# Patient Record
Sex: Female | Born: 1955 | Race: Black or African American | Hispanic: No | Smoking: Former smoker
Health system: Southern US, Community
[De-identification: ages and names within clinical notes are randomized; demographics above are authoritative.]

## PROBLEM LIST (undated history)

## (undated) DIAGNOSIS — E785 Hyperlipidemia, unspecified: Secondary | ICD-10-CM

## (undated) DIAGNOSIS — M5430 Sciatica, unspecified side: Secondary | ICD-10-CM

## (undated) DIAGNOSIS — D649 Anemia, unspecified: Secondary | ICD-10-CM

## (undated) DIAGNOSIS — I10 Essential (primary) hypertension: Secondary | ICD-10-CM

## (undated) DIAGNOSIS — M545 Low back pain, unspecified: Secondary | ICD-10-CM

## (undated) DIAGNOSIS — M533 Sacrococcygeal disorders, not elsewhere classified: Secondary | ICD-10-CM

## (undated) DIAGNOSIS — K5090 Crohn's disease, unspecified, without complications: Secondary | ICD-10-CM

## (undated) DIAGNOSIS — M961 Postlaminectomy syndrome, not elsewhere classified: Secondary | ICD-10-CM

## (undated) DIAGNOSIS — K559 Vascular disorder of intestine, unspecified: Secondary | ICD-10-CM

## (undated) DIAGNOSIS — M5417 Radiculopathy, lumbosacral region: Secondary | ICD-10-CM

## (undated) DIAGNOSIS — Z5189 Encounter for other specified aftercare: Secondary | ICD-10-CM

## (undated) HISTORY — DX: Postlaminectomy syndrome, not elsewhere classified: M96.1

## (undated) HISTORY — PX: REDUCTION MAMMAPLASTY: SUR839

## (undated) HISTORY — DX: Low back pain: M54.5

## (undated) HISTORY — PX: ABDOMINAL HYSTERECTOMY: SHX81

## (undated) HISTORY — DX: Anemia, unspecified: D64.9

## (undated) HISTORY — PX: BREAST BIOPSY: SHX20

## (undated) HISTORY — DX: Radiculopathy, lumbosacral region: M54.17

## (undated) HISTORY — DX: Encounter for other specified aftercare: Z51.89

## (undated) HISTORY — PX: SPINE SURGERY: SHX786

## (undated) HISTORY — DX: Hyperlipidemia, unspecified: E78.5

## (undated) HISTORY — DX: Sacrococcygeal disorders, not elsewhere classified: M53.3

## (undated) HISTORY — PX: CARDIAC CATHETERIZATION: SHX172

## (undated) HISTORY — DX: Essential (primary) hypertension: I10

## (undated) HISTORY — DX: Sciatica, unspecified side: M54.30

## (undated) HISTORY — DX: Crohn's disease, unspecified, without complications: K50.90

## (undated) HISTORY — DX: Vascular disorder of intestine, unspecified: K55.9

## (undated) HISTORY — DX: Low back pain, unspecified: M54.50

---

## 1980-06-20 HISTORY — PX: ABDOMINAL HYSTERECTOMY: SHX81

## 1982-06-20 HISTORY — PX: BREAST REDUCTION SURGERY: SHX8

## 1990-06-20 HISTORY — PX: OTHER SURGICAL HISTORY: SHX169

## 1997-10-03 ENCOUNTER — Emergency Department (HOSPITAL_COMMUNITY): Admission: EM | Admit: 1997-10-03 | Discharge: 1997-10-03 | Payer: Self-pay | Admitting: Emergency Medicine

## 1997-10-21 ENCOUNTER — Ambulatory Visit (HOSPITAL_COMMUNITY): Admission: RE | Admit: 1997-10-21 | Discharge: 1997-10-21 | Payer: Self-pay | Admitting: Cardiology

## 1997-11-03 ENCOUNTER — Emergency Department (HOSPITAL_COMMUNITY): Admission: EM | Admit: 1997-11-03 | Discharge: 1997-11-03 | Payer: Self-pay | Admitting: *Deleted

## 2003-05-18 ENCOUNTER — Other Ambulatory Visit: Payer: Self-pay

## 2005-04-06 ENCOUNTER — Ambulatory Visit: Payer: Self-pay | Admitting: Internal Medicine

## 2005-06-03 ENCOUNTER — Encounter: Payer: Self-pay | Admitting: Internal Medicine

## 2005-09-22 ENCOUNTER — Ambulatory Visit: Payer: Self-pay | Admitting: Internal Medicine

## 2006-01-28 ENCOUNTER — Emergency Department (HOSPITAL_COMMUNITY): Admission: EM | Admit: 2006-01-28 | Discharge: 2006-01-28 | Payer: Self-pay | Admitting: Emergency Medicine

## 2006-03-12 ENCOUNTER — Emergency Department: Payer: Self-pay | Admitting: Internal Medicine

## 2006-04-21 ENCOUNTER — Emergency Department (HOSPITAL_COMMUNITY): Admission: EM | Admit: 2006-04-21 | Discharge: 2006-04-21 | Payer: Self-pay | Admitting: Emergency Medicine

## 2006-05-28 ENCOUNTER — Emergency Department: Payer: Self-pay | Admitting: Emergency Medicine

## 2006-06-25 ENCOUNTER — Emergency Department: Payer: Self-pay | Admitting: General Practice

## 2006-07-18 ENCOUNTER — Emergency Department (HOSPITAL_COMMUNITY): Admission: EM | Admit: 2006-07-18 | Discharge: 2006-07-18 | Payer: Self-pay | Admitting: Emergency Medicine

## 2006-09-04 ENCOUNTER — Inpatient Hospital Stay (HOSPITAL_COMMUNITY): Admission: RE | Admit: 2006-09-04 | Discharge: 2006-09-09 | Payer: Self-pay | Admitting: Orthopaedic Surgery

## 2006-09-20 ENCOUNTER — Ambulatory Visit: Admission: RE | Admit: 2006-09-20 | Discharge: 2006-09-20 | Payer: Self-pay | Admitting: Orthopaedic Surgery

## 2007-03-29 ENCOUNTER — Encounter: Admission: RE | Admit: 2007-03-29 | Discharge: 2007-03-29 | Payer: Self-pay | Admitting: Orthopaedic Surgery

## 2007-07-18 ENCOUNTER — Inpatient Hospital Stay (HOSPITAL_COMMUNITY): Admission: RE | Admit: 2007-07-18 | Discharge: 2007-07-23 | Payer: Self-pay | Admitting: Orthopaedic Surgery

## 2007-11-05 ENCOUNTER — Encounter: Admission: RE | Admit: 2007-11-05 | Discharge: 2007-12-24 | Payer: Self-pay | Admitting: Orthopaedic Surgery

## 2008-01-09 ENCOUNTER — Encounter: Admission: RE | Admit: 2008-01-09 | Discharge: 2008-03-11 | Payer: Self-pay | Admitting: Orthopaedic Surgery

## 2008-07-24 ENCOUNTER — Encounter: Admission: RE | Admit: 2008-07-24 | Discharge: 2008-07-24 | Payer: Self-pay | Admitting: Orthopaedic Surgery

## 2008-08-21 ENCOUNTER — Ambulatory Visit: Payer: Self-pay | Admitting: Internal Medicine

## 2008-08-21 ENCOUNTER — Inpatient Hospital Stay (HOSPITAL_COMMUNITY): Admission: EM | Admit: 2008-08-21 | Discharge: 2008-08-22 | Payer: Self-pay | Admitting: *Deleted

## 2008-08-21 ENCOUNTER — Encounter (INDEPENDENT_AMBULATORY_CARE_PROVIDER_SITE_OTHER): Payer: Self-pay | Admitting: *Deleted

## 2008-08-22 ENCOUNTER — Encounter: Payer: Self-pay | Admitting: Internal Medicine

## 2008-09-02 ENCOUNTER — Ambulatory Visit: Payer: Self-pay | Admitting: Internal Medicine

## 2008-09-02 DIAGNOSIS — M129 Arthropathy, unspecified: Secondary | ICD-10-CM | POA: Insufficient documentation

## 2008-09-02 DIAGNOSIS — K559 Vascular disorder of intestine, unspecified: Secondary | ICD-10-CM | POA: Insufficient documentation

## 2008-09-02 DIAGNOSIS — I10 Essential (primary) hypertension: Secondary | ICD-10-CM | POA: Insufficient documentation

## 2008-09-02 DIAGNOSIS — K5090 Crohn's disease, unspecified, without complications: Secondary | ICD-10-CM

## 2008-12-26 ENCOUNTER — Encounter
Admission: RE | Admit: 2008-12-26 | Discharge: 2009-03-26 | Payer: Self-pay | Admitting: Physical Medicine & Rehabilitation

## 2008-12-30 ENCOUNTER — Ambulatory Visit: Payer: Self-pay | Admitting: Physical Medicine & Rehabilitation

## 2009-02-05 ENCOUNTER — Ambulatory Visit: Payer: Self-pay | Admitting: Physical Medicine & Rehabilitation

## 2009-03-06 ENCOUNTER — Ambulatory Visit: Payer: Self-pay | Admitting: Physical Medicine & Rehabilitation

## 2009-03-17 ENCOUNTER — Ambulatory Visit: Payer: Self-pay | Admitting: Physical Medicine & Rehabilitation

## 2009-04-01 ENCOUNTER — Encounter
Admission: RE | Admit: 2009-04-01 | Discharge: 2009-06-11 | Payer: Self-pay | Admitting: Physical Medicine & Rehabilitation

## 2009-04-03 ENCOUNTER — Ambulatory Visit: Payer: Self-pay | Admitting: Physical Medicine & Rehabilitation

## 2009-05-01 ENCOUNTER — Ambulatory Visit: Payer: Self-pay | Admitting: Physical Medicine & Rehabilitation

## 2009-05-28 ENCOUNTER — Ambulatory Visit: Payer: Self-pay | Admitting: Physical Medicine & Rehabilitation

## 2009-06-24 ENCOUNTER — Encounter
Admission: RE | Admit: 2009-06-24 | Discharge: 2009-09-22 | Payer: Self-pay | Admitting: Physical Medicine & Rehabilitation

## 2009-06-25 ENCOUNTER — Ambulatory Visit: Payer: Self-pay | Admitting: Physical Medicine & Rehabilitation

## 2009-07-22 ENCOUNTER — Ambulatory Visit: Payer: Self-pay | Admitting: Physical Medicine & Rehabilitation

## 2009-08-19 ENCOUNTER — Ambulatory Visit: Payer: Self-pay | Admitting: Physical Medicine & Rehabilitation

## 2009-09-18 ENCOUNTER — Encounter
Admission: RE | Admit: 2009-09-18 | Discharge: 2009-12-17 | Payer: Self-pay | Admitting: Physical Medicine & Rehabilitation

## 2009-09-18 ENCOUNTER — Emergency Department (HOSPITAL_COMMUNITY): Admission: EM | Admit: 2009-09-18 | Discharge: 2009-09-18 | Payer: Self-pay | Admitting: Emergency Medicine

## 2009-09-22 ENCOUNTER — Ambulatory Visit: Payer: Self-pay | Admitting: Physical Medicine & Rehabilitation

## 2009-10-12 ENCOUNTER — Ambulatory Visit (HOSPITAL_COMMUNITY)
Admission: RE | Admit: 2009-10-12 | Discharge: 2009-10-12 | Payer: Self-pay | Admitting: Physical Medicine & Rehabilitation

## 2009-10-23 ENCOUNTER — Ambulatory Visit: Payer: Self-pay | Admitting: Physical Medicine & Rehabilitation

## 2009-11-12 ENCOUNTER — Ambulatory Visit: Payer: Self-pay | Admitting: Physical Medicine & Rehabilitation

## 2009-12-14 ENCOUNTER — Encounter
Admission: RE | Admit: 2009-12-14 | Discharge: 2010-03-14 | Payer: Self-pay | Admitting: Physical Medicine & Rehabilitation

## 2009-12-22 ENCOUNTER — Ambulatory Visit: Payer: Self-pay | Admitting: Physical Medicine & Rehabilitation

## 2009-12-31 ENCOUNTER — Encounter: Admission: RE | Admit: 2009-12-31 | Discharge: 2009-12-31 | Payer: Self-pay | Admitting: Orthopaedic Surgery

## 2010-01-13 ENCOUNTER — Observation Stay (HOSPITAL_COMMUNITY): Admission: RE | Admit: 2010-01-13 | Discharge: 2010-01-14 | Payer: Self-pay | Admitting: Orthopaedic Surgery

## 2010-01-20 ENCOUNTER — Ambulatory Visit: Payer: Self-pay | Admitting: Physical Medicine & Rehabilitation

## 2010-02-17 ENCOUNTER — Ambulatory Visit: Payer: Self-pay | Admitting: Physical Medicine & Rehabilitation

## 2010-03-17 ENCOUNTER — Encounter
Admission: RE | Admit: 2010-03-17 | Discharge: 2010-06-15 | Payer: Self-pay | Source: Home / Self Care | Attending: Physical Medicine & Rehabilitation | Admitting: Physical Medicine & Rehabilitation

## 2010-03-23 ENCOUNTER — Ambulatory Visit: Payer: Self-pay | Admitting: Physical Medicine & Rehabilitation

## 2010-04-21 ENCOUNTER — Ambulatory Visit: Payer: Self-pay | Admitting: Physical Medicine & Rehabilitation

## 2010-05-19 ENCOUNTER — Ambulatory Visit: Payer: Self-pay | Admitting: Physical Medicine & Rehabilitation

## 2010-06-25 ENCOUNTER — Encounter
Admission: RE | Admit: 2010-06-25 | Discharge: 2010-06-29 | Payer: Self-pay | Source: Home / Self Care | Attending: Physical Medicine & Rehabilitation | Admitting: Physical Medicine & Rehabilitation

## 2010-06-29 ENCOUNTER — Ambulatory Visit
Admission: RE | Admit: 2010-06-29 | Discharge: 2010-06-29 | Payer: Self-pay | Source: Home / Self Care | Attending: Physical Medicine & Rehabilitation | Admitting: Physical Medicine & Rehabilitation

## 2010-08-02 ENCOUNTER — Encounter: Payer: Medicare Other | Attending: Physical Medicine & Rehabilitation

## 2010-08-02 ENCOUNTER — Ambulatory Visit: Payer: Medicare Other | Admitting: Physical Medicine & Rehabilitation

## 2010-08-02 DIAGNOSIS — M533 Sacrococcygeal disorders, not elsewhere classified: Secondary | ICD-10-CM

## 2010-08-02 DIAGNOSIS — Z981 Arthrodesis status: Secondary | ICD-10-CM | POA: Insufficient documentation

## 2010-08-05 NOTE — Procedures (Signed)
NAMESHAMBRIA, CAMERER            ACCOUNT NO.:  [id number]  MEDICAL RECORD NO.:  [id number]           PATIENT TYPE:  LOCATION:                                 FACILITY:  PHYSICIAN:  Erick Colace, M.D.DATE OF BIRTH:  12-22-1955  DATE OF PROCEDURE: DATE OF DISCHARGE:                              OPERATIVE REPORT  SACROILIAC INJECTION UNDER FLUOROSCOPIC GUIDANCE  INDICATION:  Right sacroiliac disorder, status post lumbar fusion.  The patient has had previous good success with this injection last performed on February 05, 2009.  She has had exacerbation of right buttock pain which was not relieved with trochanteric bursa injection.  Informed consent was obtained after describing risks and benefits of the procedure with the patient.  These include bleeding, bruising, and infection.  She elects to proceed and has given written consent.  The patient was placed prone on fluoroscopy table, Betadine prep, sterile drape, 25-gauge inch and half needle was used to anesthetize the skin and subcu tissue, 1% lidocaine x2 mL.  Then, a 25 gauge 3.5-inch spinal needle was inserted into right SI joint, AP, lateral, oblique imaging. Omnipaque 180 under live fluoro demonstrated no intravascular uptake followed by injection of 1 mL of 2% lidocaine plus 0.5 ML of 40 mg/mL Depo-Medrol.  The patient tolerated the procedure well.  Postprocedure instructions were given.  She will see me back in 1 month followup visit.  We will discuss medication management should this not prove to be successful.     Erick Colace, M.D. Electronically Signed    AEK/MEDQ  D:  08/02/2010 10:35:01  T:  08/02/2010 20:06:09  Job:  [id number]

## 2010-09-03 ENCOUNTER — Encounter: Payer: Medicare Other | Attending: Physical Medicine & Rehabilitation

## 2010-09-03 ENCOUNTER — Ambulatory Visit: Payer: Medicare Other | Admitting: Physical Medicine & Rehabilitation

## 2010-09-03 DIAGNOSIS — M533 Sacrococcygeal disorders, not elsewhere classified: Secondary | ICD-10-CM

## 2010-09-03 DIAGNOSIS — Z981 Arthrodesis status: Secondary | ICD-10-CM | POA: Insufficient documentation

## 2010-09-03 DIAGNOSIS — M961 Postlaminectomy syndrome, not elsewhere classified: Secondary | ICD-10-CM

## 2010-09-04 LAB — DIFFERENTIAL
Eosinophils Relative: 2 % (ref 0–5)
Lymphocytes Relative: 36 % (ref 12–46)
Lymphs Abs: 1.6 10*3/uL (ref 0.7–4.0)
Monocytes Absolute: 0.3 10*3/uL (ref 0.1–1.0)

## 2010-09-04 LAB — CBC
MCH: 28.5 pg (ref 26.0–34.0)
MCHC: 32.8 g/dL (ref 30.0–36.0)
MCV: 86.9 fL (ref 78.0–100.0)
Platelets: 234 10*3/uL (ref 150–400)
RDW: 13.6 % (ref 11.5–15.5)
WBC: 4.5 10*3/uL (ref 4.0–10.5)

## 2010-09-04 LAB — COMPREHENSIVE METABOLIC PANEL
ALT: 20 U/L (ref 0–35)
Albumin: 4.1 g/dL (ref 3.5–5.2)
Alkaline Phosphatase: 52 U/L (ref 39–117)
BUN: 9 mg/dL (ref 6–23)
CO2: 24 mEq/L (ref 19–32)
Calcium: 9.3 mg/dL (ref 8.4–10.5)
Chloride: 105 mEq/L (ref 96–112)
GFR calc Af Amer: >60 mL/min (ref >60)
GFR calc non Af Amer: >60 mL/min (ref >60)
Potassium: 3.1 mEq/L — ABNORMAL LOW (ref 3.5–5.1)
Sodium: 137 mEq/L (ref 135–145)
Total Bilirubin: 0.7 mg/dL (ref 0.3–1.2)

## 2010-09-04 LAB — SURGICAL PCR SCREEN: MRSA, PCR: NEGATIVE

## 2010-09-30 LAB — CBC
HCT: 39.7 % (ref 36.0–46.0)
Hemoglobin: 13.4 g/dL (ref 12.0–15.0)
Platelets: 223 10*3/uL (ref 150–400)
Platelets: ADEQUATE 10*3/uL (ref 150–400)
RDW: 13.5 % (ref 11.5–15.5)
WBC: 12.5 10*3/uL — ABNORMAL HIGH (ref 4.0–10.5)
WBC: 16 10*3/uL — ABNORMAL HIGH (ref 4.0–10.5)

## 2010-09-30 LAB — URINALYSIS, ROUTINE W REFLEX MICROSCOPIC
Bilirubin Urine: NEGATIVE
Glucose, UA: NEGATIVE mg/dL
Ketones, ur: NEGATIVE mg/dL
Protein, ur: NEGATIVE mg/dL
pH: 6.5 (ref 5.0–8.0)

## 2010-09-30 LAB — GIARDIA/CRYPTOSPORIDIUM SCREEN(EIA)
Cryptosporidium Screen (EIA): NEGATIVE
Giardia Screen - EIA: NEGATIVE

## 2010-09-30 LAB — COMPREHENSIVE METABOLIC PANEL
ALT: 14 U/L (ref 0–35)
Albumin: 4 g/dL (ref 3.5–5.2)
Alkaline Phosphatase: 49 U/L (ref 39–117)
BUN: 13 mg/dL (ref 6–23)
Chloride: 99 mEq/L (ref 96–112)
Glucose, Bld: 119 mg/dL — ABNORMAL HIGH (ref 70–99)
Potassium: 3.7 mEq/L (ref 3.5–5.1)
Sodium: 139 mEq/L (ref 135–145)
Total Bilirubin: 0.4 mg/dL (ref 0.3–1.2)

## 2010-09-30 LAB — STOOL CULTURE

## 2010-09-30 LAB — PHOSPHORUS: Phosphorus: 3.6 mg/dL (ref 2.3–4.6)

## 2010-09-30 LAB — BASIC METABOLIC PANEL
BUN: 6 mg/dL (ref 6–23)
Creatinine, Ser: 0.88 mg/dL (ref 0.4–1.2)
GFR calc non Af Amer: >60 mL/min (ref >60)
Glucose, Bld: 152 mg/dL — ABNORMAL HIGH (ref 70–99)

## 2010-09-30 LAB — MAGNESIUM: Magnesium: 2.1 mg/dL (ref 1.5–2.5)

## 2010-09-30 LAB — DIFFERENTIAL
Band Neutrophils: 0 % (ref 0–10)
Basophils Absolute: 0 10*3/uL (ref 0.0–0.1)
Basophils Relative: 0 % (ref 0–1)
Eosinophils Absolute: 0 10*3/uL (ref 0.0–0.7)
Eosinophils Relative: 0 % (ref 0–5)
Lymphocytes Relative: 7 % — ABNORMAL LOW (ref 12–46)
Lymphs Abs: 0.8 10*3/uL (ref 0.7–4.0)
Metamyelocytes Relative: 0 %
Myelocytes: 0 %
Neutrophils Relative %: 81 % — ABNORMAL HIGH (ref 43–77)
Neutrophils Relative %: 92 % — ABNORMAL HIGH (ref 43–77)
Promyelocytes Absolute: 0 %

## 2010-09-30 LAB — SEDIMENTATION RATE: Sed Rate: 16 mm/hr (ref 0–22)

## 2010-09-30 LAB — PROTIME-INR
INR: 1 (ref 0.00–1.49)
Prothrombin Time: 13.4 seconds (ref 11.6–15.2)

## 2010-09-30 LAB — LIPASE, BLOOD: Lipase: 23 U/L (ref 11–59)

## 2010-09-30 LAB — TSH: TSH: 0.622 u[IU]/mL (ref 0.350–4.500)

## 2010-09-30 LAB — APTT: aPTT: 31 seconds (ref 24–37)

## 2010-10-18 ENCOUNTER — Ambulatory Visit: Payer: Medicare Other | Admitting: Physical Medicine & Rehabilitation

## 2010-10-18 ENCOUNTER — Encounter: Payer: Medicare Other | Attending: Physical Medicine & Rehabilitation

## 2010-10-18 DIAGNOSIS — Z981 Arthrodesis status: Secondary | ICD-10-CM | POA: Insufficient documentation

## 2010-10-18 DIAGNOSIS — M961 Postlaminectomy syndrome, not elsewhere classified: Secondary | ICD-10-CM

## 2010-10-18 DIAGNOSIS — M545 Low back pain: Secondary | ICD-10-CM

## 2010-10-18 DIAGNOSIS — M533 Sacrococcygeal disorders, not elsewhere classified: Secondary | ICD-10-CM | POA: Insufficient documentation

## 2010-10-18 NOTE — Procedures (Signed)
[name] [name], [name] [name]            ACCOUNT NO.:  [id number]  MEDICAL RECORD NO.:  [id number]           PATIENT TYPE:  O  LOCATION:  [facility]PC                          FACILITY:  [facility]  PHYSICIAN:  Erick Colace, M.D.DATE OF BIRTH:  12-22-1955  DATE OF PROCEDURE: DATE OF DISCHARGE:                              OPERATIVE REPORT  PROCEDURE:  L5-S1, S2-S3 dorsal ramus radiofrequency neurotomy.  INDICATION:  Sacroiliac disorder, lumbar post laminectomy syndrome, chronic lumbar pain.  Informed consent was obtained after describing the risks and benefits of the procedure with patient.  These include bleeding, bruising, and infection.  She elects to proceed and has given written consent.  She has had good temporary relief with SI injection x2.  Informed consent was obtained after describing risks and benefits.  The patient placed in the prone position.  Area marked and prepped with Betadine, entered with 25-gauge inch and half needle, 1 mL of 1% lidocaine injected into each of four sites.  Then a 20-gauge 10-cm RF needle with 10-mm curved active tip was inserted under fluoroscopic guidance, targeting right S1 SAP sacral ala junction, bone contact made, confirmed with lateral imaging.  Sensory stem at 50 Hz followed by motor stem at 2 Hz confirmed proper needle location followed by injection of 1 mL of solution containing 1 mL of 4 mg/mL dexamethasone and 4 mL of 1% lidocaine.  Then the right S1 the lateral aspect of the foramen was approximated by needle tip.  Sensory testing performed at 50 Hz, 1 mL of dexamethasone lidocaine solution was injected followed by radiofrequency lesioning 70 degrees and 70 seconds.  Then the right S2 lateral aspect of the foramen was approximated by needle tip.  Sensory stem at 50 Hz followed by injection of 1 mL of the dexamethasone lidocaine solution and radiofrequency lesioning 70 degrees for 70 seconds.  Then the right S3 lateral aspect of the foramen was  approximated by a needle tip. Sensory stem performed at 50 Hz followed by injection of 1 mL of dexamethasone lidocaine solution and radiofrequency lesioning 70 degrees Celsius for 70 seconds.  The patient tolerated procedure well. Postprocedure instructions given.  We will see her back in about 6 weeks.  Medications written.  We will give Neurontin 1800 mg tonight.     Erick Colace, M.D. Electronically Signed    AEK/MEDQ  D:  10/18/2010 10:11:01  T:  10/18/2010 22:07:54  Job:  [id number]

## 2010-11-02 NOTE — Op Note (Signed)
NAMENEALA, [name] [name]            ACCOUNT NO.:  [id number]   MEDICAL RECORD NO.:  [id number]          PATIENT TYPE:  INP   LOCATION:  3006                         FACILITY:  [facility]   PHYSICIAN:  Mark C. Ophelia Charter, M.D.    DATE OF BIRTH:  07/18/1955   DATE OF PROCEDURE:  07/18/2007  DATE OF DISCHARGE:  07/23/2007                               OPERATIVE REPORT   This is a re-dictation of the operative note, the previous note was  dictated but has not been found in the system.   PREOPERATIVE DIAGNOSIS:  Status post L3-L4 and L4-L5 TLIF with  pseudoarthrosis and some cage back out.   POSTOPERATIVE DIAGNOSIS:  Status post L3-L4 and L4-L5 TLIF with  pseudoarthrosis and some cage back out.   PROCEDURE:  Revision TLIF right L3-L4 and L4-L5.   SURGEON:  Mark C. Ophelia Charter, M.D.   ASSISTANT:  Wende Neighbors, P.A.-C.   ANESTHESIA:  GOT.   ESTIMATED BLOOD LOSS:  650 mL.   HISTORY:  This patient has had a previous TLIF procedure.  She has worn  her brace as expected but has had some increased right leg pain and  serial x-rays show that the tiny BB type marker that is impregnated in  the plastic PEEK cage had showed back up and myelogram CT scan showed  that cage had backed up and was causing some nerve root compression with  increased pain.  She is brought in at this time for repositioning to  relieve her from the compression.   PROCEDURE IN DETAIL:  After the induction of general anesthesia with  orotracheal intubation, the patient was placed prone on the [place]  frame with the spinal frame.  Careful padding and positioning, prepping  and preoperative antibiotics.  Time out procedure was confirmed. A  sterile skin marker was used on the old incision and a Betadine Vidrape.  Arms were checked to make sure they were at the appropriate level.  Shoulders were abducted at 90 and elbows at 90 with the axillary fold  anterior to the shoulder was yellow pads rolled.  An old incision was  made.   Dissection down to hardware was performed out to the side.  Once  hardware was identified, dissection medially along the edge of the canal  was performed, nerve root and cages were identified.  The cages had  backed up and at this point, caps were removed on all sides.  The rod  was removed and there was distraction applied.  The cage was attempted  to be removed but the cages would not back out, whatsoever.  Trying to  go around the cages with an osteotome was performed and it would loosen  some, however, the teeth of the cage were designed to prevent back out  and although it had backed up some, it could not be removed.  With  efforts to remove the cage, the tiny device that screws into the middle  of the cage and small hole on the side which is threaded and is attached  to a flat pack was attempted to be backed up and it broke off.  The  metal was just underneath the edge of the surface of the cage, it was  not protruding and to remove it  would require a high speed drill bit  around the cage.  It was tightened and tapped down and it was secure.  At this point, it was apparent that the possibility of removing the cage  would cause extensive damage.  With distraction applied on both sides  between the pedicle screws, the cage was advanced forward.  Once it was  advanced in adequate position, checked under fluoroscopy, and under  direct visualization was out of the way, an identical procedure was  repeated at the other cage.  Rods were appropriately contoured and were  placed across.  A link here was placed.  AP and lateral fluoroscopic  pictures were taken after all caps had been placed, tightening down the  rods first on the sides prior to tightening the caps for compression.  The cages were checked again and were in excellent position.  The nerve  root was decompressed.  Foraminotomy was performed.  Bone graft was  inspected and additional graft was placed out laterally.  After a   careful irrigation and final spot pictures were taken, the wound was  essentially closed with subcuticular closure.  The patient tolerated the  procedure well.  It was noted in the recovery room that she had some arm  numbness of the right arm which had the tourniquet on it and also had  been carefully padded and positioned.  It was global weakness, mild,  inserted from the mid humeral level distal.  The whole hand had  decreased sensation, EPL, EDC, FDP, FDS, wrist flexion/extension, were  all 5/5 with motor strength testing.  I discussed with the patient in  the recovery room the broken extractor piece that was in the cage and  discussed with her it was tapped down threaded and would not back out  and did not require destruction of the cage in order to remove it.  Neurologic exam of the lower extremities was intact postoperatively and  good relief of her preop pain from pressure from the cage.      Mark C. Ophelia Charter, M.D.  Electronically Signed     MCY/MEDQ  D:  09/05/2007  T:  09/05/2007  Job:  [id number]

## 2010-11-02 NOTE — Procedures (Signed)
[name] [name], [name] [name]            ACCOUNT NO.:  [id number]   MEDICAL RECORD NO.:  [id number]           PATIENT TYPE:   LOCATION:                                 FACILITY:   PHYSICIAN:  Erick Colace, M.D.DATE OF BIRTH:  1956-02-21   DATE OF PROCEDURE:  DATE OF DISCHARGE:                               OPERATIVE REPORT   This is a right sacroiliac injection under fluoroscopic guidance.   INDICATIONS:  Right hip and buttock pain.  She has some relief with  troch bursa injection; however, she has had persistent pain, which  interferes with activity and is graded as 7/10 interfering with  household duties and shopping.   Informed consent was obtained after describing risks and benefits of the  injection with the patient, these include bleeding, bruising, infection.  She elects to proceed and has given written consent.  The patient placed  prone on fluoroscopy table.  Betadine prep, sterile drape, 25-gauge inch  and half needle was used to anesthetize skin and subcu tissue 1%  lidocaine x2 ccs and a 25-gauge 3-inch spinal needle was inserted under  fluoroscopic guidance charting the right SI joint.  AP and lateral  oblique images utilized, Omnipaque 180 and live fluoro demonstrated no  intravascular uptake, then 0.5 mL of 40 mg/mL Depo-Medrol and 1 mL 2%  MPF lidocaine injected.  The patient tolerated procedure well.  Pre and  post injection vitals stable.  Post injection instructions given.      Erick Colace, M.D.  Electronically Signed     AEK/MEDQ  D:  02/05/2009 13:06:54  T:  02/06/2009 00:42:19  Job:  [id number]

## 2010-11-02 NOTE — Op Note (Signed)
[name] [name], [name] [name]            ACCOUNT NO.:  [id number]   MEDICAL RECORD NO.:  [id number]          PATIENT TYPE:  INP   LOCATION:  2899                         FACILITY:  [facility]   PHYSICIAN:  Mark C. Ophelia Charter, M.D.    DATE OF BIRTH:  04-21-1956   DATE OF PROCEDURE:  07/18/2007  DATE OF DISCHARGE:                               OPERATIVE REPORT   PRE AND POSTOPERATIVE DIAGNOSIS:  Status post L3-4, L4-5 transforaminal  lumbar interbody fusion with pseudoarthrosis and cage back up with dural  and nerve root compression.   PROCEDURE:  Revision transforaminal lumbar interbody fusion L3-4, L4-5.  Rod cap exchange replacement of cages and recompression, bone  morphogenic protein lateral.Exploration of lumbar fusion.   SURGEON:  Annell Greening, MD.   ASSISTANT:  Maud Deed, PA-C   ESTIMATED BLOOD LOSS:  650 mL with retransfusion, Cell Saver 300.   DRAINS:  One Hemovac.   This 55 year old female has had previous L3-4, L4-5 TLIF for  degenerative spinal stenosis, foraminal stenosis.  Postoperatively one  of the caps popped off at the right L5 pedicle screw but the rod stable  within the slot in satisfactory position.  She was maintained in her  brace for four  months; however, with time she has had gradual migration  of her cage more at the 4-5 than the 3-4 level.  On the right side the  cage is backed up, was causing dural compression with increasing pain.  Myelo-CT scan showed that there was adequate bone anterior to cages  backed up more so at the 4-5 level than at the 3-4 level with some cage  backup, one of pieces of bone graft was pressed against the nerve root  which was not present in the immediate postop period.  She did well  postop with great relief of pain for a month and a half and then with  cage backup starting progressive increased pain.  She is brought back in  for either exchange or cages or reposition of cages to take care of the  nerve root compression.   PROCEDURE:   After induction of general anesthesia, orotracheal  intubation, the patient was placed on the Andrews frame with the spine  attachment, careful padding iliac crest, chest rolls, thigh pads,  pillows underneath the feet, anterior shoulder padding with the  shoulders and elbow at 90.  Back was prepped with DuraPrep.  Foley  catheter had been placed and 2 grams Ancef was given preoperatively.  Betadine Vi-drape and laminectomy sheet drape.  Time-out procedure was  taken and confirmed.  Antibiotics were in and the images were displayed.  Patient appropriate position.  Midline incision was made using the old  scar.  Dissection down to midline spinous process at L2 down at S1 was  performed and then out to the hardware laterally.  Chunks of soft tissue  were removed.  Hat band screw caps were loosened and removed.  Rod was  removed on the right and the cage at 4-5 was one that was causing most  significant compression.  It was exposed first identifying the pedicle  just below the  disk space, then follow up from the top of the pedicle to  the disk space identifying the nerve root above as it came underneath  the pedicle with careful protection.  Cage was identified, was  prominent, was sticking up and compressing the dura.  The dura, with  operating microscope was gently mobilized and carefully peeled off the  cage.  As the D'Errico was being placed slightly medial, dural tear  occurred anterior and patties were used to protect the dura.  With  distractor applied, on the right side, the PEEK cage remover was  attached to the small hole which is a tap-like device inserted down into  the PEEK cage and then I gently tried to tap it out with dura protected.  The remover broke off flush with the PEEK cage.  The cage would not back  out any and an osteotome had been used above and below the cage next to  the end plate prior to attempt to remove it.  A Kocher was then inserted  on  it and the patient  could almost be lifted up off the table with  motion at the __________  with attempts to wiggle the cage were  unsuccessful.  At this point it was apparent the cage was not coming  out.  The rod inserted metal piece could be taken out by burring away  the PEEK although this would cause metal debris and heat adjacent to the  dura.  It was snug inside the PEEK which expands as the tap is inserted  and at this point rather than exchanging the cage, the cage inserter was  used to impact the cage further until it was countersunk 5 to 6 mm,  checked on fluoroscopy, confirming it was in excellent position.  Identical procedure was then repeated at the level above with exposure  of the cage.  There was a piece of bone posterior to the cage which was  part of the bone graft that had worked around the cage which was removed  and the cage was angled but was just flush with the posterior cortex and  thin osteotome was placed above and below the cage gently and then the  cage was impacted, countersinking it tightly.  It also was tight and not  able to be removed.  Dura was intact at this level.  Some Tisseel was  mixed and squirted at the 4-5 level with the operative microscope and  the operative field was dry.  At the 3-4 level with the cage  repositioned AP and lateral fluoroscopy were used to confirm position of  the cage looking at three tiny dots that are in the cage, one above and  two below at the lower level both posterior dots were parallel and the  anterior aspect was just past the midline or midbody position posterior  to anterior with identical position at 3-4 and on AP x-ray both cages  were exactly midline.  Identical length rods were reinserted Biomet  Polaris and the right cage side was tightened first tightening down the  top first and then compressing the other two levels.  The opposite rod  was then compressed tightening the top and then using the rod compressor  to tighten down the  screws.  All six screw caps were carefully inspected  and were tight, all tightened to 100 pounds when they clicked and were  checked with a Glorious Peach to make sure that they were intact.  Next, the  migrated rod  cap which was at the midline was checked out on  fluoroscopy, localized with tonsil clamp.  The lamina below was  identified from the depth of the dura and then careful directly over the  cap was performed.  Once the cap was identified blue micro nerve hook  was placed underneath it as black nerve hook was gently brought around  cleaning off soft tissue until it popped out and was removed.  Final x-  rays were taken which showed good position of the cages, rods tightened  down.  BMP small kit was then cut into four pieces and one piece was  placed on the right at 3-4, right at 4-5 and then on the left at 3-4 and  left at 4-5.  Bone laterally had been inspected and on the right at 4-5  there was just trace motion.  3-4 appeared to be mobile with pressure  and on the left side there was abundant bone graft lateral where the BMP  was placed.  There was trace motion on the left side.  Cross connector  was reapplied large tightened down until it clicked.  Fascia was closed  0-0 Vicryl. Hemovac in the subcutaneous tissue.  2-0 Vicryl in  subcutaneous tissue and skin closure.  Patient had bone stimulator  applied and was transferred to recovery in stable condition.  Instrument  count needle count was correct.      Mark C. Ophelia Charter, M.D.  Electronically Signed     MCY/MEDQ  D:  07/18/2007  T:  07/19/2007  Job:  [id number]

## 2010-11-02 NOTE — Procedures (Signed)
[name] [name], [name] [name]            ACCOUNT NO.:  [id number]   MEDICAL RECORD NO.:  [id number]          PATIENT TYPE:  REC   LOCATION:  TPC                          FACILITY:  [facility]   PHYSICIAN:  Erick Colace, M.D.DATE OF BIRTH:  05/23/1956   DATE OF PROCEDURE:  DATE OF DISCHARGE:                               OPERATIVE REPORT   This is a right trochanteric bursa injection.   INDICATION:  Right trochanteric bursitis, pain only partially relieved  by medication management including narcotic analgesics.   Informed consent was obtained after describing risks and benefits of  procedure with the patient.  These include bleeding, bruising, or  infection.  She elected to proceed and has given a written consent.   The patient was placed in the left lateral decubitus position, area  marked and prepped with Betadine and alcohol, entered with 25-gauge 1-  1/2-inch needle after negative drawback for blood, 1 mL of 40 mg/mL Depo-  Medrol and 4 mL of 1% lidocaine injected.  The patient tolerated the  procedure well.  Pre and post injection vitals stable.  Post injection  instructions given.      Erick Colace, M.D.  Electronically Signed     AEK/MEDQ  D:  12/30/2008 16:45:48  T:  12/31/2008 08:07:09  Job:  [id number]

## 2010-11-02 NOTE — H&P (Signed)
[name] [name], TAKEDA            ACCOUNT NO.:  [id number]   MEDICAL RECORD NO.:  [id number]          PATIENT TYPE:  INP   LOCATION:  3729                         FACILITY:  [facility]   PHYSICIAN:  Lonia Blood, M.D.      DATE OF BIRTH:  [birth year]   DATE OF ADMISSION:  08/21/2008  DATE OF DISCHARGE:                              HISTORY & PHYSICAL   PRIMARY CARE PHYSICIAN:  Is in [facility].   PRESENTING COMPLAINTS:  Nausea, vomiting, abdominal pain.   HISTORY OF PRESENT ILLNESS:  The patient is a 55 year old [place]  American female with known history of Crohn disease diagnosed in 2006 at  [place].  The patient at that time was treated with steroid and  Remicade.  She has since been off treatment.  She apparently had been  doing alright until 2 days ago when she started having nausea, vomiting  and diarrhea.  Now, she has started having crampy abdominal pain.  The  diarrhea at some point was bloody and has persisted.  Hence, the patient  came to the emergency room.  Her abdominal pain is mainly in the  epigastric region.  No relation to meals and is crampy in nature.  She  had a similar episode when she first had her Crohn disease diagnosed  years ago.  Denied any fever no chills.  Denied any hematemesis.   PAST MEDICAL HISTORY:  1. Significant for Crohn disease.  2. Hypertension.  3. GERD.  4. Chronic bronchitis.  5. Severe degenerative disk disease status post L3-L4 and L4-L5 fusion      with migration cage in February 2009 when it was revised.   ALLERGIES:  She is allergic to ORUDIS.   MEDICATIONS INCLUDE:  1. Hydrochlorothiazide 25 mg daily.  2. Prempro 1 tablet daily.  3. Vicodin as needed.   SOCIAL HISTORY:  The patient lives in [place] with her partner.  She  denied tobacco, alcohol or IV drug use.   FAMILY HISTORY:  Mainly hypertension.   REVIEW OF SYSTEMS:  Fourteen-point review of systems is negative except  per HPI.   EXAMINATION:  Temperature is  98.7, blood pressure 130/56, pulse 56,  respiratory rate 18, saturation 100% on room air.  GENERAL:  The patient is awake, alert, oriented, in no acute distress.  HEENT:  PERRL.  EOMI.  NECK:  Supple.  No JVD, no lymphadenopathy.  RESPIRATORY:  She has good air entry bilaterally.  No wheezes or rales.  CARDIOVASCULAR SYSTEM:  The patient has S1-S2, no murmur.  ABDOMEN:  Soft, nontender, with positive bowel sounds.  EXTREMITIES:  No edema, cyanosis or clubbing.   Stool guaiac is positive.  Sodium is 139, potassium 3.7, chloride 99,  CO2 29, glucose 119, BUN 13, creatinine 0.85, calcium 9.8, total protein  7.6, albumin 4.0, AST 22, ALT 14.  Lipase is 23.  White count is 16,000,  hemoglobin 13.4 with an ANC of 89.3, platelets are clumps but counts  appeared adequate.  She had a left shift, neutrophils 81%.  CT abdomen  and pelvis shows no focal folliculosis or lymphadenopathy, scattered  atherosclerotic calcification, none in  the abdominal aorta.  Normal  terminal ileum also visualized.   ASSESSMENT:  Therefore, this is a 55 year old female with known history  of Crohn disease presenting with nausea, vomiting, diarrhea, and  findings consistent with possible flare of her Crohn's.   PLAN:  Therefore, will be:  1. Crohn disease flare:  Will admit the patient, start her on IV      steroids, bowel rest, pain control and IV fluids.  Once the      patient's symptoms improve, will get GI consult.  She may benefit      from EGD and colonoscopy specifically.  2. Nausea, vomiting, and diarrhea secondary to Crohn disease:  Will      treat that conservatively as indicated above.  3. Hypertension:  Will hold hydrochlorothiazide and, if needed, we      will put patient on some medications like beta blockers.  4. Gastroesophageal reflux disease:  I will continue PPI as IV.      Guaiac positive stools most likely from her Crohn flare.      Hemoglobin is stable, will continue to monitor hemoglobin.   5. History of degenerative disk disease:  Will continue with some pain      control as necessary.  Otherwise, further treatment will depend on      how the patient does in the hospital.      Lonia Blood, M.D.  Electronically Signed     LG/MEDQ  D:  08/21/2008  T:  08/21/2008  Job:  [id number]

## 2010-11-02 NOTE — Discharge Summary (Signed)
[name] [name], [name] [name]            ACCOUNT NO.:  [id number]   MEDICAL RECORD NO.:  [id number]          PATIENT TYPE:  INP   LOCATION:  3730                         FACILITY:  [facility]   PHYSICIAN:  [name] [name], M.D. DATE OF BIRTH:  December 30, 1955   DATE OF ADMISSION:  08/21/2008  DATE OF DISCHARGE:  08/22/2008                               DISCHARGE SUMMARY   DISCHARGE DIAGNOSES:  1. Hematochezia.  2. History of Crohn disease.  3. Hypertension.  4. Gastroesophageal reflux disease.   DISCHARGE MEDICATIONS:  1. Hydrochlorothiazide 25 mg daily.  2. Prempro 0.65 mg once daily.  3. Omeprazole 20 mg daily.  4. Vicodin 5/500 mg 1 tablet every 6 hours as needed for pain.  I have      dispensed #30 tablets.   DISPOSITION AND FOLLOWUP:  [name] [name] is discharged home in stable  condition.  Of note, she had a colonoscopy done, which biopsies are  still pending.  She has been scheduled to follow up with Dr. Lina [name]  on September 02, 2008, at 2:15 p.m. to follow up with the biopsy results..  The patient is also encouraged to call her PCP, Dr. Darliss [name] to  schedule an appointment within the next couple weeks as well.   CONSULTATION THIS HOSPITALIZATION:  Hedwig Morton. Juanda Chance, MD, with [facility].   IMAGES AND PROCEDURES PERFORMED DURING THIS HOSPITALIZATION:  A CT scan  of her abdomen and pelvis on August 21, 2008 that showed no acute findings  in the abdomen with no evidence of inflammatory bowel disease.  Status  post a lumbar effusion.  With no acute findings in the pelvis.  The  patient also had a colonoscopy on August 22, 2008 that showed colitis of  the splenic flexure with an otherwise normal examination.  No evidence  of active Crohn disease.  With a normal-looking rectum, no blood  throughout the lumen of the colon.   HISTORY AND PHYSICAL EXAMINATION:  For full details, please refer to  dictation by Dr. Mikeal Hawthorne on August 21, 2008, but in brief, [name] [name] is a  pleasant 55 year old African  American woman.  With a history of Crohn  disease diagnosed in 2006 followed at [facility], it appears that  one time she had been on Remicade.  She is currently not on any  treatment.  She was doing okay until 2 days prior to admission when she  started experiencing nausea, vomiting, and diarrhea as well as crampy  abdominal pain, the diarrhea turned bloody and persisted.  For that  reason, she came into the hospital for evaluation and the Hospitalist  Service was called to admit her for further evaluation and management.   HOSPITAL COURSE:  1. Her bloody diarrhea with history of Crohn disease.  She was      initially placed on IV steroids.  GI was consulted to perform a      colonoscopy.  There was no evidence of active Crohn disease,      although she did have some mild colitis at the splenic flexure.      Biopsies of that are still pending.  Her  hemoglobin has been stable      at around 11.6.  She has not required any transfusions.  Dr. Juanda Chance      has recommended that we discontinue the steroids and that she is      ready for discharge.  She will also follow up with Dr. Juanda Chance in      the office for biopsy results.  2. For her hypertension, we have continued on her blood pressure meds.      She has maintained good blood pressure control while in the      hospital.  3. For her GERD, we continued her PPI.  4. Rest of chronic medical problems were not an issue this      hospitalization.   VITAL SIGNS ON THE DAY OF DISCHARGE:  Blood pressure 128/80, heart rate  70, respirations 18, and O2 sats 99% on room air with a temperature of  97.8.   LABORATORIES ON THE DAY OF DISCHARGE:  WBCs 12.5 and hemoglobin 11.6  with a platelet count of 223.  Sodium 135, potassium 3.5, chloride 105,  bicarb 24, BUN 6, creatinine 0.88 with a glucose of 152.  C. diff toxin  x1 is negative.      [name] [name], M.D.  Electronically Signed     EH/MEDQ  D:  08/22/2008  T:  08/23/2008  Job:   [id number]   cc:   Hedwig Morton. Juanda Chance, MD  Gardiner Rhyme, MD

## 2010-11-02 NOTE — Group Therapy Note (Signed)
The patient referred for evaluation of lumbar pain, post lumbar fusion.   CHIEF COMPLAINT:  Back and hip pain, particularly right greater than  left side with some posterior thigh pain as well.  Pain onset was  several years ago.   HISTORY:  A 55 year old female has a history of lumbar degenerative  disk.  She underwent her initial lumbar fusion at L3-4, L4-5 in 2008 per  Dr. Ophelia Charter and had some slippage of cages necessitating repeat fusion at  L3-4, L4-5 levels on July 18, 2007, per Dr. Ophelia Charter.  The patient has  continued smoking even post second fusion.  Her most recent imaging  studies include a CT of lumbar spine showing some facet hypertrophy L2-  3, some potential extraforaminal disk causing nerve root encroachment at  that level on the left side.  Her interbody cages were not incorporating  the superior aspect of L4 and L5, but the posterolateral fusion was  intact and no evidence of nerve root impingement at that time.  Despite  the healing fusion, she has had continued pain.  She has been treated in  the past with OxyContin.  She has been through some physical therapy in  2009.  She had some sacroiliac distribution pain.  I reviewed office  notes from [employer].  She was reduced from OxyContin to  Vicodin, but more recently has been placed back on some oxycodone.   Average pain about 8/10.  She reports pain is worse with walking and  sitting, improves with pacing.  Medications relief from meds is good,  walking tolerance 10-50 minutes.  She uses a wheelchair for shopping.  She has not been employed since March 2008.  She needs assistance with  household duties and shopping, but otherwise independent.  Review of  systems is positive for trouble walking, spasms, weight gain, night  sweats, abdominal pain, poor appetite, and coughing.   PAST SURGICAL HISTORY:  Hysterectomy in 1984, closed reduction in 1987,  some type of bladder surgery in 1994 in addition to the  above.   She has a history of Crohn disease.  She had recent hospitalization for  hematochezia, but was told that it was not related to her Crohn.   SOCIAL HISTORY:  Widowed, smokes half pack per day.  Denies any alcohol  or illegal drug use.   FAMILY HISTORY:  Heart disease, diabetes, high blood pressure,  disability.   PHYSICAL EXAMINATION:  VITAL SIGNS:  Blood pressure 151/57, pulse 80,  respirations 16, O2 sat 95% on room air.  GENERAL:  A well-developed, moderately overweight female in no acute  distress.  Orientation x3.  Affect alert.  EXTREMITIES:  Gait is with a limp using a cane.  She has no evidence of  toe drag or knee instability.  She has good coordination in the upper  and lower extremity.  Deep tendon reflexes are mildly reduced in  bilateral lower extremity, normal in the upper extremity.  Sensation is  intact in the upper and lower extremities.  Extremities are without  edema.  No evidence of intrinsic atrophy.  NECK:  Range of motion is full.  MUSCULOSKELETAL:  Lumbar spine range of motion 50% forward flexion,  extension, lateral rotation, and bending.  Hip, knee, and ankle range of  motion are full.  She does have some tenderness over the right  trochanteric bursa.  She also has pain over the right sacroiliac joint.  Negative FABER maneuver.   IMPRESSION:  1. Lumbar post laminectomy syndrome, chronic  pain.  She does have some      sacroiliac area pain, may have some post fusion sacroiliac      arthropathy.  2. Right hip pain consistent with trochanteric bursitis.  We will      inject today.   In terms of narcotic, analgesics, we will check urine drug screen today  to make sure she is not taking any non prescribed medications or illicit  drugs.  If this checks out okay, I will be able to take over her  oxycodone prescription.  We will also send her for SI injection.   I discussed the plan with the patient.  She is in agreement with this.  Consider  longer-acting medications such as MS Contin or Duragesic patch.      Erick Colace, M.D.  Electronically Signed     AEK/MedQ  D:  12/30/2008 16:43:56  T:  12/31/2008 08:00:33  Job #:  [id number]

## 2010-11-05 NOTE — Assessment & Plan Note (Signed)
[name] [name] is a 55 year old female with chronic back problems.  She has  underwent L3-4 and L4-5 fusion in the past.  Repeat fusion at those same  levels in 2009.  She has had chronic pain in the hip area as well as  sacroiliac area.  She has had sacroiliac injections x1 on February 05, 2009, really did not have any relief from this.  She had some increased  pain the day after the injection.  She was 7/10 on day of injection.  Pain level went up to 9/10 on February 07, 2009, which is 2 days later and  was getting back to baseline at 7 on February 10, 2009, and then on February 11, 2009, she left her porch, took out the trash, and felt like her pain  got worse again.  She is asking for increased pain medicine and  therefore she came in early for another evaluation.  She has had no  increase in numbness or tingling.  She has had no bowel or bladder  dysfunction.  She has had no falls per her report.  Her pain is down to  about 7/10, her sleep is fair.  Pain improves with rest, heat,  medication, and TENS.  Relief from meds is good.  She can walk 5 minutes  at a time, climb steps.  She drives.  She needs some help with shopping  and household duties.   REVIEW OF SYSTEMS:  Positive for numbness, trouble walking, spasms, poor  appetite, and night sweats.   PHYSICAL EXAMINATION:  VITAL SIGNS:  Her blood pressure is 131/84, pulse  69, respiratory rate 16, and O2 saturations 98% room air.  GENERAL:  Well-developed, well-nourished female in no acute stress.  She  moves carefully when she gets up and walks.  She gets some assist from  her boyfriend getting up on exam table using a step stool.  Orientation  x3.  Affect alert.  She can ambulate without a cane, but she feels more  secure with her cane.  NEURO:  Her coordination is normal in the upper and lower extremities.  Deep tendon reflexes are normal in the upper and lower extremities.  EXTREMITIES:  Without edema.  BACK:  No evidence of erythema or  any other skin breakdown.  She has  tenderness in the PSIS area.  She has normal hip range of motion and  strength.  Negative straight leg raise and test.   IMPRESSION:  Lumbar postlaminectomy syndrome and chronic postoperative  pain.  She may have some L5-S1 facet arthropathy as well and other  potential pain generators would be adjacent level as this degeneration  certainly appears to be at her baseline in terms of pain numbers, but  given her increased difficulty with movement, we will check PA pelvis.  Prior lumbar myelogram CT is reviewed.  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  This may also be causing some of her symptomatology.   I will see her back another month.  We will increase her MS Contin to  t.i.d.  Consider L2-L3 epidurals.      Erick Colace, M.D.  Electronically Signed     AEK/MedQ  D:  03/06/2009 15:39:52  T:  03/07/2009 05:48:34  Job #:  [id number]

## 2010-12-03 ENCOUNTER — Ambulatory Visit: Payer: Medicare Other | Admitting: Physical Medicine & Rehabilitation

## 2010-12-03 ENCOUNTER — Encounter: Payer: Medicare Other | Attending: Physical Medicine & Rehabilitation

## 2010-12-03 DIAGNOSIS — Z981 Arthrodesis status: Secondary | ICD-10-CM | POA: Insufficient documentation

## 2010-12-03 DIAGNOSIS — IMO0002 Reserved for concepts with insufficient information to code with codable children: Secondary | ICD-10-CM

## 2010-12-03 DIAGNOSIS — M961 Postlaminectomy syndrome, not elsewhere classified: Secondary | ICD-10-CM

## 2010-12-03 DIAGNOSIS — M533 Sacrococcygeal disorders, not elsewhere classified: Secondary | ICD-10-CM | POA: Insufficient documentation

## 2010-12-06 NOTE — Assessment & Plan Note (Signed)
REASON FOR VISIT:  Back pain.  HISTORY:  A 55 year old female with lumbar post laminectomy syndrome. We did sacroiliac RF on her.  She states that it helped for about 3 days only.  She remains on morphine sulfate IR 15 mg q.noon and morphine sulfate CR 15 mg t.i.d.  INTERVAL MEDICAL HISTORY:  Positive for colonoscopy.  She has had continued abdominal pain post colonoscopy, went to the ED at [facility] where her position is, and has a followup appointment.  She has had some diarrhea associated with some nausea and poor appetite.  Walking tolerance is 10 minutes.  She is able to climb steps.  She is able to drive.  She needs help with meal prep, household duties, and shopping, but is otherwise independent.  PHYSICAL EXAMINATION:  VITAL SIGNS:  Blood pressure 143/88, pulse 62, respirations 18, O2 sat 98% on room air. GENERAL:  Mood and affect are flat. MUSCULOSKELETAL:  Her back has no tenderness to palpation.  Straight leg raising test negative.  Lower extremity strength is normal.  She walks slowly and states that this is due to combination of her stomach bothering her as well as her back bothering her.  IMPRESSION: 1. Lumbar post laminectomy syndrome with history of radiculitis.  She     did not get a prolonged relief from her sacroiliac RF.  Three day     duration.  She did receive gabapentin 1800 mg that first evening.     We will trial her on gabapentin 300 mg bedtime times a week and     then building up to b.i.d. t.i.d. then q.i.d. over the course of     week time each.  We will continue on the current morphine sulfate 2. If any exacerbation of her abdominal pain to call her     gastroenterologist at [facility].  Discussed with patient agrees with     plan.     Erick Colace, M.D. Electronically Signed    AEK/MedQ D:  12/03/2010 10:53:07  T:  12/03/2010 23:58:37  Job #:  [id number]

## 2010-12-30 ENCOUNTER — Encounter: Payer: Medicare Other | Attending: Physical Medicine & Rehabilitation | Admitting: Neurosurgery

## 2010-12-30 DIAGNOSIS — M961 Postlaminectomy syndrome, not elsewhere classified: Secondary | ICD-10-CM

## 2010-12-30 DIAGNOSIS — M533 Sacrococcygeal disorders, not elsewhere classified: Secondary | ICD-10-CM | POA: Insufficient documentation

## 2010-12-30 DIAGNOSIS — M5430 Sciatica, unspecified side: Secondary | ICD-10-CM

## 2010-12-30 DIAGNOSIS — Z981 Arthrodesis status: Secondary | ICD-10-CM | POA: Insufficient documentation

## 2010-12-30 NOTE — Assessment & Plan Note (Signed)
HISTORY OF PRESENT ILLNESS:  [name] [name] is a patient Dr. Wynn Banker seen for extreme low back and right hip pain.  She has undergone injections in the past.  She had a radiofrequency ablation done in April and states the pain did not subside for more than just a few days.  Today, she rates her pain at a 8.  General activity level is 9-10.  The pain is same 24 hours a day.  Sleep patterns are fair.  All activities aggravate.  Medication tends to help.  MOBILITY:  She walks with a cane.  She can climb steps and drive.  She can only walk about 10 minutes.  She will use a wheelchair when shopping.  She is on disability.  REVIEW OF SYSTEMS:  Notable for difficulties described above as well as trouble with ambulation and spasm.  No signs of depression, suicidal thoughts, or aberrant behavior.  She has problems of nausea and vomiting and some GI upset.  PAST MEDICAL HISTORY:  Unchanged.  SOCIAL HISTORY:  Unchanged.  FAMILY HISTORY:  Unchanged.  PHYSICAL EXAMINATION:  VITAL SIGNS:  Blood pressure 134/63, pulse 67, respirations 16, and O2 sats 98 on room air. NEUROLOGIC:  Motor strength somewhat diminished, especially in the right lower extremity.  It is about 4/5 with quadriceps.  Sensation is little diminished on this side also.  Constitutionally, she is mildly obese. She is alert and oriented x3.  She walks with a cane and has a limp.  IMPRESSION: 1. Sacroiliac disorder, low back pain. 2. Post-laminectomy syndrome.  PLAN:  Refill her morphine sulfate IR 15 mg 1 p.o. daily 30 with no refill, MS Contin CR 15 mg 1 p.o. t.i.d. 90 with no refill.  She will follow up here in the clinic in 1 month.  Her questions were encouraged and answered.     Nazia Rhines L. Blima Dessert Electronically Signed    RLW/MedQ D:  12/30/2010 10:27:06  T:  12/30/2010 19:34:26  Job #:  [id number]

## 2011-01-28 ENCOUNTER — Encounter: Payer: Medicare Other | Attending: Physical Medicine & Rehabilitation | Admitting: Neurosurgery

## 2011-01-28 DIAGNOSIS — M961 Postlaminectomy syndrome, not elsewhere classified: Secondary | ICD-10-CM

## 2011-01-28 DIAGNOSIS — Z981 Arthrodesis status: Secondary | ICD-10-CM | POA: Insufficient documentation

## 2011-01-28 DIAGNOSIS — M533 Sacrococcygeal disorders, not elsewhere classified: Secondary | ICD-10-CM | POA: Insufficient documentation

## 2011-01-28 DIAGNOSIS — M545 Low back pain: Secondary | ICD-10-CM

## 2011-01-28 NOTE — Assessment & Plan Note (Signed)
Account [id number].  This is a patient of Dr. Wynn Banker seen for low back pain and right- sided hip pain.  She states she has not had any change in her condition. She has had some GI problems.  She got a history of Crohn.  She has been investigating since the 80s to try to find out where her right-sided abdominal pain is coming from.  She has got an appointment at [facility] within the next 2 weeks to have possibly a diagnostic scope done after she did some investigation on her own.  They think she may have a necrosed small bowel and they are going to look for that.  Otherwise her pain is unchanged.  She rates her pain at 8 or 9, it is a sharp, constant pain.  Her average pain is 8 or 9.  General activity level is 9 to 10.  The pain is same 24 hours a day.  Sleep patterns fair.  All activities aggravate.  Heat tends to help.  Medications and TENS unit tends to help.  She uses cane for ambulation.  She can climb steps and drive.  She can walk about 10 minutes at a time.  She uses a wheelchair usually for shopping.  She is on disability.  REVIEW OF SYSTEMS:  Notable for the difficulties described above as well as some weight gain, night sweats, paresthesias, various GI problems, no aberrant behaviors, or suicidal thoughts.  Her Oswestry score is 70.  PAST MEDICAL HISTORY:  Unchanged.  SOCIAL HISTORY:  Unchanged.  FAMILY HISTORY:  Unchanged.  PHYSICAL EXAMINATION:  VITAL SIGNS:  Her blood pressure is 113/70, pulse 69, respirations 18, O2 sats 97 on room air.  Motor strength is 5/5 in the iliopsoas, quadriceps resistance testing. Her sensation is intact.  Constitutionally, she is within normal limits. She is alert and oriented x3.  She has a slightly altered gait.  She does have a low back brace in place.  IMPRESSION: 1. Sacroiliac disorder, chronic low back pain. 2. Post laminectomy syndrome.  PLAN: 1. Refill morphine sulfate 15 mg one p.o. daily 30 with no refills. 2. MS Contin CR 15 mg  one p.o. t.i.d. 90 with no refill.  She was     warned about low pill counts she is about 3 days short on her MS     Contin.  She stated understanding.  Her questions were encouraged     and answered.  We will see her back here in clinic in 1 month.     Mackenzi Krogh L. Blima Dessert Electronically Signed    RLW/MedQ D:  01/28/2011 12:32:21  T:  01/28/2011 19:03:14  Job #:  [id number]

## 2011-02-25 ENCOUNTER — Encounter: Payer: Medicare Other | Attending: Neurosurgery | Admitting: Neurosurgery

## 2011-02-25 DIAGNOSIS — M961 Postlaminectomy syndrome, not elsewhere classified: Secondary | ICD-10-CM

## 2011-02-25 DIAGNOSIS — M545 Low back pain, unspecified: Secondary | ICD-10-CM

## 2011-02-25 DIAGNOSIS — G8929 Other chronic pain: Secondary | ICD-10-CM | POA: Insufficient documentation

## 2011-02-25 DIAGNOSIS — M25559 Pain in unspecified hip: Secondary | ICD-10-CM | POA: Insufficient documentation

## 2011-02-25 DIAGNOSIS — G894 Chronic pain syndrome: Secondary | ICD-10-CM

## 2011-02-25 NOTE — Assessment & Plan Note (Signed)
Account [id number].  This is a patient of Dr. Wynn Banker seen for low back pain and right- sided hip pain.  She states she followed up with Dr. Ophelia Charter who has done surgery on her back and neck yesterday and he is basically telling her to give it more time due to the fact that she is still having the right radicular pain but she states it is getting worse.  The patient tells me she had spoke to Dr. Wynn Banker in the past about a possible spinal cord stimulator implant, and she is going to think about that may be proceed and request a referral for the trial to be done.  She rates her pain 9. It is a sharp, stabbing, aching pain that is constant.  General activity level is 10.  Pain is same 24 hours a day.  Sleep patterns are poor. All activities aggravate.  Rest, heat, medication, and TENS unit tends to help.  She uses a cane for ambulation.  She can climb steps and drive.  She can only walk about 10 minutes at a time.  She uses a wheelchair for shopping.  She is on disability.  She needs help with household duties and shopping.  REVIEW OF SYSTEMS:  Notable for the difficulties described above as well as some weight gain, night sweats, trouble with ambulation, spasm, weakness, no suicidal thoughts, or aberrant behaviors.  PAST MEDICAL HISTORY:  Unchanged.  SOCIAL HISTORY:  Unchanged.  PHYSICAL EXAMINATION:  Her Oswestry score is 68.  Vital signs, blood pressure is 134/83, pulse 70, respirations 18, O2 sats 98 on room air. Constitutionally, she is within normal limits.  She is alert and oriented x3.  Her gait is somewhat altered.  She does have a back brace on today.  Motor strength is diminished in lower extremities.  She gives way to pain.  Sensation is intact.  IMPRESSION: 1. Sacroiliac disorder, chronic low back pain. 2. Post laminectomy syndrome with radicular symptoms.  PLAN: 1. Refill morphine sulfate 15 mg IR one p.o. q.8, #30 with no refill.     MS Contin CR 15 mg one p.o. t.i.d.  90 with no refill. 2. She will follow up here in 1 month.  Her questions were encouraged     and answered. 3. She will call us and let us know if she decides to proceed with a     spinal cord stimulator for referral.     Shaine Mount L. Blima Dessert Electronically Signed    RLW/MedQ D:  02/25/2011 13:16:11  T:  02/25/2011 22:30:15  Job #:  [id number]

## 2011-03-10 LAB — URINALYSIS, ROUTINE W REFLEX MICROSCOPIC
Bilirubin Urine: NEGATIVE
Hgb urine dipstick: NEGATIVE
Ketones, ur: NEGATIVE
Nitrite: NEGATIVE
Protein, ur: NEGATIVE
Specific Gravity, Urine: 1.034 — ABNORMAL HIGH
Urobilinogen, UA: 0.2

## 2011-03-10 LAB — BASIC METABOLIC PANEL
BUN: 18
Chloride: 102
GFR calc Af Amer: >60
GFR calc non Af Amer: 59 — ABNORMAL LOW
Potassium: 4.6
Sodium: 136

## 2011-03-10 LAB — CBC
HCT: 24.6 — ABNORMAL LOW
HCT: 38.9
Hemoglobin: 12.6
MCHC: 32.4
MCV: 88.5
Platelets: 192
Platelets: 266
RBC: 2.78 — ABNORMAL LOW
RDW: 13.5
WBC: 9.9

## 2011-03-10 LAB — POCT I-STAT 4, (NA,K, GLUC, HGB,HCT)
HCT: 32 — ABNORMAL LOW
Hemoglobin: 10.9 — ABNORMAL LOW
Sodium: 136

## 2011-03-10 LAB — COMPREHENSIVE METABOLIC PANEL
Albumin: 4.4
Alkaline Phosphatase: 42
BUN: 14
Calcium: 9.8
Creatinine, Ser: 0.92
Glucose, Bld: 104 — ABNORMAL HIGH
Total Protein: 7.6

## 2011-03-10 LAB — HEMOGLOBIN AND HEMATOCRIT, BLOOD
HCT: 30.7 — ABNORMAL LOW
Hemoglobin: 10.3 — ABNORMAL LOW

## 2011-03-10 LAB — CROSSMATCH

## 2011-03-10 LAB — DIFFERENTIAL
Eosinophils Absolute: 0.1
Lymphocytes Relative: 42
Lymphs Abs: 2.4
Monocytes Relative: 7
Neutrophils Relative %: 48

## 2011-03-10 LAB — TYPE AND SCREEN
ABO/RH(D): A POS
Antibody Screen: NEGATIVE

## 2011-03-10 LAB — PROTIME-INR
INR: 1
Prothrombin Time: 13

## 2011-03-10 LAB — APTT: aPTT: 30

## 2011-03-24 ENCOUNTER — Encounter: Payer: Medicare Other | Attending: Neurosurgery | Admitting: Neurosurgery

## 2011-03-24 DIAGNOSIS — M961 Postlaminectomy syndrome, not elsewhere classified: Secondary | ICD-10-CM

## 2011-03-24 DIAGNOSIS — K5090 Crohn's disease, unspecified, without complications: Secondary | ICD-10-CM | POA: Insufficient documentation

## 2011-03-24 DIAGNOSIS — M545 Low back pain, unspecified: Secondary | ICD-10-CM | POA: Insufficient documentation

## 2011-03-24 DIAGNOSIS — M25559 Pain in unspecified hip: Secondary | ICD-10-CM | POA: Insufficient documentation

## 2011-03-24 DIAGNOSIS — E669 Obesity, unspecified: Secondary | ICD-10-CM | POA: Insufficient documentation

## 2011-03-24 DIAGNOSIS — R109 Unspecified abdominal pain: Secondary | ICD-10-CM | POA: Insufficient documentation

## 2011-03-24 DIAGNOSIS — G8929 Other chronic pain: Secondary | ICD-10-CM | POA: Insufficient documentation

## 2011-03-24 DIAGNOSIS — M533 Sacrococcygeal disorders, not elsewhere classified: Secondary | ICD-10-CM | POA: Insufficient documentation

## 2011-03-24 DIAGNOSIS — M5137 Other intervertebral disc degeneration, lumbosacral region: Secondary | ICD-10-CM

## 2011-03-24 NOTE — Assessment & Plan Note (Signed)
ACCOUNT:  [id number].  This is the patient of Dr. Wynn [name] seen for low back pain and right hip pain.  She states she has got pending surgery for Crohn's and bowel aggravation scheduled at [facility] sometime in November.  She is waiting here for the exact date.  She rates her pain here unchanged at 9.  It is a sharp to stabbing, constant, aching pain.  Her general activity level is 10.  Pain is same 24 hours a day.  Pain is worse with walking, sitting, activity, and standing.  Rest, heat, medication TENS unit helps.  She can climb steps and drive.  She walks about 10 minutes at a time. Sometime, she will use a wheelchair when shopping.  She is on disability, needs help with meal prep, household duties, and shopping.  REVIEW OF SYSTEMS:  Notable for those difficulties describe above as well as some trouble with ambulation, spasm.  No suicidal thoughts or aberrant behaviors/pill count and UDS were consistent.  PAST MEDICAL HISTORY, SOCIAL HISTORY AND FAMILY HISTORY:  Unchanged.  PHYSICAL EXAMINATION:  Blood pressure 117/72, pulse 54, respirations 16, O2 sats 98 on room air.  Motor strength, sensation intact in upper and lower extremity.  Constitutionally, she is obese.  She is alert and oriented x3.  Her mood is good.  She does have a cane for ambulation.  IMPRESSION: 1. Sacroiliac disorder, chronic low back pain. 2. Post laminectomy syndrome. 3. Abdominal pain and difficulties being treated at [facility].  PLAN: 1. Refill MS Contin CR 15 mg 1 p.o. t.i.d., 90 with no refill. 2. Morphine sulfate IR 15 mg 1 p.o. daily, 30 with no refill.  Her questions were encouraged and answered.  I will see her back in a month.     [name] [name] Electronically Signed    RLW/MedQ D:  03/24/2011 10:03:12  T:  03/24/2011 12:19:45  Job #:  [id number]

## 2011-04-21 ENCOUNTER — Encounter: Payer: Medicare Other | Attending: Neurosurgery | Admitting: Neurosurgery

## 2011-04-21 DIAGNOSIS — M961 Postlaminectomy syndrome, not elsewhere classified: Secondary | ICD-10-CM | POA: Insufficient documentation

## 2011-04-21 DIAGNOSIS — M545 Low back pain, unspecified: Secondary | ICD-10-CM | POA: Insufficient documentation

## 2011-04-21 DIAGNOSIS — G8929 Other chronic pain: Secondary | ICD-10-CM | POA: Insufficient documentation

## 2011-04-21 DIAGNOSIS — G894 Chronic pain syndrome: Secondary | ICD-10-CM

## 2011-04-21 DIAGNOSIS — R109 Unspecified abdominal pain: Secondary | ICD-10-CM | POA: Insufficient documentation

## 2011-04-21 DIAGNOSIS — M533 Sacrococcygeal disorders, not elsewhere classified: Secondary | ICD-10-CM | POA: Insufficient documentation

## 2011-04-21 DIAGNOSIS — M5430 Sciatica, unspecified side: Secondary | ICD-10-CM

## 2011-04-22 NOTE — Assessment & Plan Note (Signed)
Account [id number].  This is a patient Dr. Wynn Banker who is seen for low back pain and right hip pain.  She reports no change in her pain.  Rates it a 9.  It is a sharp, constant aching stabbing pain.  General activity level is 9-10. Pain is same 24 hours a day.  Sleep pattern is fair.  Pain is worse with all activities and standing, rest, heat, and medication tend to help. She can walk about 10 minutes at a time.  She climbs steps and drives. She needs wheelchair for shopping and functionally she is on disability. She needs help with meal preparation, household duties, and shopping.  REVIEW OF SYSTEMS:  Notable for those difficulties described above as well as some weight gain, trouble walking spasms, no suicidal thoughts, or aberrant behaviors.  Last pill count and UDS consistent.  PAST MEDICAL HISTORY/SOCIAL HISTORY/FAMILY HISTORY:  Unchanged.  PHYSICAL EXAMINATION:  VITAL SIGNS:  Blood pressure is 118/77, pulse 66, respirations 18, O2 sats 100 on room air.  Her motor strength and sensation are intact.  Constitutionally, she is within normal limits.  She is alert and oriented x3.  Her gait is somewhat altered with a cane.  The patient states she does have abdominal surgery pending at [facility] for November 27 for this month.  She would like to get that over done.  IMPRESSION: 1. Sacroiliac disorder, chronic low back pain. 2. Post laminectomy syndrome. 3. Abdominal pain, pending surgery at [facility].  PLAN: 1. Refill MS Contin CR 15 mg one p.o. t.i.d. 90 with no refill. 2. Morphine sulfate IR 15 mg one p.o. daily, 30, with no refill.  Her questions were encouraged and answered.  I will see her back in 1 month.     Joakim Huesman L. Blima Dessert Electronically Signed    RLW/MedQ D:  04/21/2011 11:36:01  T:  04/22/2011 00:36:26  Job #:  [id number]

## 2011-05-11 HISTORY — PX: COLON SURGERY: SHX602

## 2011-05-16 ENCOUNTER — Encounter: Payer: Medicare Other | Attending: Neurosurgery | Admitting: Neurosurgery

## 2011-05-16 DIAGNOSIS — M961 Postlaminectomy syndrome, not elsewhere classified: Secondary | ICD-10-CM | POA: Insufficient documentation

## 2011-05-16 DIAGNOSIS — M545 Low back pain, unspecified: Secondary | ICD-10-CM | POA: Insufficient documentation

## 2011-05-16 DIAGNOSIS — M25559 Pain in unspecified hip: Secondary | ICD-10-CM | POA: Insufficient documentation

## 2011-05-16 DIAGNOSIS — G894 Chronic pain syndrome: Secondary | ICD-10-CM

## 2011-05-16 DIAGNOSIS — M533 Sacrococcygeal disorders, not elsewhere classified: Secondary | ICD-10-CM | POA: Insufficient documentation

## 2011-05-16 DIAGNOSIS — R109 Unspecified abdominal pain: Secondary | ICD-10-CM | POA: Insufficient documentation

## 2011-05-16 DIAGNOSIS — G8929 Other chronic pain: Secondary | ICD-10-CM | POA: Insufficient documentation

## 2011-05-16 NOTE — Assessment & Plan Note (Signed)
This is a patient of Dr. Wynn Banker, seen for low back and right hip pain.  She is having surgery tomorrow at [facility] for hernia repairs.  She rates her average pain at a 9.  It is a sharp, stabbing, constant pain. General activity level 10.  Pain is same 24 hours a day.  All activities aggravate, rest, heat, medication, TENS unit helps.  She walks with a cane.  She climb steps and drives.  She can walk about 10 minutes at a time.  Needs some help with shopping. She is on disability.  Needs help with meal preparation, household duties.  REVIEW OF SYSTEMS:  Notable for difficulties as described above as well as some trouble walking and spasms.  No suicidal thoughts or aberrant behaviors.  Last pill count on UDS consistent.  Past medical history, social history, and family history unchanged.  PHYSICAL EXAMINATION:  VITAL SIGNS:  Blood pressure 146/79, pulse 71, respirations 18, O2 sats 96 on room air. MUSCULOSKELETAL:  Her motor strength and sensation are intact. NEUROLOGIC:  Constitutionally, she is mildly obese.  She is alert and oriented x3.  She has fairly normal gait.  She walks with a single-point cane.  IMPRESSION: 1. History of sacroiliac disorder, chronic low back pain. 2. Postlaminectomy syndrome. 3. Abdominal pain.  Surgery pending for tomorrow at [facility].  PLAN: 1. Refill MS Contin CR 15 mg 1 p.o. t.i.d., 90 with no refill. 2. Morphine sulfate IR 15 mg 1 p.o. daily, 30 with no refill. 3. She will call us after she is discharged from [facility] to let us know     what medicines they have her on, otherwise we will see her back in     a month.  Her questions were encouraged and answered.     Carmello Cabiness L. Blima Dessert Electronically Signed    RLW/MedQ D:  05/16/2011 14:22:41  T:  05/16/2011 23:11:51  Job #:  [id number]

## 2011-06-15 ENCOUNTER — Encounter: Payer: Medicare Other | Attending: Neurosurgery | Admitting: Neurosurgery

## 2011-06-15 DIAGNOSIS — M545 Low back pain, unspecified: Secondary | ICD-10-CM | POA: Insufficient documentation

## 2011-06-15 DIAGNOSIS — M5430 Sciatica, unspecified side: Secondary | ICD-10-CM

## 2011-06-15 DIAGNOSIS — M533 Sacrococcygeal disorders, not elsewhere classified: Secondary | ICD-10-CM | POA: Insufficient documentation

## 2011-06-15 DIAGNOSIS — M961 Postlaminectomy syndrome, not elsewhere classified: Secondary | ICD-10-CM | POA: Insufficient documentation

## 2011-06-16 NOTE — Assessment & Plan Note (Signed)
This is patient of Dr. Wynn Banker, seen for history of low back pain post laminectomy syndrome.  She has had recent abdominal surgery at [facility] and doing well with that.  Her average pain is 9.  It is a sharp, stabbing, aching pain.  General activity level is 9-10.  Pain is same 24 hours a day.  Sleep patterns are fair.  All activities aggravate, heat and medication tend to help.  She walks with a cane at times.  She does climb steps, drives, she can walk about 10 minutes at a time.  She uses a wheelchair when shopping.  Functionally, she needs help with ADLs.  REVIEW OF SYSTEMS:  Notable for difficulties as described above as well as some spasms, trouble walking, abdominal pain, poor appetite, and weight gain.  No aberrant behaviors.  Past UDS and pill count correct.  Past medical history, social history, and family history unchanged.  PHYSICAL EXAMINATION:  VITAL SIGNS:  Blood pressure is 119/73, pulse 63, respirations 16, O2 sats 97 on room air. MUSCULOSKELETAL:  Motor strength sensation intact. CONSTITUTIONAL:  Within normal limits.  She is alert and oriented x3. She has a slight limp.  IMPRESSION: 1. Recent abdominal surgery at [facility], doing well. 2. History of sacroiliac disorder low-back pain. 3. Post laminectomy syndrome.  PLAN: 1. Refill MS Contin CR 15 mg 1 p.o. t.i.d. 90 with no refill. 2. Morphine sulfate 15 mg IR one p.o. daily 30 with no refill. 3. Voltaren gel apply to affected area q.i.d. 3 tubes with 3 refills.  Her questions were encouraged and answered.  I will see her in a month.     Kelbi Renstrom L. Blima Dessert Electronically Signed    RLW/MedQ D:  06/15/2011 11:22:16  T:  06/16/2011 03:44:55  Job #:  [id number]

## 2011-07-14 ENCOUNTER — Encounter: Payer: Medicare Other | Attending: Neurosurgery | Admitting: Neurosurgery

## 2011-07-14 DIAGNOSIS — M961 Postlaminectomy syndrome, not elsewhere classified: Secondary | ICD-10-CM

## 2011-07-14 DIAGNOSIS — G8928 Other chronic postprocedural pain: Secondary | ICD-10-CM

## 2011-07-14 DIAGNOSIS — M545 Low back pain, unspecified: Secondary | ICD-10-CM | POA: Insufficient documentation

## 2011-07-14 DIAGNOSIS — M25559 Pain in unspecified hip: Secondary | ICD-10-CM | POA: Insufficient documentation

## 2011-07-14 DIAGNOSIS — R109 Unspecified abdominal pain: Secondary | ICD-10-CM | POA: Insufficient documentation

## 2011-07-14 NOTE — Assessment & Plan Note (Signed)
This is a patient of Dr. Wynn Banker' seen for abdominal pain, right-sided low back pain, and hip pain.  She reports pain at a 9.  It is a sharp, constant pain.  General activity level is 9-10.  Pain is same 24 hours a day.  Sleep patterns are fair.  All activities aggravate.  Rest, heat, medication tend to help.  She uses a cane for ambulation.  She does climb steps.  She drives.  She can walk about 10 minutes at a time.  She functionally is on disability, needs some help with household duties.  REVIEW OF SYSTEMS:  Notable for difficulties described above as well as some trouble walking and spasms.  No signs of aberrant behavior.  Last pill count and UDS consistent.  PAST MEDICAL HISTORY:  Unchanged.  SOCIAL HISTORY:  Unchanged.  FAMILY HISTORY:  Unchanged.  PHYSICAL EXAMINATION:  VITAL SIGNS:  Her blood pressure is 126/80, pulse 68, respirations 16, O2 sats 98 on room air. NEUROLOGIC:  Motor strength and sensation are intact. CONSTITUTIONAL:  She is within normal limits.  She is alert and oriented x3.  IMPRESSION: 1. Recent abdominal surgery at [facility], healing well. 2. History of sacroiliac disorder, low back pain. 3. Post-laminectomy syndrome.  PLAN: 1. Refill MS Contin 15 mg 1 p.o. t.i.d., 90 with no refill. 2. Morphine sulfate IR 15 mg 1 a day, 30 with no refills.  Questions     were encouraged and answered, and we will see her in a month.     [name] [name] L. Blima Dessert Electronically Signed    RLW/MedQ D:  07/14/2011 10:18:07  T:  07/14/2011 20:22:10  Job #:  [id number]

## 2011-08-10 ENCOUNTER — Ambulatory Visit: Payer: Medicare Other

## 2011-08-11 ENCOUNTER — Encounter: Payer: Self-pay | Admitting: Physical Medicine & Rehabilitation

## 2011-08-11 ENCOUNTER — Encounter: Payer: Medicare Other | Attending: Neurosurgery

## 2011-08-11 ENCOUNTER — Ambulatory Visit (HOSPITAL_BASED_OUTPATIENT_CLINIC_OR_DEPARTMENT_OTHER): Payer: Medicare Other | Admitting: Physical Medicine & Rehabilitation

## 2011-08-11 DIAGNOSIS — M545 Low back pain, unspecified: Secondary | ICD-10-CM | POA: Insufficient documentation

## 2011-08-11 DIAGNOSIS — M533 Sacrococcygeal disorders, not elsewhere classified: Secondary | ICD-10-CM

## 2011-08-11 DIAGNOSIS — M961 Postlaminectomy syndrome, not elsewhere classified: Secondary | ICD-10-CM

## 2011-08-11 DIAGNOSIS — M5430 Sciatica, unspecified side: Secondary | ICD-10-CM

## 2011-08-11 DIAGNOSIS — K5090 Crohn's disease, unspecified, without complications: Secondary | ICD-10-CM | POA: Insufficient documentation

## 2011-08-11 DIAGNOSIS — IMO0002 Reserved for concepts with insufficient information to code with codable children: Secondary | ICD-10-CM | POA: Insufficient documentation

## 2011-08-11 MED ORDER — MORPHINE SULFATE 15 MG PO TABS: 15.0000 mg | ORAL_TABLET | Freq: Every day | ORAL | Status: DC

## 2011-08-11 MED ORDER — MORPHINE SULFATE CR 15 MG PO TB12: 15.0000 mg | ORAL_TABLET | Freq: Three times a day (TID) | ORAL | Status: DC

## 2011-08-11 NOTE — Progress Notes (Signed)
  Subjective:    Patient ID: [name] [name], female    DOB: Jan 27, 1956, 56 y.o.   MRN: [id number]  Back Pain This is a chronic problem. The current episode started more than 1 year ago. The problem occurs constantly. The problem has been gradually worsening since onset. The pain is present in the lumbar spine. The quality of the pain is described as stabbing. The pain radiates to the left thigh and right thigh. The pain is at a severity of 9/10. The pain is moderate. The pain is the same all the time. The symptoms are aggravated by sitting and standing (doing household duties). Stiffness is present in the morning. Associated symptoms include leg pain, tingling and weakness. She has tried heat (MS Contin) for the symptoms. The treatment provided moderate relief.      Review of Systems  HENT: Negative.   Eyes: Negative.   Respiratory: Negative.   Cardiovascular: Negative.   Gastrointestinal: Negative.   Genitourinary: Negative.   Musculoskeletal: Positive for back pain.  Neurological: Positive for tingling and weakness.  Hematological: Negative.   Psychiatric/Behavioral: Negative.        Objective:   Physical Exam  Constitutional: She appears well-developed.  Cardiovascular: Normal rate, regular rhythm and normal heart sounds.   Pulmonary/Chest: Effort normal and breath sounds normal.  Abdominal: Soft. Bowel sounds are normal.  Musculoskeletal:       Right shoulder: Normal.       Left shoulder: Normal.       Right elbow: Normal.      Left elbow: Normal.       Right wrist: Normal.       Left wrist: Normal.       Right hip: She exhibits decreased range of motion.       Left hip: Normal.       Right knee: Normal.       Left knee: Normal.       Right ankle: Normal.       Left ankle: Normal. Achilles tendon normal.       Lumbar back: She exhibits decreased range of motion, tenderness and pain. She exhibits no spasm.  Neurological: She has normal strength. She displays abnormal  reflex. No sensory deficit.  Reflex Scores:      Patellar reflexes are 0 on the right side and 2+ on the left side.      Achilles reflexes are 1+ on the right side and 1+ on the left side.         Assessment & Plan:  1.  Lumbar post lami syndrome 2.  Sacroiliac disorder 3. Lumbosacral neuritis 4. Sciatica 5. Chronic low back pain

## 2011-08-11 NOTE — Patient Instructions (Signed)
Sacroiliac Joint Dysfunction The sacroiliac joint connects the lower part of the spine (the sacrum) with the bones of the pelvis. CAUSES  Sometimes, there is no obvious reason for sacroiliac joint dysfunction. Other times, it may occur   During pregnancy.   After injury, such as:   Car accidents.   Sport-related injuries.   Work-related injuries.   Due to one leg being shorter than the other.   Due to other conditions that affect the joints, such as:   Rheumatoid arthritis.   Gout.   Psoriasis.   Joint infection (septic arthritis).  SYMPTOMS  Symptoms may include:  Pain in the:   Lower back.   Buttocks.   Groin.   Thighs and legs.   Difficult sitting, standing, walking, lying, bending or lifting.  DIAGNOSIS  A number of tests may be used to help diagnose the cause of sacroiliac joint dysfunction, including:  Imaging tests to look for other causes of pain, including:   MRI.   CT scan.   Bone scan.   Diagnostic injection: During a special x-ray (called fluoroscopy), a needle is put into the sacroiliac joint. A numbing medicine is injected into the joint. If the pain is improved or stopped, the diagnosis of sacroiliac joint dysfunction is more likely.  TREATMENT  There are a number of types of treatment used for sacroiliac joint dysfunction, including:  Only take over-the-counter or prescription medicines for pain, discomfort, or fever as directed by your caregiver.   Medications to relax muscles.   Rest. Decreasing activity can help cut down on painful muscle spasms and allow the back to heal.   Application of heat or ice to the lower back may improve muscle spasms and soothe pain.   Brace. A special back brace, called a sacroiliac belt, can help support the joint while your back is healing.   Physical therapy can help teach comfortable positions and exercises to strengthen muscles that support the sacroiliac joint.   Cortisone injections. Injections  of steroid medicine into the joint can help decrease swelling and improve pain.   Hyaluronic acid injections. This chemical improves lubrication within the sacroiliac joint, thereby decreasing pain.   Radiofrequency ablation. A special needle is placed into the joint, where it burns away nerves that are carrying pain messages from the joint.   Surgery. Because pain occurs during movement of the joint, screws and plates may be installed in order to limit or prevent joint motion.  HOME CARE INSTRUCTIONS   Take all medications exactly as directed.   Follow instructions regarding both rest and physical activity, to avoid worsening the pain.   Do physical therapy exercises exactly as prescribed.  SEEK IMMEDIATE MEDICAL CARE IF:  You experience increasingly severe pain.   You develop new symptoms, such as numbness or tingling in your legs or feet.   You lose bladder or bowel control.  Document Released: 09/02/2008 Document Revised: 02/16/2011 Document Reviewed: 09/02/2008 Exit[employer] Patient Information 2012 [employer]. 

## 2011-09-26 ENCOUNTER — Encounter: Payer: Medicare Other | Attending: Neurosurgery

## 2011-09-26 ENCOUNTER — Encounter: Payer: Self-pay | Admitting: Physical Medicine & Rehabilitation

## 2011-09-26 ENCOUNTER — Ambulatory Visit (HOSPITAL_BASED_OUTPATIENT_CLINIC_OR_DEPARTMENT_OTHER): Payer: Medicare Other | Admitting: Physical Medicine & Rehabilitation

## 2011-09-26 VITALS — BP 155/72 | HR 58 | Resp 16 | Ht 66.0 in | Wt 202.0 lb

## 2011-09-26 DIAGNOSIS — M533 Sacrococcygeal disorders, not elsewhere classified: Secondary | ICD-10-CM

## 2011-09-26 DIAGNOSIS — M961 Postlaminectomy syndrome, not elsewhere classified: Secondary | ICD-10-CM

## 2011-09-26 DIAGNOSIS — M545 Low back pain, unspecified: Secondary | ICD-10-CM | POA: Insufficient documentation

## 2011-09-26 DIAGNOSIS — IMO0002 Reserved for concepts with insufficient information to code with codable children: Secondary | ICD-10-CM

## 2011-09-26 DIAGNOSIS — M5430 Sciatica, unspecified side: Secondary | ICD-10-CM

## 2011-09-26 MED ORDER — MORPHINE SULFATE 15 MG PO TABS: 15.0000 mg | ORAL_TABLET | Freq: Every day | ORAL | Status: DC

## 2011-09-26 MED ORDER — MORPHINE SULFATE CR 15 MG PO TB12: 15.0000 mg | ORAL_TABLET | Freq: Three times a day (TID) | ORAL | Status: DC

## 2011-09-26 NOTE — Patient Instructions (Signed)
Do not wear back brace around the house.

## 2011-09-26 NOTE — Progress Notes (Signed)
  Subjective:    Patient ID: [name] [name], female    DOB: [birth year], 56 y.o.   MRN: [id number]  HPI Increased back pain before Easter treated with alternating heat and ice. Marble-sized knots left-sided back as well as neck area which seemed to be subsiding. No falls or other trauma reported. Pain Inventory Average Pain 10 Pain Right Now 10 My pain is constant, sharp, burning, stabbing and aching  In the last 24 hours, has pain interfered with the following? General activity 10 Relation with others 10 Enjoyment of life 10 What TIME of day is your pain at its worst? at all times Sleep (in general) Poor  Pain is worse with: walking, bending, sitting, inactivity and standing Pain improves with: medication Relief from Meds: 1  Mobility use a cane how many minutes can you walk? 10 or less ability to climb steps?  yes do you drive?  yes  Function disabled: date disabled 10/2007 I need assistance with the following:  meal prep, household duties and shopping  Neuro/Psych trouble walking spasms  Prior Studies Any changes since last visit?  no  Physicians involved in your care Any changes since last visit?  no      Review of Systems  Musculoskeletal: Positive for back pain and gait problem.       Neck pain, having spasms in both neck and back  All other systems reviewed and are negative.       Objective:   Physical Exam  Constitutional: She is oriented to person, place, and time. She appears well-developed and well-nourished.  HENT:  Head: Normocephalic and atraumatic.  Musculoskeletal:       Lumbar back: She exhibits decreased range of motion, tenderness and pain. She exhibits no bony tenderness, no swelling and no edema.  Neurological: She is alert and oriented to person, place, and time.  Psychiatric: She has a normal mood and affect.       Assessment & Plan:  1. Lumbar postlaminectomy syndrome with chronic postoperative pain. There is been noticed  change in examination. We discussed trying to stay active. We discussed not using the back brace all the time. We'll continue the morphine sulfate extended release 3 times a day and the short acting once a day

## 2011-09-27 ENCOUNTER — Encounter: Payer: Self-pay | Admitting: Physical Medicine & Rehabilitation

## 2011-09-29 ENCOUNTER — Other Ambulatory Visit: Payer: Self-pay | Admitting: *Deleted

## 2011-09-29 MED ORDER — GABAPENTIN 300 MG PO CAPS: 300.0000 mg | ORAL_CAPSULE | Freq: Four times a day (QID) | ORAL | Status: DC

## 2011-10-20 ENCOUNTER — Encounter: Payer: Self-pay | Admitting: *Deleted

## 2011-10-20 ENCOUNTER — Encounter: Payer: Medicare Other | Attending: Physical Medicine & Rehabilitation | Admitting: *Deleted

## 2011-10-20 VITALS — BP 140/75 | HR 70 | Resp 16 | Ht 65.0 in | Wt 205.0 lb

## 2011-10-20 DIAGNOSIS — IMO0002 Reserved for concepts with insufficient information to code with codable children: Secondary | ICD-10-CM

## 2011-10-20 DIAGNOSIS — M5430 Sciatica, unspecified side: Secondary | ICD-10-CM

## 2011-10-20 DIAGNOSIS — M533 Sacrococcygeal disorders, not elsewhere classified: Secondary | ICD-10-CM

## 2011-10-20 DIAGNOSIS — G8929 Other chronic pain: Secondary | ICD-10-CM | POA: Insufficient documentation

## 2011-10-20 DIAGNOSIS — M961 Postlaminectomy syndrome, not elsewhere classified: Secondary | ICD-10-CM | POA: Insufficient documentation

## 2011-10-20 MED ORDER — MORPHINE SULFATE CR 15 MG PO TB12: 15.0000 mg | ORAL_TABLET | Freq: Three times a day (TID) | ORAL | Status: DC

## 2011-10-20 MED ORDER — MORPHINE SULFATE 15 MG PO TABS: 15.0000 mg | ORAL_TABLET | Freq: Every day | ORAL | Status: DC

## 2011-11-17 ENCOUNTER — Encounter: Payer: Self-pay | Admitting: Physical Medicine and Rehabilitation

## 2011-11-17 ENCOUNTER — Encounter: Payer: Medicare Other | Attending: Physical Medicine & Rehabilitation | Admitting: Physical Medicine and Rehabilitation

## 2011-11-17 VITALS — BP 121/71 | HR 60 | Resp 16 | Ht 66.0 in | Wt 202.0 lb

## 2011-11-17 DIAGNOSIS — M961 Postlaminectomy syndrome, not elsewhere classified: Secondary | ICD-10-CM

## 2011-11-17 DIAGNOSIS — G8929 Other chronic pain: Secondary | ICD-10-CM | POA: Insufficient documentation

## 2011-11-17 DIAGNOSIS — Z981 Arthrodesis status: Secondary | ICD-10-CM | POA: Insufficient documentation

## 2011-11-17 DIAGNOSIS — M545 Low back pain, unspecified: Secondary | ICD-10-CM

## 2011-11-17 DIAGNOSIS — K5090 Crohn's disease, unspecified, without complications: Secondary | ICD-10-CM | POA: Insufficient documentation

## 2011-11-17 DIAGNOSIS — M5430 Sciatica, unspecified side: Secondary | ICD-10-CM

## 2011-11-17 DIAGNOSIS — M79609 Pain in unspecified limb: Secondary | ICD-10-CM | POA: Insufficient documentation

## 2011-11-17 DIAGNOSIS — M533 Sacrococcygeal disorders, not elsewhere classified: Secondary | ICD-10-CM | POA: Insufficient documentation

## 2011-11-17 MED ORDER — MORPHINE SULFATE 15 MG PO TABS: 15.0000 mg | ORAL_TABLET | Freq: Every day | ORAL | Status: DC

## 2011-11-17 MED ORDER — MORPHINE SULFATE ER 15 MG PO TBCR: 15.0000 mg | EXTENDED_RELEASE_TABLET | Freq: Three times a day (TID) | ORAL | Status: DC

## 2011-11-17 NOTE — Patient Instructions (Signed)
Continue with medications. Advised patient to do water aerobics, or just walking in the water.

## 2011-11-17 NOTE — Progress Notes (Signed)
Subjective:    Patient ID: [name] [name], female    DOB: 16-Mar-1956, 56 y.o.   MRN: [id number]  HPI The patient complains about chronic low back  pain which radiates into  Her right LE.  The problem has been stable. The patient reports that she is very stressed because she has to take care of her elderly mother the who has dementia. She also complains about pain in her SI joint whenever she puts weight on it. I advised her to do exercises in the pool, this would be very beneficial for her low back pain and would also give her a break from caring for her demented mother. Pain Inventory Average Pain 10 Pain Right Now 10 My pain is sharp, stabbing and aching  In the last 24 hours, has pain interfered with the following? General activity 10 Relation with others 10 Enjoyment of life 10 What TIME of day is your pain at its worst? All Day Sleep (in general) Poor  Pain is worse with: walking, bending, sitting, inactivity, standing and some activites Pain improves with: heat/ice and medication Relief from Meds: 1  Mobility use a cane  Function not employed: date last employed 08/2006 disabled: date disabled 10/2007 I need assistance with the following:  meal prep, household duties and shopping  Neuro/Psych tingling trouble walking spasms  Prior Studies Any changes since last visit?  no  Physicians involved in your care Any changes since last visit?  no   Family History  Problem Relation Age of Onset  . Heart disease Mother   . Hypertension Mother    History   Social History  . Marital Status: Widowed    Spouse Name: N/A    Number of Children: N/A  . Years of Education: N/A   Social History Main Topics  . Smoking status: Former Smoker -- 0.5 packs/day for 20 years    Types: Cigarettes    Quit date: 07/21/2008  . Smokeless tobacco: Never Used  . Alcohol Use: No  . Drug Use: No  . Sexually Active: None   Other Topics Concern  . None   Social History Narrative    . None   Past Surgical History  Procedure Date  . Spine surgery   . Abdominal hysterectomy   . Colon surgery 05/11/11    [facility] Lysis of adhesions Crohn's   Past Medical History  Diagnosis Date  . Disorder of sacroiliac joint   . Lumbosacral neuritis   . Lumbosacral radiculitis   . Lumbar post-laminectomy syndrome   . Sciatica   . Lumbago   . Hypertension   . Crohn's disease    BP 121/71  Pulse 60  Resp 16  Ht 5\' 6"  (1.676 m)  Wt 202 lb (91.627 kg)  BMI 32.60 kg/m2  SpO2 97%      Review of Systems  Constitutional: Negative.   HENT: Negative.   Eyes: Negative.   Respiratory: Negative.   Cardiovascular: Negative.   Gastrointestinal: Negative.   Genitourinary: Negative.   Musculoskeletal: Positive for back pain and gait problem.  Skin: Negative.   Neurological: Negative.   Hematological: Negative.   Psychiatric/Behavioral: Negative.        Objective:   Physical Exam  Constitutional: She is oriented to person, place, and time. She appears well-developed and well-nourished.       Walks with cane  HENT:  Head: Normocephalic.  Eyes: Pupils are equal, round, and reactive to light.  Neurological: She is alert and oriented to person, place, and  time.  Skin: Skin is warm and dry.  Psychiatric: She has a normal mood and affect.    Symmetric normal motor tone is noted throughout. Normal muscle bulk. Muscle testing reveals 5/5 muscle strength of the upper extremity, and 5/5 of the lower extremity, except left iliopsoas 4-/5. Full range of motion in upper and lower extremities. Positive SLR on the right. ROM of spine is  restricted. DTR in the upper and lower extremity are present and symmetric 2+, except no patella tendon reflex on the right. No clonus is noted.  Patient arises from chair with difficulty. Wide based antalgic gait with a cane .         Assessment & Plan:  This is a 56 year old female with 1.LBP, s/p PSF L4-S1 2.SI dysfct 3.ACDF 4.Crohn's  disease Plan : Continue with medication. Advised patient to join the [employer] close to her home and do some water aerobics, which she has enjoyed in the past. Refilled pain medication. Follow up in one month.

## 2011-12-15 ENCOUNTER — Encounter: Payer: Medicare Other | Attending: Physical Medicine & Rehabilitation | Admitting: Physical Medicine and Rehabilitation

## 2011-12-15 ENCOUNTER — Encounter: Payer: Self-pay | Admitting: Physical Medicine and Rehabilitation

## 2011-12-15 VITALS — BP 129/79 | HR 61 | Resp 16 | Ht 66.0 in | Wt 204.4 lb

## 2011-12-15 DIAGNOSIS — M545 Low back pain, unspecified: Secondary | ICD-10-CM | POA: Insufficient documentation

## 2011-12-15 DIAGNOSIS — Z981 Arthrodesis status: Secondary | ICD-10-CM | POA: Insufficient documentation

## 2011-12-15 DIAGNOSIS — M79609 Pain in unspecified limb: Secondary | ICD-10-CM | POA: Insufficient documentation

## 2011-12-15 DIAGNOSIS — K5090 Crohn's disease, unspecified, without complications: Secondary | ICD-10-CM | POA: Insufficient documentation

## 2011-12-15 DIAGNOSIS — M961 Postlaminectomy syndrome, not elsewhere classified: Secondary | ICD-10-CM

## 2011-12-15 DIAGNOSIS — M533 Sacrococcygeal disorders, not elsewhere classified: Secondary | ICD-10-CM

## 2011-12-15 DIAGNOSIS — G8929 Other chronic pain: Secondary | ICD-10-CM | POA: Insufficient documentation

## 2011-12-15 MED ORDER — MORPHINE SULFATE ER 15 MG PO TBCR: 15.0000 mg | EXTENDED_RELEASE_TABLET | Freq: Three times a day (TID) | ORAL | Status: DC

## 2011-12-15 MED ORDER — GABAPENTIN 300 MG PO CAPS: 300.0000 mg | ORAL_CAPSULE | Freq: Four times a day (QID) | ORAL | Status: DC

## 2011-12-15 MED ORDER — MORPHINE SULFATE 15 MG PO TABS: 15.0000 mg | ORAL_TABLET | Freq: Every day | ORAL | Status: DC

## 2011-12-15 NOTE — Progress Notes (Signed)
Subjective:    Patient ID: [name] [name], female    DOB: 19-Dec-1955, 56 y.o.   MRN: [id number]  HPI The patient complains about chronic low back pain which radiates into her right LE.  The problem has been stable. The patient reports that she is very stressed because she has to take care of her elderly mother the who has dementia. She also complains about pain in her SI joint whenever she puts weight on it. I advised her again to do exercises in the pool, this would be very beneficial for her low back, and SI pain and would also give her a break from caring for her demented mother.  Pain Inventory Average Pain 10 Pain Right Now 10 My pain is constant, sharp, stabbing and aching  In the last 24 hours, has pain interfered with the following? General activity 10 Relation with others 10 Enjoyment of life 10 What TIME of day is your pain at its worst? constant Sleep (in general) Poor  Pain is worse with: walking, bending, sitting, inactivity and standing Pain improves with: rest and medication Relief from Meds: 1  Mobility use a cane ability to climb steps?  yes do you drive?  yes  Function disabled: date disabled 10/2007 I need assistance with the following:  meal prep, household duties and shopping  Neuro/Psych trouble walking spasms  Prior Studies Any changes since last visit?  no  Physicians involved in your care Any changes since last visit?  no   Family History  Problem Relation Age of Onset  . Heart disease Mother   . Hypertension Mother    History   Social History  . Marital Status: Widowed    Spouse Name: N/A    Number of Children: N/A  . Years of Education: N/A   Social History Main Topics  . Smoking status: Former Smoker -- 0.5 packs/day for 20 years    Types: Cigarettes    Quit date: 07/21/2008  . Smokeless tobacco: Never Used  . Alcohol Use: No  . Drug Use: No  . Sexually Active: None   Other Topics Concern  . None   Social History  Narrative  . None   Past Surgical History  Procedure Date  . Spine surgery   . Abdominal hysterectomy   . Colon surgery 05/11/11    [facility] Lysis of adhesions Crohn's   Past Medical History  Diagnosis Date  . Disorder of sacroiliac joint   . Lumbosacral neuritis   . Lumbosacral radiculitis   . Lumbar post-laminectomy syndrome   . Sciatica   . Lumbago   . Hypertension   . Crohn's disease    BP 129/79  Pulse 61  Resp 16  Ht 5\' 6"  (1.676 m)  Wt 204 lb 6.4 oz (92.715 kg)  BMI 32.99 kg/m2  SpO2 98%    Review of Systems  Musculoskeletal: Positive for back pain and gait problem.       Spasms  All other systems reviewed and are negative.       Objective:   Physical Exam  Musculoskeletal: She exhibits tenderness.   Constitutional: She is oriented to person, place, and time. She appears well-developed and well-nourished.  Walks with cane  HENT:  Head: Normocephalic.  Eyes: Pupils are equal, round, and reactive to light.  Neurological: She is alert and oriented to person, place, and time.  Skin: Skin is warm and dry.  Psychiatric: She has a normal mood and affect.   Symmetric normal motor tone is noted  throughout. Normal muscle bulk. Muscle testing reveals 5/5 muscle strength of the upper extremity, and 5/5 of the lower extremity, except left iliopsoas 4-/5. Full range of motion in upper and lower extremities. Positive SLR on the right. ROM of spine is restricted.  DTR in the upper and lower extremity are present and symmetric 2+, except no patella tendon reflex on the right. No clonus is noted.  Patient arises from chair with difficulty. Wide based antalgic gait with a cane .         Assessment & Plan:  This is a 56 year old female with  1.LBP, s/p PSF L4-S1  2.SI dysfct  3.ACDF  4.Crohn's disease  Plan :  Continue with medication. Advised patient again  to join the [employer] close to her home and do some water aerobics, which she has enjoyed in the past. She states,  that she will start after she gets paid in the beginning of the month. Refilled pain medication.  Follow up in one month.

## 2011-12-15 NOTE — Patient Instructions (Signed)
Continue staying active, start with exercising in the pool. Continue with your medication.

## 2011-12-20 ENCOUNTER — Other Ambulatory Visit: Payer: Self-pay | Admitting: Physical Medicine & Rehabilitation

## 2012-01-11 ENCOUNTER — Encounter
Payer: Medicare Other | Attending: Physical Medicine and Rehabilitation | Admitting: Physical Medicine and Rehabilitation

## 2012-01-11 ENCOUNTER — Encounter: Payer: Self-pay | Admitting: Physical Medicine and Rehabilitation

## 2012-01-11 VITALS — BP 117/57 | HR 60 | Resp 16 | Ht 66.0 in | Wt 201.0 lb

## 2012-01-11 DIAGNOSIS — Z981 Arthrodesis status: Secondary | ICD-10-CM | POA: Insufficient documentation

## 2012-01-11 DIAGNOSIS — K5090 Crohn's disease, unspecified, without complications: Secondary | ICD-10-CM | POA: Insufficient documentation

## 2012-01-11 DIAGNOSIS — M533 Sacrococcygeal disorders, not elsewhere classified: Secondary | ICD-10-CM | POA: Insufficient documentation

## 2012-01-11 DIAGNOSIS — M79609 Pain in unspecified limb: Secondary | ICD-10-CM | POA: Insufficient documentation

## 2012-01-11 DIAGNOSIS — M961 Postlaminectomy syndrome, not elsewhere classified: Secondary | ICD-10-CM

## 2012-01-11 DIAGNOSIS — M545 Low back pain, unspecified: Secondary | ICD-10-CM

## 2012-01-11 DIAGNOSIS — G8929 Other chronic pain: Secondary | ICD-10-CM | POA: Insufficient documentation

## 2012-01-11 MED ORDER — MORPHINE SULFATE ER 15 MG PO TBCR: 15.0000 mg | EXTENDED_RELEASE_TABLET | Freq: Three times a day (TID) | ORAL | Status: DC

## 2012-01-11 MED ORDER — MORPHINE SULFATE 15 MG PO TABS: 15.0000 mg | ORAL_TABLET | Freq: Every day | ORAL | Status: DC

## 2012-01-11 NOTE — Patient Instructions (Signed)
Try to do water aerobics, continue staying active

## 2012-01-11 NOTE — Progress Notes (Signed)
Subjective:    Patient ID: [name] [name], female    DOB: 1956/06/02, 55 y.o.   MRN: [id number]  HPI The patient complains about chronic low back pain which radiates into her right LE.  The problem has been stable. The patient reports that she is very stressed because she has to take care of her elderly mother the who has dementia. She also complains about pain in her SI joint whenever she puts weight on it. I advised her again to do exercises in the pool, this would be very beneficial for her low back, and SI pain and would also give her a break from caring for her demented mother.  Pain Inventory Average Pain 10 Pain Right Now 10 My pain is constant, sharp, burning, stabbing and aching  In the last 24 hours, has pain interfered with the following? General activity 10 Relation with others 10 Enjoyment of life 10 What TIME of day is your pain at its worst? All Day Sleep (in general) Poor  Pain is worse with: walking, bending, sitting, inactivity and standing Pain improves with: heat/ice and medication Relief from Meds: 1  Mobility use a cane how many minutes can you walk? 10 do you drive?  yes use a wheelchair  Function disabled: date disabled 2009 I need assistance with the following:  meal prep, household duties and shopping  Neuro/Psych trouble walking spasms  Prior Studies Any changes since last visit?  no  Physicians involved in your care Any changes since last visit?  no   Family History  Problem Relation Age of Onset  . Heart disease Mother   . Hypertension Mother    History   Social History  . Marital Status: Widowed    Spouse Name: N/A    Number of Children: N/A  . Years of Education: N/A   Social History Main Topics  . Smoking status: Former Smoker -- 0.5 packs/day for 20 years    Types: Cigarettes    Quit date: 07/21/2008  . Smokeless tobacco: Never Used  . Alcohol Use: No  . Drug Use: No  . Sexually Active: None   Other Topics Concern    . None   Social History Narrative  . None   Past Surgical History  Procedure Date  . Spine surgery   . Abdominal hysterectomy   . Colon surgery 05/11/11    D[facility] Lysis of adhesions Crohn's   Past Medical History  Diagnosis Date  . Disorder of sacroiliac joint   . Lumbosacral neuritis   . Lumbosacral radiculitis   . Lumbar post-laminectomy syndrome   . Sciatica   . Lumbago   . Hypertension   . Crohn's disease    BP 117/57  Pulse 60  Resp 16  Ht 5\' 6"  (1.676 m)  Wt 201 lb (91.173 kg)  BMI 32.44 kg/m2  SpO2 98%      Review of Systems  Constitutional: Positive for appetite change.  HENT: Negative.   Eyes: Negative.   Respiratory: Negative.   Cardiovascular: Negative.   Gastrointestinal: Positive for abdominal pain.  Genitourinary: Negative.   Musculoskeletal: Positive for back pain and gait problem.  Skin: Negative.   Neurological: Negative.        Spasms  Hematological: Negative.   Psychiatric/Behavioral: Negative.        Objective:   Physical Exam Musculoskeletal: She exhibits tenderness.   Constitutional: She is oriented to person, place, and time. She appears well-developed and well-nourished.  Walks with cane  HENT:  Head: Normocephalic.  Eyes: Pupils are equal, round, and reactive to light.  Neurological: She is alert and oriented to person, place, and time.  Skin: Skin is warm and dry.  Psychiatric: She has a normal mood and affect.  Symmetric normal motor tone is noted throughout. Normal muscle bulk. Muscle testing reveals 5/5 muscle strength of the upper extremity, and 5/5 of the lower extremity, except left iliopsoas 4-/5. Full range of motion in upper and lower extremities. Positive SLR on the right. ROM of spine is restricted.  DTR in the upper and lower extremity are present and symmetric 2+, except no patella tendon reflex on the right. No clonus is noted.  Patient arises from chair with difficulty. Wide based antalgic gait with a cane          Assessment & Plan:  This is a 56 year old female with  1.LBP, s/p PSF L4-S1  2.SI dysfct  3.ACDF  4.Crohn's disease  Plan :  Continue with medication. Advised patient again to join the [employer] close to her home and do some water aerobics, which she has enjoyed in the past. She states, that she will start soon.  Refilled pain medication.  Follow up in one month.

## 2012-01-25 ENCOUNTER — Emergency Department: Payer: Self-pay

## 2012-01-25 LAB — CBC
HCT: 36.3 % (ref 35.0–47.0)
MCH: 28.7 pg (ref 26.0–34.0)
MCHC: 33.4 g/dL (ref 32.0–36.0)
Platelet: 218 10*3/uL (ref 150–440)
RBC: 4.22 10*6/uL (ref 3.80–5.20)
RDW: 13.6 % (ref 11.5–14.5)
WBC: 5.8 10*3/uL (ref 3.6–11.0)

## 2012-01-25 LAB — BASIC METABOLIC PANEL
Anion Gap: 5 — ABNORMAL LOW (ref 7–16)
BUN: 9 mg/dL (ref 7–18)
Calcium, Total: 8.8 mg/dL (ref 8.5–10.1)
Chloride: 103 mmol/L (ref 98–107)
Creatinine: 0.98 mg/dL (ref 0.60–1.30)
EGFR (African American): >60
Osmolality: 276 (ref 275–301)

## 2012-01-25 LAB — TROPONIN I: Troponin-I: <0.02 ng/mL

## 2012-01-26 DIAGNOSIS — M542 Cervicalgia: Secondary | ICD-10-CM | POA: Insufficient documentation

## 2012-02-13 ENCOUNTER — Encounter: Payer: Self-pay | Admitting: Physical Medicine and Rehabilitation

## 2012-02-13 ENCOUNTER — Encounter
Payer: Medicare Other | Attending: Physical Medicine and Rehabilitation | Admitting: Physical Medicine and Rehabilitation

## 2012-02-13 VITALS — BP 133/84 | HR 61 | Resp 14 | Ht 66.0 in | Wt 203.0 lb

## 2012-02-13 DIAGNOSIS — Z981 Arthrodesis status: Secondary | ICD-10-CM | POA: Insufficient documentation

## 2012-02-13 DIAGNOSIS — M545 Low back pain, unspecified: Secondary | ICD-10-CM | POA: Insufficient documentation

## 2012-02-13 DIAGNOSIS — K5090 Crohn's disease, unspecified, without complications: Secondary | ICD-10-CM | POA: Insufficient documentation

## 2012-02-13 DIAGNOSIS — M961 Postlaminectomy syndrome, not elsewhere classified: Secondary | ICD-10-CM

## 2012-02-13 DIAGNOSIS — G8928 Other chronic postprocedural pain: Secondary | ICD-10-CM | POA: Insufficient documentation

## 2012-02-13 DIAGNOSIS — M533 Sacrococcygeal disorders, not elsewhere classified: Secondary | ICD-10-CM | POA: Insufficient documentation

## 2012-02-13 DIAGNOSIS — I10 Essential (primary) hypertension: Secondary | ICD-10-CM | POA: Insufficient documentation

## 2012-02-13 MED ORDER — MORPHINE SULFATE 15 MG PO TABS: 15.0000 mg | ORAL_TABLET | Freq: Every day | ORAL | Status: DC

## 2012-02-13 MED ORDER — MORPHINE SULFATE ER 15 MG PO TBCR: 15.0000 mg | EXTENDED_RELEASE_TABLET | Freq: Three times a day (TID) | ORAL | Status: DC

## 2012-02-13 NOTE — Patient Instructions (Signed)
Follow up with your cardiologist, also ask about whether he thinks that water aerobics would be beneficial for you.

## 2012-02-13 NOTE — Progress Notes (Signed)
Subjective:    Patient ID: [name] [name], female    DOB: Apr 14, 1956, 56 y.o.   MRN: [id number]  HPI The patient complains about chronic low back pain which radiates into her right LE.  The problem has been stable. The patient reports that she is very stressed because she has to take care of her elderly mother the who has dementia. She reports, that she went to the ED on 01/26/2012 for Chest Pain, all tests were negative, except her EKG showed some changes, she states, that she will follow up with a cardiologist at [facility] this week.  Pain Inventory Average Pain 10 Pain Right Now 10 My pain is constant, sharp, stabbing and aching  In the last 24 hours, has pain interfered with the following? General activity 10 Relation with others 10 Enjoyment of life 10 What TIME of day is your pain at its worst? all the time Sleep (in general) Poor  Pain is worse with: walking, bending, sitting, inactivity, standing and some activites Pain improves with: rest, heat/ice and medication Relief from Meds: 1  Mobility use a cane how many minutes can you walk? 10 ability to climb steps?  yes use a wheelchair Do you have any goals in this area?  yes  Function disabled: date disabled 2008 I need assistance with the following:  meal prep, household duties and shopping Do you have any goals in this area?  yes  Neuro/Psych trouble walking spasms  Prior Studies Any changes since last visit?  no  Physicians involved in your care Any changes since last visit?  no   Family History  Problem Relation Age of Onset  . Heart disease Mother   . Hypertension Mother    History   Social History  . Marital Status: Widowed    Spouse Name: N/A    Number of Children: N/A  . Years of Education: N/A   Social History Main Topics  . Smoking status: Former Smoker -- 0.5 packs/day for 20 years    Types: Cigarettes    Quit date: 07/21/2008  . Smokeless tobacco: Never Used  . Alcohol Use: No  . Drug  Use: No  . Sexually Active: None   Other Topics Concern  . None   Social History Narrative  . None   Past Surgical History  Procedure Date  . Spine surgery   . Abdominal hysterectomy   . Colon surgery 05/11/11    [facility] Lysis of adhesions Crohn's   Past Medical History  Diagnosis Date  . Disorder of sacroiliac joint   . Lumbosacral neuritis   . Lumbosacral radiculitis   . Lumbar post-laminectomy syndrome   . Sciatica   . Lumbago   . Hypertension   . Crohn's disease    BP 133/84  Pulse 61  Resp 14  Ht 5\' 6"  (1.676 m)  Wt 203 lb (92.08 kg)  BMI 32.76 kg/m2  SpO2 98%     Review of Systems  Musculoskeletal: Positive for myalgias, back pain, arthralgias and gait problem.  All other systems reviewed and are negative.       Objective:   Physical Exam Constitutional: She is oriented to person, place, and time. She appears well-developed and well-nourished.  Walks with cane  HENT:  Head: Normocephalic.  Eyes: Pupils are equal, round, and reactive to light.  Neurological: She is alert and oriented to person, place, and time.  Skin: Skin is warm and dry.  Psychiatric: She has a normal mood and affect.  Symmetric normal motor  tone is noted throughout. Normal muscle bulk. Muscle testing reveals 5/5 muscle strength of the upper extremity, and 5/5 of the lower extremity, except left iliopsoas 4-/5. Full range of motion in upper and lower extremities. Positive SLR on the right. ROM of spine is restricted.  DTR in the upper and lower extremity are present and symmetric 2+, except no patella tendon reflex on the right. No clonus is noted.  Patient arises from chair with difficulty. 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 Advised patient again to join the [employer] close to her home and do some water aerobics, after she  had seen her cardiologist, and he has approved her exercising in the water.  Refilled pain medication.  Follow up in one month.

## 2012-03-12 ENCOUNTER — Encounter
Payer: Medicare Other | Attending: Physical Medicine and Rehabilitation | Admitting: Physical Medicine and Rehabilitation

## 2012-03-12 ENCOUNTER — Encounter: Payer: Self-pay | Admitting: Physical Medicine and Rehabilitation

## 2012-03-12 VITALS — BP 140/89 | HR 82 | Resp 16 | Ht 66.0 in | Wt 202.0 lb

## 2012-03-12 DIAGNOSIS — Z981 Arthrodesis status: Secondary | ICD-10-CM | POA: Insufficient documentation

## 2012-03-12 DIAGNOSIS — Z5181 Encounter for therapeutic drug level monitoring: Secondary | ICD-10-CM

## 2012-03-12 DIAGNOSIS — M545 Low back pain, unspecified: Secondary | ICD-10-CM | POA: Insufficient documentation

## 2012-03-12 DIAGNOSIS — M533 Sacrococcygeal disorders, not elsewhere classified: Secondary | ICD-10-CM | POA: Insufficient documentation

## 2012-03-12 DIAGNOSIS — K5090 Crohn's disease, unspecified, without complications: Secondary | ICD-10-CM | POA: Insufficient documentation

## 2012-03-12 DIAGNOSIS — M961 Postlaminectomy syndrome, not elsewhere classified: Secondary | ICD-10-CM

## 2012-03-12 MED ORDER — MORPHINE SULFATE 15 MG PO TABS: 15.0000 mg | ORAL_TABLET | Freq: Every day | ORAL | Status: DC

## 2012-03-12 MED ORDER — MORPHINE SULFATE ER 15 MG PO TBCR: 15.0000 mg | EXTENDED_RELEASE_TABLET | Freq: Three times a day (TID) | ORAL | Status: DC

## 2012-03-12 NOTE — Progress Notes (Signed)
Subjective:    Patient ID: [name] [name], female    DOB: 1955/06/25, 56 y.o.   MRN: [id number]  HPI The patient complains about chronic low back pain which radiates into her right LE.  The problem has been stable. The patient reports that she is very stressed because she has to take care of her elderly mother the who has dementia. She reports, that she went to the ED on 01/26/2012 for Chest Pain, all tests were negative, except her EKG showed some changes, she states, that she tried to follow up with a cardiologist at [facility], 3 times,but she always missed her appointment, because of car troubles or other incidences.   Pain Inventory Average Pain 10 Pain Right Now 10 My pain is constant, sharp, stabbing and aching  In the last 24 hours, has pain interfered with the following? General activity 10 Relation with others 10 Enjoyment of life 10 What TIME of day is your pain at its worst? All Day Sleep (in general) Poor  Pain is worse with: walking, bending, sitting, inactivity and standing Pain improves with: rest, pacing activities and medication Relief from Meds: 2  Mobility use a cane  Function I need assistance with the following:  meal prep, household duties and shopping  Neuro/Psych trouble walking spasms  Prior Studies Any changes since last visit?  no  Physicians involved in your care Any changes since last visit?  no   Family History  Problem Relation Age of Onset  . Heart disease Mother   . Hypertension Mother    History   Social History  . Marital Status: Widowed    Spouse Name: N/A    Number of Children: N/A  . Years of Education: N/A   Social History Main Topics  . Smoking status: Former Smoker -- 0.5 packs/day for 20 years    Types: Cigarettes    Quit date: 07/21/2008  . Smokeless tobacco: Never Used  . Alcohol Use: No  . Drug Use: No  . Sexually Active: None   Other Topics Concern  . None   Social History Narrative  . None   Past Surgical  History  Procedure Date  . Spine surgery   . Abdominal hysterectomy   . Colon surgery 05/11/11    [facility] Lysis of adhesions Crohn's   Past Medical History  Diagnosis Date  . Disorder of sacroiliac joint   . Lumbosacral neuritis   . Lumbosacral radiculitis   . Lumbar post-laminectomy syndrome   . Sciatica   . Lumbago   . Hypertension   . Crohn's disease    Ht 5\' 6"  (1.676 m)  Wt 202 lb (91.627 kg)  BMI 32.60 kg/m2      Review of Systems  Constitutional: Negative.   HENT: Negative.   Eyes: Negative.   Respiratory: Negative.   Cardiovascular: Negative.   Gastrointestinal: Negative.   Genitourinary: Negative.   Musculoskeletal: Positive for back pain and gait problem.  Skin: Negative.   Neurological: Negative.   Hematological: Negative.   Psychiatric/Behavioral: Negative.        Objective:   Physical Exam Constitutional: She is oriented to person, place, and time. She appears well-developed and well-nourished.  Walks with cane  HENT:  Head: Normocephalic.  Eyes: Pupils are equal, round, and reactive to light.  Neurological: She is alert and oriented to person, place, and time.  Skin: Skin is warm and dry.  Psychiatric: She has a normal mood and affect.  Symmetric normal motor tone is noted throughout. Normal  muscle bulk. Muscle testing reveals 5/5 muscle strength of the upper extremity, and 5/5 of the lower extremity, except left iliopsoas 4-/5. Full range of motion in upper and lower extremities. Positive SLR on the right. ROM of spine is restricted.  DTR in the upper and lower extremity are present and symmetric 2+, except no patella tendon reflex on the right. No clonus is noted.  Patient arises from chair with difficulty. 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 Advised patient again to join the [employer]  close to her home and do some water aerobics, after she had seen her cardiologist,which she has not done yet, and he has approved her exercising in the water. I advised her to see her cardiologist , strongly. Refilled pain medication.  The patient also reports, that she will see another spine surgeon for a second opinion. Follow up in one month.

## 2012-03-12 NOTE — Patient Instructions (Signed)
Follow up with your cardiologist, consider aquatic exercises if the cardiologist approves.

## 2012-03-12 NOTE — Addendum Note (Signed)
Addended by: Judd Gaudier on: 03/12/2012 10:53 AM   Modules accepted: Orders

## 2012-04-09 ENCOUNTER — Encounter: Payer: Self-pay | Admitting: Physical Medicine and Rehabilitation

## 2012-04-09 ENCOUNTER — Encounter
Payer: Medicare Other | Attending: Physical Medicine and Rehabilitation | Admitting: Physical Medicine and Rehabilitation

## 2012-04-09 VITALS — BP 149/78 | HR 75 | Resp 16 | Ht 66.0 in | Wt 205.4 lb

## 2012-04-09 DIAGNOSIS — I10 Essential (primary) hypertension: Secondary | ICD-10-CM | POA: Insufficient documentation

## 2012-04-09 DIAGNOSIS — M533 Sacrococcygeal disorders, not elsewhere classified: Secondary | ICD-10-CM | POA: Insufficient documentation

## 2012-04-09 DIAGNOSIS — K5090 Crohn's disease, unspecified, without complications: Secondary | ICD-10-CM | POA: Insufficient documentation

## 2012-04-09 DIAGNOSIS — R0789 Other chest pain: Secondary | ICD-10-CM | POA: Insufficient documentation

## 2012-04-09 DIAGNOSIS — E785 Hyperlipidemia, unspecified: Secondary | ICD-10-CM | POA: Insufficient documentation

## 2012-04-09 DIAGNOSIS — M545 Low back pain, unspecified: Secondary | ICD-10-CM | POA: Insufficient documentation

## 2012-04-09 DIAGNOSIS — Z981 Arthrodesis status: Secondary | ICD-10-CM | POA: Insufficient documentation

## 2012-04-09 DIAGNOSIS — M961 Postlaminectomy syndrome, not elsewhere classified: Secondary | ICD-10-CM

## 2012-04-09 DIAGNOSIS — G8929 Other chronic pain: Secondary | ICD-10-CM | POA: Insufficient documentation

## 2012-04-09 DIAGNOSIS — R079 Chest pain, unspecified: Secondary | ICD-10-CM | POA: Insufficient documentation

## 2012-04-09 MED ORDER — MORPHINE SULFATE ER 15 MG PO TBCR: 15.0000 mg | EXTENDED_RELEASE_TABLET | Freq: Three times a day (TID) | ORAL | Status: DC

## 2012-04-09 MED ORDER — MORPHINE SULFATE 15 MG PO TABS: 15.0000 mg | ORAL_TABLET | Freq: Every day | ORAL | Status: DC

## 2012-04-09 NOTE — Progress Notes (Signed)
Subjective:    Patient ID: [name] [name], female    DOB: [birth year], 56 y.o.   MRN: [id number]  HPI The patient complains about chronic low back pain which radiates into her right LE.  The problem has been stable. The patient reports that she is very stressed because she has to take care of her elderly mother the who has dementia. She reports, that she went to the ED on 01/26/2012 for Chest Pain, all tests were negative, except her EKG showed some changes, she states, that she tried to follow up with a cardiologist at [facility], 3 times,but she always missed her appointment, because of car troubles or other incidences. She finally went to the cardiologist, and will follow up with her for further tests.She has not gotten a Dx yet.  Pain Inventory Average Pain 10 Pain Right Now 10 My pain is constant, sharp, stabbing and aching  In the last 24 hours, has pain interfered with the following? General activity 10 Relation with others 10 Enjoyment of life 10 What TIME of day is your pain at its worst? all of the time Sleep (in general) Poor  Pain is worse with: walking, bending, sitting, inactivity, standing and some activites Pain improves with: rest and medication Relief from Meds: 2  Mobility use a cane how many minutes can you walk? 10 ability to climb steps?  yes do you drive?  yes  Function disabled: date disabled 2008 I need assistance with the following:  meal prep, household duties and shopping  Neuro/Psych trouble walking spasms  Prior Studies Any changes since last visit?  no  Physicians involved in your care Any changes since last visit?  no   Family History  Problem Relation Age of Onset  . Heart disease Mother   . Hypertension Mother    History   Social History  . Marital Status: Widowed    Spouse Name: N/A    Number of Children: N/A  . Years of Education: N/A   Social History Main Topics  . Smoking status: Former Smoker -- 0.5 packs/day for 20 years   Types: Cigarettes    Quit date: 07/21/2008  . Smokeless tobacco: Never Used  . Alcohol Use: No  . Drug Use: No  . Sexually Active: None   Other Topics Concern  . None   Social History Narrative  . None   Past Surgical History  Procedure Date  . Spine surgery   . Abdominal hysterectomy   . Colon surgery 05/11/11    D[facility] Lysis of adhesions Crohn's   Past Medical History  Diagnosis Date  . Disorder of sacroiliac joint   . Lumbosacral neuritis   . Lumbosacral radiculitis   . Lumbar post-laminectomy syndrome   . Sciatica   . Lumbago   . Hypertension   . Crohn's disease    BP 149/78  Pulse 75  Resp 16  Ht 5\' 6"  (1.676 m)  Wt 205 lb 6.4 oz (93.169 kg)  BMI 33.15 kg/m2  SpO2 99%    Review of Systems  Musculoskeletal: Positive for back pain and gait problem.       Spasms  All other systems reviewed and are negative.       Objective:   Physical Exam Constitutional: She is oriented to person, place, and time. She appears well-developed and well-nourished.  Walks with cane  HENT:  Head: Normocephalic.  Eyes: Pupils are equal, round, and reactive to light.  Neurological: She is alert and oriented to person, place, and time.  Skin: Skin is warm and dry.  Psychiatric: She has a normal mood and affect.  Symmetric normal motor tone is noted throughout. Normal muscle bulk. Muscle testing reveals 5/5 muscle strength of the upper extremity, and 5/5 of the lower extremity, except left iliopsoas 4-/5. Full range of motion in upper and lower extremities. Positive SLR on the right. ROM of spine is restricted.  DTR in the upper and lower extremity are present and symmetric 2+, except no patella tendon reflex on the right. No clonus is noted.  Patient arises from chair with difficulty. Wide based antalgic gait with a cane         Assessment & Plan:  This is a 56 year old female with  1.LBP, s/p PSF L4-S1  2.SI dysfct  3.ACDF  4.Crohn's disease  Plan :  Continue with  medication. Advised patient again to join the [employer] close to her home and do some water aerobics, after she had seen her cardiologist,which she has not done yet, and he has approved her exercising in the water. I advised her to talk to her Cardiologist about her increasing BP, it has increased slowly from around 133/80 to 149/78 during the last 3 month. I also advised her to talk to her PCP about this.I advised her also to talk to her Cardiologist about whether exercising in the water would be an option for her.  Refilled pain medication.  The patient also reports, that she will see another spine surgeon for a second opinion at one point.  Follow up in one month.

## 2012-04-09 NOTE — Patient Instructions (Signed)
Try to walk as much as tolerated, and continue with your exercising.

## 2012-04-19 ENCOUNTER — Emergency Department: Payer: Self-pay | Admitting: Emergency Medicine

## 2012-04-21 ENCOUNTER — Observation Stay (HOSPITAL_COMMUNITY)
Admission: EM | Admit: 2012-04-21 | Discharge: 2012-04-24 | Disposition: A | Discharge status: Home / Self Care | Payer: Medicare Other | Attending: Internal Medicine | Admitting: Internal Medicine

## 2012-04-21 ENCOUNTER — Emergency Department (HOSPITAL_COMMUNITY): Payer: Medicare Other

## 2012-04-21 ENCOUNTER — Encounter (HOSPITAL_COMMUNITY): Payer: Self-pay | Admitting: Emergency Medicine

## 2012-04-21 DIAGNOSIS — E876 Hypokalemia: Secondary | ICD-10-CM | POA: Insufficient documentation

## 2012-04-21 DIAGNOSIS — R109 Unspecified abdominal pain: Secondary | ICD-10-CM | POA: Insufficient documentation

## 2012-04-21 DIAGNOSIS — M961 Postlaminectomy syndrome, not elsewhere classified: Secondary | ICD-10-CM | POA: Diagnosis present

## 2012-04-21 DIAGNOSIS — I10 Essential (primary) hypertension: Secondary | ICD-10-CM | POA: Diagnosis present

## 2012-04-21 DIAGNOSIS — K5090 Crohn's disease, unspecified, without complications: Secondary | ICD-10-CM | POA: Diagnosis present

## 2012-04-21 DIAGNOSIS — I498 Other specified cardiac arrhythmias: Secondary | ICD-10-CM | POA: Insufficient documentation

## 2012-04-21 DIAGNOSIS — M549 Dorsalgia, unspecified: Secondary | ICD-10-CM | POA: Insufficient documentation

## 2012-04-21 DIAGNOSIS — R42 Dizziness and giddiness: Principal | ICD-10-CM | POA: Diagnosis present

## 2012-04-21 DIAGNOSIS — R110 Nausea: Secondary | ICD-10-CM | POA: Insufficient documentation

## 2012-04-21 LAB — CBC WITH DIFFERENTIAL/PLATELET
Basophils Absolute: 0 10*3/uL (ref 0.0–0.1)
Eosinophils Relative: 1 % (ref 0–5)
Lymphocytes Relative: 34 % (ref 12–46)
MCV: 84.6 fL (ref 78.0–100.0)
Platelets: 248 10*3/uL (ref 150–400)
RDW: 13 % (ref 11.5–15.5)
WBC: 5.3 10*3/uL (ref 4.0–10.5)

## 2012-04-21 LAB — BASIC METABOLIC PANEL
CO2: 29 mEq/L (ref 19–32)
Calcium: 9.2 mg/dL (ref 8.4–10.5)
Creatinine, Ser: 0.85 mg/dL (ref 0.50–1.10)
GFR calc Af Amer: 88 mL/min — ABNORMAL LOW (ref >90)

## 2012-04-21 MED ORDER — IOHEXOL 300 MG/ML  SOLN
100.0000 mL | Freq: Once | INTRAMUSCULAR | Status: AC | PRN
  Administered 2012-04-21: 100 mL via INTRAVENOUS

## 2012-04-21 MED ORDER — ONDANSETRON HCL 4 MG/2ML IJ SOLN
4.0000 mg | Freq: Once | INTRAMUSCULAR | Status: AC
  Administered 2012-04-21: 4 mg via INTRAVENOUS
  Filled 2012-04-21: qty 2

## 2012-04-21 MED ORDER — POTASSIUM CHLORIDE 10 MEQ/100ML IV SOLN
10.0000 meq | Freq: Once | INTRAVENOUS | Status: AC
  Administered 2012-04-21: 10 meq via INTRAVENOUS
  Filled 2012-04-21: qty 100

## 2012-04-21 MED ORDER — SODIUM CHLORIDE 0.9 % IV SOLN
Freq: Once | INTRAVENOUS | Status: AC
  Administered 2012-04-21: 125 mL/h via INTRAVENOUS

## 2012-04-21 MED ORDER — MECLIZINE HCL 25 MG PO TABS
25.0000 mg | ORAL_TABLET | Freq: Once | ORAL | Status: AC
  Administered 2012-04-21: 25 mg via ORAL
  Filled 2012-04-21: qty 1

## 2012-04-21 MED ORDER — LORAZEPAM 2 MG/ML IJ SOLN
1.0000 mg | Freq: Once | INTRAMUSCULAR | Status: AC
  Administered 2012-04-21: 1 mg via INTRAVENOUS
  Filled 2012-04-21: qty 1

## 2012-04-21 MED ORDER — SODIUM CHLORIDE 0.9 % IV BOLUS (SEPSIS)
500.0000 mL | Freq: Once | INTRAVENOUS | Status: AC
  Administered 2012-04-21: 500 mL via INTRAVENOUS

## 2012-04-21 MED ORDER — IOHEXOL 350 MG/ML SOLN
50.0000 mL | Freq: Once | INTRAVENOUS | Status: AC | PRN
  Administered 2012-04-21: 50 mL via INTRAVENOUS
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
## 2012-04-21 NOTE — ED Notes (Signed)
CT notified of IV placement. 

## 2012-04-21 NOTE — ED Notes (Signed)
Patient given a meal
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
## 2012-04-21 NOTE — ED Provider Notes (Signed)
CTA results reviewed, discussed with Dr. Manus Gunning and shared with patient.  Patient continues to report persistent dizziness with nausea with position change.  No horizontal or vertical nystagmus noted.  Will re-dose with ativan and meclizine for symptom management.    12:22 AM Spoke with neurologist (Stewart)--advises to give 2 mg diazepam--if successful, may discharge home with 1 mg tid prn.  If no improvement, admit.  Jimmye Norman, NP 04/22/12 [phone]

## 2012-04-22 ENCOUNTER — Encounter (HOSPITAL_COMMUNITY): Payer: Self-pay | Admitting: *Deleted

## 2012-04-22 ENCOUNTER — Emergency Department (HOSPITAL_COMMUNITY): Payer: Medicare Other

## 2012-04-22 DIAGNOSIS — R42 Dizziness and giddiness: Secondary | ICD-10-CM | POA: Diagnosis present

## 2012-04-22 LAB — TROPONIN I: Troponin I: <0.3 ng/mL (ref <0.30)

## 2012-04-22 MED ORDER — ACETAMINOPHEN 325 MG PO TABS
650.0000 mg | ORAL_TABLET | Freq: Four times a day (QID) | ORAL | Status: DC | PRN
  Administered 2012-04-23 – 2012-04-24 (×2): 650 mg via ORAL
  Filled 2012-04-22 (×2): qty 2

## 2012-04-22 MED ORDER — ACETAMINOPHEN 650 MG RE SUPP: 650.0000 mg | Freq: Four times a day (QID) | RECTAL | Status: DC | PRN

## 2012-04-22 MED ORDER — ENOXAPARIN SODIUM 40 MG/0.4ML ~~LOC~~ SOLN
40.0000 mg | SUBCUTANEOUS | Status: DC
  Administered 2012-04-22 – 2012-04-23 (×2): 40 mg via SUBCUTANEOUS
  Filled 2012-04-22 (×3): qty 0.4

## 2012-04-22 MED ORDER — ONDANSETRON HCL 4 MG/2ML IJ SOLN
INTRAMUSCULAR | Status: AC
  Administered 2012-04-23: 4 mg via INTRAVENOUS
  Filled 2012-04-22: qty 2

## 2012-04-22 MED ORDER — HYDROCHLOROTHIAZIDE 25 MG PO TABS
25.0000 mg | ORAL_TABLET | Freq: Every day | ORAL | Status: DC
  Administered 2012-04-22 – 2012-04-24 (×3): 25 mg via ORAL
  Filled 2012-04-22 (×3): qty 1

## 2012-04-22 MED ORDER — MORPHINE SULFATE 4 MG/ML IJ SOLN
4.0000 mg | Freq: Once | INTRAMUSCULAR | Status: AC
  Administered 2012-04-22: 4 mg via INTRAVENOUS
  Filled 2012-04-22: qty 1

## 2012-04-22 MED ORDER — MORPHINE SULFATE ER 15 MG PO TBCR
15.0000 mg | EXTENDED_RELEASE_TABLET | Freq: Two times a day (BID) | ORAL | Status: DC
  Administered 2012-04-22 – 2012-04-24 (×5): 15 mg via ORAL
  Filled 2012-04-22 (×6): qty 1

## 2012-04-22 MED ORDER — SODIUM CHLORIDE 0.9 % IJ SOLN
3.0000 mL | Freq: Two times a day (BID) | INTRAMUSCULAR | Status: DC
  Administered 2012-04-22 – 2012-04-24 (×3): 3 mL via INTRAVENOUS

## 2012-04-22 MED ORDER — ONDANSETRON HCL 4 MG/2ML IJ SOLN
4.0000 mg | Freq: Four times a day (QID) | INTRAMUSCULAR | Status: DC | PRN
  Administered 2012-04-22 – 2012-04-23 (×3): 4 mg via INTRAVENOUS
  Filled 2012-04-22 (×2): qty 2

## 2012-04-22 MED ORDER — MECLIZINE HCL 25 MG PO TABS
25.0000 mg | ORAL_TABLET | Freq: Three times a day (TID) | ORAL | Status: DC
  Administered 2012-04-22 – 2012-04-24 (×6): 25 mg via ORAL
  Filled 2012-04-22 (×8): qty 1

## 2012-04-22 MED ORDER — CONJ ESTROG-MEDROXYPROGEST ACE 0.3-1.5 MG PO TABS: 1.0000 | ORAL_TABLET | Freq: Every day | ORAL | Status: DC

## 2012-04-22 MED ORDER — SODIUM CHLORIDE 0.9 % IV SOLN
INTRAVENOUS | Status: DC
  Administered 2012-04-22: 125 mL/h via INTRAVENOUS
  Administered 2012-04-22 – 2012-04-23 (×2): via INTRAVENOUS

## 2012-04-22 MED ORDER — KETOCONAZOLE 2 % EX CREA
1.0000 "application " | TOPICAL_CREAM | Freq: Every day | CUTANEOUS | Status: DC
  Administered 2012-04-22 – 2012-04-24 (×3): 1 via TOPICAL
  Filled 2012-04-22: qty 15

## 2012-04-22 MED ORDER — GADOBENATE DIMEGLUMINE 529 MG/ML IV SOLN
20.0000 mL | Freq: Once | INTRAVENOUS | Status: AC | PRN
  Administered 2012-04-22: 20 mL via INTRAVENOUS

## 2012-04-22 MED ORDER — GABAPENTIN 300 MG PO CAPS
300.0000 mg | ORAL_CAPSULE | Freq: Four times a day (QID) | ORAL | Status: DC
  Administered 2012-04-22 – 2012-04-24 (×8): 300 mg via ORAL
  Filled 2012-04-22 (×12): qty 1

## 2012-04-22 MED ORDER — DIAZEPAM 2 MG PO TABS
2.0000 mg | ORAL_TABLET | Freq: Once | ORAL | Status: AC
  Administered 2012-04-22: 2 mg via ORAL
  Filled 2012-04-22: qty 1

## 2012-04-22 MED ORDER — ATORVASTATIN CALCIUM 80 MG PO TABS
80.0000 mg | ORAL_TABLET | Freq: Every day | ORAL | Status: DC
  Administered 2012-04-22 – 2012-04-23 (×2): 80 mg via ORAL
  Filled 2012-04-22 (×3): qty 1

## 2012-04-22 MED ORDER — SODIUM CHLORIDE 0.9 % IV SOLN
Freq: Once | INTRAVENOUS | Status: DC
Start: 2012-04-22 — End: 2012-04-24

## 2012-04-22 MED ORDER — INFLUENZA VIRUS VACC SPLIT PF IM SUSP
0.5000 mL | INTRAMUSCULAR | Status: AC
  Administered 2012-04-23: 0.5 mL via INTRAMUSCULAR
  Filled 2012-04-22: qty 0.5

## 2012-04-22 NOTE — Progress Notes (Deleted)
Spoke with Ward Givens, regarding need for PIV in RUE. Per Thayer Ohm, "leave out and they can figure it out in the morning."
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
## 2012-04-22 NOTE — ED Notes (Signed)
Pt given cup of ice for soda

## 2012-04-22 NOTE — ED Notes (Signed)
Bk-fast ordered

## 2012-04-22 NOTE — ED Notes (Signed)
Se paper chart

## 2012-04-22 NOTE — ED Notes (Signed)
MD at bedside. Pt states IV in right AC is hurting, slight swelling noted.  IV d/c'd with cath intact

## 2012-04-22 NOTE — ED Provider Notes (Signed)
Medical screening examination/treatment/procedure(s) were performed by non-physician practitioner and as supervising physician I was immediately available for consultation/collaboration.   Loren Racer, MD 04/22/12 1119
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
## 2012-04-22 NOTE — Progress Notes (Signed)
  Echocardiogram 2D Echocardiogram has been performed.  [name] [name] 04/22/2012, 4:38 PM 

## 2012-04-23 DIAGNOSIS — R42 Dizziness and giddiness: Secondary | ICD-10-CM

## 2012-04-23 LAB — CBC
Hemoglobin: 10.3 g/dL — ABNORMAL LOW (ref 12.0–15.0)
MCH: 27.2 pg (ref 26.0–34.0)
MCHC: 31.9 g/dL (ref 30.0–36.0)
MCV: 85.4 fL (ref 78.0–100.0)

## 2012-04-23 LAB — COMPREHENSIVE METABOLIC PANEL
ALT: 10 U/L (ref 0–35)
AST: 19 U/L (ref 0–37)
Albumin: 3 g/dL — ABNORMAL LOW (ref 3.5–5.2)
CO2: 31 mEq/L (ref 19–32)
Calcium: 8.3 mg/dL — ABNORMAL LOW (ref 8.4–10.5)
Chloride: 106 mEq/L (ref 96–112)
GFR calc non Af Amer: 67 mL/min — ABNORMAL LOW (ref >90)
Sodium: 143 mEq/L (ref 135–145)
Total Bilirubin: 0.3 mg/dL (ref 0.3–1.2)

## 2012-04-23 MED ORDER — MORPHINE SULFATE 15 MG PO TABS
15.0000 mg | ORAL_TABLET | Freq: Every day | ORAL | Status: DC | PRN
  Administered 2012-04-23: 15 mg via ORAL
  Filled 2012-04-23 (×2): qty 1

## 2012-04-23 MED ORDER — POTASSIUM CHLORIDE CRYS ER 20 MEQ PO TBCR
40.0000 meq | EXTENDED_RELEASE_TABLET | Freq: Once | ORAL | Status: AC
  Administered 2012-04-23: 40 meq via ORAL
  Filled 2012-04-23: qty 2

## 2012-04-23 MED ORDER — PROMETHAZINE HCL 25 MG PO TABS
25.0000 mg | ORAL_TABLET | Freq: Four times a day (QID) | ORAL | Status: DC | PRN
  Administered 2012-04-23 – 2012-04-24 (×2): 25 mg via ORAL
  Filled 2012-04-23 (×2): qty 1
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
## 2012-04-24 MED ORDER — MORPHINE SULFATE 15 MG PO TABS: 15.0000 mg | ORAL_TABLET | Freq: Every day | ORAL | Status: DC

## 2012-04-24 MED ORDER — MECLIZINE HCL 25 MG PO TABS: 25.0000 mg | ORAL_TABLET | Freq: Three times a day (TID) | ORAL | Status: DC | PRN

## 2012-04-24 MED ORDER — MORPHINE SULFATE ER 15 MG PO TBCR: 15.0000 mg | EXTENDED_RELEASE_TABLET | Freq: Three times a day (TID) | ORAL | Status: DC

## 2012-04-24 MED ORDER — DICLOFENAC SODIUM 1 % TD GEL: 1.0000 "application " | Freq: Four times a day (QID) | TRANSDERMAL | Status: DC | PRN

## 2012-04-24 MED ORDER — PROMETHAZINE HCL 25 MG PO TABS: 25.0000 mg | ORAL_TABLET | Freq: Four times a day (QID) | ORAL | Status: DC | PRN

## 2012-04-24 NOTE — Progress Notes (Signed)
Reviewed discharge instructions with patient and family, they stated their understanding.  Home wheelchair delivered by [facility].  Patient discharged via wheelchair home with family.  Colman Cater

## 2012-04-24 NOTE — Progress Notes (Signed)
Late Entry secondary to missed G code:  05/08/12 1601  PT G-Codes **NOT FOR INPATIENT CLASS**  Functional Assessment Tool Used (Clinical judgment)  Functional Limitation Changing and maintaining body position  Changing and Maintaining Body Position Current Status ([id number]) CJ  Changing and Maintaining Body Position Goal Status [phone]) [facility]   Daequan Kozma [place],PT Acute Rehabilitation [phone] (pager)
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
## 2012-05-07 ENCOUNTER — Encounter
Payer: Medicare Other | Attending: Physical Medicine and Rehabilitation | Admitting: Physical Medicine and Rehabilitation

## 2012-05-07 ENCOUNTER — Encounter: Payer: Self-pay | Admitting: Physical Medicine and Rehabilitation

## 2012-05-07 VITALS — BP 154/90 | HR 89 | Resp 14 | Ht 66.0 in | Wt 199.8 lb

## 2012-05-07 DIAGNOSIS — M545 Low back pain, unspecified: Secondary | ICD-10-CM | POA: Insufficient documentation

## 2012-05-07 DIAGNOSIS — K5090 Crohn's disease, unspecified, without complications: Secondary | ICD-10-CM | POA: Insufficient documentation

## 2012-05-07 DIAGNOSIS — M961 Postlaminectomy syndrome, not elsewhere classified: Secondary | ICD-10-CM

## 2012-05-07 DIAGNOSIS — M533 Sacrococcygeal disorders, not elsewhere classified: Secondary | ICD-10-CM | POA: Insufficient documentation

## 2012-05-07 DIAGNOSIS — M79609 Pain in unspecified limb: Secondary | ICD-10-CM | POA: Insufficient documentation

## 2012-05-07 DIAGNOSIS — Z981 Arthrodesis status: Secondary | ICD-10-CM | POA: Insufficient documentation

## 2012-05-07 DIAGNOSIS — M542 Cervicalgia: Secondary | ICD-10-CM | POA: Insufficient documentation

## 2012-05-07 DIAGNOSIS — G8929 Other chronic pain: Secondary | ICD-10-CM | POA: Insufficient documentation

## 2012-05-07 MED ORDER — MORPHINE SULFATE ER 15 MG PO TBCR: 15.0000 mg | EXTENDED_RELEASE_TABLET | Freq: Three times a day (TID) | ORAL | Status: DC

## 2012-05-07 MED ORDER — MORPHINE SULFATE 15 MG PO TABS: 15.0000 mg | ORAL_TABLET | Freq: Every day | ORAL | Status: DC
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
## 2012-05-07 NOTE — Patient Instructions (Signed)
Continue with applying the voltaren gel, you can also apply heat to your neck .

## 2012-06-18 ENCOUNTER — Encounter
Payer: Medicare Other | Attending: Physical Medicine and Rehabilitation | Admitting: Physical Medicine and Rehabilitation

## 2012-06-18 ENCOUNTER — Encounter: Payer: Self-pay | Admitting: Physical Medicine and Rehabilitation

## 2012-06-18 VITALS — BP 142/74 | HR 95 | Resp 14 | Ht 66.0 in | Wt 205.6 lb

## 2012-06-18 DIAGNOSIS — I10 Essential (primary) hypertension: Secondary | ICD-10-CM | POA: Insufficient documentation

## 2012-06-18 DIAGNOSIS — M542 Cervicalgia: Secondary | ICD-10-CM | POA: Insufficient documentation

## 2012-06-18 DIAGNOSIS — M533 Sacrococcygeal disorders, not elsewhere classified: Secondary | ICD-10-CM | POA: Insufficient documentation

## 2012-06-18 DIAGNOSIS — M545 Low back pain, unspecified: Secondary | ICD-10-CM | POA: Insufficient documentation

## 2012-06-18 DIAGNOSIS — K5090 Crohn's disease, unspecified, without complications: Secondary | ICD-10-CM | POA: Insufficient documentation

## 2012-06-18 DIAGNOSIS — M961 Postlaminectomy syndrome, not elsewhere classified: Secondary | ICD-10-CM

## 2012-06-18 DIAGNOSIS — G8929 Other chronic pain: Secondary | ICD-10-CM | POA: Insufficient documentation

## 2012-06-18 DIAGNOSIS — Z981 Arthrodesis status: Secondary | ICD-10-CM | POA: Insufficient documentation

## 2012-06-18 MED ORDER — MORPHINE SULFATE 15 MG PO TABS: 15.0000 mg | ORAL_TABLET | Freq: Every day | ORAL | Status: DC

## 2012-06-18 MED ORDER — MORPHINE SULFATE ER 15 MG PO TBCR: 15.0000 mg | EXTENDED_RELEASE_TABLET | Freq: Three times a day (TID) | ORAL | Status: DC
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
## 2012-06-18 NOTE — Patient Instructions (Signed)
Continue with your exercises program as pain permits.

## 2012-06-27 ENCOUNTER — Ambulatory Visit: Payer: Medicare Other | Attending: Orthopaedic Surgery | Admitting: Physical Therapy

## 2012-06-27 DIAGNOSIS — IMO0001 Reserved for inherently not codable concepts without codable children: Secondary | ICD-10-CM | POA: Insufficient documentation

## 2012-06-27 DIAGNOSIS — M25559 Pain in unspecified hip: Secondary | ICD-10-CM | POA: Insufficient documentation

## 2012-06-27 DIAGNOSIS — M545 Low back pain, unspecified: Secondary | ICD-10-CM | POA: Insufficient documentation

## 2012-07-04 ENCOUNTER — Ambulatory Visit: Payer: Medicare Other | Admitting: Rehabilitation

## 2012-07-09 ENCOUNTER — Ambulatory Visit: Payer: Medicare Other | Admitting: Rehabilitation

## 2012-07-11 ENCOUNTER — Ambulatory Visit: Payer: Medicare Other

## 2012-07-12 ENCOUNTER — Encounter: Payer: Medicare Other | Admitting: Rehabilitation

## 2012-07-16 ENCOUNTER — Ambulatory Visit: Payer: Medicare Other | Admitting: Physical Therapy

## 2012-07-16 ENCOUNTER — Other Ambulatory Visit: Payer: Self-pay | Admitting: Physical Medicine and Rehabilitation

## 2012-07-16 NOTE — Telephone Encounter (Signed)
Gabapentin refill request.  Please advise 

## 2012-07-18 ENCOUNTER — Encounter: Payer: Medicare Other | Admitting: Physical Medicine and Rehabilitation

## 2012-07-19 ENCOUNTER — Encounter
Payer: Medicare Other | Attending: Physical Medicine and Rehabilitation | Admitting: Physical Medicine and Rehabilitation

## 2012-07-19 ENCOUNTER — Ambulatory Visit: Payer: Medicare Other | Admitting: Rehabilitation

## 2012-07-19 ENCOUNTER — Encounter: Payer: Self-pay | Admitting: Physical Medicine and Rehabilitation

## 2012-07-19 VITALS — BP 128/62 | HR 77 | Resp 14 | Ht 66.0 in | Wt 208.6 lb

## 2012-07-19 DIAGNOSIS — Z981 Arthrodesis status: Secondary | ICD-10-CM | POA: Insufficient documentation

## 2012-07-19 DIAGNOSIS — M542 Cervicalgia: Secondary | ICD-10-CM | POA: Insufficient documentation

## 2012-07-19 DIAGNOSIS — M961 Postlaminectomy syndrome, not elsewhere classified: Secondary | ICD-10-CM

## 2012-07-19 DIAGNOSIS — K5090 Crohn's disease, unspecified, without complications: Secondary | ICD-10-CM | POA: Insufficient documentation

## 2012-07-19 DIAGNOSIS — M545 Low back pain, unspecified: Secondary | ICD-10-CM | POA: Insufficient documentation

## 2012-07-19 DIAGNOSIS — M533 Sacrococcygeal disorders, not elsewhere classified: Secondary | ICD-10-CM | POA: Insufficient documentation

## 2012-07-19 DIAGNOSIS — G8929 Other chronic pain: Secondary | ICD-10-CM | POA: Insufficient documentation

## 2012-07-19 MED ORDER — MORPHINE SULFATE 15 MG PO TABS: 15.0000 mg | ORAL_TABLET | Freq: Every day | ORAL | Status: DC

## 2012-07-19 MED ORDER — MORPHINE SULFATE ER 15 MG PO TBCR: 15.0000 mg | EXTENDED_RELEASE_TABLET | Freq: Three times a day (TID) | ORAL | Status: DC
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
## 2012-07-19 NOTE — Patient Instructions (Signed)
Continue with PT

## 2012-07-25 ENCOUNTER — Ambulatory Visit: Payer: Medicare Other | Attending: Orthopaedic Surgery | Admitting: Physical Therapy

## 2012-07-25 DIAGNOSIS — M25559 Pain in unspecified hip: Secondary | ICD-10-CM | POA: Insufficient documentation

## 2012-07-25 DIAGNOSIS — M545 Low back pain, unspecified: Secondary | ICD-10-CM | POA: Insufficient documentation

## 2012-07-25 DIAGNOSIS — IMO0001 Reserved for inherently not codable concepts without codable children: Secondary | ICD-10-CM | POA: Insufficient documentation

## 2012-08-06 ENCOUNTER — Telehealth: Payer: Self-pay

## 2012-08-06 MED ORDER — GABAPENTIN 300 MG PO CAPS: 300.0000 mg | ORAL_CAPSULE | Freq: Three times a day (TID) | ORAL | Status: DC

## 2012-08-06 NOTE — Telephone Encounter (Signed)
Patient called with problems regarding her gabapentin.  I called the pharmacy and they say she just needed refills.  This was done electronically.   Patient aware.

## 2012-08-13 ENCOUNTER — Ambulatory Visit: Payer: Medicare Other | Admitting: Physical Therapy

## 2012-08-16 ENCOUNTER — Encounter: Payer: Self-pay | Admitting: Physical Medicine and Rehabilitation

## 2012-08-16 ENCOUNTER — Encounter
Payer: Medicare Other | Attending: Physical Medicine and Rehabilitation | Admitting: Physical Medicine and Rehabilitation

## 2012-08-16 VITALS — BP 96/69 | HR 66 | Resp 14 | Ht 66.0 in | Wt 201.0 lb

## 2012-08-16 DIAGNOSIS — M533 Sacrococcygeal disorders, not elsewhere classified: Secondary | ICD-10-CM | POA: Insufficient documentation

## 2012-08-16 DIAGNOSIS — M542 Cervicalgia: Secondary | ICD-10-CM | POA: Insufficient documentation

## 2012-08-16 DIAGNOSIS — Z981 Arthrodesis status: Secondary | ICD-10-CM | POA: Insufficient documentation

## 2012-08-16 DIAGNOSIS — G8929 Other chronic pain: Secondary | ICD-10-CM | POA: Insufficient documentation

## 2012-08-16 DIAGNOSIS — M545 Low back pain, unspecified: Secondary | ICD-10-CM | POA: Insufficient documentation

## 2012-08-16 DIAGNOSIS — K5090 Crohn's disease, unspecified, without complications: Secondary | ICD-10-CM | POA: Insufficient documentation

## 2012-08-16 MED ORDER — DICLOFENAC SODIUM 1 % TD GEL: 2.0000 g | Freq: Four times a day (QID) | TRANSDERMAL | Status: DC | PRN

## 2012-08-16 MED ORDER — MORPHINE SULFATE ER 15 MG PO TBCR: 15.0000 mg | EXTENDED_RELEASE_TABLET | Freq: Three times a day (TID) | ORAL | Status: DC

## 2012-08-16 MED ORDER — MORPHINE SULFATE 15 MG PO TABS: 15.0000 mg | ORAL_TABLET | Freq: Every day | ORAL | Status: DC
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
## 2012-08-16 NOTE — Patient Instructions (Signed)
Continue with the exercises you learned from PT. Look into whether your insurance supports the [employer] program, where they would pay for a Y membership, so you could do aquatic exercises.

## 2012-08-20 ENCOUNTER — Telehealth: Payer: Self-pay

## 2012-08-20 ENCOUNTER — Telehealth: Payer: Self-pay | Admitting: Physical Medicine and Rehabilitation

## 2012-08-20 NOTE — Telephone Encounter (Signed)
Prior auth requested, patient aware.

## 2012-08-20 NOTE — Telephone Encounter (Signed)
Patient is taking voltaren and Clydie Braun told her to call when she was running out.  Zulema asked if you can call [employer] prescription renewal line at [phone] to approve first.

## 2012-08-20 NOTE — Telephone Encounter (Signed)
Patient says she needs to have votaren gel called into [facility] pharmacy clinical renewal [phone].

## 2012-08-20 NOTE — Telephone Encounter (Signed)
Prior [name] [name] was requested and patient aware.

## 2012-08-22 ENCOUNTER — Telehealth: Payer: Self-pay

## 2012-08-22 NOTE — Telephone Encounter (Signed)
Could try Arnica cream OTC, or she could find out whether her insurance is paying for pennsaid drops

## 2012-08-22 NOTE — Telephone Encounter (Signed)
Voltaren gel denied.  No alternatives given.  Please advise something else.

## 2012-08-23 DIAGNOSIS — K5900 Constipation, unspecified: Secondary | ICD-10-CM | POA: Insufficient documentation

## 2012-08-23 DIAGNOSIS — R109 Unspecified abdominal pain: Secondary | ICD-10-CM | POA: Insufficient documentation

## 2012-08-23 NOTE — Telephone Encounter (Signed)
Tried to contact patient regarding voltaren gel denial.  Per [name] [name] patient can try arnica cream or contact her insurance to see if they will pay for pennsaid.

## 2012-08-27 ENCOUNTER — Telehealth: Payer: Self-pay | Admitting: Physical Medicine and Rehabilitation

## 2012-08-27 NOTE — Telephone Encounter (Signed)
Spoke with patient.  Informed patient to try arnica and/or call her insurance to see what they will cover.

## 2012-08-27 NOTE — Telephone Encounter (Signed)
Patient returned call she can be reached at [phone]

## 2012-09-12 ENCOUNTER — Encounter: Payer: Self-pay | Admitting: Physical Medicine and Rehabilitation

## 2012-09-12 ENCOUNTER — Encounter
Payer: Medicare Other | Attending: Physical Medicine and Rehabilitation | Admitting: Physical Medicine and Rehabilitation

## 2012-09-12 VITALS — BP 129/73 | HR 70 | Resp 14 | Ht 66.0 in | Wt 205.0 lb

## 2012-09-12 DIAGNOSIS — I10 Essential (primary) hypertension: Secondary | ICD-10-CM | POA: Insufficient documentation

## 2012-09-12 DIAGNOSIS — K5090 Crohn's disease, unspecified, without complications: Secondary | ICD-10-CM | POA: Insufficient documentation

## 2012-09-12 DIAGNOSIS — M961 Postlaminectomy syndrome, not elsewhere classified: Secondary | ICD-10-CM

## 2012-09-12 DIAGNOSIS — M545 Low back pain, unspecified: Secondary | ICD-10-CM | POA: Insufficient documentation

## 2012-09-12 DIAGNOSIS — G8929 Other chronic pain: Secondary | ICD-10-CM | POA: Insufficient documentation

## 2012-09-12 DIAGNOSIS — M533 Sacrococcygeal disorders, not elsewhere classified: Secondary | ICD-10-CM | POA: Insufficient documentation

## 2012-09-12 DIAGNOSIS — Z981 Arthrodesis status: Secondary | ICD-10-CM | POA: Insufficient documentation

## 2012-09-12 DIAGNOSIS — M542 Cervicalgia: Secondary | ICD-10-CM | POA: Insufficient documentation

## 2012-09-12 MED ORDER — MORPHINE SULFATE 15 MG PO TABS: 15.0000 mg | ORAL_TABLET | Freq: Every day | ORAL | Status: DC

## 2012-09-12 MED ORDER — MORPHINE SULFATE ER 15 MG PO TBCR: 15.0000 mg | EXTENDED_RELEASE_TABLET | Freq: Three times a day (TID) | ORAL | Status: DC

## 2012-09-12 NOTE — Patient Instructions (Signed)
Continue with your exercises as pain allows.
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
## 2012-10-11 ENCOUNTER — Encounter
Payer: Medicare Other | Attending: Physical Medicine and Rehabilitation | Admitting: Physical Medicine and Rehabilitation

## 2012-10-11 ENCOUNTER — Encounter: Payer: Self-pay | Admitting: Physical Medicine and Rehabilitation

## 2012-10-11 VITALS — BP 163/73 | HR 83 | Resp 14 | Ht 66.0 in | Wt 204.0 lb

## 2012-10-11 DIAGNOSIS — M25519 Pain in unspecified shoulder: Secondary | ICD-10-CM

## 2012-10-11 DIAGNOSIS — M961 Postlaminectomy syndrome, not elsewhere classified: Secondary | ICD-10-CM | POA: Insufficient documentation

## 2012-10-11 DIAGNOSIS — M25511 Pain in right shoulder: Secondary | ICD-10-CM

## 2012-10-11 MED ORDER — CYCLOBENZAPRINE HCL 10 MG PO TABS: 10.0000 mg | ORAL_TABLET | Freq: Three times a day (TID) | ORAL | Status: DC | PRN

## 2012-10-11 MED ORDER — MORPHINE SULFATE ER 15 MG PO TBCR: 15.0000 mg | EXTENDED_RELEASE_TABLET | Freq: Three times a day (TID) | ORAL | Status: DC

## 2012-10-11 MED ORDER — MORPHINE SULFATE 15 MG PO TABS: 15.0000 mg | ORAL_TABLET | Freq: Every day | ORAL | Status: DC

## 2012-10-11 NOTE — Patient Instructions (Signed)
Continue with using the Voltaren gel and a heating pad
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
## 2012-10-19 DIAGNOSIS — R002 Palpitations: Secondary | ICD-10-CM | POA: Insufficient documentation

## 2012-11-05 ENCOUNTER — Encounter: Payer: Self-pay | Admitting: Physical Medicine and Rehabilitation

## 2012-11-05 ENCOUNTER — Encounter
Payer: Medicare Other | Attending: Physical Medicine and Rehabilitation | Admitting: Physical Medicine and Rehabilitation

## 2012-11-05 VITALS — BP 125/74 | HR 71 | Resp 14 | Ht 66.0 in | Wt 204.0 lb

## 2012-11-05 DIAGNOSIS — M533 Sacrococcygeal disorders, not elsewhere classified: Secondary | ICD-10-CM | POA: Insufficient documentation

## 2012-11-05 DIAGNOSIS — M79609 Pain in unspecified limb: Secondary | ICD-10-CM | POA: Insufficient documentation

## 2012-11-05 DIAGNOSIS — K5090 Crohn's disease, unspecified, without complications: Secondary | ICD-10-CM | POA: Insufficient documentation

## 2012-11-05 DIAGNOSIS — M545 Low back pain, unspecified: Secondary | ICD-10-CM | POA: Insufficient documentation

## 2012-11-05 DIAGNOSIS — G8929 Other chronic pain: Secondary | ICD-10-CM | POA: Insufficient documentation

## 2012-11-05 DIAGNOSIS — Z981 Arthrodesis status: Secondary | ICD-10-CM | POA: Insufficient documentation

## 2012-11-05 DIAGNOSIS — M25519 Pain in unspecified shoulder: Secondary | ICD-10-CM | POA: Insufficient documentation

## 2012-11-05 DIAGNOSIS — M961 Postlaminectomy syndrome, not elsewhere classified: Secondary | ICD-10-CM

## 2012-11-05 DIAGNOSIS — M542 Cervicalgia: Secondary | ICD-10-CM | POA: Insufficient documentation

## 2012-11-05 MED ORDER — MORPHINE SULFATE 15 MG PO TABS: 15.0000 mg | ORAL_TABLET | Freq: Every day | ORAL | Status: DC

## 2012-11-05 MED ORDER — MORPHINE SULFATE ER 15 MG PO TBCR: 15.0000 mg | EXTENDED_RELEASE_TABLET | Freq: Three times a day (TID) | ORAL | Status: DC

## 2012-11-05 NOTE — Patient Instructions (Addendum)
Stay as active as tolerated.Continue with your exercises as pain permits.
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
## 2012-12-03 ENCOUNTER — Encounter: Payer: Self-pay | Admitting: Physical Medicine and Rehabilitation

## 2012-12-03 ENCOUNTER — Encounter
Payer: Medicare Other | Attending: Physical Medicine and Rehabilitation | Admitting: Physical Medicine and Rehabilitation

## 2012-12-03 VITALS — BP 141/81 | HR 74 | Resp 16 | Ht 66.0 in | Wt 204.0 lb

## 2012-12-03 DIAGNOSIS — M25519 Pain in unspecified shoulder: Secondary | ICD-10-CM | POA: Insufficient documentation

## 2012-12-03 DIAGNOSIS — M542 Cervicalgia: Secondary | ICD-10-CM | POA: Insufficient documentation

## 2012-12-03 DIAGNOSIS — K5090 Crohn's disease, unspecified, without complications: Secondary | ICD-10-CM | POA: Insufficient documentation

## 2012-12-03 DIAGNOSIS — Z981 Arthrodesis status: Secondary | ICD-10-CM | POA: Insufficient documentation

## 2012-12-03 DIAGNOSIS — M545 Low back pain, unspecified: Secondary | ICD-10-CM | POA: Insufficient documentation

## 2012-12-03 DIAGNOSIS — G8929 Other chronic pain: Secondary | ICD-10-CM | POA: Insufficient documentation

## 2012-12-03 DIAGNOSIS — M533 Sacrococcygeal disorders, not elsewhere classified: Secondary | ICD-10-CM | POA: Insufficient documentation

## 2012-12-03 DIAGNOSIS — I10 Essential (primary) hypertension: Secondary | ICD-10-CM | POA: Insufficient documentation

## 2012-12-03 DIAGNOSIS — M961 Postlaminectomy syndrome, not elsewhere classified: Secondary | ICD-10-CM

## 2012-12-03 MED ORDER — MORPHINE SULFATE 15 MG PO TABS: 15.0000 mg | ORAL_TABLET | Freq: Every day | ORAL | Status: DC

## 2012-12-03 MED ORDER — MORPHINE SULFATE ER 15 MG PO TBCR: 15.0000 mg | EXTENDED_RELEASE_TABLET | Freq: Three times a day (TID) | ORAL | Status: DC

## 2012-12-03 NOTE — Patient Instructions (Signed)
Stay as active as tolerated. 
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
## 2012-12-31 ENCOUNTER — Encounter: Payer: Self-pay | Admitting: Physical Medicine and Rehabilitation

## 2012-12-31 ENCOUNTER — Encounter
Payer: Medicare Other | Attending: Physical Medicine and Rehabilitation | Admitting: Physical Medicine and Rehabilitation

## 2012-12-31 VITALS — BP 122/75 | HR 70 | Resp 16 | Ht 66.0 in | Wt 202.0 lb

## 2012-12-31 DIAGNOSIS — Z5181 Encounter for therapeutic drug level monitoring: Secondary | ICD-10-CM

## 2012-12-31 DIAGNOSIS — Z981 Arthrodesis status: Secondary | ICD-10-CM | POA: Insufficient documentation

## 2012-12-31 DIAGNOSIS — M545 Low back pain, unspecified: Secondary | ICD-10-CM | POA: Insufficient documentation

## 2012-12-31 DIAGNOSIS — M533 Sacrococcygeal disorders, not elsewhere classified: Secondary | ICD-10-CM | POA: Insufficient documentation

## 2012-12-31 DIAGNOSIS — G8929 Other chronic pain: Secondary | ICD-10-CM | POA: Insufficient documentation

## 2012-12-31 DIAGNOSIS — Z79899 Other long term (current) drug therapy: Secondary | ICD-10-CM

## 2012-12-31 DIAGNOSIS — M62838 Other muscle spasm: Secondary | ICD-10-CM | POA: Insufficient documentation

## 2012-12-31 DIAGNOSIS — K5090 Crohn's disease, unspecified, without complications: Secondary | ICD-10-CM | POA: Insufficient documentation

## 2012-12-31 DIAGNOSIS — M542 Cervicalgia: Secondary | ICD-10-CM | POA: Insufficient documentation

## 2012-12-31 DIAGNOSIS — M25519 Pain in unspecified shoulder: Secondary | ICD-10-CM | POA: Insufficient documentation

## 2012-12-31 DIAGNOSIS — M961 Postlaminectomy syndrome, not elsewhere classified: Secondary | ICD-10-CM

## 2012-12-31 MED ORDER — MORPHINE SULFATE 15 MG PO TABS: 15.0000 mg | ORAL_TABLET | Freq: Every day | ORAL | Status: DC

## 2012-12-31 MED ORDER — MORPHINE SULFATE ER 15 MG PO TBCR: 15.0000 mg | EXTENDED_RELEASE_TABLET | Freq: Three times a day (TID) | ORAL | Status: DC

## 2012-12-31 NOTE — Addendum Note (Signed)
Addended by: Judd Gaudier on: 12/31/2012 12:09 PM   Modules accepted: Orders

## 2012-12-31 NOTE — Patient Instructions (Signed)
Continue with your aquatic exercises 
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
## 2013-01-30 ENCOUNTER — Encounter: Payer: Self-pay | Admitting: Physical Medicine and Rehabilitation

## 2013-01-30 ENCOUNTER — Encounter
Payer: Medicare Other | Attending: Physical Medicine and Rehabilitation | Admitting: Physical Medicine and Rehabilitation

## 2013-01-30 VITALS — BP 137/76 | HR 72 | Resp 14 | Ht 66.0 in | Wt 204.0 lb

## 2013-01-30 DIAGNOSIS — M545 Low back pain, unspecified: Secondary | ICD-10-CM | POA: Insufficient documentation

## 2013-01-30 DIAGNOSIS — M25519 Pain in unspecified shoulder: Secondary | ICD-10-CM | POA: Insufficient documentation

## 2013-01-30 DIAGNOSIS — Z79899 Other long term (current) drug therapy: Secondary | ICD-10-CM | POA: Insufficient documentation

## 2013-01-30 DIAGNOSIS — M961 Postlaminectomy syndrome, not elsewhere classified: Secondary | ICD-10-CM

## 2013-01-30 DIAGNOSIS — K5090 Crohn's disease, unspecified, without complications: Secondary | ICD-10-CM | POA: Insufficient documentation

## 2013-01-30 DIAGNOSIS — M542 Cervicalgia: Secondary | ICD-10-CM | POA: Insufficient documentation

## 2013-01-30 DIAGNOSIS — M533 Sacrococcygeal disorders, not elsewhere classified: Secondary | ICD-10-CM | POA: Insufficient documentation

## 2013-01-30 DIAGNOSIS — Z981 Arthrodesis status: Secondary | ICD-10-CM | POA: Insufficient documentation

## 2013-01-30 MED ORDER — MORPHINE SULFATE ER 15 MG PO TBCR: 15.0000 mg | EXTENDED_RELEASE_TABLET | Freq: Three times a day (TID) | ORAL | Status: DC

## 2013-01-30 MED ORDER — MORPHINE SULFATE 15 MG PO TABS: 15.0000 mg | ORAL_TABLET | Freq: Every day | ORAL | Status: DC
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
## 2013-01-30 NOTE — Patient Instructions (Signed)
Try to stay as active as pain permits, continue with your exercising in the pool

## 2013-02-25 ENCOUNTER — Encounter: Payer: Self-pay | Admitting: Physical Medicine and Rehabilitation

## 2013-02-25 ENCOUNTER — Encounter
Payer: Medicare Other | Attending: Physical Medicine and Rehabilitation | Admitting: Physical Medicine and Rehabilitation

## 2013-02-25 VITALS — BP 149/77 | HR 80 | Resp 14 | Ht 66.0 in | Wt 202.8 lb

## 2013-02-25 DIAGNOSIS — M25519 Pain in unspecified shoulder: Secondary | ICD-10-CM | POA: Insufficient documentation

## 2013-02-25 DIAGNOSIS — G8929 Other chronic pain: Secondary | ICD-10-CM | POA: Insufficient documentation

## 2013-02-25 DIAGNOSIS — Z981 Arthrodesis status: Secondary | ICD-10-CM | POA: Insufficient documentation

## 2013-02-25 DIAGNOSIS — M533 Sacrococcygeal disorders, not elsewhere classified: Secondary | ICD-10-CM | POA: Insufficient documentation

## 2013-02-25 DIAGNOSIS — M542 Cervicalgia: Secondary | ICD-10-CM | POA: Insufficient documentation

## 2013-02-25 DIAGNOSIS — M545 Low back pain, unspecified: Secondary | ICD-10-CM | POA: Insufficient documentation

## 2013-02-25 DIAGNOSIS — K5090 Crohn's disease, unspecified, without complications: Secondary | ICD-10-CM | POA: Insufficient documentation

## 2013-02-25 DIAGNOSIS — I10 Essential (primary) hypertension: Secondary | ICD-10-CM | POA: Insufficient documentation

## 2013-02-25 DIAGNOSIS — M961 Postlaminectomy syndrome, not elsewhere classified: Secondary | ICD-10-CM

## 2013-02-25 MED ORDER — MORPHINE SULFATE ER 15 MG PO TBCR: 15.0000 mg | EXTENDED_RELEASE_TABLET | Freq: Three times a day (TID) | ORAL | Status: DC

## 2013-02-25 MED ORDER — MORPHINE SULFATE 15 MG PO TABS: 15.0000 mg | ORAL_TABLET | Freq: Every day | ORAL | Status: DC
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
## 2013-02-25 NOTE — Patient Instructions (Signed)
Stay as active as tolerated. 

## 2013-03-13 ENCOUNTER — Telehealth: Payer: Self-pay

## 2013-03-13 NOTE — Telephone Encounter (Signed)
Patient is requesting that the RX for gabapentin be transferred to [facility] on [address] [place]. Patient said she has been without the medication for a month because of the pharmacy.

## 2013-03-14 ENCOUNTER — Telehealth: Payer: Self-pay

## 2013-03-14 ENCOUNTER — Emergency Department: Payer: Self-pay | Admitting: Emergency Medicine

## 2013-03-14 LAB — COMPREHENSIVE METABOLIC PANEL
Albumin: 4.2 g/dL (ref 3.4–5.0)
Alkaline Phosphatase: 81 U/L (ref 50–136)
Bilirubin,Total: 1.7 mg/dL — ABNORMAL HIGH (ref 0.2–1.0)
Calcium, Total: 9.4 mg/dL (ref 8.5–10.1)
Chloride: 100 mmol/L (ref 98–107)
Co2: 24 mmol/L (ref 21–32)
EGFR (Non-African Amer.): 30 — ABNORMAL LOW
Osmolality: 272 (ref 275–301)
Potassium: 2.4 mmol/L — CL (ref 3.5–5.1)
SGPT (ALT): 24 U/L (ref 12–78)
Sodium: 134 mmol/L — ABNORMAL LOW (ref 136–145)
Total Protein: 9.5 g/dL — ABNORMAL HIGH (ref 6.4–8.2)

## 2013-03-14 LAB — CLOSTRIDIUM DIFFICILE BY PCR

## 2013-03-14 LAB — URINALYSIS, COMPLETE
Bilirubin,UR: NEGATIVE
Hyaline Cast: 90
Nitrite: NEGATIVE
Ph: 5 (ref 4.5–8.0)
Protein: 100
Specific Gravity: 1.021 (ref 1.003–1.030)
Squamous Epithelial: 15
WBC UR: 19 /HPF (ref 0–5)

## 2013-03-14 LAB — CBC
HCT: 42.6 % (ref 35.0–47.0)
MCH: 28 pg (ref 26.0–34.0)
MCV: 83 fL (ref 80–100)
Platelet: 224 10*3/uL (ref 150–440)
RBC: 5.13 10*6/uL (ref 3.80–5.20)
RDW: 13.5 % (ref 11.5–14.5)

## 2013-03-14 LAB — LIPASE, BLOOD: Lipase: 103 U/L (ref 73–393)

## 2013-03-14 MED ORDER — GABAPENTIN 300 MG PO CAPS: 300.0000 mg | ORAL_CAPSULE | Freq: Three times a day (TID) | ORAL | Status: DC

## 2013-03-14 NOTE — Telephone Encounter (Signed)
Left voicemail to let patient know refill had been called in to [facility].

## 2013-03-14 NOTE — Telephone Encounter (Signed)
Gabapentin refilled and transferred to [facility] on [address] as requested by patient.

## 2013-03-17 LAB — STOOL CULTURE

## 2013-03-25 ENCOUNTER — Encounter
Payer: Medicare Other | Attending: Physical Medicine and Rehabilitation | Admitting: Physical Medicine and Rehabilitation

## 2013-03-25 ENCOUNTER — Encounter: Payer: Self-pay | Admitting: Physical Medicine and Rehabilitation

## 2013-03-25 VITALS — BP 143/72 | HR 86 | Resp 14 | Ht 66.0 in | Wt 199.0 lb

## 2013-03-25 DIAGNOSIS — M545 Low back pain, unspecified: Secondary | ICD-10-CM | POA: Insufficient documentation

## 2013-03-25 DIAGNOSIS — M533 Sacrococcygeal disorders, not elsewhere classified: Secondary | ICD-10-CM | POA: Insufficient documentation

## 2013-03-25 DIAGNOSIS — M961 Postlaminectomy syndrome, not elsewhere classified: Secondary | ICD-10-CM

## 2013-03-25 DIAGNOSIS — I10 Essential (primary) hypertension: Secondary | ICD-10-CM | POA: Insufficient documentation

## 2013-03-25 DIAGNOSIS — Z981 Arthrodesis status: Secondary | ICD-10-CM | POA: Insufficient documentation

## 2013-03-25 DIAGNOSIS — G8929 Other chronic pain: Secondary | ICD-10-CM | POA: Insufficient documentation

## 2013-03-25 DIAGNOSIS — M25519 Pain in unspecified shoulder: Secondary | ICD-10-CM | POA: Insufficient documentation

## 2013-03-25 DIAGNOSIS — K5090 Crohn's disease, unspecified, without complications: Secondary | ICD-10-CM | POA: Insufficient documentation

## 2013-03-25 DIAGNOSIS — M542 Cervicalgia: Secondary | ICD-10-CM | POA: Insufficient documentation

## 2013-03-25 MED ORDER — MORPHINE SULFATE ER 15 MG PO TBCR: 15.0000 mg | EXTENDED_RELEASE_TABLET | Freq: Three times a day (TID) | ORAL | Status: DC

## 2013-03-25 MED ORDER — MORPHINE SULFATE 15 MG PO TABS: 15.0000 mg | ORAL_TABLET | Freq: Every day | ORAL | Status: DC

## 2013-03-25 NOTE — Patient Instructions (Signed)
Continue with staying as active as tolerated 
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
## 2013-03-26 DIAGNOSIS — A045 Campylobacter enteritis: Secondary | ICD-10-CM | POA: Insufficient documentation

## 2013-04-24 ENCOUNTER — Encounter
Payer: Medicare Other | Attending: Physical Medicine and Rehabilitation | Admitting: Physical Medicine and Rehabilitation

## 2013-04-24 ENCOUNTER — Encounter: Payer: Self-pay | Admitting: Physical Medicine and Rehabilitation

## 2013-04-24 VITALS — BP 132/71 | HR 74 | Resp 16 | Ht 66.0 in | Wt 198.0 lb

## 2013-04-24 DIAGNOSIS — K5090 Crohn's disease, unspecified, without complications: Secondary | ICD-10-CM | POA: Insufficient documentation

## 2013-04-24 DIAGNOSIS — M545 Low back pain, unspecified: Secondary | ICD-10-CM | POA: Insufficient documentation

## 2013-04-24 DIAGNOSIS — G894 Chronic pain syndrome: Secondary | ICD-10-CM

## 2013-04-24 DIAGNOSIS — Z79899 Other long term (current) drug therapy: Secondary | ICD-10-CM | POA: Insufficient documentation

## 2013-04-24 DIAGNOSIS — Z981 Arthrodesis status: Secondary | ICD-10-CM | POA: Insufficient documentation

## 2013-04-24 DIAGNOSIS — M961 Postlaminectomy syndrome, not elsewhere classified: Secondary | ICD-10-CM

## 2013-04-24 DIAGNOSIS — M533 Sacrococcygeal disorders, not elsewhere classified: Secondary | ICD-10-CM | POA: Insufficient documentation

## 2013-04-24 DIAGNOSIS — M25519 Pain in unspecified shoulder: Secondary | ICD-10-CM | POA: Insufficient documentation

## 2013-04-24 DIAGNOSIS — M542 Cervicalgia: Secondary | ICD-10-CM | POA: Insufficient documentation

## 2013-04-24 MED ORDER — MORPHINE SULFATE 15 MG PO TABS: 15.0000 mg | ORAL_TABLET | Freq: Every day | ORAL | Status: DC

## 2013-04-24 MED ORDER — MORPHINE SULFATE ER 15 MG PO TBCR: 15.0000 mg | EXTENDED_RELEASE_TABLET | Freq: Three times a day (TID) | ORAL | Status: DC

## 2013-04-24 MED ORDER — PROMETHAZINE HCL 25 MG PO TABS: 25.0000 mg | ORAL_TABLET | Freq: Four times a day (QID) | ORAL | Status: DC | PRN
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
## 2013-04-24 NOTE — Patient Instructions (Addendum)
Stay as active as tolerated . Try to get back into aquatic exercising

## 2013-05-15 ENCOUNTER — Encounter
Payer: Medicare Other | Attending: Physical Medicine and Rehabilitation | Admitting: Physical Medicine and Rehabilitation

## 2013-05-15 ENCOUNTER — Encounter: Payer: Self-pay | Admitting: Physical Medicine and Rehabilitation

## 2013-05-15 VITALS — BP 133/64 | HR 72 | Resp 14 | Ht 66.0 in | Wt 198.0 lb

## 2013-05-15 DIAGNOSIS — M961 Postlaminectomy syndrome, not elsewhere classified: Secondary | ICD-10-CM

## 2013-05-15 DIAGNOSIS — Z981 Arthrodesis status: Secondary | ICD-10-CM | POA: Insufficient documentation

## 2013-05-15 DIAGNOSIS — M545 Low back pain, unspecified: Secondary | ICD-10-CM | POA: Insufficient documentation

## 2013-05-15 DIAGNOSIS — M542 Cervicalgia: Secondary | ICD-10-CM | POA: Insufficient documentation

## 2013-05-15 DIAGNOSIS — M533 Sacrococcygeal disorders, not elsewhere classified: Secondary | ICD-10-CM | POA: Insufficient documentation

## 2013-05-15 DIAGNOSIS — M25519 Pain in unspecified shoulder: Secondary | ICD-10-CM | POA: Insufficient documentation

## 2013-05-15 DIAGNOSIS — Z79899 Other long term (current) drug therapy: Secondary | ICD-10-CM | POA: Insufficient documentation

## 2013-05-15 DIAGNOSIS — K5090 Crohn's disease, unspecified, without complications: Secondary | ICD-10-CM | POA: Insufficient documentation

## 2013-05-15 MED ORDER — MORPHINE SULFATE ER 15 MG PO TBCR: 15.0000 mg | EXTENDED_RELEASE_TABLET | Freq: Three times a day (TID) | ORAL | Status: DC

## 2013-05-15 MED ORDER — MORPHINE SULFATE 15 MG PO TABS: 15.0000 mg | ORAL_TABLET | Freq: Every day | ORAL | Status: DC
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
## 2013-05-15 NOTE — Patient Instructions (Addendum)
Stay as active as tolerated, continue with your aquatic exercises.

## 2013-05-23 ENCOUNTER — Ambulatory Visit: Payer: Medicare Other | Admitting: Physical Medicine and Rehabilitation

## 2013-06-06 ENCOUNTER — Other Ambulatory Visit: Payer: Self-pay | Admitting: *Deleted

## 2013-06-06 MED ORDER — MORPHINE SULFATE 15 MG PO TABS: 15.0000 mg | ORAL_TABLET | Freq: Every day | ORAL | Status: DC

## 2013-06-06 MED ORDER — MORPHINE SULFATE ER 15 MG PO TBCR: 15.0000 mg | EXTENDED_RELEASE_TABLET | Freq: Three times a day (TID) | ORAL | Status: DC

## 2013-06-06 NOTE — Telephone Encounter (Signed)
RX printed early for controlled medication for the visit with RN on 06/17/13 (to be signed by MD) 

## 2013-06-17 ENCOUNTER — Encounter: Payer: Self-pay | Admitting: *Deleted

## 2013-06-17 ENCOUNTER — Encounter: Payer: Medicare Other | Attending: Physical Medicine & Rehabilitation | Admitting: *Deleted

## 2013-06-17 VITALS — BP 130/71 | HR 78 | Resp 14

## 2013-06-17 DIAGNOSIS — IMO0002 Reserved for concepts with insufficient information to code with codable children: Secondary | ICD-10-CM

## 2013-06-17 DIAGNOSIS — M533 Sacrococcygeal disorders, not elsewhere classified: Secondary | ICD-10-CM

## 2013-06-17 DIAGNOSIS — M545 Low back pain, unspecified: Secondary | ICD-10-CM | POA: Insufficient documentation

## 2013-06-17 DIAGNOSIS — M961 Postlaminectomy syndrome, not elsewhere classified: Secondary | ICD-10-CM

## 2013-06-17 NOTE — Patient Instructions (Signed)
Follow up one month with RN for pill count and med refill 

## 2013-06-17 NOTE — Progress Notes (Signed)
Here for pill count and medication refills. Morphine Sulfate ER 15 mg # 90 Fill date  06/02/13   Today NV#42 Appropriate, Morphine Sulfate IR 15 mg # 30 fill date 06/02/13 Today NV# 15 appropriate.  No changes in medication list and pain level today is #9 today.  Recently lost her mother so this Christmas has been sad.  Return to see RN in one month for med refill and pill count and see MD in 2 month.  Opioid risk score re scored and score is zero.

## 2013-07-11 ENCOUNTER — Other Ambulatory Visit: Payer: Self-pay | Admitting: *Deleted

## 2013-07-11 MED ORDER — MORPHINE SULFATE 15 MG PO TABS: 15.0000 mg | ORAL_TABLET | Freq: Every day | ORAL | Status: DC

## 2013-07-11 MED ORDER — MORPHINE SULFATE ER 15 MG PO TBCR: 15.0000 mg | EXTENDED_RELEASE_TABLET | Freq: Three times a day (TID) | ORAL | Status: DC

## 2013-07-11 NOTE — Telephone Encounter (Signed)
RX printed early for controlled medication for the visit with RN on 07/16/13 (to be signed by MD)

## 2013-07-17 ENCOUNTER — Encounter: Payer: Medicare Other | Attending: Physical Medicine & Rehabilitation | Admitting: *Deleted

## 2013-07-17 ENCOUNTER — Encounter: Payer: Self-pay | Admitting: *Deleted

## 2013-07-17 VITALS — BP 135/67 | HR 81 | Resp 14 | Wt 199.6 lb

## 2013-07-17 DIAGNOSIS — M533 Sacrococcygeal disorders, not elsewhere classified: Secondary | ICD-10-CM

## 2013-07-17 DIAGNOSIS — M5430 Sciatica, unspecified side: Secondary | ICD-10-CM

## 2013-07-17 DIAGNOSIS — Z760 Encounter for issue of repeat prescription: Secondary | ICD-10-CM | POA: Insufficient documentation

## 2013-07-17 DIAGNOSIS — IMO0002 Reserved for concepts with insufficient information to code with codable children: Secondary | ICD-10-CM

## 2013-07-17 DIAGNOSIS — M961 Postlaminectomy syndrome, not elsewhere classified: Secondary | ICD-10-CM

## 2013-07-17 NOTE — Patient Instructions (Signed)
Follow up with Dr Letta Pate 08/13/13 @ 9:00

## 2013-07-17 NOTE — Progress Notes (Signed)
Here for pill count and medication refills. MS CONTIN 15 mg # 90 Fill date 07/01/13    Today NV#35 MSIR 15 mg # 12 Fill date 07/01/13 Today NV# 16.  Pill counts are appropriate Refill given for MS CONTIN and MSIR.   VSS    Pain level:10 today.  No changes in medication,  Pain level elevated due to cold and wind today makes her hurt more.  Return to see Dr Letta Pate in one month.  No falls in last 6 mo, moderate fall risk.

## 2013-07-30 ENCOUNTER — Encounter: Payer: Self-pay | Admitting: Internal Medicine

## 2013-07-30 ENCOUNTER — Other Ambulatory Visit: Payer: Self-pay | Admitting: Internal Medicine

## 2013-07-30 NOTE — Progress Notes (Signed)
Patient has changed care from [facility] to [facility]. Last office visit was 03/2013.

## 2013-08-06 ENCOUNTER — Ambulatory Visit: Payer: Medicare Other | Admitting: Physical Medicine & Rehabilitation

## 2013-08-12 ENCOUNTER — Other Ambulatory Visit: Payer: Self-pay | Admitting: *Deleted

## 2013-08-12 MED ORDER — MORPHINE SULFATE 15 MG PO TABS: 15.0000 mg | ORAL_TABLET | Freq: Every day | ORAL | Status: DC

## 2013-08-12 MED ORDER — MORPHINE SULFATE ER 15 MG PO TBCR: 15.0000 mg | EXTENDED_RELEASE_TABLET | Freq: Three times a day (TID) | ORAL | Status: DC

## 2013-08-12 NOTE — Telephone Encounter (Signed)
RX printed early for controlled medication for the visit with RN on 08/14/13 (to be signed by MD) 

## 2013-08-13 ENCOUNTER — Ambulatory Visit: Payer: Medicare Other | Admitting: Physical Medicine & Rehabilitation

## 2013-08-14 ENCOUNTER — Encounter: Payer: Self-pay | Admitting: *Deleted

## 2013-08-14 ENCOUNTER — Encounter: Payer: Medicare Other | Attending: Physical Medicine & Rehabilitation | Admitting: *Deleted

## 2013-08-14 VITALS — BP 118/67 | HR 72 | Resp 14

## 2013-08-14 DIAGNOSIS — M545 Low back pain, unspecified: Secondary | ICD-10-CM | POA: Insufficient documentation

## 2013-08-14 DIAGNOSIS — IMO0002 Reserved for concepts with insufficient information to code with codable children: Secondary | ICD-10-CM

## 2013-08-14 DIAGNOSIS — G8929 Other chronic pain: Secondary | ICD-10-CM | POA: Insufficient documentation

## 2013-08-14 DIAGNOSIS — M25519 Pain in unspecified shoulder: Secondary | ICD-10-CM | POA: Insufficient documentation

## 2013-08-14 DIAGNOSIS — M961 Postlaminectomy syndrome, not elsewhere classified: Secondary | ICD-10-CM | POA: Insufficient documentation

## 2013-08-14 DIAGNOSIS — I10 Essential (primary) hypertension: Secondary | ICD-10-CM | POA: Insufficient documentation

## 2013-08-14 DIAGNOSIS — M542 Cervicalgia: Secondary | ICD-10-CM | POA: Insufficient documentation

## 2013-08-14 DIAGNOSIS — M533 Sacrococcygeal disorders, not elsewhere classified: Secondary | ICD-10-CM | POA: Insufficient documentation

## 2013-08-14 DIAGNOSIS — K5090 Crohn's disease, unspecified, without complications: Secondary | ICD-10-CM | POA: Insufficient documentation

## 2013-08-14 NOTE — Patient Instructions (Signed)
Follow up one month with Dr Letta Pate or Algis Liming

## 2013-08-14 NOTE — Progress Notes (Signed)
Here for pill count and medication refills. MS CONTIN 15 mg # 90 Fill date 07/30/13 Today NV# 36 MSIR 15 mg #30  Fill date 07/30/13    Today NV#15 VSS   No falls.  Moderate fall risk and handout given for fall prevention int he home.

## 2013-08-19 ENCOUNTER — Other Ambulatory Visit: Payer: Self-pay

## 2013-08-19 MED ORDER — PROMETHAZINE HCL 25 MG PO TABS: 25.0000 mg | ORAL_TABLET | Freq: Four times a day (QID) | ORAL | Status: DC | PRN

## 2013-08-20 ENCOUNTER — Other Ambulatory Visit: Payer: Self-pay | Admitting: Physical Medicine & Rehabilitation

## 2013-08-31 ENCOUNTER — Other Ambulatory Visit: Payer: Self-pay | Admitting: Physical Medicine & Rehabilitation

## 2013-09-12 ENCOUNTER — Encounter: Payer: Medicare Other | Attending: Physical Medicine & Rehabilitation | Admitting: Physical Medicine and Rehabilitation

## 2013-09-12 ENCOUNTER — Encounter: Payer: Self-pay | Admitting: Physical Medicine and Rehabilitation

## 2013-09-12 VITALS — BP 131/94 | HR 80 | Resp 14 | Ht 66.0 in | Wt 205.0 lb

## 2013-09-12 DIAGNOSIS — M961 Postlaminectomy syndrome, not elsewhere classified: Secondary | ICD-10-CM

## 2013-09-12 DIAGNOSIS — IMO0002 Reserved for concepts with insufficient information to code with codable children: Secondary | ICD-10-CM | POA: Insufficient documentation

## 2013-09-12 MED ORDER — DICLOFENAC SODIUM 1 % TD GEL: 2.0000 g | Freq: Four times a day (QID) | TRANSDERMAL | Status: DC

## 2013-09-12 MED ORDER — TRAZODONE HCL 50 MG PO TABS: 25.0000 mg | ORAL_TABLET | Freq: Every day | ORAL | Status: DC

## 2013-09-12 MED ORDER — MORPHINE SULFATE ER 15 MG PO TBCR: 15.0000 mg | EXTENDED_RELEASE_TABLET | Freq: Three times a day (TID) | ORAL | Status: DC

## 2013-09-12 MED ORDER — MORPHINE SULFATE 15 MG PO TABS: 15.0000 mg | ORAL_TABLET | Freq: Every day | ORAL | Status: DC
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
## 2013-09-12 NOTE — Patient Instructions (Signed)
Trazodone--Start taking 1/2 pill at bedtime for sleep. Increase to one pill at bedtime as needed. Try flexeril at night time--use 1/2 a pill to help with muscle pain at nights. Capsicin cream is another cream you can use if voltaren gel is not covered

## 2013-10-16 ENCOUNTER — Encounter: Payer: Self-pay | Admitting: Registered Nurse

## 2013-10-16 ENCOUNTER — Encounter: Payer: Medicare Other | Attending: Physical Medicine & Rehabilitation | Admitting: Registered Nurse

## 2013-10-16 VITALS — BP 136/86 | HR 91 | Resp 14 | Ht 66.0 in | Wt 211.4 lb

## 2013-10-16 DIAGNOSIS — Z79899 Other long term (current) drug therapy: Secondary | ICD-10-CM

## 2013-10-16 DIAGNOSIS — IMO0002 Reserved for concepts with insufficient information to code with codable children: Secondary | ICD-10-CM

## 2013-10-16 DIAGNOSIS — Z5181 Encounter for therapeutic drug level monitoring: Secondary | ICD-10-CM

## 2013-10-16 DIAGNOSIS — M961 Postlaminectomy syndrome, not elsewhere classified: Secondary | ICD-10-CM | POA: Insufficient documentation

## 2013-10-16 MED ORDER — CYCLOBENZAPRINE HCL 10 MG PO TABS: 10.0000 mg | ORAL_TABLET | Freq: Three times a day (TID) | ORAL | Status: DC | PRN

## 2013-10-16 MED ORDER — MORPHINE SULFATE ER 15 MG PO TBCR: 15.0000 mg | EXTENDED_RELEASE_TABLET | Freq: Three times a day (TID) | ORAL | Status: DC

## 2013-10-16 MED ORDER — MORPHINE SULFATE 15 MG PO TABS: 15.0000 mg | ORAL_TABLET | Freq: Every day | ORAL | Status: DC

## 2013-10-16 NOTE — Progress Notes (Signed)
Subjective:    Patient ID: [name] [name], female    DOB: Mar 15, 1956, 58 y.o.   MRN: [id number]  HPI: [name] [name] is a 58 year old female who returns for follow up for chronic pain and medication refill.She says she's having pain in her right shoulder and lower back. The pain in her shoulder has increased in intensity she is scheduled for steroid injection with Dr. Letta Pate in May. She has a back supporter on. She rates her pain 10. She says last week her pain was unbearable she related it to the weather. She's starting to feel a liitle bit better today. Her current exercise regime is walking and she will be starting water aerobics next week at the [facility].    Pain Inventory Average Pain 10 Pain Right Now 10 My pain is constant, sharp, stabbing and aching  In the last 24 hours, has pain interfered with the following? General activity 10 Relation with others 10 Enjoyment of life 10 What TIME of day is your pain at its worst? all Sleep (in general) Poor  Pain is worse with: walking, bending, sitting, inactivity and standing Pain improves with: rest, heat/ice and medication Relief from Meds: 2  Mobility use a cane ability to climb steps?  yes do you drive?  yes  Function disabled: date disabled 2009 I need assistance with the following:  meal prep, household duties and shopping  Neuro/Psych trouble walking spasms  Prior Studies Any changes since last visit?  no  Physicians involved in your care Any changes since last visit?  no   Family History  Problem Relation Age of Onset  . Heart disease Mother   . Hypertension Mother    History   Social History  . Marital Status: Widowed    Spouse Name: N/A    Number of Children: N/A  . Years of Education: N/A   Social History Main Topics  . Smoking status: Former Smoker -- 0.50 packs/day for 20 years    Types: Cigarettes    Quit date: 07/21/2008  . Smokeless tobacco: Never Used  . Alcohol Use: No  . Drug  Use: No  . Sexual Activity: None   Other Topics Concern  . None   Social History Narrative  . None   Past Surgical History  Procedure Laterality Date  . Spine surgery    . Abdominal hysterectomy    . Colon surgery  05/11/11    [facility] Lysis of adhesions Crohn's   Past Medical History  Diagnosis Date  . Disorder of sacroiliac joint   . Lumbosacral neuritis   . Lumbosacral radiculitis   . Lumbar post-laminectomy syndrome   . Sciatica   . Lumbago   . Hypertension   . Crohn's disease    BP 136/86  Pulse 91  Resp 14  Ht 5\' 6"  (1.676 m)  Wt 211 lb 6.4 oz (95.89 kg)  BMI 34.14 kg/m2  SpO2 98%  Opioid Risk Score:   Fall Risk Score: Moderate Fall Risk (6-13 points) (educated and handout given on fall prevention in the home at  prev appt.)  Review of Systems  Musculoskeletal: Positive for back pain and gait problem.       Arm pain/spasms  All other systems reviewed and are negative.      Objective:   Physical Exam  Nursing note and vitals reviewed. Constitutional: She is oriented to person, place, and time. She appears well-developed and well-nourished.  HENT:  Head: Normocephalic.  Neck: Normal range of motion.  Neck supple.  Cardiovascular: Normal rate and regular rhythm.   Pulmonary/Chest: Effort normal and breath sounds normal.  Musculoskeletal:  Normal Muscle Bulk: Muscle Testing Reveals: Left arm full ROM strength 5/5 Right arm decreased ROM 45 degrees and strength 4/5 Tenderness noted at the Suprapinatus and Acromion Decreased Range of motion with Abduction and Adduction. Unable to perform Neer's Test/ Painful/ Impingement. Lumbar Paraspinal Tenderness: LL-3- L-5. Lower Extremities: Left Leg Flexion: Tightness Right Leg Flexion: Pain radiates to Right hip and lower Back. Arises from chair with some difficulty. Antalgic gait Walks with a cane  Neurological: She is alert and oriented to person, place, and time.  Skin: Skin is warm and dry.  Psychiatric: She  has a normal mood and affect.          Assessment & Plan:  1.Low back pain with radiating symptoms in L3-4 distribution: continues to be limited by pain. Continue MS contin tid with MSIR at noon for breakthrough pain.  Refilled: MS contin 15 mg # 90 pills---use one pill every 8 hours  MSIR 15 mg # 30 pills--use one pill at noon  Continue exercise regime. 2. Insomnia: has improved with Trazodone  3. Right Shoulder Impingement: Steroid Injection Schedule with Dr. Letta Pate in May. Continue Current Medication regime.   20 minutes of face to face patient care time was spent during this visit. All questions were encouraged and answered.  F/U in 1 month

## 2013-10-29 ENCOUNTER — Ambulatory Visit (HOSPITAL_BASED_OUTPATIENT_CLINIC_OR_DEPARTMENT_OTHER): Payer: Medicare Other | Admitting: Physical Medicine & Rehabilitation

## 2013-10-29 ENCOUNTER — Encounter: Payer: Medicare Other | Attending: Physical Medicine & Rehabilitation

## 2013-10-29 ENCOUNTER — Encounter: Payer: Self-pay | Admitting: Physical Medicine & Rehabilitation

## 2013-10-29 VITALS — BP 156/69 | HR 81 | Resp 14 | Ht 65.0 in | Wt 209.4 lb

## 2013-10-29 DIAGNOSIS — M75100 Unspecified rotator cuff tear or rupture of unspecified shoulder, not specified as traumatic: Secondary | ICD-10-CM

## 2013-10-29 DIAGNOSIS — M7550 Bursitis of unspecified shoulder: Secondary | ICD-10-CM

## 2013-10-29 DIAGNOSIS — M961 Postlaminectomy syndrome, not elsewhere classified: Secondary | ICD-10-CM | POA: Insufficient documentation

## 2013-10-29 DIAGNOSIS — IMO0002 Reserved for concepts with insufficient information to code with codable children: Secondary | ICD-10-CM | POA: Insufficient documentation

## 2013-10-29 NOTE — Progress Notes (Signed)
Shoulder injectionRight  (without ultrasound guidance)  Indication:Right Shoulder pain not relieved by medication management and other conservative care.  Informed consent was obtained after describing risks and benefits of the procedure with the patient, this includes bleeding, bruising, infection and medication side effects. The patient wishes to proceed and has given written consent. Patient was placed in a seated position. The Right shoulder was marked and prepped with betadine in the subacromial area. A 25-gauge 1-1/2 inch needle was inserted into the subacromial area. After negative draw back for blood, a solution containing 1 mL of 6 mg per ML betamethasone and 4 mL of 1% lidocaine was injected. A band aid was applied. The patient tolerated the procedure well. Post procedure instructions were given.

## 2013-10-29 NOTE — Patient Instructions (Signed)
Keep 5/28 appt

## 2013-11-07 ENCOUNTER — Other Ambulatory Visit: Payer: Self-pay | Admitting: Physical Medicine and Rehabilitation

## 2013-11-14 ENCOUNTER — Ambulatory Visit (HOSPITAL_BASED_OUTPATIENT_CLINIC_OR_DEPARTMENT_OTHER): Payer: Medicare Other | Admitting: Physical Medicine & Rehabilitation

## 2013-11-14 ENCOUNTER — Encounter: Payer: Self-pay | Admitting: Physical Medicine & Rehabilitation

## 2013-11-14 VITALS — BP 146/82 | HR 86 | Resp 14 | Ht 66.0 in | Wt 210.0 lb

## 2013-11-14 DIAGNOSIS — M7541 Impingement syndrome of right shoulder: Secondary | ICD-10-CM | POA: Insufficient documentation

## 2013-11-14 DIAGNOSIS — IMO0002 Reserved for concepts with insufficient information to code with codable children: Secondary | ICD-10-CM

## 2013-11-14 DIAGNOSIS — M961 Postlaminectomy syndrome, not elsewhere classified: Secondary | ICD-10-CM

## 2013-11-14 DIAGNOSIS — M75100 Unspecified rotator cuff tear or rupture of unspecified shoulder, not specified as traumatic: Secondary | ICD-10-CM

## 2013-11-14 MED ORDER — MORPHINE SULFATE ER 15 MG PO TBCR: 15.0000 mg | EXTENDED_RELEASE_TABLET | Freq: Three times a day (TID) | ORAL | Status: DC

## 2013-11-14 MED ORDER — MORPHINE SULFATE 15 MG PO TABS: 15.0000 mg | ORAL_TABLET | Freq: Every day | ORAL | Status: DC

## 2013-11-14 NOTE — Progress Notes (Signed)
Subjective:    Patient ID: [name] [name], female    DOB: [birth year], 58 y.o.   MRN: [id number]  HPI  Right shoulder pain improved after injection for about 3-4 days and now pain has started to come back. Has been doing home exercises every day. Pain Inventory Average Pain 9 Pain Right Now 9 My pain is constant, sharp, stabbing and aching  In the last 24 hours, has pain interfered with the following? General activity 9 Relation with others 9 Enjoyment of life 9 What TIME of day is your pain at its worst? constant all day Sleep (in general) Poor  Pain is worse with: walking, bending, sitting, inactivity and standing Pain improves with: rest, heat/ice and medication Relief from Meds: 2  Mobility walk with assistance use a cane ability to climb steps?  yes use a wheelchair Do you have any goals in this area?  yes  Function disabled: date disabled 10/2007 I need assistance with the following:  household duties and shopping Do you have any goals in this area?  yes  Neuro/Psych trouble walking spasms  Prior Studies Any changes since last visit?  no  Physicians involved in your care Any changes since last visit?  no   Family History  Problem Relation Age of Onset  . Heart disease Mother   . Hypertension Mother    History   Social History  . Marital Status: Widowed    Spouse Name: N/A    Number of Children: N/A  . Years of Education: N/A   Social History Main Topics  . Smoking status: Former Smoker -- 0.50 packs/day for 20 years    Types: Cigarettes    Quit date: 07/21/2008  . Smokeless tobacco: Never Used  . Alcohol Use: No  . Drug Use: No  . Sexual Activity: None   Other Topics Concern  . None   Social History Narrative  . None   Past Surgical History  Procedure Laterality Date  . Spine surgery    . Abdominal hysterectomy    . Colon surgery  05/11/11    [facility] Lysis of adhesions Crohn's   Past Medical History  Diagnosis Date  . Disorder of  sacroiliac joint   . Lumbosacral neuritis   . Lumbosacral radiculitis   . Lumbar post-laminectomy syndrome   . Sciatica   . Lumbago   . Hypertension   . Crohn's disease    BP 146/82  Pulse 86  Resp 14  Ht 5\' 6"  (1.676 m)  Wt 210 lb (95.255 kg)  BMI 33.91 kg/m2  SpO2 97%  Opioid Risk Score:   Fall Risk Score: Moderate Fall Risk (6-13 points) (pt educated on fall risk, brochure given to pt previously)    Review of Systems  Musculoskeletal: Positive for back pain, gait problem and neck pain.  Neurological:       Spasms  All other systems reviewed and are negative.      Objective:   Physical Exam  + impingement sign      Assessment & Plan:  . His right subacromial impingement syndrome with partial relief after injection. Will reinject today, referral to physical therapy at   Shoulder injectionRight  (without ultrasound guidance)  Indication:Right Shoulder pain not relieved by medication management and other conservative care.  Informed consent was obtained after describing risks and benefits of the procedure with the patient, this includes bleeding, bruising, infection and medication side effects. The patient wishes to proceed and has given written consent. Patient was placed  in a seated position. The Right shoulder was marked and prepped with betadine in the subacromial area. A 25-gauge 1-1/2 inch needle was inserted into the subacromial area. After negative draw back for blood, a solution containing 1 mL of 6 mg per ML betamethasone and 4 mL of 1% lidocaine was injected. A band aid was applied. The patient tolerated the procedure well. Post procedure instructions were given.  Immediate pain relief post injection, able to abduct her shoulder without pain

## 2013-11-14 NOTE — Patient Instructions (Signed)
Will make a referral to physical therapy. See you in one month

## 2013-12-04 ENCOUNTER — Ambulatory Visit: Payer: Medicare Other | Attending: Physical Medicine & Rehabilitation | Admitting: Physical Therapy

## 2013-12-09 ENCOUNTER — Encounter: Payer: Medicare Other | Attending: Physical Medicine & Rehabilitation

## 2013-12-09 ENCOUNTER — Encounter: Payer: Self-pay | Admitting: Physical Medicine & Rehabilitation

## 2013-12-09 ENCOUNTER — Ambulatory Visit (HOSPITAL_BASED_OUTPATIENT_CLINIC_OR_DEPARTMENT_OTHER): Payer: Medicare Other | Admitting: Physical Medicine & Rehabilitation

## 2013-12-09 VITALS — BP 132/80 | HR 79 | Resp 16 | Ht 66.0 in | Wt 210.0 lb

## 2013-12-09 DIAGNOSIS — M75100 Unspecified rotator cuff tear or rupture of unspecified shoulder, not specified as traumatic: Secondary | ICD-10-CM

## 2013-12-09 DIAGNOSIS — M961 Postlaminectomy syndrome, not elsewhere classified: Secondary | ICD-10-CM | POA: Insufficient documentation

## 2013-12-09 DIAGNOSIS — IMO0002 Reserved for concepts with insufficient information to code with codable children: Secondary | ICD-10-CM | POA: Insufficient documentation

## 2013-12-09 DIAGNOSIS — M7541 Impingement syndrome of right shoulder: Secondary | ICD-10-CM

## 2013-12-09 MED ORDER — MORPHINE SULFATE 15 MG PO TABS: 15.0000 mg | ORAL_TABLET | Freq: Every day | ORAL | Status: DC

## 2013-12-09 MED ORDER — MORPHINE SULFATE ER 15 MG PO TBCR: 15.0000 mg | EXTENDED_RELEASE_TABLET | Freq: Three times a day (TID) | ORAL | Status: DC
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
## 2013-12-09 NOTE — Patient Instructions (Signed)

## 2014-01-07 ENCOUNTER — Encounter: Payer: Self-pay | Admitting: Physical Medicine & Rehabilitation

## 2014-01-07 ENCOUNTER — Encounter: Payer: Medicare Other | Attending: Physical Medicine & Rehabilitation

## 2014-01-07 ENCOUNTER — Ambulatory Visit (HOSPITAL_BASED_OUTPATIENT_CLINIC_OR_DEPARTMENT_OTHER): Payer: Medicare Other | Admitting: Physical Medicine & Rehabilitation

## 2014-01-07 VITALS — BP 141/72 | HR 72 | Resp 14 | Ht 66.0 in | Wt 208.0 lb

## 2014-01-07 DIAGNOSIS — IMO0002 Reserved for concepts with insufficient information to code with codable children: Secondary | ICD-10-CM

## 2014-01-07 DIAGNOSIS — M961 Postlaminectomy syndrome, not elsewhere classified: Secondary | ICD-10-CM | POA: Diagnosis present

## 2014-01-07 MED ORDER — MORPHINE SULFATE ER 15 MG PO TBCR: 15.0000 mg | EXTENDED_RELEASE_TABLET | Freq: Three times a day (TID) | ORAL | Status: DC

## 2014-01-07 MED ORDER — MORPHINE SULFATE 15 MG PO TABS
15.0000 mg | ORAL_TABLET | Freq: Every day | ORAL | Status: DC
Start: 2014-01-07 — End: 2014-02-04

## 2014-01-07 NOTE — Progress Notes (Signed)
Subjective:    Patient ID: [name] [name], female    DOB: 05-31-1956, 58 y.o.   MRN: [id number]  HPI Thinking about moving to [facility] Shoulder is doing ok, s/p palpation guided injection plus shoulder exercise Pain Inventory Average Pain 10 Pain Right Now 9 My pain is constant, sharp, burning, stabbing and aching  In the last 24 hours, has pain interfered with the following? General activity 10 Relation with others 10 Enjoyment of life 10 What TIME of day is your pain at its worst? constant Sleep (in general) Poor  Pain is worse with: walking, bending, sitting, inactivity and standing Pain improves with: rest, heat/ice and medication Relief from Meds: 2  Mobility use a cane how many minutes can you walk? 5 ability to climb steps?  yes do you drive?  yes use a wheelchair Do you have any goals in this area?  yes  Function disabled: date disabled 10/2007 I need assistance with the following:  meal prep, household duties and shopping  Neuro/Psych trouble walking spasms  Prior Studies Any changes since last visit?  no  Physicians involved in your care Any changes since last visit?  no   Family History  Problem Relation Age of Onset  . Heart disease Mother   . Hypertension Mother    History   Social History  . Marital Status: Widowed    Spouse Name: N/A    Number of Children: N/A  . Years of Education: N/A   Social History Main Topics  . Smoking status: Former Smoker -- 0.50 packs/day for 20 years    Types: Cigarettes    Quit date: 07/21/2008  . Smokeless tobacco: Never Used  . Alcohol Use: No  . Drug Use: No  . Sexual Activity: None   Other Topics Concern  . None   Social History Narrative  . None   Past Surgical History  Procedure Laterality Date  . Spine surgery    . Abdominal hysterectomy    . Colon surgery  05/11/11    [facility] Lysis of adhesions Crohn's   Past Medical History  Diagnosis Date  . Disorder of sacroiliac joint   .  Lumbosacral neuritis   . Lumbosacral radiculitis   . Lumbar post-laminectomy syndrome   . Sciatica   . Lumbago   . Hypertension   . Crohn's disease    BP 141/72  Pulse 72  Resp 14  Ht 5\' 6"  (1.676 m)  Wt 208 lb (94.348 kg)  BMI 33.59 kg/m2  SpO2 98%  Opioid Risk Score:   Fall Risk Score: Low Fall Risk (0-5 points) (patient educated handout declined)   Review of Systems  Musculoskeletal: Positive for back pain, gait problem and neck pain.  Neurological:       Spasm  All other systems reviewed and are negative.      Objective:   Physical Exam  Constitutional: She is oriented to person, place, and time. She appears well-developed and well-nourished.  Musculoskeletal:       Lumbar back: She exhibits decreased range of motion and tenderness. She exhibits no spasm.  Tenderness L4 paraspinal  No cervical paraspinal tenderness  Neurological: She is alert and oriented to person, place, and time. She has normal strength. No sensory deficit.  Psychiatric: She has a normal mood and affect.    Right shoulder negative impingement Painfree ROM Mood/affect apporpriate Gen NAD     Assessment & Plan:  1. A lumbar postlaminectomy syndrome L3-4-5, March 2008, Jan 2009 with chronic  postoperative pain  2. 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 Right subacromial bursitis , doing better, no need for reinjection at the current time

## 2014-01-07 NOTE — Patient Instructions (Signed)
Try to remain as active as possible continue your shoulder exercises  I will give you some back exercises you can try as well  Back Exercises These exercises may help you when beginning to rehabilitate your injury. Your symptoms may resolve with or without further involvement from your physician, physical therapist or athletic trainer. While completing these exercises, remember:   Restoring tissue flexibility helps normal motion to return to the joints. This allows healthier, less painful movement and activity.  An effective stretch should be held for at least 30 seconds.  A stretch should never be painful. You should only feel a gentle lengthening or release in the stretched tissue. STRETCH - Extension, Prone on Elbows   Lie on your stomach on the floor, a bed will be too soft. Place your palms about shoulder width apart and at the height of your head.  Place your elbows under your shoulders. If this is too painful, stack pillows under your chest.  Allow your body to relax so that your hips drop lower and make contact more completely with the floor.  Hold this position for __________ seconds.  Slowly return to lying flat on the floor. Repeat __________ times. Complete this exercise __________ times per day.  RANGE OF MOTION - Extension, Prone Press Ups   Lie on your stomach on the floor, a bed will be too soft. Place your palms about shoulder width apart and at the height of your head.  Keeping your back as relaxed as possible, slowly straighten your elbows while keeping your hips on the floor. You may adjust the placement of your hands to maximize your comfort. As you gain motion, your hands will come more underneath your shoulders.  Hold this position __________ seconds.  Slowly return to lying flat on the floor. Repeat __________ times. Complete this exercise __________ times per day.  RANGE OF MOTION- Quadruped, Neutral Spine   Assume a hands and knees position on a firm  surface. Keep your hands under your shoulders and your knees under your hips. You may place padding under your knees for comfort.  Drop your head and point your tail bone toward the ground below you. This will round out your low back like an angry cat. Hold this position for __________ seconds.  Slowly lift your head and release your tail bone so that your back sags into a large arch, like an old horse.  Hold this position for __________ seconds.  Repeat this until you feel limber in your low back.  Now, find your "sweet spot." This will be the most comfortable position somewhere between the two previous positions. This is your neutral spine. Once you have found this position, tense your stomach muscles to support your low back.  Hold this position for __________ seconds. Repeat __________ times. Complete this exercise __________ times per day.  STRETCH - Flexion, Single Knee to Chest   Lie on a firm bed or floor with both legs extended in front of you.  Keeping one leg in contact with the floor, bring your opposite knee to your chest. Hold your leg in place by either grabbing behind your thigh or at your knee.  Pull until you feel a gentle stretch in your low back. Hold __________ seconds.  Slowly release your grasp and repeat the exercise with the opposite side. Repeat __________ times. Complete this exercise __________ times per day.  STRETCH - Hamstrings, Standing  Stand or sit and extend your right / left leg, placing your foot on  a chair or foot stool  Keeping a slight arch in your low back and your hips straight forward.  Lead with your chest and lean forward at the waist until you feel a gentle stretch in the back of your right / left knee or thigh. (When done correctly, this exercise requires leaning only a small distance.)  Hold this position for __________ seconds. Repeat __________ times. Complete this stretch __________ times per day. STRENGTHENING - Deep Abdominals,  Pelvic Tilt   Lie on a firm bed or floor. Keeping your legs in front of you, bend your knees so they are both pointed toward the ceiling and your feet are flat on the floor.  Tense your lower abdominal muscles to press your low back into the floor. This motion will rotate your pelvis so that your tail bone is scooping upwards rather than pointing at your feet or into the floor.  With a gentle tension and even breathing, hold this position for __________ seconds. Repeat __________ times. Complete this exercise __________ times per day.  STRENGTHENING - Abdominals, Crunches   Lie on a firm bed or floor. Keeping your legs in front of you, bend your knees so they are both pointed toward the ceiling and your feet are flat on the floor. Cross your arms over your chest.  Slightly tip your chin down without bending your neck.  Tense your abdominals and slowly lift your trunk high enough to just clear your shoulder blades. Lifting higher can put excessive stress on the low back and does not further strengthen your abdominal muscles.  Control your return to the starting position. Repeat __________ times. Complete this exercise __________ times per day.  STRENGTHENING - Quadruped, Opposite UE/LE Lift   Assume a hands and knees position on a firm surface. Keep your hands under your shoulders and your knees under your hips. You may place padding under your knees for comfort.  Find your neutral spine and gently tense your abdominal muscles so that you can maintain this position. Your shoulders and hips should form a rectangle that is parallel with the floor and is not twisted.  Keeping your trunk steady, lift your right hand no higher than your shoulder and then your left leg no higher than your hip. Make sure you are not holding your breath. Hold this position __________ seconds.  Continuing to keep your abdominal muscles tense and your back steady, slowly return to your starting position. Repeat with  the opposite arm and leg. Repeat __________ times. Complete this exercise __________ times per day. Document Released: 06/24/2005 Document Revised: 08/29/2011 Document Reviewed: 09/18/2008 [facility] Patient Information 2015 [place], [place]. This information is not intended to replace advice given to you by your health care provider. Make sure you discuss any questions you have with your health care provider.

## 2014-01-13 ENCOUNTER — Other Ambulatory Visit: Payer: Self-pay | Admitting: Physical Medicine & Rehabilitation

## 2014-01-14 ENCOUNTER — Other Ambulatory Visit: Payer: Self-pay | Admitting: Physical Medicine & Rehabilitation

## 2014-01-26 ENCOUNTER — Other Ambulatory Visit: Payer: Self-pay | Admitting: Physical Medicine and Rehabilitation

## 2014-02-04 ENCOUNTER — Encounter: Payer: Medicare Other | Attending: Physical Medicine & Rehabilitation | Admitting: Registered Nurse

## 2014-02-04 ENCOUNTER — Encounter: Payer: Self-pay | Admitting: Registered Nurse

## 2014-02-04 VITALS — BP 130/64 | HR 67 | Resp 14 | Ht 66.0 in | Wt 213.0 lb

## 2014-02-04 DIAGNOSIS — IMO0002 Reserved for concepts with insufficient information to code with codable children: Secondary | ICD-10-CM

## 2014-02-04 DIAGNOSIS — M961 Postlaminectomy syndrome, not elsewhere classified: Secondary | ICD-10-CM

## 2014-02-04 DIAGNOSIS — M545 Low back pain, unspecified: Secondary | ICD-10-CM | POA: Insufficient documentation

## 2014-02-04 DIAGNOSIS — Z5181 Encounter for therapeutic drug level monitoring: Secondary | ICD-10-CM

## 2014-02-04 DIAGNOSIS — Z79899 Other long term (current) drug therapy: Secondary | ICD-10-CM

## 2014-02-04 MED ORDER — MORPHINE SULFATE ER 15 MG PO TBCR: 15.0000 mg | EXTENDED_RELEASE_TABLET | Freq: Three times a day (TID) | ORAL | Status: DC

## 2014-02-04 MED ORDER — MORPHINE SULFATE 15 MG PO TABS: 15.0000 mg | ORAL_TABLET | Freq: Every day | ORAL | Status: DC

## 2014-02-04 NOTE — Progress Notes (Signed)
Subjective:    Patient ID: [name] [name], female    DOB: 1955/12/05, 58 y.o.   MRN: [id number]  HPI: [name] is a 58 year old female who returns for follow up for chronic pain and medication refill.She says her pain is located in her lower back and it radiates down her right leg laterally at times. She has a back supporter on. She rates her pain 9. Her current exercise regime is performing stretching exercises daily. She says the cortisone injections in her right shoulder has helped tremendously. She is still pain free in her right shoulder and has been able to perform her stretching exercises more freely. She is awaiting to see her neurologist, hopefully by September she states.  Pain Inventory Average Pain 10 Pain Right Now 9 My pain is constant, sharp, burning, stabbing and aching  In the last 24 hours, has pain interfered with the following? General activity 10 Relation with others 10 Enjoyment of life 10 What TIME of day is your pain at its worst? constant all day Sleep (in general) Poor  Pain is worse with: walking, bending, sitting, inactivity and standing Pain improves with: rest, heat/ice and medication Relief from Meds: na  Mobility walk with assistance use a cane ability to climb steps?  yes use a wheelchair Do you have any goals in this area?  yes  Function disabled: date disabled 10/2007 I need assistance with the following:  household duties and shopping  Neuro/Psych spasms  Prior Studies Any changes since last visit?  no  Physicians involved in your care Any changes since last visit?  no   Family History  Problem Relation Age of Onset  . Heart disease Mother   . Hypertension Mother    History   Social History  . Marital Status: Widowed    Spouse Name: N/A    Number of Children: N/A  . Years of Education: N/A   Social History Main Topics  . Smoking status: Former Smoker -- 0.50 packs/day for 20 years    Types: Cigarettes   Quit date: 07/21/2008  . Smokeless tobacco: Never Used  . Alcohol Use: No  . Drug Use: No  . Sexual Activity: None   Other Topics Concern  . None   Social History Narrative  . None   Past Surgical History  Procedure Laterality Date  . Spine surgery    . Abdominal hysterectomy    . Colon surgery  05/11/11    [facility] Lysis of adhesions Crohn's   Past Medical History  Diagnosis Date  . Disorder of sacroiliac joint   . Lumbosacral neuritis   . Lumbosacral radiculitis   . Lumbar post-laminectomy syndrome   . Sciatica   . Lumbago   . Hypertension   . Crohn's disease    BP 130/64  Pulse 67  Resp 14  Ht 5\' 6"  (1.676 m)  Wt 213 lb (96.616 kg)  BMI 34.40 kg/m2  SpO2 95%  Opioid Risk Score:   Fall Risk Score: Moderate Fall Risk (6-13 points) (pt educated, brochure declined)   Review of Systems  Musculoskeletal: Positive for back pain and neck pain.  Neurological:       Spasms  All other systems reviewed and are negative.      Objective:   Physical Exam  Nursing note and vitals reviewed. Constitutional: She is oriented to person, place, and time. She appears well-developed and well-nourished.  HENT:  Head: Normocephalic and atraumatic.  Neck: Normal range of motion. Neck  supple.  Cardiovascular: Normal rate and regular rhythm.   Pulmonary/Chest: Effort normal and breath sounds normal.  Musculoskeletal:  Normal Muscle Bulk and Muscle testing Reveals: Upper Extremities: Full ROM and Muscle Strength 4/5 on Left. Right Decreased ROM 90 Degrees Spinal Forward Flexion 30 Degrees and Extension 10 Degrees Lower Extremities: Full ROM and Muscle Strength 5.5 Right Leg Flexion Produces Pain into Lumbar Arises from chair with slight difficulty/  Using cane for support  Neurological: She is alert and oriented to person, place, and time.  Skin: Skin is warm and dry.  Psychiatric: She has a normal mood and affect.          Assessment & Plan:  1.Low back pain with  radiating symptoms in L3-4 distribution: continues to be limited by pain. Continue MS contin tid with MSIR at noon for breakthrough pain.  Refilled: MS contin 15 mg # 90 pills---use one pill every 8 hours  MSIR 15 mg # 30 pills--use one pill at noon  Continue exercise regime.  2. Insomnia: has improved with Trazodone  3. Right Shoulder Impingement: S/P Steroid Injection with Good Results. Continue exercise regime.   20 minutes of face to face patient care time was spent during this visit. All questions were encouraged and answered.   F/U in 1 month

## 2014-03-04 ENCOUNTER — Encounter: Payer: Medicare Other | Attending: Physical Medicine & Rehabilitation | Admitting: Registered Nurse

## 2014-03-04 ENCOUNTER — Encounter: Payer: Self-pay | Admitting: Registered Nurse

## 2014-03-04 VITALS — BP 122/84 | HR 79 | Resp 14 | Wt 206.2 lb

## 2014-03-04 DIAGNOSIS — IMO0002 Reserved for concepts with insufficient information to code with codable children: Secondary | ICD-10-CM | POA: Diagnosis not present

## 2014-03-04 DIAGNOSIS — Z5181 Encounter for therapeutic drug level monitoring: Secondary | ICD-10-CM

## 2014-03-04 DIAGNOSIS — H469 Unspecified optic neuritis: Secondary | ICD-10-CM

## 2014-03-04 DIAGNOSIS — M5414 Radiculopathy, thoracic region: Secondary | ICD-10-CM

## 2014-03-04 DIAGNOSIS — Z79899 Other long term (current) drug therapy: Secondary | ICD-10-CM

## 2014-03-04 DIAGNOSIS — M961 Postlaminectomy syndrome, not elsewhere classified: Secondary | ICD-10-CM

## 2014-03-04 DIAGNOSIS — M5417 Radiculopathy, lumbosacral region: Secondary | ICD-10-CM

## 2014-03-04 MED ORDER — MORPHINE SULFATE 15 MG PO TABS: 15.0000 mg | ORAL_TABLET | Freq: Every day | ORAL | Status: DC

## 2014-03-04 MED ORDER — MORPHINE SULFATE ER 15 MG PO TBCR: 15.0000 mg | EXTENDED_RELEASE_TABLET | Freq: Three times a day (TID) | ORAL | Status: DC

## 2014-03-04 NOTE — Progress Notes (Signed)
Subjective:    Patient ID: [name] [name], female    DOB: [birth year], 59 y.o.   MRN: [id number]  HPI: [name] [name] is a 58 year old female who returns for follow up for chronic pain and medication refill.She says her pain is located in her lower back. She has her back supporter on. She rates her pain 10. Her current exercise regime is performing stretching exercises daily and walking.  She says her case has settled, she will now be able to see a neurologist.  Pain Inventory Average Pain 10 Pain Right Now 10 My pain is sharp, burning, stabbing and aching  In the last 24 hours, has pain interfered with the following? General activity 10 Relation with others 10 Enjoyment of life 10 What TIME of day is your pain at its worst? all Sleep (in general) Poor  Pain is worse with: walking, bending, sitting, inactivity and standing Pain improves with: rest, heat/ice and medication Relief from Meds: 2  Mobility use a cane ability to climb steps?  yes do you drive?  yes  Function disabled: date disabled 10/2007 I need assistance with the following:  meal prep, household duties and shopping  Neuro/Psych trouble walking spasms  Prior Studies Any changes since last visit?  no  Physicians involved in your care Any changes since last visit?  no   Family History  Problem Relation Age of Onset  . Heart disease Mother   . Hypertension Mother    History   Social History  . Marital Status: Widowed    Spouse Name: N/A    Number of Children: N/A  . Years of Education: N/A   Social History Main Topics  . Smoking status: Former Smoker -- 0.50 packs/day for 20 years    Types: Cigarettes    Quit date: 07/21/2008  . Smokeless tobacco: Never Used  . Alcohol Use: No  . Drug Use: No  . Sexual Activity: None   Other Topics Concern  . None   Social History Narrative  . None   Past Surgical History  Procedure Laterality Date  . Spine surgery    . Abdominal  hysterectomy    . Colon surgery  05/11/11    [facility] Lysis of adhesions Crohn's   Past Medical History  Diagnosis Date  . Disorder of sacroiliac joint   . Lumbosacral neuritis   . Lumbosacral radiculitis   . Lumbar post-laminectomy syndrome   . Sciatica   . Lumbago   . Hypertension   . Crohn's disease    BP 122/84  Pulse 79  Resp 14  Wt 206 lb 3.2 oz (93.532 kg)  SpO2 100%  Opioid Risk Score:   Fall Risk Score: Moderate Fall Risk (6-13 points) (previously educated and given handout 08/14/13)   Review of Systems  Musculoskeletal: Positive for gait problem.       Spasms       Objective:   Physical Exam  Nursing note and vitals reviewed. Constitutional: She is oriented to person, place, and time. She appears well-developed and well-nourished.  HENT:  Head: Normocephalic and atraumatic.  Neck: Normal range of motion. Neck supple.  Cardiovascular: Normal rate and regular rhythm.   Pulmonary/Chest: Effort normal and breath sounds normal.  Musculoskeletal:  Normal Muscle Bulk and Muscle Testing Reveals: Upper Extremities: Left Full ROM and Muscle Strength 4/5 and Right Decreased ROM 90 Degrees with Muscle Strength 5/5 Spinal Forward Flexion 20 Degrees and Extension 10 degrees Thoracic Paraspinal Tenderness: T-1- T-2 on the  Left Side Lumbar Paraspinal Tenderness: L-3- L-5 on the Right Side Lower Extremities Full ROM and Muscle Strength 5 Arises from chair without difficulty/ slowly/ Uses straight cane for support Wide Based Gait    Neurological: She is alert and oriented to person, place, and time.  Skin: Skin is warm and dry.  Psychiatric: She has a normal mood and affect.          Assessment & Plan:  1.Low back pain with radiating symptoms in L3-4 distribution: continues to be limited by pain. Continue MS contin tid with MSIR at noon for breakthrough pain.  Refilled: MS contin 15 mg # 90 pills---use one pill every 8 hours  MSIR 15 mg # 30 pills--use one pill at  noon  Continue exercise regime.  2. Insomnia: Continue Trazodone  3. Right Shoulder Impingement: Still Improving after Steroid Injection. Continue exercise regime.   15 minutes of face to face patient care time was spent during this visit. All questions were encouraged and answered.   F/U in 1 month

## 2014-04-01 ENCOUNTER — Encounter: Payer: Self-pay | Admitting: Registered Nurse

## 2014-04-01 ENCOUNTER — Encounter: Payer: Medicare Other | Attending: Physical Medicine & Rehabilitation | Admitting: Registered Nurse

## 2014-04-01 VITALS — BP 157/78 | HR 66 | Resp 14 | Ht 66.0 in | Wt 208.0 lb

## 2014-04-01 DIAGNOSIS — Z5181 Encounter for therapeutic drug level monitoring: Secondary | ICD-10-CM

## 2014-04-01 DIAGNOSIS — H469 Unspecified optic neuritis: Secondary | ICD-10-CM

## 2014-04-01 DIAGNOSIS — M5414 Radiculopathy, thoracic region: Secondary | ICD-10-CM | POA: Diagnosis present

## 2014-04-01 DIAGNOSIS — Z79899 Other long term (current) drug therapy: Secondary | ICD-10-CM

## 2014-04-01 DIAGNOSIS — M961 Postlaminectomy syndrome, not elsewhere classified: Secondary | ICD-10-CM | POA: Diagnosis present

## 2014-04-01 DIAGNOSIS — M5417 Radiculopathy, lumbosacral region: Secondary | ICD-10-CM | POA: Insufficient documentation

## 2014-04-01 MED ORDER — MORPHINE SULFATE ER 15 MG PO TBCR: 15.0000 mg | EXTENDED_RELEASE_TABLET | Freq: Three times a day (TID) | ORAL | Status: DC

## 2014-04-01 MED ORDER — MORPHINE SULFATE 15 MG PO TABS: 15.0000 mg | ORAL_TABLET | Freq: Every day | ORAL | Status: DC

## 2014-04-01 NOTE — Progress Notes (Signed)
Subjective:    Patient ID: [name] [name], female    DOB: Jan 08, 1956, 58 y.o.   MRN: [id number] HPI: [name] is a 58 year old female who returns for follow up for chronic pain and medication refill.She says her pain is located in her lower back ( mainly right side) which radiates down her right leg. She has her back supporter on. She rates her pain 10. Her current exercise regime is  walking. She admits she hasn't been following her usual routine. She has been encouraged to increase her activity and to start chair exercising. She verbalizes understanding. She says she is looking for a second opinion and will be following up with a new neurologist. Her pain has increased in intensity and she is tired of hurting. Tearful, emotional support given, all questions answered.  Pain Inventory Average Pain 10 Pain Right Now 10 My pain is constant, sharp, stabbing, tingling and aching  In the last 24 hours, has pain interfered with the following? General activity 10 Relation with others 10 Enjoyment of life 10 What TIME of day is your pain at its worst? All Sleep (in general) Poor  Pain is worse with: walking, bending, sitting and inactivity Pain improves with: rest, heat/ice and medication Relief from Meds: 1  Mobility use a cane how many minutes can you walk? 5-10 minutes ability to climb steps?  yes do you drive?  yes  Function disabled: date disabled 10/2007  Neuro/Psych weakness trouble walking spasms  Prior Studies Any changes since last visit?  yes  Physicians involved in your care Any changes since last visit?  yes   Family History  Problem Relation Age of Onset  . Heart disease Mother   . Hypertension Mother    History   Social History  . Marital Status: Widowed    Spouse Name: N/A    Number of Children: N/A  . Years of Education: N/A   Social History Main Topics  . Smoking status: Former Smoker -- 0.50 packs/day for 20 years    Types:  Cigarettes    Quit date: 07/21/2008  . Smokeless tobacco: Never Used  . Alcohol Use: No  . Drug Use: No  . Sexual Activity: None   Other Topics Concern  . None   Social History Narrative  . None   Past Surgical History  Procedure Laterality Date  . Spine surgery    . Abdominal hysterectomy    . Colon surgery  05/11/11    [facility] Lysis of adhesions Crohn's   Past Medical History  Diagnosis Date  . Disorder of sacroiliac joint   . Lumbosacral neuritis   . Lumbosacral radiculitis   . Lumbar post-laminectomy syndrome   . Sciatica   . Lumbago   . Hypertension   . Crohn's disease    BP 157/78  Pulse 66  Resp 14  Ht 5\' 6"  (1.676 m)  Wt 208 lb (94.348 kg)  BMI 33.59 kg/m2  SpO2 99%  Opioid Risk Score:   Fall Risk Score: High Fall Risk (>13 points)   Review of Systems     Objective:   Physical Exam  Nursing note and vitals reviewed. Constitutional: She is oriented to person, place, and time. She appears well-developed and well-nourished.  HENT:  Head: Normocephalic and atraumatic.  Neck: Normal range of motion. Neck supple.  Cardiovascular: Normal rate and regular rhythm.   Pulmonary/Chest: Effort normal and breath sounds normal.  Musculoskeletal:  Normal Muscle Bulk and Muscle Testing Reveals: Upper  Extremities: Full ROM and Muscle Strength 5/5 Spinal Forward Flexion 35 Degrees and Extension 10 Degrees Lower Extremities: Full ROM and Muscle Strength 5/5 Right Leg Flexion Produces Pain into Lumbar Arises from chair with slight difficulty and assistance needed/ Using straight cane for support Antalgic Gait   Neurological: She is alert and oriented to person, place, and time.  Skin: Skin is warm and dry.  Psychiatric: She has a normal mood and affect.          Assessment & Plan:  1.Low back pain with radiating symptoms in L3-4 distribution: continues to be limited by pain. Continue MS contin tid with MSIR at noon for breakthrough pain.  Refilled: MS contin  15 mg # 90 pills---use one pill every 8 hours  MSIR 15 mg # 30 pills--use one pill at noon  Encouraged to Continue exercise regime.  2. Insomnia: Continue Trazodone  3. Right Shoulder Impingement: No complaints today. Continue exercise regime.  15 minutes of face to face patient care time was spent during this visit. All questions were encouraged and answered.  F/U in 1 month

## 2014-05-05 ENCOUNTER — Encounter: Payer: Medicare Other | Attending: Physical Medicine & Rehabilitation | Admitting: Registered Nurse

## 2014-05-05 ENCOUNTER — Encounter: Payer: Self-pay | Admitting: Registered Nurse

## 2014-05-05 ENCOUNTER — Other Ambulatory Visit: Payer: Self-pay | Admitting: Physical Medicine & Rehabilitation

## 2014-05-05 VITALS — BP 136/74 | HR 74 | Resp 14 | Ht 66.0 in | Wt 206.6 lb

## 2014-05-05 DIAGNOSIS — M961 Postlaminectomy syndrome, not elsewhere classified: Secondary | ICD-10-CM | POA: Diagnosis present

## 2014-05-05 DIAGNOSIS — G894 Chronic pain syndrome: Secondary | ICD-10-CM

## 2014-05-05 DIAGNOSIS — M5414 Radiculopathy, thoracic region: Secondary | ICD-10-CM | POA: Insufficient documentation

## 2014-05-05 DIAGNOSIS — M5417 Radiculopathy, lumbosacral region: Secondary | ICD-10-CM | POA: Diagnosis present

## 2014-05-05 DIAGNOSIS — Z79899 Other long term (current) drug therapy: Secondary | ICD-10-CM

## 2014-05-05 DIAGNOSIS — Z5181 Encounter for therapeutic drug level monitoring: Secondary | ICD-10-CM

## 2014-05-05 MED ORDER — MORPHINE SULFATE 15 MG PO TABS: 15.0000 mg | ORAL_TABLET | Freq: Every day | ORAL | Status: DC

## 2014-05-05 MED ORDER — MORPHINE SULFATE ER 15 MG PO TBCR: 15.0000 mg | EXTENDED_RELEASE_TABLET | Freq: Three times a day (TID) | ORAL | Status: DC

## 2014-05-05 NOTE — Progress Notes (Signed)
Subjective:    Patient ID: [name] [name], female    DOB: 01-02-1956, 58 y.o.   MRN: [id number]  HPI: [name] is a 58 year old female who returns for follow up for chronic pain and medication refill.She says her pain is located in her lower back ( mainly right side).  Back Brace intact. She rates her pain 10. Her current exercise regime is walking and performing stretching exercises.   Pain Inventory Average Pain 10 Pain Right Now 10 My pain is constant, sharp, stabbing and aching  In the last 24 hours, has pain interfered with the following? General activity 10 Relation with others 10 Enjoyment of life 10 What TIME of day is your pain at its worst? ALL Sleep (in general) Poor  Pain is worse with: walking, bending, sitting, inactivity and standing Pain improves with: rest and medication Relief from Meds: 2  Mobility use a cane how many minutes can you walk? 5  ability to climb steps?  yes do you drive?  yes  Function disabled: date disabled 10/2007  Neuro/Psych trouble walking spasms  Prior Studies Any changes since last visit?  no  Physicians involved in your care Any changes since last visit?  no   Family History  Problem Relation Age of Onset  . Heart disease Mother   . Hypertension Mother    History   Social History  . Marital Status: Widowed    Spouse Name: N/A    Number of Children: N/A  . Years of Education: N/A   Social History Main Topics  . Smoking status: Former Smoker -- 0.50 packs/day for 20 years    Types: Cigarettes    Quit date: 07/21/2008  . Smokeless tobacco: Never Used  . Alcohol Use: No  . Drug Use: No  . Sexual Activity: None   Other Topics Concern  . None   Social History Narrative   Past Surgical History  Procedure Laterality Date  . Spine surgery    . Abdominal hysterectomy    . Colon surgery  05/11/11    [facility] Lysis of adhesions Crohn's   Past Medical History  Diagnosis Date  . Disorder of  sacroiliac joint   . Lumbosacral neuritis   . Lumbosacral radiculitis   . Lumbar post-laminectomy syndrome   . Sciatica   . Lumbago   . Hypertension   . Crohn's disease    BP 136/74 mmHg  Pulse 74  Resp 14  Ht 5\' 6"  (1.676 m)  Wt 206 lb 9.6 oz (93.713 kg)  BMI 33.36 kg/m2  SpO2 98%  Opioid Risk Score:   Fall Risk Score: Low Fall Risk (0-5 points)  Review of Systems  Constitutional: Negative.   HENT: Negative.   Eyes: Negative.   Respiratory: Negative.   Cardiovascular: Negative.   Gastrointestinal: Negative.   Endocrine: Negative.   Genitourinary: Negative.   Musculoskeletal: Positive for myalgias and back pain.  Skin: Negative.   Allergic/Immunologic: Negative.   Neurological:       TROUBLE WALKING, SPASMS  Hematological: Negative.   Psychiatric/Behavioral: Negative.        Objective:   Physical Exam  Constitutional: She is oriented to person, place, and time. She appears well-developed and well-nourished.  HENT:  Head: Normocephalic and atraumatic.  Neck: Normal range of motion. Neck supple.  Cardiovascular: Normal rate and regular rhythm.   Pulmonary/Chest: Effort normal and breath sounds normal.  Musculoskeletal:  Normal Muscle Bulk and Muscle Testing Reveals: Upper Extremities: Full ROM and  Muscle Strength 5/5 Lumbar Paraspinal Tenderness: L-3- L-5 Lower Extremities: Full ROM and Muscle Strength 5/5 Right Lower Extremity Flexion Produces Pain into Lumbar Region Right Side. Arises from chair with ease using straight cane for support   Neurological: She is alert and oriented to person, place, and time.  Skin: Skin is warm and dry.  Psychiatric: She has a normal mood and affect.  Nursing note and vitals reviewed.         Assessment & Plan:  1.Low back pain with radiating symptoms in L3-4 distribution: continues to be limited by pain. Continue MS contin tid with MSIR at noon for breakthrough pain.  Refilled: MS Contin 15 mg # 90 pills---use one  pill every 8 hours  MSIR 15 mg # 30 pills--use one pill at noon  Encouraged to Continue exercise regime.  2. Insomnia: Continue Trazodone  3. Right Shoulder Impingement: No complaints today. Continue exercise regime.   20 minutes of face to face patient care time was spent during this visit. All questions were encouraged and answered.  F/U in 1 month

## 2014-05-06 LAB — PMP ALCOHOL METABOLITE (ETG): ETGU: NEGATIVE ng/mL

## 2014-05-08 LAB — OXYCODONE, URINE (LC/MS-MS)
Noroxycodone, Ur: NEGATIVE ng/mL (ref <50)
Oxycodone, ur: NEGATIVE ng/mL (ref <50)
Oxymorphone: 128 ng/mL — AB (ref <50)

## 2014-05-08 LAB — OPIATES/OPIOIDS (LC/MS-MS)
CODEINE URINE: NEGATIVE ng/mL (ref <50)
HYDROCODONE: NEGATIVE ng/mL (ref <50)
Hydromorphone: 141 ng/mL (ref <50)
Morphine Urine: 43264 ng/mL (ref <50)
NOROXYCODONE, UR: NEGATIVE ng/mL (ref <50)
Norhydrocodone, Ur: NEGATIVE ng/mL (ref <50)
OXYMORPHONE, URINE: 128 ng/mL — AB (ref <50)
Oxycodone, ur: NEGATIVE ng/mL (ref <50)

## 2014-05-09 LAB — PRESCRIPTION MONITORING PROFILE (SOLSTAS)
Amphetamine/Meth: NEGATIVE ng/mL
BUPRENORPHINE, URINE: NEGATIVE ng/mL
Barbiturate Screen, Urine: NEGATIVE ng/mL
Benzodiazepine Screen, Urine: NEGATIVE ng/mL
CANNABINOID SCRN UR: NEGATIVE ng/mL
COCAINE METABOLITES: NEGATIVE ng/mL
CREATININE, URINE: 218.56 mg/dL (ref >20.0)
Carisoprodol, Urine: NEGATIVE ng/mL
FENTANYL URINE: NEGATIVE ng/mL
MDMA URINE: NEGATIVE ng/mL
Meperidine, Ur: NEGATIVE ng/mL
Methadone Screen, Urine: NEGATIVE ng/mL
Nitrites, Initial: NEGATIVE ug/mL
PROPOXYPHENE: NEGATIVE ng/mL
TAPENTADOLUR: NEGATIVE ng/mL
Tramadol Scrn, Ur: NEGATIVE ng/mL
ZOLPIDEM, URINE: NEGATIVE ng/mL
pH, Initial: 6.2 pH (ref 4.5–8.9)

## 2014-06-02 ENCOUNTER — Encounter: Payer: Self-pay | Admitting: Registered Nurse

## 2014-06-02 ENCOUNTER — Encounter: Payer: Medicare Other | Attending: Physical Medicine & Rehabilitation | Admitting: Registered Nurse

## 2014-06-02 ENCOUNTER — Other Ambulatory Visit: Payer: Self-pay | Admitting: Physical Medicine & Rehabilitation

## 2014-06-02 VITALS — BP 114/72 | HR 65 | Resp 14 | Ht 66.0 in | Wt 209.0 lb

## 2014-06-02 DIAGNOSIS — G894 Chronic pain syndrome: Secondary | ICD-10-CM

## 2014-06-02 DIAGNOSIS — M5417 Radiculopathy, lumbosacral region: Secondary | ICD-10-CM | POA: Insufficient documentation

## 2014-06-02 DIAGNOSIS — M961 Postlaminectomy syndrome, not elsewhere classified: Secondary | ICD-10-CM | POA: Diagnosis present

## 2014-06-02 DIAGNOSIS — Z79899 Other long term (current) drug therapy: Secondary | ICD-10-CM

## 2014-06-02 DIAGNOSIS — M5414 Radiculopathy, thoracic region: Secondary | ICD-10-CM | POA: Insufficient documentation

## 2014-06-02 DIAGNOSIS — Z5181 Encounter for therapeutic drug level monitoring: Secondary | ICD-10-CM

## 2014-06-02 MED ORDER — MORPHINE SULFATE ER 15 MG PO TBCR: 15.0000 mg | EXTENDED_RELEASE_TABLET | Freq: Three times a day (TID) | ORAL | Status: DC

## 2014-06-02 MED ORDER — MORPHINE SULFATE 15 MG PO TABS
15.0000 mg | ORAL_TABLET | Freq: Every day | ORAL | Status: DC
Start: 2014-06-02 — End: 2014-07-01

## 2014-06-02 NOTE — Progress Notes (Signed)
Urine drug screen for this encounter is inconsistent.  There is oxymorphone present which is either evidence of taking opana or a metabolite of taking oxycodone. [name] to address

## 2014-06-02 NOTE — Progress Notes (Signed)
Subjective:    Patient ID: [name] [name], female    DOB: 27-Jan-1956, 58 y.o.   MRN: [id number]  HPI: [name] [name] is a 58 year old female who returns for follow up for chronic pain and medication refill.She says her pain is located in her lower back ( mainly right side). Back Brace intact. She rates her pain 10. Her current exercise regime is walking short distances and performing stretching exercises.  Her UDS from 05/05/2014, she admits she was in excruciating pain the beginning of November, she found percocet in her drawer. She admits she consumed them. Educated her on the Narcotic Contract, she is not allowed to have any narcotics from [facility]. If a prescriber tries to write her a prescription she has to call this office for clarification. She verbalizes understanding, also realizes this is a Warning and if this happens again she would be excuse from this office she verbalizes understanding.  Pain Inventory Average Pain 10 Pain Right Now 10 My pain is constant, sharp, burning and aching  In the last 24 hours, has pain interfered with the following? General activity 10 Relation with others 10 Enjoyment of life 10 What TIME of day is your pain at its worst? all Sleep (in general) Poor  Pain is worse with: walking, bending, inactivity and standing Pain improves with: rest, heat/ice and medication Relief from Meds: 2  Mobility walk with assistance use a cane how many minutes can you walk? 5 ability to climb steps?  yes do you drive?  yes use a wheelchair transfers alone Do you have any goals in this area?  yes  Function not employed: date last employed 08/2006 disabled: date disabled 10/2007 I need assistance with the following:  meal prep, household duties and shopping  Neuro/Psych trouble walking spasms  Prior Studies Any changes since last visit?  no  Physicians involved in your care Any changes since last visit?  no   Family History  Problem  Relation Age of Onset  . Heart disease Mother   . Hypertension Mother    History   Social History  . Marital Status: Widowed    Spouse Name: N/A    Number of Children: N/A  . Years of Education: N/A   Social History Main Topics  . Smoking status: Former Smoker -- 0.50 packs/day for 20 years    Types: Cigarettes    Quit date: 07/21/2008  . Smokeless tobacco: Never Used  . Alcohol Use: No  . Drug Use: No  . Sexual Activity: None   Other Topics Concern  . None   Social History Narrative   Past Surgical History  Procedure Laterality Date  . Spine surgery    . Abdominal hysterectomy    . Colon surgery  05/11/11    [facility] Lysis of adhesions Crohn's   Past Medical History  Diagnosis Date  . Disorder of sacroiliac joint   . Lumbosacral neuritis   . Lumbosacral radiculitis   . Lumbar post-laminectomy syndrome   . Sciatica   . Lumbago   . Hypertension   . Crohn's disease    BP 114/72 mmHg  Pulse 65  Resp 14  Ht 5\' 6"  (1.676 m)  Wt 209 lb (94.802 kg)  BMI 33.75 kg/m2  SpO2 95%  Opioid Risk Score:   Fall Risk Score: Moderate Fall Risk (6-13 points) (pt declined pamphlet) Review of Systems  Musculoskeletal: Positive for gait problem.  Neurological:       Spasms   All other  systems reviewed and are negative.      Objective:   Physical Exam  Constitutional: She is oriented to person, place, and time. She appears well-developed and well-nourished.  HENT:  Head: Normocephalic and atraumatic.  Neck: Normal range of motion. Neck supple.  Cardiovascular: Normal rate and regular rhythm.   Pulmonary/Chest: Effort normal and breath sounds normal.  Musculoskeletal:  Normal Muscle Bulk and Muscle Testing Reveals: Upper Extremities: Full ROM and Muscle Strength 5/5 Lumbar Paraspinal Tenderness: L-3- L-5 ( Right Side) Back Brace Intact Lower Extremities: Full ROM and Muscle Strength 5/5 Arises from chair with slight Difficulty Antalgic Gait  Neurological: She is  alert and oriented to person, place, and time.  Skin: Skin is warm and dry.  Psychiatric: She has a normal mood and affect.  Nursing note and vitals reviewed.         Assessment & Plan:  1.Low back pain with radiating symptoms in L3-4 distribution: continues to be limited by pain. Continue MS contin tid with MSIR at noon for breakthrough pain.  Refilled: MS Contin 15 mg # 90 pills---use one pill every 8 hours  MSIR 15 mg # 30 pills--use one pill at noon  Encouraged to Continue exercise regime.  2. Insomnia: Continue Trazodone  3. Right Shoulder Impingement: No complaints today. Continue exercise regime.   20 minutes of face to face patient care time was spent during this visit. All questions were encouraged and answered.  F/U in 1 month

## 2014-06-17 ENCOUNTER — Other Ambulatory Visit: Payer: Self-pay | Admitting: Physical Medicine & Rehabilitation

## 2014-06-22 ENCOUNTER — Other Ambulatory Visit: Payer: Self-pay | Admitting: Physical Medicine & Rehabilitation

## 2014-07-01 ENCOUNTER — Encounter: Payer: Self-pay | Admitting: Registered Nurse

## 2014-07-01 ENCOUNTER — Encounter: Payer: Commercial Managed Care - HMO | Attending: Physical Medicine & Rehabilitation | Admitting: Registered Nurse

## 2014-07-01 VITALS — BP 134/77 | HR 70 | Resp 14

## 2014-07-01 DIAGNOSIS — Z79899 Other long term (current) drug therapy: Secondary | ICD-10-CM

## 2014-07-01 DIAGNOSIS — M5417 Radiculopathy, lumbosacral region: Secondary | ICD-10-CM | POA: Diagnosis present

## 2014-07-01 DIAGNOSIS — M5414 Radiculopathy, thoracic region: Secondary | ICD-10-CM | POA: Diagnosis not present

## 2014-07-01 DIAGNOSIS — M961 Postlaminectomy syndrome, not elsewhere classified: Secondary | ICD-10-CM | POA: Insufficient documentation

## 2014-07-01 DIAGNOSIS — Z5181 Encounter for therapeutic drug level monitoring: Secondary | ICD-10-CM

## 2014-07-01 DIAGNOSIS — G894 Chronic pain syndrome: Secondary | ICD-10-CM

## 2014-07-01 MED ORDER — MORPHINE SULFATE ER 15 MG PO TBCR: 15.0000 mg | EXTENDED_RELEASE_TABLET | Freq: Three times a day (TID) | ORAL | Status: DC

## 2014-07-01 MED ORDER — MORPHINE SULFATE 15 MG PO TABS: 15.0000 mg | ORAL_TABLET | Freq: Every day | ORAL | Status: DC

## 2014-07-01 NOTE — Progress Notes (Signed)
Subjective:    Patient ID: [name] [name], female    DOB: 1955/10/06, 59 y.o.   MRN: [id number]  HPI: [name] [name] is a 59 year old female who returns for follow up for chronic pain and medication refill.She says her pain is located in her lower back ( mainly right side). Back Brace intact. She rates her pain 10. Her current exercise regime is walking short distances and performing stretching and chair exercises.   Pain Inventory Average Pain 10 Pain Right Now 10 My pain is constant, sharp, burning, stabbing and tingling  In the last 24 hours, has pain interfered with the following? General activity 10 Relation with others 10 Enjoyment of life 10 What TIME of day is your pain at its worst? all Sleep (in general) Poor  Pain is worse with: walking, bending, sitting, inactivity, standing and some activites Pain improves with: rest, heat/ice and medication Relief from Meds: 1  Mobility use a cane how many minutes can you walk? 10 ability to climb steps?  yes do you drive?  yes  Function disabled: date disabled 2009 I need assistance with the following:  meal prep, household duties and shopping  Neuro/Psych trouble walking spasms  Prior Studies Any changes since last visit?  no  Physicians involved in your care Any changes since last visit?  no   Family History  Problem Relation Age of Onset  . Heart disease Mother   . Hypertension Mother    History   Social History  . Marital Status: Widowed    Spouse Name: N/A    Number of Children: N/A  . Years of Education: N/A   Social History Main Topics  . Smoking status: Former Smoker -- 0.50 packs/day for 20 years    Types: Cigarettes    Quit date: 07/21/2008  . Smokeless tobacco: Never Used  . Alcohol Use: No  . Drug Use: No  . Sexual Activity: None   Other Topics Concern  . None   Social History Narrative   Past Surgical History  Procedure Laterality Date  . Spine surgery    . Abdominal  hysterectomy    . Colon surgery  05/11/11    [facility] Lysis of adhesions Crohn's   Past Medical History  Diagnosis Date  . Disorder of sacroiliac joint   . Lumbosacral neuritis   . Lumbosacral radiculitis   . Lumbar post-laminectomy syndrome   . Sciatica   . Lumbago   . Hypertension   . Crohn's disease    BP 134/77 P- 70 R- 14 o2Sat 98% Opioid Risk Score:   Fall Risk Score: Moderate Fall Risk (6-13 points)    Review of Systems  Constitutional: Positive for appetite change and unexpected weight change.  Musculoskeletal: Positive for gait problem.       Spasms  All other systems reviewed and are negative.      Objective:   Physical Exam  Constitutional: She is oriented to person, place, and time. She appears well-developed and well-nourished.  HENT:  Head: Normocephalic and atraumatic.  Neck: Normal range of motion. Neck supple.  Cardiovascular: Normal rate and regular rhythm.   Pulmonary/Chest: Effort normal and breath sounds normal.  Musculoskeletal:  Normal Muscle Bulk and Muscle Testing Reveals: Upper Extremities: Full ROM and Muscle Strength 5/5 Lumbar Paraspinal Tenderness: L-3-L-5 Lower Extremities: Full ROM and Muscle Strength 5/5 Bilateral Lower Extremities Flexion Produces Pain into Lumbar Arises from chair with ease Narrow Based Gait    Neurological: She is alert and  oriented to person, place, and time.  Skin: Skin is warm and dry.  Psychiatric: She has a normal mood and affect.  Nursing note and vitals reviewed.         Assessment & Plan:  1.Low back pain with radiating symptoms in L3-4 distribution: continues to be limited by pain. Continue MS contin tid with MSIR at noon for breakthrough pain.  Refilled: MS Contin 15 mg # 90 pills---use one pill every 8 hours  MSIR 15 mg # 30 pills--use one pill at noon  Encouraged to Continue exercise regime.  2. Insomnia: Continue Trazodone  3. Right Shoulder Impingement: No complaints today. Continue  exercise regime.   20 minutes of face to face patient care time was spent during this visit. All questions were encouraged and answered.  F/U in 1 month

## 2014-08-12 ENCOUNTER — Ambulatory Visit: Payer: Self-pay

## 2014-08-12 ENCOUNTER — Encounter: Payer: Self-pay | Admitting: Registered Nurse

## 2014-08-12 ENCOUNTER — Encounter: Payer: Medicare Other | Attending: Physical Medicine & Rehabilitation | Admitting: Registered Nurse

## 2014-08-12 VITALS — BP 146/78 | HR 92 | Resp 14

## 2014-08-12 DIAGNOSIS — M545 Low back pain, unspecified: Secondary | ICD-10-CM

## 2014-08-12 DIAGNOSIS — M5417 Radiculopathy, lumbosacral region: Secondary | ICD-10-CM

## 2014-08-12 DIAGNOSIS — G8929 Other chronic pain: Secondary | ICD-10-CM

## 2014-08-12 DIAGNOSIS — M961 Postlaminectomy syndrome, not elsewhere classified: Secondary | ICD-10-CM | POA: Diagnosis present

## 2014-08-12 DIAGNOSIS — Z79899 Other long term (current) drug therapy: Secondary | ICD-10-CM

## 2014-08-12 DIAGNOSIS — M7061 Trochanteric bursitis, right hip: Secondary | ICD-10-CM

## 2014-08-12 DIAGNOSIS — M5440 Lumbago with sciatica, unspecified side: Secondary | ICD-10-CM

## 2014-08-12 DIAGNOSIS — Z5181 Encounter for therapeutic drug level monitoring: Secondary | ICD-10-CM

## 2014-08-12 DIAGNOSIS — M5414 Radiculopathy, thoracic region: Secondary | ICD-10-CM

## 2014-08-12 MED ORDER — CYCLOBENZAPRINE HCL 10 MG PO TABS: 10.0000 mg | ORAL_TABLET | Freq: Three times a day (TID) | ORAL | Status: DC | PRN

## 2014-08-12 MED ORDER — MORPHINE SULFATE 15 MG PO TABS: 15.0000 mg | ORAL_TABLET | Freq: Every day | ORAL | Status: DC

## 2014-08-12 MED ORDER — METHYLPREDNISOLONE 4 MG PO KIT: PACK | ORAL | Status: DC

## 2014-08-12 MED ORDER — MORPHINE SULFATE ER 15 MG PO TBCR: 15.0000 mg | EXTENDED_RELEASE_TABLET | Freq: Three times a day (TID) | ORAL | Status: DC

## 2014-08-12 MED ORDER — TRAZODONE HCL 50 MG PO TABS: ORAL_TABLET | ORAL | Status: DC

## 2014-08-12 NOTE — Progress Notes (Signed)
Subjective:    Patient ID: [name] [name], female    DOB: April 21, 1956, 59 y.o.   MRN: [id number]  HPI: [name] is a 59 year old female who returns for follow up for chronic pain and medication refill.She says her pain is located in her lower back ( mainly right side). Back Brace intact. She rates her pain 10. She has not followed her usual exercise regime due to increase intensity of pain. She says in the last 6 weeks her pain has intensified especially after cleaning her stove. She states " I'm tired of hurting" denies depression and counseling. Tearful emotional support given. Will order X-ray she verbalizes understanding. On March 10th she has an appointment with Dr. Charleston Ropes for second opinion.  Pain Inventory Average Pain 10 Pain Right Now 10 My pain is constant, sharp, burning, tingling and aching  In the last 24 hours, has pain interfered with the following? General activity 10 Relation with others 10 Enjoyment of life 10 What TIME of day is your pain at its worst? all Sleep (in general) Poor  Pain is worse with: walking, bending, sitting, inactivity and standing Pain improves with: rest, heat/ice and medication Relief from Meds: 2  Mobility walk with assistance use a cane how many minutes can you walk? 5 ability to climb steps?  no do you drive?  yes  Function not employed: date last employed 08/2006 disabled: date disabled 10/2007 I need assistance with the following:  bathing, meal prep, household duties and shopping  Neuro/Psych weakness tingling trouble walking spasms  Prior Studies Any changes since last visit?  no  Physicians involved in your care Any changes since last visit?  no   Family History  Problem Relation Age of Onset  . Heart disease Mother   . Hypertension Mother    History   Social History  . Marital Status: Widowed    Spouse Name: N/A  . Number of Children: N/A  . Years of Education: N/A   Social  History Main Topics  . Smoking status: Former Smoker -- 0.50 packs/day for 20 years    Types: Cigarettes    Quit date: 07/21/2008  . Smokeless tobacco: Never Used  . Alcohol Use: No  . Drug Use: No  . Sexual Activity: Not on file   Other Topics Concern  . Not on file   Social History Narrative   Past Surgical History  Procedure Laterality Date  . Spine surgery    . Abdominal hysterectomy    . Colon surgery  05/11/11    [facility] Lysis of adhesions Crohn's   Past Medical History  Diagnosis Date  . Disorder of sacroiliac joint   . Lumbosacral neuritis   . Lumbosacral radiculitis   . Lumbar post-laminectomy syndrome   . Sciatica   . Lumbago   . Hypertension   . Crohn's disease    There were no vitals taken for this visit.  Opioid Risk Score:   Fall Risk Score:    Review of Systems  Constitutional:       Night sweats Weight gain   Musculoskeletal: Positive for gait problem.  Neurological: Positive for weakness.       Tingling Spasms   All other systems reviewed and are negative.      Objective:   Physical Exam  Constitutional: She is oriented to person, place, and time. She appears well-developed and well-nourished.  HENT:  Head: Normocephalic and atraumatic.  Neck: Normal range of motion. Neck supple.  Cardiovascular: Normal rate and regular rhythm.   Pulmonary/Chest: Effort normal and breath sounds normal.  Musculoskeletal:  Normal Muscle Bulk and Muscle Testing Reveals: Upper Extremities: Full ROM and Muscle Strength 5/5 Lumbar Paraspinal Tenderness: L-3- L-5 Mainly Right Side Right Greater Trochanteric Tenderness Lower Extremities: Left Full ROM and Muscle strength 5/5 Right: Decreased ROM and Muscle Strength 3/5  Right Lower Extremities Flexion Produces Pain into Right Hip and Lumbar Arises from chair with difficulty Antalgic Gait/ assisted into wheelchair Staff escorted to lobby via wheelchair  Neurological: She is alert and oriented to person,  place, and time.  Skin: Skin is warm and dry.  Psychiatric: She has a normal mood and affect.  Nursing note and vitals reviewed.         Assessment & Plan:  1.Low back pain with radiating symptoms in L3-4 distribution: continues to be limited by pain. Continue MS contin tid with MSIR at noon for breakthrough pain.  Refilled: MS Contin 15 mg # 90 pills---use one pill every 8 hours  MSIR 15 mg # 30 pills--use one pill at noon  Encouraged to Continue exercise regime.  2. Insomnia: Continue Trazodone  3. Right Shoulder Impingement: No complaints today. 4. Acute exacerbation of chronic low back pain: RX: Lumbar X-ray 5. Right Greater Trochanteric Tenderness: RX: Medrol Dose Pak  20 minutes of face to face patient care time was spent during this visit. All questions were encouraged and answered.   F/U in 1 month

## 2014-09-04 ENCOUNTER — Telehealth: Payer: Self-pay | Admitting: *Deleted

## 2014-09-04 NOTE — Telephone Encounter (Signed)
Called to let [name] [name] know that they have ordered an MRI on her for Monday and have ordered a valium for her.  Letting us know.

## 2014-09-06 ENCOUNTER — Other Ambulatory Visit: Payer: Self-pay | Admitting: Physical Medicine and Rehabilitation

## 2014-09-15 ENCOUNTER — Encounter: Payer: Self-pay | Admitting: Registered Nurse

## 2014-09-15 ENCOUNTER — Encounter: Payer: Medicare Other | Attending: Physical Medicine & Rehabilitation | Admitting: Registered Nurse

## 2014-09-15 ENCOUNTER — Other Ambulatory Visit: Payer: Self-pay | Admitting: Physical Medicine & Rehabilitation

## 2014-09-15 VITALS — BP 133/81 | HR 70 | Resp 16

## 2014-09-15 DIAGNOSIS — M5414 Radiculopathy, thoracic region: Secondary | ICD-10-CM | POA: Diagnosis present

## 2014-09-15 DIAGNOSIS — G894 Chronic pain syndrome: Secondary | ICD-10-CM

## 2014-09-15 DIAGNOSIS — M7062 Trochanteric bursitis, left hip: Secondary | ICD-10-CM

## 2014-09-15 DIAGNOSIS — M5417 Radiculopathy, lumbosacral region: Secondary | ICD-10-CM | POA: Insufficient documentation

## 2014-09-15 DIAGNOSIS — M7061 Trochanteric bursitis, right hip: Secondary | ICD-10-CM

## 2014-09-15 DIAGNOSIS — M961 Postlaminectomy syndrome, not elsewhere classified: Secondary | ICD-10-CM | POA: Insufficient documentation

## 2014-09-15 DIAGNOSIS — Z5181 Encounter for therapeutic drug level monitoring: Secondary | ICD-10-CM

## 2014-09-15 DIAGNOSIS — G8929 Other chronic pain: Secondary | ICD-10-CM

## 2014-09-15 DIAGNOSIS — Z79899 Other long term (current) drug therapy: Secondary | ICD-10-CM | POA: Diagnosis not present

## 2014-09-15 DIAGNOSIS — M545 Low back pain: Secondary | ICD-10-CM

## 2014-09-15 MED ORDER — MORPHINE SULFATE ER 15 MG PO TBCR: 15.0000 mg | EXTENDED_RELEASE_TABLET | Freq: Three times a day (TID) | ORAL | Status: DC

## 2014-09-15 MED ORDER — MORPHINE SULFATE 15 MG PO TABS: 15.0000 mg | ORAL_TABLET | Freq: Every day | ORAL | Status: DC

## 2014-09-15 NOTE — Progress Notes (Signed)
Subjective:    Patient ID: [name] [name], female    DOB: 1955/06/22, 59 y.o.   MRN: [id number]  HPI: [name] is a 59 year old female who returns for follow up for chronic pain and medication refill.She says her pain is located in her lower back. Back Brace intact. She rates her pain 10. Her current  exercise regime is performing stretching exercises upper extremities and using bands.  She had her appointment with  Dr. Gevena Mart. He sent her for a MRI she has a follow up appointment on 09/21/12. She sates she maybe having surgery. Will await Dr. Velna Ochs findings.  Pain Inventory Average Pain 10 Pain Right Now 10 My pain is constant, sharp, burning, stabbing, tingling and aching  In the last 24 hours, has pain interfered with the following? General activity 10 Relation with others 10 Enjoyment of life 10 What TIME of day is your pain at its worst? all Sleep (in general) Poor  Pain is worse with: walking, bending, sitting, inactivity, standing and some activites Pain improves with: rest, heat/ice and medication Relief from Meds: 1  Mobility walk with assistance use a cane ability to climb steps?  yes use a wheelchair  Function disabled: date disabled . I need assistance with the following:  bathing, meal prep, household duties and shopping  Neuro/Psych trouble walking spasms  Prior Studies Any changes since last visit?  no  Physicians involved in your care Any changes since last visit?  no   Family History  Problem Relation Age of Onset  . Heart disease Mother   . Hypertension Mother    History   Social History  . Marital Status: Widowed    Spouse Name: N/A  . Number of Children: N/A  . Years of Education: N/A   Social History Main Topics  . Smoking status: Former Smoker -- 0.50 packs/day for 20 years    Types: Cigarettes    Quit date: 07/21/2008  . Smokeless tobacco: Never Used  . Alcohol Use: No  . Drug Use: No  . Sexual  Activity: Not on file   Other Topics Concern  . None   Social History Narrative   Past Surgical History  Procedure Laterality Date  . Spine surgery    . Abdominal hysterectomy    . Colon surgery  05/11/11    [facility] Lysis of adhesions Crohn's   Past Medical History  Diagnosis Date  . Disorder of sacroiliac joint   . Lumbosacral neuritis   . Lumbosacral radiculitis   . Lumbar post-laminectomy syndrome   . Sciatica   . Lumbago   . Hypertension   . Crohn's disease    There were no vitals taken for this visit.  Opioid Risk Score:   Fall Risk Score: Moderate Fall Risk (6-13 points)`1  Depression screen PHQ 2/9  Depression screen PHQ 2/9 09/15/2014  Decreased Interest 3  Down, Depressed, Hopeless 0  PHQ - 2 Score 3  Altered sleeping 2  Tired, decreased energy 3  Change in appetite 3  Feeling bad or failure about yourself  1  Trouble concentrating 0  Moving slowly or fidgety/restless 0  Suicidal thoughts 0  PHQ-9 Score 12     Review of Systems  Constitutional:       Night sweats Weight gain Poor appetite  Gastrointestinal: Positive for nausea.  Musculoskeletal: Positive for gait problem.  Neurological:       Spasms  All other systems reviewed and are negative.  Objective:   Physical Exam  Constitutional: She is oriented to person, place, and time. She appears well-developed and well-nourished.  HENT:  Head: Normocephalic and atraumatic.  Neck: Normal range of motion. Neck supple.  Cardiovascular: Normal rate and regular rhythm.   Pulmonary/Chest: Effort normal and breath sounds normal.  Musculoskeletal:  Normal Muscle Bulk and Muscle Testing Reveals: Upper Extremities: Full ROM and Muscle Strength 5/5 Lumbar Hypersensitivity/ Wearing Back Brace/ Facial Grimacing Noted Bilateral Greater Trochanteric Tenderness Lower Extremities: Left: Full ROM and Muscle strength 5/5 Right Lower extremity: Decreased ROM Right Lower Extremity Flexion Produces Pain  into Lumbar and Posteriorly in Lower Extremity Arises from chair with difficulty/ Antalgic Gait. Transfer to wheelchair and assisted to lobby via wheelchair. Antalgic gait  Neurological: She is alert and oriented to person, place, and time.  Skin: Skin is warm and dry.  Psychiatric: She has a normal mood and affect.  Nursing note and vitals reviewed.         Assessment & Plan:  1.Low back pain with radiating symptoms in L3-4 distribution: continues to be limited by pain. Continue MS contin tid with MSIR at noon for breakthrough pain.  Refilled: MS Contin 15 mg # 90 pills---use one pill every 8 hours  MSIR 15 mg # 30 pills--use one pill at noon  Encouraged to Continue exercise regime.  2. Insomnia: Continue Trazodone  3. Acute exacerbation of chronic low back pain:  Dr. Velna Ochs Following 5. Bilateral Greater Trochanteric Tenderness: Continue with voltaren gel. No prednisone at this time. Possibly a surgery candidate   20 minutes of face to face patient care time was spent during this visit. All questions were encouraged and answered.   F/U in 1 month

## 2014-09-16 LAB — PMP ALCOHOL METABOLITE (ETG): ETGU: NEGATIVE ng/mL

## 2014-09-18 ENCOUNTER — Other Ambulatory Visit: Payer: Self-pay | Admitting: Registered Nurse

## 2014-09-18 LAB — PRESCRIPTION MONITORING PROFILE (SOLSTAS)
AMPHETAMINE/METH: NEGATIVE ng/mL
Barbiturate Screen, Urine: NEGATIVE ng/mL
Buprenorphine, Urine: NEGATIVE ng/mL
CANNABINOID SCRN UR: NEGATIVE ng/mL
CARISOPRODOL, URINE: NEGATIVE ng/mL
Cocaine Metabolites: NEGATIVE ng/mL
Creatinine, Urine: 133.35 mg/dL (ref >20.0)
Fentanyl, Ur: NEGATIVE ng/mL
MDMA URINE: NEGATIVE ng/mL
METHADONE SCREEN, URINE: NEGATIVE ng/mL
Meperidine, Ur: NEGATIVE ng/mL
NITRITES URINE, INITIAL: NEGATIVE ug/mL
OXYCODONE SCRN UR: NEGATIVE ng/mL
Propoxyphene: NEGATIVE ng/mL
Tapentadol, urine: NEGATIVE ng/mL
Tramadol Scrn, Ur: NEGATIVE ng/mL
Zolpidem, Urine: NEGATIVE ng/mL
pH, Initial: 5.8 pH (ref 4.5–8.9)

## 2014-09-18 LAB — BENZODIAZEPINES (GC/LC/MS), URINE
Alprazolam metabolite (GC/LC/MS), ur confirm: NEGATIVE ng/mL (ref <25)
Clonazepam metabolite (GC/LC/MS), ur confirm: NEGATIVE ng/mL (ref <25)
Flurazepam metabolite (GC/LC/MS), ur confirm: NEGATIVE ng/mL (ref <50)
Lorazepam (GC/LC/MS), ur confirm: NEGATIVE ng/mL (ref <50)
MIDAZOLAMU: NEGATIVE ng/mL (ref <50)
Nordiazepam (GC/LC/MS), ur confirm: NEGATIVE ng/mL — AB (ref <50)
Oxazepam (GC/LC/MS), ur confirm: 102 ng/mL (ref <50)
TEMAZEPAMU: 163 ng/mL (ref <50)
Triazolam metabolite (GC/LC/MS), ur confirm: NEGATIVE ng/mL (ref <50)

## 2014-09-18 LAB — OPIATES/OPIOIDS (LC/MS-MS)
Codeine Urine: NEGATIVE ng/mL (ref <50)
HYDROCODONE: NEGATIVE ng/mL (ref <50)
HYDROMORPHONE: 200 ng/mL (ref <50)
Morphine Urine: 24817 ng/mL (ref <50)
NOROXYCODONE, UR: NEGATIVE ng/mL (ref <50)
Norhydrocodone, Ur: NEGATIVE ng/mL (ref <50)
Oxycodone, ur: NEGATIVE ng/mL (ref <50)
Oxymorphone: NEGATIVE ng/mL (ref <50)

## 2014-09-25 ENCOUNTER — Telehealth: Payer: Self-pay | Admitting: *Deleted

## 2014-09-25 NOTE — Telephone Encounter (Signed)
Attempted to return Ms. Macht call, no answer. Left message to return the call

## 2014-09-25 NOTE — Telephone Encounter (Addendum)
Patient called and left message asking for [name] to return call.  Did not state why she needed [place] to contact her

## 2014-09-26 NOTE — Telephone Encounter (Signed)
I spoke to [name] [name] she wanted to informed me she will be having  Surgery on May 6th.  Per DrMarland Kitchen Elmer Sow Derian clinical note she will be having. Bilateral Lumbar decompression  Stabilization spinal fusion, L2- sacrum, metal removal/ fusion exploration/ re-exploration, decompression, L-3- L-4-5, right possible left posterior iliac crest bone graft with or without bone graft extender, to attempt to improve symptoms.

## 2014-10-02 NOTE — Progress Notes (Signed)
Urine drug screen for this encounter is consistent for prescribed medication 

## 2014-10-16 ENCOUNTER — Ambulatory Visit: Payer: Medicare Other | Admitting: Registered Nurse

## 2014-10-20 ENCOUNTER — Encounter: Payer: Self-pay | Admitting: Registered Nurse

## 2014-10-20 ENCOUNTER — Encounter: Payer: Medicare Other | Attending: Physical Medicine & Rehabilitation | Admitting: Registered Nurse

## 2014-10-20 VITALS — BP 140/88 | HR 86 | Resp 14

## 2014-10-20 DIAGNOSIS — G894 Chronic pain syndrome: Secondary | ICD-10-CM | POA: Diagnosis not present

## 2014-10-20 DIAGNOSIS — Z79899 Other long term (current) drug therapy: Secondary | ICD-10-CM

## 2014-10-20 DIAGNOSIS — M5417 Radiculopathy, lumbosacral region: Secondary | ICD-10-CM | POA: Diagnosis present

## 2014-10-20 DIAGNOSIS — M5414 Radiculopathy, thoracic region: Secondary | ICD-10-CM | POA: Insufficient documentation

## 2014-10-20 DIAGNOSIS — M961 Postlaminectomy syndrome, not elsewhere classified: Secondary | ICD-10-CM | POA: Diagnosis present

## 2014-10-20 DIAGNOSIS — M545 Low back pain: Secondary | ICD-10-CM

## 2014-10-20 DIAGNOSIS — Z5181 Encounter for therapeutic drug level monitoring: Secondary | ICD-10-CM

## 2014-10-20 DIAGNOSIS — G8929 Other chronic pain: Secondary | ICD-10-CM

## 2014-10-20 MED ORDER — MORPHINE SULFATE ER 15 MG PO TBCR: 15.0000 mg | EXTENDED_RELEASE_TABLET | Freq: Three times a day (TID) | ORAL | Status: DC

## 2014-10-20 MED ORDER — MORPHINE SULFATE 15 MG PO TABS: 15.0000 mg | ORAL_TABLET | Freq: Every day | ORAL | Status: DC

## 2014-10-20 NOTE — Progress Notes (Signed)
Subjective:    Patient ID: [name] [name], female    DOB: 03-22-1956, 59 y.o.   MRN: [id number]  HPI: [name] is a 59 year old female who returns for follow up for chronic pain and medication refill.She says her pain is located in her mid- lower back radiating into right leg and left buttock. She rates her pain 10. She's not following her current exercise regime due to increase intensity of pain. She is very tearful with facial grimacing with assessment.  She's schedule for surgery June 3rd, 2016 withy Dr. Elmer Sow Derian.   Pain Inventory Average Pain 10 Pain Right Now 10 My pain is constant, sharp, burning, stabbing, tingling and aching  In the last 24 hours, has pain interfered with the following? General activity 10 Relation with others 10 Enjoyment of life 10 What TIME of day is your pain at its worst? ALL Sleep (in general) Poor  Pain is worse with: walking, bending, sitting, inactivity, standing and some activites Pain improves with: rest, heat/ice, therapy/exercise and medication Relief from Meds: 1  Mobility walk with assistance use a cane how many minutes can you walk? 0 ability to climb steps?  no do you drive?  yes  Function disabled: date disabled .  Neuro/Psych weakness tingling trouble walking spasms  Prior Studies Any changes since last visit?  no  Physicians involved in your care Any changes since last visit?  no   Family History  Problem Relation Age of Onset  . Heart disease Mother   . Hypertension Mother    History   Social History  . Marital Status: Widowed    Spouse Name: N/A  . Number of Children: N/A  . Years of Education: N/A   Social History Main Topics  . Smoking status: Former Smoker -- 0.50 packs/day for 20 years    Types: Cigarettes    Quit date: 07/21/2008  . Smokeless tobacco: Never Used  . Alcohol Use: No  . Drug Use: No  . Sexual Activity: Not on file   Other Topics Concern  . None   Social  History Narrative   Past Surgical History  Procedure Laterality Date  . Spine surgery    . Abdominal hysterectomy    . Colon surgery  05/11/11    [facility] Lysis of adhesions Crohn's   Past Medical History  Diagnosis Date  . Disorder of sacroiliac joint   . Lumbosacral neuritis   . Lumbosacral radiculitis   . Lumbar post-laminectomy syndrome   . Sciatica   . Lumbago   . Hypertension   . Crohn's disease    BP 140/88 mmHg  Pulse 86  Resp 14  SpO2 92%  Opioid Risk Score:   Fall Risk Score: Moderate Fall Risk (6-13 points)`1  Depression screen PHQ 2/9  Depression screen PHQ 2/9 09/15/2014  Decreased Interest 3  Down, Depressed, Hopeless 0  PHQ - 2 Score 3  Altered sleeping 2  Tired, decreased energy 3  Change in appetite 3  Feeling bad or failure about yourself  1  Trouble concentrating 0  Moving slowly or fidgety/restless 0  Suicidal thoughts 0  PHQ-9 Score 12      Review of Systems  Constitutional: Positive for unexpected weight change.  HENT: Negative.   Eyes: Negative.   Respiratory: Negative.   Cardiovascular: Negative.   Gastrointestinal: Negative.   Endocrine: Negative.   Genitourinary: Negative.   Musculoskeletal: Positive for myalgias, back pain and arthralgias.  Skin: Negative.   Allergic/Immunologic:  Negative.   Neurological: Positive for weakness.       Tingling, trouble walking, spasms  Hematological: Negative.   Psychiatric/Behavioral: Negative.        Objective:   Physical Exam  Constitutional: She is oriented to person, place, and time. She appears well-developed and well-nourished.  HENT:  Head: Normocephalic and atraumatic.  Neck: Normal range of motion. Neck supple.  Cardiovascular: Normal rate and regular rhythm.   Pulmonary/Chest: Effort normal and breath sounds normal.  Musculoskeletal:  Normal Muscle Bulk and Muscle Testing Reveals: Upper Extremities: Full ROM and Muscle Strength 5/5 Thoracic and Lumbar Hypersensitivity Lower  Extremities: Right: Decreased ROM and Muscle Strength Left Lower Extremity Flexion Produces Pain into Left Foot Arrived in wheelchair   Neurological: She is alert and oriented to person, place, and time.  Skin: Skin is warm and dry.  Psychiatric: She has a normal mood and affect.  Nursing note and vitals reviewed.         Assessment & Plan:  1.Low back pain with radiating symptoms in L3-4 distribution: continues to be limited by pain. Continue MS contin tid with MSIR at noon for breakthrough pain.  Refilled: MS Contin 15 mg # 90 pills---use one pill every 8 hours  MSIR 15 mg # 30 pills--use one pill at noon  Encouraged to Continue exercise regime.  2. Insomnia: Continue Trazodone  3. Acute exacerbation of chronic low back pain:  Dr. Velna Ochs Following 5. Bilateral Greater Trochanteric Tenderness: Continue with voltaren gel. No prednisone at this time. Possibly a surgery candidate   20 minutes of face to face patient care time was spent during this visit. All questions were encouraged and answered.   F/U in 1 month

## 2014-10-23 ENCOUNTER — Telehealth: Payer: Self-pay | Admitting: *Deleted

## 2014-10-23 NOTE — Telephone Encounter (Signed)
Patient called and asked if we could just write a note to Dr. Talbert Nan Nurse that she is currently in [name] [name] and under Dr. Letta Pate care.  Please fax on letterhead to [phone]. Fax confirmation was recd.

## 2014-10-30 ENCOUNTER — Telehealth: Payer: Self-pay | Admitting: *Deleted

## 2014-10-30 NOTE — Telephone Encounter (Signed)
Pt called stating that a note stating that she is a pt of our clinic was supposed to be sent to her surgeon. She says their office still has not rec'd the note. E have confirmation that the fax was sent successfully, however just in case, we sent it again

## 2014-11-07 ENCOUNTER — Ambulatory Visit: Payer: Medicare Other | Admitting: Physical Medicine & Rehabilitation

## 2014-11-10 ENCOUNTER — Other Ambulatory Visit: Payer: Self-pay | Admitting: Registered Nurse

## 2014-11-13 DIAGNOSIS — G629 Polyneuropathy, unspecified: Secondary | ICD-10-CM | POA: Insufficient documentation

## 2014-11-13 DIAGNOSIS — E876 Hypokalemia: Secondary | ICD-10-CM | POA: Insufficient documentation

## 2014-11-13 DIAGNOSIS — F5101 Primary insomnia: Secondary | ICD-10-CM | POA: Insufficient documentation

## 2014-11-18 ENCOUNTER — Other Ambulatory Visit: Payer: Self-pay | Admitting: Physical Medicine & Rehabilitation

## 2014-11-19 ENCOUNTER — Encounter: Payer: Self-pay | Admitting: Registered Nurse

## 2014-11-19 ENCOUNTER — Encounter: Payer: Medicare Other | Attending: Physical Medicine & Rehabilitation | Admitting: Registered Nurse

## 2014-11-19 VITALS — BP 114/69 | HR 65 | Resp 14

## 2014-11-19 DIAGNOSIS — G894 Chronic pain syndrome: Secondary | ICD-10-CM | POA: Diagnosis not present

## 2014-11-19 DIAGNOSIS — Z5181 Encounter for therapeutic drug level monitoring: Secondary | ICD-10-CM

## 2014-11-19 DIAGNOSIS — M5417 Radiculopathy, lumbosacral region: Secondary | ICD-10-CM | POA: Insufficient documentation

## 2014-11-19 DIAGNOSIS — M5414 Radiculopathy, thoracic region: Secondary | ICD-10-CM | POA: Diagnosis present

## 2014-11-19 DIAGNOSIS — M7062 Trochanteric bursitis, left hip: Secondary | ICD-10-CM | POA: Diagnosis not present

## 2014-11-19 DIAGNOSIS — Z79899 Other long term (current) drug therapy: Secondary | ICD-10-CM

## 2014-11-19 DIAGNOSIS — M961 Postlaminectomy syndrome, not elsewhere classified: Secondary | ICD-10-CM

## 2014-11-19 DIAGNOSIS — M7061 Trochanteric bursitis, right hip: Secondary | ICD-10-CM

## 2014-11-19 MED ORDER — MORPHINE SULFATE ER 15 MG PO TBCR: 15.0000 mg | EXTENDED_RELEASE_TABLET | Freq: Three times a day (TID) | ORAL | Status: DC

## 2014-11-19 MED ORDER — MORPHINE SULFATE 15 MG PO TABS: 15.0000 mg | ORAL_TABLET | Freq: Every day | ORAL | Status: DC

## 2014-11-19 NOTE — Progress Notes (Addendum)
Subjective:    Patient ID: [name] [name], female    DOB: 1955-10-08, 59 y.o.   MRN: [id number]  HPI: Ms. Teddi Badalamenti is a 59 year old female who returns for follow up for chronic pain and medication refill.She says her pain is located in her lower back radiating into bilateral hips and right leg. She rates her pain 10. She's not following her current exercise regime due to increase intensity of pain.   She's schedule for surgery June 3rd, 2016 withy Dr. Elmer Sow Derian.   Pain Inventory Average Pain 10 Pain Right Now 10 My pain is constant, sharp, burning, stabbing, tingling and aching  In the last 24 hours, has pain interfered with the following? General activity 10 Relation with others 10 Enjoyment of life 10 What TIME of day is your pain at its worst? all Sleep (in general) Poor  Pain is worse with: walking, bending, sitting, inactivity, standing and some activites Pain improves with: heat/ice and medication Relief from Meds: 1  Mobility walk with assistance use a cane use a walker how many minutes can you walk? 0 ability to climb steps?  no do you drive?  yes use a wheelchair Do you have any goals in this area?  yes  Function disabled: date disabled . I need assistance with the following:  dressing, bathing, meal prep and household duties Do you have any goals in this area?  yes  Neuro/Psych trouble walking spasms  Prior Studies Any changes since last visit?  no  Physicians involved in your care Any changes since last visit?  no   Family History  Problem Relation Age of Onset  . Heart disease Mother   . Hypertension Mother    History   Social History  . Marital Status: Widowed    Spouse Name: N/A  . Number of Children: N/A  . Years of Education: N/A   Social History Main Topics  . Smoking status: Former Smoker -- 0.50 packs/day for 20 years    Types: Cigarettes    Quit date: 07/21/2008  . Smokeless tobacco: Never Used  . Alcohol  Use: No  . Drug Use: No  . Sexual Activity: Not on file   Other Topics Concern  . None   Social History Narrative   Past Surgical History  Procedure Laterality Date  . Spine surgery    . Abdominal hysterectomy    . Colon surgery  05/11/11    [facility] Lysis of adhesions Crohn's   Past Medical History  Diagnosis Date  . Disorder of sacroiliac joint   . Lumbosacral neuritis   . Lumbosacral radiculitis   . Lumbar post-laminectomy syndrome   . Sciatica   . Lumbago   . Hypertension   . Crohn's disease    BP 114/69 mmHg  Pulse 65  Resp 14  SpO2 96%  Opioid Risk Score:   Fall Risk Score: Moderate Fall Risk (6-13 points) (patient declined educational material)`1  Depression screen PHQ 2/9  Depression screen PHQ 2/9 09/15/2014  Decreased Interest 3  Down, Depressed, Hopeless 0  PHQ - 2 Score 3  Altered sleeping 2  Tired, decreased energy 3  Change in appetite 3  Feeling bad or failure about yourself  1  Trouble concentrating 0  Moving slowly or fidgety/restless 0  Suicidal thoughts 0  PHQ-9 Score 12     Review of Systems  Constitutional:       Weight gain  Musculoskeletal: Positive for gait problem.  Neurological:  Spasms  All other systems reviewed and are negative.      Objective:   Physical Exam  Constitutional: She is oriented to person, place, and time. She appears well-developed and well-nourished.  HENT:  Head: Normocephalic and atraumatic.  Neck: Normal range of motion. Neck supple.  Cardiovascular: Normal rate and regular rhythm.   Pulmonary/Chest: Effort normal and breath sounds normal.  Musculoskeletal:  Normal Muscle Bulk and Muscle Testing Reveals: Upper Extremities: Full ROM and Muscle Strength 5/5 Thoracic and Lumbar Hypersensitivity Bilateral Greater Trochanteric Tenderness Lower Extremities: Left: Full ROM and Muscle Strength 5/5 Left Lower Extremity Flexion Produces pain into Hip Right: Decreased ROM and Flexion Produces pain into  Hip, Lumbar and entire extremity Arrived in wheelchair Facial Grimacing with assessment      Neurological: She is alert and oriented to person, place, and time.  Skin: Skin is warm and dry.  Psychiatric: She has a normal mood and affect.  Nursing note and vitals reviewed.         Assessment & Plan:  1.Low back pain with radiating symptoms in L3-4 distribution: continues to be limited by pain. Continue MS contin tid with MSIR at noon for breakthrough pain.  Refilled: MS Contin 15 mg # 90 pills---use one pill every 8 hours  MSIR 15 mg # 30 pills--use one pill at noon  Second script given to accommodate appointment. Encouraged to Continue exercise regime.  2. Insomnia: Continue Trazodone  3. Bilateral Greater Trochanteric Tenderness: Continue with voltaren gel.   20 minutes of face to face patient care time was spent during this visit. All questions were encouraged and answered.   F/U in 1 month

## 2014-11-28 DIAGNOSIS — Z981 Arthrodesis status: Secondary | ICD-10-CM | POA: Insufficient documentation

## 2014-11-28 DIAGNOSIS — M5136 Other intervertebral disc degeneration, lumbar region: Secondary | ICD-10-CM | POA: Insufficient documentation

## 2014-11-28 DIAGNOSIS — M4316 Spondylolisthesis, lumbar region: Secondary | ICD-10-CM | POA: Insufficient documentation

## 2014-12-08 ENCOUNTER — Telehealth: Payer: Self-pay | Admitting: *Deleted

## 2014-12-08 DIAGNOSIS — M5417 Radiculopathy, lumbosacral region: Principal | ICD-10-CM

## 2014-12-08 DIAGNOSIS — M5414 Radiculopathy, thoracic region: Secondary | ICD-10-CM

## 2014-12-08 DIAGNOSIS — M961 Postlaminectomy syndrome, not elsewhere classified: Secondary | ICD-10-CM

## 2014-12-08 MED ORDER — MORPHINE SULFATE 15 MG PO TABS: 15.0000 mg | ORAL_TABLET | Freq: Four times a day (QID) | ORAL | Status: DC | PRN

## 2014-12-08 MED ORDER — MORPHINE SULFATE ER 15 MG PO TBCR: 15.0000 mg | EXTENDED_RELEASE_TABLET | Freq: Three times a day (TID) | ORAL | Status: DC

## 2014-12-08 NOTE — Telephone Encounter (Signed)
Spoke with [name] [name] she was discharged from [facility] on 12/03/2014. EPIC Reviewed. Also states she has been taking her MS Contin 15 mg four times a day and MSIR twice a day. I spoke with Dr. Letta Pate: Her MS Contin 15 mg will remain three times a day and Her MSIR will be increase to four times a day. She is aware this increase will be for a month or two. She verbalizes understanding. She will call office to let us know who will pick up her prescription.  Her next scheduled appointment is with Dr. Letta Pate on July 12th.

## 2014-12-08 NOTE — Telephone Encounter (Signed)
[name] [name] called, had back surgery, surgeon did not prescribe pain medication because she is under contract.  Pt is in painm would like you to call her back ASAP

## 2014-12-30 ENCOUNTER — Encounter: Payer: Medicare Other | Attending: Physical Medicine & Rehabilitation

## 2014-12-30 ENCOUNTER — Ambulatory Visit (HOSPITAL_BASED_OUTPATIENT_CLINIC_OR_DEPARTMENT_OTHER): Payer: Medicare Other | Admitting: Physical Medicine & Rehabilitation

## 2014-12-30 ENCOUNTER — Other Ambulatory Visit: Payer: Self-pay | Admitting: Registered Nurse

## 2014-12-30 ENCOUNTER — Other Ambulatory Visit: Payer: Self-pay | Admitting: Physical Medicine & Rehabilitation

## 2014-12-30 ENCOUNTER — Encounter: Payer: Self-pay | Admitting: Physical Medicine & Rehabilitation

## 2014-12-30 VITALS — BP 114/76 | HR 69 | Resp 14

## 2014-12-30 DIAGNOSIS — Z5181 Encounter for therapeutic drug level monitoring: Secondary | ICD-10-CM

## 2014-12-30 DIAGNOSIS — G894 Chronic pain syndrome: Secondary | ICD-10-CM | POA: Diagnosis not present

## 2014-12-30 DIAGNOSIS — M5417 Radiculopathy, lumbosacral region: Secondary | ICD-10-CM | POA: Diagnosis present

## 2014-12-30 DIAGNOSIS — M5414 Radiculopathy, thoracic region: Secondary | ICD-10-CM

## 2014-12-30 DIAGNOSIS — Z79899 Other long term (current) drug therapy: Secondary | ICD-10-CM

## 2014-12-30 DIAGNOSIS — M961 Postlaminectomy syndrome, not elsewhere classified: Secondary | ICD-10-CM | POA: Insufficient documentation

## 2014-12-30 MED ORDER — MORPHINE SULFATE 15 MG PO TABS: 15.0000 mg | ORAL_TABLET | Freq: Four times a day (QID) | ORAL | Status: DC | PRN

## 2014-12-30 MED ORDER — MORPHINE SULFATE ER 15 MG PO TBCR: 15.0000 mg | EXTENDED_RELEASE_TABLET | Freq: Three times a day (TID) | ORAL | Status: DC

## 2014-12-30 NOTE — Patient Instructions (Signed)
As you recover from your surgery we should be able to reduce the morphine dose somewhat.

## 2014-12-30 NOTE — Progress Notes (Signed)
Subjective:    Patient ID: [name] [name], female    DOB: 12-16-1955, 59 y.o.   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  Post op RLE pain improved  Had post op low K which improved  Bending or twisting Restricted per Surgeon post op, Using postoperative lumbar orthosis Daughter assisting with dressing and bathing  HHPT, Ongoing as well as home health RN Pain Inventory Average Pain 7 Pain Right Now 7 My pain is burning and stabbing  In the last 24 hours, has pain interfered with the following? General activity 7 Relation with others 7 Enjoyment of life 7 What TIME of day is your pain at its worst? all Sleep (in general) Fair  Pain is worse with: inactivity Pain improves with: heat/ice and medication Relief from Meds: 7  Mobility walk with assistance use a cane use a walker Do you have any goals in this area?  yes  Function disabled: date disabled . I need assistance with the following:  dressing, bathing, meal prep, household duties and shopping Do you have any goals in this area?  yes  Neuro/Psych numbness trouble walking  Prior Studies Any changes since last visit?  no  Physicians involved in your care Any changes since last  visit?  no   Family History  Problem Relation Age of Onset  . Heart disease Mother   . Hypertension Mother    History   Social History  . Marital Status: Widowed    Spouse Name: N/A  . Number of Children: N/A  . Years of Education: N/A   Social History Main Topics  . Smoking status: Former Smoker -- 0.50 packs/day for 20 years    Types: Cigarettes    Quit date: 07/21/2008  . Smokeless tobacco: Never Used  . Alcohol Use: No  . Drug Use: No  . Sexual Activity: Not on file   Other Topics Concern  . None   Social History Narrative   Past Surgical History  Procedure Laterality Date  . Spine surgery    . Abdominal hysterectomy    . Colon surgery  05/11/11    Duke Lysis of adhesions Crohn's   Past Medical History  Diagnosis Date  . Disorder of sacroiliac joint   . Lumbosacral neuritis   . Lumbosacral radiculitis   . Lumbar post-laminectomy syndrome   . Sciatica   . Lumbago   . Hypertension   . Crohn's disease    BP 114/76 mmHg  Pulse 69  Resp 14  SpO2 97%  Opioid Risk Score:   Fall Risk Score: Moderate Fall Risk (6-13 points)`1  Depression screen PHQ 2/9  Depression screen PHQ 2/9 09/15/2014  Decreased Interest 3  Down, Depressed, Hopeless 0  PHQ - 2 Score 3  Altered sleeping 2  Tired, decreased energy 3  Change in appetite 3  Feeling bad or failure about yourself  1  Trouble concentrating 0  Moving slowly or fidgety/restless 0  Suicidal thoughts 0  PHQ-9 Score 12     Review of Systems  Musculoskeletal: Positive for gait problem.  Neurological: Positive for numbness.  All other systems reviewed and are negative.      Objective:   Physical Exam  Constitutional: She is oriented to person, place, and time. She appears well-developed and well-nourished.  HENT:  Head: Normocephalic and atraumatic.  Eyes: Conjunctivae and EOM are normal. Pupils are equal, round, and reactive to light.  Musculoskeletal:       Right hip: Normal.       Left hip:  Normal.  Neurological: She is alert and oriented to person, place, and time.  Reflex Scores:      Patellar reflexes are 2+ on the right side and 1+ on the left side.      Achilles reflexes are 1+ on the right side and 1+ on the left side. Motor strength is 5/5 bilateral hip flexors knee extensors ankle dorsiflexor and plantar flexor  Psychiatric: She has a normal mood and affect.  Nursing note and vitals reviewed.         Assessment & Plan:  1.   History of chronic low back pain, lumbar stenosis with prior L3-4 L4-5 fusions. Hardware has been removed and patient had decompression and fusion of adjacent level disease at L2-3 and L5-S1. Currently doing well postoperatively. Has had supplemental [place] postoperatively in addition to her usual dosage of morphine sulfate. Will be out of Vistaril today or tomorrow. I think she can resume her usual regimen. Continue MS Contin 15 mg 3 times per day Continue MSIR 15 mg 4 times per day. As healing proceeds, may be able to wean down on some of these medications. Patient will follow-up postoperatively with her surgeon but this clinic will be writing for her chronic pain medications.

## 2014-12-31 LAB — PMP ALCOHOL METABOLITE (ETG): Ethyl Glucuronide (EtG): NEGATIVE ng/mL

## 2015-01-02 LAB — OPIATES/OPIOIDS (LC/MS-MS)
CODEINE URINE: NEGATIVE ng/mL (ref <50)
Hydrocodone: NEGATIVE ng/mL (ref <50)
Hydromorphone: 98 ng/mL (ref <50)
MORPHINE: 42895 ng/mL (ref <50)
NORHYDROCODONE, UR: NEGATIVE ng/mL (ref <50)
Noroxycodone, Ur: NEGATIVE ng/mL (ref <50)
Oxycodone, ur: NEGATIVE ng/mL (ref <50)
Oxymorphone: NEGATIVE ng/mL (ref <50)

## 2015-01-03 LAB — PRESCRIPTION MONITORING PROFILE (SOLSTAS)
Amphetamine/Meth: NEGATIVE ng/mL
BARBITURATE SCREEN, URINE: NEGATIVE ng/mL
Benzodiazepine Screen, Urine: NEGATIVE ng/mL
Buprenorphine, Urine: NEGATIVE ng/mL
CREATININE, URINE: 101.06 mg/dL (ref >20.0)
Cannabinoid Scrn, Ur: NEGATIVE ng/mL
Carisoprodol, Urine: NEGATIVE ng/mL
Cocaine Metabolites: NEGATIVE ng/mL
ECSTASY: NEGATIVE ng/mL
FENTANYL URINE: NEGATIVE ng/mL
Meperidine, Ur: NEGATIVE ng/mL
Methadone Screen, Urine: NEGATIVE ng/mL
Nitrites, Initial: NEGATIVE ug/mL
OXYCODONE SCRN UR: NEGATIVE ng/mL
PH URINE, INITIAL: 6.3 pH (ref 4.5–8.9)
Propoxyphene: NEGATIVE ng/mL
TAPENTADOLUR: NEGATIVE ng/mL
Tramadol Scrn, Ur: NEGATIVE ng/mL
ZOLPIDEM, URINE: NEGATIVE ng/mL

## 2015-01-12 ENCOUNTER — Other Ambulatory Visit: Payer: Self-pay | Admitting: Registered Nurse

## 2015-01-14 NOTE — Progress Notes (Signed)
Urine drug screen for this encounter is consistent for prescribed medication 

## 2015-01-27 ENCOUNTER — Other Ambulatory Visit: Payer: Self-pay | Admitting: Registered Nurse

## 2015-01-30 ENCOUNTER — Ambulatory Visit: Payer: Medicare Other

## 2015-01-30 ENCOUNTER — Ambulatory Visit: Payer: Medicare Other | Admitting: Physical Medicine & Rehabilitation

## 2015-02-02 ENCOUNTER — Encounter: Payer: Self-pay | Admitting: Physical Medicine & Rehabilitation

## 2015-02-02 ENCOUNTER — Ambulatory Visit (HOSPITAL_BASED_OUTPATIENT_CLINIC_OR_DEPARTMENT_OTHER): Payer: Medicare Other | Admitting: Physical Medicine & Rehabilitation

## 2015-02-02 ENCOUNTER — Encounter: Payer: Medicare Other | Attending: Physical Medicine & Rehabilitation

## 2015-02-02 VITALS — BP 128/73 | HR 76

## 2015-02-02 DIAGNOSIS — M961 Postlaminectomy syndrome, not elsewhere classified: Secondary | ICD-10-CM

## 2015-02-02 DIAGNOSIS — M5414 Radiculopathy, thoracic region: Secondary | ICD-10-CM

## 2015-02-02 DIAGNOSIS — M5417 Radiculopathy, lumbosacral region: Secondary | ICD-10-CM

## 2015-02-02 MED ORDER — MORPHINE SULFATE 15 MG PO TABS: 15.0000 mg | ORAL_TABLET | Freq: Three times a day (TID) | ORAL | Status: DC

## 2015-02-02 MED ORDER — MORPHINE SULFATE ER 15 MG PO TBCR: 15.0000 mg | EXTENDED_RELEASE_TABLET | Freq: Three times a day (TID) | ORAL | Status: DC

## 2015-02-02 NOTE — Progress Notes (Signed)
Subjective:    Patient ID: [name] [name], female    DOB: 1956/02/22, 59 y.o.   MRN: [id number] 11/28/2014 THE PATIENT UNDERWENT UNCOMPLICATED BILATERAL LUMBAR DECOMPRESSION L2-SACRUM, METAL REMOVAL/FUSION EXPLORATION L3-4, L4-5 [SOLID FUSION BILATERAL L3-4, L4-5], INSTRUMENTATION/POSTEROLATERAL FUSION L2-3, L5-S1, RIGHT POSTERIOR ILIAC CREST BONE GRAFT Dr Loreen Freud at [place], [place]   HPI  HHPT, finished Patient feeling like her pain has improved both back pain and leg pain have improved postoperatively. Has a follow-up with her surgeon on August 25. She is still on a walker and is awaiting release to ambulate with a cane as she did before her surgery. No falls Patient is now dressing herself and bathing herself Pain Inventory Average Pain 6 Pain Right Now 6 My pain is constant and aching  In the last 24 hours, has pain interfered with the following? General activity 5 Relation with others 5 Enjoyment of life 5 What TIME of day is your pain at its worst? morning Sleep (in general) NA  Pain is worse with: walking, sitting, inactivity and standing Pain improves with: heat/ice and medication Relief from Meds: 5  Mobility walk with assistance use a walker how many minutes can you walk? 10 do you drive?  no use a wheelchair Do you have any goals in this area?  yes  Function disabled: date disabled 10/2007 I need assistance with the following:  meal prep, household duties and shopping Do you have any goals in this area?  yes  Neuro/Psych trouble walking  Prior Studies Any changes since last visit?  no  Physicians involved in your care Any changes since last visit?  no   Family History  Problem Relation Age of Onset  . Heart disease Mother   . Hypertension Mother    Social History   Social History  . Marital Status: Widowed    Spouse Name: N/A  . Number of Children: N/A  . Years of Education: N/A   Social History Main Topics  . Smoking status:  Former Smoker -- 0.50 packs/day for 20 years    Types: Cigarettes    Quit date: 07/21/2008  . Smokeless tobacco: Never Used  . Alcohol Use: No  . Drug Use: No  . Sexual Activity: Not Asked   Other Topics Concern  . None   Social History Narrative   Past Surgical History  Procedure Laterality Date  . Spine surgery    . Abdominal hysterectomy    . Colon surgery  05/11/11    Duke Lysis of adhesions Crohn's   Past Medical History  Diagnosis Date  . Disorder of sacroiliac joint   . Lumbosacral neuritis   . Lumbosacral radiculitis   . Lumbar post-laminectomy syndrome   . Sciatica   . Lumbago   . Hypertension   . Crohn's disease    BP 128/73 mmHg  Pulse 76  SpO2 97%  Opioid Risk Score:   Fall Risk Score:  `1  Depression screen PHQ 2/9  Depression screen [facility] 2/9 02/02/2015 09/15/2014  Decreased Interest 3 3  Down, Depressed, Hopeless 0 0  PHQ - 2 Score 3 3  Altered sleeping - 2  Tired, decreased energy - 3  Change in appetite - 3  Feeling bad or failure about yourself  - 1  Trouble concentrating - 0  Moving slowly or fidgety/restless - 0  Suicidal thoughts - 0  PHQ-9 Score - 12     Review of Systems  Constitutional: Positive for diaphoresis and unexpected weight change.  Musculoskeletal: Positive for gait problem.  All other systems reviewed and are negative.      Objective:   Physical Exam  Constitutional: She is oriented to person, place, and time. She appears well-developed and well-nourished.  HENT:  Head: Normocephalic and atraumatic.  Right Ear: External ear normal.  Left Ear: External ear normal.  Eyes: Conjunctivae and EOM are normal. Pupils are equal, round, and reactive to light.  Neck: Normal range of motion. Neck supple.  Neurological: She is alert and oriented to person, place, and time.  Motor strength is 4/5 bilateral hip flexor and knee extensor ankle dorsiflexor Negative straight leg raising  Ambulates with a rolling walker no evidence  of toe drag or knee instability  Psychiatric: She has a normal mood and affect.  Nursing note and vitals reviewed.         Assessment & Plan:  62. 59 year old female with chronic L3-L4 L4-L5 decompression and fusion with gradual worsening of pain that had extension of her fusion from L2-S1. This was performed in June 2016. She's had good postoperative recovery. At this point she is independent with her self-care once again she continues to use a walker for ambulation. Both her back pain as well as her lower extremity pain have improved. I think at this point we could start weaning down a little bit on her pain medication. We'll reduce morphine sulfate 15 mg immediate release from 4 times a day to 3 times a day Continue morphine sulfate controlled release 15 mg 3 times a day  Return to clinic one month at that point we may be able to reduce her short-acting morphine to twice a day In the meantime patient will follow-up with her spine surgeon

## 2015-02-02 NOTE — Patient Instructions (Signed)
Will reduce short acting morphine to 3 times a day  Will cont long acting morphime 3 times a day

## 2015-03-02 ENCOUNTER — Ambulatory Visit (HOSPITAL_BASED_OUTPATIENT_CLINIC_OR_DEPARTMENT_OTHER): Payer: Medicare Other | Admitting: Physical Medicine & Rehabilitation

## 2015-03-02 ENCOUNTER — Encounter: Payer: Self-pay | Admitting: Physical Medicine & Rehabilitation

## 2015-03-02 ENCOUNTER — Encounter: Payer: Medicare Other | Attending: Physical Medicine & Rehabilitation

## 2015-03-02 VITALS — BP 135/71 | HR 72

## 2015-03-02 DIAGNOSIS — M961 Postlaminectomy syndrome, not elsewhere classified: Secondary | ICD-10-CM

## 2015-03-02 DIAGNOSIS — M5417 Radiculopathy, lumbosacral region: Secondary | ICD-10-CM | POA: Insufficient documentation

## 2015-03-02 DIAGNOSIS — M5414 Radiculopathy, thoracic region: Secondary | ICD-10-CM | POA: Diagnosis not present

## 2015-03-02 MED ORDER — MORPHINE SULFATE ER 15 MG PO TBCR: 15.0000 mg | EXTENDED_RELEASE_TABLET | Freq: Three times a day (TID) | ORAL | Status: DC

## 2015-03-02 MED ORDER — MORPHINE SULFATE 15 MG PO TABS: 15.0000 mg | ORAL_TABLET | Freq: Three times a day (TID) | ORAL | Status: DC

## 2015-03-02 NOTE — Progress Notes (Signed)
Subjective:    Patient ID: [name] [name], female    DOB: 11/08/1955, 59 y.o.   MRN: [id number]  HPI  Getting feeling back in buttocks, some burning in that area.  Sitting will increase pain Overall pain is better Currently using cane rather than walker  No constipation- eats oatmeal  No bending, lifting and twisting per surgeon   Pain Inventory Average Pain 6 Pain Right Now 5 My pain is constant, burning and aching  In the last 24 hours, has pain interfered with the following? General activity 5 Relation with others 5 Enjoyment of life 5 What TIME of day is your pain at its worst? all Sleep (in general) Fair  Pain is worse with: walking, bending and standing Pain improves with: rest, heat/ice and medication Relief from Meds: 5  Mobility use a cane ability to climb steps?  yes do you drive?  yes  Function disabled: date disabled 08/2006 I need assistance with the following:  household duties  Neuro/Psych numbness  Prior Studies Any changes since last visit?  no  Physicians involved in your care Any changes since last visit?  no   Family History  Problem Relation Age of Onset  . Heart disease Mother   . Hypertension Mother    Social History   Social History  . Marital Status: Widowed    Spouse Name: N/A  . Number of Children: N/A  . Years of Education: N/A   Social History Main Topics  . Smoking status: Former Smoker -- 0.50 packs/day for 20 years    Types: Cigarettes    Quit date: 07/21/2008  . Smokeless tobacco: Never Used  . Alcohol Use: No  . Drug Use: No  . Sexual Activity: Not Asked   Other Topics Concern  . None   Social History Narrative   Past Surgical History  Procedure Laterality Date  . Spine surgery    . Abdominal hysterectomy    . Colon surgery  05/11/11    Duke Lysis of adhesions Crohn's   Past Medical History  Diagnosis Date  . Disorder of sacroiliac joint   . Lumbosacral neuritis   . Lumbosacral radiculitis    . Lumbar post-laminectomy syndrome   . Sciatica   . Lumbago   . Hypertension   . Crohn's disease    BP 135/71 mmHg  Pulse 72  SpO2 98%  Opioid Risk Score:   Fall Risk Score:  `1  Depression screen PHQ 2/9  Depression screen [facility] 2/9 03/02/2015 02/02/2015 09/15/2014  Decreased Interest 3 3 3   Down, Depressed, Hopeless 0 0 0  PHQ - 2 Score 3 3 3   Altered sleeping - - 2  Tired, decreased energy - - 3  Change in appetite - - 3  Feeling bad or failure about yourself  - - 1  Trouble concentrating - - 0  Moving slowly or fidgety/restless - - 0  Suicidal thoughts - - 0  PHQ-9 Score - - 12    Review of Systems  Constitutional: Positive for diaphoresis and unexpected weight change.  Neurological: Positive for numbness.  All other systems reviewed and are negative.      Objective:   Physical Exam  Constitutional: She is oriented to person, place, and time. She appears well-developed and well-nourished.  HENT:  Head: Normocephalic and atraumatic.  Eyes: Conjunctivae and EOM are normal. Pupils are equal, round, and reactive to light.  Musculoskeletal:       Thoracic back: She exhibits decreased range of motion  and deformity.       Lumbar back: She exhibits decreased range of motion and deformity.  Mild lower thoracic upper lumbar scoliosis Healed incision lumbar spine Tenderness palpation along the lower lumbar area Decreased lumbar flexion and extension lateral bending  Decreased thoracic bending   Neurological: She is alert and oriented to person, place, and time.  Psychiatric: She has a normal mood and affect.  Nursing note and vitals reviewed.         Assessment & Plan:  1. Lumbar postlaminectomy syndrome overall doing better after revision surgery. She has been able to reduce her short acting pain medication. She is now walking with a walker. Still has some bending and lifting twisting restrictions. Follows up with surgeon next month. Will continue long-acting  morphine 15 mg 3 times per day Reduced short acting morphine to 15 mg twice a day Follow-up in one month and we'll decide whether or not we can go down to once a day on the short acting morphine

## 2015-03-02 NOTE — Patient Instructions (Signed)
Will continue to wean the short acting morphine to twice a day. If you're doing well next month we may reduce further to once a day  We'll continue the current dose of long-acting morphine

## 2015-03-11 ENCOUNTER — Other Ambulatory Visit: Payer: Self-pay | Admitting: Registered Nurse

## 2015-03-30 ENCOUNTER — Encounter: Payer: Medicare Other | Attending: Physical Medicine & Rehabilitation

## 2015-03-30 ENCOUNTER — Ambulatory Visit (HOSPITAL_BASED_OUTPATIENT_CLINIC_OR_DEPARTMENT_OTHER): Payer: Medicare Other | Admitting: Physical Medicine & Rehabilitation

## 2015-03-30 ENCOUNTER — Encounter: Payer: Self-pay | Admitting: Physical Medicine & Rehabilitation

## 2015-03-30 VITALS — BP 112/75 | HR 94

## 2015-03-30 DIAGNOSIS — M5414 Radiculopathy, thoracic region: Secondary | ICD-10-CM | POA: Insufficient documentation

## 2015-03-30 DIAGNOSIS — M5417 Radiculopathy, lumbosacral region: Secondary | ICD-10-CM | POA: Insufficient documentation

## 2015-03-30 DIAGNOSIS — M961 Postlaminectomy syndrome, not elsewhere classified: Secondary | ICD-10-CM

## 2015-03-30 MED ORDER — MORPHINE SULFATE ER 15 MG PO TBCR: 15.0000 mg | EXTENDED_RELEASE_TABLET | Freq: Three times a day (TID) | ORAL | Status: DC

## 2015-03-30 MED ORDER — GABAPENTIN 300 MG PO CAPS: ORAL_CAPSULE | ORAL | Status: DC

## 2015-03-30 MED ORDER — MORPHINE SULFATE 15 MG PO TABS: 15.0000 mg | ORAL_TABLET | Freq: Two times a day (BID) | ORAL | Status: DC

## 2015-03-30 NOTE — Progress Notes (Signed)
Subjective:    Patient ID: [name] [name], female    DOB: Mar 29, 1956, 59 y.o.   MRN: [id number] 11/28/2014 THE PATIENT UNDERWENT UNCOMPLICATED BILATERAL LUMBAR DECOMPRESSION L2-SACRUM, METAL REMOVAL/FUSION EXPLORATION L3-4, L4-5 [SOLID FUSION BILATERAL L3-4, L4-5], INSTRUMENTATION/POSTEROLATERAL FUSION L2-3, L5-S1, RIGHT POSTERIOR ILIAC CREST BONE GRAFT Dr Loreen Freud at [place], [place] HPI Pt didn't realize that MS IR reduced to 15mg  BID, Ran out because she was still taking it 3 times a day.  Overall she has had less pain since prior to her surgery. She is in agreement with trying to reduce her pain medications if possible. Burning pain on occasion Numb spot left side of low back  Pain Inventory Average Pain 6 Pain Right Now 6 My pain is constant and dull  In the last 24 hours, has pain interfered with the following? General activity 6 Relation with others 7 Enjoyment of life 7 What TIME of day is your pain at its worst? morning and evening and night Sleep (in general) Fair  Pain is worse with: bending and standing Pain improves with: rest and heat/ice Relief from Meds: 4  Mobility use a cane how many minutes can you walk? 15 ability to climb steps?  yes do you drive?  yes  Function disabled: date disabled . I need assistance with the following:  household duties  Neuro/Psych numbness  Prior Studies Any changes since last visit?  no  Physicians involved in your care Any changes since last visit?  no   Family History  Problem Relation Age of Onset  . Heart disease Mother   . Hypertension Mother    Social History   Social History  . Marital Status: Widowed    Spouse Name: N/A  . Number of Children: N/A  . Years of Education: N/A   Social History Main Topics  . Smoking status: Former Smoker -- 0.50 packs/day for 20 years    Types: Cigarettes    Quit date: 07/21/2008  . Smokeless tobacco: Never Used  . Alcohol Use: No  . Drug Use: No  . Sexual  Activity: Not Asked   Other Topics Concern  . None   Social History Narrative   Past Surgical History  Procedure Laterality Date  . Spine surgery    . Abdominal hysterectomy    . Colon surgery  05/11/11    Duke Lysis of adhesions Crohn's   Past Medical History  Diagnosis Date  . Disorder of sacroiliac joint   . Lumbosacral neuritis   . Lumbosacral radiculitis   . Lumbar post-laminectomy syndrome   . Sciatica   . Lumbago   . Hypertension   . Crohn's disease ([place])    BP 112/75 mmHg  Pulse 94  SpO2 97%  Opioid Risk Score:   Fall Risk Score:  `1  Depression screen PHQ 2/9  Depression screen [facility] 2/9 03/02/2015 02/02/2015 09/15/2014  Decreased Interest 3 3 3   Down, Depressed, Hopeless 0 0 0  PHQ - 2 Score 3 3 3   Altered sleeping - - 2  Tired, decreased energy - - 3  Change in appetite - - 3  Feeling bad or failure about yourself  - - 1  Trouble concentrating - - 0  Moving slowly or fidgety/restless - - 0  Suicidal thoughts - - 0  PHQ-9 Score - - 12    Review of Systems  Neurological: Positive for numbness.  All other systems reviewed and are negative.      Objective:   Physical Exam  Constitutional: She is oriented to person, place, and time. She appears well-developed and well-nourished.  HENT:  Head: Normocephalic and atraumatic.  Eyes: Conjunctivae and EOM are normal. Pupils are equal, round, and reactive to light.  Musculoskeletal:       Lumbar back: She exhibits decreased range of motion, tenderness and deformity.  Flat back Well-healed incision but with multiple scars in the lumbar spine  Neurological: She is alert and oriented to person, place, and time.  Psychiatric: She has a normal mood and affect.  Nursing note and vitals reviewed.  Motor strength is 5/5 bilateral hip flexor and extensor ankle dorsiflexor       Assessment & Plan:  1. Lumbar postlaminectomy syndrome has had multiple spine surgeries, she feels like her most recent revision was  most helpful The patient is trying to increase her level of mobility and reduce her reliance on the straight cane. She still uses the lumbar orthosis as instructed by spine surgeon  I encouraged her continuing to increase her mobility. I do think she should be able to ambulate without a cane. She has no evidence of foot drag or other knee instability  We'll continue MS Contin 15 mg 3 times per day, no side effects no signs of abuse.90 tablets written for this month Still allowing patient to be more active Reduce MS IR 15 mg from 3 times a day to twice a day. Discussed this with patient she agrees. 60 tablets written for this month.  Return to clinic one month potentially reduce MSIR again versus reducing MS Contin to 15 mg twice a day

## 2015-03-30 NOTE — Patient Instructions (Addendum)
Short acting morphine reduced to twice a day  Cont walking, try to wean from cane in house

## 2015-04-27 ENCOUNTER — Encounter: Payer: Self-pay | Admitting: Registered Nurse

## 2015-04-27 ENCOUNTER — Encounter: Payer: Medicare Other | Attending: Physical Medicine & Rehabilitation | Admitting: Registered Nurse

## 2015-04-27 VITALS — BP 140/79 | HR 73

## 2015-04-27 DIAGNOSIS — G894 Chronic pain syndrome: Secondary | ICD-10-CM | POA: Diagnosis not present

## 2015-04-27 DIAGNOSIS — M5414 Radiculopathy, thoracic region: Secondary | ICD-10-CM | POA: Insufficient documentation

## 2015-04-27 DIAGNOSIS — M5417 Radiculopathy, lumbosacral region: Secondary | ICD-10-CM | POA: Insufficient documentation

## 2015-04-27 DIAGNOSIS — Z5181 Encounter for therapeutic drug level monitoring: Secondary | ICD-10-CM | POA: Diagnosis not present

## 2015-04-27 DIAGNOSIS — Z79899 Other long term (current) drug therapy: Secondary | ICD-10-CM

## 2015-04-27 DIAGNOSIS — M961 Postlaminectomy syndrome, not elsewhere classified: Secondary | ICD-10-CM | POA: Diagnosis not present

## 2015-04-27 MED ORDER — MORPHINE SULFATE 15 MG PO TABS: 15.0000 mg | ORAL_TABLET | Freq: Two times a day (BID) | ORAL | Status: DC

## 2015-04-27 MED ORDER — MORPHINE SULFATE ER 15 MG PO TBCR: 15.0000 mg | EXTENDED_RELEASE_TABLET | Freq: Three times a day (TID) | ORAL | Status: DC

## 2015-04-27 NOTE — Progress Notes (Signed)
Subjective:    Patient ID: [name] [name], female    DOB: 09-Feb-1956, 59 y.o.   MRN: [id number]  HPI: [name] [name] is a 59 year old female who returns for follow up for chronic pain and medication refill.She says her pain is located in her lower back and tenderness in left hip She rates her pain 5. Her current exercise regime is walking.  S/P surgery  with Dr. Elmer Sow Derian:   UNCOMPLICATED BILATERAL LUMBAR DECOMPRESSION L2-SACRUM, METAL REMOVAL/FUSION EXPLORATION L3-4, L4-5 [SOLID FUSION BILATERAL L3-4, L4-5], INSTRUMENTATION/POSTEROLATERAL FUSION L2-3, L5-S1, RIGHT POSTERIOR ILIAC CREST BONE GRAFT   Pain Inventory Average Pain 6 Pain Right Now 5 My pain is constant, burning and aching  In the last 24 hours, has pain interfered with the following? General activity 7 Relation with others 6 Enjoyment of life 6 What TIME of day is your pain at its worst? Morning, Daytime and Evening Sleep (in general) Fair  Pain is worse with: walking, bending, inactivity and standing Pain improves with: rest, heat/ice and medication Relief from Meds: 5  Mobility walk with assistance use a cane how many minutes can you walk? 10 ability to climb steps?  yes do you drive?  yes Do you have any goals in this area?  yes  Function disabled: date disabled 10/2007 I need assistance with the following:  household duties Do you have any goals in this area?  yes  Neuro/Psych trouble walking  Prior Studies Any changes since last visit?  no  Physicians involved in your care Any changes since last visit?  no   Family History  Problem Relation Age of Onset  . Heart disease Mother   . Hypertension Mother    Social History   Social History  . Marital Status: Widowed    Spouse Name: N/A  . Number of Children: N/A  . Years of Education: N/A   Social History Main Topics  . Smoking status: Former Smoker -- 0.50 packs/day for 20 years    Types: Cigarettes    Quit date:  07/21/2008  . Smokeless tobacco: Never Used  . Alcohol Use: No  . Drug Use: No  . Sexual Activity: Not Asked   Other Topics Concern  . None   Social History Narrative   Past Surgical History  Procedure Laterality Date  . Spine surgery    . Abdominal hysterectomy    . Colon surgery  05/11/11    Duke Lysis of adhesions Crohn's   Past Medical History  Diagnosis Date  . Disorder of sacroiliac joint   . Lumbosacral neuritis   . Lumbosacral radiculitis   . Lumbar post-laminectomy syndrome   . Sciatica   . Lumbago   . Hypertension   . Crohn's disease ([place])    BP 140/79 mmHg  Pulse 73  SpO2 97%  Opioid Risk Score:   Fall Risk Score:  `1  Depression screen PHQ 2/9  Depression screen [facility] 2/9 03/02/2015 02/02/2015 09/15/2014  Decreased Interest 3 3 3   Down, Depressed, Hopeless 0 0 0  PHQ - 2 Score 3 3 3   Altered sleeping - - 2  Tired, decreased energy - - 3  Change in appetite - - 3  Feeling bad or failure about yourself  - - 1  Trouble concentrating - - 0  Moving slowly or fidgety/restless - - 0  Suicidal thoughts - - 0  PHQ-9 Score - - 12     Review of Systems  Neurological:  Gait Instability  All other systems reviewed and are negative.      Objective:   Physical Exam  Constitutional: She is oriented to person, place, and time. She appears well-developed and well-nourished.  HENT:  Head: Normocephalic and atraumatic.  Neck: Normal range of motion. Neck supple.  Cardiovascular: Normal rate and regular rhythm.   Pulmonary/Chest: Effort normal and breath sounds normal.  Musculoskeletal:  Normal Muscle Bulk and Muscle Testing Reveals: Upper Extremities: Full ROM and Muscle Strength 5/5 Lumbar Paraspinal Tenderness: L-3- L-4 Lower Extremities: Full ROM and Muscle Strength 5/5 Arises from chair slowly Wide Based Gait  Neurological: She is alert and oriented to person, place, and time.  Skin: Skin is warm and dry.  Psychiatric: She has a normal mood and  affect.  Nursing note and vitals reviewed.         Assessment & Plan:  1.Low back pain with radiating symptoms in L3-4 distribution: continues to be limited by pain. Refilled: MS contin 15 mg 12 hr. tablet one tablet  tid with MSIR 15 mg One Tablet Twice a Day #60. Encouraged to Continue exercise regime.  2. Insomnia: Continue Trazodone   20 minutes of face to face patient care time was spent during this visit. All questions were encouraged and answered.   F/U in 1 month

## 2015-05-11 ENCOUNTER — Other Ambulatory Visit: Payer: Self-pay | Admitting: Registered Nurse

## 2015-05-18 ENCOUNTER — Other Ambulatory Visit: Payer: Self-pay | Admitting: Registered Nurse

## 2015-05-26 ENCOUNTER — Encounter: Payer: Self-pay | Admitting: Registered Nurse

## 2015-05-26 ENCOUNTER — Encounter: Payer: Medicare Other | Attending: Physical Medicine & Rehabilitation | Admitting: Registered Nurse

## 2015-05-26 VITALS — BP 126/70 | HR 80 | Resp 14

## 2015-05-26 DIAGNOSIS — Z5181 Encounter for therapeutic drug level monitoring: Secondary | ICD-10-CM

## 2015-05-26 DIAGNOSIS — M5417 Radiculopathy, lumbosacral region: Secondary | ICD-10-CM | POA: Insufficient documentation

## 2015-05-26 DIAGNOSIS — M5414 Radiculopathy, thoracic region: Secondary | ICD-10-CM | POA: Insufficient documentation

## 2015-05-26 DIAGNOSIS — G894 Chronic pain syndrome: Secondary | ICD-10-CM | POA: Diagnosis not present

## 2015-05-26 DIAGNOSIS — Z79899 Other long term (current) drug therapy: Secondary | ICD-10-CM

## 2015-05-26 DIAGNOSIS — M961 Postlaminectomy syndrome, not elsewhere classified: Secondary | ICD-10-CM | POA: Insufficient documentation

## 2015-05-26 MED ORDER — MORPHINE SULFATE ER 15 MG PO TBCR: 15.0000 mg | EXTENDED_RELEASE_TABLET | Freq: Three times a day (TID) | ORAL | Status: DC

## 2015-05-26 MED ORDER — MORPHINE SULFATE 15 MG PO TABS: 15.0000 mg | ORAL_TABLET | Freq: Two times a day (BID) | ORAL | Status: DC

## 2015-05-26 NOTE — Progress Notes (Signed)
Subjective:    Patient ID: [name] [name], female    DOB: 11/17/1955, 59 y.o.   MRN: [id number]  HPI: [name] [name] is a 59 year old female who returns for follow up for chronic pain and medication refill.She says her pain is located in her lower back mainly left side. She rates her pain 7. Also states her pain has intensified with the changing of the weather, she believes she has built up a tolerance to her medication. She will be scheduled with Dr. Letta Pate in January she verbalizes understanding.Her current exercise regime is walking, stretching and walking the stairs in her complex.  Pain Inventory Average Pain 5 Pain Right Now 7 My pain is constant, sharp and aching  In the last 24 hours, has pain interfered with the following? General activity 6 Relation with others 6 Enjoyment of life 6 What TIME of day is your pain at its worst? morning, daytime  Sleep (in general) Fair  Pain is worse with: bending and some activites Pain improves with: rest, heat/ice and medication Relief from Meds: 2  Mobility walk without assistance how many minutes can you walk? 10 ability to climb steps?  yes do you drive?  yes Do you have any goals in this area?  yes  Function not employed: date last employed 03/08 disabled: date disabled 05/09 I need assistance with the following:  household duties Do you have any goals in this area?  yes  Neuro/Psych No problems in this area  Prior Studies Any changes since last visit?  no  Physicians involved in your care Any changes since last visit?  no   Family History  Problem Relation Age of Onset  . Heart disease Mother   . Hypertension Mother    Social History   Social History  . Marital Status: Widowed    Spouse Name: N/A  . Number of Children: N/A  . Years of Education: N/A   Social History Main Topics  . Smoking status: Former Smoker -- 0.50 packs/day for 20 years    Types: Cigarettes    Quit date: 07/21/2008  .  Smokeless tobacco: Never Used  . Alcohol Use: No  . Drug Use: No  . Sexual Activity: Not Asked   Other Topics Concern  . None   Social History Narrative   Past Surgical History  Procedure Laterality Date  . Spine surgery    . Abdominal hysterectomy    . Colon surgery  05/11/11    [facility] Lysis of adhesions Crohn's   Past Medical History  Diagnosis Date  . Disorder of sacroiliac joint   . Lumbosacral neuritis   . Lumbosacral radiculitis   . Lumbar post-laminectomy syndrome   . Sciatica   . Lumbago   . Hypertension   . Crohn's disease ([place])    BP 126/70 mmHg  Pulse 80  Resp 14  SpO2 97%  Opioid Risk Score:   Fall Risk Score:  `1  Depression screen PHQ 2/9  Depression screen [facility] 2/9 03/02/2015 02/02/2015 09/15/2014  Decreased Interest 3 3 3   Down, Depressed, Hopeless 0 0 0  PHQ - 2 Score 3 3 3   Altered sleeping - - 2  Tired, decreased energy - - 3  Change in appetite - - 3  Feeling bad or failure about yourself  - - 1  Trouble concentrating - - 0  Moving slowly or fidgety/restless - - 0  Suicidal thoughts - - 0  PHQ-9 Score - - 12  Review of Systems  All other systems reviewed and are negative.      Objective:   Physical Exam  Constitutional: She is oriented to person, place, and time. She appears well-developed and well-nourished.  HENT:  Head: Normocephalic and atraumatic.  Neck: Normal range of motion. Neck supple.  Cardiovascular: Normal rate and regular rhythm.   Pulmonary/Chest: Effort normal and breath sounds normal.  Musculoskeletal:  Normal Muscle Bulk and Muscle Testing Reveals: Upper Extremities: Full ROM and Muscle Strength 5/5 Lumbar Paraspinal Tenderness: L-4- L-5 Sacral Tenderness Lower Extremities: Full ROM and Muscle Strength 5/5 Arises from chair slowly Antalgic Gait  Neurological: She is alert and oriented to person, place, and time.  Skin: Skin is warm and dry.  Psychiatric: She has a normal mood and affect.  Nursing note and  vitals reviewed.         Assessment & Plan:  1.Low back pain with radiating symptoms in L3-4 distribution: continues to be limited by pain. Refilled: MS contin 15 mg 12 hr. tablet one tablet tid with MSIR 15 mg One Tablet Twice a Day #60. Encouraged to Continue exercise regime.  2. Insomnia: Continue Trazodone   20 minutes of face to face patient care time was spent during this visit. All questions were encouraged and answered.   F/U in 1 month

## 2015-06-25 ENCOUNTER — Encounter: Payer: Medicare Other | Attending: Physical Medicine & Rehabilitation

## 2015-06-25 ENCOUNTER — Ambulatory Visit (HOSPITAL_BASED_OUTPATIENT_CLINIC_OR_DEPARTMENT_OTHER): Payer: Medicare Other | Admitting: Physical Medicine & Rehabilitation

## 2015-06-25 ENCOUNTER — Encounter: Payer: Self-pay | Admitting: Physical Medicine & Rehabilitation

## 2015-06-25 VITALS — BP 150/82 | HR 71 | Resp 14

## 2015-06-25 DIAGNOSIS — M5417 Radiculopathy, lumbosacral region: Secondary | ICD-10-CM | POA: Diagnosis not present

## 2015-06-25 DIAGNOSIS — Z5181 Encounter for therapeutic drug level monitoring: Secondary | ICD-10-CM | POA: Diagnosis not present

## 2015-06-25 DIAGNOSIS — M961 Postlaminectomy syndrome, not elsewhere classified: Secondary | ICD-10-CM

## 2015-06-25 DIAGNOSIS — M25461 Effusion, right knee: Secondary | ICD-10-CM | POA: Diagnosis not present

## 2015-06-25 DIAGNOSIS — Z79899 Other long term (current) drug therapy: Secondary | ICD-10-CM

## 2015-06-25 DIAGNOSIS — M5414 Radiculopathy, thoracic region: Secondary | ICD-10-CM

## 2015-06-25 MED ORDER — MORPHINE SULFATE ER 15 MG PO TBCR: 15.0000 mg | EXTENDED_RELEASE_TABLET | Freq: Three times a day (TID) | ORAL | Status: DC

## 2015-06-25 MED ORDER — MORPHINE SULFATE 15 MG PO TABS: 15.0000 mg | ORAL_TABLET | Freq: Three times a day (TID) | ORAL | Status: DC | PRN

## 2015-06-25 NOTE — Progress Notes (Signed)
Subjective:    Patient ID: [name] [name], female    DOB: Jun 11, 1956, 60 y.o.   MRN: [id number]  HPI Primary complaint is right knee pain Denies any falls or other trauma to that area. No prior history of pain or swelling. She notes that the right knee swells up. Mainly in the front but sometimes in the back of the knee. No numbness or tingling in the knee. Patient has limitation in terms of range of motion  In terms of her chronic low back pain she states she's had increasing pain in the iliac crest graft donor site. She had been reduced from 3 times a day MSIR 15 mg to twice a day. She continues on MS Contin 15 mg extended release 3 times a day Pain Inventory Average Pain 7 Pain Right Now 8 My pain is constant and aching  In the last 24 hours, has pain interfered with the following? General activity 7 Relation with others 8 Enjoyment of life 8 What TIME of day is your pain at its worst? morning, daytime, night Sleep (in general) Poor  Pain is worse with: walking, bending, sitting, inactivity and standing Pain improves with: heat/ice and medication Relief from Meds: 3  Mobility walk without assistance how many minutes can you walk? 15 ability to climb steps?  yes do you drive?  yes Do you have any goals in this area?  yes  Function not employed: date last employed . disabled: date disabled . Do you have any goals in this area?  yes  Neuro/Psych spasms  Prior Studies Any changes since last visit?  no  Physicians involved in your care Any changes since last visit?  no   Family History  Problem Relation Age of Onset  . Heart disease Mother   . Hypertension Mother    Social History   Social History  . Marital Status: Widowed    Spouse Name: N/A  . Number of Children: N/A  . Years of Education: N/A   Social History Main Topics  . Smoking status: Former Smoker -- 0.50 packs/day for 20 years    Types: Cigarettes    Quit date: 07/21/2008  . Smokeless  tobacco: Never Used  . Alcohol Use: No  . Drug Use: No  . Sexual Activity: Not Asked   Other Topics Concern  . None   Social History Narrative   Past Surgical History  Procedure Laterality Date  . Spine surgery    . Abdominal hysterectomy    . Colon surgery  05/11/11    [facility] Lysis of adhesions Crohn's   Past Medical History  Diagnosis Date  . Disorder of sacroiliac joint   . Lumbosacral neuritis   . Lumbosacral radiculitis   . Lumbar post-laminectomy syndrome   . Sciatica   . Lumbago   . Hypertension   . Crohn's disease ([place])    BP 150/82 mmHg  Pulse 71  Resp 14  SpO2 98%  Opioid Risk Score:   Fall Risk Score:  `1  Depression screen PHQ 2/9  Depression screen [facility] 2/9 03/02/2015 02/02/2015 09/15/2014  Decreased Interest 3 3 3   Down, Depressed, Hopeless 0 0 0  PHQ - 2 Score 3 3 3   Altered sleeping - - 2  Tired, decreased energy - - 3  Change in appetite - - 3  Feeling bad or failure about yourself  - - 1  Trouble concentrating - - 0  Moving slowly or fidgety/restless - - 0  Suicidal thoughts - -  0  PHQ-9 Score - - 12     Review of Systems  All other systems reviewed and are negative.      Objective:   Physical Exam  Constitutional: She is oriented to person, place, and time. She appears well-developed and well-nourished.  HENT:  Head: Normocephalic and atraumatic.  Eyes: Conjunctivae and EOM are normal. Pupils are equal, round, and reactive to light.  Neck: Normal range of motion.  Neurological: She is alert and oriented to person, place, and time. She has normal strength.  Psychiatric: She has a normal mood and affect.  Nursing note and vitals reviewed.   Right knee moderate effusion. Mild tenderness to palpation along the joint line. No pain around the patella. No discoloration. Negative straight leg raise can Knee range of motion is full with extension with flexion it goes to 120 on the right and 130 on the left. Pain with right knee  flexion.      Assessment & Plan:  1. Lumbar post laminectomy syndrome status post lumbar fusion. She is having iliac crest pain. She was weaned down to MS IR 15 mg twice a day, will go back to 15 mg 3 times a day Continue MS Contin ER 15 mg 3 times a day  2. Right knee pain and swelling. Likely degenerative osteoarthritis versus meniscal tear. We will drain, doubt that this is gout given lack of acute inflammation  Knee injection Right(fill in if ultrasound guidance)  Indication:RightKnee pain not relieved by medication management and other conservative care.  Informed consent was obtained after describing risks and benefits of the procedure with the patient, this includes bleeding, bruising, infection and medication side effects. The patient wishes to proceed and has given written consent. The patient was placed in a recumbent position. The medial aspect of the knee was marked and prepped with Betadine and alcohol. It was then entered with a 30-gauge 1/2 inch needle and 1 mL of 1% lidocaine was injected into the skin and subcutaneous tissue. Then another 21g 2"needle was inserted into the knee joint. After negative draw back for blood, a solution containing one ML of 6mg  per mL betamethasone and 3 mL of 1% lidocaine were injected. The patient tolerated the procedure well. Post procedure instructions were given.  We will reevaluate in 1 month possible ultrasound guided knee aspiration as well as diagnostic ultrasound. Check knee x-rays

## 2015-06-25 NOTE — Addendum Note (Signed)
Addended by: Caro Hight on: 06/25/2015 01:09 PM   Modules accepted: Orders

## 2015-07-02 LAB — TOXASSURE SELECT 13 (MW), URINE: PDF: 0

## 2015-07-03 NOTE — Progress Notes (Signed)
Urine drug screen for this encounter is consistent for prescribed medication 

## 2015-07-06 ENCOUNTER — Ambulatory Visit (HOSPITAL_COMMUNITY)
Admission: RE | Admit: 2015-07-06 | Discharge: 2015-07-06 | Disposition: A | Discharge status: Home / Self Care | Payer: Medicare Other | Source: Ambulatory Visit | Attending: Physical Medicine & Rehabilitation | Admitting: Physical Medicine & Rehabilitation

## 2015-07-06 DIAGNOSIS — M25561 Pain in right knee: Secondary | ICD-10-CM | POA: Diagnosis not present

## 2015-07-06 DIAGNOSIS — M5414 Radiculopathy, thoracic region: Secondary | ICD-10-CM

## 2015-07-06 DIAGNOSIS — M5417 Radiculopathy, lumbosacral region: Secondary | ICD-10-CM | POA: Insufficient documentation

## 2015-07-06 DIAGNOSIS — M25461 Effusion, right knee: Secondary | ICD-10-CM | POA: Insufficient documentation

## 2015-07-22 ENCOUNTER — Other Ambulatory Visit: Payer: Self-pay | Admitting: Registered Nurse

## 2015-07-23 ENCOUNTER — Encounter: Payer: Medicare Other | Attending: Physical Medicine & Rehabilitation

## 2015-07-23 ENCOUNTER — Ambulatory Visit (HOSPITAL_BASED_OUTPATIENT_CLINIC_OR_DEPARTMENT_OTHER): Payer: Medicare Other | Admitting: Physical Medicine & Rehabilitation

## 2015-07-23 ENCOUNTER — Encounter: Payer: Self-pay | Admitting: Physical Medicine & Rehabilitation

## 2015-07-23 VITALS — BP 160/92 | HR 86

## 2015-07-23 DIAGNOSIS — M961 Postlaminectomy syndrome, not elsewhere classified: Secondary | ICD-10-CM | POA: Diagnosis present

## 2015-07-23 DIAGNOSIS — M5414 Radiculopathy, thoracic region: Secondary | ICD-10-CM

## 2015-07-23 DIAGNOSIS — G8929 Other chronic pain: Secondary | ICD-10-CM

## 2015-07-23 DIAGNOSIS — M5417 Radiculopathy, lumbosacral region: Secondary | ICD-10-CM | POA: Insufficient documentation

## 2015-07-23 DIAGNOSIS — M25561 Pain in right knee: Secondary | ICD-10-CM

## 2015-07-23 MED ORDER — MORPHINE SULFATE 15 MG PO TABS: 15.0000 mg | ORAL_TABLET | Freq: Three times a day (TID) | ORAL | Status: DC | PRN

## 2015-07-23 MED ORDER — MORPHINE SULFATE ER 15 MG PO TBCR: 15.0000 mg | EXTENDED_RELEASE_TABLET | Freq: Three times a day (TID) | ORAL | Status: DC

## 2015-07-23 NOTE — Progress Notes (Signed)
Patient is here for follow-up of her right knee pain. 60 year old female with history lumbar stenosis who is undergone multiple lumbar fusions. The knee pain does not appear to be related to her low back. She's had some swelling in the right knee. She did have a right knee injection last month which improved her symptoms. X-rays were performed demonstrating no significant osteoarthritis. No effusion. No other abnormalities. She still feels like there is some swelling in the joint. Overall it does feel little bit better.  In terms her low back pain this is fairly stable on her current medications. She is not having side effects from her morphine.  Social, recent restraining order on a boyfriend that she had been dating for a short time. Currently living with daughter.  Review of systems no nausea vomiting or constipation  Examination Gen. No acute distress Waddling type of gait. No evidence to drag or knee instability. Right knee has no tenderness along the joint lines no tenderness along the patella. No pain with range of motion. Mood and affect are appropriate  Impression 1. Lumbar postlaminectomy syndrome with chronic postoperative pain. Continue morphine sulfate extended release 15 mg 3 times a day as well as morphine sulfate immediate release 15 mg 3 times a day.  2. Right knee pain likely meniscal tear however overall symptomatically improved. 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 If it worsens rather than continues to improve would recommend orthopedic consultation.

## 2015-07-23 NOTE — Patient Instructions (Signed)
Knee x-ray showed no significant arthritis. I suspect you have a meniscal tear, an orthopedic surgeon can do arthroscopic on this however he may want to give it some time given her recent back surgery  There was a little bit of fluid in the joint on the ultrasound but not enough to drain

## 2015-08-20 ENCOUNTER — Encounter: Payer: Medicare Other | Attending: Physical Medicine & Rehabilitation | Admitting: Registered Nurse

## 2015-08-20 ENCOUNTER — Encounter: Payer: Self-pay | Admitting: Registered Nurse

## 2015-08-20 VITALS — BP 125/73 | HR 84

## 2015-08-20 DIAGNOSIS — Z5181 Encounter for therapeutic drug level monitoring: Secondary | ICD-10-CM | POA: Diagnosis not present

## 2015-08-20 DIAGNOSIS — G894 Chronic pain syndrome: Secondary | ICD-10-CM

## 2015-08-20 DIAGNOSIS — M5417 Radiculopathy, lumbosacral region: Secondary | ICD-10-CM

## 2015-08-20 DIAGNOSIS — M5414 Radiculopathy, thoracic region: Secondary | ICD-10-CM | POA: Diagnosis not present

## 2015-08-20 DIAGNOSIS — M961 Postlaminectomy syndrome, not elsewhere classified: Secondary | ICD-10-CM

## 2015-08-20 DIAGNOSIS — Z79899 Other long term (current) drug therapy: Secondary | ICD-10-CM

## 2015-08-20 MED ORDER — MORPHINE SULFATE 15 MG PO TABS: 15.0000 mg | ORAL_TABLET | Freq: Three times a day (TID) | ORAL | Status: DC | PRN

## 2015-08-20 MED ORDER — MORPHINE SULFATE ER 15 MG PO TBCR: 15.0000 mg | EXTENDED_RELEASE_TABLET | Freq: Three times a day (TID) | ORAL | Status: DC

## 2015-08-20 NOTE — Progress Notes (Signed)
Subjective:    Patient ID: [name] [name], female    DOB: 1955/09/20, 60 y.o.   MRN: [id number]  HPI:[name] [name] is a 60 year old female who returns for follow up for chronic pain and medication refill.She says her pain is located in her lower back. She rates her pain 7. Her current exercise regime is walking, performing stretching and chair exercises.  Pain Inventory Average Pain 7 Pain Right Now 7 My pain is aching  In the last 24 hours, has pain interfered with the following? General activity 8 Relation with others 8 Enjoyment of life 8 What TIME of day is your pain at its worst? morning evening and night Sleep (in general) Fair  Pain is worse with: walking, bending, sitting, inactivity and standing Pain improves with: rest, heat/ice and medication Relief from Meds: 5  Mobility use a cane how many minutes can you walk? 10 ability to climb steps?  yes do you drive?  yes  Function disabled: date disabled 2009 I need assistance with the following:  household duties and shopping  Neuro/Psych No problems in this area  Prior Studies Any changes since last visit?  no  Physicians involved in your care Any changes since last visit?  no   Family History  Problem Relation Age of Onset  . Heart disease Mother   . Hypertension Mother    Social History   Social History  . Marital Status: Widowed    Spouse Name: N/A  . Number of Children: N/A  . Years of Education: N/A   Social History Main Topics  . Smoking status: Former Smoker -- 0.50 packs/day for 20 years    Types: Cigarettes    Quit date: 07/21/2008  . Smokeless tobacco: Never Used  . Alcohol Use: No  . Drug Use: No  . Sexual Activity: Not Asked   Other Topics Concern  . None   Social History Narrative   Past Surgical History  Procedure Laterality Date  . Spine surgery    . Abdominal hysterectomy    . Colon surgery  05/11/11    [facility] Lysis of adhesions Crohn's   Past Medical History    Diagnosis Date  . Disorder of sacroiliac joint   . Lumbosacral neuritis   . Lumbosacral radiculitis   . Lumbar post-laminectomy syndrome   . Sciatica   . Lumbago   . Hypertension   . Crohn's disease ([place])    BP 125/73 mmHg  Pulse 84  SpO2 97%  Opioid Risk Score:   Fall Risk Score:  `1  Depression screen PHQ 2/9  Depression screen [facility] 2/9 08/20/2015 07/23/2015 03/02/2015 02/02/2015 09/15/2014  Decreased Interest 0 3 3 3 3   Down, Depressed, Hopeless 0 0 0 0 0  PHQ - 2 Score 0 3 3 3 3   Altered sleeping 1 - - - 2  Tired, decreased energy 0 - - - 3  Change in appetite 1 - - - 3  Feeling bad or failure about yourself  0 - - - 1  Trouble concentrating 0 - - - 0  Moving slowly or fidgety/restless 0 - - - 0  Suicidal thoughts 0 - - - 0  PHQ-9 Score 2 - - - 12  Difficult doing work/chores Not difficult at all - - - -    Review of Systems  All other systems reviewed and are negative.      Objective:   Physical Exam  Constitutional: She is oriented to person, place, and time.  She appears well-developed and well-nourished.  HENT:  Head: Normocephalic and atraumatic.  Neck: Normal range of motion. Neck supple.  Cardiovascular: Normal rate and regular rhythm.   Pulmonary/Chest: Effort normal and breath sounds normal.  Musculoskeletal:  Normal Muscle Bulk and Muscle Testing Reveals: Upper Extremities: Full ROM and Muscle Strength 5/5 Lumbar Paraspinal Tenderness: L-3- L-5 Wearing Back Brace Lower Extremities: Full ROM and Muscle Strength 5/5 Arises from chair slowly Narrow Based Gait  Neurological: She is alert and oriented to person, place, and time.  Skin: Skin is warm and dry.  Psychiatric: She has a normal mood and affect.  Nursing note and vitals reviewed.         Assessment & Plan:  1.Low back pain with radiating symptoms in L3-4 distribution: continues to be limited by pain. Refilled: MS contin 15 mg 12 hr. tablet one tablet tid #90 with MSIR 15 mg one tablet  three times a day #90. Encouraged to Continue exercise regime.   20 minutes of face to face patient care time was spent during this visit. All questions were encouraged and answered.   F/U in 1 month

## 2015-09-11 ENCOUNTER — Other Ambulatory Visit: Payer: Self-pay | Admitting: Registered Nurse

## 2015-09-21 ENCOUNTER — Encounter: Payer: Medicare Other | Attending: Physical Medicine & Rehabilitation | Admitting: Registered Nurse

## 2015-09-21 ENCOUNTER — Encounter: Payer: Self-pay | Admitting: Registered Nurse

## 2015-09-21 VITALS — BP 124/72 | HR 60 | Resp 14

## 2015-09-21 DIAGNOSIS — G894 Chronic pain syndrome: Secondary | ICD-10-CM | POA: Diagnosis not present

## 2015-09-21 DIAGNOSIS — M5417 Radiculopathy, lumbosacral region: Secondary | ICD-10-CM | POA: Diagnosis present

## 2015-09-21 DIAGNOSIS — M5414 Radiculopathy, thoracic region: Secondary | ICD-10-CM | POA: Insufficient documentation

## 2015-09-21 DIAGNOSIS — M961 Postlaminectomy syndrome, not elsewhere classified: Secondary | ICD-10-CM | POA: Insufficient documentation

## 2015-09-21 DIAGNOSIS — Z5181 Encounter for therapeutic drug level monitoring: Secondary | ICD-10-CM

## 2015-09-21 DIAGNOSIS — Z79899 Other long term (current) drug therapy: Secondary | ICD-10-CM

## 2015-09-21 DIAGNOSIS — G8929 Other chronic pain: Secondary | ICD-10-CM

## 2015-09-21 DIAGNOSIS — M25561 Pain in right knee: Secondary | ICD-10-CM

## 2015-09-21 MED ORDER — MORPHINE SULFATE ER 15 MG PO TBCR: 15.0000 mg | EXTENDED_RELEASE_TABLET | Freq: Three times a day (TID) | ORAL | Status: DC

## 2015-09-21 MED ORDER — MORPHINE SULFATE 15 MG PO TABS: 15.0000 mg | ORAL_TABLET | Freq: Three times a day (TID) | ORAL | Status: DC | PRN

## 2015-09-21 NOTE — Progress Notes (Signed)
Subjective:    Patient ID: [name] [name], female    DOB: 24-Mar-1956, 60 y.o.   MRN: [id number]  HPI: [name] is a 60 year old female who returns for follow up for chronic pain and medication refill.She states her pain is located in her lower back and right knee. She rates her pain 7. Her current exercise regime is walking, performing stretching and chair exercises.  Pain Inventory Average Pain 6 Pain Right Now 7 My pain is sharp, burning and aching  In the last 24 hours, has pain interfered with the following? General activity 6 Relation with others 7 Enjoyment of life 7 What TIME of day is your pain at its worst? morning, evening, night Sleep (in general) Fair  Pain is worse with: bending, inactivity and standing Pain improves with: rest, heat/ice, pacing activities and medication Relief from Meds: 5  Mobility how many minutes can you walk? 15 ability to climb steps?  yes Do you have any goals in this area?  yes  Function disabled: date disabled 08/2007 I need assistance with the following:  household duties Do you have any goals in this area?  yes  Neuro/Psych trouble walking  Prior Studies Any changes since last visit?  no  Physicians involved in your care Any changes since last visit?  no   Family History  Problem Relation Age of Onset  . Heart disease Mother   . Hypertension Mother    Social History   Social History  . Marital Status: Widowed    Spouse Name: N/A  . Number of Children: N/A  . Years of Education: N/A   Social History Main Topics  . Smoking status: Former Smoker -- 0.50 packs/day for 20 years    Types: Cigarettes    Quit date: 07/21/2008  . Smokeless tobacco: Never Used  . Alcohol Use: No  . Drug Use: No  . Sexual Activity: Not Asked   Other Topics Concern  . None   Social History Narrative   Past Surgical History  Procedure Laterality Date  . Spine surgery    . Abdominal hysterectomy    . Colon surgery   05/11/11    [facility] Lysis of adhesions Crohn's   Past Medical History  Diagnosis Date  . Disorder of sacroiliac joint   . Lumbosacral neuritis   . Lumbosacral radiculitis   . Lumbar post-laminectomy syndrome   . Sciatica   . Lumbago   . Hypertension   . Crohn's disease ([place])    BP 124/72 mmHg  Pulse 60  Resp 14  SpO2 97%  Opioid Risk Score:   Fall Risk Score:  `1  Depression screen PHQ 2/9  Depression screen [facility] 2/9 08/20/2015 07/23/2015 03/02/2015 02/02/2015 09/15/2014  Decreased Interest 0 3 3 3 3   Down, Depressed, Hopeless 0 0 0 0 0  PHQ - 2 Score 0 3 3 3 3   Altered sleeping 1 - - - 2  Tired, decreased energy 0 - - - 3  Change in appetite 1 - - - 3  Feeling bad or failure about yourself  0 - - - 1  Trouble concentrating 0 - - - 0  Moving slowly or fidgety/restless 0 - - - 0  Suicidal thoughts 0 - - - 0  PHQ-9 Score 2 - - - 12  Difficult doing work/chores Not difficult at all - - - -     Review of Systems  Musculoskeletal: Positive for gait problem.  All other systems reviewed and  are negative.      Objective:   Physical Exam  Constitutional: She is oriented to person, place, and time. She appears well-developed and well-nourished.  HENT:  Head: Normocephalic and atraumatic.  Neck: Normal range of motion. Neck supple.  Cardiovascular: Normal rate and regular rhythm.   Pulmonary/Chest: Effort normal and breath sounds normal.  Musculoskeletal:  Normal Muscle Bulk and Muscle Testing Reveals: Upper Extremities: Full ROM and Muscle Strength 5/5 Lumbar Paraspinal Tenderness: L-4- L-5 Lower Extremities: Right: Decreased ROM and Muscle Strength 5/5 Right Lower Extremity Flexion Produces pain into Patella Right Patella with swelling noted Left: Full ROM and Muscle Strength 5/5 Arises from chair slowly Antalgic Gait   Neurological: She is alert and oriented to person, place, and time.  Skin: Skin is warm and dry.  Psychiatric: She has a normal mood and affect.    Nursing note and vitals reviewed.         Assessment & Plan:  1.Low back pain with radiating symptoms in L3-4 distribution: continues to be limited by pain. Refilled: MS contin 15 mg 12 hr. tablet one tablet tid #90 with MSIR 15 mg one tablet three times a day #90. Encouraged to Continue exercise regime.  2. Chronic Right Knee Pain: Continue with Ice Therapy, Voltaren Gel. She doesn't want to see an orthopaedic at this time. Will continue to monitor.  20 minutes of face to face patient care time was spent during this visit. All questions were encouraged and answered.   F/U in 1 month

## 2015-10-19 ENCOUNTER — Encounter: Payer: Medicare Other | Attending: Physical Medicine & Rehabilitation | Admitting: Registered Nurse

## 2015-10-19 ENCOUNTER — Encounter: Payer: Self-pay | Admitting: Registered Nurse

## 2015-10-19 VITALS — BP 118/59 | HR 69 | Resp 14

## 2015-10-19 DIAGNOSIS — G894 Chronic pain syndrome: Secondary | ICD-10-CM | POA: Diagnosis not present

## 2015-10-19 DIAGNOSIS — Z79899 Other long term (current) drug therapy: Secondary | ICD-10-CM

## 2015-10-19 DIAGNOSIS — M961 Postlaminectomy syndrome, not elsewhere classified: Secondary | ICD-10-CM | POA: Diagnosis not present

## 2015-10-19 DIAGNOSIS — M5414 Radiculopathy, thoracic region: Secondary | ICD-10-CM | POA: Diagnosis present

## 2015-10-19 DIAGNOSIS — M5417 Radiculopathy, lumbosacral region: Secondary | ICD-10-CM | POA: Insufficient documentation

## 2015-10-19 DIAGNOSIS — Z5181 Encounter for therapeutic drug level monitoring: Secondary | ICD-10-CM | POA: Diagnosis not present

## 2015-10-19 MED ORDER — MORPHINE SULFATE 15 MG PO TABS: 15.0000 mg | ORAL_TABLET | Freq: Three times a day (TID) | ORAL | Status: DC | PRN

## 2015-10-19 MED ORDER — MORPHINE SULFATE ER 15 MG PO TBCR: 15.0000 mg | EXTENDED_RELEASE_TABLET | Freq: Three times a day (TID) | ORAL | Status: DC

## 2015-10-19 NOTE — Progress Notes (Signed)
Subjective:    Patient ID: [name] [name], female    DOB: 09-Sep-1955, 60 y.o.   MRN: [id number]  HPI: [name] is a 60 year old female who returns for follow up for chronic pain and medication refill.She states her pain is located in her lower back. She rates her pain 7. Her current exercise regime is walking, performing stretching and chair exercises.  Pain Inventory Average Pain 6 Pain Right Now 7 My pain is constant, sharp and aching  In the last 24 hours, has pain interfered with the following? General activity 6 Relation with others 7 Enjoyment of life 8 What TIME of day is your pain at its worst? morning, evening, night Sleep (in general) Fair  Pain is worse with: bending, standing and some activites Pain improves with: rest, heat/ice and medication Relief from Meds: 5  Mobility walk without assistance how many minutes can you walk? 20 ability to climb steps?  yes do you drive?  yes Do you have any goals in this area?  yes  Function disabled: date disabled . Do you have any goals in this area?  yes  Neuro/Psych No problems in this area  Prior Studies Any changes since last visit?  no  Physicians involved in your care Any changes since last visit?  no   Family History  Problem Relation Age of Onset  . Heart disease Mother   . Hypertension Mother    Social History   Social History  . Marital Status: Widowed    Spouse Name: N/A  . Number of Children: N/A  . Years of Education: N/A   Social History Main Topics  . Smoking status: Former Smoker -- 0.50 packs/day for 20 years    Types: Cigarettes    Quit date: 07/21/2008  . Smokeless tobacco: Never Used  . Alcohol Use: No  . Drug Use: No  . Sexual Activity: Not Asked   Other Topics Concern  . None   Social History Narrative   Past Surgical History  Procedure Laterality Date  . Spine surgery    . Abdominal hysterectomy    . Colon surgery  05/11/11    [facility] L[facility] of adhesions  Crohn's   Past Medical History  Diagnosis Date  . Disorder of sacroiliac joint   . Lumbosacral neuritis   . Lumbosacral radiculitis   . Lumbar post-laminectomy syndrome   . Sciatica   . Lumbago   . Hypertension   . Crohn's disease ([place])    BP 118/59 mmHg  Pulse 69  Resp 14  SpO2 95%  Opioid Risk Score:   Fall Risk Score:  `1  Depression screen PHQ 2/9  Depression screen [facility] 2/9 08/20/2015 07/23/2015 03/02/2015 02/02/2015 09/15/2014  Decreased Interest 0 3 3 3 3   Down, Depressed, Hopeless 0 0 0 0 0  PHQ - 2 Score 0 3 3 3 3   Altered sleeping 1 - - - 2  Tired, decreased energy 0 - - - 3  Change in appetite 1 - - - 3  Feeling bad or failure about yourself  0 - - - 1  Trouble concentrating 0 - - - 0  Moving slowly or fidgety/restless 0 - - - 0  Suicidal thoughts 0 - - - 0  PHQ-9 Score 2 - - - 12  Difficult doing work/chores Not difficult at all - - - -     Review of Systems  All other systems reviewed and are negative.      Objective:  Physical Exam  Constitutional: She is oriented to person, place, and time. She appears well-developed and well-nourished.  HENT:  Head: Normocephalic and atraumatic.  Neck: Normal range of motion. Neck supple.  Cardiovascular: Normal rate and regular rhythm.   Pulmonary/Chest: Effort normal and breath sounds normal.  Musculoskeletal:  Normal Muscle Bulk and Muscle Testing Reveals: Upper Extremities: Full ROM and Muscle Strength 5/5 Thoracic Tightness: T-3- T-4 Mainly Left Side Lumbar Hypersensitivity Lower Extremities: Full ROM and Muscle Strength 5/5 Arises from chair slowly Antalgic gait  Neurological: She is alert and oriented to person, place, and time.  Skin: Skin is warm and dry.  Psychiatric: She has a normal mood and affect.  Nursing note and vitals reviewed.         Assessment & Plan:  1.Low back pain with radiating symptoms in L3-4 distribution: continues to be limited by pain. Refilled: MS contin 15 mg 12 hr.  tablet one tablet tid #90 with MSIR 15 mg one tablet three times a day #90. We will continue the opioid monitoring program, this consists of regular clinic visits, examinations, urine drug screen, pill counts as well as use of [place] Controlled Substance Reporting System. Encouraged to Continue exercise regime.  2. Chronic Right Knee Pain:No complaints today. Continue with Ice Therapy, Voltaren Gel.   20 minutes of face to face patient care time was spent during this visit. All questions were encouraged and answered.   F/U in 1 month

## 2015-10-19 NOTE — Addendum Note (Signed)
Addended by: Geryl Rankins D on: 10/19/2015 08:59 AM   Modules accepted: Orders

## 2015-10-23 ENCOUNTER — Other Ambulatory Visit: Payer: Self-pay | Admitting: Physical Medicine & Rehabilitation

## 2015-10-24 LAB — TOXASSURE SELECT,+ANTIDEPR,UR

## 2015-10-24 LAB — 6-ACETYLMORPHINE,TOXASSURE ADD
6-ACETYLMORPHINE: NEGATIVE
6-ACETYLMORPHINE: NOT DETECTED ng/mg{creat}

## 2015-10-27 NOTE — Progress Notes (Signed)
Urine drug screen for this encounter is consistent for prescribed medication 

## 2015-11-05 ENCOUNTER — Other Ambulatory Visit: Payer: Self-pay | Admitting: Registered Nurse

## 2015-11-19 ENCOUNTER — Encounter: Payer: Self-pay | Admitting: Registered Nurse

## 2015-11-19 ENCOUNTER — Encounter: Payer: Medicare Other | Attending: Physical Medicine & Rehabilitation | Admitting: Registered Nurse

## 2015-11-19 VITALS — BP 122/76 | HR 60

## 2015-11-19 DIAGNOSIS — M5414 Radiculopathy, thoracic region: Secondary | ICD-10-CM | POA: Insufficient documentation

## 2015-11-19 DIAGNOSIS — G894 Chronic pain syndrome: Secondary | ICD-10-CM

## 2015-11-19 DIAGNOSIS — Z5181 Encounter for therapeutic drug level monitoring: Secondary | ICD-10-CM

## 2015-11-19 DIAGNOSIS — M5417 Radiculopathy, lumbosacral region: Secondary | ICD-10-CM | POA: Insufficient documentation

## 2015-11-19 DIAGNOSIS — Z79899 Other long term (current) drug therapy: Secondary | ICD-10-CM

## 2015-11-19 DIAGNOSIS — M961 Postlaminectomy syndrome, not elsewhere classified: Secondary | ICD-10-CM | POA: Diagnosis not present

## 2015-11-19 MED ORDER — MORPHINE SULFATE 15 MG PO TABS: 15.0000 mg | ORAL_TABLET | Freq: Three times a day (TID) | ORAL | Status: DC | PRN

## 2015-11-19 MED ORDER — MORPHINE SULFATE ER 15 MG PO TBCR: 15.0000 mg | EXTENDED_RELEASE_TABLET | Freq: Three times a day (TID) | ORAL | Status: DC

## 2015-11-19 NOTE — Progress Notes (Signed)
Subjective:    Patient ID: [name] [name], female    DOB: 07/12/1955, 60 y.o.   MRN: [id number]  HPI: [name] is a 60 year old female who returns for follow up for chronic pain and medication refill.She states her pain is located in her lower back mainly left side. She rates her pain 7. Her current exercise regime is walking, performing stretching and chair exercises.  Pain Inventory Average Pain 6 Pain Right Now 7 My pain is constant, sharp and aching  In the last 24 hours, has pain interfered with the following? General activity 6 Relation with others 7 Enjoyment of life 7 What TIME of day is your pain at its worst? ALL Sleep (in general) Fair  Pain is worse with: bending and standing Pain improves with: heat/ice and medication Relief from Meds: 5  Mobility how many minutes can you walk? 15 ability to climb steps?  yes do you drive?  yes Do you have any goals in this area?  yes  Function disabled: date disabled 08-2006 I need assistance with the following:  household duties Do you have any goals in this area?  yes  Neuro/Psych trouble walking  Prior Studies Any changes since last visit?  no  Physicians involved in your care Any changes since last visit?  no   Family History  Problem Relation Age of Onset  . Heart disease Mother   . Hypertension Mother    Social History   Social History  . Marital Status: Widowed    Spouse Name: N/A  . Number of Children: N/A  . Years of Education: N/A   Social History Main Topics  . Smoking status: Former Smoker -- 0.50 packs/day for 20 years    Types: Cigarettes    Quit date: 07/21/2008  . Smokeless tobacco: Never Used  . Alcohol Use: No  . Drug Use: No  . Sexual Activity: Not on file   Other Topics Concern  . Not on file   Social History Narrative   Past Surgical History  Procedure Laterality Date  . Spine surgery    . Abdominal hysterectomy    . Colon surgery  05/11/11    [facility] Lysis of  adhesions Crohn's   Past Medical History  Diagnosis Date  . Disorder of sacroiliac joint   . Lumbosacral neuritis   . Lumbosacral radiculitis   . Lumbar post-laminectomy syndrome   . Sciatica   . Lumbago   . Hypertension   . Crohn's disease ([place])    There were no vitals taken for this visit.  Opioid Risk Score:   Fall Risk Score:  `1  Depression screen PHQ 2/9  Depression screen [facility] 2/9 08/20/2015 07/23/2015 03/02/2015 02/02/2015 09/15/2014  Decreased Interest 0 3 3 3 3   Down, Depressed, Hopeless 0 0 0 0 0  PHQ - 2 Score 0 3 3 3 3   Altered sleeping 1 - - - 2  Tired, decreased energy 0 - - - 3  Change in appetite 1 - - - 3  Feeling bad or failure about yourself  0 - - - 1  Trouble concentrating 0 - - - 0  Moving slowly or fidgety/restless 0 - - - 0  Suicidal thoughts 0 - - - 0  PHQ-9 Score 2 - - - 12  Difficult doing work/chores Not difficult at all - - - -       Review of Systems  All other systems reviewed and are negative.  Objective:   Physical Exam  Constitutional: She is oriented to person, place, and time. She appears well-developed and well-nourished.  HENT:  Head: Normocephalic and atraumatic.  Neck: Normal range of motion. Neck supple.  Cardiovascular: Normal rate and regular rhythm.   Pulmonary/Chest: Effort normal and breath sounds normal.  Musculoskeletal:  Normal Muscle Bulk and Muscle Testing Reveals: Upper Extremities: Full ROM and Muscle Strength 5/5 Lumbar Paraspinal Tenderness: L-3- L-5 Mainly Left Side Lower Extremities: Full ROM and Muscle Strength 5/5 Arises from chair slowly Antalgic Gait  Neurological: She is alert and oriented to person, place, and time.  Skin: Skin is warm and dry.  Psychiatric: She has a normal mood and affect.  Nursing note and vitals reviewed.         Assessment & Plan:  1.Low back pain with radiating symptoms in L3-4 distribution: continues to be limited by pain. Refilled: MS contin 15 mg 12 hr. tablet one  tablet tid #90 with MSIR 15 mg one tablet three times a day #90. We will continue the opioid monitoring program, this consists of regular clinic visits, examinations, urine drug screen, pill counts as well as use of [place] Controlled Substance Reporting System. Encouraged to Continue exercise regime.  2. Chronic Right Knee Pain:No complaints today. Continue with Ice Therapy, Voltaren Gel.   20 minutes of face to face patient care time was spent during this visit. All questions were encouraged and answered.   F/U in 1 month

## 2015-12-21 ENCOUNTER — Encounter: Payer: Medicare Other | Attending: Physical Medicine & Rehabilitation | Admitting: Registered Nurse

## 2015-12-21 ENCOUNTER — Encounter: Payer: Self-pay | Admitting: Registered Nurse

## 2015-12-21 VITALS — BP 120/77 | HR 80 | Resp 14

## 2015-12-21 DIAGNOSIS — M5414 Radiculopathy, thoracic region: Secondary | ICD-10-CM | POA: Diagnosis not present

## 2015-12-21 DIAGNOSIS — M5417 Radiculopathy, lumbosacral region: Secondary | ICD-10-CM | POA: Insufficient documentation

## 2015-12-21 DIAGNOSIS — G894 Chronic pain syndrome: Secondary | ICD-10-CM | POA: Diagnosis not present

## 2015-12-21 DIAGNOSIS — M961 Postlaminectomy syndrome, not elsewhere classified: Secondary | ICD-10-CM | POA: Diagnosis not present

## 2015-12-21 DIAGNOSIS — Z79899 Other long term (current) drug therapy: Secondary | ICD-10-CM

## 2015-12-21 DIAGNOSIS — Z5181 Encounter for therapeutic drug level monitoring: Secondary | ICD-10-CM

## 2015-12-21 DIAGNOSIS — M7062 Trochanteric bursitis, left hip: Secondary | ICD-10-CM

## 2015-12-21 MED ORDER — MORPHINE SULFATE 15 MG PO TABS: 15.0000 mg | ORAL_TABLET | Freq: Three times a day (TID) | ORAL | Status: DC | PRN

## 2015-12-21 MED ORDER — MORPHINE SULFATE ER 15 MG PO TBCR: 15.0000 mg | EXTENDED_RELEASE_TABLET | Freq: Three times a day (TID) | ORAL | Status: DC

## 2015-12-21 NOTE — Patient Instructions (Signed)
Ms. Seaberg wants to come off Gabapentin:   We will decrease Gabapentin to Three Times a Day   Call Office in Two Weeks  [place]

## 2015-12-21 NOTE — Progress Notes (Signed)
Subjective:    Patient ID: [name] [name], female    DOB: 03/21/1956, 60 y.o.   MRN: [id number]  HPI: [name] [name] is a 60 year old female who returns for follow up for chronic pain and medication refill.She states her pain is located in her lower back mainly left side and left hip occassionally. She rates her pain 6. States when she stands for long periods her pain intensifies in her lower back. Educated on moderation with physical activity and housework, she verbalizes understanding.  Her current exercise regime is walking, performing stretching and chair exercises. Also states she wants to come off the gabapentin due to side effects of weight gain. We will decrease her gabapentin to TID, she was instructed to call office in two weeks.   Pain Inventory Average Pain 5 Pain Right Now 6 My pain is constant and aching  In the last 24 hours, has pain interfered with the following? General activity 6 Relation with others 5 Enjoyment of life 6 What TIME of day is your pain at its worst? evening Sleep (in general) Fair  Pain is worse with: walking, standing and some activites Pain improves with: rest, heat/ice and medication Relief from Meds: 5  Mobility ability to climb steps?  yes do you drive?  yes Do you have any goals in this area?  yes  Function disabled: date disabled 10/2007 I need assistance with the following:  household duties Do you have any goals in this area?  yes  Neuro/Psych No problems in this area  Prior Studies Any changes since last visit?  no  Physicians involved in your care Any changes since last visit?  no   Family History  Problem Relation Age of Onset  . Heart disease Mother   . Hypertension Mother    Social History   Social History  . Marital Status: Widowed    Spouse Name: N/A  . Number of Children: N/A  . Years of Education: N/A   Social History Main Topics  . Smoking status: Former Smoker -- 0.50 packs/day for 20 years   Types: Cigarettes    Quit date: 07/21/2008  . Smokeless tobacco: Never Used  . Alcohol Use: No  . Drug Use: No  . Sexual Activity: Not Asked   Other Topics Concern  . None   Social History Narrative   Past Surgical History  Procedure Laterality Date  . Spine surgery    . Abdominal hysterectomy    . Colon surgery  05/11/11    [facility] Lysis of adhesions Crohn's   Past Medical History  Diagnosis Date  . Disorder of sacroiliac joint   . Lumbosacral neuritis   . Lumbosacral radiculitis   . Lumbar post-laminectomy syndrome   . Sciatica   . Lumbago   . Hypertension   . Crohn's disease ([place])    BP 120/77 mmHg  Pulse 80  Resp 14  SpO2 93%  Opioid Risk Score:   Fall Risk Score:  `1  Depression screen PHQ 2/9  Depression screen [facility] 2/9 12/21/2015 11/19/2015 08/20/2015 07/23/2015 03/02/2015 02/02/2015 09/15/2014  Decreased Interest 0 0 0 3 3 3 3   Down, Depressed, Hopeless 0 0 0 0 0 0 0  PHQ - 2 Score 0 0 0 3 3 3 3   Altered sleeping - - 1 - - - 2  Tired, decreased energy - - 0 - - - 3  Change in appetite - - 1 - - - 3  Feeling bad or failure about  yourself  - - 0 - - - 1  Trouble concentrating - - 0 - - - 0  Moving slowly or fidgety/restless - - 0 - - - 0  Suicidal thoughts - - 0 - - - 0  PHQ-9 Score - - 2 - - - 12  Difficult doing work/chores - - Not difficult at all - - - -       Review of Systems  Constitutional: Positive for unexpected weight change.  Respiratory: Negative.   Cardiovascular: Negative.   Gastrointestinal: Positive for nausea.  Endocrine: Negative.   Genitourinary: Negative.   Musculoskeletal: Negative.   Psychiatric/Behavioral: Negative.   All other systems reviewed and are negative.      Objective:   Physical Exam  Constitutional: She is oriented to person, place, and time. She appears well-developed and well-nourished.  HENT:  Head: Normocephalic and atraumatic.  Neck: Normal range of motion. Neck supple.  Cardiovascular: Normal rate and regular  rhythm.   Pulmonary/Chest: Effort normal and breath sounds normal.  Musculoskeletal:  Normal Muscle Bulk and Muscle Testing Reveals: Upper Extremities: Full ROM and Muscle Strength 5/5 Lumbar Paraspinal Tenderness: L-4- L-5 Lower Extremities: Full ROM and Muscle Strength 5/5 Arises from Table Slowly Antalgic Gait  Neurological: She is alert and oriented to person, place, and time.  Skin: Skin is warm and dry.  Psychiatric: She has a normal mood and affect.  Nursing note and vitals reviewed.         Assessment & Plan:  1.Low back pain with radiating symptoms in L3-4 distribution: continues to be limited by pain. Refilled: MS contin 15 mg 12 hr. tablet one tablet tid #90 with MSIR 15 mg one tablet three times a day #90. We will continue the opioid monitoring program, this consists of regular clinic visits, examinations, urine drug screen, pill counts as well as use of [place] Controlled Substance Reporting System. Encouraged to Continue exercise regime.  2. Lumbar Radiculopathy: Continue Gabapentin: Decreased TID per patient's Request. 3. Chronic Right Knee Pain:No complaints today. Continue with Ice Therapy, Voltaren Gel.   20 minutes of face to face patient care time was spent during this visit. All questions were encouraged and answered.   F/U in 1 month

## 2015-12-29 ENCOUNTER — Other Ambulatory Visit: Payer: Self-pay | Admitting: Registered Nurse

## 2016-01-07 ENCOUNTER — Telehealth: Payer: Self-pay | Admitting: Physical Medicine & Rehabilitation

## 2016-01-07 NOTE — Telephone Encounter (Signed)
Spoke with [name] [name] she is tolerating the Gabapentin TID, also states she seen her surgeon today, and may have to be scheduled for surgery. When she comes  For her appointment on 01/19/16, she will have more details.

## 2016-01-07 NOTE — Telephone Encounter (Signed)
Patient was told to call [name] [name] back in 2 weeks after her last visit.

## 2016-01-15 ENCOUNTER — Ambulatory Visit (INDEPENDENT_AMBULATORY_CARE_PROVIDER_SITE_OTHER): Payer: Medicare Other | Admitting: Urgent Care

## 2016-01-15 VITALS — BP 128/80 | HR 80 | Temp 98.2°F | Resp 17 | Ht 64.5 in | Wt 224.0 lb

## 2016-01-15 DIAGNOSIS — I10 Essential (primary) hypertension: Secondary | ICD-10-CM | POA: Diagnosis not present

## 2016-01-15 DIAGNOSIS — Z9889 Other specified postprocedural states: Secondary | ICD-10-CM

## 2016-01-15 DIAGNOSIS — M5417 Radiculopathy, lumbosacral region: Secondary | ICD-10-CM | POA: Diagnosis not present

## 2016-01-15 DIAGNOSIS — Z79890 Hormone replacement therapy (postmenopausal): Secondary | ICD-10-CM

## 2016-01-15 DIAGNOSIS — G8929 Other chronic pain: Secondary | ICD-10-CM | POA: Diagnosis not present

## 2016-01-15 DIAGNOSIS — M549 Dorsalgia, unspecified: Secondary | ICD-10-CM | POA: Diagnosis not present

## 2016-01-15 LAB — COMPLETE METABOLIC PANEL WITH GFR
ALBUMIN: 4.2 g/dL (ref 3.6–5.1)
ALK PHOS: 82 U/L (ref 33–130)
ALT: 28 U/L (ref 6–29)
AST: 28 U/L (ref 10–35)
BUN: 10 mg/dL (ref 7–25)
CALCIUM: 9.3 mg/dL (ref 8.6–10.4)
CHLORIDE: 101 mmol/L (ref 98–110)
CO2: 27 mmol/L (ref 20–31)
CREATININE: 0.99 mg/dL (ref 0.50–1.05)
GFR, Est African American: 72 mL/min (ref >=60)
GFR, Est Non African American: 63 mL/min (ref >=60)
GLUCOSE: 115 mg/dL — AB (ref 65–99)
POTASSIUM: 3.2 mmol/L — AB (ref 3.5–5.3)
SODIUM: 140 mmol/L (ref 135–146)
Total Bilirubin: 0.6 mg/dL (ref 0.2–1.2)
Total Protein: 7.6 g/dL (ref 6.1–8.1)

## 2016-01-15 MED ORDER — HYDROCHLOROTHIAZIDE 25 MG PO TABS: 25.0000 mg | ORAL_TABLET | Freq: Every day | ORAL | 3 refills | Status: DC

## 2016-01-15 MED ORDER — CONJ ESTROG-MEDROXYPROGEST ACE 0.3-1.5 MG PO TABS: 1.0000 | ORAL_TABLET | ORAL | 1 refills | Status: DC

## 2016-01-15 NOTE — Progress Notes (Signed)
    MRN: [id number] DOB: 01-01-1956  Subjective:   [name] [name] is a 60 y.o. female presenting for follow up on HTN, Post-menopause.   HTN - managed well with HCTZ. She has been seen by a cardiologist in 2012, had a work up that was negative for heart disease. Admits that she occasionally has palpitations with associated dizziness. Also has occasional lower leg swelling. Denies chronic headache, blurred vision, chest pain, shortness of breath, heart racing, palpitations, nausea, vomiting, hematuria.   Menopause - Has been on HRT since 2008 and is currently in the process of coming off of the medication. She is currently taking it every other day. Has tried to come off but has only been able to do 3 days since her hot flashes are very difficult on her.  Chronic Pain - Patient is being managed for chronic back pain s/p 4 back surgeries. She is scheduled to go to chronic pain clinic on 01/15/2016. She recently switched to [facility] and is now required to have a referral in order to go Dr. Letta Pate.  [name] [name] has a current medication list which includes the following prescription(s): atorvastatin, diclofenac sodium, estrogen (conjugated)-medroxyprogesterone, fluticasone, gabapentin, hydrochlorothiazide, morphine, morphine, promethazine, and venlafaxine xr. Also is allergic to orudis [ketoprofen].  [name] [name]  has a past medical history of Crohn's disease ([place]); Disorder of sacroiliac joint; Hypertension; Lumbago; Lumbar post-laminectomy syndrome; Lumbosacral neuritis; Lumbosacral radiculitis; and Sciatica. Also  has a past surgical history that includes Spine surgery; Abdominal hysterectomy; and Colon surgery (05/11/11).  Objective:   Vitals: BP 128/80 (BP Location: Right Arm, Patient Position: Sitting, Cuff Size: Large)   Pulse 80   Temp 98.2 F (36.8 C) (Oral)   Resp 17   Ht 5' 4.5" (1.638 m)   Wt 224 lb (101.6 kg)   SpO2 98%   BMI 37.86 kg/m   BP Readings from Last 3  Encounters:  01/15/16 128/80  12/21/15 120/77  11/19/15 122/76   Physical Exam  Constitutional: She is oriented to person, place, and time. She appears well-developed and well-nourished.  HENT:  Mouth/Throat: Oropharynx is clear and moist.  Eyes: No scleral icterus.  Cardiovascular: Normal rate, regular rhythm and intact distal pulses.  Exam reveals no gallop and no friction rub.   No murmur heard. Pulmonary/Chest: No respiratory distress. She has no wheezes. She has no rales.  Neurological: She is alert and oriented to person, place, and time.  Skin: Skin is warm and dry.   Assessment and Plan :   1. Essential hypertension - Well controlled, refilled HCTZ. Continue healthy diet and exercise. Follow up in 6 months.  2. On postmenopausal hormone replacement therapy - Discussed risks of use of HRT more than 5 years including MI, clots, cancer. She verbalized understanding and will continue to work toward coming off of Prempro with her PCP.  3. Chronic back pain 4. Lumbosacral neuritis 5. History of back surgery - Ambulatory referral to [place], PA-C [facility] [phone] 01/15/2016 3:01 PM

## 2016-01-15 NOTE — Patient Instructions (Addendum)
Hypertension Hypertension, commonly called high blood pressure, is when the force of blood pumping through your arteries is too strong. Your arteries are the blood vessels that carry blood from your heart throughout your body. A blood pressure reading consists of a higher number over a lower number, such as 110/72. The higher number (systolic) is the pressure inside your arteries when your heart pumps. The lower number (diastolic) is the pressure inside your arteries when your heart relaxes. Ideally you want your blood pressure below 120/80. Hypertension forces your heart to work harder to pump blood. Your arteries may become narrow or stiff. Having untreated or uncontrolled hypertension can cause heart attack, stroke, kidney disease, and other problems. RISK FACTORS Some risk factors for high blood pressure are controllable. Others are not.  Risk factors you cannot control include:   Race. You may be at higher risk if you are African American.  Age. Risk increases with age.  Gender. Men are at higher risk than women before age 45 years. After age 65, women are at higher risk than men. Risk factors you can control include:  Not getting enough exercise or physical activity.  Being overweight.  Getting too much fat, sugar, calories, or salt in your diet.  Drinking too much alcohol. SIGNS AND SYMPTOMS Hypertension does not usually cause signs or symptoms. Extremely high blood pressure (hypertensive crisis) may cause headache, anxiety, shortness of breath, and nosebleed. DIAGNOSIS To check if you have hypertension, your health care provider will measure your blood pressure while you are seated, with your arm held at the level of your heart. It should be measured at least twice using the same arm. Certain conditions can cause a difference in blood pressure between your right and left arms. A blood pressure reading that is higher than normal on one occasion does not mean that you need treatment. If  it is not clear whether you have high blood pressure, you may be asked to return on a different day to have your blood pressure checked again. Or, you may be asked to monitor your blood pressure at home for 1 or more weeks. TREATMENT Treating high blood pressure includes making lifestyle changes and possibly taking medicine. Living a healthy lifestyle can help lower high blood pressure. You may need to change some of your habits. Lifestyle changes may include:  Following the DASH diet. This diet is high in fruits, vegetables, and whole grains. It is low in salt, red meat, and added sugars.  Keep your sodium intake below 2,300 mg per day.  Getting at least 30-45 minutes of aerobic exercise at least 4 times per week.  Losing weight if necessary.  Not smoking.  Limiting alcoholic beverages.  Learning ways to reduce stress. Your health care provider may prescribe medicine if lifestyle changes are not enough to get your blood pressure under control, and if one of the following is true:  You are 18-59 years of age and your systolic blood pressure is above 140.  You are 60 years of age or older, and your systolic blood pressure is above 150.  Your diastolic blood pressure is above 90.  You have diabetes, and your systolic blood pressure is over 140 or your diastolic blood pressure is over 90.  You have kidney disease and your blood pressure is above 140/90.  You have heart disease and your blood pressure is above 140/90. Your personal target blood pressure may vary depending on your medical conditions, your age, and other factors. HOME CARE INSTRUCTIONS    Have your blood pressure rechecked as directed by your health care provider.   Take medicines only as directed by your health care provider. Follow the directions carefully. Blood pressure medicines must be taken as prescribed. The medicine does not work as well when you skip doses. Skipping doses also puts you at risk for  problems.  Do not smoke.   Monitor your blood pressure at home as directed by your health care provider. SEEK MEDICAL CARE IF:   You think you are having a reaction to medicines taken.  You have recurrent headaches or feel dizzy.  You have swelling in your ankles.  You have trouble with your vision. SEEK IMMEDIATE MEDICAL CARE IF:  You develop a severe headache or confusion.  You have unusual weakness, numbness, or feel faint.  You have severe chest or abdominal pain.  You vomit repeatedly.  You have trouble breathing. MAKE SURE YOU:   Understand these instructions.  Will watch your condition.  Will get help right away if you are not doing well or get worse.   This information is not intended to replace advice given to you by your health care provider. Make sure you discuss any questions you have with your health care provider.   Document Released: 06/06/2005 Document Revised: 10/21/2014 Document Reviewed: 03/29/2013 Elsevier Interactive Patient Education 2016 Elsevier Inc.    Conjugated Estrogens; Medroxyprogesterone tablets What is this medicine? CONJUGATED ESTROGENS; MEDROXYPROGESTERONE (CON ju gate ed ESS troe jenz; me DROX ee proe JES te rone) is used as hormone replacement in menopausal women who still have their uterus. This medicine helps to treat hot flashes and prevent osteoporosis (weak bones). This medicine may be used for other purposes; ask your health care provider or pharmacist if you have questions. What should I tell my health care provider before I take this medicine? They need to know if you have any of these conditions: -blood vessel disease or blood clots -breast, cervical, endometrial, or uterine cancer -diabetes -endometriosis -fibroids -gallbladder disease -heart disease or recent heart attack -high blood cholesterol -high blood pressure -high level of calcium in the blood -hysterectomy -kidney disease -liver disease -mental  depression -migraine headaches -porphyria -protein C deficiency -protein S deficiency -stroke -systemic lupus erythematosus (SLE) -tobacco smoker -vaginal bleeding -an unusual or allergic reaction to estrogens, progestiins, other medicines, foods, dyes, or preservatives -pregnant or trying to get pregnant -breast-feeding How should I use this medicine? Take this medicine by mouth with a drink of water. You may take this medicine with food. Follow the directions on the prescription label. You will take one tablet daily at roughly the same time each day. Do not take your medicine more often than directed. Talk to your pediatrician regarding the use of this medicine in children. Special care may be needed. A patient package insert for the product will be given with each prescription and refill. Read this sheet carefully each time. The sheet may change frequently. Overdosage: If you think you have taken too much of this medicine contact a poison control center or emergency room at once. NOTE: This medicine is only for you. Do not share this medicine with others. What if I miss a dose? If you miss a dose, take it as soon as you can. If it is almost time for your next dose, take only that dose. Do not take double or extra doses. What may interact with this medicine? -barbiturates, such as phenobarbital -benzodiazepines -bosentan -bromocriptine -carbamazepine -cimetidine -cyclosporine -dantrolene -grapefruit juice -griseofulvin -hydrocortisone, cortisone,  or prednisolone -isoniazid (INH) -medications for diabetes -methotrexate -mineral oil -phenytoin -raloxifene -rifabutin, rifampin, or rifapentine -tamoxifen -thyroid hormones -topiramate -tricyclic antidepressants -warfarin This list may not describe all possible interactions. Give your health care provider a list of all the medicines, herbs, non-prescription drugs, or dietary supplements you use. Also tell them if you smoke,  drink alcohol, or use illegal drugs. Some items may interact with your medicine. What should I watch for while using this medicine? Visit your health care professional for regular checks on your progress. You will need a regular breast and pelvic exam. You should also discuss the need for regular mammograms with your health care professional, and follow his or her guidelines. This medicine can make your body retain fluid, making your fingers, hands, or ankles swell. Your blood pressure can go up. Contact your doctor or health care professional if you feel you are retaining fluid. If you have any reason to think you are pregnant; stop taking this medicine at once and contact your doctor or health care professional. Tobacco smoking increases the risk of getting a blood clot or having a stroke, especially if you are more than 59 years old. You are strongly advised not to smoke. If you wear contact lenses and notice visual changes, or if the lenses begin to feel uncomfortable, consult your eye care specialist. If you are going to have elective surgery, you may need to stop taking this medicine beforehand. Consult your health care professional for advice prior to scheduling the surgery. What side effects may I notice from receiving this medicine? Side effects that you should report to your doctor or health care professional as soon as possible: -allergic reactions like skin rash, itching or hives, swelling of the face, lips, or tongue -breast tissue changes or discharge -changes in vision -chest pain -confusion, trouble speaking or understanding -dark urine -general ill feeling or flu-like symptoms -light-colored stools -nausea, vomiting -pain, swelling, warmth in the leg -right upper belly pain -severe headaches -shortness of breath -sudden numbness or weakness of the face, arm or leg -trouble walking, dizziness, loss of balance or coordination -unusual vaginal bleeding -yellowing of the eyes  or skin Side effects that usually do not require medical attention (report to your doctor or health care professional if they continue or are bothersome): -hair loss -increased hunger or thirst -increased urination -symptoms of vaginal infection like itching, irritation or unusual discharge -unusually weak or tired This list may not describe all possible side effects. Call your doctor for medical advice about side effects. You may report side effects to FDA at 1-800-FDA-1088. Where should I keep my medicine? Keep out of the reach of children. Store at room temperature between 15 and 30 degrees C (59 and 86 degrees F). Throw away any unused medicine after the expiration date. NOTE: This sheet is a summary. It may not cover all possible information. If you have questions about this medicine, talk to your doctor, pharmacist, or health care provider.    2016, Elsevier/Gold Standard. (2010-09-08 09:20:18)     IF you received an x-ray today, you will receive an invoice from [facility]. Please contact [facility] at [phone] with questions or concerns regarding your invoice.   IF you received labwork today, you will receive an invoice from [employer]. Please contact [facility] at [phone] with questions or concerns regarding your invoice.   Our billing staff will not be able to assist you with questions regarding bills from these companies.  You will be  contacted with the lab results as soon as they are available. The fastest way to get your results is to activate your My Chart account. Instructions are located on the last page of this paperwork. If you have not heard from [place] regarding the results in 2 weeks, please contact this office.    We recommend that you schedule a mammogram for breast cancer screening. Typically, you do not need a referral to do this. Please contact a local imaging center to schedule your mammogram.  [facility] - [phone]  *ask for the Radiology Department [facility] [place] [facility] - [phone] or [phone]  [facility] - [phone] [place] [phone] [facility] - [phone]  *ask for the [facility] - [phone]  *ask for the Radiology Department [facility] - [phone]  *ask for the [facility] - [phone]

## 2016-01-19 ENCOUNTER — Encounter: Payer: Medicare Other | Attending: Physical Medicine & Rehabilitation | Admitting: Registered Nurse

## 2016-01-19 ENCOUNTER — Encounter: Payer: Self-pay | Admitting: Registered Nurse

## 2016-01-19 VITALS — BP 154/84 | HR 62 | Temp 98.2°F

## 2016-01-19 DIAGNOSIS — M961 Postlaminectomy syndrome, not elsewhere classified: Secondary | ICD-10-CM | POA: Diagnosis not present

## 2016-01-19 DIAGNOSIS — M545 Low back pain, unspecified: Secondary | ICD-10-CM

## 2016-01-19 DIAGNOSIS — Z79899 Other long term (current) drug therapy: Secondary | ICD-10-CM

## 2016-01-19 DIAGNOSIS — M5414 Radiculopathy, thoracic region: Secondary | ICD-10-CM | POA: Diagnosis not present

## 2016-01-19 DIAGNOSIS — G894 Chronic pain syndrome: Secondary | ICD-10-CM

## 2016-01-19 DIAGNOSIS — M5417 Radiculopathy, lumbosacral region: Secondary | ICD-10-CM

## 2016-01-19 DIAGNOSIS — Z5181 Encounter for therapeutic drug level monitoring: Secondary | ICD-10-CM | POA: Diagnosis not present

## 2016-01-19 MED ORDER — MORPHINE SULFATE ER 15 MG PO TBCR: 15.0000 mg | EXTENDED_RELEASE_TABLET | Freq: Three times a day (TID) | ORAL | 0 refills | Status: DC

## 2016-01-19 MED ORDER — MORPHINE SULFATE 15 MG PO TABS: 15.0000 mg | ORAL_TABLET | Freq: Three times a day (TID) | ORAL | 0 refills | Status: DC | PRN

## 2016-01-19 NOTE — Progress Notes (Signed)
Subjective:    Patient ID: [name] [name], female    DOB: Jul 03, 1955, 60 y.o.   MRN: [id number]  HPI: [name] is a 60 year old female who returns for follow up for chronic pain and medication refill.She states her pain is located in her lower back mainly left side. She rates her pain 8.  Her current exercise regime is walking, performing stretching and chair exercises.  Pain Inventory Average Pain 7 Pain Right Now 8 My pain is constant, sharp and aching  In the last 24 hours, has pain interfered with the following? General activity 8 Relation with others 8 Enjoyment of life 8 What TIME of day is your pain at its worst? morning evening and night Sleep (in general) Fair  Pain is worse with: walking, bending, sitting, inactivity and standing Pain improves with: rest, heat/ice and medication Relief from Meds: 4  Mobility how many minutes can you walk? 8 ability to climb steps?  yes do you drive?  yes  Function disabled: date disabled 08/2007  Neuro/Psych trouble walking  Prior Studies Any changes since last visit?  yes  Will need back surgery again for crack in hardware  Physicians involved in your care Any changes since last visit?  no   Family History  Problem Relation Age of Onset  . Heart disease Mother   . Hypertension Mother    Social History   Social History  . Marital status: Widowed    Spouse name: N/A  . Number of children: N/A  . Years of education: N/A   Social History Main Topics  . Smoking status: Former Smoker    Packs/day: 0.50    Years: 20.00    Types: Cigarettes    Quit date: 07/21/2008  . Smokeless tobacco: Never Used  . Alcohol use No  . Drug use: No  . Sexual activity: Not Asked   Other Topics Concern  . None   Social History Narrative  . None   Past Surgical History:  Procedure Laterality Date  . ABDOMINAL HYSTERECTOMY    . COLON SURGERY  05/11/11   Duke Lysis of adhesions Crohn's  . SPINE SURGERY     Past  Medical History:  Diagnosis Date  . Crohn's disease ([place])   . Disorder of sacroiliac joint   . Hypertension   . Lumbago   . Lumbar post-laminectomy syndrome   . Lumbosacral neuritis   . Lumbosacral radiculitis   . Sciatica    BP (!) 154/84 (BP Location: Left Arm, Patient Position: Sitting, Cuff Size: Large)   Pulse 62   Temp 98.2 F (36.8 C) (Oral)   SpO2 96%   Opioid Risk Score:   Fall Risk Score:  `1  Depression screen PHQ 2/9  Depression screen [facility] 2/9 01/19/2016 01/15/2016 12/21/2015 11/19/2015 08/20/2015 07/23/2015 03/02/2015  Decreased Interest - 0 0 0 0 3 3  Down, Depressed, Hopeless - 0 0 0 0 0 0  PHQ - 2 Score - 0 0 0 0 3 3  Altered sleeping 0 - - - 1 - -  Tired, decreased energy 0 - - - 0 - -  Change in appetite - - - - 1 - -  Feeling bad or failure about yourself  - - - - 0 - -  Trouble concentrating - - - - 0 - -  Moving slowly or fidgety/restless - - - - 0 - -  Suicidal thoughts - - - - 0 - -  PHQ-9 Score - - - -  2 - -  Difficult doing work/chores - - - - Not difficult at all - -    Review of Systems  Constitutional: Positive for unexpected weight change.  All other systems reviewed and are negative.      Objective:   Physical Exam  Constitutional: She is oriented to person, place, and time. She appears well-developed and well-nourished.  HENT:  Head: Normocephalic and atraumatic.  Neck: Normal range of motion. Neck supple.  Cardiovascular: Normal rate and regular rhythm.   Pulmonary/Chest: Effort normal and breath sounds normal.  Musculoskeletal:  Normal Muscle Bulk and Muscle Testing Reveals: Upper Extremities: Full ROM and MuscleStrength 5/5 Lumbar Paraspinal Tenderness: L-4- L-5 Mainly Left Side Lower Extremities: Full ROM and Muscle Strength 5/5 Arises from chair slowly Antalgic gait   Neurological: She is alert and oriented to person, place, and time.  Skin: Skin is warm and dry.  Psychiatric: She has a normal mood and affect.  Nursing note and  vitals reviewed.         Assessment & Plan:  1.Low back pain with radiating symptoms in L3-4 distribution: continues to be limited by pain. Refilled: MS contin 15 mg 12 hr. tablet one tablet tid #90 with MSIR 15 mg one tablet three times a day #90. We will continue the opioid monitoring program, this consists of regular clinic visits, examinations, urine drug screen, pill counts as well as use of [place] Controlled Substance Reporting System. Encouraged to Continue exercise regime.  2. Lumbar Radiculopathy: Continue Gabapentin  3. Chronic Right Knee Pain:No complaints today. Continue with Ice Therapy, Voltaren Gel.   20 minutes of face to face patient care time was spent during this visit. All questions were encouraged and answered.   F/U in 1 month

## 2016-01-20 ENCOUNTER — Other Ambulatory Visit: Payer: Self-pay | Admitting: Urgent Care

## 2016-01-20 DIAGNOSIS — I10 Essential (primary) hypertension: Secondary | ICD-10-CM

## 2016-01-20 MED ORDER — HYDROCHLOROTHIAZIDE 25 MG PO TABS: 25.0000 mg | ORAL_TABLET | Freq: Every day | ORAL | 3 refills | Status: DC

## 2016-02-19 ENCOUNTER — Encounter: Payer: Self-pay | Admitting: Registered Nurse

## 2016-02-19 ENCOUNTER — Encounter: Payer: Medicare Other | Attending: Physical Medicine & Rehabilitation | Admitting: Registered Nurse

## 2016-02-19 VITALS — BP 126/85 | HR 68

## 2016-02-19 DIAGNOSIS — M5414 Radiculopathy, thoracic region: Secondary | ICD-10-CM | POA: Insufficient documentation

## 2016-02-19 DIAGNOSIS — M545 Low back pain, unspecified: Secondary | ICD-10-CM

## 2016-02-19 DIAGNOSIS — M961 Postlaminectomy syndrome, not elsewhere classified: Secondary | ICD-10-CM

## 2016-02-19 DIAGNOSIS — Z5181 Encounter for therapeutic drug level monitoring: Secondary | ICD-10-CM | POA: Diagnosis not present

## 2016-02-19 DIAGNOSIS — G894 Chronic pain syndrome: Secondary | ICD-10-CM

## 2016-02-19 DIAGNOSIS — Z79899 Other long term (current) drug therapy: Secondary | ICD-10-CM

## 2016-02-19 DIAGNOSIS — M5417 Radiculopathy, lumbosacral region: Secondary | ICD-10-CM | POA: Diagnosis not present

## 2016-02-19 MED ORDER — MORPHINE SULFATE ER 15 MG PO TBCR: 15.0000 mg | EXTENDED_RELEASE_TABLET | Freq: Three times a day (TID) | ORAL | 0 refills | Status: DC

## 2016-02-19 MED ORDER — MORPHINE SULFATE 15 MG PO TABS: 15.0000 mg | ORAL_TABLET | Freq: Three times a day (TID) | ORAL | 0 refills | Status: DC | PRN

## 2016-02-19 NOTE — Progress Notes (Signed)
Subjective:    Patient ID: [name] [name], female    DOB: 07/21/1955, 60 y.o.   MRN: [id number]  HPI: [name] [name] is a 60 year old female who returns for follow up for chronic pain and medication refill.She states her pain is located in her lower back mainly left side radiating into her left hip  And sacral tenderness.She rates her pain 8. Her current exercise regime is walking, performing stretching and chair exercises.  Pain Inventory Average Pain 8 Pain Right Now 8 My pain is constant, sharp, burning and stabbing  In the last 24 hours, has pain interfered with the following? General activity 8 Relation with others 9 Enjoyment of life 8 What TIME of day is your pain at its worst? all times Sleep (in general) Fair  Pain is worse with: walking, bending, sitting, inactivity and standing Pain improves with: rest, heat/ice and medication Relief from Meds: 2  Mobility ability to climb steps?  yes do you drive?  yes use a wheelchair Do you have any goals in this area?  yes  Function disabled: date disabled 5/09 I need assistance with the following:  household duties and shopping Do you have any goals in this area?  yes  Neuro/Psych numbness  Prior Studies Any changes since last visit?  yes  Physicians involved in your care Any changes since last visit?  no   Family History  Problem Relation Age of Onset  . Heart disease Mother   . Hypertension Mother    Social History   Social History  . Marital status: Widowed    Spouse name: N/A  . Number of children: N/A  . Years of education: N/A   Social History Main Topics  . Smoking status: Former Smoker    Packs/day: 0.50    Years: 20.00    Types: Cigarettes    Quit date: 07/21/2008  . Smokeless tobacco: Never Used  . Alcohol use No  . Drug use: No  . Sexual activity: Not Asked   Other Topics Concern  . None   Social History Narrative  . None   Past Surgical History:  Procedure Laterality Date    . ABDOMINAL HYSTERECTOMY    . COLON SURGERY  05/11/11   [facility] Lysis of adhesions Crohn's  . SPINE SURGERY     Past Medical History:  Diagnosis Date  . Crohn's disease ([place])   . Disorder of sacroiliac joint   . Hypertension   . Lumbago   . Lumbar post-laminectomy syndrome   . Lumbosacral neuritis   . Lumbosacral radiculitis   . Sciatica    BP 126/85   Pulse 68   SpO2 98%   Opioid Risk Score:   Fall Risk Score:  `1  Depression screen PHQ 2/9  Depression screen [facility] 2/9 01/19/2016 01/15/2016 12/21/2015 11/19/2015 08/20/2015 07/23/2015 03/02/2015  Decreased Interest - 0 0 0 0 3 3  Down, Depressed, Hopeless - 0 0 0 0 0 0  PHQ - 2 Score - 0 0 0 0 3 3  Altered sleeping 0 - - - 1 - -  Tired, decreased energy 0 - - - 0 - -  Change in appetite - - - - 1 - -  Feeling bad or failure about yourself  - - - - 0 - -  Trouble concentrating - - - - 0 - -  Moving slowly or fidgety/restless - - - - 0 - -  Suicidal thoughts - - - - 0 - -  PHQ-9 Score - - - - 2 - -  Difficult doing work/chores - - - - Not difficult at all - -    Review of Systems  Constitutional: Negative.   HENT: Negative.   Eyes: Negative.   Respiratory: Negative.   Cardiovascular: Negative.   Gastrointestinal: Positive for nausea.  Endocrine: Negative.   Genitourinary: Negative.   Musculoskeletal: Positive for back pain.  Skin: Negative.   Allergic/Immunologic: Negative.   Neurological: Positive for numbness.  Hematological: Negative.   Psychiatric/Behavioral: Negative.        Objective:   Physical Exam  Constitutional: She is oriented to person, place, and time. She appears well-developed and well-nourished.  HENT:  Head: Normocephalic and atraumatic.  Neck: Normal range of motion. Neck supple.  Cardiovascular: Normal rate and regular rhythm.   Pulmonary/Chest: Effort normal and breath sounds normal.  Musculoskeletal:  Normal Muscle Bulk and Muscle Testing Reveals: Upper Extremities: Full ROM and Muscle Strength  5/5 Lumbar Paraspinal Tenderness: L-4- L-5 Sacral Tenderness Lower Extremities: Full ROM and Muscle Strength 5/5 Arises from Table slowly Antalgic Gait    Neurological: She is alert and oriented to person, place, and time.  Skin: Skin is warm and dry.  Psychiatric: She has a normal mood and affect.  Nursing note and vitals reviewed.         Assessment & Plan:  1.Low back pain with radiating symptoms in L3-4 distribution: continues to be limited by pain. Refilled: MS contin 15 mg 12 hr. tablet one tablet tid #90 with MSIR 15 mg one tablet three times a day #90. We will continue the opioid monitoring program, this consists of regular clinic visits, examinations, urine drug screen, pill counts as well as use of [place] Controlled Substance Reporting System. Encouraged to Continue exercise regime.  2. Lumbar Radiculopathy: Continue Gabapentin 3. Chronic Right Knee Pain:No complaints today. Continue with Ice Therapy, Voltaren Gel.   20 minutes of face to face patient care time was spent during this visit. All questions were encouraged and answered.   F/U in 1 month

## 2016-02-26 ENCOUNTER — Telehealth: Payer: Self-pay | Admitting: Registered Nurse

## 2016-02-26 MED ORDER — PROMETHAZINE HCL 25 MG PO TABS: ORAL_TABLET | ORAL | 3 refills | Status: DC

## 2016-02-26 NOTE — Telephone Encounter (Signed)
Return [name] [name] call , regarding her promethazine.  Placed a call to [place], last promethazine was ordered on 12/30/15. The Pharmacy states they never received the order.Order placed and [name] [name] have been called  And verbalizes understanding.

## 2016-03-03 ENCOUNTER — Telehealth: Payer: Self-pay

## 2016-03-03 NOTE — Telephone Encounter (Signed)
Patient needs her estrogen, conjugated,-medroxyprogesterone (PREMPRO) 0.3-1.5 MG tablet refilled states she will be out of this tomorrow 03/04/16, states the pharmacy has been trying to get in touch with [place] for a week and they haven't heard anything, can someone please check on this  She wants [facility] sent to the [facility] in [place] on [address].

## 2016-03-04 NOTE — Telephone Encounter (Signed)
Spoke with pt, refill will be ready at 2 pm for primpro

## 2016-03-09 ENCOUNTER — Encounter: Payer: Self-pay | Admitting: Family Medicine

## 2016-03-09 ENCOUNTER — Ambulatory Visit (INDEPENDENT_AMBULATORY_CARE_PROVIDER_SITE_OTHER): Payer: Medicare Other | Admitting: Family Medicine

## 2016-03-09 VITALS — BP 128/76 | HR 82 | Temp 98.7°F | Resp 16 | Ht 66.0 in | Wt 221.0 lb

## 2016-03-09 DIAGNOSIS — Z23 Encounter for immunization: Secondary | ICD-10-CM | POA: Diagnosis not present

## 2016-03-09 DIAGNOSIS — E7800 Pure hypercholesterolemia, unspecified: Secondary | ICD-10-CM | POA: Diagnosis not present

## 2016-03-09 DIAGNOSIS — K50018 Crohn's disease of small intestine with other complication: Secondary | ICD-10-CM | POA: Diagnosis not present

## 2016-03-09 DIAGNOSIS — R739 Hyperglycemia, unspecified: Secondary | ICD-10-CM

## 2016-03-09 DIAGNOSIS — I10 Essential (primary) hypertension: Secondary | ICD-10-CM | POA: Diagnosis not present

## 2016-03-09 DIAGNOSIS — M961 Postlaminectomy syndrome, not elsewhere classified: Secondary | ICD-10-CM | POA: Diagnosis not present

## 2016-03-09 DIAGNOSIS — N951 Menopausal and female climacteric states: Secondary | ICD-10-CM | POA: Diagnosis not present

## 2016-03-09 DIAGNOSIS — E785 Hyperlipidemia, unspecified: Secondary | ICD-10-CM | POA: Diagnosis not present

## 2016-03-09 LAB — COMPREHENSIVE METABOLIC PANEL
ALK PHOS: 72 U/L (ref 33–130)
ALT: 14 U/L (ref 6–29)
AST: 22 U/L (ref 10–35)
Albumin: 4.3 g/dL (ref 3.6–5.1)
BILIRUBIN TOTAL: 0.5 mg/dL (ref 0.2–1.2)
BUN: 10 mg/dL (ref 7–25)
CALCIUM: 9.3 mg/dL (ref 8.6–10.4)
CO2: 29 mmol/L (ref 20–31)
Chloride: 100 mmol/L (ref 98–110)
Creat: 0.96 mg/dL (ref 0.50–1.05)
Glucose, Bld: 95 mg/dL (ref 65–99)
POTASSIUM: 3.7 mmol/L (ref 3.5–5.3)
Sodium: 139 mmol/L (ref 135–146)
TOTAL PROTEIN: 7.6 g/dL (ref 6.1–8.1)

## 2016-03-09 LAB — LIPID PANEL
CHOL/HDL RATIO: 4.9 ratio (ref <=5.0)
Cholesterol: 230 mg/dL — ABNORMAL HIGH (ref 125–200)
HDL: 47 mg/dL (ref >=46)
LDL Cholesterol: 132 mg/dL — ABNORMAL HIGH (ref <130)
TRIGLYCERIDES: 257 mg/dL — AB (ref <150)
VLDL: 51 mg/dL — ABNORMAL HIGH (ref <30)

## 2016-03-09 LAB — POCT URINALYSIS DIP (MANUAL ENTRY)
BILIRUBIN UA: NEGATIVE
BILIRUBIN UA: NEGATIVE
Blood, UA: NEGATIVE
GLUCOSE UA: NEGATIVE
LEUKOCYTES UA: NEGATIVE
Nitrite, UA: NEGATIVE
PROTEIN UA: NEGATIVE
SPEC GRAV UA: 1.02
Urobilinogen, UA: 0.2
pH, UA: 6

## 2016-03-09 LAB — CBC WITH DIFFERENTIAL/PLATELET
BASOS PCT: 0 %
Basophils Absolute: 0 cells/uL (ref 0–200)
EOS ABS: 100 {cells}/uL (ref 15–500)
Eosinophils Relative: 2 %
HEMATOCRIT: 38 % (ref 35.0–45.0)
Hemoglobin: 12.3 g/dL (ref 11.7–15.5)
LYMPHS ABS: 2500 {cells}/uL (ref 850–3900)
Lymphocytes Relative: 50 %
MCH: 27.2 pg (ref 27.0–33.0)
MCHC: 32.4 g/dL (ref 32.0–36.0)
MCV: 83.9 fL (ref 80.0–100.0)
MONO ABS: 400 {cells}/uL (ref 200–950)
MPV: 11.6 fL (ref 7.5–12.5)
Monocytes Relative: 8 %
NEUTROS ABS: 2000 {cells}/uL (ref 1500–7800)
Neutrophils Relative %: 40 %
Platelets: 309 10*3/uL (ref 140–400)
RBC: 4.53 MIL/uL (ref 3.80–5.10)
RDW: 13.6 % (ref 11.0–15.0)
WBC: 5 10*3/uL (ref 3.8–10.8)

## 2016-03-09 MED ORDER — POTASSIUM CHLORIDE CRYS ER 20 MEQ PO TBCR: 20.0000 meq | EXTENDED_RELEASE_TABLET | Freq: Every day | ORAL | 3 refills | Status: DC

## 2016-03-09 NOTE — Progress Notes (Signed)
Subjective:  By signing my name below, I, [name] [name], attest that this documentation has been prepared under the direction and in the presence of [name] Forts, MD. Electronically Signed: Moises [name], [place]. 03/09/2016 , 1:31 PM .  Patient was seen in Room 2 .   Patient ID: [name] [name], female    DOB: 1956-01-01, 60 y.o.   MRN: [id number]  03/09/2016  Medication Refill (pt. wants med for potassium ) and other (pt wants to talk about her weight)   HPI [name] [name] is a 60 y.o. female who presents to [facility] for medication refill and discussion. Patient was assigned to me as her PCP due to insurance. She was seen by Jaynee Eagles, PA-C in July because she needed to be seen prior to being seen by her pain management. She had [name] work done, which was fine but her potassium was low.   She has history of low back pain with multiple surgeries, done by Dr. Velna Ochs at [facility]. Her first surgery was in 09/19/09 and then 2010-09-20. She has a rod in her back but recent visit with Dr. Velna Ochs showed a crack on it, so she was advised a 6th back surgery to fix her back. She's gained about 16 lbs. She has shortness of breath and can only climb about 2 flights of stairs due to back pain.She quit smoking in Sep 19, 2009; started when she was 60 years old. She had MRI of her heart, which showed a little blockage. She denies history of stents. Patient has been on disability since 09/20/2006 for her back pain.   Her sugar was elevated after eating a lot of instant oatmeal after her back surgery.   Family history Her mother passed away in September 19, 2012. She was 60 years old with dementia and CHF diagnosed at 65 years old.  Her father passed away 62 years old due to poison.  She had 3 sisters and a brother.  Oldest sister was diabetic, and couldn't get her sugar down. She didn't want to be on dialysis.  Other sister passed away at 30 years old from childbirth.   She's widowed with 3 children. She lives by herself in [place]. She  denies alcohol use.   Review of Systems  Constitutional: Negative for chills, diaphoresis, fatigue and fever.  HENT: Negative for ear pain, postnasal drip, rhinorrhea, sinus pressure, sore throat and trouble swallowing.   Eyes: Negative for visual disturbance.  Respiratory: Positive for shortness of breath. Negative for cough.   Cardiovascular: Negative for chest pain, palpitations and leg swelling.  Gastrointestinal: Negative for abdominal pain, constipation, diarrhea, nausea and vomiting.  Endocrine: Negative for cold intolerance, heat intolerance, polydipsia, polyphagia and polyuria.  Musculoskeletal: Positive for back pain.  Neurological: Negative for dizziness, tremors, seizures, syncope, facial asymmetry, speech difficulty, weakness, light-headedness, numbness and headaches.    Past Medical History:  Diagnosis Date  . Crohn's disease ([place])   . Disorder of sacroiliac joint   . Hyperlipidemia   . Hypertension   . Lumbago   . Lumbar post-laminectomy syndrome   . Lumbosacral neuritis   . Lumbosacral radiculitis   . Sciatica    Past Surgical History:  Procedure Laterality Date  . ABDOMINAL HYSTERECTOMY    . COLON SURGERY  05/11/11   [facility] Lysis of adhesions Crohn's  . SPINE SURGERY     Allergies  Allergen Reactions  . Orudis [Ketoprofen] Hives, Itching and Rash    onSet: 07/30/1990   Current Outpatient Prescriptions  Medication Sig Dispense Refill  .  atorvastatin (LIPITOR) 80 MG tablet Take 80 mg by mouth daily.     Marland Kitchen estrogen, conjugated,-medroxyprogesterone (PREMPRO) 0.3-1.5 MG tablet Take 1 tablet by mouth every other day. 30 tablet 1  . fluticasone (FLONASE) 50 MCG/ACT nasal spray Place 2 sprays into both nostrils daily.  11  . hydrochlorothiazide (HYDRODIURIL) 25 MG tablet Take 1 tablet (25 mg total) by mouth daily. 90 tablet 3  . promethazine (PHENERGAN) 25 MG tablet TAKE 1 TABLET (25 MG TOTAL) BY MOUTH EVERY 6 (SIX) HOURS AS NEEDED. AS NEEDED FOR NAUSEA 40 tablet 3    . venlafaxine XR (EFFEXOR-XR) 37.5 MG 24 hr capsule Take 1 capsule by mouth daily.    . diclofenac sodium (VOLTAREN) 1 % GEL Apply 2 g topically 4 (four) times daily. 4 Tube 2  . gabapentin (NEURONTIN) 300 MG capsule TAKE 1 CAPSULE BY MOUTH 4 TIMES A DAY--AFTER MEALS AND AT BEDTIME 120 capsule 3  . morphine (MS CONTIN) 15 MG 12 hr tablet Take 1 tablet (15 mg total) by mouth 3 (three) times daily. 90 tablet 0  . morphine (MSIR) 15 MG tablet Take 1 tablet (15 mg total) by mouth every 8 (eight) hours as needed for severe pain. 90 tablet 0  . potassium chloride SA (K-DUR,KLOR-CON) 20 MEQ tablet Take 1 tablet (20 mEq total) by mouth daily. 90 tablet 3   No current facility-administered medications for this visit.    Social History   Social History  . Marital status: Widowed    Spouse name: N/A  . Number of children: 3  . Years of education: N/A   Occupational History  . 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 Smoking status: Former Smoker    Packs/day: 0.50    Years: 20.00    Types: Cigarettes    Quit date: 07/21/2008  . Smokeless tobacco: Never Used  . Alcohol use No  . Drug use: No  . Sexual activity: Not on file   Other Topics Concern  . Not on file   Social History Narrative   Marital status: widowed      Children: 3 children      Employment: disability      Lives: alone in [place]      Tobacco: quit in 2011      Alcohol: none      Exercise: minimal   Family History  Problem Relation Age of Onset  . Heart disease Mother 45    CHF; CAD  . Hypertension Mother   . Diabetes Sister   . Lupus Sister        Objective:    BP 128/76 (BP Location: Left Arm, Patient Position: Sitting, Cuff Size: Large)   Pulse 82   Temp 98.7 F (37.1 C) (Oral)   Resp 16   Ht 5\' 6"  (1.676 m)   Wt 221 lb (100.2 kg)   SpO2 95%   BMI 35.67 kg/m   Physical Exam  Constitutional: She is oriented to person, place, and time. She appears well-developed and  well-nourished. No distress.  HENT:  Head: Normocephalic and atraumatic.  Right Ear: External ear normal.  Left Ear: External ear normal.  Nose: Nose normal.  Mouth/Throat: Oropharynx is clear and moist.  Eyes: Conjunctivae and EOM are normal. Pupils are equal, round, and reactive to light.  Neck: Normal range of motion. Neck supple. Carotid bruit is not present. No thyromegaly present.  Cardiovascular: Normal rate, regular rhythm, normal heart sounds  and intact distal pulses.  Exam reveals no gallop and no friction rub.   No murmur heard. Pulmonary/Chest: Effort normal and breath sounds normal. She has no wheezes. She has no rales.  Abdominal: Soft. Bowel sounds are normal. She exhibits no distension and no mass. There is no tenderness. There is no rebound and no guarding.  Lymphadenopathy:    She has no cervical adenopathy.  Neurological: She is alert and oriented to person, place, and time. No cranial nerve deficit.  Skin: Skin is warm and dry. No rash noted. She is not diaphoretic. No erythema. No pallor.  Psychiatric: She has a normal mood and affect. Her behavior is normal.        Assessment & Plan:   1. Essential hypertension   2. Crohn's disease of small intestine with other complication ([place])   3. Postlaminectomy syndrome, lumbar region   4. Postlaminectomy syndrome, cervical region   5. HLD (hyperlipidemia)   6. Need for prophylactic vaccination and inoculation against influenza   7. Hyperglycemia   8. Menopausal syndrome (hot flashes)    -stable; obtain labs and EKG; rx for KCL provided. -chronic pain managed by pain specialist.   -s/p flu vaccine.   Orders Placed This Encounter  Procedures  . Flu Vaccine QUAD 36+ mos IM  . CBC with Differential/Platelet  . Comprehensive metabolic panel    Order Specific Question:   Has the patient fasted?    Answer:   Yes  . Hemoglobin A1c  . Lipid panel    Order Specific Question:   Has the patient fasted?    Answer:   Yes   . POCT urinalysis dipstick  . EKG 12-Lead   Meds ordered this encounter  Medications  . DISCONTD: potassium chloride SA (K-DUR,KLOR-CON) 20 MEQ tablet    Sig: Take 1 tablet (20 mEq total) by mouth daily.    Dispense:  30 tablet    Refill:  3  . potassium chloride SA (K-DUR,KLOR-CON) 20 MEQ tablet    Sig: Take 1 tablet (20 mEq total) by mouth daily.    Dispense:  90 tablet    Refill:  3    Return in about 6 months (around 09/06/2016) for complete physical examiniation.   I personally performed the services described in this documentation, which was scribed in my presence. The recorded information has been reviewed and considered.  Tarahji Ramthun Elayne Guerin, M.D. [facility] [address] [place], [place]  [address] [phone] phone [phone] fax

## 2016-03-09 NOTE — Patient Instructions (Signed)
     IF you received an x-ray today, you will receive an invoice from [facility]. Please contact [facility] at [phone] with questions or concerns regarding your invoice.   IF you received labwork today, you will receive an invoice from [facility]. Please contact [facility] at [phone] with questions or concerns regarding your invoice.   Our billing staff will not be able to assist you with questions regarding bills from these companies.  You will be contacted with the lab results as soon as they are available. The fastest way to get your results is to activate your My Chart account. Instructions are located on the last page of this paperwork. If you have not heard from us regarding the results in 2 weeks, please contact this office.      

## 2016-03-10 LAB — HEMOGLOBIN A1C
HEMOGLOBIN A1C: 5.8 % — AB (ref <5.7)
Mean Plasma Glucose: 120 mg/dL

## 2016-03-17 ENCOUNTER — Other Ambulatory Visit: Payer: Self-pay

## 2016-03-17 MED ORDER — GABAPENTIN 300 MG PO CAPS: ORAL_CAPSULE | ORAL | 3 refills | Status: DC

## 2016-03-17 NOTE — Telephone Encounter (Signed)
Rx Gabapentin sent to pharmacy. Pt has appt on 03/21/16

## 2016-03-21 ENCOUNTER — Encounter: Payer: Self-pay | Admitting: Registered Nurse

## 2016-03-21 ENCOUNTER — Encounter: Payer: Medicare Other | Attending: Physical Medicine & Rehabilitation | Admitting: Registered Nurse

## 2016-03-21 VITALS — BP 128/80 | HR 62 | Resp 14

## 2016-03-21 DIAGNOSIS — G894 Chronic pain syndrome: Secondary | ICD-10-CM

## 2016-03-21 DIAGNOSIS — M5414 Radiculopathy, thoracic region: Secondary | ICD-10-CM | POA: Diagnosis not present

## 2016-03-21 DIAGNOSIS — Z5181 Encounter for therapeutic drug level monitoring: Secondary | ICD-10-CM | POA: Diagnosis not present

## 2016-03-21 DIAGNOSIS — M5417 Radiculopathy, lumbosacral region: Secondary | ICD-10-CM | POA: Insufficient documentation

## 2016-03-21 DIAGNOSIS — M545 Low back pain, unspecified: Secondary | ICD-10-CM

## 2016-03-21 DIAGNOSIS — Z79899 Other long term (current) drug therapy: Secondary | ICD-10-CM

## 2016-03-21 DIAGNOSIS — M961 Postlaminectomy syndrome, not elsewhere classified: Secondary | ICD-10-CM | POA: Diagnosis not present

## 2016-03-21 MED ORDER — DICLOFENAC SODIUM 1 % TD GEL: 2.0000 g | Freq: Four times a day (QID) | TRANSDERMAL | 2 refills | Status: DC

## 2016-03-21 MED ORDER — MORPHINE SULFATE ER 15 MG PO TBCR: 15.0000 mg | EXTENDED_RELEASE_TABLET | Freq: Three times a day (TID) | ORAL | 0 refills | Status: DC

## 2016-03-21 MED ORDER — MORPHINE SULFATE 15 MG PO TABS: 15.0000 mg | ORAL_TABLET | Freq: Three times a day (TID) | ORAL | 0 refills | Status: DC | PRN

## 2016-03-21 NOTE — Progress Notes (Signed)
Subjective:    Patient ID: [name] [name], female    DOB: [birth year], 60 y.o.   MRN: [id number]  HPI:  [name] [name] is a 60 year old female who returns for follow up for chronic pain and medication refill.She states her pain is located in her lower back mainly left side radiating into her left buttock.She rates her pain 8.Her current exercise regime is walking, performing stretching and chair exercises.   Pain Inventory Average Pain 9 Pain Right Now 8 My pain is constant, sharp, burning, stabbing and aching  In the last 24 hours, has pain interfered with the following? General activity 9 Relation with others 10 Enjoyment of life 10 What TIME of day is your pain at its worst? all Sleep (in general) Fair  Pain is worse with: walking, bending, sitting, inactivity and standing Pain improves with: rest, heat/ice and medication Relief from Meds: 2  Mobility ability to climb steps?  yes do you drive?  yes use a wheelchair Do you have any goals in this area?  yes  Function not employed: date last employed 03/08 disabled: date disabled 05/09 I need assistance with the following:  meal prep, household duties and shopping  Neuro/Psych bladder control problems weakness numbness tingling trouble walking spasms  Prior Studies Any changes since last visit?  no  Physicians involved in your care Any changes since last visit?  no   Family History  Problem Relation Age of Onset  . Heart disease Mother 77    CHF; CAD  . Hypertension Mother   . Diabetes Sister   . Lupus Sister    Social History   Social History  . Marital status: Widowed    Spouse name: N/A  . Number of children: 3  . Years of education: N/A   Occupational History  . 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 Smoking status: Former Smoker    Packs/day: 0.50    Years: 20.00    Types: Cigarettes    Quit date: 07/21/2008  . Smokeless tobacco: Never Used  .  Alcohol use No  . Drug use: No  . Sexual activity: Not Asked   Other Topics Concern  . None   Social History Narrative   Marital status: widowed      Children: 3 children      Employment: disability      Lives: alone in [place]      Tobacco: quit in 2011      Alcohol: none      Exercise: minimal   Past Surgical History:  Procedure Laterality Date  . ABDOMINAL HYSTERECTOMY    . COLON SURGERY  05/11/11   [facility] Lysis of adhesions Crohn's  . SPINE SURGERY     Past Medical History:  Diagnosis Date  . Crohn's disease ([address])   . Disorder of sacroiliac joint   . Hyperlipidemia   . Hypertension   . Lumbago   . Lumbar post-laminectomy syndrome   . Lumbosacral neuritis   . Lumbosacral radiculitis   . Sciatica    BP 128/80 (BP Location: Right Arm, Patient Position: Sitting, Cuff Size: Large)   Pulse 62   Resp 14   SpO2 95%   Opioid Risk Score:   Fall Risk Score:  `1  Depression screen PHQ 2/9  Depression screen [facility] 2/9 03/09/2016 01/19/2016 01/15/2016 12/21/2015 11/19/2015 08/20/2015 07/23/2015  Decreased Interest 0 - 0 0 0 0 3  Down, Depressed, Hopeless  0 - 0 0 0 0 0  PHQ - 2 Score 0 - 0 0 0 0 3  Altered sleeping - 0 - - - 1 -  Tired, decreased energy - 0 - - - 0 -  Change in appetite - - - - - 1 -  Feeling bad or failure about yourself  - - - - - 0 -  Trouble concentrating - - - - - 0 -  Moving slowly or fidgety/restless - - - - - 0 -  Suicidal thoughts - - - - - 0 -  PHQ-9 Score - - - - - 2 -  Difficult doing work/chores - - - - - Not difficult at all -    Review of Systems  Constitutional: Positive for diaphoresis and unexpected weight change.  HENT: Negative.   Eyes: Negative.   Respiratory: Negative.   Cardiovascular: Negative.   Gastrointestinal: Positive for nausea.  Endocrine: Negative.   Genitourinary: Positive for difficulty urinating.  Musculoskeletal: Positive for back pain and gait problem.       Spasms   Neurological: Positive for weakness and numbness.        Tingling  All other systems reviewed and are negative.      Objective:   Physical Exam  Constitutional: She is oriented to person, place, and time. She appears well-developed and well-nourished.  HENT:  Head: Normocephalic and atraumatic.  Neck: Normal range of motion. Neck supple.  Cardiovascular: Normal rate and regular rhythm.   Pulmonary/Chest: Effort normal and breath sounds normal.  Musculoskeletal:  Normal Muscle Bulk and Muscle Testing Reveals: Upper Extremities: Full ROM and Muscle Strength 5/5 Lumbar Paraspinal Tenderness: L-4- L-5 Mainly Left Side Lower Extremities: Full ROM and Muscle Strength 5/5 Arises from Table Slowly Antalgic Gait    Neurological: She is alert and oriented to person, place, and time.  Skin: Skin is warm and dry.  Psychiatric: She has a normal mood and affect.  Nursing note and vitals reviewed.         Assessment & Plan:  1.Low back pain with radiating symptoms in L3-4 distribution: continues to be limited by pain. Refilled: MS contin 15 mg 12 hr. tablet one tablet tid #90 with MSIR 15 mg one tablet three times a day #90. We will continue the opioid monitoring program, this consists of regular clinic visits, examinations, urine drug screen, pill counts as well as use of [place] Controlled Substance Reporting System. Encouraged to Continue exercise regime.  2. Lumbar Radiculopathy: Continue Gabapentin 3. Chronic Right Knee Pain:No complaints today. Continue with Ice Therapy, Voltaren Gel.    20 minutes of face to face patient care time was spent during this visit. All questions were encouraged and answered.   F/U in 1 month

## 2016-03-22 NOTE — Addendum Note (Signed)
Addended by: Geryl Rankins D on: 03/22/2016 03:25 PM   Modules accepted: Orders

## 2016-03-29 ENCOUNTER — Telehealth: Payer: Self-pay | Admitting: Physical Medicine & Rehabilitation

## 2016-03-29 LAB — 6-ACETYLMORPHINE,TOXASSURE ADD
6-ACETYLMORPHINE: NEGATIVE
6-ACETYLMORPHINE: NOT DETECTED ng/mg{creat}

## 2016-03-29 LAB — TOXASSURE SELECT,+ANTIDEPR,UR

## 2016-03-29 NOTE — Telephone Encounter (Signed)
[facility] is telling patient that she needs a prior authorization for Voltaren Gel.  Please call patient when this is complete.

## 2016-03-29 NOTE — Telephone Encounter (Signed)
Called pt and informed pt that prior authorization has been approved for voltaren gel

## 2016-03-30 NOTE — Progress Notes (Signed)
Urine drug screen for this encounter is consistent for prescribed medication 

## 2016-03-31 ENCOUNTER — Telehealth: Payer: Self-pay | Admitting: Physical Medicine & Rehabilitation

## 2016-03-31 NOTE — Telephone Encounter (Signed)
Patient was told by our office that the Voltaren Gel was approved through her insurance company, however, [facility] is telling her they do not have the authorization.  Can someone from our office please call [facility] on [address] to let them know it has been approved, and also call patient back.

## 2016-03-31 NOTE — Telephone Encounter (Signed)
Contacted pharmacy, they are still unable to run script.  I contacted [facility] and they confirmed the approval. However, they could not get the prescription to run through on a test run.  They are sending the problem to their IT dept and have given a 24 hour window for the problem to be resolved. I attempted to call the patient. Unable to leave message

## 2016-04-01 NOTE — Telephone Encounter (Signed)
Contacted pharmacy, had them run the script again and this time it worked, medication is being filled, pharmacy will notify pt

## 2016-04-14 ENCOUNTER — Other Ambulatory Visit: Payer: Self-pay | Admitting: Registered Nurse

## 2016-04-18 ENCOUNTER — Encounter: Payer: Self-pay | Admitting: Physical Medicine & Rehabilitation

## 2016-04-18 ENCOUNTER — Ambulatory Visit (HOSPITAL_BASED_OUTPATIENT_CLINIC_OR_DEPARTMENT_OTHER): Payer: Medicare Other | Admitting: Physical Medicine & Rehabilitation

## 2016-04-18 VITALS — BP 129/88 | HR 77

## 2016-04-18 DIAGNOSIS — M533 Sacrococcygeal disorders, not elsewhere classified: Secondary | ICD-10-CM | POA: Diagnosis not present

## 2016-04-18 DIAGNOSIS — M961 Postlaminectomy syndrome, not elsewhere classified: Secondary | ICD-10-CM | POA: Diagnosis not present

## 2016-04-18 DIAGNOSIS — M5414 Radiculopathy, thoracic region: Secondary | ICD-10-CM

## 2016-04-18 DIAGNOSIS — M5417 Radiculopathy, lumbosacral region: Secondary | ICD-10-CM | POA: Diagnosis not present

## 2016-04-18 MED ORDER — MORPHINE SULFATE ER 15 MG PO TBCR: 15.0000 mg | EXTENDED_RELEASE_TABLET | Freq: Three times a day (TID) | ORAL | 0 refills | Status: DC

## 2016-04-18 MED ORDER — MORPHINE SULFATE 15 MG PO TABS: 15.0000 mg | ORAL_TABLET | Freq: Three times a day (TID) | ORAL | 0 refills | Status: DC | PRN

## 2016-04-18 NOTE — Progress Notes (Signed)
Subjective:    Patient ID: [name] [name], female    DOB: 05-29-1956, 60 y.o.   MRN: [id number]  HPI   Seen by Dr Velna Ochs, on 01/07/16, xrays showing  fx of  S1 pedicle screw, has follow up in November, may have repeat MRI at that time.  Right knee pain improved since injection in Jan No new issues since her last physical medicine and rehabilitation Visit 1 month ago  01/07/16 LS Spine Xray IMPRESSION: 1. Status post posterior osseous fusion L2 to S1. 2. Stable appearance of hardware at the L2-3 levels. 3. Hardware at the L5-S1 levels shows an apparent fracture of the right S1 pedicle screw immediately deep to the metal plate.    Rinaldo Cloud, MD  Pain Inventory Average Pain 8 Pain Right Now 8 My pain is constant, sharp, burning, stabbing and aching  In the last 24 hours, has pain interfered with the following? General activity 10 Relation with others 10 Enjoyment of life 10 What TIME of day is your pain at its worst? all Sleep (in general) Poor  Pain is worse with: walking, bending, sitting, inactivity and standing Pain improves with: rest, heat/ice and medication Relief from Meds: 1  Mobility use a cane how many minutes can you walk? 5 ability to climb steps?  yes do you drive?  yes use a wheelchair Do you have any goals in this area?  yes  Function disabled: date disabled 2008 I need assistance with the following:  meal prep, household duties and shopping Do you have any goals in this area?  yes  Neuro/Psych trouble walking spasms  Prior Studies Any changes since last visit?  yes  Physicians involved in your care Any changes since last visit?  no   Family History  Problem Relation Age of Onset  . Heart disease Mother 68    CHF; CAD  . Hypertension Mother   . Diabetes Sister   . Lupus Sister    Social History   Social History  . Marital status: Widowed    Spouse name: N/A  . Number of children: 3  . Years of education: N/A    Occupational History  . 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 Smoking status: Former Smoker    Packs/day: 0.50    Years: 20.00    Types: Cigarettes    Quit date: 07/21/2008  . Smokeless tobacco: Never Used  . Alcohol use No  . Drug use: No  . Sexual activity: Not Asked   Other Topics Concern  . None   Social History Narrative   Marital status: widowed      Children: 3 children      Employment: disability      Lives: alone in [place]      Tobacco: quit in 2011      Alcohol: none      Exercise: minimal   Past Surgical History:  Procedure Laterality Date  . ABDOMINAL HYSTERECTOMY    . COLON SURGERY  05/11/11   [facility] Lysis of adhesions Crohn's  . SPINE SURGERY     Past Medical History:  Diagnosis Date  . Crohn's disease ([place])   . Disorder of sacroiliac joint   . Hyperlipidemia   . Hypertension   . Lumbago   . Lumbar post-laminectomy syndrome   . Lumbosacral neuritis   . Lumbosacral radiculitis   . Sciatica    BP 129/88   Pulse 77  SpO2 96%   Opioid Risk Score:   Fall Risk Score:  `1  Depression screen PHQ 2/9  Depression screen [facility] 2/9 03/09/2016 01/19/2016 01/15/2016 12/21/2015 11/19/2015 08/20/2015 07/23/2015  Decreased Interest 0 - 0 0 0 0 3  Down, Depressed, Hopeless 0 - 0 0 0 0 0  PHQ - 2 Score 0 - 0 0 0 0 3  Altered sleeping - 0 - - - 1 -  Tired, decreased energy - 0 - - - 0 -  Change in appetite - - - - - 1 -  Feeling bad or failure about yourself  - - - - - 0 -  Trouble concentrating - - - - - 0 -  Moving slowly or fidgety/restless - - - - - 0 -  Suicidal thoughts - - - - - 0 -  PHQ-9 Score - - - - - 2 -  Difficult doing work/chores - - - - - Not difficult at all -      Review of Systems  Constitutional: Positive for unexpected weight change.       Night sweats  Gastrointestinal: Positive for abdominal pain, nausea and vomiting.  All other systems reviewed and are negative.      Objective:   Physical Exam   Constitutional: She is oriented to person, place, and time. She appears well-developed and well-nourished.  HENT:  Head: Normocephalic and atraumatic.  Eyes: Conjunctivae and EOM are normal. Pupils are equal, round, and reactive to light.  Neck: Normal range of motion.  Musculoskeletal: She exhibits deformity.  Flat back no scoliosis  Neurological: She is alert and oriented to person, place, and time.  Psychiatric: She has a normal mood and affect.  Nursing note and vitals reviewed.  Negative straight leg raise 4/5 strength bilateral hip flexor, knee extensor and flexor Tenderness palpation left PSIS area Lumbar spine range of motion 50% flexion 0-25%, extension, as well as 0-25%, lateral bending and rotation.       Assessment & Plan:  1.  Lumbar post lami s/p L2-S1 fusion Has fracture pedicle screw , unclear whether this is cause of pain as pt also has SI issues, fracture on Right , according to xray, more pain on left , Patient thinks that the x-ray was mislabeled. I do not have access to the actual films. She will discuss this with her neurosurgeon  2. RIght knee pain resolved after injection ? Meniscal degeneration as Xray neg

## 2016-04-18 NOTE — Patient Instructions (Signed)
Please ask your surgeon which side the pedicle fracture is, according to the x-ray looks like it's on the right side, but more painful on the left

## 2016-04-28 DIAGNOSIS — Z981 Arthrodesis status: Secondary | ICD-10-CM | POA: Diagnosis not present

## 2016-04-28 DIAGNOSIS — M5432 Sciatica, left side: Secondary | ICD-10-CM | POA: Diagnosis not present

## 2016-04-28 DIAGNOSIS — M48062 Spinal stenosis, lumbar region with neurogenic claudication: Secondary | ICD-10-CM | POA: Diagnosis not present

## 2016-04-28 DIAGNOSIS — M5136 Other intervertebral disc degeneration, lumbar region: Secondary | ICD-10-CM | POA: Diagnosis not present

## 2016-04-28 DIAGNOSIS — M5431 Sciatica, right side: Secondary | ICD-10-CM | POA: Diagnosis not present

## 2016-05-10 ENCOUNTER — Ambulatory Visit (INDEPENDENT_AMBULATORY_CARE_PROVIDER_SITE_OTHER): Payer: Medicare Other | Admitting: Family Medicine

## 2016-05-10 ENCOUNTER — Encounter: Payer: Self-pay | Admitting: Family Medicine

## 2016-05-10 VITALS — BP 124/76 | HR 82 | Temp 98.5°F | Resp 16 | Ht 65.5 in | Wt 225.0 lb

## 2016-05-10 DIAGNOSIS — M961 Postlaminectomy syndrome, not elsewhere classified: Secondary | ICD-10-CM | POA: Diagnosis not present

## 2016-05-10 DIAGNOSIS — Z114 Encounter for screening for human immunodeficiency virus [HIV]: Secondary | ICD-10-CM

## 2016-05-10 DIAGNOSIS — Z1159 Encounter for screening for other viral diseases: Secondary | ICD-10-CM | POA: Diagnosis not present

## 2016-05-10 DIAGNOSIS — R9431 Abnormal electrocardiogram [ECG] [EKG]: Secondary | ICD-10-CM

## 2016-05-10 DIAGNOSIS — I10 Essential (primary) hypertension: Secondary | ICD-10-CM

## 2016-05-10 DIAGNOSIS — Z1231 Encounter for screening mammogram for malignant neoplasm of breast: Secondary | ICD-10-CM | POA: Diagnosis not present

## 2016-05-10 DIAGNOSIS — Z01818 Encounter for other preprocedural examination: Secondary | ICD-10-CM

## 2016-05-10 DIAGNOSIS — Z23 Encounter for immunization: Secondary | ICD-10-CM

## 2016-05-10 LAB — CBC WITH DIFFERENTIAL/PLATELET
Basophils Absolute: 0 cells/uL (ref 0–200)
Basophils Relative: 0 %
EOS PCT: 2 %
Eosinophils Absolute: 104 cells/uL (ref 15–500)
HCT: 39.6 % (ref 35.0–45.0)
HEMOGLOBIN: 12.5 g/dL (ref 11.7–15.5)
LYMPHS ABS: 2184 {cells}/uL (ref 850–3900)
Lymphocytes Relative: 42 %
MCH: 27.2 pg (ref 27.0–33.0)
MCHC: 31.6 g/dL — AB (ref 32.0–36.0)
MCV: 86.3 fL (ref 80.0–100.0)
MPV: 11.6 fL (ref 7.5–12.5)
Monocytes Absolute: 416 cells/uL (ref 200–950)
Monocytes Relative: 8 %
NEUTROS ABS: 2496 {cells}/uL (ref 1500–7800)
NEUTROS PCT: 48 %
Platelets: 316 10*3/uL (ref 140–400)
RBC: 4.59 MIL/uL (ref 3.80–5.10)
RDW: 14.1 % (ref 11.0–15.0)
WBC: 5.2 10*3/uL (ref 3.8–10.8)

## 2016-05-10 LAB — COMPREHENSIVE METABOLIC PANEL
ALBUMIN: 4.3 g/dL (ref 3.6–5.1)
ALT: 18 U/L (ref 6–29)
AST: 26 U/L (ref 10–35)
Alkaline Phosphatase: 66 U/L (ref 33–130)
BILIRUBIN TOTAL: 0.9 mg/dL (ref 0.2–1.2)
BUN: 9 mg/dL (ref 7–25)
CO2: 30 mmol/L (ref 20–31)
CREATININE: 0.99 mg/dL (ref 0.50–1.05)
Calcium: 9.7 mg/dL (ref 8.6–10.4)
Chloride: 103 mmol/L (ref 98–110)
Glucose, Bld: 88 mg/dL (ref 65–99)
Potassium: 4.3 mmol/L (ref 3.5–5.3)
SODIUM: 141 mmol/L (ref 135–146)
Total Protein: 7.5 g/dL (ref 6.1–8.1)

## 2016-05-10 LAB — HIV ANTIBODY (ROUTINE TESTING W REFLEX): HIV 1&2 Ab, 4th Generation: NONREACTIVE

## 2016-05-10 NOTE — Patient Instructions (Signed)
     IF you received an x-ray today, you will receive an invoice from [facility]. Please contact  Radiology at [phone] with questions or concerns regarding your invoice.   IF you received labwork today, you will receive an invoice from [facility]. Please contact [facility] at [phone] with questions or concerns regarding your invoice.   Our billing staff will not be able to assist you with questions regarding bills from these companies.  You will be contacted with the lab results as soon as they are available. The fastest way to get your results is to activate your My Chart account. Instructions are located on the last page of this paperwork. If you have not heard from us regarding the results in 2 weeks, please contact this office.      

## 2016-05-10 NOTE — Progress Notes (Signed)
Subjective:    Patient ID: [name] [name], female    DOB: [birth year], 60 y.o.   MRN: [id number]  05/10/2016  to fill out form for surgery (back surgery,  Jan.5,2018)   HPI This 60 y.o. female presents for pre-operative clearance for lumbar spine surgery.No form to complete; needs medications reviews; clearance for general anesthesia.  S/p five previous lumbar surgeries in the past.  Most recent surgery June 2017.  [facility].  Lives in [place]. Followed by the pain clinic.  Has been maintained on Neurontin for four years for chronic back pain; takes Diclofenac gel PNR lower back pain.  Smoked age [age].  No alcohol; no drugs.  Underwent cardiology pre-operative clearance in 09/2014 by Dr. Horald Pollen of [facility].  Denies chest pain, palpitations, DOE, SOB, orthopnea, leg swelling. No regular exercise due to chronic back pain.    Scheduled for bilateral reexploration lumbar decompression L2-3, L3-4, L4-5, L5-S1; metal removal L2-3, L5-S1.  Repair non-union with reinstrumentation L ICBG.  Xray from 12/2015 revealed fx of S1 pedicle screw.    01/07/16 LS Spine Xray IMPRESSION: 1. Status post posterior osseous fusion L2 to S1. 2. Stable appearance of hardware at the L2-3 levels. 3. Hardware at the L5-S1 levels shows an apparent fracture of the right S1 pedicle screw immediately deep to the metal plate.   Colonoscopy two years ago; removed three high risk polyps; was to repeat colonoscopy one year later.  Last colonoscopy 01/02/2013.  Taking qod Prempro for past two years.  Stopped Effexor; was not effective with hot flashes.  BP Readings from Last 3 Encounters:  05/18/16 (!) 142/89  05/10/16 124/76  04/18/16 129/88   Wt Readings from Last 3 Encounters:  05/18/16 224 lb (101.6 kg)  05/10/16 225 lb (102.1 kg)  03/09/16 221 lb (100.2 kg)    Review of Systems  Constitutional: Negative for chills, diaphoresis, fatigue and fever.  Eyes: Negative for visual disturbance.    Respiratory: Negative for cough and shortness of breath.   Cardiovascular: Negative for chest pain, palpitations and leg swelling.  Gastrointestinal: Negative for abdominal pain, constipation, diarrhea, nausea and vomiting.  Endocrine: Negative for cold intolerance, heat intolerance, polydipsia, polyphagia and polyuria.  Musculoskeletal: Positive for back pain.  Neurological: Negative for dizziness, tremors, seizures, syncope, facial asymmetry, speech difficulty, weakness, light-headedness, numbness and headaches.    Past Medical History:  Diagnosis Date  . Crohn's disease ([place])   . Disorder of sacroiliac joint   . Hyperlipidemia   . Hypertension   . Lumbago   . Lumbar post-laminectomy syndrome   . Lumbosacral neuritis   . Lumbosacral radiculitis   . Sciatica    Past Surgical History:  Procedure Laterality Date  . ABDOMINAL HYSTERECTOMY    . CARDIAC CATHETERIZATION  1990's   [name]  . COLON SURGERY  05/11/11   [facility] of adhesions Crohn's  . SPINE SURGERY     Allergies  Allergen Reactions  . Orudis [Ketoprofen] Hives, Itching and Rash    onSet: 07/30/1990   Current Outpatient Prescriptions  Medication Sig Dispense Refill  . atorvastatin (LIPITOR) 80 MG tablet Take 80 mg by mouth daily.     . diclofenac sodium (VOLTAREN) 1 % GEL Apply 2 g topically 4 (four) times daily. 4 Tube 2  . estrogen, conjugated,-medroxyprogesterone (PREMPRO) 0.3-1.5 MG tablet Take 1 tablet by mouth every other day. 30 tablet 1  . fluticasone (FLONASE) 50 MCG/ACT nasal spray Place 2 sprays into both nostrils daily.  11  .  gabapentin (NEURONTIN) 300 MG capsule TAKE 1 CAPSULE BY MOUTH 4 TIMES A DAY--AFTER MEALS AND AT BEDTIME 120 capsule 3  . hydrochlorothiazide (HYDRODIURIL) 25 MG tablet Take 1 tablet (25 mg total) by mouth daily. 90 tablet 3  . morphine (MS CONTIN) 15 MG 12 hr tablet Take 1 tablet (15 mg total) by mouth 3 (three) times daily. 90 tablet 0  . morphine (MSIR) 15 MG tablet Take 1  tablet (15 mg total) by mouth every 8 (eight) hours as needed for severe pain. 90 tablet 0  . Multiple Vitamins-Minerals (CENTRAVITES 50 PLUS PO) Take by mouth.    Marland Kitchen OVER THE COUNTER MEDICATION Biotin 500 mcg taking one daily    . potassium chloride SA (K-DUR,KLOR-CON) 20 MEQ tablet Take 1 tablet (20 mEq total) by mouth daily. 90 tablet 3  . promethazine (PHENERGAN) 25 MG tablet TAKE 1 TABLET (25 MG TOTAL) BY MOUTH EVERY 6 (SIX) HOURS AS NEEDED. AS NEEDED FOR NAUSEA 40 tablet 3  . BIOTIN PO Take by mouth daily.    . Multiple Vitamin (MULTIVITAMIN) tablet Take 1 tablet by mouth daily.     No current facility-administered medications for this visit.    Social History   Social History  . Marital status: Widowed    Spouse name: N/A  . Number of children: 3  . Years of education: N/A   Occupational History  . 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 Smoking status: Former Smoker    Packs/day: 0.50    Years: 20.00    Types: Cigarettes    Quit date: 07/21/2008  . Smokeless tobacco: Never Used  . Alcohol use No  . Drug use: No  . Sexual activity: Not on file   Other Topics Concern  . Not on file   Social History Narrative   Marital status: widowed      Children: 3 children      Employment: disability      Lives: alone in [place]      Tobacco: quit in 2011      Alcohol: none      Exercise: minimal   Family History  Problem Relation Age of Onset  . Heart disease Mother 39    CHF; CAD  . Hypertension Mother   . Heart failure Mother   . Diabetes Sister   . Lupus Sister   . Hypertension Brother   . Hyperlipidemia Sister   . Hypertension Sister        Objective:    BP 124/76   Pulse 82   Temp 98.5 F (36.9 C) (Oral)   Resp 16   Ht 5' 5.5" (1.664 m)   Wt 225 lb (102.1 kg)   SpO2 95%   BMI 36.87 kg/m  Physical Exam  Constitutional: She is oriented to person, place, and time. She appears well-developed and well-nourished. No distress.    HENT:  Head: Normocephalic and atraumatic.  Right Ear: External ear normal.  Left Ear: External ear normal.  Nose: Nose normal.  Mouth/Throat: Oropharynx is clear and moist.  Eyes: Conjunctivae and EOM are normal. Pupils are equal, round, and reactive to light.  Neck: Normal range of motion. Neck supple. Carotid bruit is not present. No thyromegaly present.  Cardiovascular: Normal rate, regular rhythm, normal heart sounds and intact distal pulses.  Exam reveals no gallop and no friction rub.   No murmur heard. Pulmonary/Chest: Effort normal and breath sounds normal. She has no  wheezes. She has no rales.  Abdominal: Soft. Bowel sounds are normal. She exhibits no distension and no mass. There is no tenderness. There is no rebound and no guarding.  Lymphadenopathy:    She has no cervical adenopathy.  Neurological: She is alert and oriented to person, place, and time. No cranial nerve deficit.  Skin: Skin is warm and dry. No rash noted. She is not diaphoretic. No erythema. No pallor.  Psychiatric: She has a normal mood and affect. Her behavior is normal.   Results for orders placed or performed in visit on 05/10/16  CBC with Differential/Platelet  Result Value Ref Range   WBC 5.2 3.8 - 10.8 K/uL   RBC 4.59 3.80 - 5.10 MIL/uL   Hemoglobin 12.5 11.7 - 15.5 g/dL   HCT 39.6 35.0 - 45.0 %   MCV 86.3 80.0 - 100.0 fL   MCH 27.2 27.0 - 33.0 pg   MCHC 31.6 (L) 32.0 - 36.0 g/dL   RDW 14.1 11.0 - 15.0 %   Platelets 316 140 - 400 K/uL   MPV 11.6 7.5 - 12.5 fL   Neutro Abs 2,496 1,500 - 7,800 cells/uL   Lymphs Abs 2,184 850 - 3,900 cells/uL   Monocytes Absolute 416 200 - 950 cells/uL   Eosinophils Absolute 104 15 - 500 cells/uL   Basophils Absolute 0 0 - 200 cells/uL   Neutrophils Relative % 48 %   Lymphocytes Relative 42 %   Monocytes Relative 8 %   Eosinophils Relative 2 %   Basophils Relative 0 %   Smear Review Criteria for review not met   Comprehensive metabolic panel  Result Value  Ref Range   Sodium 141 135 - 146 mmol/L   Potassium 4.3 3.5 - 5.3 mmol/L   Chloride 103 98 - 110 mmol/L   CO2 30 20 - 31 mmol/L   Glucose, Bld 88 65 - 99 mg/dL   BUN 9 7 - 25 mg/dL   Creat 0.99 0.50 - 1.05 mg/dL   Total Bilirubin 0.9 0.2 - 1.2 mg/dL   Alkaline Phosphatase 66 33 - 130 U/L   AST 26 10 - 35 U/L   ALT 18 6 - 29 U/L   Total Protein 7.5 6.1 - 8.1 g/dL   Albumin 4.3 3.6 - 5.1 g/dL   Calcium 9.7 8.6 - 10.4 mg/dL  HIV antibody  Result Value Ref Range   HIV 1&2 Ab, 4th Generation NONREACTIVE NONREACTIVE  Hepatitis C antibody  Result Value Ref Range   HCV Ab NEGATIVE NEGATIVE   EKG: NSR; diffuse ST changes throughout.    Assessment & Plan:   1. Essential hypertension, benign   2. Pre-operative clearance   3. Nonspecific abnormal electrocardiogram (ECG) (EKG)   4. Postlaminectomy syndrome, lumbar region   5. Encounter for screening for HIV   6. Need for hepatitis C screening test   7. Encounter for screening mammogram for breast cancer   8. Need for Tdap vaccination    -with abnormal EKG and multiple cardiac risk factors, warrants cardiology consultation prior to lumbar spine surgery; refer to cardiology. Obtain labs.  Sedentary at this time. -obtain age appropriate screening labs. -refer for mammogram -s/p TDAP  Orders Placed This Encounter  Procedures  . MM SCREENING BREAST TOMO BILATERAL    Standing Status:   Future    Standing Expiration Date:   07/11/2017    Order Specific Question:   Reason for Exam (SYMPTOM  OR DIAGNOSIS REQUIRED)    Answer:   annual  screening    Order Specific Question:   Is the patient pregnant?    Answer:   No    Order Specific Question:   Preferred imaging location?    Answer:   [facility]  . Tdap vaccine greater than or equal to 7yo IM  . CBC with Differential/Platelet  . Comprehensive metabolic panel  . HIV antibody  . Hepatitis C antibody  . Ambulatory referral to Cardiology    Referral Priority:   Routine    Referral  Type:   Consultation    Referral Reason:   Specialty Services Required    Requested Specialty:   Cardiology    Number of Visits Requested:   1  . EKG 12-Lead   Meds ordered this encounter  Medications  . Multiple Vitamins-Minerals (CENTRAVITES 50 PLUS PO)    Sig: Take by mouth.  Marland Kitchen OVER THE COUNTER MEDICATION    Sig: Biotin 500 mcg taking one daily    Return in about 4 months (around 09/07/2016) for complete physical examiniation.   [place] Elayne Guerin, M.D. [facility] [address] [place], [place]  [address] [phone] phone [phone] fax

## 2016-05-11 ENCOUNTER — Encounter: Payer: Self-pay | Admitting: Family Medicine

## 2016-05-11 LAB — HEPATITIS C ANTIBODY: HCV AB: NEGATIVE

## 2016-05-13 NOTE — Telephone Encounter (Signed)
Referrals, I'm going to forward this on to you for scheduling info. Thanks!

## 2016-05-16 DIAGNOSIS — M545 Low back pain: Secondary | ICD-10-CM | POA: Diagnosis not present

## 2016-05-16 DIAGNOSIS — M5126 Other intervertebral disc displacement, lumbar region: Secondary | ICD-10-CM | POA: Diagnosis not present

## 2016-05-16 DIAGNOSIS — M47818 Spondylosis without myelopathy or radiculopathy, sacral and sacrococcygeal region: Secondary | ICD-10-CM | POA: Diagnosis not present

## 2016-05-16 DIAGNOSIS — M160 Bilateral primary osteoarthritis of hip: Secondary | ICD-10-CM | POA: Diagnosis not present

## 2016-05-16 DIAGNOSIS — Z981 Arthrodesis status: Secondary | ICD-10-CM | POA: Diagnosis not present

## 2016-05-18 ENCOUNTER — Ambulatory Visit (INDEPENDENT_AMBULATORY_CARE_PROVIDER_SITE_OTHER): Payer: Medicare Other | Admitting: Cardiology

## 2016-05-18 ENCOUNTER — Encounter: Payer: Self-pay | Admitting: Cardiology

## 2016-05-18 VITALS — BP 142/89 | HR 77 | Ht 66.0 in | Wt 224.0 lb

## 2016-05-18 DIAGNOSIS — I10 Essential (primary) hypertension: Secondary | ICD-10-CM

## 2016-05-18 DIAGNOSIS — Z01818 Encounter for other preprocedural examination: Secondary | ICD-10-CM | POA: Diagnosis not present

## 2016-05-18 NOTE — Patient Instructions (Addendum)
Testing/Procedures: Your physician has requested that you have an echocardiogram. Echocardiography is a painless test that uses sound waves to create images of your heart. It provides your doctor with information about the size and shape of your heart and how well your heart's chambers and valves are working. This procedure takes approximately one hour. There are no restrictions for this procedure.  [place]  Your caregiver has ordered a Stress Test with nuclear imaging. The purpose of this test is to evaluate the blood supply to your heart muscle. This procedure is referred to as a "Non-Invasive Stress Test." This is because other than having an IV started in your vein, nothing is inserted or "invades" your body. Cardiac stress tests are done to find areas of poor blood flow to the heart by determining the extent of coronary artery disease (CAD). Some patients exercise on a treadmill, which naturally increases the blood flow to your heart, while others who are  unable to walk on a treadmill due to physical limitations have a pharmacologic/chemical stress agent called Lexiscan . This medicine will mimic walking on a treadmill by temporarily increasing your coronary blood flow.   Please note: these test may take anywhere between 2-4 hours to complete  PLEASE REPORT TO [facility] [place] AT THE FIRST DESK WILL DIRECT YOU WHERE TO GO  Date of Procedure:_Tuesday May 24, 2016 at 08:00AM_  Arrival Time for Procedure:_Arrive at 07:45AM to register__  Instructions regarding medication:   _X__ : Hold diabetes medication morning of procedure  __X__:  Hold hydrochlorothiazide the morning of your procedure.   PLEASE NOTIFY THE OFFICE AT LEAST 49 HOURS IN ADVANCE IF YOU ARE UNABLE TO KEEP YOUR APPOINTMENT.  [phone] AND  PLEASE NOTIFY NUCLEAR MEDICINE AT [facility] AT LEAST 24 HOURS IN ADVANCE IF YOU ARE UNABLE TO KEEP YOUR APPOINTMENT. [phone]  How to prepare for  your Myoview test:  1. Do not eat or drink after midnight 2. No caffeine for 24 hours prior to test 3. No smoking 24 hours prior to test. 4. Your medication may be taken with water.  If your doctor stopped a medication because of this test, do not take that medication. 5. Ladies, please do not wear dresses.  Skirts or pants are appropriate. Please wear a short sleeve shirt. 6. No perfume, cologne or lotion. 7. Wear comfortable walking shoes. No heels!   Follow-Up: Your physician recommends that you schedule a follow-up appointment as needed with Dr. Yvone Neu. We will call you with results and if needed schedule follow up at that time.   It was a pleasure seeing you today here in the office. Please do not hesitate to give [place] a call back if you have any further questions. [place], BSN     Echocardiogram An echocardiogram, or echocardiography, uses sound waves (ultrasound) to produce an image of your heart. The echocardiogram is simple, painless, obtained within a short period of time, and offers valuable information to your health care provider. The images from an echocardiogram can provide information such as:  Evidence of coronary artery disease (CAD).  Heart size.  Heart muscle function.  Heart valve function.  Aneurysm detection.  Evidence of a past heart attack.  Fluid buildup around the heart.  Heart muscle thickening.  Assess heart valve function. Tell a health care provider about:  Any allergies you have.  All medicines you are taking, including vitamins, herbs, eye drops, creams, and over-the-counter medicines.  Any problems  you or family members have had with anesthetic medicines.  Any blood disorders you have.  Any surgeries you have had.  Any medical conditions you have.  Whether you are pregnant or may be pregnant. What happens before the procedure? No special preparation is needed. Eat and drink normally. What happens during the  procedure?  In order to produce an image of your heart, gel will be applied to your chest and a wand-like tool (transducer) will be moved over your chest. The gel will help transmit the sound waves from the transducer. The sound waves will harmlessly bounce off your heart to allow the heart images to be captured in real-time motion. These images will then be recorded.  You may need an IV to receive a medicine that improves the quality of the pictures. What happens after the procedure? You may return to your normal schedule including diet, activities, and medicines, unless your health care provider tells you otherwise. This information is not intended to replace advice given to you by your health care provider. Make sure you discuss any questions you have with your health care provider. Document Released: 06/03/2000 Document Revised: 01/23/2016 Document Reviewed: 02/11/2013 Elsevier Interactive Patient Education  2017 [employer]. Pharmacologic Stress Electrocardiogram A pharmacologic stress electrocardiogram is a heart (cardiac) test that uses nuclear imaging to evaluate the blood supply to your heart. This test may also be called a pharmacologic stress electrocardiography. Pharmacologic means that a medicine is used to increase your heart rate and blood pressure.  This stress test is done to find areas of poor blood flow to the heart by determining the extent of coronary artery disease (CAD). Some people exercise on a treadmill, which naturally increases the blood flow to the heart. For those people unable to exercise on a treadmill, a medicine is used. This medicine stimulates your heart and will cause your heart to beat harder and more quickly, as if you were exercising.  Pharmacologic stress tests can help determine:  The adequacy of blood flow to your heart during increased levels of activity in order to clear you for discharge home.  The extent of coronary artery blockage caused by  CAD.  Your prognosis if you have suffered a heart attack.  The effectiveness of cardiac procedures done, such as an angioplasty, which can increase the circulation in your coronary arteries.  Causes of chest pain or pressure. LET [facility] CARE PROVIDER KNOW ABOUT:  Any allergies you have.  All medicines you are taking, including vitamins, herbs, eye drops, creams, and over-the-counter medicines.  Previous problems you or members of your family have had with the use of anesthetics.  Any blood disorders you have.  Previous surgeries you have had.  Medical conditions you have.  Possibility of pregnancy, if this applies.  If you are currently breastfeeding. RISKS AND COMPLICATIONS Generally, this is a safe procedure. However, as with any procedure, complications can occur. Possible complications include:  You develop pain or pressure in the following areas:  Chest.  Jaw or neck.  Between your shoulder blades.  Radiating down your left arm.  Headache.  Dizziness or light-headedness.  Shortness of breath.  Increased or irregular heartbeat.  Low blood pressure.  Nausea or vomiting.  Flushing.  Redness going up the arm and slight pain during injection of medicine.  Heart attack (rare). BEFORE THE PROCEDURE   Avoid all forms of caffeine for 24 hours before your test or as directed by your health care provider. This includes coffee, tea (even  decaffeinated tea), caffeinated sodas, chocolate, cocoa, and certain pain medicines.  Follow your health care provider's instructions regarding eating and drinking before the test.  Take your medicines as directed at regular times with water unless instructed otherwise. Exceptions may include:  If you have diabetes, ask how you are to take your insulin or pills. It is common to adjust insulin dosing the morning of the test.  If you are taking beta-blocker medicines, it is important to talk to your health care provider  about these medicines well before the date of your test. Taking beta-blocker medicines may interfere with the test. In some cases, these medicines need to be changed or stopped 24 hours or more before the test.  If you wear a nitroglycerin patch, it may need to be removed prior to the test. Ask your health care provider if the patch should be removed before the test.  If you use an inhaler for any breathing condition, bring it with you to the test.  If you are an outpatient, bring a snack so you can eat right after the stress phase of the test.  Do not smoke for 4 hours prior to the test or as directed by your health care provider.  Do not apply lotions, powders, creams, or oils on your chest prior to the test.  Wear comfortable shoes and clothing. Let your health care provider know if you were unable to complete or follow the preparations for your test. PROCEDURE   Multiple patches (electrodes) will be put on your chest. If needed, small areas of your chest may be shaved to get better contact with the electrodes. Once the electrodes are attached to your body, multiple wires will be attached to the electrodes, and your heart rate will be monitored.  An IV access will be started. A nuclear trace (isotope) is given. The isotope may be given intravenously, or it may be swallowed. Nuclear refers to several types of radioactive isotopes, and the nuclear isotope lights up the arteries so that the nuclear images are clear. The isotope is absorbed by your body. This results in low radiation exposure.  A resting nuclear image is taken to show how your heart functions at rest.  A medicine is given through the IV access.  A second scan is done about 1 hour after the medicine injection and determines how your heart functions under stress.  During this stress phase, you will be connected to an electrocardiogram machine. Your blood pressure and oxygen levels will be monitored. AFTER THE PROCEDURE    Your heart rate and blood pressure will be monitored after the test.  You may return to your normal schedule, including diet,activities, and medicines, unless your health care provider tells you otherwise. This information is not intended to replace advice given to you by your health care provider. Make sure you discuss any questions you have with your health care provider. Document Released: 10/23/2008 Document Revised: 06/11/2013 Document Reviewed: 02/11/2013 Elsevier Interactive Patient Education  2017 [employer].

## 2016-05-18 NOTE — Progress Notes (Signed)
Cardiology Office Note   Date:  05/18/2016   ID:  [name], DOB Apr 13, 1956, MRN [id number]  Referring Doctor:  [name], MD   Cardiologist:   Wende Bushy, MD   Reason for consultation:  Chief Complaint  Patient presents with  . other    Pt needing preoperative clearance no complaints today. Meds reviewed verbally with pt.      History of Present Illness: [name] [name] is a 60 y.o. female who presents for Preoperative cardiac evaluation prior to spine surgery.  Patient denies chest pain, shortness of breath. She has very limited functional capacity, apical 2 with walking due to back pains. Really unable to assess functional capacity. Denies palpitations denies syncope.   ROS:  Please see the history of present illness. Aside from mentioned under HPI, all other systems are reviewed and negative.     Past Medical History:  Diagnosis Date  . Crohn's disease ([place])   . Disorder of sacroiliac joint   . Hyperlipidemia   . Hypertension   . Lumbago   . Lumbar post-laminectomy syndrome   . Lumbosacral neuritis   . Lumbosacral radiculitis   . Sciatica     Past Surgical History:  Procedure Laterality Date  . ABDOMINAL HYSTERECTOMY    . CARDIAC CATHETERIZATION  1990's   [name]  . COLON SURGERY  05/11/11   [facility] of adhesions Crohn's  . SPINE SURGERY       reports that she quit smoking about 7 years ago. Her smoking use included Cigarettes. She has a 10.00 pack-year smoking history. She has never used smokeless tobacco. She reports that she does not drink alcohol or use drugs.   family history includes Diabetes in her sister; Heart disease (age of onset: 32) in her mother; Heart failure in her mother; Hyperlipidemia in her sister; Hypertension in her brother, mother, and sister; Lupus in her sister.   Outpatient Medications Prior to Visit  Medication Sig Dispense Refill  . atorvastatin (LIPITOR) 80 MG tablet Take 80 mg by mouth daily.     .  diclofenac sodium (VOLTAREN) 1 % GEL Apply 2 g topically 4 (four) times daily. 4 Tube 2  . estrogen, conjugated,-medroxyprogesterone (PREMPRO) 0.3-1.5 MG tablet Take 1 tablet by mouth every other day. 30 tablet 1  . fluticasone (FLONASE) 50 MCG/ACT nasal spray Place 2 sprays into both nostrils daily.  11  . gabapentin (NEURONTIN) 300 MG capsule TAKE 1 CAPSULE BY MOUTH 4 TIMES A DAY--AFTER MEALS AND AT BEDTIME 120 capsule 3  . hydrochlorothiazide (HYDRODIURIL) 25 MG tablet Take 1 tablet (25 mg total) by mouth daily. 90 tablet 3  . morphine (MS CONTIN) 15 MG 12 hr tablet Take 1 tablet (15 mg total) by mouth 3 (three) times daily. 90 tablet 0  . morphine (MSIR) 15 MG tablet Take 1 tablet (15 mg total) by mouth every 8 (eight) hours as needed for severe pain. 90 tablet 0  . Multiple Vitamins-Minerals (CENTRAVITES 50 PLUS PO) Take by mouth.    Marland Kitchen OVER THE COUNTER MEDICATION Biotin 500 mcg taking one daily    . potassium chloride SA (K-DUR,KLOR-CON) 20 MEQ tablet Take 1 tablet (20 mEq total) by mouth daily. 90 tablet 3  . promethazine (PHENERGAN) 25 MG tablet TAKE 1 TABLET (25 MG TOTAL) BY MOUTH EVERY 6 (SIX) HOURS AS NEEDED. AS NEEDED FOR NAUSEA 40 tablet 3  . venlafaxine XR (EFFEXOR-XR) 37.5 MG 24 hr capsule Take 1 capsule by mouth daily.  No facility-administered medications prior to visit.      Allergies: Orudis [ketoprofen]    PHYSICAL EXAM: VS:  BP (!) 142/89 (BP Location: Right Arm, Patient Position: Sitting, Cuff Size: Large)   Pulse 77   Ht 5\' 6"  (1.676 m)   Wt 224 lb (101.6 kg)   BMI 36.15 kg/m  , Body mass index is 36.15 kg/m. Wt Readings from Last 3 Encounters:  05/18/16 224 lb (101.6 kg)  05/10/16 225 lb (102.1 kg)  03/09/16 221 lb (100.2 kg)    GENERAL:  well developed, well nourished,obese, not in acute distress HEENT: normocephalic, pink conjunctivae, anicteric sclerae, no xanthelasma, normal dentition, oropharynx clear NECK:  no neck vein engorgement, JVP normal, no  hepatojugular reflux, carotid upstroke brisk and symmetric, no bruit, no thyromegaly, no lymphadenopathy LUNGS:  good respiratory effort, clear to auscultation bilaterally CV:  PMI not displaced, no thrills, no lifts, S1 and S2 within normal limits, no palpable S3 or S4, no murmurs, no rubs, no gallops ABD:  Soft, nontender, nondistended, normoactive bowel sounds, no abdominal aortic bruit, no hepatomegaly, no splenomegaly MS: nontender back, no kyphosis, no scoliosis, no joint deformities EXT:  2+ DP/PT pulses, no edema, no varicosities, no cyanosis, no clubbing SKIN: warm, nondiaphoretic, normal turgor, no ulcers NEUROPSYCH: alert, oriented to person, place, and time, sensory/motor grossly intact, normal mood, appropriate affect  Recent Labs: 05/10/2016: ALT 18; BUN 9; Creat 0.99; Hemoglobin 12.5; Platelets 316; Potassium 4.3; Sodium 141   Lipid Panel    Component Value Date/Time   CHOL 230 (H) 03/09/2016 1319   TRIG 257 (H) 03/09/2016 1319   HDL 47 03/09/2016 1319   CHOLHDL 4.9 03/09/2016 1319   VLDL 51 (H) 03/09/2016 1319   LDLCALC 132 (H) 03/09/2016 1319     Other studies Reviewed:  EKG:  The ekg from 06/17/2016 was personally reviewed by me and it revealed sinus rhythm, 77 BPM. Nonspecific ST-T wave changes.  Additional studies/ records that were reviewed personally reviewed by me today include: None available   ASSESSMENT AND PLAN:  Abnormal EKG Preoperative cardiac evaluation prior to spine surgery Cannot assess functional capacity due to limited walking due to back pain Risk factors for CAD include high blood pressure, hyperlipidemia, obesity, family history, previous smoking history Recommend further evaluation with pharmacologic nuclear stress test and echocardiogram. If no no evidence of ischemia, patient will likely be intermediate cardiac risk for moderate risk surgery.   Hypertension BP is well controlled. Continue monitoring BP. Continue current medical  therapy and lifestyle changes. Recommend aspirin 81 mg by mouth daily, likely post surgery. Once okay with surgeon.   Obesity Body mass index is 36.15 kg/m.Marland Kitchen Recommend aggressive weight loss through diet and increased physical activity. Once cardiac workup is completed   Current medicines are reviewed at length with the patient today.  The patient does not have concerns regarding medicines.  Labs/ tests ordered today include:  Orders Placed This Encounter  Procedures  . EKG 12-Lead    I had a lengthy and detailed discussion with the patient regarding diagnoses, prognosis, diagnostic options, treatment options , and side effects of medications.   I counseled the patient on importance of lifestyle modification including heart healthy diet, regular physical activity .   Disposition:   FU with undersigned after tests   Signed, Wende Bushy, MD  05/18/2016 2:59 PM    [place]  This note was generated in part with voice recognition software and I apologize for any typographical errors  that were not detected and corrected.

## 2016-05-19 ENCOUNTER — Encounter: Payer: Medicare Other | Attending: Physical Medicine & Rehabilitation | Admitting: Registered Nurse

## 2016-05-19 ENCOUNTER — Encounter: Payer: Self-pay | Admitting: Registered Nurse

## 2016-05-19 VITALS — BP 135/88 | HR 71

## 2016-05-19 DIAGNOSIS — M5414 Radiculopathy, thoracic region: Secondary | ICD-10-CM | POA: Insufficient documentation

## 2016-05-19 DIAGNOSIS — M5417 Radiculopathy, lumbosacral region: Secondary | ICD-10-CM | POA: Insufficient documentation

## 2016-05-19 DIAGNOSIS — G894 Chronic pain syndrome: Secondary | ICD-10-CM | POA: Diagnosis not present

## 2016-05-19 DIAGNOSIS — Z79899 Other long term (current) drug therapy: Secondary | ICD-10-CM

## 2016-05-19 DIAGNOSIS — G8929 Other chronic pain: Secondary | ICD-10-CM

## 2016-05-19 DIAGNOSIS — M961 Postlaminectomy syndrome, not elsewhere classified: Secondary | ICD-10-CM | POA: Insufficient documentation

## 2016-05-19 DIAGNOSIS — Z5181 Encounter for therapeutic drug level monitoring: Secondary | ICD-10-CM

## 2016-05-19 DIAGNOSIS — M545 Low back pain: Secondary | ICD-10-CM

## 2016-05-19 DIAGNOSIS — M533 Sacrococcygeal disorders, not elsewhere classified: Secondary | ICD-10-CM | POA: Diagnosis not present

## 2016-05-19 MED ORDER — OXYCODONE ER 18 MG PO C12A
18.0000 mg | EXTENDED_RELEASE_CAPSULE | Freq: Two times a day (BID) | ORAL | 0 refills | Status: DC
Start: 2016-05-19 — End: 2016-06-15

## 2016-05-19 MED ORDER — MORPHINE SULFATE 15 MG PO TABS: 15.0000 mg | ORAL_TABLET | Freq: Three times a day (TID) | ORAL | 0 refills | Status: DC | PRN

## 2016-05-19 MED ORDER — OXYCODONE HCL 10 MG PO TABS: 10.0000 mg | ORAL_TABLET | Freq: Two times a day (BID) | ORAL | 0 refills | Status: DC | PRN

## 2016-05-19 MED ORDER — MORPHINE SULFATE ER 15 MG PO TBCR: 15.0000 mg | EXTENDED_RELEASE_TABLET | Freq: Three times a day (TID) | ORAL | 0 refills | Status: DC

## 2016-05-19 NOTE — Progress Notes (Signed)
Subjective:    Patient ID: [name] [name], female    DOB: 02/23/1956, 60 y.o.   MRN: [id number]  HPI: [name] is a 60 year old female who returns for follow up for chronic pain and medication refill.She states her pain is located in her lower back  radiating into her left lower extremity laterally with sacral tenderness. Also states her pain has intensified with no relief of pain,  [name] states her analgesics are not controlling her pain, she is not receiving any relief with medications. States she has been on this medication regimen since 2009-2010. Reviewed medication history, this was discussed with Dr. Letta Pate he agrees with the change of her analgesics to [facility] and Oxycodone. [name] verbalizes understanding. Instructed to call office in two weeks to evaluate medication regime, she verbalizes understanding. She rates her pain 10.Her current exercise regime is walking  and chair exercises.  Pain Inventory Average Pain 10 Pain Right Now 10 My pain is constant, sharp, burning, stabbing and aching  In the last 24 hours, has pain interfered with the following? General activity 10 Relation with others 10 Enjoyment of life 10 What TIME of day is your pain at its worst? all Sleep (in general) Poor  Pain is worse with: walking, bending, sitting, inactivity and standing Pain improves with: rest, heat/ice and medication Relief from Meds: 1  Mobility use a cane ability to climb steps?  no use a wheelchair Do you have any goals in this area?  yes  Function retired I need assistance with the following:  meal prep, household duties and shopping  Neuro/Psych numbness trouble walking  Prior Studies Any changes since last visit?  yes  Physicians involved in your care Any changes since last visit?  yes   Family History  Problem Relation Age of Onset  . Heart disease Mother 68    CHF; CAD  . Hypertension Mother   . Heart failure Mother   . Diabetes  Sister   . Lupus Sister   . Hypertension Brother   . Hyperlipidemia Sister   . Hypertension Sister    Social History   Social History  . Marital status: Widowed    Spouse name: N/A  . Number of children: 3  . Years of education: N/A   Occupational History  . 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 Smoking status: Former Smoker    Packs/day: 0.50    Years: 20.00    Types: Cigarettes    Quit date: 07/21/2008  . Smokeless tobacco: Never Used  . Alcohol use No  . Drug use: No  . Sexual activity: Not on file   Other Topics Concern  . Not on file   Social History Narrative   Marital status: widowed      Children: 3 children      Employment: disability      Lives: alone in [place]      Tobacco: quit in 2011      Alcohol: none      Exercise: minimal   Past Surgical History:  Procedure Laterality Date  . ABDOMINAL HYSTERECTOMY    . CARDIAC CATHETERIZATION  1990's   [name]  . COLON SURGERY  05/11/11   [facility] of adhesions Crohn's  . SPINE SURGERY     Past Medical History:  Diagnosis Date  . Crohn's disease ([place])   . Disorder of sacroiliac joint   . Hyperlipidemia   .  Hypertension   . Lumbago   . Lumbar post-laminectomy syndrome   . Lumbosacral neuritis   . Lumbosacral radiculitis   . Sciatica    There were no vitals taken for this visit.  Opioid Risk Score:   Fall Risk Score:  `1  Depression screen PHQ 2/9  Depression screen [facility] 2/9 05/10/2016 03/09/2016 01/19/2016 01/15/2016 12/21/2015 11/19/2015 08/20/2015  Decreased Interest 0 0 - 0 0 0 0  Down, Depressed, Hopeless 0 0 - 0 0 0 0  PHQ - 2 Score 0 0 - 0 0 0 0  Altered sleeping - - 0 - - - 1  Tired, decreased energy - - 0 - - - 0  Change in appetite - - - - - - 1  Feeling bad or failure about yourself  - - - - - - 0  Trouble concentrating - - - - - - 0  Moving slowly or fidgety/restless - - - - - - 0  Suicidal thoughts - - - - - - 0  PHQ-9 Score - - - - - - 2  Difficult  doing work/chores - - - - - - Not difficult at all   Review of Systems  Constitutional: Negative.   HENT: Negative.   Eyes: Negative.   Respiratory: Negative.   Cardiovascular: Negative.   Gastrointestinal: Positive for nausea.  Endocrine: Negative.   Genitourinary: Negative.   Musculoskeletal: Positive for arthralgias, back pain, gait problem and neck pain.  Skin: Negative.   Allergic/Immunologic: Negative.   Neurological: Positive for numbness.  Hematological: Negative.   Psychiatric/Behavioral: Negative.   All other systems reviewed and are negative.      Objective:   Physical Exam  Constitutional: She is oriented to person, place, and time. She appears well-developed and well-nourished.  HENT:  Head: Normocephalic and atraumatic.  Neck: Normal range of motion. Neck supple.  Cardiovascular: Normal rate and regular rhythm.   Pulmonary/Chest: Effort normal and breath sounds normal.  Musculoskeletal:  Normal Muscle Bulk and Muscle Testing Reveals: Upper Extremities: Full ROM and Muscle Strength 5/5 Lumbar Hypersensitivity: Wearing Back brace Sacral Tenderness Lower Extremities: Right: Decreased ROM and Muscle Strength 4/5 Right Lower Extremity Flexion Produces Pain into Lower extremity Posteriorly Left: Full ROM and Muscle Strength 5/5 Left Lower Extremity Flexion Produces Pain into Lumbar Arises from Table slowly Antalgic Gait  Neurological: She is alert and oriented to person, place, and time.  Skin: Skin is warm and dry.  Psychiatric: She has a normal mood and affect.  Nursing note and vitals reviewed.         Assessment & Plan:  1. Acute Exacerbation of Chronic Low Back Pain: Medication Change 2.Low back pain with radiating symptoms in L3-4 distribution: continues to be limited by pain. RX: Xtampza 18 mg ER #60 and Oxycodone 10 mg BID #60. We will continue the opioid monitoring program, this consists of regular clinic visits, examinations, urine drug screen,  pill counts as well as use of [place] Controlled Substance Reporting System. Encouraged to Continue exercise regime.  2. Lumbar Radiculopathy: Continue Gabapentin 3. Chronic Right Knee Pain:No complaints today. Continue with Ice Therapy, Voltaren Gel.   20 minutes of face to face patient care time was spent during this visit. All questions were encouraged and answered.   F/U in 1 month

## 2016-05-23 ENCOUNTER — Telehealth: Payer: Self-pay | Admitting: Cardiology

## 2016-05-23 DIAGNOSIS — M960 Pseudarthrosis after fusion or arthrodesis: Secondary | ICD-10-CM | POA: Diagnosis not present

## 2016-05-23 DIAGNOSIS — Z981 Arthrodesis status: Secondary | ICD-10-CM | POA: Diagnosis not present

## 2016-05-23 DIAGNOSIS — M5136 Other intervertebral disc degeneration, lumbar region: Secondary | ICD-10-CM | POA: Diagnosis not present

## 2016-05-23 DIAGNOSIS — M48062 Spinal stenosis, lumbar region with neurogenic claudication: Secondary | ICD-10-CM | POA: Diagnosis not present

## 2016-05-23 NOTE — Telephone Encounter (Signed)
Confirmed stress test scheduled for tomorrow and all instructions. She verbalized understanding and had no further questions at this time.

## 2016-05-24 ENCOUNTER — Encounter
Admission: RE | Admit: 2016-05-24 | Discharge: 2016-05-24 | Disposition: A | Discharge status: Home / Self Care | Payer: Medicare Other | Source: Ambulatory Visit | Attending: Cardiology | Admitting: Cardiology

## 2016-05-24 DIAGNOSIS — I10 Essential (primary) hypertension: Secondary | ICD-10-CM | POA: Diagnosis not present

## 2016-05-24 DIAGNOSIS — Z01818 Encounter for other preprocedural examination: Secondary | ICD-10-CM | POA: Insufficient documentation

## 2016-05-24 MED ORDER — TECHNETIUM TC 99M TETROFOSMIN IV KIT
13.7700 | PACK | Freq: Once | INTRAVENOUS | Status: AC | PRN
  Administered 2016-05-24: 13.77 via INTRAVENOUS

## 2016-05-24 MED ORDER — REGADENOSON 0.4 MG/5ML IV SOLN
0.4000 mg | Freq: Once | INTRAVENOUS | Status: AC
  Administered 2016-05-24: 0.4 mg via INTRAVENOUS

## 2016-05-24 MED ORDER — TECHNETIUM TC 99M TETROFOSMIN IV KIT
31.7400 | PACK | Freq: Once | INTRAVENOUS | Status: AC | PRN
  Administered 2016-05-24: 31.74 via INTRAVENOUS

## 2016-05-25 LAB — NM MYOCAR MULTI W/SPECT W/WALL MOTION / EF
CHL CUP RESTING HR STRESS: 64 {beats}/min
CSEPHR: 52 %
LV sys vol: 57 mL
LVDIAVOL: 104 mL (ref 46–106)
NUC STRESS TID: 1.16
Peak HR: 85 {beats}/min
SDS: 0
SRS: 0
SSS: 3

## 2016-06-06 ENCOUNTER — Telehealth: Payer: Self-pay

## 2016-06-06 DIAGNOSIS — M960 Pseudarthrosis after fusion or arthrodesis: Secondary | ICD-10-CM | POA: Diagnosis not present

## 2016-06-06 DIAGNOSIS — I10 Essential (primary) hypertension: Secondary | ICD-10-CM | POA: Diagnosis not present

## 2016-06-06 DIAGNOSIS — K50919 Crohn's disease, unspecified, with unspecified complications: Secondary | ICD-10-CM | POA: Diagnosis not present

## 2016-06-06 DIAGNOSIS — R9431 Abnormal electrocardiogram [ECG] [EKG]: Secondary | ICD-10-CM | POA: Diagnosis not present

## 2016-06-06 DIAGNOSIS — M5136 Other intervertebral disc degeneration, lumbar region: Secondary | ICD-10-CM | POA: Diagnosis not present

## 2016-06-06 DIAGNOSIS — T502X5A Adverse effect of carbonic-anhydrase inhibitors, benzothiadiazides and other diuretics, initial encounter: Secondary | ICD-10-CM | POA: Diagnosis not present

## 2016-06-06 DIAGNOSIS — G894 Chronic pain syndrome: Secondary | ICD-10-CM | POA: Diagnosis not present

## 2016-06-06 DIAGNOSIS — E785 Hyperlipidemia, unspecified: Secondary | ICD-10-CM | POA: Diagnosis not present

## 2016-06-06 DIAGNOSIS — G629 Polyneuropathy, unspecified: Secondary | ICD-10-CM | POA: Diagnosis not present

## 2016-06-06 DIAGNOSIS — M48062 Spinal stenosis, lumbar region with neurogenic claudication: Secondary | ICD-10-CM | POA: Diagnosis not present

## 2016-06-06 DIAGNOSIS — Z01818 Encounter for other preprocedural examination: Secondary | ICD-10-CM | POA: Diagnosis not present

## 2016-06-06 DIAGNOSIS — E876 Hypokalemia: Secondary | ICD-10-CM | POA: Diagnosis not present

## 2016-06-06 DIAGNOSIS — R791 Abnormal coagulation profile: Secondary | ICD-10-CM | POA: Diagnosis not present

## 2016-06-06 DIAGNOSIS — M48061 Spinal stenosis, lumbar region without neurogenic claudication: Secondary | ICD-10-CM | POA: Diagnosis not present

## 2016-06-06 DIAGNOSIS — Z01811 Encounter for preprocedural respiratory examination: Secondary | ICD-10-CM | POA: Diagnosis not present

## 2016-06-06 DIAGNOSIS — F5101 Primary insomnia: Secondary | ICD-10-CM | POA: Diagnosis not present

## 2016-06-06 DIAGNOSIS — Z981 Arthrodesis status: Secondary | ICD-10-CM | POA: Diagnosis not present

## 2016-06-06 NOTE — Telephone Encounter (Signed)
Patient called this am stating she was following up withEunice [name] about her new medications. Asked patient were there any concerns or problems. Patient states no concerns or problems. Patient was advised that Ms.[name] was out of the office until the end of the week. If there were any questions or concerns not to hesitate and to give the office a call back. Patient understood.

## 2016-06-08 ENCOUNTER — Other Ambulatory Visit: Payer: Self-pay | Admitting: *Deleted

## 2016-06-08 MED ORDER — PROMETHAZINE HCL 25 MG PO TABS: ORAL_TABLET | ORAL | 3 refills | Status: DC

## 2016-06-09 ENCOUNTER — Ambulatory Visit (INDEPENDENT_AMBULATORY_CARE_PROVIDER_SITE_OTHER): Payer: Medicare Other

## 2016-06-09 ENCOUNTER — Other Ambulatory Visit: Payer: Self-pay

## 2016-06-09 DIAGNOSIS — Z01818 Encounter for other preprocedural examination: Secondary | ICD-10-CM | POA: Diagnosis not present

## 2016-06-09 DIAGNOSIS — I10 Essential (primary) hypertension: Secondary | ICD-10-CM | POA: Diagnosis not present

## 2016-06-15 ENCOUNTER — Encounter: Payer: Medicare Other | Attending: Physical Medicine & Rehabilitation | Admitting: Registered Nurse

## 2016-06-15 ENCOUNTER — Encounter: Payer: Self-pay | Admitting: Registered Nurse

## 2016-06-15 VITALS — BP 128/84 | HR 77

## 2016-06-15 DIAGNOSIS — M5417 Radiculopathy, lumbosacral region: Secondary | ICD-10-CM | POA: Insufficient documentation

## 2016-06-15 DIAGNOSIS — G894 Chronic pain syndrome: Secondary | ICD-10-CM | POA: Diagnosis not present

## 2016-06-15 DIAGNOSIS — M961 Postlaminectomy syndrome, not elsewhere classified: Secondary | ICD-10-CM | POA: Diagnosis not present

## 2016-06-15 DIAGNOSIS — M533 Sacrococcygeal disorders, not elsewhere classified: Secondary | ICD-10-CM

## 2016-06-15 DIAGNOSIS — M5414 Radiculopathy, thoracic region: Secondary | ICD-10-CM | POA: Insufficient documentation

## 2016-06-15 DIAGNOSIS — Z5181 Encounter for therapeutic drug level monitoring: Secondary | ICD-10-CM | POA: Diagnosis not present

## 2016-06-15 DIAGNOSIS — Z79899 Other long term (current) drug therapy: Secondary | ICD-10-CM

## 2016-06-15 MED ORDER — OXYCODONE HCL 10 MG PO TABS: 10.0000 mg | ORAL_TABLET | Freq: Two times a day (BID) | ORAL | 0 refills | Status: DC | PRN

## 2016-06-15 MED ORDER — OXYCODONE ER 18 MG PO C12A: 18.0000 mg | EXTENDED_RELEASE_CAPSULE | Freq: Two times a day (BID) | ORAL | 0 refills | Status: DC

## 2016-06-15 NOTE — Progress Notes (Signed)
Subjective:    Patient ID: [name] [name], female    DOB: December 23, 1955, 60 y.o.   MRN: [id number]  HPI:  [name] [name] is a 60 year old female who returns for follow up for chronic pain and medication refill.She states her pain is located in her left lower back, also states the new analgesics is not providing her pain relief. We will continue current medication regime at this time, she verbalizes understanding. I will speak with Dr. Letta Pate and give her a call she verbalizes understanding.  She's schedule for Bilateral re-exploration Lumbar Decompression L-2-3, L3-L4, L-4-%, L5-S1 metal removal (expedium) L2-3, L5-S1 Fusion exploration L4-5, L5-S1 Repair Non-Union with Re instrumentation Left with Dr. Velna Ochs.  Pain Inventory Average Pain 8 Pain Right Now 9 My pain is constant, burning, stabbing, tingling and aching  In the last 24 hours, has pain interfered with the following? General activity 9 Relation with others 9 Enjoyment of life 9 What TIME of day is your pain at its worst? all Sleep (in general) Poor  Pain is worse with: walking, bending, sitting, inactivity and standing Pain improves with: rest, heat/ice and medication Relief from Meds: 2  Mobility use a cane ability to climb steps?  no do you drive?  yes use a wheelchair Do you have any goals in this area?  yes  Function disabled: date disabled 2008 I need assistance with the following:  meal prep, household duties and shopping Do you have any goals in this area?  yes  Neuro/Psych numbness tingling trouble walking spasms  Prior Studies Any changes since last visit?  no  Physicians involved in your care Any changes since last visit?  no   Family History  Problem Relation Age of Onset  . Heart disease Mother 69    CHF; CAD  . Hypertension Mother   . Heart failure Mother   . Diabetes Sister   . Lupus Sister   . Hypertension Brother   . Hyperlipidemia Sister   . Hypertension Sister     Social History   Social History  . Marital status: Widowed    Spouse name: N/A  . Number of children: 3  . Years of education: N/A   Occupational History  . 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 Smoking status: Former Smoker    Packs/day: 0.50    Years: 20.00    Types: Cigarettes    Quit date: 07/21/2008  . Smokeless tobacco: Never Used  . Alcohol use No  . Drug use: No  . Sexual activity: Not on file   Other Topics Concern  . Not on file   Social History Narrative   Marital status: widowed      Children: 3 children      Employment: disability      Lives: alone in [place]      Tobacco: quit in 2011      Alcohol: none      Exercise: minimal   Past Surgical History:  Procedure Laterality Date  . ABDOMINAL HYSTERECTOMY    . CARDIAC CATHETERIZATION  1990's   [name]  . COLON SURGERY  05/11/11   [facility] of [facility]ions Crohn's  . SPINE SURGERY     Past Medical History:  Diagnosis Date  . Crohn's disease ([place])   . Disorder of sacroiliac joint   . Hyperlipidemia   . Hypertension   . Lumbago   . Lumbar post-laminectomy syndrome   .  Lumbosacral neuritis   . Lumbosacral radiculitis   . Sciatica    There were no vitals taken for this visit.  Opioid Risk Score:   Fall Risk Score:  `1  Depression screen PHQ 2/9  Depression screen [facility] 2/9 05/10/2016 03/09/2016 01/19/2016 01/15/2016 12/21/2015 11/19/2015 08/20/2015  Decreased Interest 0 0 - 0 0 0 0  Down, Depressed, Hopeless 0 0 - 0 0 0 0  PHQ - 2 Score 0 0 - 0 0 0 0  Altered sleeping - - 0 - - - 1  Tired, decreased energy - - 0 - - - 0  Change in appetite - - - - - - 1  Feeling bad or failure about yourself  - - - - - - 0  Trouble concentrating - - - - - - 0  Moving slowly or fidgety/restless - - - - - - 0  Suicidal thoughts - - - - - - 0  PHQ-9 Score - - - - - - 2  Difficult doing work/chores - - - - - - Not difficult at all   Review of Systems  Constitutional: Negative.    HENT: Negative.   Eyes: Negative.   Respiratory: Negative.   Cardiovascular: Negative.   Gastrointestinal: Negative.   Endocrine: Negative.   Genitourinary: Negative.   Musculoskeletal: Positive for back pain and gait problem.       Spasms  Skin: Negative.   Allergic/Immunologic: Negative.   Neurological: Positive for numbness.       Tingling  Hematological: Negative.   Psychiatric/Behavioral: Negative.   All other systems reviewed and are negative.      Objective:   Physical Exam  Constitutional: She is oriented to person, place, and time. She appears well-developed and well-nourished.  HENT:  Head: Normocephalic and atraumatic.  Neck: Normal range of motion. Neck supple.  Cardiovascular: Normal rate and regular rhythm.   Pulmonary/Chest: Effort normal and breath sounds normal.  Musculoskeletal:  Normal Muscle Bulk and Muscle Testing Reveals: Upper Extremities: Full ROM and Muscle Strength 5/5 Lumbar Paraspinal Tenderness: L-4-L-5 Mainly Left Side Sacral Tenderness Lower Extremities: Full ROM and Muscle Strength 5/5 Arises from Table Slowly using straight cane for support Antalgic Gait  Neurological: She is alert and oriented to person, place, and time.  Skin: Skin is warm and dry.  Psychiatric: She has a normal mood and affect.  Nursing note and vitals reviewed.         Assessment & Plan:  1.Low back pain with radiating symptoms in L3-4 distribution: continues to be limited by pain. RX: Xtampza 18 mg ER #60 and Oxycodone 10 mg BID #60. We will continue the opioid monitoring program, this consists of regular clinic visits, examinations, urine drug screen, pill counts as well as use of [place] Controlled Substance Reporting System. Encouraged to Continue exercise regime.  2. Lumbar Radiculopathy: Continue Gabapentin 3. Chronic Right Knee Pain:No complaints today. Continue with Ice Therapy, Voltaren Gel.   20 minutes of face to face patient care time was  spent during this visit. All questions were encouraged and answered.   F/U in 1 month

## 2016-06-17 ENCOUNTER — Ambulatory Visit: Payer: Medicare Other | Admitting: Registered Nurse

## 2016-06-28 ENCOUNTER — Other Ambulatory Visit: Payer: Self-pay | Admitting: Urgent Care

## 2016-06-28 DIAGNOSIS — Z79890 Hormone replacement therapy (postmenopausal): Secondary | ICD-10-CM

## 2016-07-12 ENCOUNTER — Encounter: Payer: Medicare Other | Attending: Physical Medicine & Rehabilitation | Admitting: Registered Nurse

## 2016-07-12 ENCOUNTER — Encounter: Payer: Self-pay | Admitting: Registered Nurse

## 2016-07-12 VITALS — BP 127/73 | HR 87 | Resp 14

## 2016-07-12 DIAGNOSIS — G894 Chronic pain syndrome: Secondary | ICD-10-CM

## 2016-07-12 DIAGNOSIS — M7062 Trochanteric bursitis, left hip: Secondary | ICD-10-CM

## 2016-07-12 DIAGNOSIS — Z5181 Encounter for therapeutic drug level monitoring: Secondary | ICD-10-CM

## 2016-07-12 DIAGNOSIS — M5414 Radiculopathy, thoracic region: Secondary | ICD-10-CM | POA: Diagnosis not present

## 2016-07-12 DIAGNOSIS — M5416 Radiculopathy, lumbar region: Secondary | ICD-10-CM

## 2016-07-12 DIAGNOSIS — Z79899 Other long term (current) drug therapy: Secondary | ICD-10-CM

## 2016-07-12 DIAGNOSIS — M5417 Radiculopathy, lumbosacral region: Secondary | ICD-10-CM | POA: Insufficient documentation

## 2016-07-12 DIAGNOSIS — M961 Postlaminectomy syndrome, not elsewhere classified: Secondary | ICD-10-CM

## 2016-07-12 MED ORDER — OXYCODONE ER 18 MG PO C12A: 18.0000 mg | EXTENDED_RELEASE_CAPSULE | Freq: Two times a day (BID) | ORAL | 0 refills | Status: DC

## 2016-07-12 MED ORDER — OXYCODONE HCL 10 MG PO TABS: 10.0000 mg | ORAL_TABLET | Freq: Two times a day (BID) | ORAL | 0 refills | Status: DC | PRN

## 2016-07-12 NOTE — Progress Notes (Signed)
Subjective:    Patient ID: [name] [name], female    DOB: Apr 14, 1956, 61 y.o.   MRN: [id number]  HPI: [name] [name] is a 61 year old female who returns for follow up appointment for chronic pain and medication refill.She states her pain is located in her  lower back mainly left side radiating into her left hip and left lower extremity laterally and bilateral hip pain L>R. She rates her pain 9. Her current exercise regime is walking.  She's schedule for Bilateral re-exploration Lumbar Decompression L-2-3, L3-L4, L-4- 5 L5-S1 metal removal (expedium) L2-3, L5-S1 Fusion exploration L4-5, L5-S1 Repair Non-Union with Re instrumentation Left iliac crest bone graft with Dr. Velna Ochs on 07/22/2016.  In 2016 [name] had Bilateral Re-exploration Lumbar Decompression, excision and biopsy synovial Facet Cyst Left 2-3. Her post surgical medications were covered by Dr. Letta Pate. At that time she was on MSIR and medication was increased to QID. This was discussed  with Dr. Letta Pate regarding the above. We will increase her Oxycodone to QID post surgical Procedure. [name] was instructed to call our office when she will be discharged, she verbalizes understanding.    Pain Inventory Average Pain 10 Pain Right Now 9 My pain is constant, sharp, burning, stabbing, tingling and aching  In the last 24 hours, has pain interfered with the following? General activity 10 Relation with others 10 Enjoyment of life 10 What TIME of day is your pain at its worst? All Sleep (in general) Poor  Pain is worse with: walking, bending, sitting, inactivity and standing Pain improves with: rest, heat/ice and medication Relief from Meds: 1  Mobility use a cane how many minutes can you walk? 5 ability to climb steps?  yes do you drive?  yes use a wheelchair Do you have any goals in this area?  yes  Function not employed: date last employed 08-2006 disabled: date disabled 10-2007 I need assistance with  the following:  meal prep, household duties and shopping  Neuro/Psych trouble walking spasms  Prior Studies Any changes since last visit?  yes  Physicians involved in your care Any changes since last visit?  no   Family History  Problem Relation Age of Onset  . Heart disease Mother 35    CHF; CAD  . Hypertension Mother   . Heart failure Mother   . Diabetes Sister   . Lupus Sister   . Hypertension Brother   . Hyperlipidemia Sister   . Hypertension Sister    Social History   Social History  . Marital status: Widowed    Spouse name: N/A  . Number of children: 3  . Years of education: N/A   Occupational History  . 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 Smoking status: Former Smoker    Packs/day: 0.50    Years: 20.00    Types: Cigarettes    Quit date: 07/21/2008  . Smokeless tobacco: Never Used  . Alcohol use No  . Drug use: No  . Sexual activity: Not Asked   Other Topics Concern  . None   Social History Narrative   Marital status: widowed      Children: 3 children      Employment: disability      Lives: alone in [place]      Tobacco: quit in 2011      Alcohol: none      Exercise: minimal   Past Surgical History:  Procedure Laterality  Date  . ABDOMINAL HYSTERECTOMY    . CARDIAC CATHETERIZATION  1990's   [name] [name]  . COLON SURGERY  05/11/11   [facility] Lysis of adhesions Crohn's  . SPINE SURGERY     Past Medical History:  Diagnosis Date  . Crohn's disease ([place])   . Disorder of sacroiliac joint   . Hyperlipidemia   . Hypertension   . Lumbago   . Lumbar post-laminectomy syndrome   . Lumbosacral neuritis   . Lumbosacral radiculitis   . Sciatica    BP 127/73   Pulse 87   Resp 14   SpO2 95%   Opioid Risk Score:   Fall Risk Score:  `1  Depression screen PHQ 2/9  Depression screen [facility] 2/9 05/10/2016 03/09/2016 01/19/2016 01/15/2016 12/21/2015 11/19/2015 08/20/2015  Decreased Interest 0 0 - 0 0 0 0  Down, Depressed,  Hopeless 0 0 - 0 0 0 0  PHQ - 2 Score 0 0 - 0 0 0 0  Altered sleeping - - 0 - - - 1  Tired, decreased energy - - 0 - - - 0  Change in appetite - - - - - - 1  Feeling bad or failure about yourself  - - - - - - 0  Trouble concentrating - - - - - - 0  Moving slowly or fidgety/restless - - - - - - 0  Suicidal thoughts - - - - - - 0  PHQ-9 Score - - - - - - 2  Difficult doing work/chores - - - - - - Not difficult at all      Review of Systems  Constitutional: Positive for appetite change.       Night sweats  HENT: Negative.   Eyes: Negative.   Respiratory: Negative.   Cardiovascular: Negative.   Gastrointestinal: Negative.   Endocrine: Negative.   Genitourinary: Negative.   Musculoskeletal: Negative.   Skin: Negative.   Allergic/Immunologic: Negative.   Neurological: Negative.   Hematological: Negative.   Psychiatric/Behavioral: Negative.   All other systems reviewed and are negative.      Objective:   Physical Exam  Constitutional: She is oriented to person, place, and time. She appears well-developed and well-nourished.  HENT:  Head: Normocephalic and atraumatic.  Neck: Normal range of motion. Neck supple.  Cardiovascular: Normal rate and regular rhythm.   Pulmonary/Chest: Effort normal and breath sounds normal.  Musculoskeletal:  Normal Muscle Bulk and Muscle Testing Reveals: Upper Extremities: Full ROM and Muscle Strength 5/5 Thoracic Paraspinal Tenderness: T-1-T-3 Mainly Left Side Lumbar Hyper-Sensitivity Left Greater Trochanteric Tenderness Lower Extremities: Right: Full ROM and Muscle Strength 5/5 Left: Decreased ROM and Muscle Strength 4/5 Left Lower Extremity Flexion Produces Pain into Lumbar and Left Hip Arises from Table Slowly using Straight Cane for Support Antalgic Gait   Neurological: She is alert and oriented to person, place, and time.  Skin: Skin is warm and dry.  Psychiatric: She has a normal mood and affect.  Nursing note and vitals  reviewed.         Assessment & Plan:  1.Low back pain with radiating symptoms in L3-4 distribution: continues to be limited by pain. Refillede: Xtampza 18 mg ER #60 and Oxycodone 10 mg BID #60. We will continue the opioid monitoring program, this consists of regular clinic visits, examinations, urine drug screen, pill counts as well as use of [place] Controlled Substance Reporting System. Encouraged to Continue exercise regime.  2. Lumbar Radiculopathy: Continue Gabapentin 3. Chronic Right Knee Pain:No complaints  today. Continue with Ice Therapy, Voltaren Gel.  4. Left Greater Trochanteric Bursitis: Continue to Monitor  20 minutes of face to face patient care time was spent during this visit. All questions were encouraged and answered.   F/U in 1 month

## 2016-07-14 DIAGNOSIS — M960 Pseudarthrosis after fusion or arthrodesis: Secondary | ICD-10-CM | POA: Diagnosis not present

## 2016-07-14 DIAGNOSIS — M48062 Spinal stenosis, lumbar region with neurogenic claudication: Secondary | ICD-10-CM | POA: Diagnosis not present

## 2016-07-14 DIAGNOSIS — M5136 Other intervertebral disc degeneration, lumbar region: Secondary | ICD-10-CM | POA: Diagnosis not present

## 2016-07-14 DIAGNOSIS — Z981 Arthrodesis status: Secondary | ICD-10-CM | POA: Diagnosis not present

## 2016-07-22 DIAGNOSIS — M4716 Other spondylosis with myelopathy, lumbar region: Secondary | ICD-10-CM | POA: Diagnosis not present

## 2016-07-22 DIAGNOSIS — M1990 Unspecified osteoarthritis, unspecified site: Secondary | ICD-10-CM | POA: Diagnosis not present

## 2016-07-22 DIAGNOSIS — G894 Chronic pain syndrome: Secondary | ICD-10-CM | POA: Diagnosis not present

## 2016-07-22 DIAGNOSIS — M48062 Spinal stenosis, lumbar region with neurogenic claudication: Secondary | ICD-10-CM | POA: Diagnosis not present

## 2016-07-22 DIAGNOSIS — M960 Pseudarthrosis after fusion or arthrodesis: Secondary | ICD-10-CM | POA: Diagnosis not present

## 2016-07-22 DIAGNOSIS — M5136 Other intervertebral disc degeneration, lumbar region: Secondary | ICD-10-CM | POA: Diagnosis not present

## 2016-07-22 DIAGNOSIS — M4807 Spinal stenosis, lumbosacral region: Secondary | ICD-10-CM | POA: Diagnosis not present

## 2016-07-22 DIAGNOSIS — G834 Cauda equina syndrome: Secondary | ICD-10-CM | POA: Diagnosis not present

## 2016-07-22 DIAGNOSIS — M47816 Spondylosis without myelopathy or radiculopathy, lumbar region: Secondary | ICD-10-CM | POA: Diagnosis not present

## 2016-07-22 DIAGNOSIS — E876 Hypokalemia: Secondary | ICD-10-CM | POA: Diagnosis not present

## 2016-07-22 DIAGNOSIS — K50919 Crohn's disease, unspecified, with unspecified complications: Secondary | ICD-10-CM | POA: Diagnosis not present

## 2016-07-22 DIAGNOSIS — E785 Hyperlipidemia, unspecified: Secondary | ICD-10-CM | POA: Diagnosis not present

## 2016-07-22 DIAGNOSIS — Z981 Arthrodesis status: Secondary | ICD-10-CM | POA: Diagnosis not present

## 2016-07-22 DIAGNOSIS — I10 Essential (primary) hypertension: Secondary | ICD-10-CM | POA: Diagnosis not present

## 2016-07-22 DIAGNOSIS — Z87891 Personal history of nicotine dependence: Secondary | ICD-10-CM | POA: Diagnosis not present

## 2016-07-22 DIAGNOSIS — M4316 Spondylolisthesis, lumbar region: Secondary | ICD-10-CM | POA: Diagnosis not present

## 2016-07-22 DIAGNOSIS — Z79899 Other long term (current) drug therapy: Secondary | ICD-10-CM | POA: Diagnosis not present

## 2016-07-22 DIAGNOSIS — K5010 Crohn's disease of large intestine without complications: Secondary | ICD-10-CM | POA: Diagnosis not present

## 2016-07-22 DIAGNOSIS — M5431 Sciatica, right side: Secondary | ICD-10-CM | POA: Diagnosis not present

## 2016-07-25 ENCOUNTER — Telehealth: Payer: Self-pay | Admitting: *Deleted

## 2016-07-25 NOTE — Telephone Encounter (Signed)
[name] [name] called to let [name] know that she was released from the hospital yesterday (07/24/16) and was told to call [name] about her medication.

## 2016-07-25 NOTE — Telephone Encounter (Signed)
Return [name] [name] call. Regardingher post op-pain We will increase her Oxycodone 10 mg  to QID.  The above was discussed with [name] [name], he agrees with plan. The [name] [name] was reviewed she picked up her prescription on 07/18/2016.  [name] [name] verbalizes understanding and instructed to call office next week, she verbalizes understanding.

## 2016-07-26 ENCOUNTER — Telehealth: Payer: Self-pay

## 2016-07-26 ENCOUNTER — Other Ambulatory Visit: Payer: Self-pay

## 2016-07-26 NOTE — Telephone Encounter (Signed)
Patient called stating was unable to refill promethazine, stated on med list that she had 3 refills from dec fill on this medication, please advise

## 2016-07-26 NOTE — Telephone Encounter (Signed)
Called stating was having issue refilling prescription for phenergan, on medications it states she should still have a few more refills available, need to know which pharmacy is the preferred to call and order the medication by voice

## 2016-07-27 MED ORDER — PROMETHAZINE HCL 25 MG PO TABS: ORAL_TABLET | ORAL | 3 refills | Status: DC

## 2016-07-28 ENCOUNTER — Other Ambulatory Visit: Payer: Self-pay | Admitting: *Deleted

## 2016-07-28 DIAGNOSIS — R42 Dizziness and giddiness: Secondary | ICD-10-CM | POA: Diagnosis not present

## 2016-07-28 DIAGNOSIS — M961 Postlaminectomy syndrome, not elsewhere classified: Secondary | ICD-10-CM | POA: Diagnosis not present

## 2016-07-28 DIAGNOSIS — M960 Pseudarthrosis after fusion or arthrodesis: Secondary | ICD-10-CM | POA: Diagnosis not present

## 2016-07-28 DIAGNOSIS — K50919 Crohn's disease, unspecified, with unspecified complications: Secondary | ICD-10-CM | POA: Diagnosis not present

## 2016-07-28 DIAGNOSIS — I10 Essential (primary) hypertension: Secondary | ICD-10-CM | POA: Diagnosis not present

## 2016-07-28 MED ORDER — GABAPENTIN 300 MG PO CAPS: ORAL_CAPSULE | ORAL | 3 refills | Status: DC

## 2016-08-01 ENCOUNTER — Telehealth: Payer: Self-pay | Admitting: Registered Nurse

## 2016-08-01 MED ORDER — OXYCODONE HCL 10 MG PO TABS
10.0000 mg | ORAL_TABLET | Freq: Four times a day (QID) | ORAL | 0 refills | Status: DC | PRN
Start: 2016-08-01 — End: 2016-08-26

## 2016-08-01 NOTE — Telephone Encounter (Signed)
Received a call from Ms. Totaro, her Oxycodone was increased to QID, post surgical pain. Her current prescription will run out on 08/06/2015. New prescription printed. Dhe verbalizes understanding.

## 2016-08-11 ENCOUNTER — Encounter: Payer: Medicare Other | Attending: Physical Medicine & Rehabilitation

## 2016-08-11 ENCOUNTER — Ambulatory Visit (HOSPITAL_BASED_OUTPATIENT_CLINIC_OR_DEPARTMENT_OTHER): Payer: Medicare Other | Admitting: Physical Medicine & Rehabilitation

## 2016-08-11 ENCOUNTER — Encounter: Payer: Self-pay | Admitting: Physical Medicine & Rehabilitation

## 2016-08-11 VITALS — BP 121/64 | HR 63 | Resp 14

## 2016-08-11 DIAGNOSIS — M961 Postlaminectomy syndrome, not elsewhere classified: Secondary | ICD-10-CM | POA: Insufficient documentation

## 2016-08-11 DIAGNOSIS — Z79899 Other long term (current) drug therapy: Secondary | ICD-10-CM

## 2016-08-11 DIAGNOSIS — G8918 Other acute postprocedural pain: Secondary | ICD-10-CM

## 2016-08-11 DIAGNOSIS — M5414 Radiculopathy, thoracic region: Secondary | ICD-10-CM | POA: Insufficient documentation

## 2016-08-11 DIAGNOSIS — M5417 Radiculopathy, lumbosacral region: Secondary | ICD-10-CM | POA: Insufficient documentation

## 2016-08-11 DIAGNOSIS — Z5181 Encounter for therapeutic drug level monitoring: Secondary | ICD-10-CM | POA: Diagnosis not present

## 2016-08-11 MED ORDER — OXYCODONE ER 18 MG PO C12A: 18.0000 mg | EXTENDED_RELEASE_CAPSULE | Freq: Two times a day (BID) | ORAL | 0 refills | Status: DC

## 2016-08-11 NOTE — Progress Notes (Signed)
Subjective:    Patient ID: [name] [name], female    DOB: [birth year], 61 y.o.   MRN: [id number] 07/22/2016 THE PATIENT UNDERWENT UNCOMPLICATED BILATERAL RE-EXPLORATION LUMBAR DECOMPRESSION/EXPLORATION OF FUSION MASSES L2, L3, L4, L5, S1, INSTRUMENTATION REMOVAL L2-3, L5-S1 BILATERAL, REPAIR OF CRACK-LIKE NONUNION L5-S1 RIGHT WITH BILATERAL RE-INSTRUMENTATION L5-S1 LOCAL AUTOGRAFT/ALLOGRAFT PLACED BILATERALLY  HPI Patient underwent surgery by Dr. Napoleon Form as above, to 08/09/2016. She is staying with her daughter. She is using a walker to ambulate. She was hospitalized from February 2 to February 4.   Patient does not have home health currently. Independent with dressing and bathing. Ambulates independently with a rolling walker. preoperative was using a cane up until the surgery but prior to that did not require a cane. Had follow-up appointment with spine surgeon in 08/01/2016. Follow-up appointment on March 5, no postoperative complications  Preop pain Left low back radiating to Left knee. Post op pain Left side low back but not in Left knee Post Operative good bowel movements No falls. Pain Inventory Average Pain 7 Pain Right Now 6 My pain is constant, dull and aching  In the last 24 hours, has pain interfered with the following? General activity 5 Relation with others 5 Enjoyment of life 5 What TIME of day is your pain at its worst? morning, night Sleep (in general) Fair  Pain is worse with: inactivity Pain improves with: rest, heat/ice and medication Relief from Meds: 7  Mobility use a walker how many minutes can you walk? 3 do you drive?  no use a wheelchair Do you have any goals in this area?  yes  Function not employed: date last employed 08/2006 disabled: date disabled 10/2007 I need assistance with the following:  bathing, meal prep, household duties and shopping  Neuro/Psych No problems in this area  Prior Studies Any changes since last visit?   no  Physicians involved in your care Any changes since last visit?  no   Family History  Problem Relation Age of Onset  . Heart disease Mother 35    CHF; CAD  . Hypertension Mother   . Heart failure Mother   . Diabetes Sister   . Lupus Sister   . Hypertension Brother   . Hyperlipidemia Sister   . Hypertension Sister    Social History   Social History  . Marital status: Widowed    Spouse name: N/A  . Number of children: 3  . Years of education: N/A   Occupational History  . 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 Smoking status: Former Smoker    Packs/day: 0.50    Years: 20.00    Types: Cigarettes    Quit date: 07/21/2008  . Smokeless tobacco: Never Used  . Alcohol use No  . Drug use: No  . Sexual activity: Not Asked   Other Topics Concern  . None   Social History Narrative   Marital status: widowed      Children: 3 children      Employment: disability      Lives: alone in [place]      Tobacco: quit in 2011      Alcohol: none      Exercise: minimal   Past Surgical History:  Procedure Laterality Date  . ABDOMINAL HYSTERECTOMY    . CARDIAC CATHETERIZATION  1990's   [name]  . COLON SURGERY  05/11/11   [facility] Lysis of adhesions Crohn's  . SPINE SURGERY  Past Medical History:  Diagnosis Date  . Crohn's disease ([place])   . Disorder of sacroiliac joint   . Hyperlipidemia   . Hypertension   . Lumbago   . Lumbar post-laminectomy syndrome   . Lumbosacral neuritis   . Lumbosacral radiculitis   . Sciatica    BP 121/64   Pulse 63   Resp 14   SpO2 93%   Opioid Risk Score:   Fall Risk Score:  `1  Depression screen PHQ 2/9  Depression screen [facility] 2/9 05/10/2016 03/09/2016 01/19/2016 01/15/2016 12/21/2015 11/19/2015 08/20/2015  Decreased Interest 0 0 - 0 0 0 0  Down, Depressed, Hopeless 0 0 - 0 0 0 0  PHQ - 2 Score 0 0 - 0 0 0 0  Altered sleeping - - 0 - - - 1  Tired, decreased energy - - 0 - - - 0  Change in appetite - - -  - - - 1  Feeling bad or failure about yourself  - - - - - - 0  Trouble concentrating - - - - - - 0  Moving slowly or fidgety/restless - - - - - - 0  Suicidal thoughts - - - - - - 0  PHQ-9 Score - - - - - - 2  Difficult doing work/chores - - - - - - Not difficult at all    Review of Systems  Constitutional: Negative.   HENT: Negative.   Eyes: Negative.   Respiratory: Negative.   Cardiovascular: Negative.   Gastrointestinal: Negative.   Endocrine: Negative.   Genitourinary: Negative.   Musculoskeletal: Positive for back pain and gait problem.  Allergic/Immunologic: Negative.   Hematological: Negative.   Psychiatric/Behavioral: Negative.   All other systems reviewed and are negative.      Objective:   Physical Exam  Constitutional: She is oriented to person, place, and time. She appears well-developed and well-nourished.  HENT:  Head: Normocephalic and atraumatic.  Eyes: Conjunctivae are normal. Pupils are equal, round, and reactive to light.  Neck: Normal range of motion.  Cardiovascular: Normal rate, regular rhythm and normal heart sounds.  Exam reveals no friction rub.   No murmur heard. Pulmonary/Chest: Effort normal and breath sounds normal. No respiratory distress. She has no wheezes.  Abdominal: Soft. Bowel sounds are normal. She exhibits no distension. There is no tenderness.  Musculoskeletal:       Right knee: Normal.       Left knee: She exhibits normal range of motion, no swelling and no effusion. No tenderness found.  Neurological: She is alert and oriented to person, place, and time. She displays no atrophy. She exhibits normal muscle tone.  Motor strength is 5/5 bilateral hip flexor, knee extensor, ankle dorsiflexor.  Psychiatric: She has a normal mood and affect.  Nursing note and vitals reviewed.  Negative straight leg raise Lumbar incision, clean, dry and intact, Steri-Strips Left knee has no pain to palpation. There is no evidence of knee  effusion Sensation normal to light touch bilateral lower limbs    Assessment & Plan:  1. Lumbar postlaminectomy syndrome. Has had numerous lumbar surgeries. Most recently, as above. Continue for the next month. The increased dose of oxycodone 10 mg 4 times a day. Then, I would reduce to 3 times a day. The following month and then back to 2 times per day.  Reduce gabapentin to 3 times a day, if leg pain increases, we'll need to go back to 4 times a day  We discussed medication changes  as well as progressively reducing short acting oxycodone. She should have 3 weeks of oxycodone at 4 times per day, may need to reduce the last 8-3 times a day prior to the next appointment in 3 weeks with nurse practitioner.  Prescription for Xtampza given today

## 2016-08-11 NOTE — Patient Instructions (Addendum)
We'll continue the 4 times per day, oxycodone 10 mg for the next month. Then reduce to 3 times a day for one month, then reduce to 2 times a day for one month.  Try reducing gabapentin to 3 times a day,  if  leg pain increases, then I would go back to 4 times a day. If the leg pain stays the same tell [name] and next month. You may go down to twice a day

## 2016-08-17 LAB — TOXASSURE SELECT,+ANTIDEPR,UR

## 2016-08-22 ENCOUNTER — Telehealth: Payer: Self-pay

## 2016-08-22 DIAGNOSIS — M5136 Other intervertebral disc degeneration, lumbar region: Secondary | ICD-10-CM | POA: Diagnosis not present

## 2016-08-22 DIAGNOSIS — M47816 Spondylosis without myelopathy or radiculopathy, lumbar region: Secondary | ICD-10-CM | POA: Diagnosis not present

## 2016-08-22 DIAGNOSIS — Z981 Arthrodesis status: Secondary | ICD-10-CM | POA: Diagnosis not present

## 2016-08-22 NOTE — Telephone Encounter (Signed)
Patient states she will be out of her medication five days before her 3/19th appointment. Patient states she would like a call back from NP if possble.  Please advise

## 2016-08-23 ENCOUNTER — Encounter: Payer: Medicare Other | Admitting: Family Medicine

## 2016-08-23 ENCOUNTER — Telehealth: Payer: Self-pay | Admitting: Registered Nurse

## 2016-08-23 NOTE — Telephone Encounter (Signed)
Spoke with Ms. Oppermann she states her appointment is passed her medication re-fill date.  [facility] was reviewed, Ms. Arnzen Oxycodone was picked up on 08/03/2016 her appointment was scheduled for  09/05/16. Ms. Accurso appointment changed to 08/27/2015 at 11:00 am , she verbalizes understanding.

## 2016-08-23 NOTE — Telephone Encounter (Signed)
Return the call, no answer. Left message return the call.

## 2016-08-25 DIAGNOSIS — M960 Pseudarthrosis after fusion or arthrodesis: Secondary | ICD-10-CM | POA: Diagnosis not present

## 2016-08-25 DIAGNOSIS — I10 Essential (primary) hypertension: Secondary | ICD-10-CM | POA: Diagnosis not present

## 2016-08-26 ENCOUNTER — Telehealth: Payer: Self-pay | Admitting: Registered Nurse

## 2016-08-26 ENCOUNTER — Encounter: Payer: Self-pay | Admitting: Registered Nurse

## 2016-08-26 ENCOUNTER — Encounter: Payer: Medicare Other | Attending: Physical Medicine & Rehabilitation | Admitting: Registered Nurse

## 2016-08-26 VITALS — BP 131/70 | HR 61 | Resp 14

## 2016-08-26 DIAGNOSIS — Z5181 Encounter for therapeutic drug level monitoring: Secondary | ICD-10-CM

## 2016-08-26 DIAGNOSIS — M5414 Radiculopathy, thoracic region: Secondary | ICD-10-CM | POA: Insufficient documentation

## 2016-08-26 DIAGNOSIS — M5417 Radiculopathy, lumbosacral region: Secondary | ICD-10-CM | POA: Insufficient documentation

## 2016-08-26 DIAGNOSIS — M961 Postlaminectomy syndrome, not elsewhere classified: Secondary | ICD-10-CM | POA: Diagnosis not present

## 2016-08-26 DIAGNOSIS — G894 Chronic pain syndrome: Secondary | ICD-10-CM

## 2016-08-26 DIAGNOSIS — M48061 Spinal stenosis, lumbar region without neurogenic claudication: Secondary | ICD-10-CM | POA: Diagnosis not present

## 2016-08-26 DIAGNOSIS — Z79899 Other long term (current) drug therapy: Secondary | ICD-10-CM

## 2016-08-26 DIAGNOSIS — G8918 Other acute postprocedural pain: Secondary | ICD-10-CM | POA: Diagnosis not present

## 2016-08-26 MED ORDER — OXYCODONE HCL 10 MG PO TABS: 10.0000 mg | ORAL_TABLET | Freq: Four times a day (QID) | ORAL | 0 refills | Status: DC | PRN

## 2016-08-26 MED ORDER — OXYCODONE ER 18 MG PO C12A: 18.0000 mg | EXTENDED_RELEASE_CAPSULE | Freq: Two times a day (BID) | ORAL | 0 refills | Status: DC

## 2016-08-26 NOTE — Telephone Encounter (Signed)
On 08/26/2016 the [place] was reviewed no conflict was seen on the [place] with multiple prescribers. Ms. Grimes a signed narcotic contract with our office. If there were any discrepancies this would have been reported to her physician.

## 2016-08-26 NOTE — Progress Notes (Signed)
Subjective:    Patient ID: [name] [name], female    DOB: 27-Mar-1956, 61 y.o.   MRN: [id number]  HPI: Ms. Ellen Goris is a 61 year old female who returns for follow up appointment for chronic pain and medication refill.She states her pain is located in her  lower back .She rates her pain 6. Her current exercise regime is walking with walker.  She underwent uncomplicated  Bilateral re-exploration Lumbar Decompression L-2-3, L3-L4, L-4- 5 L5-S1 metal removal (expedium) L2-3, L5-S1 Fusion exploration L4-5, L5-S1 Repair Non-Union with Re instrumentation Left iliac crest bone graft with Dr. Velna Ochs on 07/22/2016.  Pain Inventory Average Pain 6 Pain Right Now 6 My pain is constant, dull and aching  In the last 24 hours, has pain interfered with the following? General activity 7 Relation with others 7 Enjoyment of life 7 What TIME of day is your pain at its worst? morning, night Sleep (in general) Fair  Pain is worse with: inactivity Pain improves with: medication Relief from Meds: 5  Mobility walk with assistance use a walker do you drive?  no use a wheelchair Do you have any goals in this area?  yes  Function disabled: date disabled . I need assistance with the following:  meal prep, household duties and shopping Do you have any goals in this area?  yes  Neuro/Psych trouble walking  Prior Studies Any changes since last visit?  no  Physicians involved in your care Any changes since last visit?  no   Family History  Problem Relation Age of Onset  . Heart disease Mother 46    CHF; CAD  . Hypertension Mother   . Heart failure Mother   . Diabetes Sister   . Lupus Sister   . Hypertension Brother   . Hyperlipidemia Sister   . Hypertension Sister    Social History   Social History  . Marital status: Widowed    Spouse name: N/A  . Number of children: 3  . Years of education: N/A   Occupational History  . disability     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   Social  History Main Topics  . Smoking status: Former Smoker    Packs/day: 0.50    Years: 20.00    Types: Cigarettes    Quit date: 07/21/2008  . Smokeless tobacco: Never Used  . Alcohol use No  . Drug use: No  . Sexual activity: Not Asked   Other Topics Concern  . None   Social History Narrative   Marital status: widowed      Children: 3 children      Employment: disability      Lives: alone in [place]      Tobacco: quit in 2011      Alcohol: none      Exercise: minimal   Past Surgical History:  Procedure Laterality Date  . ABDOMINAL HYSTERECTOMY    . CARDIAC CATHETERIZATION  1990's   [name]  . COLON SURGERY  05/11/11   [facility] Lysis of adhesions Crohn's  . SPINE SURGERY     Past Medical History:  Diagnosis Date  . Crohn's disease ([place])   . Disorder of sacroiliac joint   . Hyperlipidemia   . Hypertension   . Lumbago   . Lumbar post-laminectomy syndrome   . Lumbosacral neuritis   . Lumbosacral radiculitis   . Sciatica    There were no vitals taken for this visit.  Opioid Risk Score:   Fall Risk Score:  `1  Depression screen PHQ 2/9  Depression screen [facility] 2/9 05/10/2016 03/09/2016 01/19/2016 01/15/2016 12/21/2015 11/19/2015 08/20/2015  Decreased Interest 0 0 - 0 0 0 0  Down, Depressed, Hopeless 0 0 - 0 0 0 0  PHQ - 2 Score 0 0 - 0 0 0 0  Altered sleeping - - 0 - - - 1  Tired, decreased energy - - 0 - - - 0  Change in appetite - - - - - - 1  Feeling bad or failure about yourself  - - - - - - 0  Trouble concentrating - - - - - - 0  Moving slowly or fidgety/restless - - - - - - 0  Suicidal thoughts - - - - - - 0  PHQ-9 Score - - - - - - 2  Difficult doing work/chores - - - - - - Not difficult at all     Review of Systems  Constitutional: Negative.   HENT: Negative.   Eyes: Negative.   Respiratory: Negative.   Cardiovascular: Negative.   Gastrointestinal: Negative.   Endocrine: Negative.   Genitourinary: Negative.   Musculoskeletal: Positive for back pain, gait  problem and neck pain.  Skin: Negative.   Allergic/Immunologic: Negative.   Hematological: Negative.   Psychiatric/Behavioral: Negative.   All other systems reviewed and are negative.      Objective:   Physical Exam  Constitutional: She is oriented to person, place, and time. She appears well-developed and well-nourished.  HENT:  Head: Normocephalic and atraumatic.  Neck: Normal range of motion. Neck supple.  Cardiovascular: Normal rate and regular rhythm.   Pulmonary/Chest: Effort normal and breath sounds normal.  Musculoskeletal:  Normal Muscle Bulk and Muscle Testing Reveals: Upper Extremities: Full ROM and Muscle Strength 5/5 Lumbar Hypersensitivity Wearing Back Brace Lower Extremities: Full ROM and Muscle Strength 5/5 Arises from Table Slowly using walker for support Antalgic Gait    Neurological: She is alert and oriented to person, place, and time.  Skin: Skin is warm and dry.  Psychiatric: She has a normal mood and affect.  Nursing note and vitals reviewed.         Assessment & Plan:  1.Low back pain with radiating symptoms in L3-4 distribution: continues to be limited by pain. Refilled: Xtampza 18 mg ER #60 and Oxycodone 10 mg every 6 hours as needed #120. 08/26/2016 . We will continue the opioid monitoring program, this consists of regular clinic visits, examinations, urine drug screen, pill counts as well as use of [place] Controlled Substance Reporting System. Encouraged to Continue exercise regime.  2. Lumbar Radiculopathy: Continue Gabapentin. 08/26/2016 3. Chronic Right Knee Pain:No complaints today. Continue with Ice Therapy, Voltaren Gel. 08/26/2016 4. Left Greater Trochanteric Bursitis: No complaints today.Continue to Monitor. 08/26/2016  20 minutes of face to face patient care time was spent during this visit. All questions were encouraged and answered.  F/U in 1 month

## 2016-09-05 ENCOUNTER — Encounter: Payer: Medicare Other | Admitting: Registered Nurse

## 2016-09-05 ENCOUNTER — Other Ambulatory Visit: Payer: Self-pay | Admitting: Registered Nurse

## 2016-09-21 ENCOUNTER — Encounter: Payer: Self-pay | Admitting: Family Medicine

## 2016-09-21 ENCOUNTER — Ambulatory Visit (INDEPENDENT_AMBULATORY_CARE_PROVIDER_SITE_OTHER): Payer: Medicare Other | Admitting: Family Medicine

## 2016-09-21 VITALS — BP 135/79 | HR 81 | Temp 98.7°F | Resp 16 | Ht 65.75 in | Wt 230.0 lb

## 2016-09-21 DIAGNOSIS — Z6837 Body mass index (BMI) 37.0-37.9, adult: Secondary | ICD-10-CM | POA: Diagnosis not present

## 2016-09-21 DIAGNOSIS — M961 Postlaminectomy syndrome, not elsewhere classified: Secondary | ICD-10-CM

## 2016-09-21 DIAGNOSIS — K50018 Crohn's disease of small intestine with other complication: Secondary | ICD-10-CM | POA: Diagnosis not present

## 2016-09-21 DIAGNOSIS — I10 Essential (primary) hypertension: Secondary | ICD-10-CM

## 2016-09-21 DIAGNOSIS — E7800 Pure hypercholesterolemia, unspecified: Secondary | ICD-10-CM | POA: Diagnosis not present

## 2016-09-21 DIAGNOSIS — Z79890 Hormone replacement therapy (postmenopausal): Secondary | ICD-10-CM | POA: Diagnosis not present

## 2016-09-21 DIAGNOSIS — Z0000 Encounter for general adult medical examination without abnormal findings: Secondary | ICD-10-CM | POA: Diagnosis not present

## 2016-09-21 DIAGNOSIS — R110 Nausea: Secondary | ICD-10-CM

## 2016-09-21 LAB — POCT URINALYSIS DIP (MANUAL ENTRY)
BILIRUBIN UA: NEGATIVE
BILIRUBIN UA: NEGATIVE
Glucose, UA: NEGATIVE
Leukocytes, UA: NEGATIVE
Nitrite, UA: NEGATIVE
PROTEIN UA: NEGATIVE
RBC UA: NEGATIVE
Spec Grav, UA: 1.015 (ref 1.030–1.035)
Urobilinogen, UA: 0.2 (ref ?–2.0)
pH, UA: 5.5 (ref 5.0–8.0)

## 2016-09-21 MED ORDER — OMEPRAZOLE 20 MG PO CPDR: 20.0000 mg | DELAYED_RELEASE_CAPSULE | Freq: Every day | ORAL | 1 refills | Status: DC

## 2016-09-21 MED ORDER — CONJ ESTROG-MEDROXYPROGEST ACE 0.3-1.5 MG PO TABS: 1.0000 | ORAL_TABLET | ORAL | 1 refills | Status: DC

## 2016-09-21 MED ORDER — POTASSIUM CHLORIDE CRYS ER 20 MEQ PO TBCR: 20.0000 meq | EXTENDED_RELEASE_TABLET | Freq: Every day | ORAL | 3 refills | Status: DC

## 2016-09-21 MED ORDER — HYDROCHLOROTHIAZIDE 25 MG PO TABS: 25.0000 mg | ORAL_TABLET | Freq: Every day | ORAL | 3 refills | Status: DC

## 2016-09-21 NOTE — Patient Instructions (Addendum)
We recommend that you schedule a mammogram for breast cancer screening. Typically, you do not need a referral to do this. Please contact a local imaging center to schedule your mammogram.  [facility] - [phone]  *ask for the [facility] [place] ([place]) - [phone] or [phone]  [facility] - [phone] [place] [phone] [facility] - [phone]  *ask for the [name] - [phone]  *ask for the Radiology Department [facility] - [phone]  *ask for the [place] - [phone]   Preventive Care 40-64 Years, Female Preventive care refers to lifestyle choices and visits with your health care provider that can promote health and wellness. What does preventive care include?  A yearly physical exam. This is also called an annual well check.  Dental exams once or twice a year.  Routine eye exams. Ask your health care provider how often you should have your eyes checked.  Personal lifestyle choices, including:  Daily care of your teeth and gums.  Regular physical activity.  Eating a healthy diet.  Avoiding tobacco and drug use.  Limiting alcohol use.  Practicing safe sex.  Taking low-dose aspirin daily starting at age 78.  Taking vitamin and mineral supplements as recommended by your health care provider. What happens during an annual well check? The services and screenings done by your health care provider during your annual well check will depend on your age, overall health, lifestyle risk factors, and family history of disease. Counseling  Your health care provider may ask you questions about your:  Alcohol use.  Tobacco use.  Drug use.  Emotional well-being.  Home and relationship well-being.  Sexual activity.  Eating habits.  Work and work Statistician.  Method of  birth control.  Menstrual cycle.  Pregnancy history. Screening  You may have the following tests or measurements:  Height, weight, and BMI.  Blood pressure.  Lipid and cholesterol levels. These may be checked every 5 years, or more frequently if you are over 68 years old.  Skin check.  Lung cancer screening. You may have this screening every year starting at age 49 if you have a 30-pack-year history of smoking and currently smoke or have quit within the past 15 years.  Fecal occult blood test (FOBT) of the stool. You may have this test every year starting at age 55.  Flexible sigmoidoscopy or colonoscopy. You may have a sigmoidoscopy every 5 years or a colonoscopy every 10 years starting at age 76.  Hepatitis C blood test.  Hepatitis B blood test.  Sexually transmitted disease (STD) testing.  Diabetes screening. This is done by checking your blood sugar (glucose) after you have not eaten for a while (fasting). You may have this done every 1-3 years.  Mammogram. This may be done every 1-2 years. Talk to your health care provider about when you should start having regular mammograms. This may depend on whether you have a family history of breast cancer.  BRCA-related cancer screening. This may be done if you have a family history of breast, ovarian, tubal, or peritoneal cancers.  Pelvic exam and Pap test. This may be done every 3 years starting at age 19. Starting at age 32, this may be done every 5 years if you have a Pap test in combination with an HPV test.  Bone density scan. This is done to screen for osteoporosis. You may have  this scan if you are at high risk for osteoporosis. Discuss your test results, treatment options, and if necessary, the need for more tests with your health care provider. Vaccines  Your health care provider may recommend certain vaccines, such as:  Influenza vaccine. This is recommended every year.  Tetanus, diphtheria, and acellular pertussis  (Tdap, Td) vaccine. You may need a Td booster every 10 years.  Varicella vaccine. You may need this if you have not been vaccinated.  Zoster vaccine. You may need this after age 35.  Measles, mumps, and rubella (MMR) vaccine. You may need at least one dose of MMR if you were born in 1957 or later. You may also need a second dose.  Pneumococcal 13-valent conjugate (PCV13) vaccine. You may need this if you have certain conditions and were not previously vaccinated.  Pneumococcal polysaccharide (PPSV23) vaccine. You may need one or two doses if you smoke cigarettes or if you have certain conditions.  Meningococcal vaccine. You may need this if you have certain conditions.  Hepatitis A vaccine. You may need this if you have certain conditions or if you travel or work in places where you may be exposed to hepatitis A.  Hepatitis B vaccine. You may need this if you have certain conditions or if you travel or work in places where you may be exposed to hepatitis B.  Haemophilus influenzae type b (Hib) vaccine. You may need this if you have certain conditions. Talk to your health care provider about which screenings and vaccines you need and how often you need them. This information is not intended to replace advice given to you by your health care provider. Make sure you discuss any questions you have with your health care provider. Document Released: 07/03/2015 Document Revised: 02/24/2016 Document Reviewed: 04/07/2015 Elsevier Interactive Patient Education  2017 [employer].    IF you received an x-ray today, you will receive an invoice from [facility]. Please contact [facility] at [phone] with questions or concerns regarding your invoice.   IF you received labwork today, you will receive an invoice from [facility]. Please contact [facility] at [phone] with questions or concerns regarding your invoice.   Our billing staff will not be able to assist you with  questions regarding bills from these companies.  You will be contacted with the lab results as soon as they are available. The fastest way to get your results is to activate your My Chart account. Instructions are located on the last page of this paperwork. If you have not heard from [place] regarding the results in 2 weeks, please contact this office.

## 2016-09-21 NOTE — Progress Notes (Signed)
Subjective:    Patient ID: [name] [name], female    DOB: Jul 25, 1955, 61 y.o.   MRN: [id number]  09/21/2016  Annual Exam and Medication Refill [name] [name])   HPI This 61 y.o. female presents for Annual Wellness Examination and Complete Physical Examination and follow-up of chronic medical conditions.  Last physical: 2016 Pap smear:  Hysterectomy; ovaries intact. History of cervical dysplasia Mammogram:  05/07/2014 Colonoscopy:  08/26/08 Eye exam: none Dental exam:  None; overdue; dentures   Visual Acuity Screening   Right eye Left eye Both eyes  Without correction:     With correction: 20/70 20/30 20/30     Immunization History  Administered Date(s) Administered  . Influenza Split 04/23/2012  . Influenza,inj,Quad PF,36+ Mos 03/09/2016  . Tdap 05/10/2016   BP Readings from Last 3 Encounters:  09/27/16 134/82  09/21/16 135/79  08/26/16 131/70   Wt Readings from Last 3 Encounters:  09/21/16 230 lb (104.3 kg)  05/18/16 224 lb (101.6 kg)  05/10/16 225 lb (102.1 kg)   HTN: Patient reports good compliance with medication, good tolerance to medication, and good symptom control.  Not checking BP at home.  Hypercholesterolemia: Patient reports good compliance with medication, good tolerance to medication, and good symptom control.    DDD lumbar: s/p back surgery; staying with daughter; maintained on pain medication.  Nausea: daily; every evening at 6:00pm; before eating every evening.  Has Phenergan PRN.     Review of Systems  Constitutional: Negative for activity change, appetite change, chills, diaphoresis, fatigue, fever and unexpected weight change.  HENT: Negative for congestion, dental problem, drooling, ear discharge, ear pain, facial swelling, hearing loss, mouth sores, nosebleeds, postnasal drip, rhinorrhea, sinus pressure, sneezing, sore throat, tinnitus, trouble swallowing and voice change.   Eyes: Negative for photophobia, pain, discharge, redness, itching and  visual disturbance.  Respiratory: Negative for apnea, cough, choking, chest tightness, shortness of breath, wheezing and stridor.   Cardiovascular: Negative for chest pain, palpitations and leg swelling.  Gastrointestinal: Positive for nausea. Negative for abdominal distention, abdominal pain, anal bleeding, blood in stool, constipation, diarrhea, rectal pain and vomiting.  Endocrine: Negative for cold intolerance, heat intolerance, polydipsia, polyphagia and polyuria.  Genitourinary: Negative for decreased urine volume, difficulty urinating, dyspareunia, dysuria, enuresis, flank pain, frequency, genital sores, hematuria, menstrual problem, pelvic pain, urgency, vaginal bleeding, vaginal discharge and vaginal pain.       Nocturia x 2-5; urinary leakage urge YES.  Musculoskeletal: Positive for back pain. Negative for arthralgias, gait problem, joint swelling, myalgias, neck pain and neck stiffness.  Skin: Negative for color change, pallor, rash and wound.  Allergic/Immunologic: Negative for environmental allergies, food allergies and immunocompromised state.  Neurological: Negative for dizziness, tremors, seizures, syncope, facial asymmetry, speech difficulty, weakness, light-headedness, numbness and headaches.  Hematological: Negative for adenopathy. Does not bruise/bleed easily.  Psychiatric/Behavioral: Positive for sleep disturbance. Negative for agitation, behavioral problems, confusion, decreased concentration, dysphoric mood, hallucinations, self-injury and suicidal ideas. The patient is not nervous/anxious and is not hyperactive.        Bedtime 8:00-9:00pm; wakes up 7:00am.    Past Medical History:  Diagnosis Date  . Crohn's disease ([place])   . Disorder of sacroiliac joint   . Hyperlipidemia   . Hypertension   . Lumbago   . Lumbar post-laminectomy syndrome   . Lumbosacral neuritis   . Lumbosacral radiculitis   . Sciatica    Past Surgical History:  Procedure Laterality Date  .  ABDOMINAL HYSTERECTOMY     DUB; ovaries intact  .  CARDIAC CATHETERIZATION  1990's   [name]  . COLON SURGERY  05/11/11   [facility] L[facility] of adhesions Crohn's  . SPINE SURGERY     Allergies  Allergen Reactions  . Orudis [Ketoprofen] Hives, Itching and Rash    onSet: 07/30/1990    Social History   Social History  . Marital status: Widowed    Spouse name: N/A  . Number of children: 3  . Years of education: N/A   Occupational History  . 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 Smoking status: Former Smoker    Packs/day: 0.50    Years: 20.00    Types: Cigarettes    Quit date: 07/21/2008  . Smokeless tobacco: Never Used  . Alcohol use No  . Drug use: No  . Sexual activity: Not on file   Other Topics Concern  . Not on file   Social History Narrative   Marital status: widowed since 2002; not dating which is bad in 2018      Children: 3 children (45, 35, 2); 13 grandchildren; 1 gg      Employment: disability since 2009; retail      Lives: alone in [place] in apartment      Tobacco: quit in 2011      Alcohol: none      Drugs: none      Exercise: minimal in 2018      ADLs: independent with ADLs; drives      Seatbelt: 100%; no texting.   Family History  Problem Relation Age of Onset  . Heart disease Mother 8    CHF; CAD  . Hypertension Mother   . Heart failure Mother   . Diabetes Sister   . Lupus Sister   . Kidney failure Sister   . Hypertension Brother   . Hyperlipidemia Sister   . Hypertension Sister        Objective:    BP 135/79 (BP Location: Right Arm, Patient Position: Sitting, Cuff Size: Large)   Pulse 81   Temp 98.7 F (37.1 C) (Oral)   Resp 16   Ht 5' 5.75" (1.67 m)   Wt 230 lb (104.3 kg)   SpO2 96%   BMI 37.41 kg/m  Physical Exam  Constitutional: She is oriented to person, place, and time. She appears well-developed and well-nourished. No distress.  HENT:  Head: Normocephalic and atraumatic.  Right Ear:  External ear normal.  Left Ear: External ear normal.  Nose: Nose normal.  Mouth/Throat: Oropharynx is clear and moist.  Eyes: Conjunctivae and EOM are normal. Pupils are equal, round, and reactive to light.  Neck: Normal range of motion and full passive range of motion without pain. Neck supple. No JVD present. Carotid bruit is not present. No thyromegaly present.  Cardiovascular: Normal rate, regular rhythm and normal heart sounds.  Exam reveals no gallop and no friction rub.   No murmur heard. Pulmonary/Chest: Effort normal and breath sounds normal. She has no wheezes. She has no rales.  Abdominal: Soft. Bowel sounds are normal. She exhibits no distension and no mass. There is no tenderness. There is no rebound and no guarding.  Musculoskeletal:       Right shoulder: Normal.       Left shoulder: Normal.       Cervical back: Normal.  Lymphadenopathy:    She has no cervical adenopathy.  Neurological: She is alert and oriented to person, place, and time. She has normal  reflexes. No cranial nerve deficit. She exhibits normal muscle tone. Coordination normal.  Skin: Skin is warm and dry. No rash noted. She is not diaphoretic. No erythema. No pallor.  Psychiatric: She has a normal mood and affect. Her behavior is normal. Judgment and thought content normal.  Nursing note and vitals reviewed.   Depression screen [facility] 2/9 09/27/2016 09/21/2016 05/10/2016 03/09/2016 01/19/2016  Decreased Interest 0 0 0 0 -  Down, Depressed, Hopeless 0 0 0 0 -  PHQ - 2 Score 0 0 0 0 -  Altered sleeping - - - - 0  Tired, decreased energy - - - - 0  Change in appetite - - - - -  Feeling bad or failure about yourself  - - - - -  Trouble concentrating - - - - -  Moving slowly or fidgety/restless - - - - -  Suicidal thoughts - - - - -  PHQ-9 Score - - - - -  Difficult doing work/chores - - - - -   Fall Risk  09/27/2016 09/21/2016 08/26/2016 08/11/2016 07/12/2016  Falls in the past year? No No No No No  Risk for fall due  to : - - - - -   Functional Status Survey: Is the patient deaf or have difficulty hearing?: No Does the patient have difficulty seeing, even when wearing glasses/contacts?: No Does the patient have difficulty concentrating, remembering, or making decisions?: No Does the patient have difficulty walking or climbing stairs?: Yes Does the patient have difficulty dressing or bathing?: Yes Does the patient have difficulty doing errands alone such as visiting a doctor's office or shopping?: Yes     Assessment & Plan:   1. Encounter for Medicare annual wellness exam   2. Routine physical examination   3. Essential hypertension   4. Crohn's disease of small intestine with other complication ([place])   5. Pure hypercholesterolemia   6. On postmenopausal hormone replacement therapy   7. Nausea without vomiting   8. Postlaminectomy syndrome, lumbar region   9. Postlaminectomy syndrome, cervical region   10. Class 2 severe obesity due to excess calories with serious comorbidity and body mass index (BMI) of 37.0 to 37.9 in adult [facility])    -anticipatory guidance -- weight loss, exercise, 3 servings of calcium daily, ASA therapy. -no hearing loss; no evidence of depression; moderate fall risk due to chronic lower back pain and daily narcotic use.   -obtain labs for chronic disease management; refills provided. -suffering with daily nausea; ddx includes gastritis versus opiate induced nausea.  Treat with Prilosec. -wean Prempro over the upcoming six months due to risk of CVA or AMI with longterm HRT. -doing well in post-operative period.  Ambulating very slowl yet doing well.  Orders Placed This Encounter  Procedures  . CBC with Differential/Platelet  . Comprehensive metabolic panel    Order Specific Question:   Has the patient fasted?    Answer:   Yes  . Lipid panel    Order Specific Question:   Has the patient fasted?    Answer:   Yes  . POCT urinalysis dipstick   Meds ordered this encounter    Medications  . hydrochlorothiazide (HYDRODIURIL) 25 MG tablet    Sig: Take 1 tablet (25 mg total) by mouth daily.    Dispense:  90 tablet    Refill:  3  . potassium chloride SA (K-DUR,KLOR-CON) 20 MEQ tablet    Sig: Take 1 tablet (20 mEq total) by mouth daily.  Dispense:  90 tablet    Refill:  3  . estrogen, conjugated,-medroxyprogesterone (PREMPRO) 0.3-1.5 MG tablet    Sig: Take 1 tablet by mouth every other day.    Dispense:  45 tablet    Refill:  1  . omeprazole (PRILOSEC) 20 MG capsule    Sig: Take 1 capsule (20 mg total) by mouth daily.    Dispense:  90 capsule    Refill:  1    Return in about 6 months (around 03/23/2017) for recheck blood pressure, high cholesterol.   Tehran Rabenold Elayne Guerin, M.D. Primary Care at [facility] previously [facility] [address] [place], [place]  [address] [phone] phone [phone] fax

## 2016-09-22 ENCOUNTER — Telehealth: Payer: Self-pay | Admitting: Family Medicine

## 2016-09-22 LAB — CBC WITH DIFFERENTIAL/PLATELET
BASOS ABS: 0 10*3/uL (ref 0.0–0.2)
Basos: 0 %
EOS (ABSOLUTE): 0.1 10*3/uL (ref 0.0–0.4)
Eos: 2 %
HEMOGLOBIN: 11.7 g/dL (ref 11.1–15.9)
Hematocrit: 37.6 % (ref 34.0–46.6)
Immature Grans (Abs): 0 10*3/uL (ref 0.0–0.1)
Immature Granulocytes: 0 %
LYMPHS: 45 %
Lymphocytes Absolute: 2.1 10*3/uL (ref 0.7–3.1)
MCH: 26.1 pg — AB (ref 26.6–33.0)
MCHC: 31.1 g/dL — ABNORMAL LOW (ref 31.5–35.7)
MCV: 84 fL (ref 79–97)
MONOCYTES: 7 %
Monocytes Absolute: 0.3 10*3/uL (ref 0.1–0.9)
NEUTROS ABS: 2.1 10*3/uL (ref 1.4–7.0)
NEUTROS PCT: 46 %
PLATELETS: 250 10*3/uL (ref 150–379)
RBC: 4.48 x10E6/uL (ref 3.77–5.28)
RDW: 14.3 % (ref 12.3–15.4)
WBC: 4.7 10*3/uL (ref 3.4–10.8)

## 2016-09-22 LAB — LIPID PANEL
CHOL/HDL RATIO: 3.4 ratio (ref 0.0–4.4)
Cholesterol, Total: 162 mg/dL (ref 100–199)
HDL: 47 mg/dL (ref >39)
LDL CALC: 88 mg/dL (ref 0–99)
TRIGLYCERIDES: 136 mg/dL (ref 0–149)
VLDL CHOLESTEROL CAL: 27 mg/dL (ref 5–40)

## 2016-09-22 LAB — COMPREHENSIVE METABOLIC PANEL
ALBUMIN: 4.6 g/dL (ref 3.6–4.8)
ALT: 24 IU/L (ref 0–32)
AST: 29 IU/L (ref 0–40)
Albumin/Globulin Ratio: 1.5 (ref 1.2–2.2)
Alkaline Phosphatase: 96 IU/L (ref 39–117)
BUN/Creatinine Ratio: 7 — ABNORMAL LOW (ref 12–28)
BUN: 8 mg/dL (ref 8–27)
Bilirubin Total: 0.4 mg/dL (ref 0.0–1.2)
CALCIUM: 9.3 mg/dL (ref 8.7–10.3)
CO2: 25 mmol/L (ref 18–29)
Chloride: 99 mmol/L (ref 96–106)
Creatinine, Ser: 1.13 mg/dL — ABNORMAL HIGH (ref 0.57–1.00)
GFR, EST AFRICAN AMERICAN: 61 mL/min/{1.73_m2} (ref >59)
GFR, EST NON AFRICAN AMERICAN: 53 mL/min/{1.73_m2} — AB (ref >59)
GLOBULIN, TOTAL: 3 g/dL (ref 1.5–4.5)
Glucose: 108 mg/dL — ABNORMAL HIGH (ref 65–99)
Potassium: 4.4 mmol/L (ref 3.5–5.2)
SODIUM: 139 mmol/L (ref 134–144)
TOTAL PROTEIN: 7.6 g/dL (ref 6.0–8.5)

## 2016-09-22 MED ORDER — FLUTICASONE PROPIONATE 50 MCG/ACT NA SUSP: 2.0000 | Freq: Every day | NASAL | 2 refills | Status: DC

## 2016-09-22 NOTE — Telephone Encounter (Signed)
Pt needs a new nasal spray FLUTICASONE TROPIONATE USP50 NCU   Please advise

## 2016-09-25 DIAGNOSIS — M960 Pseudarthrosis after fusion or arthrodesis: Secondary | ICD-10-CM | POA: Diagnosis not present

## 2016-09-25 DIAGNOSIS — I10 Essential (primary) hypertension: Secondary | ICD-10-CM | POA: Diagnosis not present

## 2016-09-27 ENCOUNTER — Encounter: Payer: Medicare Other | Attending: Physical Medicine & Rehabilitation | Admitting: Registered Nurse

## 2016-09-27 ENCOUNTER — Encounter: Payer: Self-pay | Admitting: Registered Nurse

## 2016-09-27 VITALS — BP 134/82 | HR 82 | Resp 14

## 2016-09-27 DIAGNOSIS — M961 Postlaminectomy syndrome, not elsewhere classified: Secondary | ICD-10-CM | POA: Diagnosis not present

## 2016-09-27 DIAGNOSIS — Z79899 Other long term (current) drug therapy: Secondary | ICD-10-CM | POA: Diagnosis not present

## 2016-09-27 DIAGNOSIS — M48061 Spinal stenosis, lumbar region without neurogenic claudication: Secondary | ICD-10-CM

## 2016-09-27 DIAGNOSIS — M5417 Radiculopathy, lumbosacral region: Secondary | ICD-10-CM | POA: Insufficient documentation

## 2016-09-27 DIAGNOSIS — M5414 Radiculopathy, thoracic region: Secondary | ICD-10-CM | POA: Insufficient documentation

## 2016-09-27 DIAGNOSIS — G894 Chronic pain syndrome: Secondary | ICD-10-CM | POA: Diagnosis not present

## 2016-09-27 DIAGNOSIS — Z5181 Encounter for therapeutic drug level monitoring: Secondary | ICD-10-CM | POA: Diagnosis not present

## 2016-09-27 DIAGNOSIS — G8918 Other acute postprocedural pain: Secondary | ICD-10-CM

## 2016-09-27 MED ORDER — OXYCODONE ER 18 MG PO C12A: 18.0000 mg | EXTENDED_RELEASE_CAPSULE | Freq: Two times a day (BID) | ORAL | 0 refills | Status: DC

## 2016-09-27 MED ORDER — OXYCODONE HCL 10 MG PO TABS: 10.0000 mg | ORAL_TABLET | Freq: Four times a day (QID) | ORAL | 0 refills | Status: DC | PRN

## 2016-09-27 NOTE — Progress Notes (Signed)
Subjective:    Patient ID: [name] [name], female    DOB: 1955/12/19, 60 y.o.   MRN: [id number]  HPI: [name] is a 61 year old female who returns for follow up appointmentfor chronic pain and medication refill.She states her pain is located in her lower back .She rates her pain 6. Her current exercise regime is walking with walker and performing stretching exercises in her bed she states.  Her last UDS was 08/11/2016 it was consistent.  She underwent uncomplicated  Bilateral re-exploration Lumbar Decompression L-2-3, L3-L4, L-4- 5L5-S1 metal removal (expedium) L2-3, L5-S1 Fusion exploration L4-5, L5-S1 Repair Non-Union with Re instrumentation Left iliac crest bone graftwith Dr. Velna Ochs on 07/22/2016.  Pain Inventory Average Pain 6 Pain Right Now 6 My pain is constant, dull and aching  In the last 24 hours, has pain interfered with the following? General activity 5 Relation with others 5 Enjoyment of life 5 What TIME of day is your pain at its worst? morning evening and night Sleep (in general) Fair  Pain is worse with: walking, bending and standing Pain improves with: rest, heat/ice and medication Relief from Meds: 5  Mobility use a walker how many minutes can you walk? 10 ability to climb steps?  yes do you drive?  yes  Function disabled: date disabled 5/09 I need assistance with the following:  meal prep, household duties and shopping  Neuro/Psych trouble walking spasms  Prior Studies Any changes since last visit?  no  Physicians involved in your care Any changes since last visit?  no   Family History  Problem Relation Age of Onset  . Heart disease Mother 28    CHF; CAD  . Hypertension Mother   . Heart failure Mother   . Diabetes Sister   . Lupus Sister   . Kidney failure Sister   . Hypertension Brother   . Hyperlipidemia Sister   . Hypertension Sister    Social History   Social History  . Marital status: Widowed    Spouse name:  N/A  . Number of children: 3  . Years of education: N/A   Occupational History  . 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 Smoking status: Former Smoker    Packs/day: 0.50    Years: 20.00    Types: Cigarettes    Quit date: 07/21/2008  . Smokeless tobacco: Never Used  . Alcohol use No  . Drug use: No  . Sexual activity: Not Asked   Other Topics Concern  . None   Social History Narrative   Marital status: widowed since 2002; not dating which is bad in 2018      Children: 3 children (45, 63, 11); 13 grandchildren; 1 gg      Employment: disability since 2009; retail      Lives: alone in [place] in apartment      Tobacco: quit in 2011      Alcohol: none      Drugs: none      Exercise: minimal in 2018      ADLs: independent with ADLs; drives      Seatbelt: 100%; no texting.   Past Surgical History:  Procedure Laterality Date  . ABDOMINAL HYSTERECTOMY     DUB; ovaries intact  . CARDIAC CATHETERIZATION  1990's   [name]  . COLON SURGERY  05/11/11   [facility] L[facility] of adhesions Crohn's  . [place]     Past Medical  History:  Diagnosis Date  . Crohn's disease ([place])   . Disorder of sacroiliac joint   . Hyperlipidemia   . Hypertension   . Lumbago   . Lumbar post-laminectomy syndrome   . Lumbosacral neuritis   . Lumbosacral radiculitis   . Sciatica    BP 134/82   Pulse 82   Resp 14   SpO2 96%   Opioid Risk Score:   Fall Risk Score:  `1  Depression screen PHQ 2/9  Depression screen [facility] 2/9 09/27/2016 09/21/2016 05/10/2016 03/09/2016 01/19/2016 01/15/2016 12/21/2015  Decreased Interest 0 0 0 0 - 0 0  Down, Depressed, Hopeless 0 0 0 0 - 0 0  PHQ - 2 Score 0 0 0 0 - 0 0  Altered sleeping - - - - 0 - -  Tired, decreased energy - - - - 0 - -  Change in appetite - - - - - - -  Feeling bad or failure about yourself  - - - - - - -  Trouble concentrating - - - - - - -  Moving slowly or fidgety/restless - - - - - - -  Suicidal thoughts - - - -  - - -  PHQ-9 Score - - - - - - -  Difficult doing work/chores - - - - - - -     Review of Systems  Constitutional: Positive for diaphoresis and unexpected weight change.  HENT: Negative.   Eyes: Negative.   Respiratory: Negative.   Cardiovascular: Negative.   Gastrointestinal: Positive for nausea.  Endocrine: Negative.   Genitourinary: Negative.   Musculoskeletal:       Spasms  Skin: Negative.   Allergic/Immunologic: Negative.   Neurological: Negative.   Hematological: Negative.   Psychiatric/Behavioral: Negative.   All other systems reviewed and are negative.      Objective:   Physical Exam  Constitutional: She is oriented to person, place, and time. She appears well-developed and well-nourished.  HENT:  Head: Normocephalic and atraumatic.  Neck: Normal range of motion. Neck supple.  Cardiovascular: Normal rate and regular rhythm.   Pulmonary/Chest: Effort normal and breath sounds normal.  Musculoskeletal:  Normal Muscle Bulk and Muscle Testing Reveals: Upper Extremities: Full ROM and Muscle Strength 5/5 Thoracic Paraspinal Tenderness: T-1-T-3 Mainly Left Side Lumbar Paraspinal Tenderness: L-4-L-5 Wearing Back Brace Sacral Tenderness Noted Lower Extremities: Full ROM and Muscle Strength 5/5\Arises from Table Slowly using walker for support Antalgic Gait  Neurological: She is alert and oriented to person, place, and time.  Skin: Skin is warm and dry.  Psychiatric: She has a normal mood and affect.  Nursing note and vitals reviewed.         Assessment & Plan:  1.Lumbar Postlaminectomy/ Spinal Stenosis Lumbar Region/ Post-Op Pain/Low back pain with radiating symptoms in L3-4 distribution:  continues to be limited by pain. Refilled: Xtampza 18 mg ER one tablet every 12 hours #60 and Oxycodone 10 mg every 6 hours as needed #120. 09/27/2016 . We will continue the opioid monitoring program, this consists of regular clinic visits, examinations, urine drug screen, pill  counts as well as use of [place] Controlled Substance Reporting System. Encouraged to Continue exercise regime.  2. Lumbar Radiculopathy: No complaints today.Continue Gabapentin. 09/27/2016 3. Chronic Right Knee Pain:No complaints today. Continue with Ice Therapy, Voltaren Gel. 09/27/2016 4. Left Greater Trochanteric Bursitis: No complaints today.Continue to Monitor. 09/27/2016  20 minutes of face to face patient care time was spent during this visit. All questions were encouraged and  answered.   F/U in 1 month

## 2016-10-06 DIAGNOSIS — Z981 Arthrodesis status: Secondary | ICD-10-CM | POA: Diagnosis not present

## 2016-10-12 ENCOUNTER — Telehealth: Payer: Self-pay | Admitting: Registered Nurse

## 2016-10-12 NOTE — Telephone Encounter (Signed)
Placed a call to Ms. Asch, no answer. Voicemail full, for clarification.

## 2016-10-25 ENCOUNTER — Encounter: Payer: Medicare Other | Attending: Physical Medicine & Rehabilitation | Admitting: Registered Nurse

## 2016-10-25 ENCOUNTER — Encounter: Payer: Self-pay | Admitting: Registered Nurse

## 2016-10-25 VITALS — BP 134/74 | HR 72

## 2016-10-25 DIAGNOSIS — Z79899 Other long term (current) drug therapy: Secondary | ICD-10-CM | POA: Diagnosis not present

## 2016-10-25 DIAGNOSIS — M48061 Spinal stenosis, lumbar region without neurogenic claudication: Secondary | ICD-10-CM

## 2016-10-25 DIAGNOSIS — M5417 Radiculopathy, lumbosacral region: Secondary | ICD-10-CM | POA: Diagnosis not present

## 2016-10-25 DIAGNOSIS — M960 Pseudarthrosis after fusion or arthrodesis: Secondary | ICD-10-CM | POA: Diagnosis not present

## 2016-10-25 DIAGNOSIS — G894 Chronic pain syndrome: Secondary | ICD-10-CM

## 2016-10-25 DIAGNOSIS — M5414 Radiculopathy, thoracic region: Secondary | ICD-10-CM | POA: Insufficient documentation

## 2016-10-25 DIAGNOSIS — Z5181 Encounter for therapeutic drug level monitoring: Secondary | ICD-10-CM | POA: Diagnosis not present

## 2016-10-25 DIAGNOSIS — I10 Essential (primary) hypertension: Secondary | ICD-10-CM | POA: Diagnosis not present

## 2016-10-25 DIAGNOSIS — M961 Postlaminectomy syndrome, not elsewhere classified: Secondary | ICD-10-CM

## 2016-10-25 MED ORDER — OXYCODONE HCL 10 MG PO TABS: 10.0000 mg | ORAL_TABLET | Freq: Four times a day (QID) | ORAL | 0 refills | Status: DC | PRN

## 2016-10-25 MED ORDER — OXYCODONE ER 18 MG PO C12A: 18.0000 mg | EXTENDED_RELEASE_CAPSULE | Freq: Two times a day (BID) | ORAL | 0 refills | Status: DC

## 2016-10-25 NOTE — Progress Notes (Signed)
Subjective:    Patient ID: [name] [name], female    DOB: [birth year], 61 y.o.   MRN: [id number]  HPI: [name] is a 61 year old female who returns for follow up appointmentfor chronic pain and medication refill.She states her pain is located in her lower back .She rates her pain 5. Her current exercise regime is walking with walker.  Her last UDS was 08/11/2016 it was consistent.  She underwent uncomplicated Bilateral re-exploration Lumbar Decompression L-2-3, L3-L4, L-4- 5L5-S1 metal removal (expedium) L2-3, L5-S1 Fusion exploration L4-5, L5-S1 Repair Non-Union with Re instrumentation Left iliac crest bone graftwith Dr. Velna Ochs on 07/22/2016.  Pain Inventory Average Pain 5 Pain Right Now 5 My pain is intermittent and aching  In the last 24 hours, has pain interfered with the following? General activity 5 Relation with others 5 Enjoyment of life 5 What TIME of day is your pain at its worst? morning evening and night Sleep (in general) Fair  Pain is worse with: bending and standing Pain improves with: rest, heat/ice and medication Relief from Meds: 4  Mobility use a cane use a walker how many minutes can you walk? 5 ability to climb steps?  yes do you drive?  yes  Function disabled: date disabled 5/09 I need assistance with the following:  meal prep, household duties and shopping  Neuro/Psych spasms  Prior Studies Any changes since last visit?  no  Physicians involved in your care Any changes since last visit?  no   Family History  Problem Relation Age of Onset  . Heart disease Mother 35    CHF; CAD  . Hypertension Mother   . Heart failure Mother   . Diabetes Sister   . Lupus Sister   . Kidney failure Sister   . Hypertension Brother   . Hyperlipidemia Sister   . Hypertension Sister    Social History   Social History  . Marital status: Widowed    Spouse name: N/A  . Number of children: 3  . Years of education: N/A   Occupational  History  . 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 Smoking status: Former Smoker    Packs/day: 0.50    Years: 20.00    Types: Cigarettes    Quit date: 07/21/2008  . Smokeless tobacco: Never Used  . Alcohol use No  . Drug use: No  . Sexual activity: Not Asked   Other Topics Concern  . None   Social History Narrative   Marital status: widowed since 2002; not dating which is bad in 2018      Children: 3 children (45, 74, 29); 13 grandchildren; 1 gg      Employment: disability since 2009; retail      Lives: alone in [place] in apartment      Tobacco: quit in 2011      Alcohol: none      Drugs: none      Exercise: minimal in 2018      ADLs: independent with ADLs; drives      Seatbelt: 100%; no texting.   Past Surgical History:  Procedure Laterality Date  . ABDOMINAL HYSTERECTOMY     DUB; ovaries intact  . CARDIAC CATHETERIZATION  1990's   [name]  . COLON SURGERY  05/11/11   [facility] of adhesions Crohn's  . SPINE SURGERY     Past Medical History:  Diagnosis Date  . Crohn's disease ([place])   .  Disorder of sacroiliac joint   . Hyperlipidemia   . Hypertension   . Lumbago   . Lumbar post-laminectomy syndrome   . Lumbosacral neuritis   . Lumbosacral radiculitis   . Sciatica    BP 134/74   Pulse 72   SpO2 94%   Opioid Risk Score:   Fall Risk Score:  `1  Depression screen PHQ 2/9  Depression screen [facility] 2/9 10/25/2016 09/27/2016 09/21/2016 05/10/2016 03/09/2016 01/19/2016 01/15/2016  Decreased Interest 0 0 0 0 0 - 0  Down, Depressed, Hopeless 0 0 0 0 0 - 0  PHQ - 2 Score 0 0 0 0 0 - 0  Altered sleeping - - - - - 0 -  Tired, decreased energy - - - - - 0 -  Change in appetite - - - - - - -  Feeling bad or failure about yourself  - - - - - - -  Trouble concentrating - - - - - - -  Moving slowly or fidgety/restless - - - - - - -  Suicidal thoughts - - - - - - -  PHQ-9 Score - - - - - - -  Difficult doing work/chores - - - - - - -     Review of Systems  Constitutional: Negative.   HENT: Negative.   Eyes: Negative.   Respiratory: Negative.   Cardiovascular: Negative.   Gastrointestinal: Negative.   Endocrine: Negative.   Genitourinary: Negative.   Musculoskeletal:       Spasms  Skin: Negative.   Allergic/Immunologic: Negative.   Neurological: Negative.   Hematological: Negative.   Psychiatric/Behavioral: Negative.   All other systems reviewed and are negative.      Objective:   Physical Exam  Constitutional: She is oriented to person, place, and time. She appears well-developed and well-nourished.  HENT:  Head: Normocephalic and atraumatic.  Neck: Normal range of motion. Neck supple.  Cardiovascular: Normal rate and regular rhythm.   Pulmonary/Chest: Effort normal and breath sounds normal.  Musculoskeletal:  Normal Muscle Bulk and Muscle Testing Reveals: Upper Extremities: Full ROM and Muscle Strength 5/5 Lumbar Paraspinal Tenderness: L-3-L-5 Lower Extremities: Full ROM and Muscle Strength 5/5 Arises from Table Slowly using Walker for Support Narrow Based Gait  Neurological: She is alert and oriented to person, place, and time.  Skin: Skin is warm and dry.  Psychiatric: She has a normal mood and affect.  Nursing note and vitals reviewed.         Assessment & Plan:  1.Lumbar Postlaminectomy/ Spinal Stenosis Lumbar Region/ Post-Op Pain/Low back pain with radiating symptoms in L3-4 distribution:  continues to be limited by pain. Refilled: Xtampza 18 mg ER one tablet every 12 hours #60 and Oxycodone 10 mg every 6 hours as needed #120. 10/25/2016 . We will continue the opioid monitoring program, this consists of regular clinic visits, examinations, urine drug screen, pill counts as well as use of [place] Controlled Substance Reporting System. Encouraged to Continue exercise regime.  2. Lumbar Radiculopathy: No complaints today.Continue Gabapentin. 10/25/2016 3. Chronic Right Knee Pain:No  complaints today. Continue with Ice Therapy, Voltaren Gel. 10/25/2016 4. Left Greater Trochanteric Bursitis: No complaints today.Continue to Monitor. 10/25/2016  20  minutes of face to face patient care time was spent during this visit. All questions were encouraged and answered.  F/U in 1 month

## 2016-11-02 ENCOUNTER — Other Ambulatory Visit: Payer: Self-pay | Admitting: Registered Nurse

## 2016-11-23 ENCOUNTER — Encounter: Payer: Self-pay | Admitting: Registered Nurse

## 2016-11-23 ENCOUNTER — Encounter: Payer: Medicare Other | Attending: Physical Medicine & Rehabilitation | Admitting: Registered Nurse

## 2016-11-23 ENCOUNTER — Telehealth: Payer: Self-pay | Admitting: Registered Nurse

## 2016-11-23 VITALS — BP 128/84 | HR 73

## 2016-11-23 DIAGNOSIS — M5414 Radiculopathy, thoracic region: Secondary | ICD-10-CM | POA: Diagnosis not present

## 2016-11-23 DIAGNOSIS — Z79899 Other long term (current) drug therapy: Secondary | ICD-10-CM | POA: Diagnosis not present

## 2016-11-23 DIAGNOSIS — G894 Chronic pain syndrome: Secondary | ICD-10-CM

## 2016-11-23 DIAGNOSIS — M5417 Radiculopathy, lumbosacral region: Secondary | ICD-10-CM | POA: Insufficient documentation

## 2016-11-23 DIAGNOSIS — Z5181 Encounter for therapeutic drug level monitoring: Secondary | ICD-10-CM

## 2016-11-23 DIAGNOSIS — M961 Postlaminectomy syndrome, not elsewhere classified: Secondary | ICD-10-CM

## 2016-11-23 DIAGNOSIS — M48061 Spinal stenosis, lumbar region without neurogenic claudication: Secondary | ICD-10-CM | POA: Diagnosis not present

## 2016-11-23 MED ORDER — OXYCODONE ER 18 MG PO C12A: 18.0000 mg | EXTENDED_RELEASE_CAPSULE | Freq: Two times a day (BID) | ORAL | 0 refills | Status: DC

## 2016-11-23 MED ORDER — OXYCODONE HCL 10 MG PO TABS: 10.0000 mg | ORAL_TABLET | Freq: Four times a day (QID) | ORAL | 0 refills | Status: DC | PRN

## 2016-11-23 NOTE — Telephone Encounter (Signed)
On 11/23/2016 the [place] was reviewed no conflict was seen on the [place] with multiple prescribers. Ms. Polakowski has a signed narcotic contract with our office. If there were any discrepancies this would have been reported to her physician.

## 2016-11-23 NOTE — Progress Notes (Signed)
Subjective:    Patient ID: [name] [name], female    DOB: [birth year], 61 y.o.   MRN: [id number]  HPI: [name] [name] is a 61year old female who returns for follow up appointmentfor chronic pain and medication refill.She states her pain is located in her lower back .She rates her pain 5. Her current exercise regime is walking.  Her last UDS was 08/11/2016 it was consistent. UDS ordered today.  She underwent uncomplicated Bilateral re-exploration Lumbar Decompression L-2-3, L3-L4, L-4- 5L5-S1 metal removal (expedium) L2-3, L5-S1 Fusion exploration L4-5, L5-S1 Repair Non-Union with Re instrumentation Left iliac crest bone graftwith Dr. Velna Ochs on 07/22/2016.   Pain Inventory Average Pain 5 Pain Right Now 5 My pain is intermittent, stabbing and aching  In the last 24 hours, has pain interfered with the following? General activity 4 Relation with others 4 Enjoyment of life 4 What TIME of day is your pain at its worst? morning, night Sleep (in general) Fair  Pain is worse with: inactivity Pain improves with: rest, heat/ice and medication Relief from Meds: 5  Mobility walk with assistance use a cane how many minutes can you walk? 10 do you drive?  yes Do you have any goals in this area?  yes  Function not employed: date last employed . disabled: date disabled . I need assistance with the following:  household duties and shopping Do you have any goals in this area?  yes  Neuro/Psych trouble walking  Prior Studies Any changes since last visit?  no  Physicians involved in your care Any changes since last visit?  no   Family History  Problem Relation Age of Onset  . Heart disease Mother 56       CHF; CAD  . Hypertension Mother   . Heart failure Mother   . Diabetes Sister   . Lupus Sister   . Kidney failure Sister   . Hypertension Brother   . Hyperlipidemia Sister   . Hypertension Sister    Social History   Social History  . Marital status:  Widowed    Spouse name: N/A  . Number of children: 3  . Years of education: N/A   Occupational History  . 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 Smoking status: Former Smoker    Packs/day: 0.50    Years: 20.00    Types: Cigarettes    Quit date: 07/21/2008  . Smokeless tobacco: Never Used  . Alcohol use No  . Drug use: No  . Sexual activity: Not Asked   Other Topics Concern  . None   Social History Narrative   Marital status: widowed since 2002; not dating which is bad in 2018      Children: 3 children (45, 74, 86); 13 grandchildren; 1 gg      Employment: disability since 2009; retail      Lives: alone in [place] in apartment      Tobacco: quit in 2011      Alcohol: none      Drugs: none      Exercise: minimal in 2018      ADLs: independent with ADLs; drives      Seatbelt: 100%; no texting.   Past Surgical History:  Procedure Laterality Date  . ABDOMINAL HYSTERECTOMY     DUB; ovaries intact  . CARDIAC CATHETERIZATION  1990's   [name]  . COLON SURGERY  05/11/11   [facility] of adhesions Crohn's  .  SPINE SURGERY     Past Medical History:  Diagnosis Date  . Crohn's disease ([place])   . Disorder of sacroiliac joint   . Hyperlipidemia   . Hypertension   . Lumbago   . Lumbar post-laminectomy syndrome   . Lumbosacral neuritis   . Lumbosacral radiculitis   . Sciatica    There were no vitals taken for this visit.  Opioid Risk Score:   Fall Risk Score:  `1  Depression screen PHQ 2/9  Depression screen [facility] 2/9 10/25/2016 09/27/2016 09/21/2016 05/10/2016 03/09/2016 01/19/2016 01/15/2016  Decreased Interest 0 0 0 0 0 - 0  Down, Depressed, Hopeless 0 0 0 0 0 - 0  PHQ - 2 Score 0 0 0 0 0 - 0  Altered sleeping - - - - - 0 -  Tired, decreased energy - - - - - 0 -  Change in appetite - - - - - - -  Feeling bad or failure about yourself  - - - - - - -  Trouble concentrating - - - - - - -  Moving slowly or fidgety/restless - - - - - - -    Suicidal thoughts - - - - - - -  PHQ-9 Score - - - - - - -  Difficult doing work/chores - - - - - - -    Review of Systems  Constitutional: Negative.   HENT: Negative.   Eyes: Negative.   Respiratory: Negative.   Cardiovascular: Negative.   Gastrointestinal: Negative.   Endocrine: Negative.   Genitourinary: Negative.   Musculoskeletal: Positive for back pain and gait problem.  Skin: Negative.   Allergic/Immunologic: Negative.   Hematological: Negative.   Psychiatric/Behavioral: Negative.   All other systems reviewed and are negative.      Objective:   Physical Exam  Constitutional: She is oriented to person, place, and time. She appears well-developed and well-nourished.  HENT:  Head: Normocephalic and atraumatic.  Neck: Normal range of motion. Neck supple.  Cardiovascular: Normal rate and regular rhythm.   Pulmonary/Chest: Effort normal and breath sounds normal.  Musculoskeletal:  Normal Muscle Bulk and Muscle Testing Reveals: Upper Extremities: Full ROM and Muscle Strength 5/5 Lumbar Paraspinal Tenderness: L-3-L-5 Wearing Back Brace Lower Extremities: Full ROM and Muscle Strength 5/5 Arises from Table slowly using straight cane for support Narrow Based Gait   Neurological: She is alert and oriented to person, place, and time.  Skin: Skin is warm and dry.  Psychiatric: She has a normal mood and affect.  Nursing note and vitals reviewed.         Assessment & Plan:  1.Lumbar Postlaminectomy/ Spinal Stenosis Lumbar Region/ Post-Op Pain/Low back pain with radiating symptoms in L3-4 distribution: continues to be limited by pain. Refilled: Xtampza 18 mg ERone tablet every 12 hours#60 and Oxycodone 10 mg every 6 hours as needed #120.  Second script of Oxycodone given to accommodate scheduled appointment. 11/23/2016 . We will continue the opioid monitoring program, this consists of regular clinic visits, examinations, urine drug screen, pill counts as well as use of  [place] Controlled Substance Reporting System. Encouraged to Continue exercise regime.  2. Lumbar Radiculopathy: No complaints today.Continue Gabapentin. 11/23/2016 3. Chronic Right Knee Pain:No complaints today. Continue with Ice Therapy, Voltaren Gel. 11/23/2016 4. Left Greater Trochanteric Bursitis: No complaints today.Continue to Monitor. 11/23/2016  20 minutes of face to face patient care time was spent during this visit. All questions were encouraged and answered.   F/U in 1 month

## 2016-11-25 DIAGNOSIS — M960 Pseudarthrosis after fusion or arthrodesis: Secondary | ICD-10-CM | POA: Diagnosis not present

## 2016-11-25 DIAGNOSIS — I10 Essential (primary) hypertension: Secondary | ICD-10-CM | POA: Diagnosis not present

## 2016-11-29 ENCOUNTER — Telehealth: Payer: Self-pay | Admitting: *Deleted

## 2016-11-29 LAB — TOXASSURE SELECT,+ANTIDEPR,UR

## 2016-11-29 NOTE — Telephone Encounter (Signed)
Urine drug screen for this encounter is consistent for prescribed medication 

## 2016-12-01 DIAGNOSIS — Z981 Arthrodesis status: Secondary | ICD-10-CM | POA: Diagnosis not present

## 2016-12-01 DIAGNOSIS — M5136 Other intervertebral disc degeneration, lumbar region: Secondary | ICD-10-CM | POA: Diagnosis not present

## 2016-12-10 ENCOUNTER — Other Ambulatory Visit: Payer: Self-pay | Admitting: Family Medicine

## 2016-12-25 DIAGNOSIS — I10 Essential (primary) hypertension: Secondary | ICD-10-CM | POA: Diagnosis not present

## 2016-12-25 DIAGNOSIS — M960 Pseudarthrosis after fusion or arthrodesis: Secondary | ICD-10-CM | POA: Diagnosis not present

## 2017-01-02 ENCOUNTER — Other Ambulatory Visit: Payer: Self-pay | Admitting: Family Medicine

## 2017-01-02 ENCOUNTER — Encounter: Payer: Self-pay | Admitting: Registered Nurse

## 2017-01-02 ENCOUNTER — Encounter: Payer: Medicare Other | Attending: Physical Medicine & Rehabilitation | Admitting: Registered Nurse

## 2017-01-02 VITALS — BP 132/80 | HR 64

## 2017-01-02 DIAGNOSIS — M5414 Radiculopathy, thoracic region: Secondary | ICD-10-CM | POA: Diagnosis not present

## 2017-01-02 DIAGNOSIS — M961 Postlaminectomy syndrome, not elsewhere classified: Secondary | ICD-10-CM | POA: Diagnosis not present

## 2017-01-02 DIAGNOSIS — Z79899 Other long term (current) drug therapy: Secondary | ICD-10-CM

## 2017-01-02 DIAGNOSIS — M48061 Spinal stenosis, lumbar region without neurogenic claudication: Secondary | ICD-10-CM

## 2017-01-02 DIAGNOSIS — Z5181 Encounter for therapeutic drug level monitoring: Secondary | ICD-10-CM | POA: Diagnosis not present

## 2017-01-02 DIAGNOSIS — G894 Chronic pain syndrome: Secondary | ICD-10-CM

## 2017-01-02 DIAGNOSIS — N6310 Unspecified lump in the right breast, unspecified quadrant: Secondary | ICD-10-CM

## 2017-01-02 DIAGNOSIS — M62838 Other muscle spasm: Secondary | ICD-10-CM | POA: Diagnosis not present

## 2017-01-02 DIAGNOSIS — M5417 Radiculopathy, lumbosacral region: Secondary | ICD-10-CM | POA: Insufficient documentation

## 2017-01-02 MED ORDER — TIZANIDINE HCL 2 MG PO TABS: 2.0000 mg | ORAL_TABLET | Freq: Two times a day (BID) | ORAL | 1 refills | Status: DC | PRN

## 2017-01-02 MED ORDER — OXYCODONE ER 18 MG PO C12A: 18.0000 mg | EXTENDED_RELEASE_CAPSULE | Freq: Two times a day (BID) | ORAL | 0 refills | Status: DC

## 2017-01-02 MED ORDER — OXYCODONE HCL 10 MG PO TABS: 10.0000 mg | ORAL_TABLET | Freq: Four times a day (QID) | ORAL | 0 refills | Status: DC | PRN

## 2017-01-02 NOTE — Progress Notes (Signed)
Subjective:    Patient ID: [name] [name], female    DOB: 10/29/1955, 61 y.o.   MRN: [id number]  HPI: [name] is a 61year old female who returns for follow up appointmentfor chronic pain and medication refill.She states her pain is located in her lower back .She rates her pain 4. Her current exercise regime is walking and performing stretching exercises.  [name] [name] states since 2009 she has had thoracic tenderness ( muscle spasm), and has noticed increase intensity of pain. Will prescribe tizanidine today, she verbalizes understanding. Also will follow up with her surgeon and PCP she states   Her last UDS was 11/23/2016 it was consistent.   She underwent uncomplicated Bilateral re-exploration Lumbar Decompression L-2-3, L3-L4, L-4- 5L5-S1 metal removal (expedium) L2-3, L5-S1 Fusion exploration L4-5, L5-S1 Repair Non-Union with Re instrumentation Left iliac crest bone graftwith [name] [name] on 07/22/2016.   Pain Inventory Average Pain 4 Pain Right Now 4 My pain is constant, sharp and aching  In the last 24 hours, has pain interfered with the following? General activity 3 Relation with others 3 Enjoyment of life 3 What TIME of day is your pain at its worst? morning Sleep (in general) Fair  Pain is worse with: walking, inactivity and standing Pain improves with: rest, heat/ice and medication Relief from Meds: 4  Mobility use a cane do you drive?  yes  Function disabled: date disabled 5/09  Neuro/Psych No problems in this area  Prior Studies Any changes since last visit?  no  Physicians involved in your care Any changes since last visit?  no   Family History  Problem Relation Age of Onset  . Heart disease Mother 47       CHF; CAD  . Hypertension Mother   . Heart failure Mother   . Diabetes Sister   . Lupus Sister   . Kidney failure Sister   . Hypertension Brother   . Hyperlipidemia Sister   . Hypertension Sister    Social History    Social History  . Marital status: Widowed    Spouse name: N/A  . Number of children: 3  . Years of education: N/A   Occupational History  . 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 Smoking status: Former Smoker    Packs/day: 0.50    Years: 20.00    Types: Cigarettes    Quit date: 07/21/2008  . Smokeless tobacco: Never Used  . Alcohol use No  . Drug use: No  . Sexual activity: Not on file   Other Topics Concern  . Not on file   Social History Narrative   Marital status: widowed since 2002; not dating which is bad in 2018      Children: 3 children (45, 82, 66); 13 grandchildren; 1 gg      Employment: disability since 2009; retail      Lives: alone in [name]      Tobacco: quit in 2011      Alcohol: none      Drugs: none      Exercise: minimal in 2018      ADLs: independent with ADLs; drives      Seatbelt: 100%; no texting.   Past Surgical History:  Procedure Laterality Date  . ABDOMINAL HYSTERECTOMY     DUB; ovaries intact  . CARDIAC CATHETERIZATION  1990's   [name] [name]  . COLON SURGERY  05/11/11   [facility] of  adhesions Crohn's  . SPINE SURGERY     Past Medical History:  Diagnosis Date  . Crohn's disease ([place])   . Disorder of sacroiliac joint   . Hyperlipidemia   . Hypertension   . Lumbago   . Lumbar post-laminectomy syndrome   . Lumbosacral neuritis   . Lumbosacral radiculitis   . Sciatica    There were no vitals taken for this visit.  Opioid Risk Score:   Fall Risk Score:  `1  Depression screen PHQ 2/9  Depression screen [facility] 2/9 01/02/2017 10/25/2016 09/27/2016 09/21/2016 05/10/2016 03/09/2016 01/19/2016  Decreased Interest 0 0 0 0 0 0 -  Down, Depressed, Hopeless 0 0 0 0 0 0 -  PHQ - 2 Score 0 0 0 0 0 0 -  Altered sleeping - - - - - - 0  Tired, decreased energy - - - - - - 0  Change in appetite - - - - - - -  Feeling bad or failure about yourself  - - - - - - -  Trouble concentrating - - - - - - -  Moving  slowly or fidgety/restless - - - - - - -  Suicidal thoughts - - - - - - -  PHQ-9 Score - - - - - - -  Difficult doing work/chores - - - - - - -    Review of Systems  Constitutional: Negative.   HENT: Negative.   Eyes: Negative.   Respiratory: Negative.   Cardiovascular: Negative.   Gastrointestinal: Negative.   Endocrine: Negative.   Genitourinary: Negative.   Musculoskeletal: Negative.   Skin: Negative.   Allergic/Immunologic: Negative.   Neurological: Negative.   Hematological: Negative.   Psychiatric/Behavioral: Negative.   All other systems reviewed and are negative.      Objective:   Physical Exam  Constitutional: She is oriented to person, place, and time. She appears well-developed and well-nourished.  HENT:  Head: Normocephalic and atraumatic.  Neck: Normal range of motion. Neck supple.  Cardiovascular: Normal rate and regular rhythm.   Pulmonary/Chest: Effort normal and breath sounds normal.  Musculoskeletal:  Normal Muscle Bulk and Muscle Testing Reveals: Upper Extremities: Full ROM and Muscle Strength 5/5  Thoracic Paraspinal Tenderness: T-2-T-3 Lumbar Paraspinal Tenderness: L-3-L-5 Lower Extremities: Full ROM and Muscle Strength 5/5  Neurological: She is alert and oriented to person, place, and time.  Skin: Skin is warm and dry.  Psychiatric: She has a normal mood and affect.  Nursing note and vitals reviewed.         Assessment & Plan:  1.Lumbar Postlaminectomy/ Spinal Stenosis Lumbar Region/ Post-Op Pain/Low back pain with radiating symptoms in L3-4 distribution: continues to be limited by pain. Refilled: Xtampza 18 mg ERone tablet every 12 hours#60 and Oxycodone 10 mg every 6 hours as needed #120. 01/02/2017 . We will continue the opioid monitoring program, this consists of regular clinic visits, examinations, urine drug screen, pill counts as well as use of [place] Controlled Substance Reporting System. Encouraged to Continue exercise  regime.  2. Lumbar Radiculopathy: No complaints today.Continue Gabapentin. 01/02/2017 3. Chronic Right Knee Pain:No complaints today. Continue with Ice Therapy, Voltaren Gel. 01/02/2017 4. Muscle Spasm: RX: Tizanidine  20 minutes of face to face patient care time was spent during this visit. All questions were encouraged and answered.   F/U in 1 month

## 2017-01-16 ENCOUNTER — Other Ambulatory Visit: Payer: Self-pay | Admitting: Registered Nurse

## 2017-01-24 ENCOUNTER — Encounter: Payer: Medicare Other | Attending: Physical Medicine & Rehabilitation | Admitting: Registered Nurse

## 2017-01-24 ENCOUNTER — Encounter: Payer: Self-pay | Admitting: Registered Nurse

## 2017-01-24 ENCOUNTER — Other Ambulatory Visit: Payer: Medicare Other

## 2017-01-24 VITALS — BP 136/89 | HR 73 | Resp 14

## 2017-01-24 DIAGNOSIS — Z79899 Other long term (current) drug therapy: Secondary | ICD-10-CM

## 2017-01-24 DIAGNOSIS — M62838 Other muscle spasm: Secondary | ICD-10-CM | POA: Diagnosis not present

## 2017-01-24 DIAGNOSIS — M5417 Radiculopathy, lumbosacral region: Secondary | ICD-10-CM | POA: Insufficient documentation

## 2017-01-24 DIAGNOSIS — G894 Chronic pain syndrome: Secondary | ICD-10-CM | POA: Diagnosis not present

## 2017-01-24 DIAGNOSIS — M5414 Radiculopathy, thoracic region: Secondary | ICD-10-CM | POA: Insufficient documentation

## 2017-01-24 DIAGNOSIS — Z5181 Encounter for therapeutic drug level monitoring: Secondary | ICD-10-CM | POA: Diagnosis not present

## 2017-01-24 DIAGNOSIS — M961 Postlaminectomy syndrome, not elsewhere classified: Secondary | ICD-10-CM | POA: Diagnosis not present

## 2017-01-24 DIAGNOSIS — M48061 Spinal stenosis, lumbar region without neurogenic claudication: Secondary | ICD-10-CM | POA: Diagnosis not present

## 2017-01-24 MED ORDER — OXYCODONE HCL 10 MG PO TABS: 10.0000 mg | ORAL_TABLET | Freq: Four times a day (QID) | ORAL | 0 refills | Status: DC | PRN

## 2017-01-24 MED ORDER — OXYCODONE ER 18 MG PO C12A: 18.0000 mg | EXTENDED_RELEASE_CAPSULE | Freq: Two times a day (BID) | ORAL | 0 refills | Status: DC

## 2017-01-24 NOTE — Progress Notes (Signed)
Subjective:    Patient ID: [name] [name], female    DOB: 08/30/1955, 61 y.o.   MRN: [id number]  HPI: [name] [name] is a 61year old female who returns for follow up appointmentfor chronic pain and medication refill.She states her pain is located in her lower back .She rates her pain 5. Her current exercise regime is walking and performing stretching exercises. Awaiting on release from Dr. Velna Ochs to join silver sneakers.   Her last UDS was 11/23/2016 it was consistent.   She underwent uncomplicated Bilateral re-exploration Lumbar Decompression L-2-3, L3-L4, L-4- 5L5-S1 metal removal (expedium) L2-3, L5-S1 Fusion exploration L4-5, L5-S1 Repair Non-Union with Re instrumentation Left iliac crest bone graftwith Dr. Velna Ochs on 07/22/2016.   Pain Inventory Average Pain 6 Pain Right Now 5 My pain is constant, sharp and aching  In the last 24 hours, has pain interfered with the following? General activity 4 Relation with others 4 Enjoyment of life 4 What TIME of day is your pain at its worst? morning, evening, night Sleep (in general) Fair  Pain is worse with: bending, sitting and standing Pain improves with: rest, heat/ice and medication Relief from Meds: 4  Mobility walk with assistance use a cane ability to climb steps?  yes do you drive?  yes Do you have any goals in this area?  yes  Function not employed: date last employed 08/2006 disabled: date disabled 10/2007 Do you have any goals in this area?  yes  Neuro/Psych trouble walking  Prior Studies Any changes since last visit?  no  Physicians involved in your care Any changes since last visit?  no   Family History  Problem Relation Age of Onset  . Heart disease Mother 2       CHF; CAD  . Hypertension Mother   . Heart failure Mother   . Diabetes Sister   . Lupus Sister   . Kidney failure Sister   . Hypertension Brother   . Hyperlipidemia Sister   . Hypertension Sister    Social History    Social History  . Marital status: Widowed    Spouse name: N/A  . Number of children: 3  . Years of education: N/A   Occupational History  . 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 Smoking status: Former Smoker    Packs/day: 0.50    Years: 20.00    Types: Cigarettes    Quit date: 07/21/2008  . Smokeless tobacco: Never Used  . Alcohol use No  . Drug use: No  . Sexual activity: Not Asked   Other Topics Concern  . None   Social History Narrative   Marital status: widowed since 2002; not dating which is bad in 2018      Children: 3 children (45, 57, 50); 13 grandchildren; 1 gg      Employment: disability since 2009; retail      Lives: alone in [place] in apartment      Tobacco: quit in 2011      Alcohol: none      Drugs: none      Exercise: minimal in 2018      ADLs: independent with ADLs; drives      Seatbelt: 100%; no texting.   Past Surgical History:  Procedure Laterality Date  . ABDOMINAL HYSTERECTOMY     DUB; ovaries intact  . CARDIAC CATHETERIZATION  1990's   [name] [name]  . COLON SURGERY  05/11/11   [facility]  Lysis of adhesions Crohn's  . SPINE SURGERY     Past Medical History:  Diagnosis Date  . Crohn's disease ([place])   . Disorder of sacroiliac joint   . Hyperlipidemia   . Hypertension   . Lumbago   . Lumbar post-laminectomy syndrome   . Lumbosacral neuritis   . Lumbosacral radiculitis   . Sciatica    BP 136/89 (BP Location: Right Arm, Patient Position: Sitting, Cuff Size: Large)   Pulse 73   Resp 14   SpO2 94%   Opioid Risk Score:   Fall Risk Score:  `1  Depression screen PHQ 2/9  Depression screen [facility] 2/9 01/02/2017 10/25/2016 09/27/2016 09/21/2016 05/10/2016 03/09/2016 01/19/2016  Decreased Interest 0 0 0 0 0 0 -  Down, Depressed, Hopeless 0 0 0 0 0 0 -  PHQ - 2 Score 0 0 0 0 0 0 -  Altered sleeping - - - - - - 0  Tired, decreased energy - - - - - - 0  Change in appetite - - - - - - -  Feeling bad or failure about  yourself  - - - - - - -  Trouble concentrating - - - - - - -  Moving slowly or fidgety/restless - - - - - - -  Suicidal thoughts - - - - - - -  PHQ-9 Score - - - - - - -  Difficult doing work/chores - - - - - - -    Review of Systems  Constitutional: Positive for appetite change, diaphoresis and unexpected weight change.  HENT: Negative.   Eyes: Negative.   Respiratory: Negative.   Cardiovascular: Negative.   Gastrointestinal: Positive for nausea.  Endocrine: Negative.   Genitourinary: Negative.   Musculoskeletal: Positive for back pain and gait problem.  Skin: Negative.   Allergic/Immunologic: Negative.   Psychiatric/Behavioral: Negative.        Objective:   Physical Exam  Constitutional: She is oriented to person, place, and time. She appears well-developed and well-nourished.  HENT:  Head: Normocephalic and atraumatic.  Neck: Normal range of motion. Neck supple.  Cardiovascular: Normal rate and regular rhythm.   Pulmonary/Chest: Effort normal and breath sounds normal.  Musculoskeletal:  Normal Muscle Bulk and Muscle Testing Reveals: Upper Extremities: Full ROM and Muscle Strength 5/5 Lumbar Paraspinal Tenderness: L-4-L-5 Lower Extremities: Full ROM and Muscle Strength 5/5 Arises from Table Slowly using Straight Cane for Support Narrow Based Gait   Neurological: She is alert and oriented to person, place, and time.  Skin: Skin is warm and dry.  Psychiatric: She has a normal mood and affect.  Nursing note and vitals reviewed.         Assessment & Plan:  1.Lumbar Postlaminectomy/ Spinal Stenosis Lumbar Region/ Post-Op Pain/Low back pain with radiating symptoms in L3-4 distribution: continues to be limited by pain. Refilled: Xtampza 18 mg ERone tablet every 12 hours#60 and Oxycodone 10 mg every 6 hours as needed #120. 01/24/2017 . We will continue the opioid monitoring program, this consists of regular clinic visits, examinations, urine drug screen, pill counts  as well as use of [place] Controlled Substance Reporting System. Encouraged to Continue exercise regime.  2. Lumbar Radiculopathy: No complaints today.Continue Gabapentin. 01/24/2017 3. Chronic Right Knee Pain:No complaints today. Continue with Ice Therapy, Voltaren Gel. 01/24/2017 4. Muscle Spasm: Continue: Tizanidine  20 minutes of face to face patient care time was spent during this visit. All questions were encouraged and answered.  F/U in 1 month

## 2017-01-25 DIAGNOSIS — M960 Pseudarthrosis after fusion or arthrodesis: Secondary | ICD-10-CM | POA: Diagnosis not present

## 2017-01-25 DIAGNOSIS — I10 Essential (primary) hypertension: Secondary | ICD-10-CM | POA: Diagnosis not present

## 2017-01-31 ENCOUNTER — Other Ambulatory Visit: Payer: Medicare Other

## 2017-02-09 ENCOUNTER — Ambulatory Visit
Admission: RE | Admit: 2017-02-09 | Discharge: 2017-02-09 | Disposition: A | Discharge status: Home / Self Care | Payer: Medicare Other | Source: Ambulatory Visit | Attending: Family Medicine | Admitting: Family Medicine

## 2017-02-09 DIAGNOSIS — N6489 Other specified disorders of breast: Secondary | ICD-10-CM | POA: Diagnosis not present

## 2017-02-09 DIAGNOSIS — R928 Other abnormal and inconclusive findings on diagnostic imaging of breast: Secondary | ICD-10-CM | POA: Diagnosis not present

## 2017-02-09 DIAGNOSIS — N6310 Unspecified lump in the right breast, unspecified quadrant: Secondary | ICD-10-CM

## 2017-02-23 ENCOUNTER — Encounter: Payer: Medicare Other | Attending: Physical Medicine & Rehabilitation | Admitting: Registered Nurse

## 2017-02-23 ENCOUNTER — Encounter: Payer: Self-pay | Admitting: Registered Nurse

## 2017-02-23 VITALS — BP 148/92 | HR 69

## 2017-02-23 DIAGNOSIS — G894 Chronic pain syndrome: Secondary | ICD-10-CM

## 2017-02-23 DIAGNOSIS — M5414 Radiculopathy, thoracic region: Secondary | ICD-10-CM | POA: Insufficient documentation

## 2017-02-23 DIAGNOSIS — Z79899 Other long term (current) drug therapy: Secondary | ICD-10-CM | POA: Diagnosis not present

## 2017-02-23 DIAGNOSIS — M961 Postlaminectomy syndrome, not elsewhere classified: Secondary | ICD-10-CM | POA: Diagnosis not present

## 2017-02-23 DIAGNOSIS — Z5181 Encounter for therapeutic drug level monitoring: Secondary | ICD-10-CM | POA: Diagnosis not present

## 2017-02-23 DIAGNOSIS — M5417 Radiculopathy, lumbosacral region: Secondary | ICD-10-CM | POA: Insufficient documentation

## 2017-02-23 DIAGNOSIS — M48061 Spinal stenosis, lumbar region without neurogenic claudication: Secondary | ICD-10-CM

## 2017-02-23 MED ORDER — OXYCODONE HCL 10 MG PO TABS: 10.0000 mg | ORAL_TABLET | Freq: Four times a day (QID) | ORAL | 0 refills | Status: DC | PRN

## 2017-02-23 MED ORDER — MORPHINE-NALTREXONE 50-2 MG PO CPCR: 1.0000 | ORAL_CAPSULE | Freq: Every day | ORAL | 0 refills | Status: DC

## 2017-02-23 MED ORDER — MORPHINE SULFATE 15 MG PO TABS: 15.0000 mg | ORAL_TABLET | Freq: Two times a day (BID) | ORAL | 0 refills | Status: DC | PRN

## 2017-02-23 MED ORDER — OXYCODONE ER 18 MG PO C12A: 18.0000 mg | EXTENDED_RELEASE_CAPSULE | Freq: Two times a day (BID) | ORAL | 0 refills | Status: DC

## 2017-02-23 NOTE — Addendum Note (Signed)
Addended by: Bayard Hugger on: 02/23/2017 06:31 PM   Modules accepted: Level of Service

## 2017-02-23 NOTE — Progress Notes (Signed)
Subjective:    Patient ID: [name] [name], female    DOB: 1956-01-05, 61 y.o.   MRN: [id number]  HPI: [name] is a 61year old female who returns for follow up appointmentfor chronic pain and medication refill.She states her pain is located in her lower back .She rates her pain 4. Also states the Ginger Organ is not controlling her pain, she asked if she could resume her MS Contin. Discussed with Dr. Letta Pate we will prescribe Morphabond. Checked [name], retail, [employer] doesn't cover the MS Contin they cover Embeda, spoke with [facility] Physical Medicine Pharmacist, reviewed Up to Date information and discussed with Dr. Letta Pate.   We will prescribe Embeda 50 mg daily dose and MSIR 15 mg twice a day as needed for moderate and severe pain. Total time spent regarding medication changes and education this provider spent over 60 minutes.   Her current exercise regime is walking and performing stretching exercises.   Her last UDS was 11/23/2016 it was consistent.   She underwent uncomplicated Bilateral re-exploration Lumbar Decompression L-2-3, L3-L4, L-4- 5L5-S1 metal removal (expedium) L2-3, L5-S1 Fusion exploration L4-5, L5-S1 Repair Non-Union with Re instrumentation Left iliac crest bone graftwith Dr. Velna Ochs on 07/22/2016.   Pain Inventory Average Pain 5 Pain Right Now 4 My pain is constant, stabbing and aching  In the last 24 hours, has pain interfered with the following? General activity 5 Relation with others 5 Enjoyment of life 5 What TIME of day is your pain at its worst? morning and night Sleep (in general) Fair  Pain is worse with: walking and standing Pain improves with: rest, heat/ice and medication Relief from Meds: na  Mobility how many minutes can you walk? 10 ability to climb steps?  yes do you drive?  yes  Function disabled: date disabled 10/2007 I need assistance with the following:  household duties  Neuro/Psych spasms  Prior  Studies Any changes since last visit?  no  Physicians involved in your care Any changes since last visit?  no   Family History  Problem Relation Age of Onset  . Heart disease Mother 96       CHF; CAD  . Hypertension Mother   . Heart failure Mother   . Diabetes Sister   . Lupus Sister   . Kidney failure Sister   . Hypertension Brother   . Hyperlipidemia Sister   . Hypertension Sister    Social History   Social History  . Marital status: Widowed    Spouse name: N/A  . Number of children: 3  . Years of education: N/A   Occupational History  . 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 Smoking status: Former Smoker    Packs/day: 0.50    Years: 20.00    Types: Cigarettes    Quit date: 07/21/2008  . Smokeless tobacco: Never Used  . Alcohol use No  . Drug use: No  . Sexual activity: Not Asked   Other Topics Concern  . None   Social History Narrative   Marital status: widowed since 2002; not dating which is bad in 2018      Children: 3 children (45, 32, 84); 13 grandchildren; 1 gg      Employment: disability since 2009; retail      Lives: alone in [place] in apartment      Tobacco: quit in 2011      Alcohol: none  Drugs: none      Exercise: minimal in 2018      ADLs: independent with ADLs; drives      Seatbelt: 100%; no texting.   Past Surgical History:  Procedure Laterality Date  . ABDOMINAL HYSTERECTOMY     DUB; ovaries intact  . BREAST BIOPSY    . CARDIAC CATHETERIZATION  1990's   [name] [name]  . COLON SURGERY  05/11/11   [facility] Lysis of adhesions Crohn's  . REDUCTION MAMMAPLASTY    . SPINE SURGERY     Past Medical History:  Diagnosis Date  . Crohn's disease ([place])   . Disorder of sacroiliac joint   . Hyperlipidemia   . Hypertension   . Lumbago   . Lumbar post-laminectomy syndrome   . Lumbosacral neuritis   . Lumbosacral radiculitis   . Sciatica    BP (!) 148/92   Pulse 69   SpO2 96%   Opioid Risk Score:   0 Fall Risk Score:  `1  Depression screen PHQ 2/9  Depression screen [facility] 2/9 02/23/2017 01/02/2017 10/25/2016 09/27/2016 09/21/2016 05/10/2016 03/09/2016  Decreased Interest 0 0 0 0 0 0 0  Down, Depressed, Hopeless 0 0 0 0 0 0 0  PHQ - 2 Score 0 0 0 0 0 0 0  Altered sleeping - - - - - - -  Tired, decreased energy - - - - - - -  Change in appetite - - - - - - -  Feeling bad or failure about yourself  - - - - - - -  Trouble concentrating - - - - - - -  Moving slowly or fidgety/restless - - - - - - -  Suicidal thoughts - - - - - - -  PHQ-9 Score - - - - - - -  Difficult doing work/chores - - - - - - -   Review of Systems  Constitutional: Positive for appetite change, diaphoresis and unexpected weight change.  Eyes: Negative.   Respiratory: Negative.   Cardiovascular: Negative.   Gastrointestinal: Positive for nausea.  Endocrine: Negative.   Genitourinary: Negative.   Musculoskeletal:       Spasms  Skin: Negative.   Allergic/Immunologic: Negative.   Neurological: Negative.   Hematological: Negative.   Psychiatric/Behavioral: Negative.   All other systems reviewed and are negative.      Objective:   Physical Exam  Constitutional: She is oriented to person, place, and time. She appears well-developed and well-nourished.  HENT:  Head: Normocephalic and atraumatic.  Neck: Normal range of motion. Neck supple.  Cardiovascular: Normal rate and regular rhythm.   Pulmonary/Chest: Effort normal and breath sounds normal.  Musculoskeletal:  Normal Muscle Bulk and Muscle Testing Reveals: Upper Extremities: Full ROM and Muscle Strength 5/5 Lumbar Paraspinal Tenderness: L-4-L-5 Lower Extremities: Full ROM and Muscle Strength 5/5 Arises from Table Slowly, using straight cane for support Narrow Based Gait   Neurological: She is oriented to person, place, and time.  Skin: Skin is warm and dry.  Psychiatric: She has a normal mood and affect.  Nursing note and vitals reviewed.          Assessment & Plan:  1.Lumbar Postlaminectomy/ Spinal Stenosis Lumbar Region/ Post-Op Pain/Low back pain with radiating symptoms in L3-4 distribution: continues to be limited by pain. RX: Embeda 50 mg  One capsule daily and MSIR 15 mg one tablet twice a day as needed for moderate to sever pain.  02/23/2017 . We will continue the opioid monitoring program, this consists  of regular clinic visits, examinations, urine drug screen, pill counts as well as use of [place] Controlled Substance Reporting System. Encouraged to Continue exercise regime.  2. Lumbar Radiculopathy: No complaints today.Continue Gabapentin. 02/23/2017 3. Chronic Right Knee Pain:No complaints today. Continue with Ice Therapy, Voltaren Gel. 02/23/2017 4. Muscle Spasm: Continue: Tizanidine. 02/23/2017  > 60  minutes of face to face patient care time was spent during this visit. All questions were encouraged and answered.  F/U in 1 month

## 2017-02-23 NOTE — Patient Instructions (Signed)
Call office on Monday 02/28/2017, to evaluate medication

## 2017-02-24 ENCOUNTER — Other Ambulatory Visit: Payer: Self-pay | Admitting: Urgent Care

## 2017-02-24 DIAGNOSIS — I10 Essential (primary) hypertension: Secondary | ICD-10-CM

## 2017-02-25 DIAGNOSIS — M960 Pseudarthrosis after fusion or arthrodesis: Secondary | ICD-10-CM | POA: Diagnosis not present

## 2017-02-25 DIAGNOSIS — I10 Essential (primary) hypertension: Secondary | ICD-10-CM | POA: Diagnosis not present

## 2017-02-27 NOTE — Telephone Encounter (Signed)
°  MyChart message sent to pt about making an apt for more refills

## 2017-03-05 ENCOUNTER — Other Ambulatory Visit: Payer: Self-pay | Admitting: Registered Nurse

## 2017-03-14 ENCOUNTER — Other Ambulatory Visit: Payer: Self-pay | Admitting: Registered Nurse

## 2017-03-20 ENCOUNTER — Encounter: Payer: Self-pay | Admitting: Registered Nurse

## 2017-03-20 ENCOUNTER — Encounter: Payer: Medicare Other | Attending: Physical Medicine & Rehabilitation | Admitting: Registered Nurse

## 2017-03-20 ENCOUNTER — Other Ambulatory Visit: Payer: Self-pay

## 2017-03-20 VITALS — BP 138/85 | HR 69

## 2017-03-20 DIAGNOSIS — Z79899 Other long term (current) drug therapy: Secondary | ICD-10-CM | POA: Diagnosis not present

## 2017-03-20 DIAGNOSIS — M5414 Radiculopathy, thoracic region: Secondary | ICD-10-CM | POA: Diagnosis not present

## 2017-03-20 DIAGNOSIS — Z5181 Encounter for therapeutic drug level monitoring: Secondary | ICD-10-CM | POA: Diagnosis not present

## 2017-03-20 DIAGNOSIS — G8929 Other chronic pain: Secondary | ICD-10-CM

## 2017-03-20 DIAGNOSIS — M961 Postlaminectomy syndrome, not elsewhere classified: Secondary | ICD-10-CM | POA: Diagnosis not present

## 2017-03-20 DIAGNOSIS — M48061 Spinal stenosis, lumbar region without neurogenic claudication: Secondary | ICD-10-CM

## 2017-03-20 DIAGNOSIS — M5417 Radiculopathy, lumbosacral region: Secondary | ICD-10-CM | POA: Insufficient documentation

## 2017-03-20 DIAGNOSIS — M62838 Other muscle spasm: Secondary | ICD-10-CM

## 2017-03-20 DIAGNOSIS — G894 Chronic pain syndrome: Secondary | ICD-10-CM

## 2017-03-20 DIAGNOSIS — M546 Pain in thoracic spine: Secondary | ICD-10-CM

## 2017-03-20 MED ORDER — MORPHINE-NALTREXONE 50-2 MG PO CPCR: 1.0000 | ORAL_CAPSULE | Freq: Every day | ORAL | 0 refills | Status: DC

## 2017-03-20 MED ORDER — MORPHINE SULFATE 15 MG PO TABS: 15.0000 mg | ORAL_TABLET | Freq: Two times a day (BID) | ORAL | 0 refills | Status: DC | PRN

## 2017-03-20 NOTE — Progress Notes (Signed)
Subjective:    Patient ID: [name] [name], female    DOB: 1955-11-09, 61 y.o.   MRN: [id number]  HPI: [name] is a 61year old female who returns for follow up appointmentfor chronic pain and medication refill.She states her pain is located in her lower back. .She rates her pain 6.Her current exercise regime is walking and performing stretching exercises.   [name] [name] brought in her Embeda, MSIR and Oxycodone, medications were counted and documented per office policy. Last month [name] [name] was seen on 02/23/2017, the oxycodone bottle she brought in was from 01/25/2017 with two tablets. Last month [name] [name] opioid medications were changed to [facility] and MSIR, her oxycodone was discontinued and no script was printed. Upon reviewing the PMP site and calling her pharmacy  ( [facility]) the oxycodone was written on 01/24/2017 and she filled it on 02/21/2017. [name] [name] asked if she could continue with the Oxycodone since she was taken them QID as needed and the MSIR will be twice a day as needed. Spent over 30 minutes educating [name] [name] on MME and the Opioid Crisis, she verbalizes understanding. The Oxycodone tablets were destroyed per policy, the above was discussed with Dr. Letta Pate.   [name] [name] MME History Reviewed: 1.2017:  Prescribed Morphine Sulfate  and MSIR: 90MME  2. 05/19/2016 Morphine Sulfate was changed to Xtampza 18 mg  due to ineffectiveness and MSIR changed to Oxycodone  10 mg #60: 84MME  3. [name] [name] underwent uncomplicated  Bilateral re-exploration Lumbar Decompression L-2-3, L3-L4, L-4- 5L5-S1 metal removal (expedium) L2-3, L5-S1 Fusion exploration L4-5, L5-S1 Repair Non-Union with Re instrumentation Left iliac crest bone graftwith Dr. Velna Ochs on 07/22/2016. 4. Oxycodone 10 mg  was increased to QID, due to the above surgery. 5. Since 08/11/2016 till 02/23/2017 she was prescribed Xtampza 18 mg Q 12 hours and Oxycodone 10 mg QID: 114MME 6. On 02/23/2017: Her Xtampza was  changed to Embeda due to ineffectiveness: Her current prescription is Embeda 50 mg and MSIR 15 mg twice a day for sever pain: 80 MME  Her last UDS was 11/23/2016 it was consistent.   She underwent uncomplicated Bilateral re-exploration Lumbar Decompression L-2-3, L3-L4, L-4- 5L5-S1 metal removal (expedium) L2-3, L5-S1 Fusion exploration L4-5, L5-S1 Repair Non-Union with Re instrumentation Left iliac crest bone graftwith Dr. Velna Ochs on 07/22/2016.   Pain Inventory Average Pain 5 Pain Right Now 6 My pain is intermittent, stabbing and aching  In the last 24 hours, has pain interfered with the following? General activity 8 Relation with others 8 Enjoyment of life 8 What TIME of day is your pain at its worst? morning, evening, night Sleep (in general) Fair  Pain is worse with: walking, inactivity and standing Pain improves with: rest, heat/ice and medication Relief from Meds: 3  Mobility walk without assistance ability to climb steps?  yes do you drive?  yes Do you have any goals in this area?  yes  Function disabled: date disabled 10/2007 I need assistance with the following:  household duties and shopping Do you have any goals in this area?  yes  Neuro/Psych spasms  Prior Studies Any changes since last visit?  no  Physicians involved in your care Any changes since last visit?  no   Family History  Problem Relation Age of Onset  . Heart disease Mother 52       CHF; CAD  . Hypertension Mother   . Heart failure Mother   . Diabetes Sister   . Lupus Sister   .  Kidney failure Sister   . Hypertension Brother   . Hyperlipidemia Sister   . Hypertension Sister    Social History   Social History  . Marital status: Widowed    Spouse name: N/A  . Number of children: 3  . Years of education: N/A   Occupational History  . 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 Smoking status: Former Smoker    Packs/day: 0.50    Years: 20.00     Types: Cigarettes    Quit date: 07/21/2008  . Smokeless tobacco: Never Used  . Alcohol use No  . Drug use: No  . Sexual activity: Not Asked   Other Topics Concern  . None   Social History Narrative   Marital status: widowed since 2002; not dating which is bad in 2018      Children: 3 children (45, 92, 30); 13 grandchildren; 1 gg      Employment: disability since 2009; retail      Lives: alone in [place] in apartment      Tobacco: quit in 2011      Alcohol: none      Drugs: none      Exercise: minimal in 2018      ADLs: independent with ADLs; drives      Seatbelt: 100%; no texting.   Past Surgical History:  Procedure Laterality Date  . ABDOMINAL HYSTERECTOMY     DUB; ovaries intact  . BREAST BIOPSY    . CARDIAC CATHETERIZATION  1990's   [name] [name]  . COLON SURGERY  05/11/11   [facility] Lysis of adhesions Crohn's  . REDUCTION MAMMAPLASTY    . SPINE SURGERY     Past Medical History:  Diagnosis Date  . Crohn's disease ([place])   . Disorder of sacroiliac joint   . Hyperlipidemia   . Hypertension   . Lumbago   . Lumbar post-laminectomy syndrome   . Lumbosacral neuritis   . Lumbosacral radiculitis   . Sciatica    BP 138/85 (BP Location: Right Arm, Patient Position: Sitting, Cuff Size: Normal)   Pulse 69   SpO2 92%   Opioid Risk Score:  0 Fall Risk Score:  `1  Depression screen PHQ 2/9  Depression screen [facility] 2/9 02/23/2017 01/02/2017 10/25/2016 09/27/2016 09/21/2016 05/10/2016 03/09/2016  Decreased Interest 0 0 0 0 0 0 0  Down, Depressed, Hopeless 0 0 0 0 0 0 0  PHQ - 2 Score 0 0 0 0 0 0 0  Altered sleeping - - - - - - -  Tired, decreased energy - - - - - - -  Change in appetite - - - - - - -  Feeling bad or failure about yourself  - - - - - - -  Trouble concentrating - - - - - - -  Moving slowly or fidgety/restless - - - - - - -  Suicidal thoughts - - - - - - -  PHQ-9 Score - - - - - - -  Difficult doing work/chores - - - - - - -   Review of Systems  Constitutional:  Positive for diaphoresis and unexpected weight change.  Eyes: Negative.   Respiratory: Negative.   Cardiovascular: Negative.   Gastrointestinal: Positive for abdominal pain and nausea.  Endocrine: Negative.   Genitourinary: Negative.   Musculoskeletal: Positive for back pain.       Spasms  Skin: Negative.   Allergic/Immunologic: Negative.   Neurological: Negative.  Hematological: Negative.   Psychiatric/Behavioral: Negative.   All other systems reviewed and are negative.      Objective:   Physical Exam  Constitutional: She is oriented to person, place, and time. She appears well-developed and well-nourished.  HENT:  Head: Normocephalic and atraumatic.  Neck: Normal range of motion. Neck supple.  Cardiovascular: Normal rate and regular rhythm.   Pulmonary/Chest: Effort normal and breath sounds normal.  Musculoskeletal:  Normal Muscle Bulk and Muscle Testing Reveals: Upper Extremities: Full ROM and Muscle Strength 5/5 Thoracic Paraspinal Tenderness: T-1-T-4 inflammed Lumbar Paraspinal Tenderness: L-4-L-5 Lower Extremities: Full ROM and Muscle Strength 5/5 Arises from Table Slowly, using straight cane for support Narrow Based Gait   Neurological: She is alert and oriented to person, place, and time.  Skin: Skin is warm and dry.  Psychiatric: She has a normal mood and affect.  Nursing note and vitals reviewed.         Assessment & Plan:  1.Lumbar Postlaminectomy/ Spinal Stenosis Lumbar Region/ Post-Op Pain/Low back pain with radiating symptoms in L3-4 distribution: continues to be limited by pain. Refilled: Embeda 50 mg  One capsule daily and MSIR 15 mg one tablet twice a day as needed for moderate to sever pain. 03/20/2017 . We will continue the opioid monitoring program, this consists of regular clinic visits, examinations, urine drug screen, pill counts as well as use of [place] Controlled Substance Reporting System. Encouraged to Continue exercise regime.   2. Lumbar Radiculopathy: No complaints today.Continue Gabapentin. 03/20/2017 3. Chronic Right Knee Pain:No complaints today. Continue with Ice Therapy, Voltaren Gel. 03/20/2017 4. Muscle Spasm: Continue: Tizanidine. 10//06/2016  30 minutes of face to face patient care time was spent during this visit. All questions were encouraged and answered.  F/U in 1 month

## 2017-03-21 ENCOUNTER — Other Ambulatory Visit: Payer: Self-pay | Admitting: Registered Nurse

## 2017-03-22 ENCOUNTER — Telehealth: Payer: Self-pay | Admitting: Registered Nurse

## 2017-03-22 NOTE — Telephone Encounter (Signed)
On 03/22/2017 the  [place] was reviewed no conflict was seen on the [place] with multiple prescribers. Ms. Makris has a signed narcotic contract with our office. If there were any discrepancies this would have been reported to her physician.

## 2017-03-22 NOTE — Telephone Encounter (Signed)
Recieved electronic medication refill request for promethazine, no mention to continue this medication in patients last note, also patient has not used this medication in over 4 months, it is ok to refill? Please advise

## 2017-03-23 ENCOUNTER — Telehealth: Payer: Self-pay

## 2017-03-23 NOTE — Telephone Encounter (Signed)
Patient was called and notified about this, stated has a primary care appointment that she will have this addressed with on the 04/03/17

## 2017-03-23 NOTE — Telephone Encounter (Signed)
If it was her pain medication, which caused her nausea. She would need to take the promethazine every day. I recommend that she gets this problem evaluated by her primary care physician and they can prescribe it if needed

## 2017-03-23 NOTE — Telephone Encounter (Signed)
[name] [name] called clinic yesterday, asked for clarification on why her phenergan was refused yesterday, states has been receiving medication since 2013, she mentioned that she does not take it regularly but she still uses it for as it helps her due to the medication she takes

## 2017-03-27 DIAGNOSIS — I10 Essential (primary) hypertension: Secondary | ICD-10-CM | POA: Diagnosis not present

## 2017-03-27 DIAGNOSIS — M960 Pseudarthrosis after fusion or arthrodesis: Secondary | ICD-10-CM | POA: Diagnosis not present

## 2017-03-28 ENCOUNTER — Ambulatory Visit: Payer: Medicare Other | Admitting: Family Medicine

## 2017-04-03 ENCOUNTER — Encounter: Payer: Self-pay | Admitting: Family Medicine

## 2017-04-03 ENCOUNTER — Ambulatory Visit (INDEPENDENT_AMBULATORY_CARE_PROVIDER_SITE_OTHER): Payer: Medicare Other | Admitting: Family Medicine

## 2017-04-03 ENCOUNTER — Encounter: Payer: Self-pay | Admitting: Gastroenterology

## 2017-04-03 VITALS — BP 132/72 | HR 91 | Temp 98.0°F | Resp 16 | Ht 64.96 in | Wt 219.0 lb

## 2017-04-03 DIAGNOSIS — Z981 Arthrodesis status: Secondary | ICD-10-CM | POA: Diagnosis not present

## 2017-04-03 DIAGNOSIS — M5136 Other intervertebral disc degeneration, lumbar region: Secondary | ICD-10-CM | POA: Diagnosis not present

## 2017-04-03 DIAGNOSIS — K6289 Other specified diseases of anus and rectum: Secondary | ICD-10-CM | POA: Diagnosis not present

## 2017-04-03 DIAGNOSIS — K50018 Crohn's disease of small intestine with other complication: Secondary | ICD-10-CM | POA: Diagnosis not present

## 2017-04-03 DIAGNOSIS — M961 Postlaminectomy syndrome, not elsewhere classified: Secondary | ICD-10-CM | POA: Diagnosis not present

## 2017-04-03 DIAGNOSIS — I10 Essential (primary) hypertension: Secondary | ICD-10-CM | POA: Diagnosis not present

## 2017-04-03 DIAGNOSIS — R110 Nausea: Secondary | ICD-10-CM | POA: Diagnosis not present

## 2017-04-03 DIAGNOSIS — N289 Disorder of kidney and ureter, unspecified: Secondary | ICD-10-CM

## 2017-04-03 DIAGNOSIS — E7800 Pure hypercholesterolemia, unspecified: Secondary | ICD-10-CM | POA: Diagnosis not present

## 2017-04-03 DIAGNOSIS — Z86010 Personal history of colonic polyps: Secondary | ICD-10-CM

## 2017-04-03 DIAGNOSIS — Z6836 Body mass index (BMI) 36.0-36.9, adult: Secondary | ICD-10-CM | POA: Diagnosis not present

## 2017-04-03 LAB — POCT URINALYSIS DIP (MANUAL ENTRY)
BILIRUBIN UA: NEGATIVE mg/dL
Bilirubin, UA: NEGATIVE
Blood, UA: NEGATIVE
Glucose, UA: NEGATIVE mg/dL
LEUKOCYTES UA: NEGATIVE
NITRITE UA: NEGATIVE
PH UA: 5.5 (ref 5.0–8.0)
PROTEIN UA: NEGATIVE mg/dL
Spec Grav, UA: 1.015 (ref 1.010–1.025)
Urobilinogen, UA: 0.2 E.U./dL

## 2017-04-03 NOTE — Patient Instructions (Addendum)
  Increase PREMPRO to one tablet every THIRD day. STOP taking PRILOSEC.     IF you received an x-ray today, you will receive an invoice from [facility]. Please contact [facility] at [phone] with questions or concerns regarding your invoice.   IF you received labwork today, you will receive an invoice from [facility]. Please contact [facility] at [phone] with questions or concerns regarding your invoice.   Our billing staff will not be able to assist you with questions regarding bills from these companies.  You will be contacted with the lab results as soon as they are available. The fastest way to get your results is to activate your My Chart account. Instructions are located on the last page of this paperwork. If you have not heard from [place] regarding the results in 2 weeks, please contact this office.

## 2017-04-03 NOTE — Progress Notes (Signed)
Subjective:    Patient ID: [name] [name], female    DOB: 1955/12/20, 61 y.o.   MRN: [id number]  04/03/2017  Hypertension (6 month follow-up) and Hyperlipidemia    HPI This 61 y.o. female presents for six month follow-up of hypertension, hypercholesterolemia.  Management at last visit included adding Prilosec for daily nausea, wean Prempro over upcoming six months.  Creatinine slightly elevated at 1.13.  Not checking BP at home; seeing pain specialist monthly so does not need to check BP at home.  BP ranges 137/72-147/62.   Pain specialist stopped Phenergan; recommended discussing with PCP; stopped it this month.  Pain specialist refused to refill Phenergan.  Has been maintained on Phenergan for four years.  Taking Omeprazole daily without improvement in nausea.  Decreased Prempro to every four days; having a lot more night sweats.  No night sweats much during the day; mostly at night.    S/p lumbar surgery six months ago; pain is better; having persistent lower back pain especially if sits too long.  Pain not controlled; dose not high enough.  Has been maintained on opiates since 2006.   Last gastroenterologist at [facility].  Last colonoscopy 2010.  Due for one now. Vomits at nighttime mostly with nausea; will awaken from sleep.  Has spells or episodes.  Will have RLQ cramping with nausea and vomiting.  Last episode two months ago.  Taking Prilosec did not help with nausea and vomiting.  Had been taking Phenergan twice per day to prevent these episodes that occurred once every two months.    BP Readings from Last 3 Encounters:  04/03/17 132/72  03/20/17 138/85  02/23/17 (!) 148/92   Wt Readings from Last 3 Encounters:  04/03/17 219 lb (99.3 kg)  09/21/16 230 lb (104.3 kg)  05/18/16 224 lb (101.6 kg)   Immunization History  Administered Date(s) Administered  . Influenza Split 04/23/2012  . Influenza,inj,Quad PF,6+ Mos 03/09/2016  . Tdap 05/10/2016    Review of Systems    Constitutional: Negative for chills, diaphoresis, fatigue and fever.  Eyes: Negative for visual disturbance.  Respiratory: Negative for cough and shortness of breath.   Cardiovascular: Negative for chest pain, palpitations and leg swelling.  Gastrointestinal: Positive for nausea. Negative for abdominal distention, abdominal pain, anal bleeding, blood in stool, constipation, diarrhea, rectal pain and vomiting.  Endocrine: Negative for cold intolerance, heat intolerance, polydipsia, polyphagia and polyuria.  Musculoskeletal: Positive for arthralgias, back pain, gait problem and joint swelling.  Neurological: Negative for dizziness, tremors, seizures, syncope, facial asymmetry, speech difficulty, weakness, light-headedness, numbness and headaches.    Past Medical History:  Diagnosis Date  . Crohn's disease ([place])   . Disorder of sacroiliac joint   . Hyperlipidemia   . Hypertension   . Lumbago   . Lumbar post-laminectomy syndrome   . Lumbosacral neuritis   . Lumbosacral radiculitis   . Sciatica    Past Surgical History:  Procedure Laterality Date  . ABDOMINAL HYSTERECTOMY     DUB; ovaries intact  . BREAST BIOPSY    . CARDIAC CATHETERIZATION  1990's   [name]  . COLON SURGERY  05/11/11   [facility] of adhesions Crohn's  . REDUCTION MAMMAPLASTY    . SPINE SURGERY     Allergies  Allergen Reactions  . Orudis [Ketoprofen] Hives, Itching and Rash    onSet: 07/30/1990   Current Outpatient Prescriptions on File Prior to Visit  Medication Sig Dispense Refill  . atorvastatin (LIPITOR) 80 MG tablet Take 80 mg by  mouth daily.     . diclofenac sodium (VOLTAREN) 1 % GEL Apply 2 g topically 4 (four) times daily. 4 Tube 2  . estrogen, conjugated,-medroxyprogesterone (PREMPRO) 0.3-1.5 MG tablet Take 1 tablet by mouth every other day. 45 tablet 1  . fluticasone (FLONASE) 50 MCG/ACT nasal spray PLACE 2 SPRAYS INTO BOTH NOSTRILS DAILY. 16 g 0  . gabapentin (NEURONTIN) 300 MG capsule TAKE 1  CAPSULE BY MOUTH 4 TIMES A DAY--AFTER MEALS AND AT BEDTIME 120 capsule 3  . hydrochlorothiazide (HYDRODIURIL) 25 MG tablet TAKE 1 TABLET (25 MG TOTAL) BY MOUTH DAILY. 30 tablet 0  . morphine (MSIR) 15 MG tablet Take 1 tablet (15 mg total) by mouth 2 (two) times daily as needed for severe pain. 60 tablet 0  . Morphine-Naltrexone (EMBEDA) 50-2 MG CPCR Take 1 capsule by mouth daily. 30 capsule 0  . omeprazole (PRILOSEC) 20 MG capsule Take 1 capsule (20 mg total) by mouth daily. 90 capsule 1  . potassium chloride SA (K-DUR,KLOR-CON) 20 MEQ tablet Take 1 tablet (20 mEq total) by mouth daily. 90 tablet 3  . tiZANidine (ZANAFLEX) 2 MG tablet TAKE 1 TABLET (2 MG TOTAL) BY MOUTH 2 (TWO) TIMES DAILY AS NEEDED FOR MUSCLE SPASMS. 60 tablet 1   No current facility-administered medications on file prior to visit.    Social History   Social History  . Marital status: Widowed    Spouse name: N/A  . Number of children: 3  . Years of education: N/A   Occupational History  . 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 Smoking status: Former Smoker    Packs/day: 0.50    Years: 20.00    Types: Cigarettes    Quit date: 07/21/2008  . Smokeless tobacco: Never Used  . Alcohol use No  . Drug use: No  . Sexual activity: Not on file   Other Topics Concern  . Not on file   Social History Narrative   Marital status: widowed since 2002; not dating which is bad in 2018      Children: 3 children (45, 28, 49); 13 grandchildren; 1 gg      Employment: disability since 2009; retail      Lives: alone in [place] in apartment      Tobacco: quit in 2011      Alcohol: none      Drugs: none      Exercise: minimal in 2018      ADLs: independent with ADLs; drives      Seatbelt: 100%; no texting.   Family History  Problem Relation Age of Onset  . Heart disease Mother 42       CHF; CAD  . Hypertension Mother   . Heart failure Mother   . Diabetes Sister   . Lupus Sister   . Kidney  failure Sister   . Hypertension Brother   . Hyperlipidemia Sister   . Hypertension Sister        Objective:    BP 132/72   Pulse 91   Temp 98 F (36.7 C) (Oral)   Resp 16   Ht 5' 4.96" (1.65 m)   Wt 219 lb (99.3 kg)   SpO2 95%   BMI 36.49 kg/m  Physical Exam  Constitutional: She is oriented to person, place, and time. She appears well-developed and well-nourished. No distress.  HENT:  Head: Normocephalic and atraumatic.  Right Ear: External ear normal.  Left Ear: External ear  normal.  Nose: Nose normal.  Mouth/Throat: Oropharynx is clear and moist.  Eyes: Pupils are equal, round, and reactive to light. Conjunctivae and EOM are normal.  Neck: Normal range of motion. Neck supple. Carotid bruit is not present. No thyromegaly present.  Cardiovascular: Normal rate, regular rhythm, normal heart sounds and intact distal pulses.  Exam reveals no gallop and no friction rub.   No murmur heard. Pulmonary/Chest: Effort normal and breath sounds normal. She has no wheezes. She has no rales.  Abdominal: Soft. Bowel sounds are normal. She exhibits no distension and no mass. There is no tenderness. There is no rebound and no guarding.  Lymphadenopathy:    She has no cervical adenopathy.  Neurological: She is alert and oriented to person, place, and time. No cranial nerve deficit.  Skin: Skin is warm and dry. No rash noted. She is not diaphoretic. No erythema. No pallor.  Psychiatric: She has a normal mood and affect. Her behavior is normal.   No results found. Depression screen [facility] 2/9 04/03/2017 02/23/2017 01/02/2017 10/25/2016 09/27/2016  Decreased Interest 0 0 0 0 0  Down, Depressed, Hopeless 0 0 0 0 0  PHQ - 2 Score 0 0 0 0 0  Altered sleeping - - - - -  Tired, decreased energy - - - - -  Change in appetite - - - - -  Feeling bad or failure about yourself  - - - - -  Trouble concentrating - - - - -  Moving slowly or fidgety/restless - - - - -  Suicidal thoughts - - - - -  PHQ-9 Score  - - - - -  Difficult doing work/chores - - - - -   Fall Risk  04/03/2017 03/20/2017 02/23/2017 01/24/2017 01/02/2017  Falls in the past year? No No No No No  Risk for fall due to : - - - - -        Assessment & Plan:   1. Pure hypercholesterolemia   2. Essential hypertension, benign   3. Nausea without vomiting   4. History of colonic polyps   5. Rectal pain   6. Renal insufficiency   7. Crohn's disease of small intestine with other complication ([place])   8. Postlaminectomy syndrome, lumbar region   9. Class 2 severe obesity due to excess calories with serious comorbidity and body mass index (BMI) of 36.0 to 36.9 in adult [facility])     -controlled hypertension and hypercholesterolemia; obtain labs for chronic disease management. -patient reporting chronic intermittent nausea with vomiting at nighttime; occurs once every 1-2 months on average; has been maintained on Phenergan bid per pain management; I refused to refill today; refer to GI.  Likely reflux related yet pt denies reflux symptoms; no improvement with daily Prilosec for six months; thus, advised to discontinue Prilosec. -suffering with hot flashes with Prempro every fourth day; increase to one every third day. -patient overdue for colonoscopy; also has dx of Crohn's disease on chart; not sure if nausea with vomiting related with Crohn's disease; reports associated abdominal cramping with vomiting.  Refer to GI.  -s/p lumbar surgery six months ago; minimal improvement in pain; managed by pain management. -pt also reporting intermittent rectal pain; refer to GI; asymptomatic today. -recommend weight loss, exercise for 30-60 minutes five days per week; recommend 1200 kcal restriction per day with a minimum of 60 grams of protein per day.      Orders Placed This Encounter  Procedures  . CBC with Differential/Platelet  .  Lipid panel  . Comprehensive metabolic panel  . Ambulatory referral to Gastroenterology    Referral Priority:    Routine    Referral Type:   Consultation    Referral Reason:   Specialty Services Required    Number of Visits Requested:   1  . POCT urinalysis dipstick   Meds ordered this encounter  Medications  . XTAMPZA ER 18 MG C12A    Sig: TAKE 1 CAPSULE BY MOUTH EVERY 12 HOURS    Refill:  0  . Oxycodone HCl 10 MG TABS    Sig: Take 10 mg by mouth every 6 (six) hours as needed. for pain    Refill:  0    Return in about 6 months (around 10/02/2017) for complete physical examiniation.   Abdallah Hern Elayne Guerin, M.D. Primary Care at [facility] previously [facility] [place] [address] [place], [place]  [address] [phone] phone [phone] fax

## 2017-04-04 ENCOUNTER — Encounter: Payer: Self-pay | Admitting: Family Medicine

## 2017-04-04 DIAGNOSIS — Z6836 Body mass index (BMI) 36.0-36.9, adult: Secondary | ICD-10-CM

## 2017-04-04 DIAGNOSIS — E669 Obesity, unspecified: Secondary | ICD-10-CM | POA: Insufficient documentation

## 2017-04-04 LAB — LIPID PANEL
CHOL/HDL RATIO: 3.8 ratio (ref 0.0–4.4)
Cholesterol, Total: 158 mg/dL (ref 100–199)
HDL: 42 mg/dL (ref >39)
LDL CALC: 93 mg/dL (ref 0–99)
Triglycerides: 116 mg/dL (ref 0–149)
VLDL CHOLESTEROL CAL: 23 mg/dL (ref 5–40)

## 2017-04-04 LAB — COMPREHENSIVE METABOLIC PANEL
ALK PHOS: 108 IU/L (ref 39–117)
ALT: 16 IU/L (ref 0–32)
AST: 25 IU/L (ref 0–40)
Albumin/Globulin Ratio: 1.4 (ref 1.2–2.2)
Albumin: 4.7 g/dL (ref 3.6–4.8)
BILIRUBIN TOTAL: 0.8 mg/dL (ref 0.0–1.2)
BUN / CREAT RATIO: 10 — AB (ref 12–28)
BUN: 10 mg/dL (ref 8–27)
CHLORIDE: 101 mmol/L (ref 96–106)
CO2: 23 mmol/L (ref 20–29)
Calcium: 10.4 mg/dL — ABNORMAL HIGH (ref 8.7–10.3)
Creatinine, Ser: 0.97 mg/dL (ref 0.57–1.00)
GFR calc Af Amer: 73 mL/min/{1.73_m2} (ref >59)
GFR calc non Af Amer: 64 mL/min/{1.73_m2} (ref >59)
GLUCOSE: 121 mg/dL — AB (ref 65–99)
Globulin, Total: 3.3 g/dL (ref 1.5–4.5)
Potassium: 4.1 mmol/L (ref 3.5–5.2)
Sodium: 143 mmol/L (ref 134–144)
Total Protein: 8 g/dL (ref 6.0–8.5)

## 2017-04-04 LAB — CBC WITH DIFFERENTIAL/PLATELET
BASOS: 0 %
Basophils Absolute: 0 10*3/uL (ref 0.0–0.2)
EOS (ABSOLUTE): 0.1 10*3/uL (ref 0.0–0.4)
Eos: 2 %
HEMOGLOBIN: 12.8 g/dL (ref 11.1–15.9)
Hematocrit: 39.7 % (ref 34.0–46.6)
IMMATURE GRANS (ABS): 0 10*3/uL (ref 0.0–0.1)
Immature Granulocytes: 0 %
LYMPHS ABS: 1.7 10*3/uL (ref 0.7–3.1)
LYMPHS: 40 %
MCH: 27.2 pg (ref 26.6–33.0)
MCHC: 32.2 g/dL (ref 31.5–35.7)
MCV: 84 fL (ref 79–97)
MONOCYTES: 7 %
Monocytes Absolute: 0.3 10*3/uL (ref 0.1–0.9)
NEUTROS ABS: 2.2 10*3/uL (ref 1.4–7.0)
Neutrophils: 51 %
Platelets: 304 10*3/uL (ref 150–379)
RBC: 4.71 x10E6/uL (ref 3.77–5.28)
RDW: 14.4 % (ref 12.3–15.4)
WBC: 4.3 10*3/uL (ref 3.4–10.8)

## 2017-04-07 ENCOUNTER — Telehealth: Payer: Self-pay | Admitting: Family Medicine

## 2017-04-07 NOTE — Telephone Encounter (Signed)
Pt called stating that she went and got her flu shot at her local [facility].

## 2017-04-12 ENCOUNTER — Other Ambulatory Visit: Payer: Self-pay | Admitting: Family Medicine

## 2017-04-12 DIAGNOSIS — Z79890 Hormone replacement therapy (postmenopausal): Secondary | ICD-10-CM

## 2017-04-20 ENCOUNTER — Ambulatory Visit (HOSPITAL_BASED_OUTPATIENT_CLINIC_OR_DEPARTMENT_OTHER): Payer: Medicare Other | Admitting: Physical Medicine & Rehabilitation

## 2017-04-20 ENCOUNTER — Encounter: Payer: Medicare Other | Attending: Physical Medicine & Rehabilitation

## 2017-04-20 ENCOUNTER — Encounter: Payer: Self-pay | Admitting: Physical Medicine & Rehabilitation

## 2017-04-20 VITALS — BP 143/72 | HR 56 | Resp 14

## 2017-04-20 DIAGNOSIS — M5414 Radiculopathy, thoracic region: Secondary | ICD-10-CM | POA: Insufficient documentation

## 2017-04-20 DIAGNOSIS — M961 Postlaminectomy syndrome, not elsewhere classified: Secondary | ICD-10-CM | POA: Diagnosis not present

## 2017-04-20 DIAGNOSIS — M5417 Radiculopathy, lumbosacral region: Secondary | ICD-10-CM | POA: Diagnosis not present

## 2017-04-20 MED ORDER — MORPHINE SULFATE 15 MG PO TABS: 15.0000 mg | ORAL_TABLET | Freq: Two times a day (BID) | ORAL | 0 refills | Status: DC | PRN

## 2017-04-20 MED ORDER — MORPHINE-NALTREXONE 50-2 MG PO CPCR: 1.0000 | ORAL_CAPSULE | Freq: Every day | ORAL | 0 refills | Status: DC

## 2017-04-20 NOTE — Patient Instructions (Signed)
Please have Dr Velna Ochs send me a note from next visit

## 2017-04-20 NOTE — Progress Notes (Signed)
Subjective:    Patient ID: [name] [name], female    DOB: 1956/01/24, 61 y.o.   MRN: [id number]  HPI Patient wishes to discuss her long-acting pain medications.  We reviewed the history of her long-acting pain medication including MS Contin 15 mg every 8 hours, patient was complaining of inadequate pain relief with 8/10 pain this was switched to Xtampza 18 mg extended release twice daily in November 2017, follow-up in December 2017 pain score at 8/10 Her short acting pain medication was also switched from MS IR 15 mg 3 times daily to oxycodone 10 mg twice daily  Morphine equivalents on the MS Contin and MSIR formulation was 90 mg/day On the abuse deterrent oxycodone Exar plus oxycodone it was morphine equivalent 90 mg/day  Patient also had some hardware failure and in February 2018 underwent surgery and her short acting oxycodone was increased to 4 times per day This regimen was continued until September 201 8, patient had pain score 5/10 but wished to try a different medication management treatment Embeda 50 mg/day plus MS IR 15 mg twice daily was prescribed  Patient has 6/10 pain today but she thinks that her preference is for the morphine rather than the oxycodone regimen, morphine equivalents currently are 75 mg/day Pain Inventory Average Pain 6 Pain Right Now 6 My pain is constant, stabbing and aching  In the last 24 hours, has pain interfered with the following? General activity 7 Relation with others 7 Enjoyment of life 7 What TIME of day is your pain at its worst? morning, evening, night Sleep (in general) Fair  Pain is worse with: walking, bending, sitting and standing Pain improves with: rest, heat/ice and medication Relief from Meds: 2  Mobility walk without assistance ability to climb steps?  yes do you drive?  yes Do you have any goals in this area?  yes  Function not employed: date last employed . disabled: date disabled . I need assistance with the  following:  meal prep, household duties and shopping Do you have any goals in this area?  yes  Neuro/Psych spasms  Prior Studies Any changes since last visit?  no  Physicians involved in your care Any changes since last visit?  no   Family History  Problem Relation Age of Onset  . Heart disease Mother 5       CHF; CAD  . Hypertension Mother   . Heart failure Mother   . Diabetes Sister   . Lupus Sister   . Kidney failure Sister   . Hypertension Brother   . Hyperlipidemia Sister   . Hypertension Sister    Social History   Social History  . Marital status: Widowed    Spouse name: N/A  . Number of children: 3  . Years of education: N/A   Occupational History  . 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 Smoking status: Former Smoker    Packs/day: 0.50    Years: 20.00    Types: Cigarettes    Quit date: 07/21/2008  . Smokeless tobacco: Never Used  . Alcohol use No  . Drug use: No  . Sexual activity: Not Asked   Other Topics Concern  . None   Social History Narrative   Marital status: widowed since 2002; not dating which is bad in 2018      Children: 3 children (45, 46, 48); 13 grandchildren; 1 gg      Employment: disability since 2009;  retail      Lives: alone in [place] in apartment      Tobacco: quit in 2011      Alcohol: none      Drugs: none      Exercise: minimal in 2018      ADLs: independent with ADLs; drives      Seatbelt: 100%; no texting.   Past Surgical History:  Procedure Laterality Date  . ABDOMINAL HYSTERECTOMY     DUB; ovaries intact  . BREAST BIOPSY    . CARDIAC CATHETERIZATION  1990's   [name]  . COLON SURGERY  05/11/11   [facility] L[facility] of adhesions Crohn's  . REDUCTION MAMMAPLASTY    . SPINE SURGERY     Past Medical History:  Diagnosis Date  . Crohn's disease ([place])   . Disorder of sacroiliac joint   . Hyperlipidemia   . Hypertension   . Lumbago   . Lumbar post-laminectomy syndrome   .  Lumbosacral neuritis   . Lumbosacral radiculitis   . Sciatica    BP (!) 143/72 (BP Location: Left Arm, Patient Position: Sitting, Cuff Size: Large)   Pulse (!) 56   Resp 14   SpO2 98%   Opioid Risk Score:   Fall Risk Score:  `1  Depression screen PHQ 2/9  Depression screen [facility] 2/9 04/03/2017 02/23/2017 01/02/2017 10/25/2016 09/27/2016 09/21/2016 05/10/2016  Decreased Interest 0 0 0 0 0 0 0  Down, Depressed, Hopeless 0 0 0 0 0 0 0  PHQ - 2 Score 0 0 0 0 0 0 0  Altered sleeping - - - - - - -  Tired, decreased energy - - - - - - -  Change in appetite - - - - - - -  Feeling bad or failure about yourself  - - - - - - -  Trouble concentrating - - - - - - -  Moving slowly or fidgety/restless - - - - - - -  Suicidal thoughts - - - - - - -  PHQ-9 Score - - - - - - -  Difficult doing work/chores - - - - - - -    Review of Systems  Constitutional: Negative.   HENT: Negative.   Eyes: Negative.   Respiratory: Negative.   Cardiovascular: Negative.   Gastrointestinal: Negative.   Endocrine: Negative.   Genitourinary: Negative.   Musculoskeletal: Positive for back pain.       Spasms  Skin: Negative.   Allergic/Immunologic: Negative.   Neurological: Negative.   Hematological: Negative.   Psychiatric/Behavioral: Negative.        Objective:   Physical Exam  Constitutional: She appears well-developed and well-nourished. No distress.  HENT:  Head: Normocephalic and atraumatic.  Eyes: Pupils are equal, round, and reactive to light. Conjunctivae and EOM are normal.  Neck: Normal range of motion.  Skin: She is not diaphoretic.  Psychiatric: She has a normal mood and affect. Her speech is normal and behavior is normal. Thought content normal. Cognition and memory are normal.  Exaggerated pain behaviors interspersed smiling  Nursing note and vitals reviewed.  No pain with forward flexion, range of motion is approximately 50% but this is mainly from the hips. Lumbar extension is to  neutral Negative straight leg raising Lower extremity strength is normal Pain is in sacroiliac area mild tenderness over this area      Assessment & Plan:  1.  Lumbar post laminectomy syndrome with chronic pain.  She has had several lumbar fusions with  revisions levels L1 through S1 Patient has follow-up with her orthopedic surgeon this week  Over half of the 25 min visit was spent counseling and coordinating care.  We discussed the following information Opioid medications are helpful for acute pain . Opioids medications have a modest effect on chronic pain. Up to 50% of patients do not tolerate the side effects of constipation, drowsiness, depressive effects. The average benefit for pain reduction is only 10-30%.  Intermittent urine drug screens are needed.  Last UDS 11/23/2016 was appropriate would repeat next month  Reviewed PMP aware data

## 2017-04-27 DIAGNOSIS — M960 Pseudarthrosis after fusion or arthrodesis: Secondary | ICD-10-CM | POA: Diagnosis not present

## 2017-04-27 DIAGNOSIS — I10 Essential (primary) hypertension: Secondary | ICD-10-CM | POA: Diagnosis not present

## 2017-05-01 DIAGNOSIS — Z981 Arthrodesis status: Secondary | ICD-10-CM | POA: Diagnosis not present

## 2017-05-01 DIAGNOSIS — M5136 Other intervertebral disc degeneration, lumbar region: Secondary | ICD-10-CM | POA: Diagnosis not present

## 2017-05-05 ENCOUNTER — Ambulatory Visit: Payer: Medicare Other | Admitting: Physical Medicine & Rehabilitation

## 2017-05-09 ENCOUNTER — Other Ambulatory Visit: Payer: Self-pay | Admitting: Registered Nurse

## 2017-05-18 ENCOUNTER — Encounter (HOSPITAL_BASED_OUTPATIENT_CLINIC_OR_DEPARTMENT_OTHER): Payer: Medicare Other | Admitting: Registered Nurse

## 2017-05-18 ENCOUNTER — Encounter: Payer: Self-pay | Admitting: Registered Nurse

## 2017-05-18 VITALS — BP 154/86 | HR 62

## 2017-05-18 DIAGNOSIS — G894 Chronic pain syndrome: Secondary | ICD-10-CM

## 2017-05-18 DIAGNOSIS — M961 Postlaminectomy syndrome, not elsewhere classified: Secondary | ICD-10-CM

## 2017-05-18 DIAGNOSIS — Z79899 Other long term (current) drug therapy: Secondary | ICD-10-CM | POA: Diagnosis not present

## 2017-05-18 DIAGNOSIS — Z5181 Encounter for therapeutic drug level monitoring: Secondary | ICD-10-CM

## 2017-05-18 DIAGNOSIS — M48061 Spinal stenosis, lumbar region without neurogenic claudication: Secondary | ICD-10-CM

## 2017-05-18 DIAGNOSIS — M5414 Radiculopathy, thoracic region: Secondary | ICD-10-CM | POA: Diagnosis not present

## 2017-05-18 DIAGNOSIS — M62838 Other muscle spasm: Secondary | ICD-10-CM

## 2017-05-18 DIAGNOSIS — M5417 Radiculopathy, lumbosacral region: Secondary | ICD-10-CM | POA: Diagnosis not present

## 2017-05-18 MED ORDER — MORPHINE SULFATE 15 MG PO TABS: 15.0000 mg | ORAL_TABLET | Freq: Two times a day (BID) | ORAL | 0 refills | Status: DC | PRN

## 2017-05-18 MED ORDER — MORPHINE-NALTREXONE 50-2 MG PO CPCR: 1.0000 | ORAL_CAPSULE | Freq: Every day | ORAL | 0 refills | Status: DC

## 2017-05-18 NOTE — Progress Notes (Signed)
Subjective:    Patient ID: [name] [name], female    DOB: [birth year], 61 y.o.   MRN: [id number]  HPI: Ms. Eisley Barber is a 61year old female who returns for follow up appointmentfor chronic pain and medication refill.She states her pain is located in her lower back. .She rates her pain 8.Her current exercise regime is walking and performing stretching exercises.    Ms. Michalski Morphine equivalent is 80.00 MME.     Her last UDS was 11/23/2016 it was consistent.   She underwent uncomplicated Bilateral re-exploration Lumbar Decompression L-2-3, L3-L4, L-4- 5L5-S1 metal removal (expedium) L2-3, L5-S1 Fusion exploration L4-5, L5-S1 Repair Non-Union with Re instrumentation Left iliac crest bone graftwith Dr. Velna Ochs on 07/22/2016.   Pain Inventory Average Pain 8 Pain Right Now 8 My pain is constant, stabbing and aching  In the last 24 hours, has pain interfered with the following? General activity 8 Relation with others 8 Enjoyment of life 8 What TIME of day is your pain at its worst? morning, evening, night Sleep (in general) Fair  Pain is worse with: walking, inactivity and standing Pain improves with: rest, heat/ice and medication Relief from Meds: 3  Mobility walk without assistance ability to climb steps?  yes do you drive?  yes Do you have any goals in this area?  yes  Function disabled: date disabled 10/2007 I need assistance with the following:  household duties and shopping Do you have any goals in this area?  yes  Neuro/Psych spasms  Prior Studies Any changes since last visit?  no  Physicians involved in your care Any changes since last visit?  no   Family History  Problem Relation Age of Onset  . Heart disease Mother 28       CHF; CAD  . Hypertension Mother   . Heart failure Mother   . Diabetes Sister   . Lupus Sister   . Kidney failure Sister   . Hypertension Brother   . Hyperlipidemia Sister   . Hypertension Sister    Social  History   Socioeconomic History  . Marital status: Widowed    Spouse name: Not on file  . Number of children: 3  . Years of education: Not on file  . Highest education level: Not on file  Social Needs  . Financial resource strain: Not on file  . Food insecurity - worry: Not on file  . Food insecurity - inability: Not on file  . Transportation needs - medical: Not on file  . Transportation needs - non-medical: Not on file  Occupational History  . 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 Smoking status: Former Smoker    Packs/day: 0.50    Years: 20.00    Pack years: 10.00    Types: Cigarettes    Last attempt to quit: 07/21/2008    Years since quitting: 8.8  . Smokeless tobacco: Never Used  Substance and Sexual Activity  . Alcohol use: No  . Drug use: No  . Sexual activity: Not on file  Other Topics Concern  . Not on file  Social History Narrative   Marital status: widowed since 2002; not dating which is bad in 2018      Children: 3 children (45, 16, 65); 13 grandchildren; 1 gg      Employment: disability since 2009; retail      Lives: alone in [place] in apartment      Tobacco: quit in 2011  Alcohol: none      Drugs: none      Exercise: minimal in 2018      ADLs: independent with ADLs; drives      Seatbelt: 100%; no texting.   Past Surgical History:  Procedure Laterality Date  . ABDOMINAL HYSTERECTOMY     DUB; ovaries intact  . BREAST BIOPSY    . CARDIAC CATHETERIZATION  1990's   [name]  . COLON SURGERY  05/11/11   [facility] L[facility] of adhesions Crohn's  . REDUCTION MAMMAPLASTY    . SPINE SURGERY     Past Medical History:  Diagnosis Date  . Crohn's disease ([place])   . Disorder of sacroiliac joint   . Hyperlipidemia   . Hypertension   . Lumbago   . Lumbar post-laminectomy syndrome   . Lumbosacral neuritis   . Lumbosacral radiculitis   . Sciatica    There were no vitals taken for this visit.  Opioid Risk Score:  0 Fall  Risk Score:  `1  Depression screen PHQ 2/9  Depression screen [facility] 2/9 04/03/2017 02/23/2017 01/02/2017 10/25/2016 09/27/2016 09/21/2016 05/10/2016  Decreased Interest 0 0 0 0 0 0 0  Down, Depressed, Hopeless 0 0 0 0 0 0 0  PHQ - 2 Score 0 0 0 0 0 0 0  Altered sleeping - - - - - - -  Tired, decreased energy - - - - - - -  Change in appetite - - - - - - -  Feeling bad or failure about yourself  - - - - - - -  Trouble concentrating - - - - - - -  Moving slowly or fidgety/restless - - - - - - -  Suicidal thoughts - - - - - - -  PHQ-9 Score - - - - - - -  Difficult doing work/chores - - - - - - -   Review of Systems  Constitutional: Negative.   HENT: Negative.   Eyes: Negative.   Respiratory: Negative.   Cardiovascular: Negative.   Gastrointestinal: Negative.   Endocrine: Negative.   Genitourinary: Negative.   Musculoskeletal: Positive for back pain, gait problem, myalgias and neck pain.       Spasms  Skin: Negative.   Allergic/Immunologic: Negative.   Hematological: Negative.   Psychiatric/Behavioral: Negative.   All other systems reviewed and are negative.      Objective:   Physical Exam  Constitutional: She is oriented to person, place, and time. She appears well-developed and well-nourished.  HENT:  Head: Normocephalic and atraumatic.  Neck: Normal range of motion. Neck supple.  Cardiovascular: Normal rate and regular rhythm.  Pulmonary/Chest: Effort normal and breath sounds normal.  Musculoskeletal:  Normal Muscle Bulk and Muscle Testing Reveals: Upper Extremities: Full ROM and Muscle Strength 5/5 Lumbar Paraspinal Tenderness: L-3-L-5 Lower Extremities: Full ROM and Muscle Strength 5/5 Arises from Table Slowly, using straight cane for support Narrow Based Gait   Neurological: She is alert and oriented to person, place, and time.  Skin: Skin is warm and dry.  Psychiatric: She has a normal mood and affect.  Nursing note and vitals reviewed.         Assessment &  Plan:  1.Lumbar Postlaminectomy/ Spinal Stenosis Lumbar Region/ Post-Op Pain/Low back pain with radiating symptoms in L3-4 distribution: continues to be limited by pain. Refilled: Embeda 50 mg  One capsule daily and MSIR 15 mg one tablet twice a day as needed for moderate to sever pain. 05/18/2017 . We will continue the  opioid monitoring program, this consists of regular clinic visits, examinations, urine drug screen, pill counts as well as use of [place] Controlled Substance Reporting System. Encouraged to Continue exercise regime.  2. Lumbar Radiculopathy: No complaints today.Continue Gabapentin. 05/18/2017 3. Chronic Right Knee Pain:No complaints today. Continue with Ice Therapy, Voltaren Gel. 05/18/2017 4. Muscle Spasm: Continue: Tizanidine. 11//29/2018  30 minutes of face to face patient care time was spent during this visit. All questions were encouraged and answered.  F/U in 1 month

## 2017-05-23 ENCOUNTER — Ambulatory Visit (INDEPENDENT_AMBULATORY_CARE_PROVIDER_SITE_OTHER): Payer: Medicare Other | Admitting: Gastroenterology

## 2017-05-23 ENCOUNTER — Encounter: Payer: Self-pay | Admitting: Gastroenterology

## 2017-05-23 VITALS — BP 150/80 | HR 72 | Ht 65.0 in | Wt 213.6 lb

## 2017-05-23 DIAGNOSIS — R1084 Generalized abdominal pain: Secondary | ICD-10-CM

## 2017-05-23 DIAGNOSIS — R110 Nausea: Secondary | ICD-10-CM | POA: Diagnosis not present

## 2017-05-23 DIAGNOSIS — K6289 Other specified diseases of anus and rectum: Secondary | ICD-10-CM | POA: Diagnosis not present

## 2017-05-23 MED ORDER — PROMETHAZINE HCL 25 MG PO TABS: 25.0000 mg | ORAL_TABLET | Freq: Three times a day (TID) | ORAL | 2 refills | Status: DC | PRN

## 2017-05-23 NOTE — Patient Instructions (Signed)
If you are age 60 or older, your body mass index should be between 23-30. Your Body mass index is 35.54 kg/m. If this is out of the aforementioned range listed, please consider follow up with your Primary Care Provider.  If you are age 10 or younger, your body mass index should be between 19-25. Your Body mass index is 35.54 kg/m. If this is out of the aformentioned range listed, please consider follow up with your Primary Care Provider.   You have been scheduled for an endoscopy and colonoscopy. Please follow the written instructions given to you at your visit today. Please pick up your prep supplies at the pharmacy within the next 1-3 days. If you use inhalers (even only as needed), please bring them with you on the day of your procedure. Your physician has requested that you go to www.startemmi.com and enter the access code given to you at your visit today. This web site gives a general overview about your procedure. However, you should still follow specific instructions given to you by our office regarding your preparation for the procedure.  Thank you for choosing [facility]  Dr Wilfrid Lund III

## 2017-05-23 NOTE — Progress Notes (Signed)
[facility] Consult Note:  History: [name] [name] 05/23/2017  Referring physician: Wardell Honour, MD  Reason for consult/chief complaint: Nausea (Without vomiting); Rectal Pain (?,, maybe hemorrhoids); and Colon Cancer Screening (Hx of polyps)   Subjective  HPI:  This is a 61 year old woman referred by primary care for a constellation of GI symptoms.  She also has a somewhat unclear history of "precancerous" findings on prior colonoscopy at an outside institution. She has many years of chronic lower back pain and has had multiple surgeries for that, most recently earlier this year.  She has a chronic pain syndrome requiring opioid therapy and is disabled from this condition.  She describes chronic nausea without vomiting.  She tends toward bloating, in the last year has had intermittent sharp lower abdominal or rectal pain.  Her bowel habits are about every other day, but often needs to strain.  She denies rectal bleeding, appetite is been good and weight stable.  She denies dysphagia, odynophagia or early satiety. Kida describes a diagnosis of IBS in the 1980s.  She also was diagnosed with Crohn's disease in the 1990s and received a couple doses of Remicade by her report.  We have very limited records available from the GI clinic at [facility], but we do have is outlined below.   Primary care note from November 2017 indicates "colonoscopy 2 years ago, removed 3 high risk polyps, was to repeat colonoscopy one year later.  Last colonoscopy 01/02/13" I can see in care everywhere and encounter at [facility] July 2014 for colonoscopy by Dr. Gus Puma.  The colonoscopy report cannot be opened, but there are normal right and left colon biopsy reports. Biopsies from a colonoscopy in June 2012 show "chronic colitis, histologically minimal/mild, negative for dysplasia".  A hyperplastic polyp was removed from the stomach as well. In 2011 colonoscopy pathology report  revealed a tubular adenoma, and a random colon biopsy shows colonic mucosa with low-grade dysplasia.  No active inflammation, architectural distortion or granuloma was seen. Colonoscopy report from November 2002 by Dr.Jane Onken was done for established Crohn's disease.  It indicates a normal ileum and "a single scar found at the splenic flexure".  In short, no active Crohn's disease was seen.  ROS:  Review of Systems  Constitutional: Positive for fatigue. Negative for appetite change and unexpected weight change.  HENT: Negative for mouth sores and voice change.   Eyes: Negative for pain and redness.  Respiratory: Negative for cough and shortness of breath.   Cardiovascular: Negative for chest pain and palpitations.  Genitourinary: Negative for dysuria and hematuria.  Musculoskeletal: Positive for back pain. Negative for arthralgias and myalgias.  Skin: Negative for pallor and rash.  Neurological: Negative for weakness and headaches.  Hematological: Negative for adenopathy.     Past Medical History: Past Medical History:  Diagnosis Date  . Crohn's disease ([place])   . Disorder of sacroiliac joint   . Hyperlipidemia   . Hypertension   . Ischemic colitis ([place])   . Lumbago   . Lumbar post-laminectomy syndrome   . Lumbosacral neuritis   . Lumbosacral radiculitis   . Sciatica      Past Surgical History: Past Surgical History:  Procedure Laterality Date  . ABDOMINAL HYSTERECTOMY     DUB; ovaries intact  . BREAST BIOPSY    . CARDIAC CATHETERIZATION  1990's   [name]  . COLON SURGERY  05/11/11   [facility] of adhesions Crohn's  . REDUCTION MAMMAPLASTY    .  SPINE SURGERY       Family History: Family History  Problem Relation Age of Onset  . Heart disease Mother 69       CHF; CAD  . Hypertension Mother   . Heart failure Mother   . Diabetes Sister   . Lupus Sister   . Kidney failure Sister   . Hypertension Brother   . Hyperlipidemia Sister   . Hypertension Sister      Social History: Social History   Socioeconomic History  . Marital status: Widowed    Spouse name: None  . Number of children: 3  . Years of education: None  . Highest education level: None  Social Needs  . Financial resource strain: None  . Food insecurity - worry: None  . Food insecurity - inability: None  . Transportation needs - medical: None  . Transportation needs - non-medical: None  Occupational History  . 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 Smoking status: Former Smoker    Packs/day: 0.50    Years: 20.00    Pack years: 10.00    Types: Cigarettes    Last attempt to quit: 07/21/2008    Years since quitting: 8.8  . Smokeless tobacco: Never Used  Substance and Sexual Activity  . Alcohol use: No  . Drug use: No  . Sexual activity: None  Other Topics Concern  . None  Social History Narrative   Marital status: widowed since 2002; not dating which is bad in 2018      Children: 3 children (45, 96, 46); 13 grandchildren; 1 gg      Employment: disability since 2009; retail      Lives: alone in [place] in apartment      Tobacco: quit in 2011      Alcohol: none      Drugs: none      Exercise: minimal in 2018      ADLs: independent with ADLs; drives      Seatbelt: 100%; no texting.    Allergies: Allergies  Allergen Reactions  . Orudis [Ketoprofen] Hives, Itching and Rash    onSet: 07/30/1990    Outpatient Meds: Current Outpatient Medications  Medication Sig Dispense Refill  . diclofenac sodium (VOLTAREN) 1 % GEL Apply 2 g topically 4 (four) times daily. 4 Tube 2  . fluticasone (FLONASE) 50 MCG/ACT nasal spray PLACE 2 SPRAYS INTO BOTH NOSTRILS DAILY. 16 g 0  . gabapentin (NEURONTIN) 300 MG capsule TAKE 1 CAPSULE BY MOUTH 4 TIMES A DAY--AFTER MEALS AND AT BEDTIME 120 capsule 3  . hydrochlorothiazide (HYDRODIURIL) 25 MG tablet TAKE 1 TABLET (25 MG TOTAL) BY MOUTH DAILY. 30 tablet 0  . morphine (MSIR) 15 MG tablet Take 1  tablet (15 mg total) by mouth 2 (two) times daily as needed for severe pain. 60 tablet 0  . Morphine-Naltrexone (EMBEDA) 50-2 MG CPCR Take 1 capsule by mouth daily. 30 capsule 0  . PREMPRO 0.3-1.5 MG tablet TAKE 1 TABLET BY MOUTH EVERY OTHER DAY. 28 tablet 1  . tiZANidine (ZANAFLEX) 2 MG tablet TAKE 1 TABLET (2 MG TOTAL) BY MOUTH 2 (TWO) TIMES DAILY AS NEEDED FOR MUSCLE SPASMS. 60 tablet 1  . promethazine (PHENERGAN) 25 MG tablet Take 1 tablet (25 mg total) by mouth every 8 (eight) hours as needed for nausea or vomiting. 45 tablet 2   No current facility-administered medications for this visit.       ___________________________________________________________________ Objective   Exam:  BP (!) 150/80   Pulse 72   Ht 5\' 5"  (1.651 m)   Wt 213 lb 9.6 oz (96.9 kg)   BMI 35.54 kg/m    General: this is a(n) well-appearing woman with an antalgic gait.  Eyes: sclera anicteric, no redness  ENT: oral mucosa moist without lesions, no cervical or supraclavicular lymphadenopathy, good dentition  CV: RRR without murmur, S1/S2, no JVD, no peripheral edema  Resp: clear to auscultation bilaterally, normal RR and effort noted  GI: soft, mild epigastric and midline lower tenderness, with active bowel sounds. No guarding or palpable organomegaly noted.  Skin; warm and dry, no rash or jaundice noted  Neuro: awake, alert and oriented x 3. Normal gross motor function and fluent speech  Labs:  CBC Latest Ref Rng & Units 04/03/2017 09/21/2016 05/10/2016  WBC 3.4 - 10.8 x10E3/uL 4.3 4.7 5.2  Hemoglobin 11.1 - 15.9 g/dL 12.8 11.7 12.5  Hematocrit 34.0 - 46.6 % 39.7 37.6 39.6  Platelets 150 - 379 x10E3/uL 304 250 316   CMP Latest Ref Rng & Units 04/03/2017 09/21/2016 05/10/2016  Glucose 65 - 99 mg/dL 121(H) 108(H) 88  BUN 8 - 27 mg/dL 10 8 9   Creatinine 0.57 - 1.00 mg/dL 0.97 1.13(H) 0.99  Sodium 134 - 144 mmol/L 143 139 141  Potassium 3.5 - 5.2 mmol/L 4.1 4.4 4.3  Chloride 96 - 106 mmol/L 101  99 103  CO2 20 - 29 mmol/L 23 25 30   Calcium 8.7 - 10.3 mg/dL 10.4(H) 9.3 9.7  Total Protein 6.0 - 8.5 g/dL 8.0 7.6 7.5  Total Bilirubin 0.0 - 1.2 mg/dL 0.8 0.4 0.9  Alkaline Phos 39 - 117 IU/L 108 96 66  AST 0 - 40 IU/L 25 29 26   ALT 0 - 32 IU/L 16 24 18     Assessment: Encounter Diagnoses  Name Primary?  . Generalized abdominal pain Yes  . Rectal pain   . Nausea without vomiting     Nausea is somewhat difficult to characterize, may be side effects of medicines especially opioids.  She denies vomiting, early satiety or weight loss. Her abdominal and rectal pain are also somewhat difficult to characterize.  Despite her reported being regular, I suspect perhaps she has some opioid related constipation. We do not yet have enough information to completely understand this reported history of IBD, but none was seen on a colonoscopy in 2002.  We need to get the actual colonoscopy reports from 2011, 2012 and 2014.  The finding of dysplasia in 2014 appears to have been on a "random colon biopsy".  Plan:  Obtain colonoscopy reports from [place] [facility] and colonoscopy.  Patient agreeable after a thorough discussion of procedure and risks.  The benefits and risks of the planned procedure were described in detail with the patient or (when appropriate) their health care proxy.  Risks were outlined as including, but not limited to, bleeding, infection, perforation, adverse medication reaction leading to cardiac or pulmonary decompensation, or pancreatitis (if ERCP).  The limitation of incomplete mucosal visualization was also discussed.  No guarantees or warranties were given.   I prescribed her Phenergan, as she says it works well for her nausea, better than Zofran worked for her.  Thank you for the courtesy of this consult.  Please call me with any questions or concerns.  Nelida Meuse III  CC: Wardell Honour, MD

## 2017-06-15 ENCOUNTER — Encounter: Payer: Medicare Other | Admitting: Registered Nurse

## 2017-06-21 ENCOUNTER — Encounter: Payer: Medicare Other | Attending: Physical Medicine & Rehabilitation | Admitting: Registered Nurse

## 2017-06-21 ENCOUNTER — Encounter: Payer: Self-pay | Admitting: Registered Nurse

## 2017-06-21 VITALS — BP 139/85 | HR 68

## 2017-06-21 DIAGNOSIS — Z5181 Encounter for therapeutic drug level monitoring: Secondary | ICD-10-CM

## 2017-06-21 DIAGNOSIS — M62838 Other muscle spasm: Secondary | ICD-10-CM | POA: Diagnosis not present

## 2017-06-21 DIAGNOSIS — M961 Postlaminectomy syndrome, not elsewhere classified: Secondary | ICD-10-CM | POA: Diagnosis not present

## 2017-06-21 DIAGNOSIS — G894 Chronic pain syndrome: Secondary | ICD-10-CM | POA: Diagnosis not present

## 2017-06-21 DIAGNOSIS — M5414 Radiculopathy, thoracic region: Secondary | ICD-10-CM | POA: Diagnosis not present

## 2017-06-21 DIAGNOSIS — Z79899 Other long term (current) drug therapy: Secondary | ICD-10-CM | POA: Diagnosis not present

## 2017-06-21 DIAGNOSIS — M5417 Radiculopathy, lumbosacral region: Secondary | ICD-10-CM | POA: Diagnosis not present

## 2017-06-21 DIAGNOSIS — M48061 Spinal stenosis, lumbar region without neurogenic claudication: Secondary | ICD-10-CM

## 2017-06-21 MED ORDER — MORPHINE-NALTREXONE 50-2 MG PO CPCR: 1.0000 | ORAL_CAPSULE | Freq: Every day | ORAL | 0 refills | Status: DC

## 2017-06-21 MED ORDER — MORPHINE SULFATE 15 MG PO TABS: 15.0000 mg | ORAL_TABLET | Freq: Two times a day (BID) | ORAL | 0 refills | Status: DC | PRN

## 2017-06-21 NOTE — Progress Notes (Signed)
Subjective:    Patient ID: [name] [name], female    DOB: 28-Oct-1955, 62 y.o.   MRN: [id number]  HPI: [name] [name] is a 62year old female who returns for follow up appointmentfor chronic pain and medication refill.She states her pain is located in her lower back (mainly left side). .She rates her pain 8.Her current exercise regime is walking and performing stretching exercises.   [name] [name] Morphine equivalent is 80.00 MME.    Her last UDS was 11/23/2016 it was consistent.   She underwent uncomplicated Bilateral re-exploration Lumbar Decompression L-2-3, L3-L4, L-4- 5L5-S1 metal removal (expedium) L2-3, L5-S1 Fusion exploration L4-5, L5-S1 Repair Non-Union with Re instrumentation Left iliac crest bone graftwith Dr. Velna Ochs on 07/22/2016.   Pain Inventory Average Pain 7 Pain Right Now 8 My pain is constant, stabbing and aching  In the last 24 hours, has pain interfered with the following? General activity 8 Relation with others 9 Enjoyment of life 8 What TIME of day is your pain at its worst? morning, evening, night Sleep (in general) Fair  Pain is worse with: walking, inactivity and standing Pain improves with: rest, heat/ice and medication Relief from Meds: 1  Mobility walk without assistance ability to climb steps?  yes do you drive?  yes Do you have any goals in this area?  yes  Function disabled: date disabled 10/2007 I need assistance with the following:  household duties and shopping Do you have any goals in this area?  yes  Neuro/Psych spasms  Prior Studies Any changes since last visit?  no  Physicians involved in your care Any changes since last visit?  no   Family History  Problem Relation Age of Onset  . Heart disease Mother 25       CHF; CAD  . Hypertension Mother   . Heart failure Mother   . Diabetes Sister   . Lupus Sister   . Kidney failure Sister   . Hypertension Brother   . Hyperlipidemia Sister   . Hypertension Sister     Social History   Socioeconomic History  . Marital status: Widowed    Spouse name: Not on file  . Number of children: 3  . Years of education: Not on file  . Highest education level: Not on file  Social Needs  . Financial resource strain: Not on file  . Food insecurity - worry: Not on file  . Food insecurity - inability: Not on file  . Transportation needs - medical: Not on file  . Transportation needs - non-medical: Not on file  Occupational History  . 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 Smoking status: Former Smoker    Packs/day: 0.50    Years: 20.00    Pack years: 10.00    Types: Cigarettes    Last attempt to quit: 07/21/2008    Years since quitting: 8.9  . Smokeless tobacco: Never Used  Substance and Sexual Activity  . Alcohol use: No  . Drug use: No  . Sexual activity: Not on file  Other Topics Concern  . Not on file  Social History Narrative   Marital status: widowed since 2002; not dating which is bad in 2018      Children: 3 children (45, 18, 15); 13 grandchildren; 1 gg      Employment: disability since 2009; retail      Lives: alone in [place] in apartment      Tobacco: quit in 2011  Alcohol: none      Drugs: none      Exercise: minimal in 2018      ADLs: independent with ADLs; drives      Seatbelt: 100%; no texting.   Past Surgical History:  Procedure Laterality Date  . ABDOMINAL HYSTERECTOMY     DUB; ovaries intact  . BREAST BIOPSY    . CARDIAC CATHETERIZATION  1990's   [name]  . COLON SURGERY  05/11/11   [facility] of adhesions Crohn's  . REDUCTION MAMMAPLASTY    . SPINE SURGERY     Past Medical History:  Diagnosis Date  . Crohn's disease ([place])   . Disorder of sacroiliac joint   . Hyperlipidemia   . Hypertension   . Ischemic colitis ([place])   . Lumbago   . Lumbar post-laminectomy syndrome   . Lumbosacral neuritis   . Lumbosacral radiculitis   . Sciatica    There were no vitals taken for this  visit.  Opioid Risk Score:  0 Fall Risk Score:  `1  Depression screen PHQ 2/9  Depression screen [facility] 2/9 04/03/2017 02/23/2017 01/02/2017 10/25/2016 09/27/2016 09/21/2016 05/10/2016  Decreased Interest 0 0 0 0 0 0 0  Down, Depressed, Hopeless 0 0 0 0 0 0 0  PHQ - 2 Score 0 0 0 0 0 0 0  Altered sleeping - - - - - - -  Tired, decreased energy - - - - - - -  Change in appetite - - - - - - -  Feeling bad or failure about yourself  - - - - - - -  Trouble concentrating - - - - - - -  Moving slowly or fidgety/restless - - - - - - -  Suicidal thoughts - - - - - - -  PHQ-9 Score - - - - - - -  Difficult doing work/chores - - - - - - -   Review of Systems  Constitutional: Positive for diaphoresis and unexpected weight change.  HENT: Negative.   Eyes: Negative.   Respiratory: Negative.   Cardiovascular: Negative.   Gastrointestinal: Negative.   Endocrine: Negative.   Genitourinary: Negative.   Musculoskeletal: Positive for back pain, gait problem, myalgias and neck pain.       Spasms  Skin: Negative.   Allergic/Immunologic: Negative.   Hematological: Negative.   Psychiatric/Behavioral: Negative.   All other systems reviewed and are negative.      Objective:   Physical Exam  Constitutional: She is oriented to person, place, and time. She appears well-developed and well-nourished.  HENT:  Head: Normocephalic and atraumatic.  Neck: Normal range of motion. Neck supple.  Cardiovascular: Normal rate and regular rhythm.  Pulmonary/Chest: Effort normal and breath sounds normal.  Musculoskeletal:  Normal Muscle Bulk and Muscle Testing Reveals: Upper Extremities: Full ROM and Muscle Strength 5/5 Lumbar Paraspinal Tenderness: L-3-L-5 Mainly Left Side Lower Extremities: Full ROM and Muscle Strength 5/5 Arises from Table Slowly, using straight cane for support Narrow Based Gait   Neurological: She is alert and oriented to person, place, and time.  Skin: Skin is warm and dry.  Psychiatric:  She has a normal mood and affect.  Nursing note and vitals reviewed.         Assessment & Plan:  1.Lumbar Postlaminectomy/ Spinal Stenosis Lumbar Region/ Post-Op Pain/Low back pain with radiating symptoms in L3-4 distribution: continues to be limited by pain. Refilled: Embeda 50 mg One capsule daily and MSIR 15 mg one tablet twice a day  as needed for moderate to sever pain. 06/21/2017. . We will continue the opioid monitoring program, this consists of regular clinic visits, examinations, urine drug screen, pill counts as well as use of [place] Controlled Substance Reporting System. Encouraged to Continue exercise regime.  2. Lumbar Radiculopathy: No complaints today.Continue to Monitor 06/21/2017 3. Chronic Right Knee Pain:No complaints today. Continue with Ice Therapy, Voltaren Gel. 06/21/2017 4. Muscle Spasm: Continue: Tizanidine. 01//07/2017  30 minutes of face to face patient care time was spent during this visit. All questions were encouraged and answered.  F/U in 1 month

## 2017-06-26 ENCOUNTER — Encounter: Payer: Self-pay | Admitting: Gastroenterology

## 2017-06-26 ENCOUNTER — Other Ambulatory Visit: Payer: Self-pay

## 2017-06-26 ENCOUNTER — Ambulatory Visit (AMBULATORY_SURGERY_CENTER): Payer: Medicare Other | Admitting: Gastroenterology

## 2017-06-26 VITALS — BP 171/90 | HR 78 | Temp 97.5°F | Resp 9 | Ht 65.0 in | Wt 213.0 lb

## 2017-06-26 DIAGNOSIS — R110 Nausea: Secondary | ICD-10-CM

## 2017-06-26 DIAGNOSIS — R1084 Generalized abdominal pain: Secondary | ICD-10-CM | POA: Diagnosis present

## 2017-06-26 DIAGNOSIS — K6289 Other specified diseases of anus and rectum: Secondary | ICD-10-CM | POA: Diagnosis not present

## 2017-06-26 MED ORDER — SODIUM CHLORIDE 0.9 % IV SOLN: 500.0000 mL | Freq: Once | INTRAVENOUS | Status: DC

## 2017-06-26 NOTE — Patient Instructions (Signed)
YOU HAD AN ENDOSCOPIC PROCEDURE TODAY AT [facility]:   Refer to the procedure report that was given to you for any specific questions about what was found during the examination.  If the procedure report does not answer your questions, please call your gastroenterologist to clarify.  If you requested that your care partner not be given the details of your procedure findings, then the procedure report has been included in a sealed envelope for you to review at your convenience later.  YOU SHOULD EXPECT: Some feelings of bloating in the abdomen. Passage of more gas than usual.  Walking can help get rid of the air that was put into your GI tract during the procedure and reduce the bloating. If you had a lower endoscopy (such as a colonoscopy or flexible sigmoidoscopy) you may notice spotting of blood in your stool or on the toilet paper. If you underwent a bowel prep for your procedure, you may not have a normal bowel movement for a few days.  Please Note:  You might notice some irritation and congestion in your nose or some drainage.  This is from the oxygen used during your procedure.  There is no need for concern and it should clear up in a day or so.  SYMPTOMS TO REPORT IMMEDIATELY:   Following lower endoscopy (colonoscopy or flexible sigmoidoscopy):  Excessive amounts of blood in the stool  Significant tenderness or worsening of abdominal pains  Swelling of the abdomen that is new, acute  Fever of 100F or higher   Following upper endoscopy (EGD)  Vomiting of blood or coffee ground material  New chest pain or pain under the shoulder blades  Painful or persistently difficult swallowing  New shortness of breath  Fever of 100F or higher  Black, tarry-looking stools  For urgent or emergent issues, a gastroenterologist can be reached at any hour by calling [phone].   DIET:  We do recommend a small meal at first, but then you may proceed to your regular diet.  Drink  plenty of fluids but you should avoid alcoholic beverages for 24 hours.  MEDICATIONS: Continue present medications.   ACTIVITY:  You should plan to take it easy for the rest of today and you should NOT DRIVE or use heavy machinery until tomorrow (because of the sedation medicines used during the test).    FOLLOW UP: Our staff will call the number listed on your records the next business day following your procedure to check on you and address any questions or concerns that you may have regarding the information given to you following your procedure. If we do not reach you, we will leave a message.  However, if you are feeling well and you are not experiencing any problems, there is no need to return our call.  We will assume that you have returned to your regular daily activities without incident.  If any biopsies were taken you will be contacted by phone or by letter within the next 1-3 weeks.  Please call us at [phone] if you have not heard about the biopsies in 3 weeks.   Thank you for allowing [place] to provide for your healthcare needs today.  SIGNATURES/CONFIDENTIALITY: You and/or your care partner have signed paperwork which will be entered into your electronic medical record.  These signatures attest to the fact that that the information above on your After Visit Summary has been reviewed and is understood.  Full responsibility of the confidentiality of this discharge information  lies with you and/or your care-partner.

## 2017-06-26 NOTE — Op Note (Signed)
[place] Patient Name: [name] [name] Procedure Date: 06/26/2017 2:45 PM MRN: [id number] Endoscopist: Mallie Mussel L. Loletha Carrow , MD Age: 62 Referring MD:  Date of Birth: 01-15-1956 Gender: Female Account #: [id number] Procedure:                Upper GI endoscopy Indications:              Generalized abdominal pain, Abdominal bloating,                            Nausea Medicines:                Monitored Anesthesia Care Procedure:                Pre-Anesthesia Assessment:                           - Prior to the procedure, a History and Physical                            was performed, and patient medications and                            allergies were reviewed. The patient's tolerance of                            previous anesthesia was also reviewed. The risks                            and benefits of the procedure and the sedation                            options and risks were discussed with the patient.                            All questions were answered, and informed consent                            was obtained. Prior Anticoagulants: The patient has                            taken no previous anticoagulant or antiplatelet                            agents. ASA Grade Assessment: II - A patient with                            mild systemic disease. After reviewing the risks                            and benefits, the patient was deemed in                            satisfactory condition to undergo the procedure.  After obtaining informed consent, the endoscope was                            passed under direct vision. Throughout the                            procedure, the patient's blood pressure, pulse, and                            oxygen saturations were monitored continuously. The                            Endoscope was introduced through the mouth, and                            advanced to the second part of duodenum. The upper                             GI endoscopy was accomplished without difficulty.                            The patient tolerated the procedure well. Scope In: Scope Out: Findings:                 The esophagus was normal.                           The stomach was normal.                           The cardia and gastric fundus were normal on                            retroflexion.                           The examined duodenum was normal. Complications:            No immediate complications. Estimated Blood Loss:     Estimated blood loss: none. Impression:               - Normal esophagus.                           - Normal stomach.                           - Normal examined duodenum.                           - No specimens collected.                           Suspect side effects of chronic opioid therapy. Recommendation:           - Patient has a contact number available for  emergencies. The signs and symptoms of potential                            delayed complications were discussed with the                            patient. Return to normal activities tomorrow.                            Written discharge instructions were provided to the                            patient.                           - Resume previous diet.                           - Continue present medications.                           - See the other procedure note for documentation of                            additional recommendations. Henry L. Loletha Carrow, MD 06/26/2017 3:22:38 PM This report has been signed electronically.

## 2017-06-26 NOTE — Op Note (Signed)
[place] Patient Name: [name] [name] Procedure Date: 06/26/2017 2:45 PM MRN: [id number] Endoscopist: Mallie Mussel L. Loletha Carrow , MD Age: 62 Referring MD:  Date of Birth: Oct 25, 1955 Gender: Female Account #: [id number] Procedure:                Colonoscopy Indications:              Generalized abdominal pain. Patient also reports                            history of Crohn's colitis. Records from [place]                            practice suggest no active colitis in 2014. Medicines:                Monitored Anesthesia Care Procedure:                Pre-Anesthesia Assessment:                           - Prior to the procedure, a History and Physical                            was performed, and patient medications and                            allergies were reviewed. The patient's tolerance of                            previous anesthesia was also reviewed. The risks                            and benefits of the procedure and the sedation                            options and risks were discussed with the patient.                            All questions were answered, and informed consent                            was obtained. Prior Anticoagulants: The patient has                            taken no previous anticoagulant or antiplatelet                            agents. ASA Grade Assessment: II - A patient with                            mild systemic disease. After reviewing the risks                            and benefits, the patient was deemed in  satisfactory condition to undergo the procedure.                           After obtaining informed consent, the colonoscope                            was passed under direct vision. Throughout the                            procedure, the patient's blood pressure, pulse, and                            oxygen saturations were monitored continuously. The                            Model PCF-H190DL  [id number]) scope was introduced                            through the anus and advanced to the the cecum,                            identified by appendiceal orifice and ileocecal                            valve. The colonoscopy was performed with                            difficulty due to significant looping. Successful                            completion of the procedure was aided by using                            manual pressure. The patient tolerated the                            procedure well. The quality of the bowel                            preparation was excellent. The ileocecal valve,                            appendiceal orifice, and rectum were photographed.                            The quality of the bowel preparation was evaluated                            using the BBPS [facility] Bowel Preparation Scale)                            with scores of: Right Colon = 3, Transverse Colon =  3 and Left Colon = 3 (entire mucosa seen well with                            no residual staining, small fragments of stool or                            opaque liquid). The total BBPS score equals 9. The                            bowel preparation used was. Scope In: 2:54:30 PM Scope Out: 3:16:24 PM Scope Withdrawal Time: 0 hours 12 minutes 21 seconds  Total Procedure Duration: 0 hours 21 minutes 54 seconds  Findings:                 The perianal and digital rectal examinations were                            normal.                           The colon (entire examined portion) was                            significantly redundant.This precluded intubation                            of the terminal ileum.                           Retroflexion in the rectum was not performed due to                            anatomy.                           The exam was otherwise without abnormality. Complications:            No immediate  complications. Estimated Blood Loss:     Estimated blood loss: none. Impression:               - Redundant colon.                           - The examination was otherwise normal.                           - No specimens collected.                           Symptom complex most consistent with IBS and opioid                            side effects. Recommendation:           - Patient has a contact number available for                            emergencies. The  signs and symptoms of potential                            delayed complications were discussed with the                            patient. Return to normal activities tomorrow.                            Written discharge instructions were provided to the                            patient.                           - Resume previous diet.                           - Continue present medications.                           - Repeat colonoscopy in 10 years for screening                            purposes.                           - Miralax daily for regularity. Kiyaan Haq L. Loletha Carrow, MD 06/26/2017 3:29:00 PM This report has been signed electronically.

## 2017-06-26 NOTE — Progress Notes (Signed)
Report given to PACU, vss 

## 2017-06-27 ENCOUNTER — Telehealth: Payer: Self-pay

## 2017-06-27 NOTE — Telephone Encounter (Signed)
  Follow up Call-  Call back number 06/26/2017  Post procedure Call Back phone  # [phone]  Permission to leave phone message Yes  Some recent data might be hidden     Patient questions:  Do you have a fever, pain , or abdominal swelling? No. Pain Score  0 *  Have you tolerated food without any problems? Yes.    Have you been able to return to your normal activities? Yes.    Do you have any questions about your discharge instructions: Diet   No. Medications  No. Follow up visit  No.  Do you have questions or concerns about your Care? No.  Actions: * If pain score is 4 or above: No action needed, pain <4.

## 2017-07-15 ENCOUNTER — Other Ambulatory Visit: Payer: Self-pay | Admitting: Physical Medicine & Rehabilitation

## 2017-07-19 ENCOUNTER — Encounter (HOSPITAL_BASED_OUTPATIENT_CLINIC_OR_DEPARTMENT_OTHER): Payer: Medicare Other | Admitting: Registered Nurse

## 2017-07-19 ENCOUNTER — Encounter: Payer: Self-pay | Admitting: Registered Nurse

## 2017-07-19 ENCOUNTER — Other Ambulatory Visit: Payer: Self-pay

## 2017-07-19 VITALS — BP 164/85 | HR 60

## 2017-07-19 DIAGNOSIS — M961 Postlaminectomy syndrome, not elsewhere classified: Secondary | ICD-10-CM

## 2017-07-19 DIAGNOSIS — M62838 Other muscle spasm: Secondary | ICD-10-CM | POA: Diagnosis not present

## 2017-07-19 DIAGNOSIS — M48061 Spinal stenosis, lumbar region without neurogenic claudication: Secondary | ICD-10-CM

## 2017-07-19 DIAGNOSIS — M5414 Radiculopathy, thoracic region: Secondary | ICD-10-CM | POA: Diagnosis not present

## 2017-07-19 DIAGNOSIS — Z79899 Other long term (current) drug therapy: Secondary | ICD-10-CM

## 2017-07-19 DIAGNOSIS — G894 Chronic pain syndrome: Secondary | ICD-10-CM

## 2017-07-19 DIAGNOSIS — I10 Essential (primary) hypertension: Secondary | ICD-10-CM | POA: Diagnosis not present

## 2017-07-19 DIAGNOSIS — M5417 Radiculopathy, lumbosacral region: Secondary | ICD-10-CM | POA: Diagnosis not present

## 2017-07-19 DIAGNOSIS — Z5181 Encounter for therapeutic drug level monitoring: Secondary | ICD-10-CM | POA: Diagnosis not present

## 2017-07-19 MED ORDER — MORPHINE SULFATE 15 MG PO TABS: 15.0000 mg | ORAL_TABLET | Freq: Two times a day (BID) | ORAL | 0 refills | Status: DC | PRN

## 2017-07-19 MED ORDER — MORPHINE-NALTREXONE 50-2 MG PO CPCR: 1.0000 | ORAL_CAPSULE | Freq: Every day | ORAL | 0 refills | Status: DC

## 2017-07-19 NOTE — Progress Notes (Signed)
Subjective:    Patient ID: [name] [name], female    DOB: 1956-02-26, 62 y.o.   MRN: [id number]  HPI: [name] [name] is a 62year old female who returns for follow up appointmentfor chronic pain and medication refill.She states her pain is located in her lower back and left hip. [name] [name] reports increase intensity of pain over the last few weeks, she has been compliant with her analgesics. Reports no relief in her pain..She rates her pain 8. Her current exercise regime is walking and performing stretching exercises.  [name] [name] arrived hypertensive, blood pressure rechecked, she states it's because she's experiencing increase intensity of pain. She states she is compliant with her antihypertensive medication. She will follow up with her PCP she states.   [name] [name] is currently prescribed  Embeda she's not receiving any relief of her pain with this medication. In the past she has been prescribed Xtampza and MS Contin. We the providers at [facility] would prefer for our patients to be prescribe abuse deterrent long acting medication. This has been discussed with Dr. Letta Pate, and he agrees with the above plan. MS Contin is not abuse deterrent, Morphabond is, please consider approving this medication.  [name] [name] Morphine equivalent is 80.00 MME.  Changing [name] [name] to Morphabond 15 mg every 8 hours and her MSIR 15 mg twice a day will decrease her MME to 75.00 MME.  Her last UDS was 11/23/2016 it was consistent. Oral Swab was performed today.   She underwent uncomplicated Bilateral re-exploration Lumbar Decompression L-2-3, L3-L4, L-4- 5L5-S1 metal removal (expedium) L2-3, L5-S1 Fusion exploration L4-5, L5-S1 Repair Non-Union with Re instrumentation Left iliac crest bone graftwith Dr. Velna [name] on 07/22/2016.   Pain Inventory Average Pain 8 Pain Right Now 8 My pain is constant, stabbing and aching  In the last 24 hours, has pain interfered  with the following? General activity 7 Relation with others 6 Enjoyment of life 8 What TIME of day is your pain at its worst? morning, evening, night Sleep (in general) Fair  Pain is worse with: walking, inactivity and standing Pain improves with: rest, heat/ice and medication Relief from Meds: 1  Mobility walk without assistance ability to climb steps?  yes do you drive?  yes Do you have any goals in this area?  yes  Function disabled: date disabled 10/2007 I need assistance with the following:  household duties and shopping Do you have any goals in this area?  yes  Neuro/Psych spasms  Prior Studies Any changes since last visit?  no  Physicians involved in your care Any changes since last visit?  no   Family History  Problem Relation Age of Onset  . Heart disease Mother 25       CHF; CAD  . Hypertension Mother   . Heart failure Mother   . Diabetes Sister   . Lupus Sister   . Kidney failure Sister   . Hypertension Brother   . Hyperlipidemia Sister   . Hypertension Sister   . Colon cancer Neg Hx   . Rectal cancer Neg Hx    Social History   Socioeconomic History  . Marital status: Widowed    Spouse name: None  . Number of children: 3  . Years of education: None  . Highest education level: None  Social Needs  . Financial resource strain: None  . Food insecurity - worry: None  . Food insecurity - inability: None  . Transportation needs - medical: None  .  Transportation needs - non-medical: None  Occupational History  . 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 Smoking status: Former Smoker    Packs/day: 0.50    Years: 20.00    Pack years: 10.00    Types: Cigarettes    Last attempt to quit: 07/21/2008    Years since quitting: 9.0  . Smokeless tobacco: Never Used  Substance and Sexual Activity  . Alcohol use: No  . Drug use: No  . Sexual activity: None  Other Topics Concern  . None  Social History Narrative   Marital  status: widowed since 2002; not dating which is bad in 2018      Children: 3 children (45, 62, 23); 13 grandchildren; 1 gg      Employment: disability since 2009; retail      Lives: alone in [place] in apartment      Tobacco: quit in 2011      Alcohol: none      Drugs: none      Exercise: minimal in 2018      ADLs: independent with ADLs; drives      Seatbelt: 100%; no texting.   Past Surgical History:  Procedure Laterality Date  . ABDOMINAL HYSTERECTOMY     DUB; ovaries intact  . BREAST BIOPSY    . CARDIAC CATHETERIZATION  1990's   [name] [name]  . COLON SURGERY  05/11/11   [facility] of adhesions Crohn's  . REDUCTION MAMMAPLASTY    . SPINE SURGERY     Past Medical History:  Diagnosis Date  . Blood transfusion without reported diagnosis   . Crohn's disease ([place])   . Disorder of sacroiliac joint   . Hyperlipidemia   . Hypertension   . Ischemic colitis ([place])   . Lumbago   . Lumbar post-laminectomy syndrome   . Lumbosacral neuritis   . Lumbosacral radiculitis   . Sciatica    BP (!) 170/90 Comment: checked twice  Pulse 68   SpO2 95%   Opioid Risk Score:  0 Fall Risk Score:  `1  Depression screen PHQ 2/9  Depression screen [facility] 2/9 07/19/2017 04/03/2017 02/23/2017 01/02/2017 10/25/2016 09/27/2016 09/21/2016  Decreased Interest 0 0 0 0 0 0 0  Down, Depressed, Hopeless 3 0 0 0 0 0 0  PHQ - 2 Score 3 0 0 0 0 0 0  Altered sleeping - - - - - - -  Tired, decreased energy - - - - - - -  Change in appetite - - - - - - -  Feeling bad or failure about yourself  - - - - - - -  Trouble concentrating - - - - - - -  Moving slowly or fidgety/restless - - - - - - -  Suicidal thoughts - - - - - - -  PHQ-9 Score - - - - - - -  Difficult doing work/chores - - - - - - -   Review of Systems  Constitutional: Negative.   HENT: Negative.   Eyes: Negative.   Respiratory: Negative.   Cardiovascular: Negative.   Gastrointestinal: Negative.   Endocrine: Negative.   Genitourinary: Negative.    Musculoskeletal: Positive for back pain, gait problem, myalgias and neck pain.       Spasms  Skin: Negative.   Allergic/Immunologic: Negative.   Hematological: Negative.   Psychiatric/Behavioral: Negative.   All other systems reviewed and are negative.      Objective:   Physical Exam  Constitutional: She is oriented to person, place, and time. She appears well-developed and well-nourished.  HENT:  Head: Normocephalic and atraumatic.  Neck: Normal range of motion. Neck supple.  Cardiovascular: Normal rate and regular rhythm.  Pulmonary/Chest: Effort normal and breath sounds normal.  Musculoskeletal:  Normal Muscle Bulk and Muscle Testing Reveals: Upper Extremities: Full ROM and Muscle Strength 5/5 Lumbar Hypersensitivity Left Greater Trochanter Tenderness Lower Extremities: Full ROM and Muscle Strength 5/5 Arises from Table Slowly, using straight cane for support Antalgic Gait   Neurological: She is alert and oriented to person, place, and time.  Skin: Skin is warm and dry.  Psychiatric: She has a normal mood and affect.  Nursing note and vitals reviewed.         Assessment & Plan:  1.Lumbar Postlaminectomy/ Spinal Stenosis Lumbar Region/ Post-Op Pain/Low back pain with radiating symptoms in L3-4 distribution: continues to be limited by pain. Refilled: Embeda 50 mg One capsule daily and MSIR 15 mg one tablet twice a day as needed for moderate to sever pain. Awaiting on approval of Morphabond 15 mg Q 8 hours.Marland Kitchen  07/19/2017. . We will continue the opioid monitoring program, this consists of regular clinic visits, examinations, urine drug screen, pill counts as well as use of [place] Controlled Substance Reporting System. Encouraged to Continue exercise regime.  2. Lumbar Radiculopathy: No complaints today.Continue to Monitor 07/19/2017 3. Chronic Right Knee Pain:No complaints today. Continue with Ice Therapy, Voltaren Gel. 07/19/2017 4. Muscle Spasm: Continue:  Tizanidine. 01//30/2019  30 minutes of face to face patient care time was spent during this visit. All questions were encouraged and answered.  F/U in 1 month

## 2017-07-21 MED ORDER — MORPHINE SULFATE ER 15 MG PO T12A: 15.0000 mg | EXTENDED_RELEASE_TABLET | Freq: Three times a day (TID) | ORAL | 0 refills | Status: DC

## 2017-07-21 NOTE — Addendum Note (Signed)
Addended by: Charlett Blake on: 07/21/2017 03:30 PM   Modules accepted: Orders

## 2017-07-21 NOTE — Addendum Note (Signed)
Addended by: Charlett Blake on: 07/21/2017 04:10 PM   Modules accepted: Orders

## 2017-07-24 LAB — DRUG TOX MONITOR 1 W/CONF, ORAL FLD
Amphetamines: NEGATIVE ng/mL (ref <10)
BENZODIAZEPINES: NEGATIVE ng/mL (ref <0.50)
BUPRENORPHINE: NEGATIVE ng/mL (ref <0.10)
Barbiturates: NEGATIVE ng/mL (ref <10)
CODEINE: NEGATIVE ng/mL (ref <2.5)
Cocaine: NEGATIVE ng/mL (ref <5.0)
Cotinine: 88.4 ng/mL — ABNORMAL HIGH (ref <5.0)
DIHYDROCODEINE: NEGATIVE ng/mL (ref <2.5)
Fentanyl: NEGATIVE ng/mL (ref <0.10)
HEROIN METABOLITE: NEGATIVE ng/mL (ref <1.0)
HYDROMORPHONE: NEGATIVE ng/mL (ref <2.5)
Hydrocodone: NEGATIVE ng/mL (ref <2.5)
MARIJUANA: NEGATIVE ng/mL (ref <2.5)
MDMA: NEGATIVE ng/mL (ref <10)
MORPHINE: 62.3 ng/mL — AB (ref <2.5)
Meprobamate: NEGATIVE ng/mL (ref <2.5)
Methadone: NEGATIVE ng/mL (ref <5.0)
NOROXYCODONE: NEGATIVE ng/mL (ref <2.5)
Nicotine Metabolite: POSITIVE ng/mL — AB (ref <5.0)
Norhydrocodone: NEGATIVE ng/mL (ref <2.5)
OPIATES: POSITIVE ng/mL — AB (ref <2.5)
Oxycodone: NEGATIVE ng/mL (ref <2.5)
Oxymorphone: NEGATIVE ng/mL (ref <2.5)
Phencyclidine: NEGATIVE ng/mL (ref <10)
TAPENTADOL: NEGATIVE ng/mL (ref <5.0)
TRAMADOL: NEGATIVE ng/mL (ref <5.0)
Zolpidem: NEGATIVE ng/mL (ref <5.0)

## 2017-07-24 LAB — DRUG TOX ALC METAB W/CON, ORAL FLD: ALCOHOL METABOLITE: NEGATIVE ng/mL (ref <25)

## 2017-07-26 ENCOUNTER — Telehealth: Payer: Self-pay | Admitting: *Deleted

## 2017-07-26 NOTE — Telephone Encounter (Signed)
Oral swab drug screen was consistent for prescribed medications.  ?

## 2017-08-10 ENCOUNTER — Other Ambulatory Visit: Payer: Self-pay | Admitting: Family Medicine

## 2017-08-10 DIAGNOSIS — Z79890 Hormone replacement therapy (postmenopausal): Secondary | ICD-10-CM

## 2017-08-10 NOTE — Telephone Encounter (Signed)
Prempro refill request Last OV 09/21/16   It was mentioned this would be weaned over the next 6 months per Dr. Tamala Julian.  [facility] [address] [place], [place]  [address]

## 2017-08-17 ENCOUNTER — Other Ambulatory Visit: Payer: Self-pay | Admitting: Gastroenterology

## 2017-08-17 NOTE — Telephone Encounter (Signed)
Incoming refill request for promethazine 25 mg tabs one q 8 hours as needed.  Last seen 06-2017. Please advise on refills.

## 2017-08-18 ENCOUNTER — Encounter: Payer: Medicare Other | Attending: Physical Medicine & Rehabilitation | Admitting: Registered Nurse

## 2017-08-18 ENCOUNTER — Encounter: Payer: Self-pay | Admitting: Registered Nurse

## 2017-08-18 VITALS — BP 137/89 | HR 52

## 2017-08-18 DIAGNOSIS — G894 Chronic pain syndrome: Secondary | ICD-10-CM | POA: Diagnosis not present

## 2017-08-18 DIAGNOSIS — M62838 Other muscle spasm: Secondary | ICD-10-CM

## 2017-08-18 DIAGNOSIS — M961 Postlaminectomy syndrome, not elsewhere classified: Secondary | ICD-10-CM | POA: Diagnosis not present

## 2017-08-18 DIAGNOSIS — M48061 Spinal stenosis, lumbar region without neurogenic claudication: Secondary | ICD-10-CM | POA: Diagnosis not present

## 2017-08-18 DIAGNOSIS — M5414 Radiculopathy, thoracic region: Secondary | ICD-10-CM | POA: Diagnosis not present

## 2017-08-18 DIAGNOSIS — Z79899 Other long term (current) drug therapy: Secondary | ICD-10-CM | POA: Diagnosis not present

## 2017-08-18 DIAGNOSIS — M5417 Radiculopathy, lumbosacral region: Secondary | ICD-10-CM | POA: Insufficient documentation

## 2017-08-18 DIAGNOSIS — Z5181 Encounter for therapeutic drug level monitoring: Secondary | ICD-10-CM | POA: Diagnosis not present

## 2017-08-18 MED ORDER — MORPHINE SULFATE ER 15 MG PO T12A: 15.0000 mg | EXTENDED_RELEASE_TABLET | Freq: Three times a day (TID) | ORAL | 0 refills | Status: DC

## 2017-08-18 MED ORDER — MORPHINE SULFATE 15 MG PO TABS: 15.0000 mg | ORAL_TABLET | Freq: Two times a day (BID) | ORAL | 0 refills | Status: DC | PRN

## 2017-08-18 NOTE — Progress Notes (Signed)
Subjective:    Patient ID: [name] [name], female    DOB: [birth year], 62 y.o.   MRN: [id number]  HPI: [name] is a 62year old female who returns for follow up appointmentfor chronic pain and medication refill.She states her pain is located in her lower back. She rates her pain 8. [name] states she has noticed improvement with her pain being controlled with medication change.Her current exercise regime is walking and performing stretching exercises.  [name] arrived bradycardic apical pulse check, she denies any chest pain, SOB or distress. Refuses ED or Urgent Care evaluation. She will follow up with her PCP she states.  [name] Morphine equivalent is 80.17 MME.    Her last Oral Swab was performed on 07/19/2017, it was consistent.  She underwent uncomplicated Bilateral re-exploration Lumbar Decompression L-2-3, L3-L4, L-4- 5L5-S1 metal removal (expedium) L2-3, L5-S1 Fusion exploration L4-5, L5-S1 Repair Non-Union with Re instrumentation Left iliac crest bone graftwith Dr. Velna Ochs on 07/22/2016.   Pain Inventory Average Pain 7 Pain Right Now 8 My pain is constant, stabbing and aching  In the last 24 hours, has pain interfered with the following? General activity 7 Relation with others 7 Enjoyment of life 7 What TIME of day is your pain at its worst? morning, evening, night Sleep (in general) Fair  Pain is worse with: walking, inactivity and standing Pain improves with: rest, heat/ice and medication Relief from Meds: 2  Mobility walk without assistance ability to climb steps?  yes do you drive?  yes Do you have any goals in this area?  yes  Function disabled: date disabled 10/2007 I need assistance with the following:  household duties and shopping Do you have any goals in this area?  yes  Neuro/Psych spasms  Prior Studies Any changes since last visit?  no  Physicians involved in your care Any changes since last visit?  no   Family  History  Problem Relation Age of Onset  . Heart disease Mother 54       CHF; CAD  . Hypertension Mother   . Heart failure Mother   . Diabetes Sister   . Lupus Sister   . Kidney failure Sister   . Hypertension Brother   . Hyperlipidemia Sister   . Hypertension Sister   . Colon cancer Neg Hx   . Rectal cancer Neg Hx    Social History   Socioeconomic History  . Marital status: Widowed    Spouse name: Not on file  . Number of children: 3  . Years of education: Not on file  . Highest education level: Not on file  Social Needs  . Financial resource strain: Not on file  . Food insecurity - worry: Not on file  . Food insecurity - inability: Not on file  . Transportation needs - medical: Not on file  . Transportation needs - non-medical: Not on file  Occupational History  . 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 Smoking status: Former Smoker    Packs/day: 0.50    Years: 20.00    Pack years: 10.00    Types: Cigarettes    Last attempt to quit: 07/21/2008    Years since quitting: 9.0  . Smokeless tobacco: Never Used  Substance and Sexual Activity  . Alcohol use: No  . Drug use: No  . Sexual activity: Not on file  Other Topics Concern  . Not on file  Social History Narrative  Marital status: widowed since 2002; not dating which is bad in 2018      Children: 3 children (45, 19, 55); 13 grandchildren; 1 gg      Employment: disability since 2009; retail      Lives: alone in [place] in apartment      Tobacco: quit in 2011      Alcohol: none      Drugs: none      Exercise: minimal in 2018      ADLs: independent with ADLs; drives      Seatbelt: 100%; no texting.   Past Surgical History:  Procedure Laterality Date  . ABDOMINAL HYSTERECTOMY     DUB; ovaries intact  . BREAST BIOPSY    . CARDIAC CATHETERIZATION  1990's   [name]  . COLON SURGERY  05/11/11   [facility] of adhesions Crohn's  . REDUCTION MAMMAPLASTY    . SPINE SURGERY      Past Medical History:  Diagnosis Date  . Blood transfusion without reported diagnosis   . Crohn's disease ([place])   . Disorder of sacroiliac joint   . Hyperlipidemia   . Hypertension   . Ischemic colitis ([place])   . Lumbago   . Lumbar post-laminectomy syndrome   . Lumbosacral neuritis   . Lumbosacral radiculitis   . Sciatica    There were no vitals taken for this visit.  Opioid Risk Score:  0 Fall Risk Score:  `1  Depression screen PHQ 2/9  Depression screen [facility] 2/9 07/19/2017 04/03/2017 02/23/2017 01/02/2017 10/25/2016 09/27/2016 09/21/2016  Decreased Interest 0 0 0 0 0 0 0  Down, Depressed, Hopeless 3 0 0 0 0 0 0  PHQ - 2 Score 3 0 0 0 0 0 0  Altered sleeping - - - - - - -  Tired, decreased energy - - - - - - -  Change in appetite - - - - - - -  Feeling bad or failure about yourself  - - - - - - -  Trouble concentrating - - - - - - -  Moving slowly or fidgety/restless - - - - - - -  Suicidal thoughts - - - - - - -  PHQ-9 Score - - - - - - -  Difficult doing work/chores - - - - - - -   Review of Systems  Constitutional: Negative.   HENT: Negative.   Eyes: Negative.   Respiratory: Negative.   Cardiovascular: Negative.   Gastrointestinal: Negative.   Endocrine: Negative.   Genitourinary: Negative.   Musculoskeletal: Positive for back pain, gait problem, myalgias and neck pain.       Spasms  Skin: Negative.   Allergic/Immunologic: Negative.   Hematological: Negative.   Psychiatric/Behavioral: Negative.   All other systems reviewed and are negative.      Objective:   Physical Exam  Constitutional: She is oriented to person, place, and time. She appears well-developed and well-nourished.  HENT:  Head: Normocephalic and atraumatic.  Neck: Normal range of motion. Neck supple.  Cardiovascular: Normal rate and regular rhythm.  Pulmonary/Chest: Effort normal and breath sounds normal.  Musculoskeletal:  Normal Muscle Bulk and Muscle Testing Reveals: Upper Extremities:  Full ROM and Muscle Strength 5/5 Lumbar Paraspinal Tenderness: L-3-L-5 Lower Extremities:Full  ROM and Muscle Strength 5/5 Arises from Table Slowly, using straight cane for support Narrow Based Gait  Neurological: She is alert and oriented to person, place, and time.  Skin: Skin is warm and dry.  Psychiatric: She has  a normal mood and affect.  Nursing note and vitals reviewed.         Assessment & Plan:  1.Lumbar Postlaminectomy/ Spinal Stenosis Lumbar Region/ Post-Op Pain/Low back pain with radiating symptoms in L3-4 distribution: continues to be limited by pain. Refilled: MSIR 15 mg one tablet twice a day as needed for moderate to sever pain and  Morphabond 15 mg Q 8 hours.  08/18/2017. . We will continue the opioid monitoring program, this consists of regular clinic visits, examinations, urine drug screen, pill counts as well as use of [place] Controlled Substance Reporting System. Encouraged to Continue exercise regime.  2. Lumbar Radiculopathy: No complaints today.Continue to Monitor 08/18/2017 3. Chronic Right Knee Pain:No complaints today. Continue with Ice Therapy, Voltaren Gel. 08/18/2017 4. Muscle Spasm: Continue: Tizanidine. 03//06/2017  20 minutes of face to face patient care time was spent during this visit. All questions were encouraged and answered.  F/U in 1 month

## 2017-09-18 ENCOUNTER — Encounter: Payer: Self-pay | Admitting: Registered Nurse

## 2017-09-18 ENCOUNTER — Encounter: Payer: Medicare Other | Attending: Physical Medicine & Rehabilitation | Admitting: Registered Nurse

## 2017-09-18 ENCOUNTER — Other Ambulatory Visit: Payer: Self-pay

## 2017-09-18 VITALS — BP 145/87 | HR 63 | Ht 64.0 in | Wt 211.0 lb

## 2017-09-18 DIAGNOSIS — Z5181 Encounter for therapeutic drug level monitoring: Secondary | ICD-10-CM | POA: Diagnosis not present

## 2017-09-18 DIAGNOSIS — M961 Postlaminectomy syndrome, not elsewhere classified: Secondary | ICD-10-CM | POA: Diagnosis not present

## 2017-09-18 DIAGNOSIS — G894 Chronic pain syndrome: Secondary | ICD-10-CM

## 2017-09-18 DIAGNOSIS — M48061 Spinal stenosis, lumbar region without neurogenic claudication: Secondary | ICD-10-CM | POA: Diagnosis not present

## 2017-09-18 DIAGNOSIS — Z79899 Other long term (current) drug therapy: Secondary | ICD-10-CM | POA: Diagnosis not present

## 2017-09-18 DIAGNOSIS — M5417 Radiculopathy, lumbosacral region: Secondary | ICD-10-CM | POA: Diagnosis not present

## 2017-09-18 DIAGNOSIS — M62838 Other muscle spasm: Secondary | ICD-10-CM | POA: Diagnosis not present

## 2017-09-18 DIAGNOSIS — M5414 Radiculopathy, thoracic region: Secondary | ICD-10-CM | POA: Insufficient documentation

## 2017-09-18 MED ORDER — MORPHINE SULFATE 15 MG PO TABS: 15.0000 mg | ORAL_TABLET | Freq: Two times a day (BID) | ORAL | 0 refills | Status: DC | PRN

## 2017-09-18 MED ORDER — MORPHINE SULFATE ER 15 MG PO T12A: 15.0000 mg | EXTENDED_RELEASE_TABLET | Freq: Three times a day (TID) | ORAL | 0 refills | Status: DC

## 2017-09-18 NOTE — Progress Notes (Signed)
Subjective:    Patient ID: [name] [name], female    DOB: 1956/03/30, 62 y.o.   MRN: [id number]  HPI: [name] is a 62 year old female who returns for follow up appointment for chronic pain and medication refill. She states her pain is located in her lower back. She rates her pain 8. Her current exercise regime is walking and performing stretching exercises.   [name] [name] equivalent is 74.00 MME.    Last Oral Swab was performed on 07/19/2017, it was consistent.   Pain Inventory Average Pain 8 Pain Right Now 8 My pain is constant, sharp and aching  In the last 24 hours, has pain interfered with the following? General activity 5 Relation with others 5 Enjoyment of life 5 What TIME of day is your pain at its worst? evening and night Sleep (in general) Fair  Pain is worse with: walking, bending, sitting, inactivity and standing Pain improves with: rest, heat/ice and medication Relief from Meds: 2  Mobility how many minutes can you walk? 15 ability to climb steps?  yes do you drive?  yes  Function disabled: date disabled 10/2007  Neuro/Psych spasms  Prior Studies Any changes since last visit?  no  Physicians involved in your care Any changes since last visit?  no   Family History  Problem Relation Age of Onset  . Heart disease Mother 1       CHF; CAD  . Hypertension Mother   . Heart failure Mother   . Diabetes Sister   . Lupus Sister   . Kidney failure Sister   . Hypertension Brother   . Hyperlipidemia Sister   . Hypertension Sister   . Colon cancer Neg Hx   . Rectal cancer Neg Hx    Social History   Socioeconomic History  . Marital status: Widowed    Spouse name: Not on file  . Number of children: 3  . Years of education: Not on file  . Highest education level: Not on file  Occupational History  . 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 Financial resource strain: Not on file  . Food  insecurity:    Worry: Not on file    Inability: Not on file  . Transportation needs:    Medical: Not on file    Non-medical: Not on file  Tobacco Use  . Smoking status: Former Smoker    Packs/day: 0.50    Years: 20.00    Pack years: 10.00    Types: Cigarettes    Last attempt to quit: 07/21/2008    Years since quitting: 9.1  . Smokeless tobacco: Never Used  Substance and Sexual Activity  . Alcohol use: No  . Drug use: No  . Sexual activity: Not on file  Lifestyle  . Physical activity:    Days per week: Not on file    Minutes per session: Not on file  . Stress: Not on file  Relationships  . Social connections:    Talks on phone: Not on file    Gets together: Not on file    Attends religious service: Not on file    Active member of club or organization: Not on file    Attends meetings of clubs or organizations: Not on file    Relationship status: Not on file  Other Topics Concern  . Not on file  Social History Narrative   Marital status: widowed since 2002; not dating which  is bad in 2018      Children: 3 children (45, 42, 40); 13 grandchildren; 1 gg      Employment: disability since 2009; retail      Lives: alone in [place] in apartment      Tobacco: quit in 2011      Alcohol: none      Drugs: none      Exercise: minimal in 2018      ADLs: independent with ADLs; drives      Seatbelt: 100%; no texting.   Past Surgical History:  Procedure Laterality Date  . ABDOMINAL HYSTERECTOMY     DUB; ovaries intact  . BREAST BIOPSY    . CARDIAC CATHETERIZATION  1990's   [name]  . COLON SURGERY  05/11/11   [facility] of adhesions Crohn's  . REDUCTION MAMMAPLASTY    . SPINE SURGERY     Past Medical History:  Diagnosis Date  . Blood transfusion without reported diagnosis   . Crohn's disease ([place])   . Disorder of sacroiliac joint   . Hyperlipidemia   . Hypertension   . Ischemic colitis ([place])   . Lumbago   . Lumbar post-laminectomy syndrome   . Lumbosacral  neuritis   . Lumbosacral radiculitis   . Sciatica      Opioid Risk Score:   Fall Risk Score:  `1  Depression screen PHQ 2/9  Depression screen [facility] 2/9 07/19/2017 04/03/2017 02/23/2017 01/02/2017 10/25/2016 09/27/2016 09/21/2016  Decreased Interest 0 0 0 0 0 0 0  Down, Depressed, Hopeless 3 0 0 0 0 0 0  PHQ - 2 Score 3 0 0 0 0 0 0  Altered sleeping - - - - - - -  Tired, decreased energy - - - - - - -  Change in appetite - - - - - - -  Feeling bad or failure about yourself  - - - - - - -  Trouble concentrating - - - - - - -  Moving slowly or fidgety/restless - - - - - - -  Suicidal thoughts - - - - - - -  PHQ-9 Score - - - - - - -  Difficult doing work/chores - - - - - - -   Review of Systems     Objective:   Physical Exam  Constitutional: She is oriented to person, place, and time. She appears well-developed and well-nourished.  HENT:  Head: Normocephalic and atraumatic.  Neck: Normal range of motion. Neck supple.  Cardiovascular: Normal rate and regular rhythm.  Pulmonary/Chest: Effort normal and breath sounds normal.  Musculoskeletal:  Normal Muscle Bulk and Muscle Testing Reveals: Upper Extremities: Full ROM and Muscle Strength 5/5 Lumbar Paraspinal Tenderness: L-4-L-5 Lower Extremities: Full ROM and Muscle Strength 5/5 Arises from Table Slowly Narrow Based Gait  Neurological: She is alert and oriented to person, place, and time.  Skin: Skin is warm and dry.  Psychiatric: She has a normal mood and affect.  Nursing note and vitals reviewed.         Assessment & Plan:  1.Lumbar Postlaminectomy/ Spinal Stenosis Lumbar Region/ Post-Op Pain/Low back pain with radiating symptoms in L3-4 distribution: continues to be limited by pain. Refilled: Continue with current Treatment Regimen : MSIR 15 mg one tablet twice a day as needed for moderate to sever pain and  Morphabond 15 mg Q 8 hours.  09/18/2017. . We will continue the opioid monitoring program, this consists of regular  clinic visits, examinations, urine drug screen, pill  counts as well as use of [place] Controlled Substance Reporting System. Encouraged to Continue exercise regime.  2. Lumbar Radiculopathy: No complaints today.Continue to Monitor 09/18/2017 3. Chronic Right Knee Pain:No complaints today. Continue with Ice Therapy and  Voltaren Gel. 09/18/2017 4. Muscle Spasm: Continue current treatment: Tizanidine. 04//06/2017  20 minutes of face to face patient care time was spent during this visit. All questions were encouraged and answered.  F/U in 1 month

## 2017-10-04 ENCOUNTER — Encounter: Payer: Medicare Other | Admitting: Family Medicine

## 2017-10-18 ENCOUNTER — Encounter: Payer: Self-pay | Admitting: Registered Nurse

## 2017-10-18 ENCOUNTER — Encounter: Payer: Medicare Other | Attending: Physical Medicine & Rehabilitation | Admitting: Registered Nurse

## 2017-10-18 VITALS — BP 142/77 | HR 52 | Resp 14 | Ht 65.0 in | Wt 215.0 lb

## 2017-10-18 DIAGNOSIS — M48061 Spinal stenosis, lumbar region without neurogenic claudication: Secondary | ICD-10-CM | POA: Diagnosis not present

## 2017-10-18 DIAGNOSIS — Z79899 Other long term (current) drug therapy: Secondary | ICD-10-CM | POA: Diagnosis not present

## 2017-10-18 DIAGNOSIS — M961 Postlaminectomy syndrome, not elsewhere classified: Secondary | ICD-10-CM | POA: Insufficient documentation

## 2017-10-18 DIAGNOSIS — M62838 Other muscle spasm: Secondary | ICD-10-CM

## 2017-10-18 DIAGNOSIS — Z5181 Encounter for therapeutic drug level monitoring: Secondary | ICD-10-CM | POA: Diagnosis not present

## 2017-10-18 DIAGNOSIS — M5414 Radiculopathy, thoracic region: Secondary | ICD-10-CM | POA: Diagnosis not present

## 2017-10-18 DIAGNOSIS — M5417 Radiculopathy, lumbosacral region: Secondary | ICD-10-CM | POA: Diagnosis not present

## 2017-10-18 DIAGNOSIS — G894 Chronic pain syndrome: Secondary | ICD-10-CM | POA: Diagnosis not present

## 2017-10-18 MED ORDER — TIZANIDINE HCL 2 MG PO TABS: 2.0000 mg | ORAL_TABLET | Freq: Two times a day (BID) | ORAL | 1 refills | Status: DC | PRN

## 2017-10-18 MED ORDER — DICLOFENAC SODIUM 1 % TD GEL: 2.0000 g | Freq: Four times a day (QID) | TRANSDERMAL | 2 refills | Status: DC

## 2017-10-18 MED ORDER — MORPHINE SULFATE 15 MG PO TABS: 15.0000 mg | ORAL_TABLET | Freq: Two times a day (BID) | ORAL | 0 refills | Status: DC | PRN

## 2017-10-18 MED ORDER — MORPHINE SULFATE ER 15 MG PO T12A: 15.0000 mg | EXTENDED_RELEASE_TABLET | Freq: Three times a day (TID) | ORAL | 0 refills | Status: DC

## 2017-10-18 NOTE — Progress Notes (Signed)
Subjective:    Patient ID: [name] [name], female    DOB: Jan 11, 1956, 62 y.o.   MRN: [id number]  HPI: [name] [name] is a 62 year old female who returns for follow up appointment for chronic pain and medication refill. She states her pain is located in her lower back. She rates her pain 8. Her  current exercise regime is walking and performing stretching exercises.   [name] [name] Morphine Equivalent is 78.00 MME.  Last Oral Swab was Performed on 07/19/2017, it was consistent.   Pain Inventory Average Pain 7 Pain Right Now 8 My pain is constant, sharp and aching  In the last 24 hours, has pain interfered with the following? General activity 7 Relation with others 7 Enjoyment of life 7 What TIME of day is your pain at its worst? morning, evening, night Sleep (in general) Fair  Pain is worse with: walking, bending, inactivity and standing Pain improves with: rest, heat/ice and medication Relief from Meds: 2  Mobility walk without assistance how many minutes can you walk? 10 ability to climb steps?  yes do you drive?  yes Do you have any goals in this area?  yes  Function not employed: date last employed 03/08 disabled: date disabled 03/08 I need assistance with the following:  household duties and shopping Do you have any goals in this area?  yes  Neuro/Psych No problems in this area  Prior Studies Any changes since last visit?  no  Physicians involved in your care Any changes since last visit?  no   Family History  Problem Relation Age of Onset  . Heart disease Mother 49       CHF; CAD  . Hypertension Mother   . Heart failure Mother   . Diabetes Sister   . Lupus Sister   . Kidney failure Sister   . Hypertension Brother   . Hyperlipidemia Sister   . Hypertension Sister   . Colon cancer Neg Hx   . Rectal cancer Neg Hx    Social History   Socioeconomic History  . Marital status: Widowed    Spouse name: Not on file  . Number of children: 3    . Years of education: Not on file  . Highest education level: Not on file  Occupational History  . 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 Financial resource strain: Not on file  . Food insecurity:    Worry: Not on file    Inability: Not on file  . Transportation needs:    Medical: Not on file    Non-medical: Not on file  Tobacco Use  . Smoking status: Former Smoker    Packs/day: 0.50    Years: 20.00    Pack years: 10.00    Types: Cigarettes    Last attempt to quit: 07/21/2008    Years since quitting: 9.2  . Smokeless tobacco: Never Used  Substance and Sexual Activity  . Alcohol use: No  . Drug use: No  . Sexual activity: Not on file  Lifestyle  . Physical activity:    Days per week: Not on file    Minutes per session: Not on file  . Stress: Not on file  Relationships  . Social connections:    Talks on phone: Not on file    Gets together: Not on file    Attends religious service: Not on file    Active member of club or organization: Not on  file    Attends meetings of clubs or organizations: Not on file    Relationship status: Not on file  Other Topics Concern  . Not on file  Social History Narrative   Marital status: widowed since 2002; not dating which is bad in 2018      Children: 3 children (45, 39, 98); 13 grandchildren; 1 gg      Employment: disability since 2009; retail      Lives: alone in [place] in apartment      Tobacco: quit in 2011      Alcohol: none      Drugs: none      Exercise: minimal in 2018      ADLs: independent with ADLs; drives      Seatbelt: 100%; no texting.   Past Surgical History:  Procedure Laterality Date  . ABDOMINAL HYSTERECTOMY     DUB; ovaries intact  . BREAST BIOPSY    . CARDIAC CATHETERIZATION  1990's   [name]  . COLON SURGERY  05/11/11   [facility] of adhesions Crohn's  . REDUCTION MAMMAPLASTY    . SPINE SURGERY     Past Medical History:  Diagnosis Date  . Blood transfusion  without reported diagnosis   . Crohn's disease ([place])   . Disorder of sacroiliac joint   . Hyperlipidemia   . Hypertension   . Ischemic colitis ([place])   . Lumbago   . Lumbar post-laminectomy syndrome   . Lumbosacral neuritis   . Lumbosacral radiculitis   . Sciatica    BP (!) 142/77 (BP Location: Right Arm, Patient Position: Sitting, Cuff Size: Large)   Pulse (!) 52   Resp 14   Ht 5\' 5"  (1.651 m)   Wt 215 lb (97.5 kg)   SpO2 96%   BMI 35.78 kg/m   Opioid Risk Score:   Fall Risk Score:  `1  Depression screen PHQ 2/9  Depression screen [facility] 2/9 10/18/2017 09/18/2017 07/19/2017 04/03/2017 02/23/2017 01/02/2017 10/25/2016  Decreased Interest 0 0 0 0 0 0 0  Down, Depressed, Hopeless 0 0 3 0 0 0 0  PHQ - 2 Score 0 0 3 0 0 0 0  Altered sleeping - 0 - - - - -  Tired, decreased energy - 0 - - - - -  Change in appetite - 0 - - - - -  Feeling bad or failure about yourself  - 0 - - - - -  Trouble concentrating - 0 - - - - -  Moving slowly or fidgety/restless - 0 - - - - -  Suicidal thoughts - 0 - - - - -  PHQ-9 Score - 0 - - - - -  Difficult doing work/chores - - - - - - -    Review of Systems  Constitutional: Negative.   HENT: Negative.   Eyes: Negative.   Respiratory: Negative.   Gastrointestinal: Negative.   Endocrine: Negative.   Genitourinary: Negative.   Musculoskeletal: Positive for back pain.  Skin: Negative.   Allergic/Immunologic: Negative.   Neurological: Negative.   Hematological: Negative.   Psychiatric/Behavioral: Negative.   All other systems reviewed and are negative.      Objective:   Physical Exam  Constitutional: She is oriented to person, place, and time. She appears well-developed and well-nourished.  HENT:  Head: Normocephalic and atraumatic.  Neck: Normal range of motion. Neck supple.  Cardiovascular: Normal rate and regular rhythm.  Pulmonary/Chest: Effort normal and breath sounds normal.  Musculoskeletal:  Normal  Muscle Bulk and Muscle Testing  Reveals:  Lumbar Paraspinal Tenderness: L-4-L-5 Lower Extremities: Full ROM and Muscle Strength 5/5 Arises from Table Slowly using Straight Cane for Support Narrow Based Gait  Neurological: She is alert and oriented to person, place, and time.  Skin: Skin is warm and dry.  Psychiatric: She has a normal mood and affect.  Nursing note and vitals reviewed.         Assessment & Plan:  1.Lumbar Postlaminectomy/ Spinal Stenosis Lumbar Region/ Post-Op Pain/Low back pain with radiating symptoms in L3-4 distribution: continues to be limited by pain. Refilled: Continue with current Treatment Regimen : MSIR 15 mg one tablet twice a day as needed for moderate to sever pain andMorphabond 15 mg Q 8 hours.10/18/2017. . We will continue the opioid monitoring program, this consists of regular clinic visits, examinations, urine drug screen, pill counts as well as use of [place] Controlled Substance Reporting System. Encouraged to Continue exercise regime.  2. Lumbar Radiculopathy: No complaints today.Continue to Monitor 10/18/2017 3. Chronic Right Knee Pain:No complaints today. Continue with Ice Therapy and  Voltaren Gel. 10/18/2017 4. Muscle Spasm: Continue current treatment: Tizanidine. 05//06/2017  20 minutes of face to face patient care time was spent during this visit. All questions were encouraged and answered.  F/U in 1 month

## 2017-11-03 ENCOUNTER — Other Ambulatory Visit: Payer: Self-pay | Admitting: Family Medicine

## 2017-11-03 NOTE — Telephone Encounter (Signed)
Flonase 50 mcg/act nasal spray  LOV 04/03/17 with Dr. Tamala Julian  Last refill:  12/11/16   0 refills

## 2017-11-13 ENCOUNTER — Encounter: Payer: Self-pay | Admitting: Family Medicine

## 2017-11-16 ENCOUNTER — Ambulatory Visit (HOSPITAL_BASED_OUTPATIENT_CLINIC_OR_DEPARTMENT_OTHER): Payer: Medicare Other | Admitting: Physical Medicine & Rehabilitation

## 2017-11-16 ENCOUNTER — Encounter: Payer: Self-pay | Admitting: Physical Medicine & Rehabilitation

## 2017-11-16 VITALS — BP 157/92 | HR 57 | Ht 65.0 in | Wt 213.0 lb

## 2017-11-16 DIAGNOSIS — M961 Postlaminectomy syndrome, not elsewhere classified: Secondary | ICD-10-CM | POA: Diagnosis not present

## 2017-11-16 DIAGNOSIS — M48061 Spinal stenosis, lumbar region without neurogenic claudication: Secondary | ICD-10-CM | POA: Diagnosis not present

## 2017-11-16 DIAGNOSIS — M5417 Radiculopathy, lumbosacral region: Secondary | ICD-10-CM | POA: Diagnosis not present

## 2017-11-16 DIAGNOSIS — Z79899 Other long term (current) drug therapy: Secondary | ICD-10-CM

## 2017-11-16 DIAGNOSIS — D171 Benign lipomatous neoplasm of skin and subcutaneous tissue of trunk: Secondary | ICD-10-CM | POA: Diagnosis not present

## 2017-11-16 DIAGNOSIS — Z5181 Encounter for therapeutic drug level monitoring: Secondary | ICD-10-CM

## 2017-11-16 DIAGNOSIS — M5414 Radiculopathy, thoracic region: Secondary | ICD-10-CM | POA: Diagnosis not present

## 2017-11-16 MED ORDER — MORPHINE SULFATE 15 MG PO TABS: 15.0000 mg | ORAL_TABLET | Freq: Two times a day (BID) | ORAL | 0 refills | Status: DC | PRN

## 2017-11-16 MED ORDER — MORPHINE SULFATE ER 15 MG PO T12A: 15.0000 mg | EXTENDED_RELEASE_TABLET | Freq: Three times a day (TID) | ORAL | 0 refills | Status: DC

## 2017-11-16 NOTE — Patient Instructions (Signed)
[facility] will call you to set up evaluation

## 2017-11-16 NOTE — Progress Notes (Addendum)
Subjective:    Patient ID: [name] [name], female    DOB: 1955/09/17, 62 y.o.   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 L2-3, ADJACENT LEVEL LUMBAR SPINAL STENOSIS/DISC DEGENERATION L5-S1,   11/28/2014 THE PATIENT UNDERWENT UNCOMPLICATED BILATERAL LUMBAR DECOMPRESSION L2-SACRUM, METAL REMOVAL/FUSION EXPLORATION L3-4, L4-5 [SOLID FUSION BILATERAL L3-4, L4-5], INSTRUMENTATION/POSTEROLATERAL FUSION L2-3, L5-S1, RIGHT POSTERIOR ILIAC CREST BONE GRAFT Dr Loreen Freud at [place], [place]  She underwent uncomplicated Bilateral re-exploration Lumbar Decompression L-2-3, L3-L4, L-4- 5L5-S1 metal removal (expedium) L2-3, L5-S1 Fusion exploration L4-5, L5-S1 Repair Non-Union with Re instrumentation Left iliac crest bone graftwith Dr. Velna Ochs on 07/22/2016. HPI No new issues except for neck pain and a "knot " on the left side of the neck/shoulder area Patient denies any trauma to that area.  Patient has had this for several years but has been growing larger.  No arm pain or arm numbness on the left side. No fevers chills or weight loss  Lumbar pain is stable.  She continues to use morphine sulfate both long-acting and short acting.  She has had some constipation but this has been managed. No other side effects from medication Pain Inventory Average Pain 7 Pain Right Now 7 My pain is constant and aching  In the last 24 hours, has pain interfered with the following? General activity 7 Relation with others 7 Enjoyment of life 7 What TIME of day is your pain at its worst? all Sleep (in general) Fair  Pain is worse with: walking, bending, sitting, inactivity and standing Pain improves with: rest, heat/ice and medication Relief from Meds:  2  Mobility walk without assistance ability to climb steps?  yes do you drive?  yes  Function disabled: date disabled 2009  Neuro/Psych spasms  Prior Studies Any changes since last visit?  no  Physicians involved in your care Any changes since last visit?  no   Family History  Problem Relation Age of Onset  . Heart disease Mother 37       CHF; CAD  . Hypertension Mother   . Heart failure Mother   . Diabetes Sister   . Lupus Sister   . Kidney failure Sister   . Hypertension Brother   . Hyperlipidemia Sister   . Hypertension Sister   . Colon cancer Neg Hx   . Rectal cancer Neg Hx    Social History   Socioeconomic History  . Marital status: Widowed    Spouse name: Not on file  . Number of children: 3  . Years of education: Not on file  . Highest education level: Not on file  Occupational History  . 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 Financial resource strain: Not on file  . Food insecurity:    Worry: Not on file    Inability: Not on file  . Transportation needs:    Medical: Not on file    Non-medical: Not on file  Tobacco Use  . Smoking status: Former Smoker    Packs/day: 0.50    Years: 20.00    Pack years: 10.00    Types: Cigarettes    Last attempt to quit: 07/21/2008    Years since quitting: 9.3  . Smokeless tobacco: Never Used  Substance and Sexual Activity  . Alcohol use: No  .  Drug use: No  . Sexual activity: Not on file  Lifestyle  . Physical activity:    Days per week: Not on file    Minutes per session: Not on file  . Stress: Not on file  Relationships  . Social connections:    Talks on phone: Not on file    Gets together: Not on file    Attends religious service: Not on file    Active member of club or organization: Not on file    Attends meetings of clubs or organizations: Not on file    Relationship status: Not on file  Other Topics Concern  . Not on file  Social History Narrative   Marital  status: widowed since 2002; not dating which is bad in 2018      Children: 3 children (45, 72, 37); 13 grandchildren; 1 gg      Employment: disability since 2009; retail      Lives: alone in [place] in apartment      Tobacco: quit in 2011      Alcohol: none      Drugs: none      Exercise: minimal in 2018      ADLs: independent with ADLs; drives      Seatbelt: 100%; no texting.   Past Surgical History:  Procedure Laterality Date  . ABDOMINAL HYSTERECTOMY     DUB; ovaries intact  . BREAST BIOPSY    . CARDIAC CATHETERIZATION  1990's   [name]  . COLON SURGERY  05/11/11   [facility] of adhesions Crohn's  . REDUCTION MAMMAPLASTY    . SPINE SURGERY     Past Medical History:  Diagnosis Date  . Blood transfusion without reported diagnosis   . Crohn's disease ([place])   . Disorder of sacroiliac joint   . Hyperlipidemia   . Hypertension   . Ischemic colitis ([place])   . Lumbago   . Lumbar post-laminectomy syndrome   . Lumbosacral neuritis   . Lumbosacral radiculitis   . Sciatica    Ht 5\' 5"  (1.651 m)   Wt 213 lb (96.6 kg)   BMI 35.45 kg/m   Opioid Risk Score:   Fall Risk Score:  `1  Depression screen PHQ 2/9  Depression screen [facility] 2/9 10/18/2017 09/18/2017 07/19/2017 04/03/2017 02/23/2017 01/02/2017 10/25/2016  Decreased Interest 0 0 0 0 0 0 0  Down, Depressed, Hopeless 0 0 3 0 0 0 0  PHQ - 2 Score 0 0 3 0 0 0 0  Altered sleeping - 0 - - - - -  Tired, decreased energy - 0 - - - - -  Change in appetite - 0 - - - - -  Feeling bad or failure about yourself  - 0 - - - - -  Trouble concentrating - 0 - - - - -  Moving slowly or fidgety/restless - 0 - - - - -  Suicidal thoughts - 0 - - - - -  PHQ-9 Score - 0 - - - - -  Difficult doing work/chores - - - - - - -     Review of Systems  Constitutional: Negative.   HENT: Negative.   Eyes: Negative.   Respiratory: Negative.   Cardiovascular: Negative.   Gastrointestinal: Negative.   Endocrine: Negative.   Genitourinary:  Negative.   Musculoskeletal: Positive for arthralgias, back pain and myalgias.  Skin: Negative.   Allergic/Immunologic: Negative.   Neurological: Negative.   Hematological: Negative.   Psychiatric/Behavioral: Negative.   All other  systems reviewed and are negative.      Objective:   Physical Exam  Constitutional: She appears well-developed and well-nourished. No distress.  Skin: She is not diaphoretic.  Nursing note and vitals reviewed.   Left upper trapezius with lipoma approximately 3 x 4 cm, mild Lee tender to palpation, no induration.  Upper extremity strength is 5/5 bilateral deltoid, bicep, tricep, grip  No sensory changes in the upper limbs  Lumbar pain L4-L5 area bilaterally to palpation.  There is mild pain in the PSIS area bilaterally as well. Negative straight leg raising Motor strength is 5/5 bilateral hip flexor knee extensor ankle dorsiflex.     Assessment & Plan:  1.  Lumbar postlaminectomy syndrome she has multiple complex lumbar decompression and fusion procedures with reexploration last performed in February 2018.  She is fused from L2 through sacrum Patient has been on a stable pain medication regimen.  She has had no side effects no signs of abuse.  We will follow-up next month with nurse practitioner, update controlled substance agreement and opioid consent.  Will do urine screen  MSIR 50 mg twice daily Morphabond 15 mg 3 times daily Tizanidine 2 mg twice daily  Reviewed [place] SR/PMP aware website no red flags  2.  Left upper trapezius mass appears to be most likely lipoma will send to general surgery given that it is getting larger and has become painful

## 2017-11-21 ENCOUNTER — Other Ambulatory Visit: Payer: Self-pay | Admitting: *Deleted

## 2017-11-21 MED ORDER — NALOXONE HCL 4 MG/0.1ML NA LIQD: NASAL | 0 refills | Status: DC

## 2017-11-23 LAB — 6-ACETYLMORPHINE,TOXASSURE ADD
6-ACETYLMORPHINE: NEGATIVE
6-acetylmorphine: NOT DETECTED ng/mg creat

## 2017-11-23 LAB — TOXASSURE SELECT,+ANTIDEPR,UR

## 2017-11-26 ENCOUNTER — Other Ambulatory Visit: Payer: Self-pay | Admitting: Gastroenterology

## 2017-11-28 ENCOUNTER — Telehealth: Payer: Self-pay | Admitting: *Deleted

## 2017-11-28 NOTE — Telephone Encounter (Signed)
Urine drug screen for this encounter is consistent for prescribed medication 

## 2017-12-01 ENCOUNTER — Ambulatory Visit: Payer: Self-pay | Admitting: Surgery

## 2017-12-01 DIAGNOSIS — D171 Benign lipomatous neoplasm of skin and subcutaneous tissue of trunk: Secondary | ICD-10-CM | POA: Diagnosis not present

## 2017-12-01 NOTE — H&P (Signed)
[name] [name] Documented: 12/01/2017 9:09 AM Location: [facility] Patient #: [id number] DOB: [birth year] Widowed / Language: [place] / Race: Black or African American Female  History of Present Illness [name] Moores A. [name] MD; 12/01/2017 9:32 AM) Patient words: Patient sent request of Dr. Alysia [name] for a mass over her left upper back. It is present for many years. He is begun to grow and get larger. It is causing discomfort especially she lays on her puts her head backwards. It is a shooting pain from this area sharp in nature made worse with pressure or movement. There is no redness or drainage associated with it.  The patient is a 62 year old female.   Problem List/Past Medical ([name] Tackett A. [name], MD; 12/01/2017 9:35 AM) LIPOMA OF BACK (D17.1)  Allergies [name] [name]; 12/01/2017 9:10 AM) [name] [name] *COUGH/COLD/ALLERGY*  Medication History [name] [name]; 12/01/2017 9:11 AM) Diclofenac Sodium (1% Gel, Transdermal) Active. HydroCHLOROthiazide (25MG  Tablet, Oral) Active. Morphine Sulfate (15MG  Tablet, Oral) Active. Prempro (0.3-1.5MG  Tablet, Oral) Active. Fluticasone Propionate (50MCG/ACT Suspension, Nasal) Active. Narcan (4MG /0.1ML Liquid, Nasal) Active. Medications Reconciled     Review of Systems ([name] Macho A. [name] MD; 12/01/2017 9:34 AM) All other systems negative  Vitals [name] [name]; 12/01/2017 9:12 AM) 12/01/2017 9:11 AM Weight: 213.25 lb Height: 64in Body Surface Area: 2.01 m Body Mass Index: 36.6 kg/m  Temp.: 98.57F(Oral)  Pulse: 80 (Regular)  BP: 132/82 (Sitting, Left Arm, Standard)      Physical Exam ([name] Lohnes A. [name] MD; 12/01/2017 9:33 AM)  General Mental Status-Alert. General Appearance-Consistent with stated age. Hydration-Well hydrated. Voice-Normal.  Integumentary Note: Over the left trapezius muscle was a 5 cm mass. It is smooth and well demarcated. It appears deep muscle. It is tender to touch.  There is no redness or fluctuance.  Head and Neck Head-normocephalic, atraumatic with no lesions or palpable masses. Trachea-midline. Thyroid Gland Characteristics - normal size and consistency.  Chest and Lung Exam Chest and lung exam reveals -quiet, even and easy respiratory effort with no use of accessory muscles and on auscultation, normal breath sounds, no adventitious sounds and normal vocal resonance. Inspection Chest Wall - Normal. Back - normal.  Cardiovascular Cardiovascular examination reveals -normal heart sounds, regular rate and rhythm with no murmurs and normal pedal pulses bilaterally.  Neurologic Neurologic evaluation reveals -alert and oriented x 3 with no impairment of recent or remote memory. Mental Status-Normal.  Musculoskeletal Normal Exam - Left-Upper Extremity Strength Normal and Lower Extremity Strength Normal. Normal Exam - Right-Upper Extremity Strength Normal and Lower Extremity Strength Normal.    Assessment & Plan ([name] Goldman A. [name] MD; 12/01/2017 9:34 AM)  LIPOMA OF BACK (D17.1) Impression: 5 cm over left trapezius fixed check preoperative MRI SET UP FOR EXCISION DUE TO PAIN Patient on chronic narcotics followed closely for that. There is no evidence of colic. Postoperative pain management information given to the patient.  Current Plans The pathophysiology of skin & subcutaneous masses was discussed. Natural history risks without surgery were discussed. I recommended surgery to remove the mass. I explained the technique of removal with use of local anesthesia & possible need for more aggressive sedation/anesthesia for patient comfort.  Risks such as bleeding, infection, wound breakdown, heart attack, death, and other risks were discussed. I noted a good likelihood this will help address the problem. Possibility that this will not correct all symptoms was explained. Possibility of regrowth/recurrence of the mass was  discussed. We will work to minimize complications. Questions were answered. The patient expresses understanding & wishes to  proceed with surgery.  Pt Education - [facility] Free Text Education/Instructions: discussed with patient and provided information. Pt Education - [facility] General Post-op HCI

## 2017-12-12 ENCOUNTER — Other Ambulatory Visit: Payer: Self-pay | Admitting: Surgery

## 2017-12-12 DIAGNOSIS — D171 Benign lipomatous neoplasm of skin and subcutaneous tissue of trunk: Secondary | ICD-10-CM

## 2017-12-13 ENCOUNTER — Other Ambulatory Visit: Payer: Self-pay | Admitting: Family Medicine

## 2017-12-13 DIAGNOSIS — I10 Essential (primary) hypertension: Secondary | ICD-10-CM

## 2017-12-13 NOTE — Telephone Encounter (Signed)
Refill request for hctz 25 mg #30 with no refills approved.  Pt needs ov asap f/u htn/labs. No more refills without scheduled and kept appt.  Will send to schedulers to call pt and schedule f/u appt with [facility]. Dgaddy, CMA

## 2017-12-14 ENCOUNTER — Encounter: Payer: Self-pay | Admitting: Registered Nurse

## 2017-12-14 ENCOUNTER — Encounter: Payer: Medicare Other | Attending: Physical Medicine & Rehabilitation | Admitting: Registered Nurse

## 2017-12-14 ENCOUNTER — Ambulatory Visit: Payer: Medicare Other | Admitting: Registered Nurse

## 2017-12-14 VITALS — BP 151/82 | HR 64 | Resp 14 | Ht 64.0 in | Wt 213.0 lb

## 2017-12-14 DIAGNOSIS — Z5181 Encounter for therapeutic drug level monitoring: Secondary | ICD-10-CM | POA: Diagnosis not present

## 2017-12-14 DIAGNOSIS — D171 Benign lipomatous neoplasm of skin and subcutaneous tissue of trunk: Secondary | ICD-10-CM | POA: Diagnosis not present

## 2017-12-14 DIAGNOSIS — M5417 Radiculopathy, lumbosacral region: Secondary | ICD-10-CM | POA: Insufficient documentation

## 2017-12-14 DIAGNOSIS — Z79899 Other long term (current) drug therapy: Secondary | ICD-10-CM | POA: Diagnosis not present

## 2017-12-14 DIAGNOSIS — Z79891 Long term (current) use of opiate analgesic: Secondary | ICD-10-CM | POA: Diagnosis not present

## 2017-12-14 DIAGNOSIS — M5414 Radiculopathy, thoracic region: Secondary | ICD-10-CM | POA: Diagnosis not present

## 2017-12-14 DIAGNOSIS — M961 Postlaminectomy syndrome, not elsewhere classified: Secondary | ICD-10-CM | POA: Diagnosis not present

## 2017-12-14 DIAGNOSIS — G894 Chronic pain syndrome: Secondary | ICD-10-CM | POA: Diagnosis not present

## 2017-12-14 DIAGNOSIS — M62838 Other muscle spasm: Secondary | ICD-10-CM

## 2017-12-14 DIAGNOSIS — M48061 Spinal stenosis, lumbar region without neurogenic claudication: Secondary | ICD-10-CM

## 2017-12-14 MED ORDER — MORPHINE SULFATE 15 MG PO TABS: 15.0000 mg | ORAL_TABLET | Freq: Two times a day (BID) | ORAL | 0 refills | Status: DC | PRN

## 2017-12-14 MED ORDER — MORPHINE SULFATE ER 15 MG PO T12A: 15.0000 mg | EXTENDED_RELEASE_TABLET | Freq: Three times a day (TID) | ORAL | 0 refills | Status: DC

## 2017-12-14 NOTE — Progress Notes (Signed)
Subjective:    Patient ID: [name] [name], female    DOB: 04-Aug-1955, 62 y.o.   MRN: [id number]  HPI: [name] is a 62 year old female who returns for follow up for chronic pain and medication refill. She states her pain is located in her upper back mainly left side and lower back mainly left side. She rates her pain 8. Her current exercise regime is walking and performing stretching exercises.   [name] [name] seen Dr. Brantley Stage she is scheduled for MRI regarding her Lipoma  [name] [name] Morphine Equivalent is 75.00 MME.  Last UDS was Performed on 11/16/2017, it was consistent.    Pain Inventory Average Pain 8 Pain Right Now 8 My pain is constant, sharp and aching  In the last 24 hours, has pain interfered with the following? General activity 8 Relation with others 8 Enjoyment of life 8 What TIME of day is your pain at its worst? all Sleep (in general) Poor  Pain is worse with: walking, bending, sitting, inactivity and standing Pain improves with: rest, heat/ice and medication Relief from Meds: 1  Mobility walk without assistance how many minutes can you walk? 10 ability to climb steps?  yes do you drive?  yes  Function not employed: date last employed 03/08 disabled: date disabled 05/09 I need assistance with the following:  household duties and shopping Do you have any goals in this area?  yes  Neuro/Psych spasms  Prior Studies Any changes since last visit?  no  Physicians involved in your care Any changes since last visit?  no   Family History  Problem Relation Age of Onset  . Heart disease Mother 85       CHF; CAD  . Hypertension Mother   . Heart failure Mother   . Diabetes Sister   . Lupus Sister   . Kidney failure Sister   . Hypertension Brother   . Hyperlipidemia Sister   . Hypertension Sister   . Colon cancer Neg Hx   . Rectal cancer Neg Hx    Social History   Socioeconomic History  . Marital status: Widowed    Spouse name: Not  on file  . Number of children: 3  . Years of education: Not on file  . Highest education level: Not on file  Occupational History  . 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 Financial resource strain: Not on file  . Food insecurity:    Worry: Not on file    Inability: Not on file  . Transportation needs:    Medical: Not on file    Non-medical: Not on file  Tobacco Use  . Smoking status: Former Smoker    Packs/day: 0.50    Years: 20.00    Pack years: 10.00    Types: Cigarettes    Last attempt to quit: 07/21/2008    Years since quitting: 9.4  . Smokeless tobacco: Never Used  Substance and Sexual Activity  . Alcohol use: No  . Drug use: No  . Sexual activity: Not on file  Lifestyle  . Physical activity:    Days per week: Not on file    Minutes per session: Not on file  . Stress: Not on file  Relationships  . Social connections:    Talks on phone: Not on file    Gets together: Not on file    Attends religious service: Not on file    Active member of  club or organization: Not on file    Attends meetings of clubs or organizations: Not on file    Relationship status: Not on file  Other Topics Concern  . Not on file  Social History Narrative   Marital status: widowed since 2002; not dating which is bad in 2018      Children: 3 children (45, 62, 61); 13 grandchildren; 1 gg      Employment: disability since 2009; retail      Lives: alone in [place] in apartment      Tobacco: quit in 2011      Alcohol: none      Drugs: none      Exercise: minimal in 2018      ADLs: independent with ADLs; drives      Seatbelt: 100%; no texting.   Past Surgical History:  Procedure Laterality Date  . ABDOMINAL HYSTERECTOMY     DUB; ovaries intact  . BREAST BIOPSY    . CARDIAC CATHETERIZATION  1990's   [name]  . COLON SURGERY  05/11/11   [facility] L[facility] of adhesions Crohn's  . REDUCTION MAMMAPLASTY    . SPINE SURGERY     Past Medical History:    Diagnosis Date  . Blood transfusion without reported diagnosis   . Crohn's disease ([place])   . Disorder of sacroiliac joint   . Hyperlipidemia   . Hypertension   . Ischemic colitis ([place])   . Lumbago   . Lumbar post-laminectomy syndrome   . Lumbosacral neuritis   . Lumbosacral radiculitis   . Sciatica    BP (!) 151/82 (BP Location: Right Arm, Patient Position: Sitting, Cuff Size: Large)   Pulse 64   Resp 14   Ht 5\' 4"  (1.626 m)   Wt 213 lb (96.6 kg)   SpO2 97%   BMI 36.56 kg/m   Opioid Risk Score:   Fall Risk Score:  `1  Depression screen PHQ 2/9  Depression screen [facility] 2/9 10/18/2017 09/18/2017 07/19/2017 04/03/2017 02/23/2017 01/02/2017 10/25/2016  Decreased Interest 0 0 0 0 0 0 0  Down, Depressed, Hopeless 0 0 3 0 0 0 0  PHQ - 2 Score 0 0 3 0 0 0 0  Altered sleeping - 0 - - - - -  Tired, decreased energy - 0 - - - - -  Change in appetite - 0 - - - - -  Feeling bad or failure about yourself  - 0 - - - - -  Trouble concentrating - 0 - - - - -  Moving slowly or fidgety/restless - 0 - - - - -  Suicidal thoughts - 0 - - - - -  PHQ-9 Score - 0 - - - - -  Difficult doing work/chores - - - - - - -    Review of Systems  Constitutional: Negative.   HENT: Negative.   Eyes: Negative.   Respiratory: Negative.   Cardiovascular: Negative.   Gastrointestinal: Negative.   Endocrine: Negative.   Genitourinary: Negative.   Musculoskeletal: Positive for back pain and neck pain.       Spasms  Skin: Negative.   Allergic/Immunologic: Negative.   Neurological: Negative.   Hematological: Negative.   Psychiatric/Behavioral: Negative.   All other systems reviewed and are negative.      Objective:   Physical Exam  Constitutional: She is oriented to person, place, and time. She appears well-developed and well-nourished.  HENT:  Head: Normocephalic and atraumatic.  Neck: Normal range of motion. Neck supple.  Cardiovascular: Normal rate and regular rhythm.  Pulmonary/Chest: Effort normal  and breath sounds normal.  Musculoskeletal:  Normal Muscle Bulk and Muscle Testing Reveals: Upper Extremities: Full ROM and Muscle Strength 5/5 Left: Trapezia area with Tenderness Noted ( Lipoma) Lumbar Paraspinal Tenderness: L-3-L-5 Mainly Left Side Lower Extremities: Full ROM and Muscle Strength 5/5 Arises from Table with ease Narrow Based Gait  Neurological: She is alert and oriented to person, place, and time.  Skin: Skin is warm and dry.  Psychiatric: She has a normal mood and affect. Her behavior is normal.  Nursing note and vitals reviewed.         Assessment & Plan:  1.Lumbar Postlaminectomy/ Spinal Stenosis Lumbar Region/ Post-Op Pain/Low back pain with radiating symptoms in L3-4 distribution: continues to be limited by pain. Refilled: Continue with current Treatment Regimen :MSIR 15 mg one tablet twice a day as needed for moderate to sever pain andMorphabond 15 mg Q 8 hours.12/14/2017. . We will continue the opioid monitoring program, this consists of regular clinic visits, examinations, urine drug screen, pill counts as well as use of [place] Controlled Substance Reporting System. Encouraged to Continue exercise regime.  2. Lumbar Radiculopathy: No complaints today.Continue to Monitor 12/14/2017 3. Chronic Right Knee Pain:No complaints today. Continue with Ice TherapyandVoltaren Gel. 12/14/2017 4. Muscle Spasm: Continuecurrent treatment: Tizanidine. 06//27/2019 5. Lipoma: Dr. Brantley Stage following, she's scheduled for MRI on 12/25/2017.  20 minutes of face to face patient care time was spent during this visit. All questions were encouraged and answered.  F/U in 1 month

## 2017-12-16 LAB — DRUG TOX MONITOR 1 W/CONF, ORAL FLD
AMPHETAMINES: NEGATIVE ng/mL (ref <10)
BARBITURATES: NEGATIVE ng/mL (ref <10)
Benzodiazepines: NEGATIVE ng/mL (ref <0.50)
Buprenorphine: NEGATIVE ng/mL (ref <0.10)
COTININE: 218.5 ng/mL — AB (ref <5.0)
Cocaine: NEGATIVE ng/mL (ref <5.0)
Codeine: NEGATIVE ng/mL (ref <2.5)
Dihydrocodeine: NEGATIVE ng/mL (ref <2.5)
Fentanyl: NEGATIVE ng/mL (ref <0.10)
HYDROCODONE: NEGATIVE ng/mL (ref <2.5)
HYDROMORPHONE: NEGATIVE ng/mL (ref <2.5)
Heroin Metabolite: NEGATIVE ng/mL (ref <1.0)
MARIJUANA: NEGATIVE ng/mL (ref <2.5)
MDMA: NEGATIVE ng/mL (ref <10)
Meprobamate: NEGATIVE ng/mL (ref <2.5)
Methadone: NEGATIVE ng/mL (ref <5.0)
Morphine: 37.2 ng/mL — ABNORMAL HIGH (ref <2.5)
Nicotine Metabolite: POSITIVE ng/mL — AB (ref <5.0)
Norhydrocodone: NEGATIVE ng/mL (ref <2.5)
Noroxycodone: NEGATIVE ng/mL (ref <2.5)
OXYMORPHONE: NEGATIVE ng/mL (ref <2.5)
Opiates: POSITIVE ng/mL — AB (ref <2.5)
Oxycodone: NEGATIVE ng/mL (ref <2.5)
Phencyclidine: NEGATIVE ng/mL (ref <10)
TRAMADOL: NEGATIVE ng/mL (ref <5.0)
Tapentadol: NEGATIVE ng/mL (ref <5.0)
ZOLPIDEM: NEGATIVE ng/mL (ref <5.0)

## 2017-12-16 LAB — DRUG TOX ALC METAB W/CON, ORAL FLD: Alcohol Metabolite: NEGATIVE ng/mL (ref <25)

## 2017-12-18 ENCOUNTER — Telehealth: Payer: Self-pay | Admitting: *Deleted

## 2017-12-18 ENCOUNTER — Ambulatory Visit: Payer: Medicare Other | Admitting: Registered Nurse

## 2017-12-18 NOTE — Telephone Encounter (Signed)
Oral swab drug screen was consistent for prescribed medications.  ?

## 2017-12-21 ENCOUNTER — Other Ambulatory Visit: Payer: Self-pay | Admitting: Gastroenterology

## 2017-12-25 ENCOUNTER — Ambulatory Visit
Admission: RE | Admit: 2017-12-25 | Discharge: 2017-12-25 | Disposition: A | Discharge status: Home / Self Care | Payer: Medicare Other | Source: Ambulatory Visit | Attending: Surgery | Admitting: Surgery

## 2017-12-25 DIAGNOSIS — R222 Localized swelling, mass and lump, trunk: Secondary | ICD-10-CM | POA: Diagnosis not present

## 2017-12-25 DIAGNOSIS — D171 Benign lipomatous neoplasm of skin and subcutaneous tissue of trunk: Secondary | ICD-10-CM

## 2017-12-25 MED ORDER — GADOBENATE DIMEGLUMINE 529 MG/ML IV SOLN
20.0000 mL | Freq: Once | INTRAVENOUS | Status: AC | PRN
  Administered 2017-12-25: 20 mL via INTRAVENOUS

## 2017-12-27 ENCOUNTER — Other Ambulatory Visit: Payer: Self-pay | Admitting: Family Medicine

## 2017-12-28 ENCOUNTER — Encounter (HOSPITAL_BASED_OUTPATIENT_CLINIC_OR_DEPARTMENT_OTHER): Payer: Self-pay | Admitting: *Deleted

## 2018-01-01 ENCOUNTER — Encounter (HOSPITAL_BASED_OUTPATIENT_CLINIC_OR_DEPARTMENT_OTHER)
Admission: RE | Admit: 2018-01-01 | Discharge: 2018-01-01 | Disposition: A | Discharge status: Home / Self Care | Payer: Medicare Other | Source: Ambulatory Visit | Attending: Surgery | Admitting: Surgery

## 2018-01-01 DIAGNOSIS — Z791 Long term (current) use of non-steroidal anti-inflammatories (NSAID): Secondary | ICD-10-CM | POA: Diagnosis not present

## 2018-01-01 DIAGNOSIS — R001 Bradycardia, unspecified: Secondary | ICD-10-CM

## 2018-01-01 DIAGNOSIS — Z01810 Encounter for preprocedural cardiovascular examination: Secondary | ICD-10-CM | POA: Insufficient documentation

## 2018-01-01 DIAGNOSIS — Z87891 Personal history of nicotine dependence: Secondary | ICD-10-CM | POA: Diagnosis not present

## 2018-01-01 DIAGNOSIS — Z79890 Hormone replacement therapy (postmenopausal): Secondary | ICD-10-CM | POA: Diagnosis not present

## 2018-01-01 DIAGNOSIS — D171 Benign lipomatous neoplasm of skin and subcutaneous tissue of trunk: Secondary | ICD-10-CM

## 2018-01-01 DIAGNOSIS — I10 Essential (primary) hypertension: Secondary | ICD-10-CM | POA: Diagnosis not present

## 2018-01-01 DIAGNOSIS — Z79891 Long term (current) use of opiate analgesic: Secondary | ICD-10-CM | POA: Diagnosis not present

## 2018-01-01 DIAGNOSIS — Z01818 Encounter for other preprocedural examination: Secondary | ICD-10-CM

## 2018-01-01 DIAGNOSIS — Z888 Allergy status to other drugs, medicaments and biological substances status: Secondary | ICD-10-CM | POA: Diagnosis not present

## 2018-01-01 DIAGNOSIS — Z79899 Other long term (current) drug therapy: Secondary | ICD-10-CM | POA: Diagnosis not present

## 2018-01-01 LAB — COMPREHENSIVE METABOLIC PANEL
ALBUMIN: 3.9 g/dL (ref 3.5–5.0)
ALT: 15 U/L (ref 0–44)
ANION GAP: 6 (ref 5–15)
AST: 22 U/L (ref 15–41)
Alkaline Phosphatase: 75 U/L (ref 38–126)
BILIRUBIN TOTAL: 0.9 mg/dL (ref 0.3–1.2)
BUN: 9 mg/dL (ref 8–23)
CO2: 28 mmol/L (ref 22–32)
Calcium: 9.1 mg/dL (ref 8.9–10.3)
Chloride: 106 mmol/L (ref 98–111)
Creatinine, Ser: 0.93 mg/dL (ref 0.44–1.00)
Glucose, Bld: 76 mg/dL (ref 70–99)
POTASSIUM: 3.8 mmol/L (ref 3.5–5.1)
Sodium: 140 mmol/L (ref 135–145)
TOTAL PROTEIN: 7.3 g/dL (ref 6.5–8.1)

## 2018-01-01 NOTE — Progress Notes (Addendum)
Dr. Royce Macadamia reviewed EKG, compared previous one- ok for surgery. Pt given Ensure and instructed to drink by 0800 day of surgery with teach back method. Pt stuck a total of 3 times to get labs ( BMET) and unable to get CBC, Diff - difficult stick. Will need CBC and diff day of surgery.

## 2018-01-01 NOTE — Progress Notes (Signed)
Patient in for PAT and it was unfortunately very difficult to draw blood on her. We had a small sample for ISTAT but specimen hemolyzed and false potassium result. Another nurse was able to get BMET tube but unable to get CBC tube filled. Will get try to get CBC specimen with IV start on day of surgery. Awaiting results from BMET. Dr Royce Macadamia aware.

## 2018-01-03 ENCOUNTER — Ambulatory Visit (HOSPITAL_BASED_OUTPATIENT_CLINIC_OR_DEPARTMENT_OTHER): Payer: Medicare Other | Admitting: Anesthesiology

## 2018-01-03 ENCOUNTER — Encounter (HOSPITAL_BASED_OUTPATIENT_CLINIC_OR_DEPARTMENT_OTHER)
Admission: RE | Disposition: A | Discharge status: Home / Self Care | Payer: Self-pay | Source: Ambulatory Visit | Attending: Surgery

## 2018-01-03 ENCOUNTER — Ambulatory Visit (HOSPITAL_BASED_OUTPATIENT_CLINIC_OR_DEPARTMENT_OTHER)
Admission: RE | Admit: 2018-01-03 | Discharge: 2018-01-04 | Disposition: A | Discharge status: Home / Self Care | Payer: Medicare Other | Source: Ambulatory Visit | Attending: Surgery | Admitting: Surgery

## 2018-01-03 ENCOUNTER — Encounter (HOSPITAL_BASED_OUTPATIENT_CLINIC_OR_DEPARTMENT_OTHER): Payer: Self-pay | Admitting: Anesthesiology

## 2018-01-03 ENCOUNTER — Other Ambulatory Visit: Payer: Self-pay | Admitting: Family Medicine

## 2018-01-03 ENCOUNTER — Telehealth: Payer: Self-pay | Admitting: *Deleted

## 2018-01-03 ENCOUNTER — Other Ambulatory Visit: Payer: Self-pay

## 2018-01-03 DIAGNOSIS — I10 Essential (primary) hypertension: Secondary | ICD-10-CM | POA: Insufficient documentation

## 2018-01-03 DIAGNOSIS — Z79899 Other long term (current) drug therapy: Secondary | ICD-10-CM | POA: Insufficient documentation

## 2018-01-03 DIAGNOSIS — Z79890 Hormone replacement therapy (postmenopausal): Secondary | ICD-10-CM | POA: Diagnosis not present

## 2018-01-03 DIAGNOSIS — Z87891 Personal history of nicotine dependence: Secondary | ICD-10-CM | POA: Diagnosis not present

## 2018-01-03 DIAGNOSIS — R222 Localized swelling, mass and lump, trunk: Secondary | ICD-10-CM | POA: Diagnosis not present

## 2018-01-03 DIAGNOSIS — Z79891 Long term (current) use of opiate analgesic: Secondary | ICD-10-CM | POA: Insufficient documentation

## 2018-01-03 DIAGNOSIS — Z888 Allergy status to other drugs, medicaments and biological substances status: Secondary | ICD-10-CM | POA: Insufficient documentation

## 2018-01-03 DIAGNOSIS — D171 Benign lipomatous neoplasm of skin and subcutaneous tissue of trunk: Secondary | ICD-10-CM | POA: Insufficient documentation

## 2018-01-03 DIAGNOSIS — Z791 Long term (current) use of non-steroidal anti-inflammatories (NSAID): Secondary | ICD-10-CM | POA: Diagnosis not present

## 2018-01-03 HISTORY — PX: MASS EXCISION: SHX2000

## 2018-01-03 LAB — POCT HEMOGLOBIN-HEMACUE: Hemoglobin: 14.4 g/dL (ref 12.0–15.0)

## 2018-01-03 SURGERY — EXCISION MASS
Anesthesia: General | Site: Back | Laterality: Left

## 2018-01-03 MED ORDER — FENTANYL CITRATE (PF) 100 MCG/2ML IJ SOLN
INTRAMUSCULAR | Status: AC
  Filled 2018-01-03: qty 2

## 2018-01-03 MED ORDER — ONDANSETRON HCL 4 MG/2ML IJ SOLN: 4.0000 mg | Freq: Once | INTRAMUSCULAR | Status: DC | PRN

## 2018-01-03 MED ORDER — ACETAMINOPHEN 650 MG RE SUPP: 650.0000 mg | RECTAL | Status: DC | PRN

## 2018-01-03 MED ORDER — BUPIVACAINE-EPINEPHRINE 0.25% -1:200000 IJ SOLN
INTRAMUSCULAR | Status: DC | PRN
  Administered 2018-01-03: 10 mL

## 2018-01-03 MED ORDER — CHLORHEXIDINE GLUCONATE CLOTH 2 % EX PADS: 6.0000 | MEDICATED_PAD | Freq: Once | CUTANEOUS | Status: DC

## 2018-01-03 MED ORDER — MIDAZOLAM HCL 2 MG/2ML IJ SOLN
INTRAMUSCULAR | Status: AC
  Filled 2018-01-03: qty 2

## 2018-01-03 MED ORDER — LIDOCAINE HCL (CARDIAC) PF 100 MG/5ML IV SOSY
PREFILLED_SYRINGE | INTRAVENOUS | Status: DC | PRN
  Administered 2018-01-03: 50 mg via INTRAVENOUS

## 2018-01-03 MED ORDER — ONDANSETRON HCL 4 MG/2ML IJ SOLN
INTRAMUSCULAR | Status: AC
  Filled 2018-01-03: qty 2

## 2018-01-03 MED ORDER — ACETAMINOPHEN 500 MG PO TABS
ORAL_TABLET | ORAL | Status: AC
  Filled 2018-01-03: qty 2

## 2018-01-03 MED ORDER — ONDANSETRON HCL 4 MG/2ML IJ SOLN
INTRAMUSCULAR | Status: DC | PRN
  Administered 2018-01-03: 4 mg via INTRAVENOUS

## 2018-01-03 MED ORDER — CEFAZOLIN SODIUM-DEXTROSE 2-4 GM/100ML-% IV SOLN
2.0000 g | INTRAVENOUS | Status: AC
  Administered 2018-01-03: 2 g via INTRAVENOUS

## 2018-01-03 MED ORDER — SCOPOLAMINE 1 MG/3DAYS TD PT72: 1.0000 | MEDICATED_PATCH | Freq: Once | TRANSDERMAL | Status: DC | PRN

## 2018-01-03 MED ORDER — CEFAZOLIN SODIUM-DEXTROSE 2-4 GM/100ML-% IV SOLN
INTRAVENOUS | Status: AC
  Filled 2018-01-03: qty 100

## 2018-01-03 MED ORDER — GABAPENTIN 300 MG PO CAPS
300.0000 mg | ORAL_CAPSULE | ORAL | Status: AC
  Administered 2018-01-03: 300 mg via ORAL

## 2018-01-03 MED ORDER — FENTANYL CITRATE (PF) 100 MCG/2ML IJ SOLN
INTRAMUSCULAR | Status: DC | PRN
  Administered 2018-01-03: 100 ug via INTRAVENOUS

## 2018-01-03 MED ORDER — FENTANYL CITRATE (PF) 100 MCG/2ML IJ SOLN: 50.0000 ug | INTRAMUSCULAR | Status: DC | PRN

## 2018-01-03 MED ORDER — DEXAMETHASONE SODIUM PHOSPHATE 4 MG/ML IJ SOLN
INTRAMUSCULAR | Status: DC | PRN
  Administered 2018-01-03: 10 mg via INTRAVENOUS

## 2018-01-03 MED ORDER — BUPIVACAINE-EPINEPHRINE (PF) 0.25% -1:200000 IJ SOLN
INTRAMUSCULAR | Status: AC
  Filled 2018-01-03: qty 30

## 2018-01-03 MED ORDER — LIDOCAINE HCL (CARDIAC) PF 100 MG/5ML IV SOSY
PREFILLED_SYRINGE | INTRAVENOUS | Status: AC
  Filled 2018-01-03: qty 5

## 2018-01-03 MED ORDER — OXYCODONE HCL 5 MG PO TABS
5.0000 mg | ORAL_TABLET | ORAL | Status: DC | PRN
  Administered 2018-01-03 – 2018-01-04 (×3): 10 mg via ORAL
  Filled 2018-01-03 (×3): qty 2

## 2018-01-03 MED ORDER — GABAPENTIN 300 MG PO CAPS
ORAL_CAPSULE | ORAL | Status: AC
  Filled 2018-01-03: qty 1

## 2018-01-03 MED ORDER — SODIUM CHLORIDE 0.9% FLUSH: 3.0000 mL | INTRAVENOUS | Status: DC | PRN

## 2018-01-03 MED ORDER — MIDAZOLAM HCL 2 MG/2ML IJ SOLN: 1.0000 mg | INTRAMUSCULAR | Status: DC | PRN

## 2018-01-03 MED ORDER — MORPHINE SULFATE 15 MG PO TABS: 15.0000 mg | ORAL_TABLET | ORAL | Status: DC | PRN

## 2018-01-03 MED ORDER — SUCCINYLCHOLINE CHLORIDE 20 MG/ML IJ SOLN
INTRAMUSCULAR | Status: DC | PRN
  Administered 2018-01-03: 50 mg via INTRAVENOUS

## 2018-01-03 MED ORDER — ACETAMINOPHEN 500 MG PO TABS
1000.0000 mg | ORAL_TABLET | Freq: Four times a day (QID) | ORAL | Status: DC
  Administered 2018-01-04: 1000 mg via ORAL
  Filled 2018-01-03: qty 2

## 2018-01-03 MED ORDER — MIDAZOLAM HCL 5 MG/5ML IJ SOLN
INTRAMUSCULAR | Status: DC | PRN
  Administered 2018-01-03: 2 mg via INTRAVENOUS

## 2018-01-03 MED ORDER — HYDROCHLOROTHIAZIDE 25 MG PO TABS
25.0000 mg | ORAL_TABLET | Freq: Every day | ORAL | Status: DC
  Administered 2018-01-03: 25 mg via ORAL
  Filled 2018-01-03: qty 1

## 2018-01-03 MED ORDER — ACETAMINOPHEN 325 MG PO TABS: 650.0000 mg | ORAL_TABLET | ORAL | Status: DC | PRN

## 2018-01-03 MED ORDER — SODIUM CHLORIDE 0.9% FLUSH: 3.0000 mL | Freq: Two times a day (BID) | INTRAVENOUS | Status: DC

## 2018-01-03 MED ORDER — ACETAMINOPHEN 500 MG PO TABS
1000.0000 mg | ORAL_TABLET | ORAL | Status: AC
  Administered 2018-01-03: 1000 mg via ORAL

## 2018-01-03 MED ORDER — PROPOFOL 10 MG/ML IV BOLUS
INTRAVENOUS | Status: DC | PRN
  Administered 2018-01-03: 200 mg via INTRAVENOUS

## 2018-01-03 MED ORDER — DEXAMETHASONE SODIUM PHOSPHATE 10 MG/ML IJ SOLN
INTRAMUSCULAR | Status: AC
  Filled 2018-01-03: qty 1

## 2018-01-03 MED ORDER — PROPOFOL 10 MG/ML IV BOLUS
INTRAVENOUS | Status: AC
  Filled 2018-01-03: qty 20

## 2018-01-03 MED ORDER — MORPHINE SULFATE (PF) 2 MG/ML IV SOLN: 2.0000 mg | INTRAVENOUS | Status: DC | PRN

## 2018-01-03 MED ORDER — SODIUM CHLORIDE 0.9 % IV SOLN
250.0000 mL | INTRAVENOUS | Status: DC | PRN
  Administered 2018-01-03: 250 mL via INTRAVENOUS

## 2018-01-03 MED ORDER — LACTATED RINGERS IV SOLN
INTRAVENOUS | Status: DC
  Administered 2018-01-03: 500 mL via INTRAVENOUS
  Administered 2018-01-03: 11:00:00 via INTRAVENOUS

## 2018-01-03 MED ORDER — FENTANYL CITRATE (PF) 100 MCG/2ML IJ SOLN
25.0000 ug | INTRAMUSCULAR | Status: DC | PRN
  Administered 2018-01-03 (×2): 50 ug via INTRAVENOUS
  Administered 2018-01-03: 25 ug via INTRAVENOUS

## 2018-01-03 SURGICAL SUPPLY — 46 items
ADH SKN CLS APL DERMABOND .7 (GAUZE/BANDAGES/DRESSINGS) ×1
APL SKNCLS STERI-STRIP NONHPOA (GAUZE/BANDAGES/DRESSINGS)
BENZOIN TINCTURE PRP APPL 2/3 (GAUZE/BANDAGES/DRESSINGS) IMPLANT
BLADE SURG 10 STRL SS (BLADE) IMPLANT
BLADE SURG 15 STRL LF DISP TIS (BLADE) ×1 IMPLANT
BLADE SURG 15 STRL SS (BLADE) ×3
CANISTER SUCT 1200ML W/VALVE (MISCELLANEOUS) IMPLANT
CHLORAPREP W/TINT 26ML (MISCELLANEOUS) ×3 IMPLANT
CLOSURE WOUND 1/2 X4 (GAUZE/BANDAGES/DRESSINGS)
COVER BACK TABLE 60X90IN (DRAPES) ×3 IMPLANT
COVER MAYO STAND STRL (DRAPES) ×3 IMPLANT
DECANTER SPIKE VIAL GLASS SM (MISCELLANEOUS) IMPLANT
DERMABOND ADVANCED (GAUZE/BANDAGES/DRESSINGS) ×2
DERMABOND ADVANCED .7 DNX12 (GAUZE/BANDAGES/DRESSINGS) IMPLANT
DRAPE LAPAROTOMY 100X72 PEDS (DRAPES) ×3 IMPLANT
DRAPE UTILITY XL STRL (DRAPES) ×3 IMPLANT
ELECT COATED BLADE 2.86 ST (ELECTRODE) ×3 IMPLANT
ELECT REM PT RETURN 9FT ADLT (ELECTROSURGICAL) ×3
ELECTRODE REM PT RTRN 9FT ADLT (ELECTROSURGICAL) ×1 IMPLANT
GLOVE BIOGEL PI IND STRL 7.0 (GLOVE) IMPLANT
GLOVE BIOGEL PI IND STRL 8 (GLOVE) ×1 IMPLANT
GLOVE BIOGEL PI INDICATOR 7.0 (GLOVE) ×4
GLOVE BIOGEL PI INDICATOR 8 (GLOVE) ×2
GLOVE ECLIPSE 6.5 STRL STRAW (GLOVE) ×2 IMPLANT
GLOVE ECLIPSE 8.0 STRL XLNG CF (GLOVE) ×3 IMPLANT
GOWN STRL REUS W/ TWL LRG LVL3 (GOWN DISPOSABLE) ×2 IMPLANT
GOWN STRL REUS W/TWL LRG LVL3 (GOWN DISPOSABLE) ×6
NDL HYPO 25X1 1.5 SAFETY (NEEDLE) ×1 IMPLANT
NEEDLE HYPO 25X1 1.5 SAFETY (NEEDLE) ×3 IMPLANT
NS IRRIG 1000ML POUR BTL (IV SOLUTION) ×2 IMPLANT
PACK BASIN DAY SURGERY FS (CUSTOM PROCEDURE TRAY) ×3 IMPLANT
PENCIL BUTTON HOLSTER BLD 10FT (ELECTRODE) ×3 IMPLANT
SLEEVE SCD COMPRESS KNEE MED (MISCELLANEOUS) ×3 IMPLANT
SPONGE LAP 4X18 RFD (DISPOSABLE) ×2 IMPLANT
STAPLER VISISTAT 35W (STAPLE) IMPLANT
STRIP CLOSURE SKIN 1/2X4 (GAUZE/BANDAGES/DRESSINGS) IMPLANT
SUT MON AB 4-0 PC3 18 (SUTURE) ×3 IMPLANT
SUT VICRYL 3-0 CR8 SH (SUTURE) ×3 IMPLANT
SUT VICRYL AB 3 0 TIES (SUTURE) IMPLANT
SYR BULB 3OZ (MISCELLANEOUS) ×2 IMPLANT
SYR CONTROL 10ML LL (SYRINGE) ×3 IMPLANT
TOWEL GREEN STERILE FF (TOWEL DISPOSABLE) ×4 IMPLANT
TOWEL OR NON WOVEN STRL DISP B (DISPOSABLE) ×1 IMPLANT
TUBE CONNECTING 20'X1/4 (TUBING)
TUBE CONNECTING 20X1/4 (TUBING) IMPLANT
YANKAUER SUCT BULB TIP NO VENT (SUCTIONS) IMPLANT

## 2018-01-03 NOTE — Telephone Encounter (Signed)
Patient left a message to inform that her excision of lymphoma is scheduled for July 17th at 11:30 am, [facility] with Dr. Brantley Stage.  Patient will have an overnight stay following surgery...Marland KitchenMarland Kitchenfyi

## 2018-01-03 NOTE — Interval H&P Note (Signed)
History and Physical Interval Note:  01/03/2018 11:13 AM  [name] [name]  has presented today for surgery, with the diagnosis of mass upper back  The various methods of treatment have been discussed with the patient and family. After consideration of risks, benefits and other options for treatment, the patient has consented to  Procedure(s): EXCISION LEFT [place] (Left) as a surgical intervention .  The patient's history has been reviewed, patient examined, no change in status, stable for surgery.  I have reviewed the patient's chart and labs.  Questions were answered to the patient's satisfaction.     [facility]

## 2018-01-03 NOTE — Anesthesia Preprocedure Evaluation (Signed)
Anesthesia Evaluation  Patient identified by MRN, date of birth, ID band Patient awake    Reviewed: Allergy & Precautions, NPO status , Patient's Chart, lab work & pertinent test results  Airway Mallampati: II  TM Distance: >3 FB Neck ROM: Full    Dental  (+) Dental Advisory Given   Pulmonary former smoker,    breath sounds clear to auscultation       Cardiovascular hypertension, Pt. on medications  Rhythm:Regular Rate:Normal     Neuro/Psych negative neurological ROS     GI/Hepatic negative GI ROS, Neg liver ROS,   Endo/Other  negative endocrine ROS  Renal/GU negative Renal ROS     Musculoskeletal   Abdominal   Peds  Hematology negative hematology ROS (+)   Anesthesia Other Findings   Reproductive/Obstetrics                             Lab Results  Component Value Date   WBC 4.3 04/03/2017   HGB 14.4 01/03/2018   HCT 39.7 04/03/2017   MCV 84 04/03/2017   PLT 304 04/03/2017   Lab Results  Component Value Date   CREATININE 0.93 01/01/2018   BUN 9 01/01/2018   NA 140 01/01/2018   K 3.8 01/01/2018   CL 106 01/01/2018   CO2 28 01/01/2018    Anesthesia Physical Anesthesia Plan  ASA: II  Anesthesia Plan: General   Post-op Pain Management:    Induction: Intravenous  PONV Risk Score and Plan: 3 and Midazolam, Dexamethasone, Ondansetron and Treatment may vary due to age or medical condition  Airway Management Planned:   Additional Equipment:   Intra-op Plan:   Post-operative Plan: Extubation in OR  Informed Consent: I have reviewed the patients History and Physical, chart, labs and discussed the procedure including the risks, benefits and alternatives for the proposed anesthesia with the patient or authorized representative who has indicated his/her understanding and acceptance.   Dental advisory given  Plan Discussed with: CRNA  Anesthesia Plan Comments:          Anesthesia Quick Evaluation

## 2018-01-03 NOTE — Op Note (Signed)
Preoperative diagnosis: Left upper back 5.5 cm x 5 cm lipoma  Postoperative diagnosis: Same  Procedure: Excision of upper back lipoma  Surgeon: Erroll Luna, MD  Anesthesia: General with local  EBL: 10 cc  Specimen: Lipoma multi lobulated to pathology  Drains: None  IV fluids: Per anesthesia record indications for procedure: The patient presents for excision of painful left upper back mass.  MRI was obtained since this felt fixed during my exam.  MRI revealed a lobulated lipoma.  There are no complicating features.  She desired excision due to pain.The procedure has been discussed with the patient.  Alternative therapies have been discussed with the patient.  Operative risks include bleeding,  Infection,  Organ injury,  Nerve injury,  Blood vessel injury,  DVT,  Pulmonary embolism,  Death,  And possible reoperation.  Medical management risks include worsening of present situation.  The success of the procedure is 50 -90 % at treating patients symptoms.  The patient understands and agrees to proceed.    Description of procedure: The patient was met in the holding area.  The mass was marked on her left upper back with the assistance of the patient.  Questions were answered.  She was taken back to the operative room.  She was placed supine upon the operating room table where general anesthesia was initiated.  She was then brought up on a beanbag on her right side to visualize the left upper back mass.  This was prepped and draped in sterile fashion.  Timeout was done.  She received preoperative antibiotics.  Local anesthetic of 0.25% Sensorcaine was infiltrated around the mass.  Transverse incision was made.  The mass was 5.5 cm in maximal diameter.  It was multilobulated all lobulations were removed in its entirety with a grossly negative margin.  Hemostasis achieved.  The wound was irrigated until clear.  There are no signs of bleeding.  This was all superficial to the trapezius muscle and did  not involve the trapezius muscle.  Wound was closed with 3-0 Vicryl and 4-0 Monocryl.  Dermabond applied.  All final counts found to be correct sponge, needle and instruments.  The patient was awoke extubated taken recovery in satisfactory condition.

## 2018-01-03 NOTE — H&P (Signed)
[name] [name]  Location: [facility] Patient #: [id number] DOB: December 03, 1955 Widowed / Language: [place] / Race: Black or African American Female  History of Present Illness  Patient words: Patient sent request of Dr. [name] for a mass over her left upper back. It is present for many years. He is begun to grow and get larger. It is causing discomfort especially she lays on her puts her head backwards. It is a shooting pain from this area sharp in nature made worse with pressure or movement. There is no redness or drainage associated with it.  The patient is a 62 year old female.   Problem List/Past Medical  LIPOMA OF BACK (D17.1)  Allergies  Oridol *COUGH/COLD/ALLERGY*  Medication History  Diclofenac Sodium (1% Gel, Transdermal) Active. HydroCHLOROthiazide (25MG  Tablet, Oral) Active. Morphine Sulfate (15MG  Tablet, Oral) Active. Prempro (0.3-1.5MG  Tablet, Oral) Active. Fluticasone Propionate (50MCG/ACT Suspension, Nasal) Active. Narcan (4MG /0.1ML Liquid, Nasal) Active. Medications Reconciled     Review of Systems  All other systems negative  Vitals  12/01/2017 9:11 AM Weight: 213.25 lb Height: 64in Body Surface Area: 2.01 m Body Mass Index: 36.6 kg/m  Temp.: 98.47F(Oral)  Pulse: 80 (Regular)  BP: 132/82 (Sitting, Left Arm, Standard)      Physical Exam   General Mental Status-Alert. General Appearance-Consistent with stated age. Hydration-Well hydrated. Voice-Normal.  Integumentary Note: Over the left trapezius muscle was a 5 cm mass. It is smooth and well demarcated. It appears deep muscle. It is tender to touch. There is no redness or fluctuance.  Head and Neck Head-normocephalic, atraumatic with no lesions or palpable masses. Trachea-midline. Thyroid Gland Characteristics - normal size and consistency.  Chest and Lung Exam Chest and lung exam reveals -quiet, even and  easy respiratory effort with no use of accessory muscles and on auscultation, normal breath sounds, no adventitious sounds and normal vocal resonance. Inspection Chest Wall - Normal. Back - normal.  Cardiovascular Cardiovascular examination reveals -normal heart sounds, regular rate and rhythm with no murmurs and normal pedal pulses bilaterally.  Neurologic Neurologic evaluation reveals -alert and oriented x 3 with no impairment of recent or remote memory. Mental Status-Normal.  Musculoskeletal Normal Exam - Left-Upper Extremity Strength Normal and Lower Extremity Strength Normal. Normal Exam - Right-Upper Extremity Strength Normal and Lower Extremity Strength Normal.    Assessment & Plan   LIPOMA OF BACK (D17.1) Impression: 5 cm over left trapezius fixed check preoperative MRI SET UP FOR EXCISION DUE TO PAIN Patient on chronic narcotics followed closely for that. There is no evidence of abuse. Marland Kitchen Postoperative pain management information given to the patient.  Current Plans The pathophysiology of skin & subcutaneous masses was discussed. Natural history risks without surgery were discussed. I recommended surgery to remove the mass. I explained the technique of removal with use of local anesthesia & possible need for more aggressive sedation/anesthesia for patient comfort.  Risks such as bleeding, infection, wound breakdown, heart attack, death, and other risks were discussed. I noted a good likelihood this will help address the problem. Possibility that this will not correct all symptoms was explained. Possibility of regrowth/recurrence of the mass was discussed. We will work to minimize complications. Questions were answered. The patient expresses understanding & wishes to proceed with surgery.  Pt Education - [facility] Free Text Education/Instructions: discussed with patient and provided information. Pt Education - [facility] General Post-op HCI

## 2018-01-03 NOTE — Anesthesia Procedure Notes (Signed)
Procedure Name: Intubation Date/Time: 01/03/2018 11:50 AM Performed by: Marrianne Mood, CRNA Pre-anesthesia Checklist: Patient identified, Emergency Drugs available, Suction available, Patient being monitored and Timeout performed Patient Re-evaluated:Patient Re-evaluated prior to induction Oxygen Delivery Method: Circle system utilized Preoxygenation: Pre-oxygenation with 100% oxygen Induction Type: IV induction Ventilation: Mask ventilation without difficulty Laryngoscope Size: Glidescope and 3 Grade View: Grade II Tube type: Oral Tube size: 7.0 mm Number of attempts: 1 Airway Equipment and Method: Stylet and Oral airway Placement Confirmation: ETT inserted through vocal cords under direct vision,  positive ETCO2 and breath sounds checked- equal and bilateral Secured at: 20 cm Tube secured with: Tape Dental Injury: Teeth and Oropharynx as per pre-operative assessment  Difficulty Due To: Difficulty was anticipated and Difficult Airway- due to reduced neck mobility

## 2018-01-03 NOTE — Transfer of Care (Signed)
Immediate Anesthesia Transfer of Care Note  Patient: [name] [name]  Procedure(s) Performed: EXCISION LEFT UPPER BACK LIPOMA ERAS PATH (Left Back)  Patient Location: PACU  Anesthesia Type:General  Level of Consciousness: awake and patient cooperative  Airway & Oxygen Therapy: Patient Spontanous Breathing and Patient connected to face mask oxygen  Post-op Assessment: Report given to RN and Post -op Vital signs reviewed and stable  Post vital signs: Reviewed and stable  Last Vitals:  Vitals Value Taken Time  BP 165/92 01/03/2018 12:45 PM  Temp    Pulse 95 01/03/2018 12:46 PM  Resp 16 01/03/2018 12:46 PM  SpO2 100 % 01/03/2018 12:46 PM  Vitals shown include unvalidated device data.  Last Pain:  Vitals:   01/03/18 0947  TempSrc: Oral  PainSc: 7          Complications: No apparent anesthesia complications

## 2018-01-04 ENCOUNTER — Encounter (HOSPITAL_BASED_OUTPATIENT_CLINIC_OR_DEPARTMENT_OTHER): Payer: Self-pay | Admitting: Surgery

## 2018-01-04 DIAGNOSIS — Z888 Allergy status to other drugs, medicaments and biological substances status: Secondary | ICD-10-CM | POA: Diagnosis not present

## 2018-01-04 DIAGNOSIS — Z79899 Other long term (current) drug therapy: Secondary | ICD-10-CM | POA: Diagnosis not present

## 2018-01-04 DIAGNOSIS — Z791 Long term (current) use of non-steroidal anti-inflammatories (NSAID): Secondary | ICD-10-CM | POA: Diagnosis not present

## 2018-01-04 DIAGNOSIS — Z79891 Long term (current) use of opiate analgesic: Secondary | ICD-10-CM | POA: Diagnosis not present

## 2018-01-04 DIAGNOSIS — Z79890 Hormone replacement therapy (postmenopausal): Secondary | ICD-10-CM | POA: Diagnosis not present

## 2018-01-04 DIAGNOSIS — I10 Essential (primary) hypertension: Secondary | ICD-10-CM | POA: Diagnosis not present

## 2018-01-04 DIAGNOSIS — D171 Benign lipomatous neoplasm of skin and subcutaneous tissue of trunk: Secondary | ICD-10-CM | POA: Diagnosis not present

## 2018-01-04 DIAGNOSIS — Z87891 Personal history of nicotine dependence: Secondary | ICD-10-CM | POA: Diagnosis not present

## 2018-01-04 NOTE — Discharge Instructions (Signed)
GENERAL SURGERY: POST OP INSTRUCTIONS ° °###################################################################### ° °EAT °Gradually transition to a high fiber diet with a fiber supplement over the next few weeks after discharge.  Start with a pureed / full liquid diet (see below) ° °WALK °Walk an hour a day.  Control your pain to do that.   ° °CONTROL PAIN °Control pain so that you can walk, sleep, tolerate sneezing/coughing, go up/down stairs. ° °HAVE A BOWEL MOVEMENT DAILY °Keep your bowels regular to avoid problems.  OK to try a laxative to override constipation.  OK to use an antidairrheal to slow down diarrhea.  Call if not better after 2 tries ° °CALL IF YOU HAVE PROBLEMS/CONCERNS °Call if you are still struggling despite following these instructions. °Call if you have concerns not answered by these instructions ° °###################################################################### ° ° ° °1. DIET: Follow a light bland diet the first 24 hours after arrival home, such as soup, liquids, crackers, etc.  Be sure to include lots of fluids daily.  Avoid fast food or heavy meals as your are more likely to get nauseated.   °2. Take your usually prescribed home medications unless otherwise directed. °3. PAIN CONTROL: °a. Pain is best controlled by a usual combination of three different methods TOGETHER: °i. Ice/Heat °ii. Over the counter pain medication °iii. Prescription pain medication °b. Most patients will experience some swelling and bruising around the incisions.  Ice packs or heating pads (30-60 minutes up to 6 times a day) will help. Use ice for the first few days to help decrease swelling and bruising, then switch to heat to help relax tight/sore spots and speed recovery.  Some people prefer to use ice alone, heat alone, alternating between ice & heat.  Experiment to what works for you.  Swelling and bruising can take several weeks to resolve.   °c. It is helpful to take an over-the-counter pain medication  regularly for the first few weeks.  Choose one of the following that works best for you: °i. Naproxen (Aleve, etc)  Two 220mg tabs twice a day °ii. Ibuprofen (Advil, etc) Three 200mg tabs four times a day (every meal & bedtime) °iii. Acetaminophen (Tylenol, etc) 500-650mg four times a day (every meal & bedtime) °d. A  prescription for pain medication (such as oxycodone, hydrocodone, etc) should be given to you upon discharge.  Take your pain medication as prescribed.  °i. If you are having problems/concerns with the prescription medicine (does not control pain, nausea, vomiting, rash, itching, etc), please call us [phone] to see if we need to switch you to a different pain medicine that will work better for you and/or control your side effect better. °ii. If you need a refill on your pain medication, please contact your pharmacy.  They will contact our office to request authorization. Prescriptions will not be filled after 5 pm or on week-ends. °4. Avoid getting constipated.  Between the surgery and the pain medications, it is common to experience some constipation.  Increasing fluid intake and taking a fiber supplement (such as Metamucil, Citrucel, FiberCon, MiraLax, etc) 1-2 times a day regularly will usually help prevent this problem from occurring.  A mild laxative (prune juice, Milk of Magnesia, MiraLax, etc) should be taken according to package directions if there are no bowel movements after 48 hours.   °5. Wash / shower every day.  You may shower over the dressings as they are waterproof.  Continue to shower over incision(s) after the dressing is off. °6. Remove your waterproof bandages   5 days after surgery.  You may leave the incision open to air.  You may have skin tapes (Steri Strips) covering the incision(s).  Leave them on until one week, then remove.  You may replace a dressing/Band-Aid to cover the incision for comfort if you wish.  ° ° ° ° °7. ACTIVITIES as tolerated:   °a. You may resume  regular (light) daily activities beginning the next day--such as daily self-care, walking, climbing stairs--gradually increasing activities as tolerated.  If you can walk 30 minutes without difficulty, it is safe to try more intense activity such as jogging, treadmill, bicycling, low-impact aerobics, swimming, etc. °b. Save the most intensive and strenuous activity for last such as sit-ups, heavy lifting, contact sports, etc  Refrain from any heavy lifting or straining until you are off narcotics for pain control.   °c. DO NOT PUSH THROUGH PAIN.  Let pain be your guide: If it hurts to do something, don't do it.  Pain is your body warning you to avoid that activity for another week until the pain goes down. °d. You may drive when you are no longer taking prescription pain medication, you can comfortably wear a seatbelt, and you can safely maneuver your car and apply brakes. °e. You may have sexual intercourse when it is comfortable.  °8. FOLLOW UP in our office °a. Please call [facility] at [phone] to set up an appointment to see your surgeon in the office for a follow-up appointment approximately 2-3 weeks after your surgery. °b. Make sure that you call for this appointment the day you arrive home to insure a convenient appointment time. °9. IF YOU HAVE DISABILITY OR FAMILY LEAVE FORMS, BRING THEM TO THE OFFICE FOR PROCESSING.  DO NOT GIVE THEM TO YOUR DOCTOR. ° ° °WHEN TO CALL US [phone]: °1. Poor pain control °2. Reactions / problems with new medications (rash/itching, nausea, etc)  °3. Fever over 101.5 F (38.5 C) °4. Worsening swelling or bruising °5. Continued bleeding from incision. °6. Increased pain, redness, or drainage from the incision °7. Difficulty breathing / swallowing ° ° The clinic staff is available to answer your questions during regular business hours (8:30am-5pm).  Please don’t hesitate to call and ask to speak to one of our nurses for clinical concerns.  ° If you have a medical emergency,  go to the nearest emergency room or call 911. ° A surgeon from [facility] is always on call at the hospitals ° ° °[facility] °[address], [address], [place], [place]  [address] ? °MAIN: [phone] ? TOLL FREE: [phone] ?  °FAX [phone] °[email or web address] ° ° °Post Anesthesia Home Care Instructions ° °Activity: °Get plenty of rest for the remainder of the day. A responsible individual must stay with you for 24 hours following the procedure.  °For the next 24 hours, DO NOT: °-Drive a car °-Operate machinery °-Drink alcoholic beverages °-Take any medication unless instructed by your physician °-Make any legal decisions or sign important papers. ° °Meals: °Start with liquid foods such as gelatin or soup. Progress to regular foods as tolerated. Avoid greasy, spicy, heavy foods. If nausea and/or vomiting occur, drink only clear liquids until the nausea and/or vomiting subsides. Call your physician if vomiting continues. ° °Special Instructions/Symptoms: °Your throat may feel dry or sore from the anesthesia or the breathing tube placed in your throat during surgery. If this causes discomfort, gargle with warm salt water. The discomfort should disappear within 24 hours. ° °  If you had a scopolamine patch placed behind your ear for the management of post- operative nausea and/or vomiting: ° °1. The medication in the patch is effective for 72 hours, after which it should be removed.  Wrap patch in a tissue and discard in the trash. Wash hands thoroughly with soap and water. °2. You may remove the patch earlier than 72 hours if you experience unpleasant side effects which may include dry mouth, dizziness or visual disturbances. °3. Avoid touching the patch. Wash your hands with soap and water after contact with the patch. °  ° °

## 2018-01-05 NOTE — Anesthesia Postprocedure Evaluation (Signed)
Anesthesia Post Note  Patient: [name] [name]  Procedure(s) Performed: EXCISION LEFT UPPER BACK LIPOMA ERAS PATH (Left Back)     Patient location during evaluation: PACU Anesthesia Type: General Level of consciousness: awake and alert Pain management: pain level controlled Vital Signs Assessment: post-procedure vital signs reviewed and stable Respiratory status: spontaneous breathing, nonlabored ventilation and respiratory function stable Cardiovascular status: blood pressure returned to baseline and stable Postop Assessment: no apparent nausea or vomiting Anesthetic complications: no    Last Vitals:  Vitals:   01/04/18 0544 01/04/18 0754  BP: 105/60 118/69  Pulse: (!) 55 60  Resp: 16   Temp: 36.5 C 36.5 C  SpO2: 99% 100%    Last Pain:  Vitals:   01/04/18 0650  TempSrc:   PainSc: [place]

## 2018-01-09 ENCOUNTER — Encounter: Payer: Medicare Other | Attending: Physical Medicine & Rehabilitation | Admitting: Registered Nurse

## 2018-01-09 ENCOUNTER — Encounter: Payer: Self-pay | Admitting: Registered Nurse

## 2018-01-09 VITALS — BP 158/90 | HR 67 | Ht 64.0 in | Wt 214.0 lb

## 2018-01-09 DIAGNOSIS — Z79891 Long term (current) use of opiate analgesic: Secondary | ICD-10-CM | POA: Diagnosis not present

## 2018-01-09 DIAGNOSIS — M62838 Other muscle spasm: Secondary | ICD-10-CM

## 2018-01-09 DIAGNOSIS — Z5181 Encounter for therapeutic drug level monitoring: Secondary | ICD-10-CM

## 2018-01-09 DIAGNOSIS — M549 Dorsalgia, unspecified: Secondary | ICD-10-CM | POA: Diagnosis not present

## 2018-01-09 DIAGNOSIS — M5417 Radiculopathy, lumbosacral region: Secondary | ICD-10-CM | POA: Diagnosis not present

## 2018-01-09 DIAGNOSIS — M5414 Radiculopathy, thoracic region: Secondary | ICD-10-CM | POA: Diagnosis not present

## 2018-01-09 DIAGNOSIS — M961 Postlaminectomy syndrome, not elsewhere classified: Secondary | ICD-10-CM | POA: Diagnosis not present

## 2018-01-09 DIAGNOSIS — G894 Chronic pain syndrome: Secondary | ICD-10-CM

## 2018-01-09 MED ORDER — MORPHINE SULFATE 15 MG PO TABS: 15.0000 mg | ORAL_TABLET | Freq: Two times a day (BID) | ORAL | 0 refills | Status: DC | PRN

## 2018-01-09 MED ORDER — MORPHINE SULFATE ER 15 MG PO T12A: 15.0000 mg | EXTENDED_RELEASE_TABLET | Freq: Three times a day (TID) | ORAL | 0 refills | Status: DC

## 2018-01-09 NOTE — Progress Notes (Signed)
Subjective:    Patient ID: [name] [name], female    DOB: August 14, 1955, 62 y.o.   MRN: [id number]  HPI: [name] [name] is 62 year old female who returns for follow up appointment for chronic pain and medication refill. She states her pain is in her upper back s/p lipoma excision of left upper back bandage intact and lower back pain. She rates her pain 9. Her current exercise regime is walking.   [name] [name] Morphine Equivalent is 75.00 MME. Last Oral Swab was Performed on 12/14/2017, it was consistent.   Pain Inventory Average Pain 8 Pain Right Now 9 My pain is constant, sharp, burning and stabbing  In the last 24 hours, has pain interfered with the following? General activity 8 Relation with others 9 Enjoyment of life 8 What TIME of day is your pain at its worst? all Sleep (in general) Poor  Pain is worse with: walking, bending, inactivity and standing Pain improves with: rest, heat/ice and medication Relief from Meds: 1  Mobility ability to climb steps?  yes do you drive?  yes  Function disabled: date disabled 10/2007 I need assistance with the following:  household duties  Neuro/Psych spasms  Prior Studies Any changes since last visit?  yes CT/MRI  Physicians involved in your care Any changes since last visit?  yes   Family History  Problem Relation Age of Onset  . Heart disease Mother 88       CHF; CAD  . Hypertension Mother   . Heart failure Mother   . Diabetes Sister   . Lupus Sister   . Kidney failure Sister   . Hypertension Brother   . Hyperlipidemia Sister   . Hypertension Sister   . Colon cancer Neg Hx   . Rectal cancer Neg Hx    Social History   Socioeconomic History  . Marital status: Widowed    Spouse name: Not on file  . Number of children: 3  . Years of education: Not on file  . Highest education level: Not on file  Occupational History  . 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 Financial  resource strain: Not on file  . Food insecurity:    Worry: Not on file    Inability: Not on file  . Transportation needs:    Medical: Not on file    Non-medical: Not on file  Tobacco Use  . Smoking status: Former Smoker    Packs/day: 0.50    Years: 20.00    Pack years: 10.00    Types: Cigarettes    Last attempt to quit: 07/21/2008    Years since quitting: 9.4  . Smokeless tobacco: Never Used  Substance and Sexual Activity  . Alcohol use: No  . Drug use: No  . Sexual activity: Not on file  Lifestyle  . Physical activity:    Days per week: Not on file    Minutes per session: Not on file  . Stress: Not on file  Relationships  . Social connections:    Talks on phone: Not on file    Gets together: Not on file    Attends religious service: Not on file    Active member of club or organization: Not on file    Attends meetings of clubs or organizations: Not on file    Relationship status: Not on file  Other Topics Concern  . Not on file  Social History Narrative   Marital status:  widowed since 2002; not dating which is bad in 2018      Children: 3 children (45, 72, 34); 13 grandchildren; 1 gg      Employment: disability since 2009; retail      Lives: alone in [place] in apartment      Tobacco: quit in 2011      Alcohol: none      Drugs: none      Exercise: minimal in 2018      ADLs: independent with ADLs; drives      Seatbelt: 100%; no texting.   Past Surgical History:  Procedure Laterality Date  . ABDOMINAL HYSTERECTOMY     DUB; ovaries intact  . BREAST BIOPSY    . CARDIAC CATHETERIZATION  1990's   [name]  . COLON SURGERY  05/11/11   [facility] of adhesions Crohn's  . MASS EXCISION Left 01/03/2018   Procedure: EXCISION LEFT UPPER BACK LIPOMA ERAS PATH;  Surgeon: Erroll Luna, MD;  Location: [facility];  Service: General;  Laterality: Left;  . REDUCTION MAMMAPLASTY    . SPINE SURGERY     Past Medical History:  Diagnosis Date  . Blood  transfusion without reported diagnosis   . Crohn's disease ([place])   . Disorder of sacroiliac joint   . Hyperlipidemia   . Hypertension   . Ischemic colitis ([place])   . Lumbago   . Lumbar post-laminectomy syndrome   . Lumbosacral neuritis   . Lumbosacral radiculitis   . Sciatica    BP (!) 158/90 (BP Location: Left Arm, Patient Position: Sitting, Cuff Size: Normal)   Pulse 67   Ht 5\' 4"  (1.626 m)   Wt 214 lb (97.1 kg)   SpO2 98%   BMI 36.73 kg/m   Opioid Risk Score:   Fall Risk Score:  `1  Depression screen PHQ 2/9  Depression screen [facility] 2/9 10/18/2017 09/18/2017 07/19/2017 04/03/2017 02/23/2017 01/02/2017 10/25/2016  Decreased Interest 0 0 0 0 0 0 0  Down, Depressed, Hopeless 0 0 3 0 0 0 0  PHQ - 2 Score 0 0 3 0 0 0 0  Altered sleeping - 0 - - - - -  Tired, decreased energy - 0 - - - - -  Change in appetite - 0 - - - - -  Feeling bad or failure about yourself  - 0 - - - - -  Trouble concentrating - 0 - - - - -  Moving slowly or fidgety/restless - 0 - - - - -  Suicidal thoughts - 0 - - - - -  PHQ-9 Score - 0 - - - - -  Difficult doing work/chores - - - - - - -    Review of Systems  Constitutional: Negative.   HENT: Negative.   Eyes: Negative.   Respiratory: Negative.   Gastrointestinal: Positive for nausea.  Endocrine: Negative.   Genitourinary: Negative.   Musculoskeletal: Negative.   Skin: Negative.   Allergic/Immunologic: Negative.   Neurological: Negative.   Hematological: Negative.   Psychiatric/Behavioral: Negative.   All other systems reviewed and are negative.      Objective:   Physical Exam  Constitutional: She is oriented to person, place, and time. She appears well-developed and well-nourished.  HENT:  Head: Normocephalic and atraumatic.  Neck: Normal range of motion. Neck supple.  Cardiovascular: Normal rate and regular rhythm.  Pulmonary/Chest: Effort normal and breath sounds normal.  Musculoskeletal:  Normal Muscle Bulk and Muscle Testing Reveals:    Upper Extremities:  Full ROM and Muscle Strength 5/5 Thoracic Paraspinal Tenderness: T-2-T-3 Mainly Left side S/P lipoma excision Lumbar Paraspinal Tenderness: L-4-L-5 Lower Extremities: Full ROM and Muscle Strength 5/5 Arises from Table slowly Narrow Based Gait  Neurological: She is alert and oriented to person, place, and time.  Skin: Skin is warm and dry.  Psychiatric: She has a normal mood and affect. Her behavior is normal.  Nursing note and vitals reviewed.         Assessment & Plan:  1.Lumbar Postlaminectomy/ Spinal Stenosis Lumbar Region/ Post-Op Pain/Low back pain with radiating symptoms in L3-4 distribution: continues to be limited by pain. Refilled: Continue with current Treatment Regimen :MSIR 15 mg one tablet twice a day as needed for moderate to sever pain #60 andMorphabond 15 mg Q 8 hours.01/09/2018. . We will continue the opioid monitoring program, this consists of regular clinic visits, examinations, urine drug screen, pill counts as well as use of [place] Controlled Substance Reporting System. Encouraged to Continue exercise regime.  2. Lumbar Radiculopathy: No complaints today.Continue to Monitor 01/09/2018 3. Chronic Right Knee Pain:No complaints today. Continue with Ice TherapyandVoltaren Gel. 01/09/2018 4. Muscle Spasm: Continuecurrent treatment: Tizanidine. 07//23/2019 5. Lipoma: S/P Left  Lipoma Excision of upper back  By   Dr. Brantley Stage on 01/03/2018. Surgery Following. Continue to Monitor. 01/09/2018.   20 minutes of face to face patient care time was spent during this visit. All questions were encouraged and answered.  F/U in 1 month

## 2018-01-20 ENCOUNTER — Other Ambulatory Visit: Payer: Self-pay | Admitting: Family Medicine

## 2018-01-20 DIAGNOSIS — Z79890 Hormone replacement therapy (postmenopausal): Secondary | ICD-10-CM

## 2018-02-09 ENCOUNTER — Encounter: Payer: Self-pay | Admitting: Registered Nurse

## 2018-02-09 ENCOUNTER — Encounter: Payer: Medicare Other | Attending: Physical Medicine & Rehabilitation | Admitting: Registered Nurse

## 2018-02-09 VITALS — BP 129/82 | HR 70 | Ht 64.0 in | Wt 217.8 lb

## 2018-02-09 DIAGNOSIS — Z79891 Long term (current) use of opiate analgesic: Secondary | ICD-10-CM | POA: Diagnosis not present

## 2018-02-09 DIAGNOSIS — G894 Chronic pain syndrome: Secondary | ICD-10-CM

## 2018-02-09 DIAGNOSIS — Z5181 Encounter for therapeutic drug level monitoring: Secondary | ICD-10-CM

## 2018-02-09 DIAGNOSIS — M961 Postlaminectomy syndrome, not elsewhere classified: Secondary | ICD-10-CM

## 2018-02-09 DIAGNOSIS — M5417 Radiculopathy, lumbosacral region: Secondary | ICD-10-CM | POA: Insufficient documentation

## 2018-02-09 DIAGNOSIS — M48061 Spinal stenosis, lumbar region without neurogenic claudication: Secondary | ICD-10-CM

## 2018-02-09 DIAGNOSIS — M62838 Other muscle spasm: Secondary | ICD-10-CM

## 2018-02-09 DIAGNOSIS — M5414 Radiculopathy, thoracic region: Secondary | ICD-10-CM | POA: Insufficient documentation

## 2018-02-09 MED ORDER — TIZANIDINE HCL 2 MG PO TABS: 2.0000 mg | ORAL_TABLET | Freq: Two times a day (BID) | ORAL | 1 refills | Status: DC | PRN

## 2018-02-09 MED ORDER — MORPHINE SULFATE ER 15 MG PO T12A: 15.0000 mg | EXTENDED_RELEASE_TABLET | Freq: Three times a day (TID) | ORAL | 0 refills | Status: DC

## 2018-02-09 MED ORDER — DICLOFENAC SODIUM 1 % TD GEL: 2.0000 g | Freq: Four times a day (QID) | TRANSDERMAL | 2 refills | Status: DC

## 2018-02-09 MED ORDER — MORPHINE SULFATE 15 MG PO TABS: 15.0000 mg | ORAL_TABLET | Freq: Two times a day (BID) | ORAL | 0 refills | Status: DC | PRN

## 2018-02-09 NOTE — Progress Notes (Signed)
Subjective:    Patient ID: [name] [name], female    DOB: [birth year], 62 y.o.   MRN: [id number]  HPI:[name] is a 62 year old female who returns for follow up appointment for chronic pain and medication and refill. She states her pain is located in her upper and lower back. She rates her pain 8. Her current exercise regime is walking.   [name] [name] Morphine Equivalent is 75.00 MME. Last Oral Swab was Performed on 12/14/2017, it was consistent.   Pain Inventory Average Pain 8 Pain Right Now 8 My pain is constant, burning, stabbing and aching  In the last 24 hours, has pain interfered with the following? General activity 7 Relation with others 7 Enjoyment of life 7 What TIME of day is your pain at its worst? daytime Sleep (in general) Fair  Pain is worse with: walking, bending, sitting, inactivity and standing Pain improves with: rest, heat/ice and medication Relief from Meds: 2  Mobility ability to climb steps?  yes do you drive?  yes  Function disabled: date disabled 2009  Neuro/Psych spasms  Prior Studies Any changes since last visit?  no  Physicians involved in your care Any changes since last visit?  no   Family History  Problem Relation Age of Onset  . Heart disease Mother 9       CHF; CAD  . Hypertension Mother   . Heart failure Mother   . Diabetes Sister   . Lupus Sister   . Kidney failure Sister   . Hypertension Brother   . Hyperlipidemia Sister   . Hypertension Sister   . Colon cancer Neg Hx   . Rectal cancer Neg Hx    Social History   Socioeconomic History  . Marital status: Widowed    Spouse name: Not on file  . Number of children: 3  . Years of education: Not on file  . Highest education level: Not on file  Occupational History  . 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 Financial resource strain: Not on file  . Food insecurity:    Worry: Not on file    Inability: Not on file  .  Transportation needs:    Medical: Not on file    Non-medical: Not on file  Tobacco Use  . Smoking status: Former Smoker    Packs/day: 0.50    Years: 20.00    Pack years: 10.00    Types: Cigarettes    Last attempt to quit: 07/21/2008    Years since quitting: 9.5  . Smokeless tobacco: Never Used  Substance and Sexual Activity  . Alcohol use: No  . Drug use: No  . Sexual activity: Not on file  Lifestyle  . Physical activity:    Days per week: Not on file    Minutes per session: Not on file  . Stress: Not on file  Relationships  . Social connections:    Talks on phone: Not on file    Gets together: Not on file    Attends religious service: Not on file    Active member of club or organization: Not on file    Attends meetings of clubs or organizations: Not on file    Relationship status: Not on file  Other Topics Concern  . Not on file  Social History Narrative   Marital status: widowed since 2002; not dating which is bad in 2018      Children: 3 children (  68, 29, 40); 13 grandchildren; 1 gg      Employment: disability since 2009; retail      Lives: alone in [place] in apartment      Tobacco: quit in 2011      Alcohol: none      Drugs: none      Exercise: minimal in 2018      ADLs: independent with ADLs; drives      Seatbelt: 100%; no texting.   Past Surgical History:  Procedure Laterality Date  . ABDOMINAL HYSTERECTOMY     DUB; ovaries intact  . BREAST BIOPSY    . CARDIAC CATHETERIZATION  1990's   [name] [name]  . COLON SURGERY  05/11/11   [name] [name] of adhesions Crohn's  . MASS EXCISION Left 01/03/2018   Procedure: EXCISION LEFT UPPER BACK LIPOMA ERAS PATH;  Surgeon: Erroll Luna, MD;  Location: [place];  Service: General;  Laterality: Left;  . REDUCTION MAMMAPLASTY    . SPINE SURGERY     Past Medical History:  Diagnosis Date  . Blood transfusion without reported diagnosis   . Crohn's disease ([place])   . Disorder of sacroiliac joint   .  Hyperlipidemia   . Hypertension   . Ischemic colitis ([place])   . Lumbago   . Lumbar post-laminectomy syndrome   . Lumbosacral neuritis   . Lumbosacral radiculitis   . Sciatica    There were no vitals taken for this visit.  Opioid Risk Score:   Fall Risk Score:  `1  Depression screen PHQ 2/9  Depression screen [facility] 2/9 10/18/2017 09/18/2017 07/19/2017 04/03/2017 02/23/2017 01/02/2017 10/25/2016  Decreased Interest 0 0 0 0 0 0 0  Down, Depressed, Hopeless 0 0 3 0 0 0 0  PHQ - 2 Score 0 0 3 0 0 0 0  Altered sleeping - 0 - - - - -  Tired, decreased energy - 0 - - - - -  Change in appetite - 0 - - - - -  Feeling bad or failure about yourself  - 0 - - - - -  Trouble concentrating - 0 - - - - -  Moving slowly or fidgety/restless - 0 - - - - -  Suicidal thoughts - 0 - - - - -  PHQ-9 Score - 0 - - - - -  Difficult doing work/chores - - - - - - -     Review of Systems  Constitutional: Positive for diaphoresis and unexpected weight change.  HENT: Negative.   Eyes: Negative.   Respiratory: Negative.   Cardiovascular: Negative.   Gastrointestinal: Positive for nausea.  Endocrine: Negative.   Genitourinary: Negative.   Musculoskeletal: Positive for arthralgias, back pain, gait problem and myalgias.  Skin: Negative.   Allergic/Immunologic: Negative.   Hematological: Negative.   Psychiatric/Behavioral: Negative.   All other systems reviewed and are negative.      Objective:   Physical Exam  Constitutional: She is oriented to person, place, and time. She appears well-developed and well-nourished.  HENT:  Head: Normocephalic and atraumatic.  Neck: Normal range of motion. Neck supple.  Cardiovascular: Normal rate and regular rhythm.  Pulmonary/Chest: Effort normal and breath sounds normal.  Musculoskeletal:  Normal Muscle Bulk and Muscle Testing Reveals: Upper Extremities: Full ROM and Muscle Strength 5/5 Thoracic Hypersensitivity T-1-T-2 Lumbar Paraspinal Tenderness: L-4-L-5 Lower  Extremities: Full ROM and Muscle Strength 5/5 Arises from Table with ease Narrow Based Gait  Neurological: She is alert and oriented to person, place, and  time.  Skin: Skin is warm and dry.  Psychiatric: She has a normal mood and affect.  Nursing note and vitals reviewed.         Assessment & Plan:  1.Lumbar Postlaminectomy/ Spinal Stenosis Lumbar Region/ Post-Op Pain/Low back pain with radiating symptoms in L3-4 distribution: continues to be limited by pain. Refilled: Continue with current Treatment Regimen :MSIR 15 mg one tablet twice a day as needed for moderate to sever pain #60 andMorphabond 15 mg Q 8 hours.02/09/2018. . We will continue the opioid monitoring program, this consists of regular clinic visits, examinations, urine drug screen, pill counts as well as use of [place] Controlled Substance Reporting System. Encouraged to Continue exercise regime.  2. Lumbar Radiculopathy: No complaints today.Continue to Monitor 02/09/2018 3. Chronic Right Knee Pain:No complaints today. Continue with Ice TherapyandVoltaren Gel. 02/09/2018 4. Muscle Spasm: Continuecurrent treatment: Tizanidine. 08//23/2019 5. Lipoma: S/P Left  Lipoma Excision of upper back  By   Dr. Brantley Stage on 01/03/2018. Surgery Following. Continue to Monitor. 02/09/2018.   20 minutes of face to face patient care time was spent during this visit. All questions were encouraged and answered.  F/U in 1 month

## 2018-02-17 ENCOUNTER — Other Ambulatory Visit: Payer: Self-pay | Admitting: Urgent Care

## 2018-02-17 DIAGNOSIS — I10 Essential (primary) hypertension: Secondary | ICD-10-CM

## 2018-02-21 ENCOUNTER — Other Ambulatory Visit: Payer: Self-pay | Admitting: Urgent Care

## 2018-02-21 DIAGNOSIS — I10 Essential (primary) hypertension: Secondary | ICD-10-CM

## 2018-02-23 ENCOUNTER — Ambulatory Visit: Payer: Medicare Other | Admitting: Physician Assistant

## 2018-03-07 ENCOUNTER — Encounter: Payer: Self-pay | Admitting: Registered Nurse

## 2018-03-07 ENCOUNTER — Ambulatory Visit (INDEPENDENT_AMBULATORY_CARE_PROVIDER_SITE_OTHER): Payer: Medicare Other | Admitting: Physician Assistant

## 2018-03-07 ENCOUNTER — Encounter: Payer: Medicare Other | Attending: Physical Medicine & Rehabilitation | Admitting: Registered Nurse

## 2018-03-07 VITALS — BP 148/88 | HR 60 | Temp 98.1°F | Resp 16 | Ht 64.0 in | Wt 219.0 lb

## 2018-03-07 VITALS — BP 168/84 | HR 60 | Resp 14 | Ht 64.0 in | Wt 220.0 lb

## 2018-03-07 DIAGNOSIS — R0981 Nasal congestion: Secondary | ICD-10-CM | POA: Diagnosis not present

## 2018-03-07 DIAGNOSIS — M5417 Radiculopathy, lumbosacral region: Secondary | ICD-10-CM | POA: Insufficient documentation

## 2018-03-07 DIAGNOSIS — Z5181 Encounter for therapeutic drug level monitoring: Secondary | ICD-10-CM | POA: Diagnosis not present

## 2018-03-07 DIAGNOSIS — Z79891 Long term (current) use of opiate analgesic: Secondary | ICD-10-CM

## 2018-03-07 DIAGNOSIS — M62838 Other muscle spasm: Secondary | ICD-10-CM

## 2018-03-07 DIAGNOSIS — M961 Postlaminectomy syndrome, not elsewhere classified: Secondary | ICD-10-CM | POA: Insufficient documentation

## 2018-03-07 DIAGNOSIS — Z23 Encounter for immunization: Secondary | ICD-10-CM | POA: Diagnosis not present

## 2018-03-07 DIAGNOSIS — M5414 Radiculopathy, thoracic region: Secondary | ICD-10-CM | POA: Diagnosis not present

## 2018-03-07 DIAGNOSIS — G894 Chronic pain syndrome: Secondary | ICD-10-CM

## 2018-03-07 DIAGNOSIS — M48061 Spinal stenosis, lumbar region without neurogenic claudication: Secondary | ICD-10-CM

## 2018-03-07 DIAGNOSIS — I10 Essential (primary) hypertension: Secondary | ICD-10-CM

## 2018-03-07 MED ORDER — HYDROCHLOROTHIAZIDE 25 MG PO TABS: 25.0000 mg | ORAL_TABLET | Freq: Every day | ORAL | 3 refills | Status: DC

## 2018-03-07 MED ORDER — FLUTICASONE PROPIONATE 50 MCG/ACT NA SUSP: 2.0000 | Freq: Every day | NASAL | 0 refills | Status: DC

## 2018-03-07 MED ORDER — MORPHINE SULFATE 15 MG PO TABS: 15.0000 mg | ORAL_TABLET | Freq: Two times a day (BID) | ORAL | 0 refills | Status: DC | PRN

## 2018-03-07 MED ORDER — MORPHINE SULFATE ER 15 MG PO T12A: 15.0000 mg | EXTENDED_RELEASE_TABLET | Freq: Three times a day (TID) | ORAL | 0 refills | Status: DC

## 2018-03-07 NOTE — Patient Instructions (Addendum)
RTC in 2-3 months for your annual exam.     DASH Eating Plan DASH stands for "Dietary Approaches to Stop Hypertension." The DASH eating plan is a healthy eating plan that has been shown to reduce high blood pressure (hypertension). It may also reduce your risk for type 2 diabetes, heart disease, and stroke. The DASH eating plan may also help with weight loss. What are tips for following this plan? General guidelines  Avoid eating more than 2,300 mg (milligrams) of salt (sodium) a day. If you have hypertension, you may need to reduce your sodium intake to 1,500 mg a day.  Limit alcohol intake to no more than 1 drink a day for nonpregnant women and 2 drinks a day for men. One drink equals 12 oz of beer, 5 oz of wine, or 1 oz of hard liquor.  Work with your health care provider to maintain a healthy body weight or to lose weight. Ask what an ideal weight is for you.  Get at least 30 minutes of exercise that causes your heart to beat faster (aerobic exercise) most days of the week. Activities may include walking, swimming, or biking.  Work with your health care provider or diet and nutrition specialist (dietitian) to adjust your eating plan to your individual calorie needs. Reading food labels  Check food labels for the amount of sodium per serving. Choose foods with less than 5 percent of the Daily Value of sodium. Generally, foods with less than 300 mg of sodium per serving fit into this eating plan.  To find whole grains, look for the word "whole" as the first word in the ingredient list. Shopping  Buy products labeled as "low-sodium" or "no salt added."  Buy fresh foods. Avoid canned foods and premade or frozen meals. Cooking  Avoid adding salt when cooking. Use salt-free seasonings or herbs instead of table salt or sea salt. Check with your health care provider or pharmacist before using salt substitutes.  Do not fry foods. Cook foods using healthy methods such as baking, boiling,  grilling, and broiling instead.  Cook with heart-healthy oils, such as olive, canola, soybean, or sunflower oil. Meal planning   Eat a balanced diet that includes: ? 5 or more servings of fruits and vegetables each day. At each meal, try to fill half of your plate with fruits and vegetables. ? Up to 6-8 servings of whole grains each day. ? Less than 6 oz of lean meat, poultry, or fish each day. A 3-oz serving of meat is about the same size as a deck of cards. One egg equals 1 oz. ? 2 servings of low-fat dairy each day. ? A serving of nuts, seeds, or beans 5 times each week. ? Heart-healthy fats. Healthy fats called Omega-3 fatty acids are found in foods such as flaxseeds and coldwater fish, like sardines, salmon, and mackerel.  Limit how much you eat of the following: ? Canned or prepackaged foods. ? Food that is high in trans fat, such as fried foods. ? Food that is high in saturated fat, such as fatty meat. ? Sweets, desserts, sugary drinks, and other foods with added sugar. ? Full-fat dairy products.  Do not salt foods before eating.  Try to eat at least 2 vegetarian meals each week.  Eat more home-cooked food and less restaurant, buffet, and fast food.  When eating at a restaurant, ask that your food be prepared with less salt or no salt, if possible. What foods are recommended? The items listed  may not be a complete list. Talk with your dietitian about what dietary choices are best for you. Grains Whole-grain or whole-wheat bread. Whole-grain or whole-wheat pasta. Brown rice. [place]. Bulgur. Whole-grain and low-sodium cereals. Pita bread. Low-fat, low-sodium crackers. Whole-wheat flour tortillas. Vegetables Fresh or frozen vegetables (raw, steamed, roasted, or grilled). Low-sodium or reduced-sodium tomato and vegetable juice. Low-sodium or reduced-sodium tomato sauce and tomato paste. Low-sodium or reduced-sodium canned vegetables. Fruits All fresh, dried, or frozen  fruit. Canned fruit in natural juice (without added sugar). Meat and other protein foods Skinless chicken or [place]. Ground chicken or [place]. Pork with fat trimmed off. Fish and seafood. Egg whites. Dried beans, peas, or lentils. Unsalted nuts, nut butters, and seeds. Unsalted canned beans. Lean cuts of beef with fat trimmed off. Low-sodium, lean deli meat. Dairy Low-fat (1%) or fat-free (skim) milk. Fat-free, low-fat, or reduced-fat cheeses. Nonfat, low-sodium ricotta or cottage cheese. Low-fat or nonfat yogurt. Low-fat, low-sodium cheese. Fats and oils Soft margarine without trans fats. Vegetable oil. Low-fat, reduced-fat, or light mayonnaise and salad dressings (reduced-sodium). Canola, safflower, olive, soybean, and sunflower oils. Avocado. Seasoning and other foods Herbs. Spices. Seasoning mixes without salt. Unsalted popcorn and pretzels. Fat-free sweets. What foods are not recommended? The items listed may not be a complete list. Talk with your dietitian about what dietary choices are best for you. Grains Baked goods made with fat, such as croissants, muffins, or some breads. Dry pasta or rice meal packs. Vegetables Creamed or fried vegetables. Vegetables in a cheese sauce. Regular canned vegetables (not low-sodium or reduced-sodium). Regular canned tomato sauce and paste (not low-sodium or reduced-sodium). Regular tomato and vegetable juice (not low-sodium or reduced-sodium). Angie Fava. Olives. Fruits Canned fruit in a light or heavy syrup. Fried fruit. Fruit in cream or butter sauce. Meat and other protein foods Fatty cuts of meat. Ribs. Fried meat. Berniece Salines. Sausage. Bologna and other processed lunch meats. Salami. Fatback. Hotdogs. Bratwurst. Salted nuts and seeds. Canned beans with added salt. Canned or smoked fish. Whole eggs or egg yolks. Chicken or [place] with skin. Dairy Whole or 2% milk, cream, and half-and-half. Whole or full-fat cream cheese. Whole-fat or sweetened yogurt. Full-fat  cheese. Nondairy creamers. Whipped toppings. Processed cheese and cheese spreads. Fats and oils Butter. Stick margarine. Lard. Shortening. Ghee. Bacon fat. Tropical oils, such as coconut, palm kernel, or palm oil. Seasoning and other foods Salted popcorn and pretzels. Onion salt, garlic salt, seasoned salt, table salt, and sea salt. Worcestershire sauce. Tartar sauce. Barbecue sauce. Teriyaki sauce. Soy sauce, including reduced-sodium. Steak sauce. Canned and packaged gravies. Fish sauce. Oyster sauce. Cocktail sauce. Horseradish that you find on the shelf. Ketchup. Mustard. Meat flavorings and tenderizers. Bouillon cubes. Hot sauce and Tabasco sauce. Premade or packaged marinades. Premade or packaged taco seasonings. Relishes. Regular salad dressings. Where to find more information:  National Heart, Lung, and [place]: https://wilson-eaton.com/  American Heart Association: www.heart.org Summary  The DASH eating plan is a healthy eating plan that has been shown to reduce high blood pressure (hypertension). It may also reduce your risk for type 2 diabetes, heart disease, and stroke.  With the DASH eating plan, you should limit salt (sodium) intake to 2,300 mg a day. If you have hypertension, you may need to reduce your sodium intake to 1,500 mg a day.  When on the DASH eating plan, aim to eat more fresh fruits and vegetables, whole grains, lean proteins, low-fat dairy, and heart-healthy fats.  Work with your health care provider or diet  and nutrition specialist (dietitian) to adjust your eating plan to your individual calorie needs. This information is not intended to replace advice given to you by your health care provider. Make sure you discuss any questions you have with your health care provider. Document Released: 05/26/2011 Document Revised: 05/30/2016 Document Reviewed: 05/30/2016 Elsevier Interactive Patient Education  2018 [employer].   IF you received an x-ray today, you will  receive an invoice from [facility]. Please contact [facility] at [phone] with questions or concerns regarding your invoice.   IF you received labwork today, you will receive an invoice from [place]. Please contact [facility] at [phone] with questions or concerns regarding your invoice.   Our billing staff will not be able to assist you with questions regarding bills from these companies.  You will be contacted with the lab results as soon as they are available. The fastest way to get your results is to activate your My Chart account. Instructions are located on the last page of this paperwork. If you have not heard from [place] regarding the results in 2 weeks, please contact this office.

## 2018-03-07 NOTE — Progress Notes (Signed)
Subjective:    Patient ID: [name] [name], female    DOB: Nov 25, 1955, 62 y.o.   MRN: [id number]  HPI: [name] [name] is a 62 year old female who returns for follow up appointment for chronic pain and medication refill. She states her pain is located in her lower back. She rates her pain 7. Her current exercise regime is walking and performing stretching exercises.   [name] [name] is 75.00 MME. Last Oral Swab was Performed on 12/14/2017, it was consistent.    Pain Inventory Average Pain 7 Pain Right Now 7 My pain is constant  In the last 24 hours, has pain interfered with the following? General activity 7 Relation with others 7 Enjoyment of life 7 What TIME of day is your pain at its worst? morning, daytime, night Sleep (in general) Fair  Pain is worse with: walking, sitting, standing and some activites Pain improves with: rest, heat/ice and medication Relief from Meds: 2  Mobility walk without assistance how many minutes can you walk? 10 ability to climb steps?  yes do you drive?  yes Do you have any goals in this area?  yes  Function not employed: date last employed . disabled: date disabled . I need assistance with the following:  household duties and shopping Do you have any goals in this area?  yes  Neuro/Psych spasms  Prior Studies Any changes since last visit?  no  Physicians involved in your care Any changes since last visit?  no   Family History  Problem Relation Age of Onset  . Heart disease Mother 61       CHF; CAD  . Hypertension Mother   . Heart failure Mother   . Diabetes Sister   . Lupus Sister   . Kidney failure Sister   . Hypertension Brother   . Hyperlipidemia Sister   . Hypertension Sister   . Colon cancer Neg Hx   . Rectal cancer Neg Hx    Social History   Socioeconomic History  . [name] status: Widowed    Spouse name: Not on file  . Number of children: 3  . Years of education: Not on file  .  Highest education level: Not on file  Occupational History  . 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 Financial resource strain: Not on file  . Food insecurity:    Worry: Not on file    Inability: Not on file  . Transportation needs:    Medical: Not on file    Non-medical: Not on file  Tobacco Use  . Smoking status: Former Smoker    Packs/day: 0.50    Years: 20.00    Pack years: 10.00    Types: Cigarettes    Last attempt to quit: 07/21/2008    Years since quitting: 9.6  . Smokeless tobacco: Never Used  Substance and Sexual Activity  . Alcohol use: No  . Drug use: No  . Sexual activity: Not on file  Lifestyle  . Physical activity:    Days per week: Not on file    Minutes per session: Not on file  . Stress: Not on file  Relationships  . Social connections:    Talks on phone: Not on file    Gets together: Not on file    Attends religious service: Not on file    Active member of club or organization: Not on file    Attends meetings of clubs  or organizations: Not on file    Relationship status: Not on file  Other Topics Concern  . Not on file  Social History Narrative   [name] [name]      Children: 3 children (45, 58, 64); 13 grandchildren; 1 gg      Employment: disability since 2009; retail      Lives: alone in [place] in apartment      Tobacco: quit in 2011      Alcohol: none      Drugs: none      Exercise: minimal in [name]      ADLs: independent with ADLs; drives      Seatbelt: 100%; no texting.   Past Surgical History:  Procedure Laterality Date  . ABDOMINAL HYSTERECTOMY     DUB; ovaries intact  . BREAST BIOPSY    . CARDIAC CATHETERIZATION  1990's   [name]  . COLON SURGERY  05/11/11   [facility] of adhesions Crohn's  . MASS EXCISION Left 01/03/2018   Procedure: EXCISION LEFT UPPER BACK LIPOMA ERAS PATH;  Surgeon: Erroll Luna, MD;  Location: [place];  Service: General;  Laterality: Left;  . REDUCTION MAMMAPLASTY    . SPINE SURGERY     Past Medical History:  Diagnosis Date  . Blood transfusion without reported diagnosis   . Crohn's disease ([place])   . Disorder of sacroiliac joint   . Hyperlipidemia   . Hypertension   . Ischemic colitis ([place])   . Lumbago   . Lumbar post-laminectomy syndrome   . Lumbosacral neuritis   . Lumbosacral radiculitis   . Sciatica    BP (!) 182/84 (BP Location: Left Arm, Patient Position: Sitting, Cuff Size: Large)   Pulse (!) 56   Resp 14   Ht 5\' 4"  (1.626 m)   Wt 220 lb (99.8 kg)   SpO2 97%   BMI 37.76 kg/m   Opioid Risk Score:   Fall Risk Score:  `1  Depression screen PHQ 2/9  Depression screen [facility] 2/9 10/18/2017 09/18/2017 07/19/2017 10/15/[name] 9/6/[name] 7/16/[name] 5/8/[name]  Decreased Interest 0 0 0 0 0 0 0  Down, Depressed, Hopeless 0 0 3 0 0 0 0  PHQ - 2 Score 0 0 3 0 0 0 0  Altered sleeping - 0 - - - - -  Tired, decreased energy - 0 - - - - -  Change in appetite - 0 - - - - -  Feeling bad or failure about yourself  - 0 - - - - -  Trouble concentrating - 0 - - - - -  Moving slowly or fidgety/restless - 0 - - - - -  Suicidal thoughts - 0 - - - - -  PHQ-9 Score - 0 - - - - -  Difficult doing work/chores - - - - - - -    Review of Systems  Constitutional: Negative.   HENT: Negative.   Eyes: Negative.   Respiratory: Negative.   Cardiovascular: Negative.   Gastrointestinal: Negative.   Endocrine: Negative.   Genitourinary: Negative.   Musculoskeletal: Positive for back pain.       Spasms  Skin: Negative.   Allergic/Immunologic: Negative.   Neurological: Negative.   Hematological: Negative.        Objective:   Physical Exam  Constitutional: She is oriented to person, place, and time. She appears well-developed and well-nourished.  HENT:  Head: Normocephalic and atraumatic.  Neck:  Normal range of motion. Neck supple.  Cardiovascular: Normal rate and regular rhythm.    Pulmonary/Chest: Effort normal and breath sounds normal.  Musculoskeletal:  Normal Muscle Bulk and Muscle Testing Reveals:  Upper Extremities: Full ROM and Muscle Strength 5/5 Thoracic Hypersensitivity: T-2-T-4  ( Post Surgical Lipoma Removal) Lumbar Paraspinal Tenderness: L-3-L-5 Lower Extremities: Full ROM and Muscle Strength 5/5 Arises from Table with ease Narrow Based Gait  Neurological: She is alert and oriented to person, place, and time.  Skin: Skin is warm and dry.  Psychiatric: She has a normal mood and affect. Her behavior is normal.  Nursing note and vitals reviewed.         Assessment & Plan:  1.Lumbar Postlaminectomy/ Spinal Stenosis Lumbar Region/ Post-Op Pain/Low back pain with radiating symptoms in L3-4 distribution: continues to be limited by pain. Refilled: MSIR 15 mg one tablet twice a day as needed for moderate to sever pain#60andMorphabond 15 mg Q 8 hours.03/07/2018. . We will continue the opioid monitoring program, this consists of regular clinic visits, examinations, urine drug screen, pill counts as well as use of [place] Controlled Substance Reporting System. Encouraged to Continue exercise regime.  2. Lumbar Radiculopathy: No complaints today.Continue to Monitor 03/07/2018 3. Chronic Right Knee Pain:No complaints today. Continue with Ice TherapyandVoltaren Gel. 03/07/2018 4. Muscle Spasm: Continuecurrent treatment: Tizanidine. 09//18/2019 5. Lipoma: S/P Left Lipoma Excision of upper back By Dr. Brantley Stage on 01/03/2018. Surgery Following. Continue to Monitor. 03/07/2018.  20 minutes of face to face patient care time was spent during this visit. All questions were encouraged and answered.  F/U in 1 month

## 2018-03-07 NOTE — Progress Notes (Signed)
[name] [name]  MRN: [id number] DOB: December 28, 1955  PCP: Dorise Hiss, PA-C  Subjective:  Pt is a 62 year old female PMH HTN, Crohn's disease, Postlaminectomy syndrome, Class 2 severe obesity, Rectal pain who presents to clinic for establish care. She is a former pt of Dr. Tamala Julian. Last OV 03/2017.  She is here today for medication refill of HCTZ and flonase.  HTN - HCTZ 21m. She has been out x 2 weeks - endorses recent headaches. Blood pressure today is 148/88.  BP ranges 137/72-147/62. Denies lightheadedness, dizziness, chronic headache, double vision, chest pain, shortness of breath, heart racing, palpitations, nausea, vomiting, abdominal pain, hematuria, lower leg swelling.  Chronic pain syndrome, s/p lumbar surgery. Sees pain specialist for pain management.   Colonoscopy 06/2017  Lab Results  Component Value Date   CHOL 158 04/03/2017   HDL 42 04/03/2017   LDLCALC 93 04/03/2017   TRIG 116 04/03/2017   CHOLHDL 3.8 04/03/2017    Review of Systems  Constitutional: Negative for diaphoresis and fatigue.  Respiratory: Negative for chest tightness and shortness of breath.   Cardiovascular: Negative for chest pain, palpitations and leg swelling.  Gastrointestinal: Negative for nausea and vomiting.  Allergic/Immunologic: Positive for environmental allergies.  Neurological: Positive for headaches. Negative for dizziness and light-headedness.    Patient Active Problem List   Diagnosis Date Noted  . Class 2 severe obesity due to excess calories with serious comorbidity and body mass index (BMI) of 36.0 to 36.9 in adult ([facility] 04/04/2017  . Bulging disc 02/04/2014  . Postlaminectomy syndrome, cervical region 12/09/2013  . Subacromial impingement of right shoulder 11/14/2013  . CN (constipation) 08/23/2012  . Cervical pain 01/26/2012  . Sacroiliac dysfunction 08/11/2011  . Postlaminectomy syndrome, lumbar region 08/11/2011  . Crohn's disease ([facility]   . Essential  hypertension 09/02/2008  . Regional enteritis ([facility] 09/02/2008    Current Outpatient Medications on File Prior to Visit  Medication Sig Dispense Refill  . diclofenac sodium (VOLTAREN) 1 % GEL Apply 2 g topically 4 (four) times daily. 4 Tube 2  . fluticasone (FLONASE) 50 MCG/ACT nasal spray PLACE 2 SPRAYS INTO BOTH NOSTRILS DAILY. 16 g 0  . hydrochlorothiazide (HYDRODIURIL) 25 MG tablet TAKE 1 TABLET (25 MG TOTAL) BY MOUTH DAILY. 30 tablet 0  . morphine (MSIR) 15 MG tablet Take 1 tablet (15 mg total) by mouth 2 (two) times daily as needed for severe pain. 60 tablet 0  . Morphine Sulfate ER (MORPHABOND ER) 15 MG T12A Take 15 mg by mouth 3 (three) times daily. 90 tablet 0  . naloxone (NARCAN) nasal spray 4 mg/0.1 mL Lethargic or respiratory depression after opioid use 1 kit 0  . PREMPRO 0.3-1.5 MG tablet TAKE 1 TABLET BY MOUTH EVERY OTHER DAY. 28 tablet 0  . promethazine (PHENERGAN) 25 MG tablet TAKE 1 TABLET (25 MG TOTAL) BY MOUTH EVERY 8 (EIGHT) HOURS AS NEEDED FOR NAUSEA OR VOMITING 60 tablet 3  . tiZANidine (ZANAFLEX) 2 MG tablet Take 1 tablet (2 mg total) by mouth 2 (two) times daily as needed for muscle spasms. 60 tablet 1   No current facility-administered medications on file prior to visit.     Allergies  Allergen Reactions  . Orudis [Ketoprofen] Hives, Itching and Rash    onSet: 07/30/1990     Objective:  BP (!) 148/88   Pulse 60   Temp 98.1 F (36.7 C) (Oral)   Resp 16   Ht '5\' 4"'  (1.626 m)   Wt 219 lb (  99.3 kg)   SpO2 97%   BMI 37.59 kg/m   Physical Exam  Constitutional: She is oriented to person, place, and time. No distress.  Cardiovascular: Normal rate, regular rhythm and normal heart sounds.  Neurological: She is alert and oriented to person, place, and time.  Skin: Skin is warm and dry.  Psychiatric: Judgment normal.  Vitals reviewed.   Assessment and Plan :  1. Essential hypertension - BP is typically controlled. She has been out due to confusion with PCP  leaving. Endorses mild HA x a few days. Okay to refill. Recent labs look good. RTC in 3 months for annual exam and labs - hydrochlorothiazide (HYDRODIURIL) 25 MG tablet; Take 1 tablet (25 mg total) by mouth daily.  Dispense: 90 tablet; Refill: 3  2. Nasal congestion - fluticasone (FLONASE) 50 MCG/ACT nasal spray; Place 2 sprays into both nostrils daily.  Dispense: 16 g; Refill: 0  3. Need for influenza vaccination - Administered by CMA - Flu Vaccine QUAD 36+ mos IM   Mercer Pod, PA-C  [facility] 03/07/2018 10:17 AM  Please note: Portions of this report may have been transcribed using dragon voice recognition software. Every effort was made to ensure accuracy; however, inadvertent computerized transcription errors may be present.

## 2018-03-27 ENCOUNTER — Other Ambulatory Visit: Payer: Self-pay | Admitting: Gastroenterology

## 2018-03-30 ENCOUNTER — Other Ambulatory Visit: Payer: Self-pay | Admitting: Gastroenterology

## 2018-04-02 NOTE — Telephone Encounter (Signed)
I refilled once.  As she does not need regular follow up with me, can see PCP for this afterwards.

## 2018-04-02 NOTE — Telephone Encounter (Signed)
Pt notified and aware. She is due to follow up with her primary next month and will have them take over the refills.

## 2018-04-02 NOTE — Telephone Encounter (Addendum)
Refill request for phenergan 25mg  1 every 8 hours as needed. Last seen 06-2017. No current follow up scheduled. Please advise.

## 2018-04-04 ENCOUNTER — Encounter: Payer: Self-pay | Admitting: Registered Nurse

## 2018-04-04 ENCOUNTER — Encounter: Payer: Medicare Other | Attending: Physical Medicine & Rehabilitation | Admitting: Registered Nurse

## 2018-04-04 VITALS — BP 126/89 | HR 66 | Resp 14 | Ht 64.0 in | Wt 218.0 lb

## 2018-04-04 DIAGNOSIS — M961 Postlaminectomy syndrome, not elsewhere classified: Secondary | ICD-10-CM | POA: Insufficient documentation

## 2018-04-04 DIAGNOSIS — M62838 Other muscle spasm: Secondary | ICD-10-CM | POA: Diagnosis not present

## 2018-04-04 DIAGNOSIS — Z79891 Long term (current) use of opiate analgesic: Secondary | ICD-10-CM

## 2018-04-04 DIAGNOSIS — Z5181 Encounter for therapeutic drug level monitoring: Secondary | ICD-10-CM

## 2018-04-04 DIAGNOSIS — M5414 Radiculopathy, thoracic region: Secondary | ICD-10-CM | POA: Insufficient documentation

## 2018-04-04 DIAGNOSIS — M778 Other enthesopathies, not elsewhere classified: Secondary | ICD-10-CM

## 2018-04-04 DIAGNOSIS — M5417 Radiculopathy, lumbosacral region: Secondary | ICD-10-CM | POA: Insufficient documentation

## 2018-04-04 DIAGNOSIS — M48061 Spinal stenosis, lumbar region without neurogenic claudication: Secondary | ICD-10-CM

## 2018-04-04 DIAGNOSIS — G894 Chronic pain syndrome: Secondary | ICD-10-CM | POA: Diagnosis not present

## 2018-04-04 DIAGNOSIS — R202 Paresthesia of skin: Secondary | ICD-10-CM | POA: Diagnosis not present

## 2018-04-04 DIAGNOSIS — M7581 Other shoulder lesions, right shoulder: Secondary | ICD-10-CM

## 2018-04-04 MED ORDER — TIZANIDINE HCL 2 MG PO TABS: 2.0000 mg | ORAL_TABLET | Freq: Two times a day (BID) | ORAL | 3 refills | Status: DC | PRN

## 2018-04-04 MED ORDER — TOPIRAMATE 25 MG PO TABS: 25.0000 mg | ORAL_TABLET | Freq: Every day | ORAL | 2 refills | Status: DC

## 2018-04-04 MED ORDER — MORPHINE SULFATE 15 MG PO TABS: 15.0000 mg | ORAL_TABLET | Freq: Two times a day (BID) | ORAL | 0 refills | Status: DC | PRN

## 2018-04-04 MED ORDER — MORPHINE SULFATE ER 15 MG PO T12A: 15.0000 mg | EXTENDED_RELEASE_TABLET | Freq: Three times a day (TID) | ORAL | 0 refills | Status: DC

## 2018-04-04 NOTE — Progress Notes (Signed)
Subjective:    Patient ID: [name] [name], female    DOB: Nov 16, 1955, 63 y.o.   MRN: [id number]  HPI: [name] [name] is a 63 year old female who is here for follow up appointment for chronic pain and medication refill. She states her pain is located in her right shoulder and upper to lower back. She rates her pain 7. Her current exercise regime is walking.   Ms. Welker Morphine Equivalent is 75.00 MME. Last Oral Swab was Performed on 12/14/2017, it was consistent.    Pain Inventory Average Pain 8 Pain Right Now 7 My pain is constant, sharp, stabbing and aching  In the last 24 hours, has pain interfered with the following? General activity 7 Relation with others 7 Enjoyment of life 7 What TIME of day is your pain at its worst? all Sleep (in general) Fair  Pain is worse with: walking, bending, sitting, inactivity and standing Pain improves with: rest, heat/ice and medication Relief from Meds: 2  Mobility walk without assistance how many minutes can you walk? 5 ability to climb steps?  yes do you drive?  yes Do you have any goals in this area?  yes  Function not employed: date last employed 03/08 disabled: date disabled 05/09 I need assistance with the following:  household duties and shopping Do you have any goals in this area?  yes  Neuro/Psych spasms  Prior Studies Any changes since last visit?  no  Physicians involved in your care Any changes since last visit?  no   Family History  Problem Relation Age of Onset  . Heart disease Mother 55       CHF; CAD  . Hypertension Mother   . Heart failure Mother   . Diabetes Sister   . Lupus Sister   . Kidney failure Sister   . Hypertension Brother   . Hyperlipidemia Sister   . Hypertension Sister   . Colon cancer Neg Hx   . Rectal cancer Neg Hx    Social History   Socioeconomic History  . Marital status: Widowed    Spouse name: Not on file  . Number of children: 3  . Years of education: Not on  file  . Highest education level: Not on file  Occupational History  . 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 Financial resource strain: Not on file  . Food insecurity:    Worry: Not on file    Inability: Not on file  . Transportation needs:    Medical: Not on file    Non-medical: Not on file  Tobacco Use  . Smoking status: Former Smoker    Packs/day: 0.50    Years: 20.00    Pack years: 10.00    Types: Cigarettes    Last attempt to quit: 07/21/2008    Years since quitting: 9.7  . Smokeless tobacco: Never Used  Substance and Sexual Activity  . Alcohol use: No  . Drug use: No  . Sexual activity: Not on file  Lifestyle  . Physical activity:    Days per week: Not on file    Minutes per session: Not on file  . Stress: Not on file  Relationships  . Social connections:    Talks on phone: Not on file    Gets together: Not on file    Attends religious service: Not on file    Active member of club or organization: Not on file  Attends meetings of clubs or organizations: Not on file    Relationship status: Not on file  Other Topics Concern  . Not on file  Social History Narrative   Marital status: widowed since 2002; not dating which is bad in 2018      Children: 3 children (45, 54, 15); 13 grandchildren; 1 gg      Employment: disability since 2009; retail      Lives: alone in [place] in apartment      Tobacco: quit in 2011      Alcohol: none      Drugs: none      Exercise: minimal in 2018      ADLs: independent with ADLs; drives      Seatbelt: 100%; no texting.   Past Surgical History:  Procedure Laterality Date  . ABDOMINAL HYSTERECTOMY     DUB; ovaries intact  . BREAST BIOPSY    . CARDIAC CATHETERIZATION  1990's   [name]  . COLON SURGERY  05/11/11   [facility] of adhesions Crohn's  . MASS EXCISION Left 01/03/2018   Procedure: EXCISION LEFT UPPER BACK LIPOMA ERAS PATH;  Surgeon: Erroll Luna, MD;  Location: [facility];  Service: General;  Laterality: Left;  . REDUCTION MAMMAPLASTY    . SPINE SURGERY     Past Medical History:  Diagnosis Date  . Blood transfusion without reported diagnosis   . Crohn's disease ([place])   . Disorder of sacroiliac joint   . Hyperlipidemia   . Hypertension   . Ischemic colitis ([place])   . Lumbago   . Lumbar post-laminectomy syndrome   . Lumbosacral neuritis   . Lumbosacral radiculitis   . Sciatica    BP 126/89 (BP Location: Right Arm, Patient Position: Sitting, Cuff Size: Large)   Pulse 66   Resp 14   Ht 5\' 4"  (1.626 m)   Wt 218 lb (98.9 kg)   SpO2 96%   BMI 37.42 kg/m   Opioid Risk Score:   Fall Risk Score:  `1  Depression screen PHQ 2/9  Depression screen [facility] 2/9 03/07/2018 10/18/2017 09/18/2017 07/19/2017 04/03/2017 02/23/2017 01/02/2017  Decreased Interest 0 0 0 0 0 0 0  Down, Depressed, Hopeless 0 0 0 3 0 0 0  PHQ - 2 Score 0 0 0 3 0 0 0  Altered sleeping - - 0 - - - -  Tired, decreased energy - - 0 - - - -  Change in appetite - - 0 - - - -  Feeling bad or failure about yourself  - - 0 - - - -  Trouble concentrating - - 0 - - - -  Moving slowly or fidgety/restless - - 0 - - - -  Suicidal thoughts - - 0 - - - -  PHQ-9 Score - - 0 - - - -  Difficult doing work/chores - - - - - - -    Review of Systems  Constitutional: Negative.   HENT: Negative.   Eyes: Negative.   Respiratory: Negative.   Cardiovascular: Negative.   Gastrointestinal: Negative.   Endocrine: Negative.   Genitourinary: Negative.   Musculoskeletal: Positive for arthralgias and back pain.       Spasms   Skin: Negative.   Allergic/Immunologic: Negative.   Neurological: Negative.   Hematological: Negative.   Psychiatric/Behavioral: Negative.        Objective:   Physical Exam  Constitutional: She is oriented to person, place, and time. She appears well-developed and well-nourished.  HENT:  Head: Normocephalic and atraumatic.  Neck: Normal range of motion. Neck supple.   Cardiovascular: Normal rate and regular rhythm.  Pulmonary/Chest: Effort normal and breath sounds normal.  Musculoskeletal:  Normal Muscle Bulk and Muscle Testing Reveals: Upper Extremities: Right: Decreased ROM 90 Degrees and Muscle Strength 5/5 Right AC Joint Tenderness Left: Full ROM and Muscle Strength 5/5 Lumbar Paraspinal Tenderness: L-4-L-5 Lower Extremities: Full ROM and Muscle Strength 5/5 Arises from Table with ease Narrow Based gait  Neurological: She is alert and oriented to person, place, and time.  Skin: Skin is warm and dry.  Psychiatric: She has a normal mood and affect. Her behavior is normal.  Nursing note and vitals reviewed.         Assessment & Plan:  1.Lumbar Postlaminectomy/ Spinal Stenosis Lumbar Region/ Post-Op Pain/Low back pain with radiating symptoms in L3-4 distribution: continues to be limited by pain. Refilled: MSIR 15 mg one tablet twice a day as needed for moderate to sever pain#60andMorphabond 15 mg Q 8 hours.04/04/2018. . We will continue the opioid monitoring program, this consists of regular clinic visits, examinations, urine drug screen, pill counts as well as use of [place] Controlled Substance Reporting System. Encouraged to Continue exercise regime.  2. Lumbar Radiculopathy: No complaints today.Continue to Monitor 04/04/2018 3. Chronic Right Knee Pain:No complaints today. Continue with Ice TherapyandVoltaren Gel. 04/04/2018 4. Muscle Spasm: Continuecurrent treatment: Tizanidine. 10//16/2019 5. Lipoma: S/P Left Lipoma Excision of upper back By Dr. Brantley Stage on 01/03/2018. Surgery Following. Continue to Monitor. 04/04/2018. 6. Paresthesia of Skin" RX: Topamax  20 minutes of face to face patient care time was spent during this visit. All questions were encouraged and answered.  F/U in 1 month

## 2018-04-06 ENCOUNTER — Ambulatory Visit: Payer: Medicare Other | Admitting: Registered Nurse

## 2018-04-18 ENCOUNTER — Telehealth: Payer: Self-pay

## 2018-04-18 NOTE — Telephone Encounter (Signed)
Pt called stating the new medication that you gave her at her last visit is working for her.

## 2018-04-25 ENCOUNTER — Telehealth: Payer: Self-pay | Admitting: Physician Assistant

## 2018-04-25 NOTE — Telephone Encounter (Signed)
Copied from [place] [id number]. Topic: Quick Communication - Rx Refill/Question >> Apr 25, 2018 12:40 PM Virl Axe D wrote: Medication: promethazine (PHENERGAN) 25 MG tablet / hydrochlorothiazide (HYDRODIURIL) 25 MG tablet - Pt states she would like an alternative blood pressure RX because the hydrochlorothiazide was found to have cancer causing ingredients. Pt is out of both medications and does has appt scheduled for 05/08/18 with Dr. Nolon Rod Please advise Cb# [phone]  Has the patient contacted their pharmacy? No. (Agent: If no, request that the patient contact the pharmacy for the refill.) (Agent: If yes, when and what did the pharmacy advise?)  Preferred Pharmacy (with phone number or street name): [id number] [address], [place] [phone] (Phone) [phone] (Fax)    Agent: Please be advised that RX refills may take up to 3 business days. We ask that you follow-up with your pharmacy.

## 2018-04-25 NOTE — Telephone Encounter (Signed)
Pt requesting an alternative BP Rx because of cancer causing ingredients being found in the hydrochlorothiazide.   See attached.  Also requesting Phenergan 25 mg tablet.  PCP:  [place]

## 2018-04-30 ENCOUNTER — Other Ambulatory Visit: Payer: Self-pay | Admitting: Physician Assistant

## 2018-04-30 DIAGNOSIS — I10 Essential (primary) hypertension: Secondary | ICD-10-CM

## 2018-04-30 MED ORDER — AMLODIPINE BESYLATE 5 MG PO TABS: 5.0000 mg | ORAL_TABLET | Freq: Every day | ORAL | 0 refills | Status: DC

## 2018-04-30 NOTE — Telephone Encounter (Signed)
Stop HCTZ. Start Norvasc 5mg . Keep appt with [facility] 11/19.  She can inquire about Phenergan Rx at that appt. I don't know why she is nauseous and this needs to be discussed.  Thank you

## 2018-05-01 NOTE — Telephone Encounter (Signed)
Spoke with patient advised Voiced understanding

## 2018-05-08 ENCOUNTER — Ambulatory Visit (HOSPITAL_BASED_OUTPATIENT_CLINIC_OR_DEPARTMENT_OTHER): Payer: Medicare Other | Admitting: Physical Medicine & Rehabilitation

## 2018-05-08 ENCOUNTER — Encounter: Payer: Self-pay | Admitting: Family Medicine

## 2018-05-08 ENCOUNTER — Ambulatory Visit (INDEPENDENT_AMBULATORY_CARE_PROVIDER_SITE_OTHER): Payer: Medicare Other | Admitting: Family Medicine

## 2018-05-08 ENCOUNTER — Encounter: Payer: Self-pay | Admitting: Physical Medicine & Rehabilitation

## 2018-05-08 ENCOUNTER — Encounter: Payer: Medicare Other | Attending: Physical Medicine & Rehabilitation

## 2018-05-08 ENCOUNTER — Other Ambulatory Visit: Payer: Self-pay

## 2018-05-08 ENCOUNTER — Ambulatory Visit: Payer: Medicare Other | Admitting: Physical Medicine & Rehabilitation

## 2018-05-08 VITALS — BP 153/90 | HR 60 | Ht 64.0 in | Wt 218.0 lb

## 2018-05-08 VITALS — BP 159/65 | HR 57 | Temp 98.4°F | Resp 16 | Ht 64.0 in | Wt 218.8 lb

## 2018-05-08 DIAGNOSIS — M5417 Radiculopathy, lumbosacral region: Secondary | ICD-10-CM | POA: Diagnosis present

## 2018-05-08 DIAGNOSIS — M7501 Adhesive capsulitis of right shoulder: Secondary | ICD-10-CM

## 2018-05-08 DIAGNOSIS — R42 Dizziness and giddiness: Secondary | ICD-10-CM

## 2018-05-08 DIAGNOSIS — I10 Essential (primary) hypertension: Secondary | ICD-10-CM | POA: Diagnosis not present

## 2018-05-08 DIAGNOSIS — M961 Postlaminectomy syndrome, not elsewhere classified: Secondary | ICD-10-CM | POA: Insufficient documentation

## 2018-05-08 DIAGNOSIS — R002 Palpitations: Secondary | ICD-10-CM

## 2018-05-08 DIAGNOSIS — M5414 Radiculopathy, thoracic region: Secondary | ICD-10-CM | POA: Insufficient documentation

## 2018-05-08 MED ORDER — CHLORTHALIDONE 25 MG PO TABS: 12.5000 mg | ORAL_TABLET | Freq: Every day | ORAL | 1 refills | Status: DC

## 2018-05-08 MED ORDER — MORPHINE SULFATE 15 MG PO TABS: 15.0000 mg | ORAL_TABLET | Freq: Two times a day (BID) | ORAL | 0 refills | Status: DC | PRN

## 2018-05-08 MED ORDER — POTASSIUM CHLORIDE 20 MEQ PO PACK: 20.0000 meq | PACK | Freq: Every day | ORAL | 1 refills | Status: DC

## 2018-05-08 MED ORDER — MORPHINE SULFATE ER 15 MG PO T12A: 15.0000 mg | EXTENDED_RELEASE_TABLET | Freq: Three times a day (TID) | ORAL | 0 refills | Status: DC

## 2018-05-08 NOTE — Patient Instructions (Addendum)
 If you have lab work done today you will be contacted with your lab results within the next 2 weeks.  If you have not heard from us then please contact us. The fastest way to get your results is to register for My Chart.   IF you received an x-ray today, you will receive an invoice from [facility]. Please contact [facility] at [phone] with questions or concerns regarding your invoice.   IF you received labwork today, you will receive an invoice from [facility]. Please contact [facility] at [phone] with questions or concerns regarding your invoice.   Our billing staff will not be able to assist you with questions regarding bills from these companies.  You will be contacted with the lab results as soon as they are available. The fastest way to get your results is to activate your My Chart account. Instructions are located on the last page of this paperwork. If you have not heard from us regarding the results in 2 weeks, please contact this office.    Dizziness Dizziness is a common problem. It is a feeling of unsteadiness or light-headedness. You may feel like you are about to faint. Dizziness can lead to injury if you stumble or fall. Anyone can become dizzy, but dizziness is more common in older adults. This condition can be caused by a number of things, including medicines, dehydration, or illness. Follow these instructions at home: Eating and drinking  Drink enough fluid to keep your urine clear or pale yellow. This helps to keep you from becoming dehydrated. Try to drink more clear fluids, such as water.  Do not drink alcohol.  Limit your caffeine intake if told to do so by your health care provider. Check ingredients and nutrition facts to see if a food or beverage contains caffeine.  Limit your salt (sodium) intake if told to do so by your health care provider. Check ingredients and nutrition facts to see if a food or beverage contains  sodium. Activity  Avoid making quick movements. ? Rise slowly from chairs and steady yourself until you feel okay. ? In the morning, first sit up on the side of the bed. When you feel okay, stand slowly while you hold onto something until you know that your balance is fine.  If you need to stand in one place for a long time, move your legs often. Tighten and relax the muscles in your legs while you are standing.  Do not drive or use heavy machinery if you feel dizzy.  Avoid bending down if you feel dizzy. Place items in your home so that they are easy for you to reach without leaning over. Lifestyle  Do not use any products that contain nicotine or tobacco, such as cigarettes and e-cigarettes. If you need help quitting, ask your health care provider.  Try to reduce your stress level by using methods such as yoga or meditation. Talk with your health care provider if you need help to manage your stress. General instructions  Watch your dizziness for any changes.  Take over-the-counter and prescription medicines only as told by your health care provider. Talk with your health care provider if you think that your dizziness is caused by a medicine that you are taking.  Tell a friend or a family member that you are feeling dizzy. If he or she notices any changes in your behavior, have this person call your health care provider.  Keep all follow-up visits as told by your health care provider.   This is important. Contact a health care provider if:  Your dizziness does not go away.  Your dizziness or light-headedness gets worse.  You feel nauseous.  You have reduced hearing.  You have new symptoms.  You are unsteady on your feet or you feel like the room is spinning. Get help right away if:  You vomit or have diarrhea and are unable to eat or drink anything.  You have problems talking, walking, swallowing, or using your arms, hands, or legs.  You feel generally weak.  You are not  thinking clearly or you have trouble forming sentences. It may take a friend or family member to notice this.  You have chest pain, abdominal pain, shortness of breath, or sweating.  Your vision changes.  You have any bleeding.  You have a severe headache.  You have neck pain or a stiff neck.  You have a fever. These symptoms may represent a serious problem that is an emergency. Do not wait to see if the symptoms will go away. Get medical help right away. Call your local emergency services (911 in the [place] Do not drive yourself to the hospital. Summary  Dizziness is a feeling of unsteadiness or light-headedness. This condition can be caused by a number of things, including medicines, dehydration, or illness.  Anyone can become dizzy, but dizziness is more common in older adults.  Drink enough fluid to keep your urine clear or pale yellow. Do not drink alcohol.  Avoid making quick movements if you feel dizzy. Monitor your dizziness for any changes. This information is not intended to replace advice given to you by your health care provider. Make sure you discuss any questions you have with your health care provider. Document Released: 11/30/2000 Document Revised: 07/09/2016 Document Reviewed: 07/09/2016 Elsevier Interactive Patient Education  2018 Elsevier Inc.  

## 2018-05-08 NOTE — Progress Notes (Signed)
  Right shoulder pain x 1.5 months , did ok with Right shoujlder injection for a couple years, no falls or new injuries  Pain persists despite pain meds  MSIR 15 mg twice daily Morphabond 15 mg 3 times daily Tizanidine 2 mg twice daily  Unable to take NSAIDs due to GI issues  Shoulder injection Right  without ultrasound guidance  Pain with external rotation as well as forward flexion of the shoulder as well as abduction. No signs of trauma.  He has normal strength in the right hand elbow flexors and extensors as well.  Indication:RIGHT Shoulder pain not relieved by medication management and other conservative care.  Informed consent was obtained after describing risks and benefits of the procedure with the patient, this includes bleeding, bruising, infection and medication side effects. The patient wishes to proceed and has given written consent. Patient was placed in a seated position. The Right shoulder was marked and prepped with betadine in the subacromial area. A 25-gauge 1-1/2 inch needle was inserted into the subacromial area. After negative draw back for blood, a solution containing 1 mL of 6 mg per ML betamethasone and 4 mL of 1% lidocaine was injected. A band aid was applied. The patient tolerated the procedure well. Post procedure instructions were given.  Patient required a series of 3 injections in 2016 to help with her right shoulder pain which was related to adhesive capsulitis.  We will schedule patient back in 2 weeks pain still is present only to reinject Exercise given  Indication for chronic opioid: Lumbar post lami syndrome Medication and dose: MSIR 15 mg twice daily Morphabond 15 mg 3 times daily # pills per month: 60 and 90 Last UDS date: 12/14/2017 Opioid Treatment Agreement signed (Y/N): y Opioid Treatment Agreement last reviewed with patient:   [facility] reviewed this encounter (include red flags):  Darreld Mclean

## 2018-05-08 NOTE — Patient Instructions (Signed)
Shoulder Exercises Ask your health care provider which exercises are safe for you. Do exercises exactly as told by your health care provider and adjust them as directed. It is normal to feel mild stretching, pulling, tightness, or discomfort as you do these exercises, but you should stop right away if you feel sudden pain or your pain gets worse.Do not begin these exercises until told by your health care provider. RANGE OF MOTION EXERCISES These exercises warm up your muscles and joints and improve the movement and flexibility of your shoulder. These exercises also help to relieve pain, numbness, and tingling. These exercises involve stretching your injured shoulder directly. Exercise A: Pendulum  1. Stand near a wall or a surface that you can hold onto for balance. 2. Bend at the waist and let your left / right arm hang straight down. Use your other arm to support you. Keep your back straight and do not lock your knees. 3. Relax your left / right arm and shoulder muscles, and move your hips and your trunk so your left / right arm swings freely. Your arm should swing because of the motion of your body, not because you are using your arm or shoulder muscles. 4. Keep moving your body so your arm swings in the following directions, as told by your health care provider: ? Side to side. ? Forward and backward. ? In clockwise and counterclockwise circles. 5. Continue each motion for __________ seconds, or for as long as told by your health care provider. 6. Slowly return to the starting position. Repeat __________ times. Complete this exercise __________ times a day. Exercise B:Flexion, Standing  1. Stand and hold a broomstick, a cane, or a similar object. Place your hands a little more than shoulder-width apart on the object. Your left / right hand should be palm-up, and your other hand should be palm-down. 2. Keep your elbow straight and keep your shoulder muscles relaxed. Push the stick down with  your healthy arm to raise your left / right arm in front of your body, and then over your head until you feel a stretch in your shoulder. ? Avoid shrugging your shoulder while you raise your arm. Keep your shoulder blade tucked down toward the middle of your back. 3. Hold for __________ seconds. 4. Slowly return to the starting position. Repeat __________ times. Complete this exercise __________ times a day. Exercise C: Abduction, Standing 1. Stand and hold a broomstick, a cane, or a similar object. Place your hands a little more than shoulder-width apart on the object. Your left / right hand should be palm-up, and your other hand should be palm-down. 2. While keeping your elbow straight and your shoulder muscles relaxed, push the stick across your body toward your left / right side. Raise your left / right arm to the side of your body and then over your head until you feel a stretch in your shoulder. ? Do not raise your arm above shoulder height, unless your health care provider tells you to do that. ? Avoid shrugging your shoulder while you raise your arm. Keep your shoulder blade tucked down toward the middle of your back. 3. Hold for __________ seconds. 4. Slowly return to the starting position. Repeat __________ times. Complete this exercise __________ times a day. Exercise D:Internal Rotation  1. Place your left / right hand behind your back, palm-up. 2. Use your other hand to dangle an exercise band, a towel, or a similar object over your shoulder. Grasp the band with   your left / right hand so you are holding onto both ends. 3. Gently pull up on the band until you feel a stretch in the front of your left / right shoulder. ? Avoid shrugging your shoulder while you raise your arm. Keep your shoulder blade tucked down toward the middle of your back. 4. Hold for __________ seconds. 5. Release the stretch by letting go of the band and lowering your hands. Repeat __________ times. Complete  this exercise __________ times a day. STRETCHING EXERCISES These exercises warm up your muscles and joints and improve the movement and flexibility of your shoulder. These exercises also help to relieve pain, numbness, and tingling. These exercises are done using your healthy shoulder to help stretch the muscles of your injured shoulder. Exercise E: Corner Stretch (External Rotation and Abduction)  1. Stand in a doorway with one of your feet slightly in front of the other. This is called a staggered stance. If you cannot reach your forearms to the door frame, stand facing a corner of a room. 2. Choose one of the following positions as told by your health care provider: ? Place your hands and forearms on the door frame above your head. ? Place your hands and forearms on the door frame at the height of your head. ? Place your hands on the door frame at the height of your elbows. 3. Slowly move your weight onto your front foot until you feel a stretch across your chest and in the front of your shoulders. Keep your head and chest upright and keep your abdominal muscles tight. 4. Hold for __________ seconds. 5. To release the stretch, shift your weight to your back foot. Repeat __________ times. Complete this stretch __________ times a day. Exercise F:Extension, Standing 1. Stand and hold a broomstick, a cane, or a similar object behind your back. ? Your hands should be a little wider than shoulder-width apart. ? Your palms should face away from your back. 2. Keeping your elbows straight and keeping your shoulder muscles relaxed, move the stick away from your body until you feel a stretch in your shoulder. ? Avoid shrugging your shoulders while you move the stick. Keep your shoulder blade tucked down toward the middle of your back. 3. Hold for __________ seconds. 4. Slowly return to the starting position. Repeat __________ times. Complete this exercise __________ times a day. STRENGTHENING  EXERCISES These exercises build strength and endurance in your shoulder. Endurance is the ability to use your muscles for a long time, even after they get tired. Exercise G:External Rotation  1. Sit in a stable chair without armrests. 2. Secure an exercise band at elbow height on your left / right side. 3. Place a soft object, such as a folded towel or a small pillow, between your left / right upper arm and your body to move your elbow a few inches away (about 10 cm) from your side. 4. Hold the end of the band so it is tight and there is no slack. 5. Keeping your elbow pressed against the soft object, move your left / right forearm out, away from your abdomen. Keep your body steady so only your forearm moves. 6. Hold for __________ seconds. 7. Slowly return to the starting position. Repeat __________ times. Complete this exercise __________ times a day. Exercise H:Shoulder Abduction  1. Sit in a stable chair without armrests, or stand. 2. Hold a __________ weight in your left / right hand, or hold an exercise band with both hands.   3. Start with your arms straight down and your left / right palm facing in, toward your body. 4. Slowly lift your left / right hand out to your side. Do not lift your hand above shoulder height unless your health care provider tells you that this is safe. ? Keep your arms straight. ? Avoid shrugging your shoulder while you do this movement. Keep your shoulder blade tucked down toward the middle of your back. 5. Hold for __________ seconds. 6. Slowly lower your arm, and return to the starting position. Repeat __________ times. Complete this exercise __________ times a day. Exercise I:Shoulder Extension 1. Sit in a stable chair without armrests, or stand. 2. Secure an exercise band to a stable object in front of you where it is at shoulder height. 3. Hold one end of the exercise band in each hand. Your palms should face each other. 4. Straighten your elbows and  lift your hands up to shoulder height. 5. Step back, away from the secured end of the exercise band, until the band is tight and there is no slack. 6. Squeeze your shoulder blades together as you pull your hands down to the sides of your thighs. Stop when your hands are straight down by your sides. Do not let your hands go behind your body. 7. Hold for __________ seconds. 8. Slowly return to the starting position. Repeat __________ times. Complete this exercise __________ times a day. Exercise J:Standing Shoulder Row 1. Sit in a stable chair without armrests, or stand. 2. Secure an exercise band to a stable object in front of you so it is at waist height. 3. Hold one end of the exercise band in each hand. Your palms should be in a thumbs-up position. 4. Bend each of your elbows to an "L" shape (about 90 degrees) and keep your upper arms at your sides. 5. Step back until the band is tight and there is no slack. 6. Slowly pull your elbows back behind you. 7. Hold for __________ seconds. 8. Slowly return to the starting position. Repeat __________ times. Complete this exercise __________ times a day. Exercise K:Shoulder Press-Ups  1. Sit in a stable chair that has armrests. Sit upright, with your feet flat on the floor. 2. Put your hands on the armrests so your elbows are bent and your fingers are pointing forward. Your hands should be about even with the sides of your body. 3. Push down on the armrests and use your arms to lift yourself off of the chair. Straighten your elbows and lift yourself up as much as you comfortably can. ? Move your shoulder blades down, and avoid letting your shoulders move up toward your ears. ? Keep your feet on the ground. As you get stronger, your feet should support less of your body weight as you lift yourself up. 4. Hold for __________ seconds. 5. Slowly lower yourself back into the chair. Repeat __________ times. Complete this exercise __________ times a  day. Exercise L: Wall Push-Ups  1. Stand so you are facing a stable wall. Your feet should be about one arm-length away from the wall. 2. Lean forward and place your palms on the wall at shoulder height. 3. Keep your feet flat on the floor as you bend your elbows and lean forward toward the wall. 4. Hold for __________ seconds. 5. Straighten your elbows to push yourself back to the starting position. Repeat __________ times. Complete this exercise __________ times a day. This information is not intended to replace advice   given to you by your health care provider. Make sure you discuss any questions you have with your health care provider. Document Released: 04/20/2005 Document Revised: 02/29/2016 Document Reviewed: 02/15/2015 Elsevier Interactive Patient Education  2018 [employer]  

## 2018-05-08 NOTE — Progress Notes (Signed)
Established Patient Office Visit  Subjective:  Patient ID: [name] [name], female    DOB: 1955/11/05  Age: 62 y.o. MRN: [id number]  CC:  Chief Complaint  Patient presents with  . Annual Exam    no pap  . norvasc issues    excessive sweating, heart palpitations, dizziness    HPI [name] [name] presents for establishing care with a new provider since her previous Provider has left the practice. 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  She states that her hypertension was previously well controlled on her dose of HCTZ. She reports that she saw a commercial on tv that the HCTZ had a cancer causing ingredient.  She reports that she stopped taking this medication because she was concerned about this cancer risk. She was calling the office to get a new medication prescribed and during that time she was out of the medications. Her blood pressure was elevated and she had palpitations.  She reports that she was finally prescribed amlodipine instead and when she brought home the medication the side effects label said "may cause heart attack, dizziness and high blood pressure".  She reports that she took the amlodipine as instructed and noted that she had dizziness that seems to be positional when she turned her head to the side.  She felt like the room was spinning. She did not take the medication today because she cannot drive while taking this medication.   Past Medical History:  Diagnosis Date  . Blood transfusion without reported diagnosis   . Crohn's disease ([place])   . Disorder of sacroiliac joint   . Hyperlipidemia   . Hypertension   . Ischemic colitis ([place])   . Lumbago   . Lumbar post-laminectomy syndrome   . Lumbosacral neuritis   . Lumbosacral radiculitis   . Sciatica     Past Surgical History:  Procedure Laterality Date  . ABDOMINAL HYSTERECTOMY     DUB; ovaries intact  . BREAST  BIOPSY    . CARDIAC CATHETERIZATION  1990's   [name]  . COLON SURGERY  05/11/11   [facility] of adhesions Crohn's  . MASS EXCISION Left 01/03/2018   Procedure: EXCISION LEFT UPPER BACK LIPOMA ERAS PATH;  Surgeon: Erroll Luna, MD;  Location: [facility];  Service: General;  Laterality: Left;  . REDUCTION MAMMAPLASTY    . SPINE SURGERY      Family History  Problem Relation Age of Onset  . Heart disease Mother 74       CHF; CAD  . Hypertension Mother   . Heart failure Mother   . Diabetes Sister   . Lupus Sister   . Kidney failure Sister   . Hypertension Brother   . Hyperlipidemia Sister   . Hypertension Sister   . Colon cancer Neg Hx   . Rectal cancer Neg Hx     Social History   Socioeconomic History  . Marital status: Widowed    Spouse name: Not on file  . Number of children: 3  . Years of education: Not on file  . Highest education level: Not on file  Occupational History  . 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 Financial resource strain: Not on file  . Food insecurity:    Worry: Not on file    Inability: Not on file  . Transportation needs:    Medical: Not on  file    Non-medical: Not on file  Tobacco Use  . Smoking status: Former Smoker    Packs/day: 0.50    Years: 20.00    Pack years: 10.00    Types: Cigarettes    Last attempt to quit: 07/21/2008    Years since quitting: 9.8  . Smokeless tobacco: Never Used  Substance and Sexual Activity  . Alcohol use: No  . Drug use: No  . Sexual activity: Not on file  Lifestyle  . Physical activity:    Days per week: Not on file    Minutes per session: Not on file  . Stress: Not on file  Relationships  . Social connections:    Talks on phone: Not on file    Gets together: Not on file    Attends religious service: Not on file    Active member of club or organization: Not on file    Attends meetings of clubs or organizations: Not on file    Relationship  status: Not on file  . Intimate partner violence:    Fear of current or ex partner: Not on file    Emotionally abused: Not on file    Physically abused: Not on file    Forced sexual activity: Not on file  Other Topics Concern  . Not on file  Social History Narrative   Marital status: widowed since 2002; not dating which is bad in 2018      Children: 3 children (45, 86, 53); 13 grandchildren; 1 gg      Employment: disability since 2009; retail      Lives: alone in [place] in apartment      Tobacco: quit in 2011      Alcohol: none      Drugs: none      Exercise: minimal in 2018      ADLs: independent with ADLs; drives      Seatbelt: 100%; no texting.    Outpatient Medications Prior to Visit  Medication Sig Dispense Refill  . diclofenac sodium (VOLTAREN) 1 % GEL Apply 2 g topically 4 (four) times daily. 4 Tube 2  . fluticasone (FLONASE) 50 MCG/ACT nasal spray Place 2 sprays into both nostrils daily. 16 g 0  . morphine (MSIR) 15 MG tablet Take 1 tablet (15 mg total) by mouth 2 (two) times daily as needed for severe pain. 60 tablet 0  . Morphine Sulfate ER (MORPHABOND ER) 15 MG T12A Take 15 mg by mouth 3 (three) times daily. 90 tablet 0  . naloxone (NARCAN) nasal spray 4 mg/0.1 mL Lethargic or respiratory depression after opioid use 1 kit 0  . PREMPRO 0.3-1.5 MG tablet TAKE 1 TABLET BY MOUTH EVERY OTHER DAY. 28 tablet 0  . promethazine (PHENERGAN) 25 MG tablet TAKE 1 TABLET (25 MG TOTAL) BY MOUTH EVERY 8 (EIGHT) HOURS AS NEEDED FOR NAUSEA OR VOMITING 30 tablet 0  . tiZANidine (ZANAFLEX) 2 MG tablet Take 1 tablet (2 mg total) by mouth 2 (two) times daily as needed for muscle spasms. 60 tablet 3  . topiramate (TOPAMAX) 25 MG tablet Take 1 tablet (25 mg total) by mouth at bedtime. 30 tablet 2  . amLODipine (NORVASC) 5 MG tablet Take 1 tablet (5 mg total) by mouth daily. 30 tablet 0  . hydrochlorothiazide (HYDRODIURIL) 25 MG tablet Take 1 tablet (25 mg total) by mouth daily. 90 tablet 3    No facility-administered medications prior to visit.     Allergies  Allergen Reactions  . Orudis [  Ketoprofen] Hives, Itching and Rash    onSet: 07/30/1990    ROS Review of Systems  Constitutional: Negative for chills and fever.  Respiratory: Negative for apnea, choking and chest tightness.   Cardiovascular: Positive for palpitations. Negative for chest pain.  Endocrine: Negative for cold intolerance and heat intolerance.  Neurological: Positive for dizziness and light-headedness. Negative for facial asymmetry, speech difficulty, numbness and headaches.      Objective:     BP (!) 159/65 (BP Location: Right Arm, Patient Position: Sitting, Cuff Size: Large)   Pulse (!) 57   Temp 98.4 F (36.9 C) (Oral)   Resp 16   Ht '5\' 4"'  (1.626 m)   Wt 218 lb 12.8 oz (99.2 kg)   SpO2 99%   BMI 37.56 kg/m  Wt Readings from Last 3 Encounters:  05/08/18 218 lb 12.8 oz (99.2 kg)  05/08/18 218 lb (98.9 kg)  04/04/18 218 lb (98.9 kg)    Physical Exam  Physical Exam  Constitutional: He is oriented to person, place, and time. He appears well-developed and well-nourished.  HENT:  Head: Normocephalic and atraumatic.  Eyes: Conjunctivae and EOM are normal.  Cardiovascular: Normal rate, regular rhythm, normal heart sounds and intact distal pulses.  No murmur heard. Pulmonary/Chest: Effort normal and breath sounds normal. No stridor. No respiratory distress. He has no wheezes.  Abdomen: nondistended with normoactive bs, soft, nontender Neurological: He is alert and oriented to person, place, and time.  Skin: Skin is warm. Capillary refill takes less than 2 seconds.  Psychiatric: He has a normal mood and affect. His behavior is normal. Judgment and thought content normal.   ECG with sinus brachycardia Unchanged compared to ECG from 2017   Health Maintenance Due  Topic Date Due  . PAP SMEAR  07/30/1977    There are no preventive care reminders to display for this patient.  Lab  Results  Component Value Date   TSH 0.622  Test methodology is 3rd generation TSH 08/21/2008   Lab Results  Component Value Date   WBC 4.3 04/03/2017   HGB 14.4 01/03/2018   HCT 39.7 04/03/2017   MCV 84 04/03/2017   PLT 304 04/03/2017   Lab Results  Component Value Date   NA 139 05/08/2018   K 4.5 05/08/2018   CO2 21 05/08/2018   GLUCOSE 110 (H) 05/08/2018   BUN 11 05/08/2018   CREATININE 0.95 05/08/2018   BILITOT 0.8 05/08/2018   ALKPHOS 94 05/08/2018   AST 19 05/08/2018   ALT 14 05/08/2018   PROT 7.5 05/08/2018   ALBUMIN 4.6 05/08/2018   CALCIUM 9.2 05/08/2018   ANIONGAP 6 01/01/2018   Lab Results  Component Value Date   CHOL 158 04/03/2017   Lab Results  Component Value Date   HDL 42 04/03/2017   Lab Results  Component Value Date   LDLCALC 93 04/03/2017   Lab Results  Component Value Date   TRIG 116 04/03/2017   Lab Results  Component Value Date   CHOLHDL 3.8 04/03/2017   Lab Results  Component Value Date   HGBA1C 5.8 (H) 03/09/2016      Assessment & Plan:   Problem List Items Addressed This Visit      Cardiovascular and Mediastinum   Essential hypertension    Changed amlodipine to Chlorthalidone since pt tolerated the HCTZ so well and only stopped due to a drug recall. Discussed that chlorthalidone has side effect profile that include dizziness and risk of gout and hyperkalemia She should take  K as directed. Follow up in one month for bp check       Relevant Medications   chlorthalidone (HYGROTON) 25 MG tablet     Other   Dizziness - Primary    Given history of vertigo and description of symptoms it seems that patient had a flare of vertigo. No other neurologic concerns on exam. ECG stable.       Relevant Orders   CMP14+EGFR (Completed)   EKG 12-Lead (Completed)   Heart palpitations    Discussed that we will also check electrolytes today. Palpitations tends to occur when her bp is high. Started chlorthalidone. Follow up in one month.       Relevant Orders   CMP14+EGFR (Completed)   EKG 12-Lead (Completed)      Meds ordered this encounter  Medications  . chlorthalidone (HYGROTON) 25 MG tablet    Sig: Take 0.5 tablets (12.5 mg total) by mouth daily.    Dispense:  30 tablet    Refill:  1  . potassium chloride (KLOR-CON) 20 MEQ packet    Sig: Take 20 mEq by mouth daily.    Dispense:  30 packet    Refill:  1    Follow-up: Return in about 4 weeks (around 06/05/2018) for blood pressure.    Forrest Moron, MD

## 2018-05-09 LAB — CMP14+EGFR
A/G RATIO: 1.6 (ref 1.2–2.2)
ALK PHOS: 94 IU/L (ref 39–117)
ALT: 14 IU/L (ref 0–32)
AST: 19 IU/L (ref 0–40)
Albumin: 4.6 g/dL (ref 3.6–4.8)
BUN/Creatinine Ratio: 12 (ref 12–28)
BUN: 11 mg/dL (ref 8–27)
Bilirubin Total: 0.8 mg/dL (ref 0.0–1.2)
CO2: 21 mmol/L (ref 20–29)
Calcium: 9.2 mg/dL (ref 8.7–10.3)
Chloride: 105 mmol/L (ref 96–106)
Creatinine, Ser: 0.95 mg/dL (ref 0.57–1.00)
GFR calc Af Amer: 75 mL/min/{1.73_m2} (ref >59)
GFR calc non Af Amer: 65 mL/min/{1.73_m2} (ref >59)
GLOBULIN, TOTAL: 2.9 g/dL (ref 1.5–4.5)
Glucose: 110 mg/dL — ABNORMAL HIGH (ref 65–99)
Potassium: 4.5 mmol/L (ref 3.5–5.2)
SODIUM: 139 mmol/L (ref 134–144)
Total Protein: 7.5 g/dL (ref 6.0–8.5)

## 2018-05-09 NOTE — Assessment & Plan Note (Signed)
Given history of vertigo and description of symptoms it seems that patient had a flare of vertigo. No other neurologic concerns on exam. ECG stable.

## 2018-05-09 NOTE — Assessment & Plan Note (Signed)
Discussed that we will also check electrolytes today. Palpitations tends to occur when her bp is high. Started chlorthalidone. Follow up in one month.

## 2018-05-09 NOTE — Assessment & Plan Note (Signed)
Changed amlodipine to Chlorthalidone since pt tolerated the HCTZ so well and only stopped due to a drug recall. Discussed that chlorthalidone has side effect profile that include dizziness and risk of gout and hyperkalemia She should take K as directed. Follow up in one month for bp check

## 2018-05-28 ENCOUNTER — Encounter

## 2018-05-28 ENCOUNTER — Encounter: Payer: Self-pay | Admitting: Physical Medicine & Rehabilitation

## 2018-05-28 ENCOUNTER — Ambulatory Visit: Payer: Medicare Other | Admitting: Physical Medicine & Rehabilitation

## 2018-05-28 VITALS — BP 144/89 | HR 76 | Ht 64.0 in | Wt 215.0 lb

## 2018-05-28 DIAGNOSIS — M5414 Radiculopathy, thoracic region: Secondary | ICD-10-CM | POA: Insufficient documentation

## 2018-05-28 DIAGNOSIS — M5417 Radiculopathy, lumbosacral region: Secondary | ICD-10-CM | POA: Insufficient documentation

## 2018-05-28 DIAGNOSIS — M7501 Adhesive capsulitis of right shoulder: Secondary | ICD-10-CM | POA: Diagnosis not present

## 2018-05-28 DIAGNOSIS — M961 Postlaminectomy syndrome, not elsewhere classified: Secondary | ICD-10-CM | POA: Insufficient documentation

## 2018-05-28 MED ORDER — MORPHINE SULFATE 15 MG PO TABS: 15.0000 mg | ORAL_TABLET | Freq: Two times a day (BID) | ORAL | 0 refills | Status: DC | PRN

## 2018-05-28 MED ORDER — MORPHINE SULFATE ER 15 MG PO T12A: 15.0000 mg | EXTENDED_RELEASE_TABLET | Freq: Three times a day (TID) | ORAL | 0 refills | Status: DC

## 2018-05-28 NOTE — Progress Notes (Signed)
Shoulder injection Right glenohumeral Under ultrasound guidance  Indication:RIght Shoulder pain not relieved by medication management and other conservative care.  Informed consent was obtained after describing risks and benefits of the procedure with the patient, this includes bleeding, bruising, infection and medication side effects. The patient wishes to proceed and has given written consent. Patient was placed in a seated position. The Right shoulder was marked and prepped with betadine in the subacromial area. A 25-gauge 1-1/2 inch needle was inserted into the subacromial area. After negative draw back for blood, a solution containing 1 mL of 6 mg per ML betamethasone and 4 mL of 1% lidocaine was injected. A band aid was applied. The patient tolerated the procedure well. Post procedure instructions were given.  Indication for chronic opioid: Lumbar post lami syndrome Medication and dose: MSIR 15 mg twice daily Morphabond 15 mg 3 times daily # pills per month: 60 and 90 Last UDS date: 12/14/2017 Opioid Treatment Agreement signed (Y/N): y Opioid Treatment Agreement last reviewed with patient:   [facility] reviewed this encounter (include red flags):  Y 05/28/18

## 2018-05-28 NOTE — Patient Instructions (Signed)
Will need to do 1 more injection if pain has not subsided

## 2018-06-07 ENCOUNTER — Ambulatory Visit: Payer: Medicare Other | Admitting: Family Medicine

## 2018-06-08 ENCOUNTER — Encounter: Payer: Medicare Other | Attending: Physical Medicine & Rehabilitation | Admitting: Registered Nurse

## 2018-06-08 ENCOUNTER — Encounter: Payer: Self-pay | Admitting: Registered Nurse

## 2018-06-08 VITALS — BP 130/82 | HR 64 | Wt 218.0 lb

## 2018-06-08 DIAGNOSIS — M5417 Radiculopathy, lumbosacral region: Secondary | ICD-10-CM | POA: Diagnosis not present

## 2018-06-08 DIAGNOSIS — M7501 Adhesive capsulitis of right shoulder: Secondary | ICD-10-CM | POA: Diagnosis not present

## 2018-06-08 DIAGNOSIS — Z5181 Encounter for therapeutic drug level monitoring: Secondary | ICD-10-CM

## 2018-06-08 DIAGNOSIS — M5414 Radiculopathy, thoracic region: Secondary | ICD-10-CM | POA: Diagnosis not present

## 2018-06-08 DIAGNOSIS — G894 Chronic pain syndrome: Secondary | ICD-10-CM

## 2018-06-08 DIAGNOSIS — M48061 Spinal stenosis, lumbar region without neurogenic claudication: Secondary | ICD-10-CM | POA: Diagnosis not present

## 2018-06-08 DIAGNOSIS — Z79891 Long term (current) use of opiate analgesic: Secondary | ICD-10-CM

## 2018-06-08 DIAGNOSIS — M961 Postlaminectomy syndrome, not elsewhere classified: Secondary | ICD-10-CM

## 2018-06-08 DIAGNOSIS — M62838 Other muscle spasm: Secondary | ICD-10-CM

## 2018-06-08 NOTE — Progress Notes (Signed)
Subjective:    Patient ID: [name] [name], female    DOB: October 06, 1955, 62 y.o.   MRN: [id number]  HPI: [name] [name] is a 62 y.o. female who returns for follow up appointment for chronic pain and medication refill. She states her pain is located in her right shoulder and lower back. She rates her pain 8. Her current exercise regime is walking and performing stretching exercises.  [name] [name] Morphine equivalent is 75.00 MME. Last Oral Swab was Performed on 12/14/2017, it was consistent.   Pain Inventory Average Pain 8 Pain Right Now 8 My pain is constant, stabbing and aching  In the last 24 hours, has pain interfered with the following? General activity 8 Relation with others 8 Enjoyment of life 8 What TIME of day is your pain at its worst? all Sleep (in general) Fair  Pain is worse with: walking, standing and some activites Pain improves with: rest, heat/ice and medication Relief from Meds: 2  Mobility walk without assistance how many minutes can you walk? 10 ability to climb steps?  yes do you drive?  yes Do you have any goals in this area?  yes  Function disabled: date disabled . I need assistance with the following:  household duties Do you have any goals in this area?  yes  Neuro/Psych spasms  Prior Studies Any changes since last visit?  no  Physicians involved in your care Any changes since last visit?  no   Family History  Problem Relation Age of Onset  . Heart disease Mother 25       CHF; CAD  . Hypertension Mother   . Heart failure Mother   . Diabetes Sister   . Lupus Sister   . Kidney failure Sister   . Hypertension Brother   . Hyperlipidemia Sister   . Hypertension Sister   . Colon cancer Neg Hx   . Rectal cancer Neg Hx    Social History   Socioeconomic History  . Marital status: Widowed    Spouse name: Not on file  . Number of children: 3  . Years of education: Not on file  . Highest education level: Not on file  Occupational  History  . 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 Financial resource strain: Not on file  . Food insecurity:    Worry: Not on file    Inability: Not on file  . Transportation needs:    Medical: Not on file    Non-medical: Not on file  Tobacco Use  . Smoking status: Former Smoker    Packs/day: 0.50    Years: 20.00    Pack years: 10.00    Types: Cigarettes    Last attempt to quit: 07/21/2008    Years since quitting: 9.8  . Smokeless tobacco: Never Used  Substance and Sexual Activity  . Alcohol use: No  . Drug use: No  . Sexual activity: Not on file  Lifestyle  . Physical activity:    Days per week: Not on file    Minutes per session: Not on file  . Stress: Not on file  Relationships  . Social connections:    Talks on phone: Not on file    Gets together: Not on file    Attends religious service: Not on file    Active member of club or organization: Not on file    Attends meetings of clubs or organizations: Not on file    Relationship  status: Not on file  Other Topics Concern  . Not on file  Social History Narrative   Marital status: widowed since 2002; not dating which is bad in 2018      Children: 3 children (45, 51, 50); 13 grandchildren; 1 gg      Employment: disability since 2009; retail      Lives: alone in [place] in apartment      Tobacco: quit in 2011      Alcohol: none      Drugs: none      Exercise: minimal in 2018      ADLs: independent with ADLs; drives      Seatbelt: 100%; no texting.   Past Surgical History:  Procedure Laterality Date  . ABDOMINAL HYSTERECTOMY     DUB; ovaries intact  . BREAST BIOPSY    . CARDIAC CATHETERIZATION  1990's   [name]  . COLON SURGERY  05/11/11   [facility] L[facility] of adhesions Crohn's  . MASS EXCISION Left 01/03/2018   Procedure: EXCISION LEFT UPPER BACK LIPOMA ERAS PATH;  Surgeon: Erroll Luna, MD;  Location: [facility];  Service: General;  Laterality: Left;  .  REDUCTION MAMMAPLASTY    . SPINE SURGERY     Past Medical History:  Diagnosis Date  . Blood transfusion without reported diagnosis   . Crohn's disease ([place])   . Disorder of sacroiliac joint   . Hyperlipidemia   . Hypertension   . Ischemic colitis ([place])   . Lumbago   . Lumbar post-laminectomy syndrome   . Lumbosacral neuritis   . Lumbosacral radiculitis   . Sciatica    BP 130/82   Pulse (!) 55   Wt 218 lb (98.9 kg)   SpO2 96%   BMI 37.42 kg/m   Opioid Risk Score:   Fall Risk Score:  `1  Depression screen PHQ 2/9  Depression screen [facility] 2/9 05/08/2018 03/07/2018 10/18/2017 09/18/2017 07/19/2017 04/03/2017 02/23/2017  Decreased Interest 0 0 0 0 0 0 0  Down, Depressed, Hopeless 0 0 0 0 3 0 0  PHQ - 2 Score 0 0 0 0 3 0 0  Altered sleeping - - - 0 - - -  Tired, decreased energy - - - 0 - - -  Change in appetite - - - 0 - - -  Feeling bad or failure about yourself  - - - 0 - - -  Trouble concentrating - - - 0 - - -  Moving slowly or fidgety/restless - - - 0 - - -  Suicidal thoughts - - - 0 - - -  PHQ-9 Score - - - 0 - - -  Difficult doing work/chores - - - - - - -    Review of Systems  Constitutional: Negative.   HENT: Negative.   Eyes: Negative.   Respiratory: Negative.   Gastrointestinal: Negative.   Endocrine: Negative.   Genitourinary: Negative.   Musculoskeletal: Positive for back pain.       Spasms   Skin: Negative.   Allergic/Immunologic: Negative.   Neurological: Negative.   Hematological: Negative.   Psychiatric/Behavioral: Negative.   All other systems reviewed and are negative.      Objective:   Physical Exam Vitals signs and nursing note reviewed.  Constitutional:      Appearance: Normal appearance.  Neck:     Musculoskeletal: Normal range of motion and neck supple.  Cardiovascular:     Rate and Rhythm: Regular rhythm.     Pulses: Normal  pulses.     Heart sounds: Normal heart sounds.  Pulmonary:     Effort: Pulmonary effort is normal.      Breath sounds: Normal breath sounds.  Musculoskeletal:     Comments: Normal Muscle Bulk and Muscle Testing Reveals: Upper Extremities: Right: Decreased ROM 45 Degrees and Muscle Strength 5/5  Left: Full ROM and Muscle Strength 5/5 Lumbar Paraspinal Tenderness: L-3-L-5 Lower Extremities: Full ROM and Muscle Strength 5/5  Arises from table with ease Narrow Based Gait   Skin:    General: Skin is warm and dry.  Neurological:     Mental Status: She is alert and oriented to person, place, and time.  Psychiatric:        Mood and Affect: Mood normal.        Behavior: Behavior normal.           Assessment & Plan:  1.Lumbar Postlaminectomy/ Spinal Stenosis Lumbar Region/ Post-Op Pain/Low back pain with radiating symptoms in L3-4 distribution: continues to be limited by pain. Refilled:MSIR 15 mg one tablet twice a day as needed for moderate to sever pain#60andMorphabond 15 mg Q 8 hours.06/08/2018. . We will continue the opioid monitoring program, this consists of regular clinic visits, examinations, urine drug screen, pill counts as well as use of [place] Controlled Substance Reporting System. Encouraged to Continue exercise regime.  2. Lumbar Radiculopathy: No complaints today.Continue to Monitor 06/08/2018 3. Chronic Right Knee Pain:No complaints today. Continue with Ice TherapyandVoltaren Gel. 06/08/2018 4. Muscle Spasm: Continuecurrent treatment: Tizanidine. 12//20/2019 5. Lipoma: S/P Left Lipoma Excision of upper back By Dr. Brantley Stage on 01/03/2018. Surgery Following. Continue to Monitor. 06/08/2018. 6. Paresthesia of Skin: Continue Topamax. 06/08/2018 7. Adhesive Capsulitis of Right Shoulder: S/P Shoulder injection. Continue to monitor. Continue HEP as Tolerated.   20 minutes of face to face patient care time was spent during this visit. All questions were encouraged and answered.  F/U in 1 month

## 2018-06-24 ENCOUNTER — Other Ambulatory Visit: Payer: Self-pay | Admitting: Physician Assistant

## 2018-06-24 DIAGNOSIS — I10 Essential (primary) hypertension: Secondary | ICD-10-CM

## 2018-07-02 ENCOUNTER — Ambulatory Visit: Payer: Medicare Other | Admitting: Physical Medicine & Rehabilitation

## 2018-07-02 ENCOUNTER — Encounter: Payer: Self-pay | Admitting: Physical Medicine & Rehabilitation

## 2018-07-02 ENCOUNTER — Encounter: Payer: Medicare Other | Attending: Physical Medicine & Rehabilitation

## 2018-07-02 VITALS — BP 116/79 | HR 72 | Ht 64.0 in | Wt 213.0 lb

## 2018-07-02 DIAGNOSIS — M961 Postlaminectomy syndrome, not elsewhere classified: Secondary | ICD-10-CM | POA: Insufficient documentation

## 2018-07-02 DIAGNOSIS — M7501 Adhesive capsulitis of right shoulder: Secondary | ICD-10-CM

## 2018-07-02 DIAGNOSIS — M5417 Radiculopathy, lumbosacral region: Secondary | ICD-10-CM | POA: Insufficient documentation

## 2018-07-02 DIAGNOSIS — M5414 Radiculopathy, thoracic region: Secondary | ICD-10-CM | POA: Insufficient documentation

## 2018-07-02 MED ORDER — MORPHINE SULFATE 15 MG PO TABS: 15.0000 mg | ORAL_TABLET | Freq: Two times a day (BID) | ORAL | 0 refills | Status: DC | PRN

## 2018-07-02 MED ORDER — MORPHINE SULFATE ER 15 MG PO T12A: 15.0000 mg | EXTENDED_RELEASE_TABLET | Freq: Three times a day (TID) | ORAL | 0 refills | Status: DC

## 2018-07-02 NOTE — Progress Notes (Signed)
Shoulder injection Right glenohumeral Under ultrasound guidance  Indication:RIght Shoulder pain not relieved by medication management and other conservative care.  Informed consent was obtained after describing risks and benefits of the procedure with the patient, this includes bleeding, bruising, infection and medication side effects. The patient wishes to proceed and has given written consent. Patient was placed in a seated position. The Right shoulder was marked and prepped with betadine in the subacromial area. A 25-gauge 1-1/2 inch needle was inserted into the subacromial area to anethetize skin and subq tissues.  An 34mm ECHO Bloc needle was inserted under direct [place] visualization using 4 Hz curviinear probe. . After negative draw back for blood, a solution containing 1 mL of 6 mg per ML betamethasone and 4 mL of 1% lidocaine was injected. A band aid was applied. The patient tolerated the procedure well. Post procedure instructions were given.  Indication for chronic opioid: Lumbar post lami syndrome Medication and dose: MSIR 15 mg twice daily Morphabond 15 mg 3 times daily # pills per month: 60 and 90 Last UDS date: 12/14/2017 Opioid Treatment Agreement signed (Y/N): y Opioid Treatment Agreement last reviewed with patient:   N[facility] reviewed this encounter (include red flags):  Y 07/02/18

## 2018-07-19 ENCOUNTER — Ambulatory Visit: Payer: Medicare Other | Admitting: Family Medicine

## 2018-07-30 ENCOUNTER — Encounter: Payer: Self-pay | Admitting: Registered Nurse

## 2018-07-30 ENCOUNTER — Encounter: Payer: Medicare Other | Attending: Physical Medicine & Rehabilitation | Admitting: Registered Nurse

## 2018-07-30 VITALS — BP 139/84 | HR 60 | Ht 64.0 in | Wt 212.0 lb

## 2018-07-30 DIAGNOSIS — M5417 Radiculopathy, lumbosacral region: Secondary | ICD-10-CM | POA: Insufficient documentation

## 2018-07-30 DIAGNOSIS — G894 Chronic pain syndrome: Secondary | ICD-10-CM

## 2018-07-30 DIAGNOSIS — M5414 Radiculopathy, thoracic region: Secondary | ICD-10-CM | POA: Insufficient documentation

## 2018-07-30 DIAGNOSIS — Z79891 Long term (current) use of opiate analgesic: Secondary | ICD-10-CM

## 2018-07-30 DIAGNOSIS — M48061 Spinal stenosis, lumbar region without neurogenic claudication: Secondary | ICD-10-CM | POA: Diagnosis not present

## 2018-07-30 DIAGNOSIS — Z5181 Encounter for therapeutic drug level monitoring: Secondary | ICD-10-CM

## 2018-07-30 DIAGNOSIS — M62838 Other muscle spasm: Secondary | ICD-10-CM | POA: Diagnosis not present

## 2018-07-30 DIAGNOSIS — M961 Postlaminectomy syndrome, not elsewhere classified: Secondary | ICD-10-CM | POA: Insufficient documentation

## 2018-07-30 DIAGNOSIS — M7501 Adhesive capsulitis of right shoulder: Secondary | ICD-10-CM

## 2018-07-30 MED ORDER — MORPHINE SULFATE ER 15 MG PO T12A: 15.0000 mg | EXTENDED_RELEASE_TABLET | Freq: Three times a day (TID) | ORAL | 0 refills | Status: DC

## 2018-07-30 MED ORDER — MORPHINE SULFATE 15 MG PO TABS: 15.0000 mg | ORAL_TABLET | Freq: Two times a day (BID) | ORAL | 0 refills | Status: DC | PRN

## 2018-07-30 NOTE — Progress Notes (Signed)
Subjective:    Patient ID: [name] [name], female    DOB: 1955-08-30, 63 y.o.   MRN: [id number]  HPI: [name] [name] is a 63 y.o. female who returns for follow up appointment for chronic pain and medication refill. She  states her pain is located in her lower back. She rates her pain 8. Hercurrent exercise regime is walking and performing stretching exercises.  [name] [name] Morphine equivalent is 75.00 MME.  Last Oral Swab was Performed on 12/14/2017, it was consistent.   Pain Inventory Average Pain 8 Pain Right Now 8 My pain is na  In the last 24 hours, has pain interfered with the following? General activity 7 Relation with others 7 Enjoyment of life 7 What TIME of day is your pain at its worst? morning Sleep (in general) Fair  Pain is worse with: walking and standing Pain improves with: rest, heat/ice and medication Relief from Meds: 1  Mobility ability to climb steps?  yes do you drive?  yes  Function disabled: date disabled 2008  Neuro/Psych spasms  Prior Studies Any changes since last visit?  no  Physicians involved in your care Any changes since last visit?  no   Family History  Problem Relation Age of Onset  . Heart disease Mother 51       CHF; CAD  . Hypertension Mother   . Heart failure Mother   . Diabetes Sister   . Lupus Sister   . Kidney failure Sister   . Hypertension Brother   . Hyperlipidemia Sister   . Hypertension Sister   . Colon cancer Neg Hx   . Rectal cancer Neg Hx    Social History   Socioeconomic History  . Marital status: Widowed    Spouse name: Not on file  . Number of children: 3  . Years of education: Not on file  . Highest education level: Not on file  Occupational History  . 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 Financial resource strain: Not on file  . Food insecurity:    Worry: Not on file    Inability: Not on file  . Transportation needs:    Medical: Not on file   Non-medical: Not on file  Tobacco Use  . Smoking status: Former Smoker    Packs/day: 0.50    Years: 20.00    Pack years: 10.00    Types: Cigarettes    Last attempt to quit: 07/21/2008    Years since quitting: 10.0  . Smokeless tobacco: Never Used  Substance and Sexual Activity  . Alcohol use: No  . Drug use: No  . Sexual activity: Not on file  Lifestyle  . Physical activity:    Days per week: Not on file    Minutes per session: Not on file  . Stress: Not on file  Relationships  . Social connections:    Talks on phone: Not on file    Gets together: Not on file    Attends religious service: Not on file    Active member of club or organization: Not on file    Attends meetings of clubs or organizations: Not on file    Relationship status: Not on file  Other Topics Concern  . Not on file  Social History Narrative   Marital status: widowed since 2002; not dating which is bad in 2018      Children: 3 children (45, 79, 40); 13 grandchildren; 1 gg  Employment: disability since 2009; retail      Lives: alone in [place] in apartment      Tobacco: quit in 2011      Alcohol: none      Drugs: none      Exercise: minimal in 2018      ADLs: independent with ADLs; drives      Seatbelt: 100%; no texting.   Past Surgical History:  Procedure Laterality Date  . ABDOMINAL HYSTERECTOMY     DUB; ovaries intact  . BREAST BIOPSY    . CARDIAC CATHETERIZATION  1990's   [name]  . COLON SURGERY  05/11/11   [facility] Lysis of adhesions Crohn's  . MASS EXCISION Left 01/03/2018   Procedure: EXCISION LEFT UPPER BACK LIPOMA ERAS PATH;  Surgeon: Erroll Luna, MD;  Location: [facility];  Service: General;  Laterality: Left;  . REDUCTION MAMMAPLASTY    . SPINE SURGERY     Past Medical History:  Diagnosis Date  . Blood transfusion without reported diagnosis   . Crohn's disease ([place])   . Disorder of sacroiliac joint   . Hyperlipidemia   . Hypertension   . Ischemic colitis  ([place])   . Lumbago   . Lumbar post-laminectomy syndrome   . Lumbosacral neuritis   . Lumbosacral radiculitis   . Sciatica    There were no vitals taken for this visit.  Opioid Risk Score:   Fall Risk Score:  `1  Depression screen PHQ 2/9  Depression screen [facility] 2/9 05/08/2018 03/07/2018 10/18/2017 09/18/2017 07/19/2017 04/03/2017 02/23/2017  Decreased Interest 0 0 0 0 0 0 0  Down, Depressed, Hopeless 0 0 0 0 3 0 0  PHQ - 2 Score 0 0 0 0 3 0 0  Altered sleeping - - - 0 - - -  Tired, decreased energy - - - 0 - - -  Change in appetite - - - 0 - - -  Feeling bad or failure about yourself  - - - 0 - - -  Trouble concentrating - - - 0 - - -  Moving slowly or fidgety/restless - - - 0 - - -  Suicidal thoughts - - - 0 - - -  PHQ-9 Score - - - 0 - - -  Difficult doing work/chores - - - - - - -     Review of Systems  Constitutional: Negative.   HENT: Negative.   Eyes: Negative.   Respiratory: Negative.   Cardiovascular: Negative.   Gastrointestinal: Negative.   Endocrine: Negative.   Genitourinary: Negative.   Musculoskeletal: Positive for arthralgias, back pain and myalgias.  Skin: Negative.   Allergic/Immunologic: Negative.   Neurological: Negative.   Hematological: Negative.   Psychiatric/Behavioral: Negative.   All other systems reviewed and are negative.      Objective:   Physical Exam Vitals signs and nursing note reviewed.  Constitutional:      Appearance: Normal appearance.  Neck:     Musculoskeletal: Normal range of motion and neck supple.  Cardiovascular:     Rate and Rhythm: Normal rate and regular rhythm.     Pulses: Normal pulses.     Heart sounds: Normal heart sounds.  Pulmonary:     Breath sounds: Normal breath sounds.  Musculoskeletal:     Comments: Normal Muscle Bulk and Muscle Testing Reveals:  Upper Extremities: Right: Decreased ROM 45 Degrees and Muscle Strength 5/5 Left; Full ROM and Muscle Strength 5/5   Lumbar Paraspinal Tenderness: L-3-L-5 Lower  Extremities: Full  ROM and Muscle Strength 5/5 Arises from Table with ease Narrow Based  Gait   Skin:    General: Skin is warm and dry.  Neurological:     Mental Status: She is alert and oriented to person, place, and time.  Psychiatric:        Mood and Affect: Mood normal.        Behavior: Behavior normal.           Assessment & Plan:  1.Lumbar Postlaminectomy/ Spinal Stenosis Lumbar Region/ Post-Op Pain/Low back pain with radiating symptoms in L3-4 distribution: continues to be limited by pain. Refilled:MSIR 15 mg one tablet twice a day as needed for moderate to sever pain#60andMorphabond 15 mg Q 8 hours.07/30/2018. We will continue the opioid monitoring program, this consists of regular clinic visits, examinations, urine drug screen, pill counts as well as use of [place] Controlled Substance Reporting System. Encouraged to Continue exercise regime.  2. Lumbar Radiculopathy: No complaints today.Continue to Monitor02/03/2019 3. Chronic Right Knee Pain:No complaints today. Continue with Ice TherapyandVoltaren Gel. 07/30/2018 4. Muscle Spasm: Continuecurrent treatment: Tizanidine.02//03/2019 5. Lipoma: S/P Left Lipoma Excision of upper back By Dr. Brantley Stage on 01/03/2018. Surgery Following. Continue to Monitor. 07/30/2017. 6. Paresthesia of Skin: Continue Topamax. 07/30/2018 7. Adhesive Capsulitis of Right Shoulder: S/P U/S Cortisone injection with some relief noted. . Continue to monitor. Continue HEP as Tolerated. 07/30/2018  20 minutes of face to face patient care time was spent during this visit. All questions were encouraged and answered.  F/U in 1 month

## 2018-08-20 ENCOUNTER — Other Ambulatory Visit: Payer: Self-pay | Admitting: Registered Nurse

## 2018-08-24 ENCOUNTER — Encounter: Payer: Self-pay | Admitting: Family Medicine

## 2018-08-24 ENCOUNTER — Ambulatory Visit (INDEPENDENT_AMBULATORY_CARE_PROVIDER_SITE_OTHER): Payer: Medicare Other | Admitting: Family Medicine

## 2018-08-24 ENCOUNTER — Telehealth: Payer: Self-pay | Admitting: Family Medicine

## 2018-08-24 VITALS — BP 130/82 | HR 77 | Temp 99.0°F | Resp 17 | Ht 64.0 in | Wt 218.0 lb

## 2018-08-24 DIAGNOSIS — Z5181 Encounter for therapeutic drug level monitoring: Secondary | ICD-10-CM

## 2018-08-24 DIAGNOSIS — I10 Essential (primary) hypertension: Secondary | ICD-10-CM

## 2018-08-24 MED ORDER — CHLORTHALIDONE 25 MG PO TABS: 12.5000 mg | ORAL_TABLET | Freq: Every day | ORAL | 1 refills | Status: DC

## 2018-08-24 MED ORDER — POTASSIUM CHLORIDE 20 MEQ PO PACK: 20.0000 meq | PACK | Freq: Every day | ORAL | 1 refills | Status: DC

## 2018-08-24 NOTE — Progress Notes (Signed)
3/6/202011:22 AM  [name] [name] Apr 20, 1956, 63 y.o. female [id number]  Chief Complaint  Patient presents with  . Hypertension    HPI:   Patient is a 63 y.o. female with past medical history significant for HTN, HLP, postlaminectomy syndrome opiate dependent who presents today for routine followup  Last OV Nov with Dr Nolon Rod, her PCP Changed amlodipine to chlorthalidone and KCl due to side effects  Takes chlorthalidone and her kcl as prescribed Took this morning Checking at home, usually 130/80s It will spike into the 150s is she is a lot of pain She has been trying lose weight Her clothing is fitting loser   Lab Results  Component Value Date   CREATININE 0.95 05/08/2018   BUN 11 05/08/2018   NA 139 05/08/2018   K 4.5 05/08/2018   CL 105 05/08/2018   CO2 21 05/08/2018   Lab Results  Component Value Date   CHOL 158 04/03/2017   HDL 42 04/03/2017   LDLCALC 93 04/03/2017   TRIG 116 04/03/2017   CHOLHDL 3.8 04/03/2017    Fall Risk  08/24/2018 07/30/2018 07/02/2018 06/08/2018 05/28/2018  Falls in the past year? 0 0 0 0 0  Risk for fall due to : - - - - -     Depression screen [facility] 2/9 08/24/2018 05/08/2018 03/07/2018  Decreased Interest 0 0 0  Down, Depressed, Hopeless 0 0 0  PHQ - 2 Score 0 0 0  Altered sleeping 0 - -  Tired, decreased energy 0 - -  Change in appetite 0 - -  Feeling bad or failure about yourself  0 - -  Trouble concentrating 0 - -  Moving slowly or fidgety/restless 0 - -  Suicidal thoughts 0 - -  PHQ-9 Score 0 - -  Difficult doing work/chores Not difficult at all - -  Some recent data might be hidden    Allergies  Allergen Reactions  . Orudis [Ketoprofen] Hives, Itching and Rash    onSet: 07/30/1990    Prior to Admission medications   Medication Sig Start Date End Date Taking? Authorizing Provider  chlorthalidone (HYGROTON) 25 MG tablet Take 0.5 tablets (12.5 mg total) by mouth daily. 05/08/18  Yes Stallings, Zoe A, MD  diclofenac  sodium (VOLTAREN) 1 % GEL Apply 2 g topically 4 (four) times daily. 02/09/18  Yes Bayard Hugger, NP  fluticasone (FLONASE) 50 MCG/ACT nasal spray Place 2 sprays into both nostrils daily. 03/07/18  Yes McVey, Gelene Mink, PA-C  morphine (MSIR) 15 MG tablet Take 1 tablet (15 mg total) by mouth 2 (two) times daily as needed for severe pain. 07/30/18  Yes Bayard Hugger, NP  Morphine Sulfate ER (MORPHABOND ER) 15 MG T12A Take 15 mg by mouth 3 (three) times daily. 07/30/18  Yes Bayard Hugger, NP  naloxone [facility]) nasal spray 4 mg/0.1 mL Lethargic or respiratory depression after opioid use 11/21/17  Yes Kirsteins, Luanna Salk, MD  potassium chloride (KLOR-CON) 20 MEQ packet Take 20 mEq by mouth daily. 05/08/18  Yes Forrest Moron, MD  tiZANidine (ZANAFLEX) 2 MG tablet Take 1 tablet (2 mg total) by mouth 2 (two) times daily as needed for muscle spasms. 04/04/18  Yes Bayard Hugger, NP  topiramate (TOPAMAX) 25 MG tablet TAKE 1 TABLET BY MOUTH EVERYDAY AT BEDTIME 08/20/18  Yes Kirsteins, Luanna Salk, MD    Past Medical History:  Diagnosis Date  . Blood transfusion without reported diagnosis   . Crohn's disease ([place])   . Disorder  of sacroiliac joint   . Hyperlipidemia   . Hypertension   . Ischemic colitis ([place])   . Lumbago   . Lumbar post-laminectomy syndrome   . Lumbosacral neuritis   . Lumbosacral radiculitis   . Sciatica     Past Surgical History:  Procedure Laterality Date  . ABDOMINAL HYSTERECTOMY     DUB; ovaries intact  . BREAST BIOPSY    . CARDIAC CATHETERIZATION  1990's   [name]  . COLON SURGERY  05/11/11   [facility] Lysis of adhesions Crohn's  . MASS EXCISION Left 01/03/2018   Procedure: EXCISION LEFT UPPER BACK LIPOMA ERAS PATH;  Surgeon: Erroll Luna, MD;  Location: [place];  Service: General;  Laterality: Left;  . REDUCTION MAMMAPLASTY    . SPINE SURGERY      Social History   Tobacco Use  . Smoking status: Former Smoker    Packs/day: 0.50     Years: 20.00    Pack years: 10.00    Types: Cigarettes    Last attempt to quit: 07/21/2008    Years since quitting: 10.0  . Smokeless tobacco: Never Used  Substance Use Topics  . Alcohol use: No    Family History  Problem Relation Age of Onset  . Heart disease Mother 75       CHF; CAD  . Hypertension Mother   . Heart failure Mother   . Diabetes Sister   . Lupus Sister   . Kidney failure Sister   . Hypertension Brother   . Hyperlipidemia Sister   . Hypertension Sister   . Colon cancer Neg Hx   . Rectal cancer Neg Hx     ROS Per hpi  OBJECTIVE:  Blood pressure 130/82, pulse 77, temperature 99 F (37.2 C), temperature source Oral, resp. rate 17, height _0  (1.626 m), weight 218 lb (98.9 kg), SpO2 98 %. Body mass index is 37.42 kg/m.   Physical Exam Vitals signs and nursing note reviewed.  Constitutional:      Appearance: She is well-developed.  HENT:     Head: Normocephalic and atraumatic.  Eyes:     General: No scleral icterus.    Conjunctiva/sclera: Conjunctivae normal.     Pupils: Pupils are equal, round, and reactive to light.  Neck:     Musculoskeletal: Neck supple.  Pulmonary:     Effort: Pulmonary effort is normal.  Skin:    General: Skin is warm and dry.  Neurological:     Mental Status: She is alert and oriented to person, place, and time.     ASSESSMENT and PLAN  1. Essential hypertension Controlled. Continue current regime.  - Lipid panel - TSH - CMP14+EGFR    3 months with PCP, Dr Nolon Rod    Rutherford Guys, MD [facility] [place], [place] [address] Ph.  [phone] Fax [phone]

## 2018-08-24 NOTE — Patient Instructions (Signed)
° ° ° °  If you have lab work done today you will be contacted with your lab results within the next 2 weeks.  If you have not heard from us then please contact us. The fastest way to get your results is to register for My Chart. ° ° °IF you received an x-ray today, you will receive an invoice from [facility]. Please contact [facility] at [phone] with questions or concerns regarding your invoice.  ° °IF you received labwork today, you will receive an invoice from [facility]. Please contact [facility] at [phone] with questions or concerns regarding your invoice.  ° °Our billing staff will not be able to assist you with questions regarding bills from these companies. ° °You will be contacted with the lab results as soon as they are available. The fastest way to get your results is to activate your My Chart account. Instructions are located on the last page of this paperwork. If you have not heard from us regarding the results in 2 weeks, please contact this office. °  ° ° ° °

## 2018-08-24 NOTE — Telephone Encounter (Signed)
Copied from [place] [id number]. Topic: Quick Communication - Rx Refill/Question >> Aug 24, 2018  2:29 PM Ewa Gentry, [place] D wrote: Medication: promethazine (PHENERGAN) 25 MG tablet / Pt stated she had an OV today with Dr. Pamella Pert and her promethazine was not refilled. Please advise. Not on current medication list.  Has the patient contacted their pharmacy? Yes.   (Agent: If no, request that the patient contact the pharmacy for the refill.) (Agent: If yes, when and what did the pharmacy advise?)  Preferred Pharmacy (with phone number or street name): [id number] [address], [place] [phone] (Phone) [phone] (Fax)  Agent: Please be advised that RX refills may take up to 3 business days. We ask that you follow-up with your pharmacy.

## 2018-08-24 NOTE — Telephone Encounter (Signed)
Requesting Phenergan 25 mg tablet.    She forgot to mention it in her appt today with Dr. Pamella Pert.  See attached message

## 2018-08-25 LAB — CMP14+EGFR
ALT: 18 IU/L (ref 0–32)
AST: 21 IU/L (ref 0–40)
Albumin/Globulin Ratio: 1.8 (ref 1.2–2.2)
Albumin: 4.6 g/dL (ref 3.8–4.8)
Alkaline Phosphatase: 87 IU/L (ref 39–117)
BUN/Creatinine Ratio: 13 (ref 12–28)
BUN: 14 mg/dL (ref 8–27)
Bilirubin Total: 1 mg/dL (ref 0.0–1.2)
CO2: 25 mmol/L (ref 20–29)
Calcium: 9.8 mg/dL (ref 8.7–10.3)
Chloride: 99 mmol/L (ref 96–106)
Creatinine, Ser: 1.04 mg/dL — ABNORMAL HIGH (ref 0.57–1.00)
GFR calc Af Amer: 67 mL/min/{1.73_m2} (ref >59)
GFR calc non Af Amer: 58 mL/min/{1.73_m2} — ABNORMAL LOW (ref >59)
Globulin, Total: 2.5 g/dL (ref 1.5–4.5)
Glucose: 120 mg/dL — ABNORMAL HIGH (ref 65–99)
Potassium: 4.3 mmol/L (ref 3.5–5.2)
Sodium: 139 mmol/L (ref 134–144)
Total Protein: 7.1 g/dL (ref 6.0–8.5)

## 2018-08-25 LAB — LIPID PANEL
Chol/HDL Ratio: 4 ratio (ref 0.0–4.4)
Cholesterol, Total: 190 mg/dL (ref 100–199)
HDL: 48 mg/dL (ref >39)
LDL Calculated: 118 mg/dL — ABNORMAL HIGH (ref 0–99)
Triglycerides: 118 mg/dL (ref 0–149)
VLDL Cholesterol Cal: 24 mg/dL (ref 5–40)

## 2018-08-25 LAB — TSH: TSH: 2.64 u[IU]/mL (ref 0.450–4.500)

## 2018-08-28 ENCOUNTER — Encounter: Payer: Medicare Other | Attending: Physical Medicine & Rehabilitation | Admitting: Registered Nurse

## 2018-08-28 ENCOUNTER — Encounter: Payer: Self-pay | Admitting: Registered Nurse

## 2018-08-28 VITALS — BP 148/70 | HR 60 | Ht 64.0 in | Wt 216.0 lb

## 2018-08-28 DIAGNOSIS — M7501 Adhesive capsulitis of right shoulder: Secondary | ICD-10-CM | POA: Diagnosis not present

## 2018-08-28 DIAGNOSIS — M961 Postlaminectomy syndrome, not elsewhere classified: Secondary | ICD-10-CM

## 2018-08-28 DIAGNOSIS — G894 Chronic pain syndrome: Secondary | ICD-10-CM

## 2018-08-28 DIAGNOSIS — M5414 Radiculopathy, thoracic region: Secondary | ICD-10-CM | POA: Insufficient documentation

## 2018-08-28 DIAGNOSIS — Z79891 Long term (current) use of opiate analgesic: Secondary | ICD-10-CM | POA: Diagnosis not present

## 2018-08-28 DIAGNOSIS — M5417 Radiculopathy, lumbosacral region: Secondary | ICD-10-CM | POA: Insufficient documentation

## 2018-08-28 DIAGNOSIS — M62838 Other muscle spasm: Secondary | ICD-10-CM

## 2018-08-28 DIAGNOSIS — M48061 Spinal stenosis, lumbar region without neurogenic claudication: Secondary | ICD-10-CM | POA: Diagnosis not present

## 2018-08-28 DIAGNOSIS — Z5181 Encounter for therapeutic drug level monitoring: Secondary | ICD-10-CM

## 2018-08-28 MED ORDER — MORPHINE SULFATE 15 MG PO TABS: 15.0000 mg | ORAL_TABLET | Freq: Two times a day (BID) | ORAL | 0 refills | Status: DC | PRN

## 2018-08-28 MED ORDER — MORPHINE SULFATE ER 15 MG PO T12A: 15.0000 mg | EXTENDED_RELEASE_TABLET | Freq: Three times a day (TID) | ORAL | 0 refills | Status: DC

## 2018-08-28 NOTE — Progress Notes (Signed)
Subjective:    Patient ID: [name] [name], female    DOB: [birth year], 63 y.o.   MRN: [id number]  HPI: [name] [name] is a 63 y.o. female who returns for follow up appointment for chronic pain and medication refill. She states her pain is located in her lower back pain. She rates her pain 8. Her current exercise regime is walking and performing stretching exercises.  [name] Morphine equivalent is 75.00  MME.  Last Oral Swab was Performed on 12/14/2017, it was consistent.   Pain Inventory Average Pain 8 Pain Right Now 8 My pain is intermittent, stabbing and aching  In the last 24 hours, has pain interfered with the following? General activity 8 Relation with others 9 Enjoyment of life 8 What TIME of day is your pain at its worst? morning Sleep (in general) Fair  Pain is worse with: walking, sitting and standing Pain improves with: rest, heat/ice and medication Relief from Meds: 1  Mobility walk without assistance ability to climb steps?  yes do you drive?  yes  Function disabled: date disabled 2009  Neuro/Psych No problems in this area  Prior Studies Any changes since last visit?  no  Physicians involved in your care Any changes since last visit?  no   Family History  Problem Relation Age of Onset  . Heart disease Mother 31       CHF; CAD  . Hypertension Mother   . Heart failure Mother   . Diabetes Sister   . Lupus Sister   . Kidney failure Sister   . Hypertension Brother   . Hyperlipidemia Sister   . Hypertension Sister   . Colon cancer Neg Hx   . Rectal cancer Neg Hx    Social History   Socioeconomic History  . Marital status: Widowed    Spouse name: Not on file  . Number of children: 3  . Years of education: Not on file  . Highest education level: Not on file  Occupational History  . 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 Financial resource strain: Not on file  . Food insecurity:    Worry: Not on  file    Inability: Not on file  . Transportation needs:    Medical: Not on file    Non-medical: Not on file  Tobacco Use  . Smoking status: Former Smoker    Packs/day: 0.50    Years: 20.00    Pack years: 10.00    Types: Cigarettes    Last attempt to quit: 07/21/2008    Years since quitting: 10.1  . Smokeless tobacco: Never Used  Substance and Sexual Activity  . Alcohol use: No  . Drug use: No  . Sexual activity: Not on file  Lifestyle  . Physical activity:    Days per week: Not on file    Minutes per session: Not on file  . Stress: Not on file  Relationships  . Social connections:    Talks on phone: Not on file    Gets together: Not on file    Attends religious service: Not on file    Active member of club or organization: Not on file    Attends meetings of clubs or organizations: Not on file    Relationship status: Not on file  Other Topics Concern  . Not on file  Social History Narrative   Marital status: widowed since 2002; not dating which is bad in 2018  Children: 3 children (45, 27, 40); 13 grandchildren; 1 gg      Employment: disability since 2009; retail      Lives: alone in [place] in apartment      Tobacco: quit in 2011      Alcohol: none      Drugs: none      Exercise: minimal in 2018      ADLs: independent with ADLs; drives      Seatbelt: 100%; no texting.   Past Surgical History:  Procedure Laterality Date  . ABDOMINAL HYSTERECTOMY     DUB; ovaries intact  . BREAST BIOPSY    . CARDIAC CATHETERIZATION  1990's   [name] [name]  . COLON SURGERY  05/11/11   [name] [name] of adhesions Crohn's  . MASS EXCISION Left 01/03/2018   Procedure: EXCISION LEFT UPPER BACK LIPOMA ERAS PATH;  Surgeon: [name] Luna, MD;  Location: [facility];  Service: General;  Laterality: Left;  . REDUCTION MAMMAPLASTY    . SPINE SURGERY     Past Medical History:  Diagnosis Date  . Blood transfusion without reported diagnosis   . Crohn's disease ([place])   .  Disorder of sacroiliac joint   . Hyperlipidemia   . Hypertension   . Ischemic colitis ([place])   . Lumbago   . Lumbar post-laminectomy syndrome   . Lumbosacral neuritis   . Lumbosacral radiculitis   . Sciatica    BP (!) 148/70   Pulse 60   Ht 5\' 4"  (1.626 m)   Wt 216 lb (98 kg)   SpO2 96%   BMI 37.08 kg/m   Opioid Risk Score:   Fall Risk Score:  `1  Depression screen PHQ 2/9  Depression screen [facility] 2/9 08/24/2018 05/08/2018 03/07/2018 10/18/2017 09/18/2017 07/19/2017 04/03/2017  Decreased Interest 0 0 0 0 0 0 0  Down, Depressed, Hopeless 0 0 0 0 0 3 0  PHQ - 2 Score 0 0 0 0 0 3 0  Altered sleeping 0 - - - 0 - -  Tired, decreased energy 0 - - - 0 - -  Change in appetite 0 - - - 0 - -  Feeling bad or failure about yourself  0 - - - 0 - -  Trouble concentrating 0 - - - 0 - -  Moving slowly or fidgety/restless 0 - - - 0 - -  Suicidal thoughts 0 - - - 0 - -  PHQ-9 Score 0 - - - 0 - -  Difficult doing work/chores Not difficult at all - - - - - -  Some recent data might be hidden     Review of Systems  Constitutional: Negative.   HENT: Negative.   Eyes: Negative.   Respiratory: Negative.   Cardiovascular: Negative.   Gastrointestinal: Negative.   Endocrine: Negative.   Genitourinary: Negative.   Musculoskeletal: Positive for arthralgias, back pain and myalgias.  Skin: Negative.   Allergic/Immunologic: Negative.   Neurological: Negative.   Hematological: Negative.   Psychiatric/Behavioral: Negative.   All other systems reviewed and are negative.      Objective:   Physical Exam Vitals signs and nursing note reviewed.  Constitutional:      Appearance: Normal appearance.  Neck:     Musculoskeletal: Normal range of motion and neck supple.  Cardiovascular:     Rate and Rhythm: Normal rate and regular rhythm.     Pulses: Normal pulses.     Heart sounds: Normal heart sounds.  Pulmonary:  Effort: Pulmonary effort is normal.     Breath sounds: Normal breath sounds.    Musculoskeletal:     Comments: Normal Muscle Bulk and Muscle Testing Reveals:  Upper Extremities: ROM and Muscle Strength   Lumbar Paraspinal Tenderness: L-4-L-5  Lower Extremities: Full ROM and Muscle Strength 5/5 Arises from Table with Ease  Narrow Based Gait   Skin:    General: Skin is warm and dry.  Neurological:     Mental Status: She is alert and oriented to person, place, and time.  Psychiatric:        Mood and Affect: Mood normal.        Behavior: Behavior normal.           Assessment & Plan:  1.Lumbar Postlaminectomy/ Spinal Stenosis Lumbar Region/ Post-Op Pain/Low back pain with radiating symptoms in L3-4 distribution: continues to be limited by pain. Refilled:MSIR 15 mg one tablet twice a day as needed for moderate to sever pain#60andMorphabond 15 mg Q 8 hours.08/28/2018. We will continue the opioid monitoring program, this consists of regular clinic visits, examinations, urine drug screen, pill counts as well as use of [place] Controlled Substance Reporting System. Encouraged to Continue exercise regime.  2. Lumbar Radiculopathy: No complaints today.Continue to Monitor03/03/2019 3. Chronic Right Knee Pain:No complaints today. Continue with Ice TherapyandVoltaren Gel. 08/28/2018 4. Muscle Spasm: Continuecurrent treatment: Tizanidine.03//03/2019 5. Lipoma: S/P Left Lipoma Excision of upper back By Dr. Brantley Stage on 01/03/2018. Surgery Following. Continue to Monitor. 07/30/2017. 6. Paresthesia of Skin: ContinueTopamax. 07/30/2018 7. Adhesive Capsulitis of Right Shoulder: S/P U/S Cortisone injection with some relief noted. . Continue to monitor. Continue HEP as Tolerated.08/28/2018  20 minutes of face to face patient care time was spent during this visit. All questions were encouraged and answered.  F/U in 1 month

## 2018-09-04 LAB — DRUG TOX MONITOR 1 W/CONF, ORAL FLD
Amphetamines: NEGATIVE ng/mL (ref <10)
Barbiturates: NEGATIVE ng/mL (ref <10)
Benzodiazepines: NEGATIVE ng/mL (ref <0.50)
Buprenorphine: NEGATIVE ng/mL (ref <0.10)
CODEINE: NEGATIVE ng/mL (ref <2.5)
Cocaine: NEGATIVE ng/mL (ref <5.0)
Cotinine: 120.6 ng/mL — ABNORMAL HIGH (ref <5.0)
DIHYDROCODEINE: NEGATIVE ng/mL (ref <2.5)
Fentanyl: NEGATIVE ng/mL (ref <0.10)
Heroin Metabolite: NEGATIVE ng/mL (ref <1.0)
Hydrocodone: NEGATIVE ng/mL (ref <2.5)
Hydromorphone: NEGATIVE ng/mL (ref <2.5)
MARIJUANA: NEGATIVE ng/mL (ref <2.5)
MDMA: NEGATIVE ng/mL (ref <10)
Meprobamate: NEGATIVE ng/mL (ref <2.5)
Methadone: NEGATIVE ng/mL (ref <5.0)
Morphine: 14.9 ng/mL — ABNORMAL HIGH (ref <2.5)
Nicotine Metabolite: POSITIVE ng/mL — AB (ref <5.0)
Norhydrocodone: NEGATIVE ng/mL (ref <2.5)
Noroxycodone: NEGATIVE ng/mL (ref <2.5)
OXYCODONE: NEGATIVE ng/mL (ref <2.5)
Opiates: POSITIVE ng/mL — AB (ref <2.5)
Oxymorphone: NEGATIVE ng/mL (ref <2.5)
Phencyclidine: NEGATIVE ng/mL (ref <10)
TRAMADOL: NEGATIVE ng/mL (ref <5.0)
Tapentadol: NEGATIVE ng/mL (ref <5.0)
Zolpidem: NEGATIVE ng/mL (ref <5.0)

## 2018-09-04 LAB — DRUG TOX ALC METAB W/CON, ORAL FLD: Alcohol Metabolite: NEGATIVE ng/mL (ref <25)

## 2018-09-05 ENCOUNTER — Telehealth: Payer: Self-pay | Admitting: *Deleted

## 2018-09-05 NOTE — Telephone Encounter (Signed)
Oral swab drug screen was consistent for prescribed medications.  ?

## 2018-09-15 IMAGING — MR MR CHEST MEDIASTINUM WO/W CM
10 series · 16 of 16 positions shown · IV contrast (20 ml multihance)
Comparison: Cervical spine CT 04/19/2012.

CLINICAL DATA: Enlarging mass in the posterior left upper chest
since 5002. Increasing size and pain over the last 8 months. No
acute injury or history of malignancy.

Creatinine was obtained on site at [HOSPITAL] at [HOSPITAL].
Results: Creatinine 0.9 mg/dL.
EXAM:
MRI CHEST WITHOUT AND WITH CONTRAST
TECHNIQUE: Multiplanar imaging of the upper left chest was performed with
attention to the area concern with pre and post-contrast imaging.
Capsules were placed over the palpable concern in the posterior
periscapular region.
CONTRAST:  20mL MULTIHANCE GADOBENATE DIMEGLUMINE 529 MG/ML IV SOLN

[Series 19: T2 · sagittal · 5.0mm · 0.67mm/px · 2 of 28 slices shown]
[im 1/28]
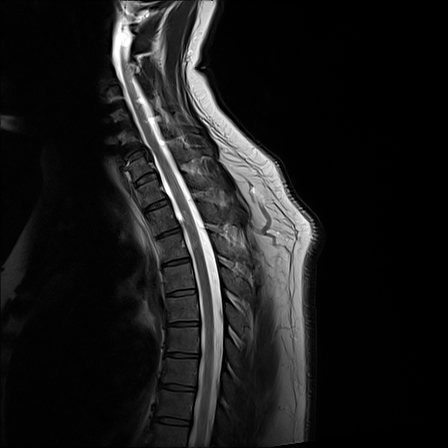
[im 28/28]
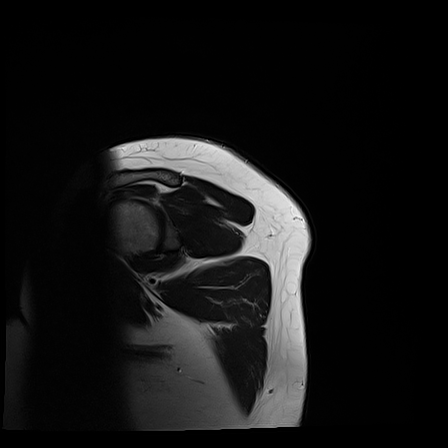

[Series 20: STIR · sagittal · 5.0mm · 0.67mm/px · 2 of 28 slices shown]
[im 1/28]
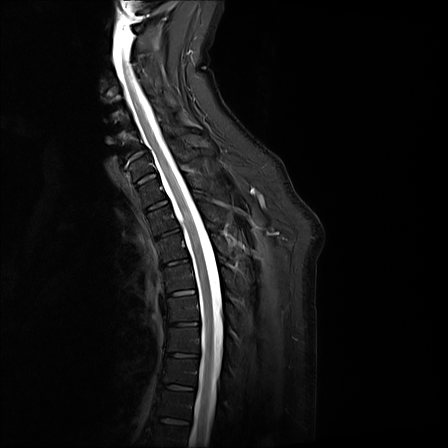
[im 28/28]
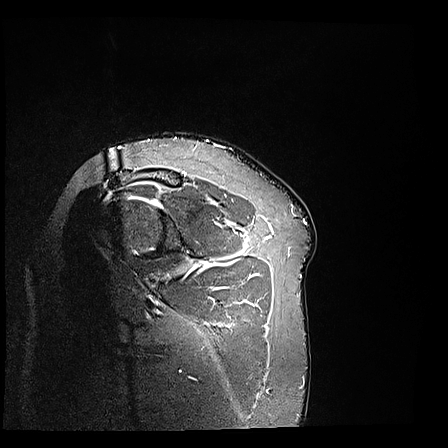

[Series 21: T1 · coronal · 3.0mm · 0.75mm/px · 2 of 24 slices shown (1 of 2)]
[im 1/24]
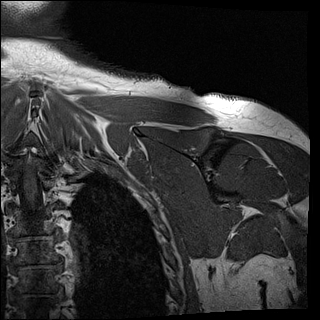
[im 24/24]
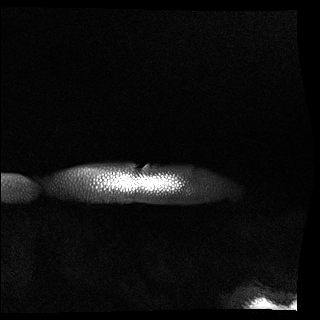

[Series 22: T2 fat-sat · axial · 4.0mm · 0.38mm/px · 1 of 20 slices shown (1 of 2)]
[im 1/20]
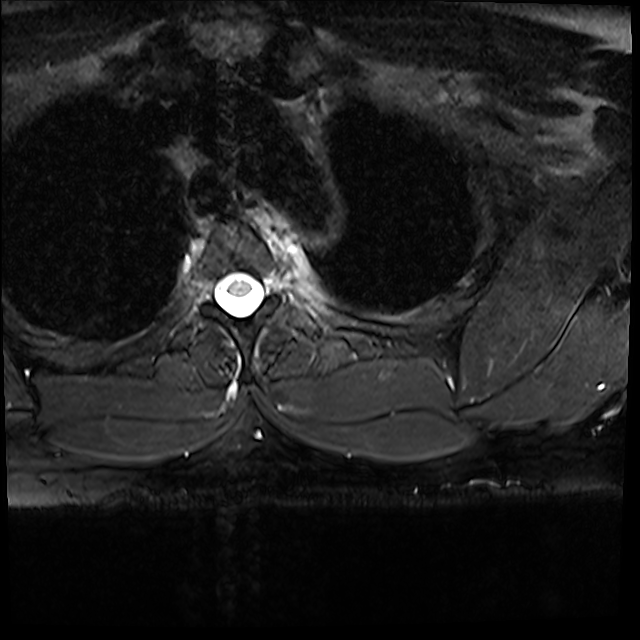

[Series 23: T1 · axial · non-contrast · 4.0mm · 0.75mm/px · 1 of 20 slices shown (2 of 2)]
[im 1/20]
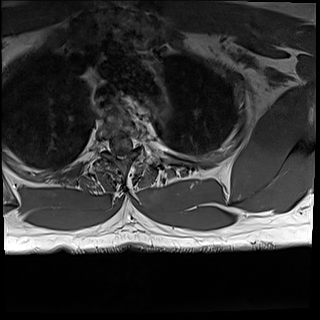

[Series 24: T2 fat-sat · coronal · 3.0mm · 0.75mm/px · 2 of 24 slices shown (2 of 2)]
[im 1/24]
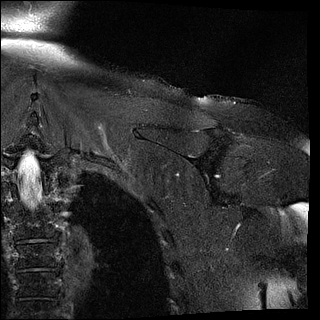
[im 24/24]
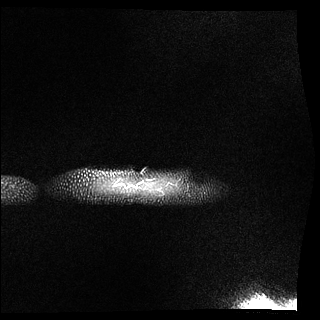

[Series 25: T1 fat-sat · axial · non-contrast · 4.0mm · 0.75mm/px · 1 of 20 slices shown (1 of 3)]
[im 1/20]
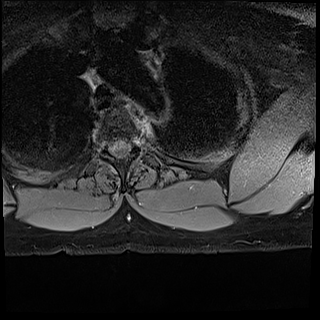

[Series 26: T1 fat-sat post-contrast · axial · 4.0mm · 0.75mm/px · 1 of 20 slices shown]
[im 1/20]
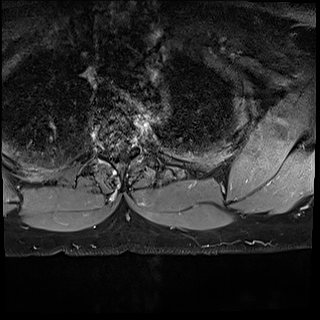

[Series 27: T1 fat-sat · sagittal · 5.0mm · 0.67mm/px · 2 of 28 slices shown (2 of 3)]
[im 1/28]
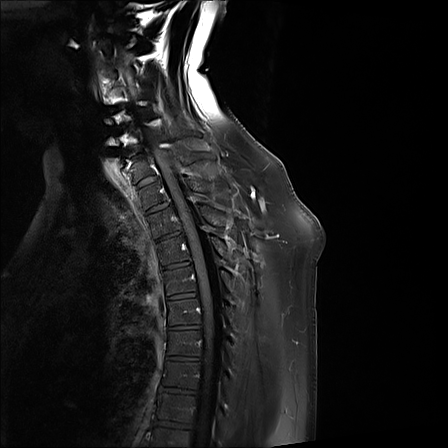
[im 28/28]
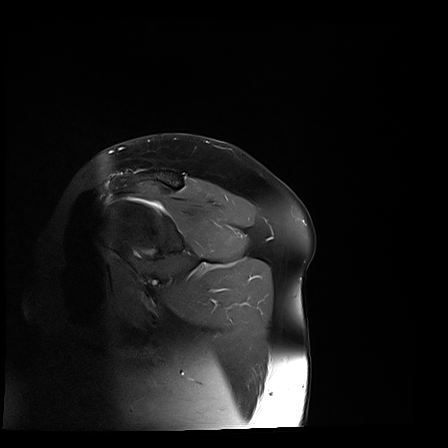

[Series 28: T1 fat-sat · coronal · 3.0mm · 0.75mm/px · 2 of 24 slices shown (3 of 3)]
[im 1/24]
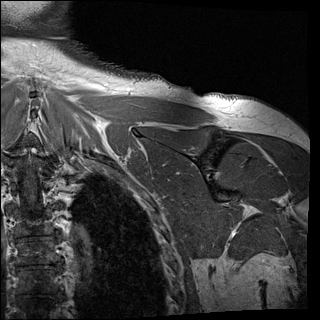
[im 24/24]
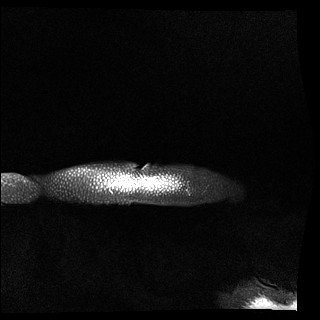

[16 of 16 positions shown; findings below may reference images not displayed]

FINDINGS: Examination is targeted to the posterior aspect of the left upper
chest wall. At the level of concern, underlying the capsules, there
is an ill-defined fatty mass in the subcutaneous fat superficial to
the trapezius muscle. This measures approximately 9.3 x 4.1 x
cm. There are few thin septations associated with this fatty lesion,
but no T2 hyperintensity or abnormal enhancement following contrast.
There is no involvement of the underlying musculature. Appearance is
consistent with a simple lipoma.

No other chest wall masses are identified. Patient is status post
C4-6 ACDF. There is a central disc protrusion at C6-7 without
resulting cord deformity. The visualized lower neck and mediastinum
appear unremarkable.
IMPRESSION: 1. The patient's palpable concern corresponds with a superficial
fatty mass consistent with a subcutaneous lipoma. This demonstrates
no aggressive characteristics. Clinical follow up of any palpable
concern recommended.
2. Central disc protrusion at C6-7 post C4-6 ACDF.

## 2018-09-24 ENCOUNTER — Other Ambulatory Visit: Payer: Self-pay | Admitting: Registered Nurse

## 2018-09-27 ENCOUNTER — Other Ambulatory Visit: Payer: Self-pay

## 2018-09-27 ENCOUNTER — Encounter: Payer: Self-pay | Admitting: Registered Nurse

## 2018-09-27 ENCOUNTER — Encounter: Payer: Medicare Other | Attending: Physical Medicine & Rehabilitation | Admitting: Registered Nurse

## 2018-09-27 VITALS — BP 147/80 | HR 60 | Ht 64.0 in | Wt 214.0 lb

## 2018-09-27 DIAGNOSIS — M961 Postlaminectomy syndrome, not elsewhere classified: Secondary | ICD-10-CM | POA: Diagnosis not present

## 2018-09-27 DIAGNOSIS — M5414 Radiculopathy, thoracic region: Secondary | ICD-10-CM | POA: Diagnosis not present

## 2018-09-27 DIAGNOSIS — G894 Chronic pain syndrome: Secondary | ICD-10-CM

## 2018-09-27 DIAGNOSIS — Z79891 Long term (current) use of opiate analgesic: Secondary | ICD-10-CM

## 2018-09-27 DIAGNOSIS — Z5181 Encounter for therapeutic drug level monitoring: Secondary | ICD-10-CM

## 2018-09-27 DIAGNOSIS — M5417 Radiculopathy, lumbosacral region: Secondary | ICD-10-CM | POA: Diagnosis not present

## 2018-09-27 DIAGNOSIS — M7501 Adhesive capsulitis of right shoulder: Secondary | ICD-10-CM | POA: Diagnosis not present

## 2018-09-27 DIAGNOSIS — M62838 Other muscle spasm: Secondary | ICD-10-CM

## 2018-09-27 DIAGNOSIS — M48061 Spinal stenosis, lumbar region without neurogenic claudication: Secondary | ICD-10-CM

## 2018-09-27 MED ORDER — MORPHINE SULFATE ER 15 MG PO T12A: 15.0000 mg | EXTENDED_RELEASE_TABLET | Freq: Three times a day (TID) | ORAL | 0 refills | Status: DC

## 2018-09-27 MED ORDER — MORPHINE SULFATE 15 MG PO TABS: 15.0000 mg | ORAL_TABLET | Freq: Two times a day (BID) | ORAL | 0 refills | Status: DC | PRN

## 2018-09-27 NOTE — Progress Notes (Signed)
Subjective:    Patient ID: [name] [name], female    DOB: 1956-04-09, 63 y.o.   MRN: [id number]  HPI: [name] [name] is a 63 y.o. female who returns for follow up appointment for chronic pain and medication refill. She states her pain is located in her lower back. She rates her pain 6. Her current exercise regime is walking and performing stretching exercises.  Ms. Cornia Morphine equivalent is 75.00MME.  Last Oral Swab was Perform on 08/28/2018, it was consistent   Pain Inventory Average Pain 8 Pain Right Now 8 My pain is constant, sharp, stabbing and aching  In the last 24 hours, has pain interfered with the following? General activity 8 Relation with others 9 Enjoyment of life 8 What TIME of day is your pain at its worst? morning, daytime,night Sleep (in general) Fair  Pain is worse with: walking, sitting and standing Pain improves with: rest, heat/ice and medication Relief from Meds: 1  Mobility walk without assistance how many minutes can you walk? 5 ability to climb steps?  yes do you drive?  yes  Function not employed: date last employed 08/2006 disabled: date disabled 10/2007 I need assistance with the following:  household duties  Neuro/Psych spasms  Prior Studies Any changes since last visit?  no  Physicians involved in your care Any changes since last visit?  no   Family History  Problem Relation Age of Onset  . Heart disease Mother 22       CHF; CAD  . Hypertension Mother   . Heart failure Mother   . Diabetes Sister   . Lupus Sister   . Kidney failure Sister   . Hypertension Brother   . Hyperlipidemia Sister   . Hypertension Sister   . Colon cancer Neg Hx   . Rectal cancer Neg Hx    Social History   Socioeconomic History  . Marital status: Widowed    Spouse name: Not on file  . Number of children: 3  . Years of education: Not on file  . Highest education level: Not on file  Occupational History  . 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 Financial resource strain: Not on file  . Food insecurity:    Worry: Not on file    Inability: Not on file  . Transportation needs:    Medical: Not on file    Non-medical: Not on file  Tobacco Use  . Smoking status: Former Smoker    Packs/day: 0.50    Years: 20.00    Pack years: 10.00    Types: Cigarettes    Last attempt to quit: 07/21/2008    Years since quitting: 10.1  . Smokeless tobacco: Never Used  Substance and Sexual Activity  . Alcohol use: No  . Drug use: No  . Sexual activity: Not on file  Lifestyle  . Physical activity:    Days per week: Not on file    Minutes per session: Not on file  . Stress: Not on file  Relationships  . Social connections:    Talks on phone: Not on file    Gets together: Not on file    Attends religious service: Not on file    Active member of club or organization: Not on file    Attends meetings of clubs or organizations: Not on file    Relationship status: Not on file  Other Topics Concern  . Not on file  Social History Narrative  Marital status: widowed since 2002; not dating which is bad in 2018      Children: 3 children (45, 21, 15); 13 grandchildren; 1 gg      Employment: disability since 2009; retail      Lives: alone in [place] in apartment      Tobacco: quit in 2011      Alcohol: none      Drugs: none      Exercise: minimal in 2018      ADLs: independent with ADLs; drives      Seatbelt: 100%; no texting.   Past Surgical History:  Procedure Laterality Date  . ABDOMINAL HYSTERECTOMY     DUB; ovaries intact  . BREAST BIOPSY    . CARDIAC CATHETERIZATION  1990's   [name]  . COLON SURGERY  05/11/11   [facility] of adhesions Crohn's  . MASS EXCISION Left 01/03/2018   Procedure: EXCISION LEFT UPPER BACK LIPOMA ERAS PATH;  Surgeon: Erroll Luna, MD;  Location: [facility];  Service: General;  Laterality: Left;  . REDUCTION MAMMAPLASTY    . SPINE SURGERY      Past Medical History:  Diagnosis Date  . Blood transfusion without reported diagnosis   . Crohn's disease ([place])   . Disorder of sacroiliac joint   . Hyperlipidemia   . Hypertension   . Ischemic colitis ([place])   . Lumbago   . Lumbar post-laminectomy syndrome   . Lumbosacral neuritis   . Lumbosacral radiculitis   . Sciatica    BP (!) 147/80   Pulse (!) 56   Ht 5\' 4"  (1.626 m)   Wt 214 lb (97.1 kg)   SpO2 95%   BMI 36.73 kg/m   Opioid Risk Score:   Fall Risk Score:  `1  Depression screen PHQ 2/9  Depression screen [facility] 2/9 09/27/2018 08/24/2018 05/08/2018 03/07/2018 10/18/2017 09/18/2017 07/19/2017  Decreased Interest 0 0 0 0 0 0 0  Down, Depressed, Hopeless 0 0 0 0 0 0 3  PHQ - 2 Score 0 0 0 0 0 0 3  Altered sleeping - 0 - - - 0 -  Tired, decreased energy - 0 - - - 0 -  Change in appetite - 0 - - - 0 -  Feeling bad or failure about yourself  - 0 - - - 0 -  Trouble concentrating - 0 - - - 0 -  Moving slowly or fidgety/restless - 0 - - - 0 -  Suicidal thoughts - 0 - - - 0 -  PHQ-9 Score - 0 - - - 0 -  Difficult doing work/chores - Not difficult at all - - - - -  Some recent data might be hidden     Review of Systems  Constitutional: Negative.   HENT: Negative.   Eyes: Negative.   Respiratory: Negative.   Cardiovascular: Negative.   Gastrointestinal: Negative.   Endocrine: Negative.   Genitourinary: Negative.   Musculoskeletal: Negative.   Skin: Negative.   Allergic/Immunologic: Negative.   Neurological: Negative.   Hematological: Negative.   Psychiatric/Behavioral: Negative.   All other systems reviewed and are negative.      Objective:   Physical Exam Vitals signs and nursing note reviewed.  Constitutional:      Appearance: Normal appearance.  Neck:     Musculoskeletal: Normal range of motion and neck supple.  Cardiovascular:     Rate and Rhythm: Normal rate and regular rhythm.     Pulses: Normal pulses.  Heart sounds: Normal heart sounds.  Pulmonary:      Effort: Pulmonary effort is normal.     Breath sounds: Normal breath sounds.  Musculoskeletal:     Comments: Normal Muscle Bulk and Muscle Testing Reveals:  Upper Extremities: Right: Decrease ROM 45 Degrees and Muscle Strength 5/5 Left: Full ROM and Muscle Strength 5/5 Lumbar Paraspinal Tenderness: L-4-L-5 Lower Extremities: Full ROM and Muscle Strength 5/5 Arises from table with ease Narrow Based Gait   Skin:    General: Skin is warm and dry.  Neurological:     Mental Status: She is alert and oriented to person, place, and time.  Psychiatric:        Mood and Affect: Mood normal.        Behavior: Behavior normal.           Assessment & Plan:  1.Lumbar Postlaminectomy/ Spinal Stenosis Lumbar Region/ Post-Op Pain/Low back pain with radiating symptoms in L3-4 distribution: continues to be limited by pain. Refilled:MSIR 15 mg one tablet twice a day as needed for moderate to sever pain#60andMorphabond 15 mg Q 8 hours.09/27/2018. We will continue the opioid monitoring program, this consists of regular clinic visits, examinations, urine drug screen, pill counts as well as use of [place] Controlled Substance Reporting System. Encouraged to Continue exercise regime.  2. Lumbar Radiculopathy: No complaints today.Continue to Monitor04/02/2019 3. Chronic Right Knee Pain:No complaints today. Continue with Ice TherapyandVoltaren Gel. 09/27/2018 4. Muscle Spasm: Continuecurrent treatment: Tizanidine.04//02/2019 5. Lipoma: S/P Left Lipoma Excision of upper back By Dr. Brantley Stage on 01/03/2018. Surgery Following. Continue to Monitor. 09/27/2018. 6. Paresthesia of Skin: ContinueTopamax.09/27/2018 7. Adhesive Capsulitis of Right Shoulder: S/PU/S Cortisoneinjection on 07/02/2018 with some relief noted.. Continue to monitor. Continue HEP as Tolerated.09/27/2018  20 minutes of face to face patient care time was spent during this visit. All questions were encouraged  and answered.  F/U in 1 month

## 2018-10-22 ENCOUNTER — Other Ambulatory Visit: Payer: Self-pay | Admitting: Registered Nurse

## 2018-10-24 ENCOUNTER — Encounter: Payer: Self-pay | Admitting: Registered Nurse

## 2018-10-24 ENCOUNTER — Other Ambulatory Visit: Payer: Self-pay

## 2018-10-24 ENCOUNTER — Encounter: Payer: Medicare Other | Attending: Physical Medicine & Rehabilitation | Admitting: Registered Nurse

## 2018-10-24 VITALS — BP 138/77 | HR 63 | Ht 64.0 in | Wt 214.0 lb

## 2018-10-24 DIAGNOSIS — G894 Chronic pain syndrome: Secondary | ICD-10-CM

## 2018-10-24 DIAGNOSIS — Z5181 Encounter for therapeutic drug level monitoring: Secondary | ICD-10-CM | POA: Diagnosis not present

## 2018-10-24 DIAGNOSIS — M5417 Radiculopathy, lumbosacral region: Secondary | ICD-10-CM | POA: Insufficient documentation

## 2018-10-24 DIAGNOSIS — M961 Postlaminectomy syndrome, not elsewhere classified: Secondary | ICD-10-CM | POA: Diagnosis not present

## 2018-10-24 DIAGNOSIS — M62838 Other muscle spasm: Secondary | ICD-10-CM

## 2018-10-24 DIAGNOSIS — M5416 Radiculopathy, lumbar region: Secondary | ICD-10-CM

## 2018-10-24 DIAGNOSIS — M48061 Spinal stenosis, lumbar region without neurogenic claudication: Secondary | ICD-10-CM

## 2018-10-24 DIAGNOSIS — M5414 Radiculopathy, thoracic region: Secondary | ICD-10-CM | POA: Insufficient documentation

## 2018-10-24 DIAGNOSIS — Z79891 Long term (current) use of opiate analgesic: Secondary | ICD-10-CM

## 2018-10-24 MED ORDER — MORPHINE SULFATE 15 MG PO TABS: 15.0000 mg | ORAL_TABLET | Freq: Two times a day (BID) | ORAL | 0 refills | Status: DC | PRN

## 2018-10-24 MED ORDER — MORPHINE SULFATE ER 15 MG PO T12A: 15.0000 mg | EXTENDED_RELEASE_TABLET | Freq: Three times a day (TID) | ORAL | 0 refills | Status: DC

## 2018-10-24 NOTE — Progress Notes (Signed)
Subjective:    Patient ID: [name] [name], female    DOB: 02-18-1956, 63 y.o.   MRN: [id number]  HPI: [name] [name] is a 63 y.o. female her appointment was changed, due to national recommendations of social distancing due to [place] 19, an audio/video telehealth visit is felt to be most appropriate for this patient at this time.  See Chart message from today for the patient's consent to telehealth from [facility].     She states her pain is located in her lower back radiating into her buttocks. She rates her pain 8. He current exercise regime is walking and performing stretching exercises.  [name] [name] Morphine equivalent is 75.00MME. Last Oral Swab was Performed on 08/28/2018, it was consistent.   [name] [name] CMA asked the Health and History Questions. This provider and [name] [name]  verified we were speaking with the correct person using two identifiers.   Pain Inventory Average Pain 8 Pain Right Now 8 My pain is constant, sharp and aching  In the last 24 hours, has pain interfered with the following? General activity 8 Relation with others 8 Enjoyment of life 8 What TIME of day is your pain at its worst? morning, daytime, night Sleep (in general) Fair  Pain is worse with: walking and inactivity Pain improves with: rest, heat/ice and medication Relief from Meds: 1  Mobility walk without assistance how many minutes can you walk? 5 ability to climb steps?  yes do you drive?  yes  Function disabled: date disabled .  Neuro/Psych spasms  Prior Studies Any changes since last visit?  no  Physicians involved in your care Any changes since last visit?  no   Family History  Problem Relation Age of Onset  . Heart disease Mother 93       CHF; CAD  . Hypertension Mother   . Heart failure Mother   . Diabetes Sister   . Lupus Sister   . Kidney failure Sister   . Hypertension Brother   . Hyperlipidemia Sister   .  Hypertension Sister   . Colon cancer Neg Hx   . Rectal cancer Neg Hx    Social History   Socioeconomic History  . Marital status: Widowed    Spouse name: Not on file  . Number of children: 3  . Years of education: Not on file  . Highest education level: Not on file  Occupational History  . 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 Financial resource strain: Not on file  . Food insecurity:    Worry: Not on file    Inability: Not on file  . Transportation needs:    Medical: Not on file    Non-medical: Not on file  Tobacco Use  . Smoking status: Former Smoker    Packs/day: 0.50    Years: 20.00    Pack years: 10.00    Types: Cigarettes    Last attempt to quit: 07/21/2008    Years since quitting: 10.2  . Smokeless tobacco: Never Used  Substance and Sexual Activity  . Alcohol use: No  . Drug use: No  . Sexual activity: Not on file  Lifestyle  . Physical activity:    Days per week: Not on file    Minutes per session: Not on file  . Stress: Not on file  Relationships  . Social connections:    Talks on phone: Not on file    Gets  together: Not on file    Attends religious service: Not on file    Active member of club or organization: Not on file    Attends meetings of clubs or organizations: Not on file    Relationship status: Not on file  Other Topics Concern  . Not on file  Social History Narrative   Marital status: widowed since 2002; not dating which is bad in 2018      Children: 3 children (45, 26, 55); 13 grandchildren; 1 gg      Employment: disability since 2009; retail      Lives: alone in [place] in apartment      Tobacco: quit in 2011      Alcohol: none      Drugs: none      Exercise: minimal in 2018      ADLs: independent with ADLs; drives      Seatbelt: 100%; no texting.   Past Surgical History:  Procedure Laterality Date  . ABDOMINAL HYSTERECTOMY     DUB; ovaries intact  . BREAST BIOPSY    . CARDIAC CATHETERIZATION   1990's   [name]  . COLON SURGERY  05/11/11   [facility] Lysis of adhesions Crohn's  . MASS EXCISION Left 01/03/2018   Procedure: EXCISION LEFT UPPER BACK LIPOMA ERAS PATH;  Surgeon: Erroll Luna, MD;  Location: [place];  Service: General;  Laterality: Left;  . REDUCTION MAMMAPLASTY    . SPINE SURGERY     Past Medical History:  Diagnosis Date  . Blood transfusion without reported diagnosis   . Crohn's disease ([place])   . Disorder of sacroiliac joint   . Hyperlipidemia   . Hypertension   . Ischemic colitis ([place])   . Lumbago   . Lumbar post-laminectomy syndrome   . Lumbosacral neuritis   . Lumbosacral radiculitis   . Sciatica    There were no vitals taken for this visit.  Opioid Risk Score:   Fall Risk Score:  `1  Depression screen PHQ 2/9  Depression screen [facility] 2/9 09/27/2018 08/24/2018 05/08/2018 03/07/2018 10/18/2017 09/18/2017 07/19/2017  Decreased Interest 0 0 0 0 0 0 0  Down, Depressed, Hopeless 0 0 0 0 0 0 3  PHQ - 2 Score 0 0 0 0 0 0 3  Altered sleeping - 0 - - - 0 -  Tired, decreased energy - 0 - - - 0 -  Change in appetite - 0 - - - 0 -  Feeling bad or failure about yourself  - 0 - - - 0 -  Trouble concentrating - 0 - - - 0 -  Moving slowly or fidgety/restless - 0 - - - 0 -  Suicidal thoughts - 0 - - - 0 -  PHQ-9 Score - 0 - - - 0 -  Difficult doing work/chores - Not difficult at all - - - - -  Some recent data might be hidden    Review of Systems  Constitutional: Negative.   HENT: Negative.   Eyes: Negative.   Respiratory: Negative.   Endocrine: Negative.   Genitourinary: Negative.   Musculoskeletal: Positive for back pain.       Spasms  Skin: Negative.   Allergic/Immunologic: Negative.   Neurological: Negative.   Hematological: Negative.   Psychiatric/Behavioral: Negative.   All other systems reviewed and are negative.      Objective:   Physical Exam Vitals signs and nursing note reviewed.  Musculoskeletal:     Comments: No Physical  Exam  Perform: Virtual Visit  Neurological:     Mental Status: She is oriented to person, place, and time.           Assessment & Plan:  1.Lumbar Postlaminectomy/ Spinal Stenosis Lumbar Region/ Post-Op Pain/Low back pain with radiating symptoms in L3-4 distribution: continues to be limited by pain. Refilled:MSIR 15 mg one tablet twice a day as needed for moderate to sever pain#60andMorphabond 15 mg Q 8 hours.10/24/2018. We will continue the opioid monitoring program, this consists of regular clinic visits, examinations, urine drug screen, pill counts as well as use of [place] Controlled Substance Reporting System. Encouraged to Continue exercise regime.  2. Lumbar Radiculopathy: No complaints today.Continue to Monitor05/11/2018 3. Chronic Right Knee Pain:No complaints today. Continue with Ice TherapyandVoltaren Gel. 10/24/2018 4. Muscle Spasm: Continuecurrent treatment: Tizanidine.05//11/2018 5. Lipoma: S/P Left Lipoma Excision of upper back By Dr. Brantley Stage on 01/03/2018. Surgery Following. Continue to Monitor. 10/24/2018. 6. Paresthesia of Skin: ContinueTopamax.10/24/2018 7. Adhesive Capsulitis of Right Shoulder: S/PU/S Cortisoneinjection on 07/02/2018 with some relief noted.. Continue to monitor. Continue HEP as Tolerated.10/24/2018   F/U in 1 month  Telephone Call  Location of patient: In her Home Location of provider: Office Established patient Time spent on call: 10 Minutes

## 2018-10-29 ENCOUNTER — Ambulatory Visit: Payer: Medicare Other | Admitting: Registered Nurse

## 2018-11-23 ENCOUNTER — Ambulatory Visit: Payer: Medicare Other | Admitting: Family Medicine

## 2018-11-27 ENCOUNTER — Other Ambulatory Visit: Payer: Self-pay

## 2018-11-27 ENCOUNTER — Encounter: Payer: Medicare Other | Attending: Physical Medicine & Rehabilitation | Admitting: Registered Nurse

## 2018-11-27 ENCOUNTER — Encounter: Payer: Self-pay | Admitting: Registered Nurse

## 2018-11-27 VITALS — BP 122/83 | HR 62 | Resp 14 | Ht 64.0 in | Wt 215.0 lb

## 2018-11-27 DIAGNOSIS — M48061 Spinal stenosis, lumbar region without neurogenic claudication: Secondary | ICD-10-CM | POA: Diagnosis not present

## 2018-11-27 DIAGNOSIS — M5414 Radiculopathy, thoracic region: Secondary | ICD-10-CM | POA: Insufficient documentation

## 2018-11-27 DIAGNOSIS — M5417 Radiculopathy, lumbosacral region: Secondary | ICD-10-CM | POA: Diagnosis not present

## 2018-11-27 DIAGNOSIS — M62838 Other muscle spasm: Secondary | ICD-10-CM

## 2018-11-27 DIAGNOSIS — M961 Postlaminectomy syndrome, not elsewhere classified: Secondary | ICD-10-CM | POA: Insufficient documentation

## 2018-11-27 DIAGNOSIS — Z79891 Long term (current) use of opiate analgesic: Secondary | ICD-10-CM | POA: Diagnosis not present

## 2018-11-27 DIAGNOSIS — G894 Chronic pain syndrome: Secondary | ICD-10-CM

## 2018-11-27 DIAGNOSIS — Z5181 Encounter for therapeutic drug level monitoring: Secondary | ICD-10-CM | POA: Diagnosis not present

## 2018-11-27 MED ORDER — MORPHINE SULFATE 15 MG PO TABS: 15.0000 mg | ORAL_TABLET | Freq: Two times a day (BID) | ORAL | 0 refills | Status: DC | PRN

## 2018-11-27 MED ORDER — MORPHINE SULFATE ER 15 MG PO T12A: 15.0000 mg | EXTENDED_RELEASE_TABLET | Freq: Three times a day (TID) | ORAL | 0 refills | Status: DC

## 2018-11-27 NOTE — Progress Notes (Signed)
Subjective:    Patient ID: [name] [name], female    DOB: Mar 19, 1956, 63 y.o.   MRN: [id number]  HPI: [name] [name] is a 63 y.o. female who returns for follow up appointment for chronic pain and medication refill. She states her pain is located in her lower back. She rates her  Pain 8. Her  current exercise regime is walking and performing stretching exercises.  [name] Morphine equivalent is 75.00 MME.  Last Oral Swab was Performed on 08/28/2018, it was consistent.    Pain Inventory Average Pain 8 Pain Right Now 8 My pain is constant, sharp and aching  In the last 24 hours, has pain interfered with the following? General activity 8 Relation with others 8 Enjoyment of life 8 What TIME of day is your pain at its worst? morning, daytime, night Sleep (in general) Fair  Pain is worse with: inactivity Pain improves with: rest, heat/ice and medication Relief from Meds: 1  Mobility walk without assistance how many minutes can you walk? 5 ability to climb steps?  yes do you drive?  yes Do you have any goals in this area?  yes  Function  Neuro/Psych spasms  Prior Studies Any changes since last visit?  no  Physicians involved in your care Any changes since last visit?  no   Family History  Problem Relation Age of Onset  . Heart disease Mother 46       CHF; CAD  . Hypertension Mother   . Heart failure Mother   . Diabetes Sister   . Lupus Sister   . Kidney failure Sister   . Hypertension Brother   . Hyperlipidemia Sister   . Hypertension Sister   . Colon cancer Neg Hx   . Rectal cancer Neg Hx    Social History   Socioeconomic History  . Marital status: Widowed    Spouse name: Not on file  . Number of children: 3  . Years of education: Not on file  . Highest education level: Not on file  Occupational History  . 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 Financial resource strain: Not on file  . Food insecurity:   Worry: Not on file    Inability: Not on file  . Transportation needs:    Medical: Not on file    Non-medical: Not on file  Tobacco Use  . Smoking status: Former Smoker    Packs/day: 0.50    Years: 20.00    Pack years: 10.00    Types: Cigarettes    Last attempt to quit: 07/21/2008    Years since quitting: 10.3  . Smokeless tobacco: Never Used  Substance and Sexual Activity  . Alcohol use: No  . Drug use: No  . Sexual activity: Not on file  Lifestyle  . Physical activity:    Days per week: Not on file    Minutes per session: Not on file  . Stress: Not on file  Relationships  . Social connections:    Talks on phone: Not on file    Gets together: Not on file    Attends religious service: Not on file    Active member of club or organization: Not on file    Attends meetings of clubs or organizations: Not on file    Relationship status: Not on file  Other Topics Concern  . Not on file  Social History Narrative   Marital status: widowed since 2002; not dating  which is bad in 2018      Children: 3 children (45, 42, 40); 13 grandchildren; 1 gg      Employment: disability since 2009; retail      Lives: alone in [place] in apartment      Tobacco: quit in 2011      Alcohol: none      Drugs: none      Exercise: minimal in 2018      ADLs: independent with ADLs; drives      Seatbelt: 100%; no texting.   Past Surgical History:  Procedure Laterality Date  . ABDOMINAL HYSTERECTOMY     DUB; ovaries intact  . BREAST BIOPSY    . CARDIAC CATHETERIZATION  1990's   [name]  . COLON SURGERY  05/11/11   [facility] Lysis of adhesions Crohn's  . MASS EXCISION Left 01/03/2018   Procedure: EXCISION LEFT UPPER BACK LIPOMA ERAS PATH;  Surgeon: Erroll Luna, MD;  Location: [place];  Service: General;  Laterality: Left;  . REDUCTION MAMMAPLASTY    . SPINE SURGERY     Past Medical History:  Diagnosis Date  . Blood transfusion without reported diagnosis   . Crohn's disease  ([place])   . Disorder of sacroiliac joint   . Hyperlipidemia   . Hypertension   . Ischemic colitis ([place])   . Lumbago   . Lumbar post-laminectomy syndrome   . Lumbosacral neuritis   . Lumbosacral radiculitis   . Sciatica    BP 122/83   Pulse 62   Resp 14   Ht 5\' 4"  (1.626 m)   Wt 215 lb (97.5 kg)   SpO2 96%   BMI 36.90 kg/m   Opioid Risk Score:   Fall Risk Score:  `1  Depression screen PHQ 2/9  Depression screen [facility] 2/9 09/27/2018 08/24/2018 05/08/2018 03/07/2018 10/18/2017 09/18/2017 07/19/2017  Decreased Interest 0 0 0 0 0 0 0  Down, Depressed, Hopeless 0 0 0 0 0 0 3  PHQ - 2 Score 0 0 0 0 0 0 3  Altered sleeping - 0 - - - 0 -  Tired, decreased energy - 0 - - - 0 -  Change in appetite - 0 - - - 0 -  Feeling bad or failure about yourself  - 0 - - - 0 -  Trouble concentrating - 0 - - - 0 -  Moving slowly or fidgety/restless - 0 - - - 0 -  Suicidal thoughts - 0 - - - 0 -  PHQ-9 Score - 0 - - - 0 -  Difficult doing work/chores - Not difficult at all - - - - -  Some recent data might be hidden    Review of Systems  Constitutional: Positive for diaphoresis.  HENT: Negative.   Respiratory: Negative.   Cardiovascular: Negative.   Gastrointestinal: Negative.   Endocrine: Negative.   Genitourinary: Negative.   Musculoskeletal: Positive for back pain.       Spasms   Neurological: Negative.   Hematological: Negative.   Psychiatric/Behavioral: Negative.   All other systems reviewed and are negative.      Objective:   Physical Exam Vitals signs and nursing note reviewed.  Constitutional:      Appearance: Normal appearance.  Neck:     Musculoskeletal: Normal range of motion and neck supple.  Cardiovascular:     Rate and Rhythm: Normal rate and regular rhythm.     Pulses: Normal pulses.     Heart sounds: Normal heart sounds.  Pulmonary:     Effort: Pulmonary effort is normal.     Breath sounds: Normal breath sounds.  Musculoskeletal:     Comments: Normal Muscle Bulk and  Muscle Testing Reveals:  Upper Extremities: Full ROM and Muscle Strength 5/5  Lumbar Paraspinal Tenderness: L-4-L-5 Lower Extremities: Full ROM and Muscle Strength 5/5 Arises from Table with Ease Narrow Based  Gait   Skin:    General: Skin is warm and dry.  Neurological:     Mental Status: She is alert and oriented to person, place, and time.  Psychiatric:        Mood and Affect: Mood normal.        Behavior: Behavior normal.           Assessment & Plan:  1.Lumbar Postlaminectomy/ Spinal Stenosis Lumbar Region/ Post-Op Pain/Low back pain with radiating symptoms in L3-4 distribution: continues to be limited by pain. Refilled:MSIR 15 mg one tablet twice a day as needed for moderate to sever pain#60andMorphabond 15 mg Q 8 hours.11/27/2018. We will continue the opioid monitoring program, this consists of regular clinic visits, examinations, urine drug screen, pill counts as well as use of [place] Controlled Substance Reporting System. Encouraged to Continue exercise regime.  2. Lumbar Radiculopathy: No complaints today.Continue to Monitor06/02/2019 3. Chronic Right Knee Pain:No complaints today. Continue with Ice TherapyandVoltaren Gel. 11/27/2018 4. Muscle Spasm: Continuecurrent treatment: Tizanidine.06//02/2019 5. Lipoma: S/P Left Lipoma Excision of upper back By Dr. Brantley Stage on 01/03/2018. Surgery Following. Continue to Monitor. 11/27/2018. 6. Paresthesia of Skin: ContinueTopamax.11/27/2018 7. Adhesive Capsulitis of Right Shoulder: S/PU/S Cortisoneinjectionon 01/13/2020with some relief noted.. Continue to monitor. Continue HEP as Tolerated.11/27/2018  F/U in 1 month  15 minutes of face to face patient care time was spent during this visit. All questions were encouraged and answered.

## 2018-11-29 ENCOUNTER — Telehealth: Payer: Self-pay | Admitting: Physical Medicine & Rehabilitation

## 2018-11-29 MED ORDER — MORPHABOND ER 15 MG PO T12A: 15.0000 mg | EXTENDED_RELEASE_TABLET | Freq: Three times a day (TID) | ORAL | 0 refills | Status: DC

## 2018-11-29 NOTE — Telephone Encounter (Signed)
[facility] called to let us know [facility] Morphabond ER until next Tuesday.  She would like to know if [facility] could [facility] [address]  The pharmacy wouldn't transfer it due to be a controlled substance.  Please call patient.

## 2018-11-29 NOTE — Telephone Encounter (Signed)
[facility] [place] [facility]ond [facility]. [facility] [place]. Placed a call to [name] [name] regarding the above, no answer, left message.

## 2018-12-25 ENCOUNTER — Other Ambulatory Visit: Payer: Self-pay

## 2018-12-25 ENCOUNTER — Encounter: Payer: Self-pay | Admitting: Registered Nurse

## 2018-12-25 ENCOUNTER — Encounter: Payer: Medicare Other | Attending: Physical Medicine & Rehabilitation | Admitting: Registered Nurse

## 2018-12-25 VITALS — BP 134/95 | HR 74 | Temp 97.6°F | Ht 64.0 in | Wt 214.0 lb

## 2018-12-25 DIAGNOSIS — Z5181 Encounter for therapeutic drug level monitoring: Secondary | ICD-10-CM

## 2018-12-25 DIAGNOSIS — M961 Postlaminectomy syndrome, not elsewhere classified: Secondary | ICD-10-CM | POA: Insufficient documentation

## 2018-12-25 DIAGNOSIS — M5417 Radiculopathy, lumbosacral region: Secondary | ICD-10-CM | POA: Diagnosis not present

## 2018-12-25 DIAGNOSIS — M5414 Radiculopathy, thoracic region: Secondary | ICD-10-CM | POA: Insufficient documentation

## 2018-12-25 DIAGNOSIS — G894 Chronic pain syndrome: Secondary | ICD-10-CM

## 2018-12-25 DIAGNOSIS — Z79891 Long term (current) use of opiate analgesic: Secondary | ICD-10-CM

## 2018-12-25 DIAGNOSIS — R202 Paresthesia of skin: Secondary | ICD-10-CM

## 2018-12-25 DIAGNOSIS — M48061 Spinal stenosis, lumbar region without neurogenic claudication: Secondary | ICD-10-CM

## 2018-12-25 DIAGNOSIS — M62838 Other muscle spasm: Secondary | ICD-10-CM

## 2018-12-25 MED ORDER — MORPHINE SULFATE 15 MG PO TABS: 15.0000 mg | ORAL_TABLET | Freq: Two times a day (BID) | ORAL | 0 refills | Status: DC | PRN

## 2018-12-25 MED ORDER — MORPHABOND ER 15 MG PO T12A: 15.0000 mg | EXTENDED_RELEASE_TABLET | Freq: Three times a day (TID) | ORAL | 0 refills | Status: DC

## 2018-12-25 NOTE — Progress Notes (Signed)
Subjective:    Patient ID: [name] [name], female    DOB: Jun 06, 1956, 63 y.o.   MRN: [id number]  HPI: [name] [name] is a 63 y.o. female who returns for follow up appointment for chronic pain and medication refill. She states her pain is located in her lower back. She rates her  Pain 8. Her current exercise regime is walking and performing stretching exercises.  [name] [name] Morphine equivalent is 75.00 MME.  Last Oral Swab was Performed on 08/28/2018, it was consistent.   Pain Inventory Average Pain 8 Pain Right Now 8 My pain is intermittent and sharp  In the last 24 hours, has pain interfered with the following? General activity 9 Relation with others 9 Enjoyment of life 9 What TIME of day is your pain at its worst? all the time Sleep (in general) Fair  Pain is worse with: walking and standing Pain improves with: rest, heat/ice and medication Relief from Meds: 1  Mobility walk without assistance  Function disabled: date disabled 2009  Neuro/Psych spasms  Prior Studies no  Physicians involved in your care no   Family History  Problem Relation Age of Onset  . Heart disease Mother 79       CHF; CAD  . Hypertension Mother   . Heart failure Mother   . Diabetes Sister   . Lupus Sister   . Kidney failure Sister   . Hypertension Brother   . Hyperlipidemia Sister   . Hypertension Sister   . Colon cancer Neg Hx   . Rectal cancer Neg Hx    Social History   Socioeconomic History  . Marital status: Widowed    Spouse name: Not on file  . Number of children: 3  . Years of education: Not on file  . Highest education level: Not on file  Occupational History  . 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 Financial resource strain: Not on file  . Food insecurity    Worry: Not on file    Inability: Not on file  . Transportation needs    Medical: Not on file    Non-medical: Not on file  Tobacco Use  . Smoking status: Former  Smoker    Packs/day: 0.50    Years: 20.00    Pack years: 10.00    Types: Cigarettes    Quit date: 07/21/2008    Years since quitting: 10.4  . Smokeless tobacco: Never Used  Substance and Sexual Activity  . Alcohol use: No  . Drug use: No  . Sexual activity: Not on file  Lifestyle  . Physical activity    Days per week: Not on file    Minutes per session: Not on file  . Stress: Not on file  Relationships  . Social Herbalist on phone: Not on file    Gets together: Not on file    Attends religious service: Not on file    Active member of club or organization: Not on file    Attends meetings of clubs or organizations: Not on file    Relationship status: Not on file  Other Topics Concern  . 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   Past Surgical History:  Procedure Laterality Date  . ABDOMINAL HYSTERECTOMY     DUB; ovaries intact  . BREAST BIOPSY    . CARDIAC CATHETERIZATION  1990's   [name] [name]  . COLON SURGERY  05/11/11   [facility] Lysis of adhesions Crohn's  . MASS EXCISION Left 01/03/2018   Procedure: EXCISION LEFT UPPER BACK LIPOMA ERAS PATH;  Surgeon: [name] Luna, MD;  Location: [place];  Service: General;  Laterality: Left;  . REDUCTION MAMMAPLASTY    . SPINE SURGERY     Past Medical History:  Diagnosis Date  . Blood transfusion without reported diagnosis   . Crohn's disease ([place])   . Disorder of sacroiliac joint   . Hyperlipidemia   . Hypertension   . Ischemic colitis ([place])   . Lumbago   . Lumbar post-laminectomy syndrome   . Lumbosacral  neuritis   . Lumbosacral radiculitis   . Sciatica    There were no vitals taken for this visit.  Opioid Risk Score:   Fall Risk Score:  `1  Depression screen PHQ 2/9  Depression screen [facility] 2/9 09/27/2018 08/24/2018 05/08/2018 03/07/2018 10/18/2017 09/18/2017 07/19/2017  Decreased Interest 0 0 0 0 0 0 0  Down, Depressed, Hopeless 0 0 0 0 0 0 3  PHQ - 2 Score 0 0 0 0 0 0 3  Altered sleeping - 0 - - - 0 -  Tired, decreased energy - 0 - - - 0 -  Change in appetite - 0 - - - 0 -  Feeling bad or failure about yourself  - 0 - - - 0 -  Trouble concentrating - 0 - - - 0 -  Moving slowly or fidgety/restless - 0 - - - 0 -  Suicidal thoughts - 0 - - - 0 -  PHQ-9 Score - 0 - - - 0 -  Difficult doing work/chores - Not difficult at all - - - - -  Some recent data might be hidden      Review of Systems  All other systems reviewed and are negative.      Objective:   Physical Exam Vitals signs and nursing note reviewed.  Constitutional:      Appearance: Normal appearance.  Neck:     Musculoskeletal: Normal range of motion and neck supple.  Cardiovascular:     Rate and Rhythm: Normal rate and regular rhythm.     Pulses: Normal pulses.     Heart sounds: Normal heart sounds.  Pulmonary:     Effort: Pulmonary effort is normal.     Breath sounds: Normal breath sounds.  Musculoskeletal:     Comments: Normal Muscle Bulk and Muscle Testing Reveals:  Upper Extremities: Full ROM and Muscle Strength 5/5  Lumbar Paraspinal Tenderness: L-4-L-5 Lower Extremities: Full ROM and Muscle Strength 5/5 Arises from table with ease Narrow Based Gait   Skin:    General: Skin is warm and dry.  Neurological:     Mental Status: She is alert and oriented to person, place, and time.  Psychiatric:        Mood and Affect: Mood normal.        Behavior:  Behavior normal.           Assessment & Plan:  1.Lumbar Postlaminectomy/ Spinal Stenosis Lumbar Region/ Post-Op Pain/Low back pain with radiating symptoms in  L3-4 distribution: continues to be limited by pain. Refilled:MSIR 15 mg one tablet twice a day as needed for moderate to sever pain#60andMorphabond 15 mg Q 8 hours.12/25/2018. We will continue the opioid monitoring program, this consists of regular clinic visits, examinations, urine drug screen, pill counts as well as use of [place] Controlled Substance Reporting System. Encouraged to Continue exercise regime.  2. Lumbar Radiculopathy: No complaints today.Continue to Monitor07/12/2018 3. Chronic Right Knee Pain:No complaints today. Continue with Ice TherapyandVoltaren Gel. 12/25/2018 4. Muscle Spasm: Continuecurrent treatment: Tizanidine.07//12/2018 5. Lipoma: S/P Left Lipoma Excision of upper back By Dr. Brantley Stage on 01/03/2018. Surgery Following. Continue to Monitor. 12/25/2018. 6. Paresthesia of Skin: ContinueTopamax.12/25/2018 7. Adhesive Capsulitis of Right Shoulder: S/PU/S Cortisoneinjectionon 01/13/2020with some relief noted.. Continue to monitor. Continue HEP as Tolerated.12/25/2018  F/U in 1 month  15 minutes of face to face patient care time was spent during this visit. All questions were encouraged and answered.

## 2018-12-27 ENCOUNTER — Telehealth: Payer: Self-pay

## 2018-12-27 MED ORDER — MORPHABOND ER 15 MG PO T12A: 15.0000 mg | EXTENDED_RELEASE_TABLET | Freq: Three times a day (TID) | ORAL | 0 refills | Status: DC

## 2018-12-27 NOTE — Telephone Encounter (Signed)
Morphabond e-scribed,

## 2018-12-27 NOTE — Telephone Encounter (Signed)
[facility], stated does not currently have morpabond ER in stock at this time and will not be there until Monday, stated that they have [facility] [address] and [address] [phone] in [place] and they have it currently in [place].

## 2019-01-07 ENCOUNTER — Telehealth: Payer: Self-pay

## 2019-01-07 MED ORDER — MORPHINE SULFATE ER 15 MG PO T12A: 1.0000 | EXTENDED_RELEASE_TABLET | Freq: Three times a day (TID) | ORAL | 0 refills | Status: DC

## 2019-01-07 NOTE — Telephone Encounter (Signed)
[facility] called stating was told by insurance/pharmacy that her prescription for morpabond is no longer covered by her insurance company.  Reviewed her formulary for her insurance, (generic) morphine sulfate er oral tablet extended release is marked at tier 1

## 2019-01-07 NOTE — Telephone Encounter (Signed)
[name] [name] insurance no longer covering Morphabond. She will be changed to Morphine Sulfate ER 15 mg one tablet every 12 hours. In the past she was prescribed Xtampza , not receiving relief of her pain with Xtampza. [name] [name] is aware of the above and she verbalizes understanding.   [name] [name] was filled on 12/27/2018. We will change to Morphine Sulfate ER with her next visit.

## 2019-01-25 ENCOUNTER — Encounter: Payer: Medicare Other | Admitting: Registered Nurse

## 2019-01-25 ENCOUNTER — Telehealth: Payer: Self-pay | Admitting: Registered Nurse

## 2019-01-25 MED ORDER — MORPHINE SULFATE 15 MG PO TABS: 15.0000 mg | ORAL_TABLET | Freq: Two times a day (BID) | ORAL | 0 refills | Status: DC | PRN

## 2019-01-25 MED ORDER — MORPHINE SULFATE ER 15 MG PO T12A: 1.0000 | EXTENDED_RELEASE_TABLET | Freq: Three times a day (TID) | ORAL | 0 refills | Status: DC

## 2019-01-25 NOTE — Telephone Encounter (Signed)
Pt had to reschedule her appt today because of stomach issues. She will be out of medication today. She is rescheduled for 01/30/19.

## 2019-01-25 NOTE — Telephone Encounter (Signed)
Ms. Nuttle Has been on Morphabond 15 mg one tablet every 8 hours #90. Morphabond no longer covered by her insurance. Morphine Sulfate ER and Morphine IR e-scribed. PMP was reviewed. Placed a call to Ms. Pearse, she is aware of the above and verbalizes understanding.

## 2019-01-30 ENCOUNTER — Other Ambulatory Visit: Payer: Self-pay

## 2019-01-30 ENCOUNTER — Encounter: Payer: Self-pay | Admitting: Registered Nurse

## 2019-01-30 ENCOUNTER — Encounter: Payer: Medicare Other | Attending: Physical Medicine & Rehabilitation | Admitting: Registered Nurse

## 2019-01-30 VITALS — BP 133/74 | HR 60 | Temp 97.7°F | Resp 18 | Ht 64.0 in | Wt 209.8 lb

## 2019-01-30 DIAGNOSIS — M5414 Radiculopathy, thoracic region: Secondary | ICD-10-CM | POA: Insufficient documentation

## 2019-01-30 DIAGNOSIS — M961 Postlaminectomy syndrome, not elsewhere classified: Secondary | ICD-10-CM | POA: Diagnosis not present

## 2019-01-30 DIAGNOSIS — M5417 Radiculopathy, lumbosacral region: Secondary | ICD-10-CM | POA: Insufficient documentation

## 2019-01-30 DIAGNOSIS — Z5181 Encounter for therapeutic drug level monitoring: Secondary | ICD-10-CM

## 2019-01-30 DIAGNOSIS — G894 Chronic pain syndrome: Secondary | ICD-10-CM | POA: Diagnosis not present

## 2019-01-30 DIAGNOSIS — M48061 Spinal stenosis, lumbar region without neurogenic claudication: Secondary | ICD-10-CM | POA: Diagnosis not present

## 2019-01-30 DIAGNOSIS — M5416 Radiculopathy, lumbar region: Secondary | ICD-10-CM | POA: Diagnosis not present

## 2019-01-30 DIAGNOSIS — Z79891 Long term (current) use of opiate analgesic: Secondary | ICD-10-CM

## 2019-01-30 MED ORDER — TOPIRAMATE 25 MG PO TABS: ORAL_TABLET | ORAL | 2 refills | Status: DC

## 2019-01-30 MED ORDER — TIZANIDINE HCL 2 MG PO TABS: 2.0000 mg | ORAL_TABLET | Freq: Two times a day (BID) | ORAL | 3 refills | Status: DC | PRN

## 2019-01-30 NOTE — Progress Notes (Signed)
Subjective:    Patient ID: [name] [name], female    DOB: 1955-09-24, 63 y.o.   MRN: [id number]  HPI: [name] [name] is a 63 y.o. female who returns for follow up appointment for chronic pain and medication refill. She states her  pain is located in her lower back radiating right lower extremity. She rates her pain 8. Her current exercise regime is walking and performing stretching exercises.  [name] [name] equivalent is 75.00  MME.  Last Oral Swab was Performed on 08/28/2018, it was consistent.     Pain Inventory Average Pain 8 Pain Right Now 8 My pain is intermittent, stabbing and aching  In the last 24 hours, has pain interfered with the following? General activity 9 Relation with others 9 Enjoyment of life 9 What TIME of day is your pain at its worst? all Sleep (in general) Fair  Pain is worse with: walking and standing Pain improves with: rest, heat/ice and medication Relief from Meds: 2  Mobility walk with assistance use a cane ability to climb steps?  yes do you drive?  yes Do you have any goals in this area?  yes  Function disabled: date disabled 2009 Do you have any goals in this area?  yes  Neuro/Psych spasms  Prior Studies Any changes since last visit?  no  Physicians involved in your care Any changes since last visit?  no   Family History  Problem Relation Age of Onset  . Heart disease Mother 35       CHF; CAD  . Hypertension Mother   . Heart failure Mother   . Diabetes Sister   . Lupus Sister   . Kidney failure Sister   . Hypertension Brother   . Hyperlipidemia Sister   . Hypertension Sister   . Colon cancer Neg Hx   . Rectal cancer Neg Hx    Social History   Socioeconomic History  . Marital status: Widowed    Spouse name: Not on file  . Number of children: 3  . Years of education: Not on file  . Highest education level: Not on file  Occupational History  . 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 Financial resource strain: Not on file  . Food insecurity    Worry: Not on file    Inability: Not on file  . Transportation needs    Medical: Not on file    Non-medical: Not on file  Tobacco Use  . Smoking status: Former Smoker    Packs/day: 0.50    Years: 20.00    Pack years: 10.00    Types: Cigarettes    Quit date: 07/21/2008    Years since quitting: 10.5  . Smokeless tobacco: Never Used  Substance and Sexual Activity  . Alcohol use: No  . Drug use: No  . Sexual activity: Not on file  Lifestyle  . Physical activity    Days per week: Not on file    Minutes per session: Not on file  . Stress: Not on file  Relationships  . Social Herbalist on phone: Not on file    Gets together: Not on file    Attends religious service: Not on file    Active member of club or organization: Not on file    Attends meetings of clubs or organizations: Not on file    Relationship status: Not on file  Other Topics Concern  . Not  on file  Social History Narrative   Marital status: widowed since 2002; not dating which is bad in 2018      Children: 3 children (45, 5, 7); 13 grandchildren; 1 gg      Employment: disability since 2009; retail      Lives: alone in [place] in apartment      Tobacco: quit in 2011      Alcohol: none      Drugs: none      Exercise: minimal in 2018      ADLs: independent with ADLs; drives      Seatbelt: 100%; no texting.   Past Surgical History:  Procedure Laterality Date  . ABDOMINAL HYSTERECTOMY     DUB; ovaries intact  . BREAST BIOPSY    . CARDIAC CATHETERIZATION  1990's   [name]  . COLON SURGERY  05/11/11   [facility] of adhesions Crohn's  . MASS EXCISION Left 01/03/2018   Procedure: EXCISION LEFT UPPER BACK LIPOMA ERAS PATH;  Surgeon: Erroll Luna, MD;  Location: [facility];  Service: General;  Laterality: Left;  . REDUCTION MAMMAPLASTY    . SPINE SURGERY     Past Medical History:  Diagnosis Date   . Blood transfusion without reported diagnosis   . Crohn's disease ([place])   . Disorder of sacroiliac joint   . Hyperlipidemia   . Hypertension   . Ischemic colitis ([place])   . Lumbago   . Lumbar post-laminectomy syndrome   . Lumbosacral neuritis   . Lumbosacral radiculitis   . Sciatica    There were no vitals taken for this visit.  Opioid Risk Score:   Fall Risk Score:  `1  Depression screen PHQ 2/9  Depression screen [facility] 2/9 09/27/2018 08/24/2018 05/08/2018 03/07/2018 10/18/2017 09/18/2017 07/19/2017  Decreased Interest 0 0 0 0 0 0 0  Down, Depressed, Hopeless 0 0 0 0 0 0 3  PHQ - 2 Score 0 0 0 0 0 0 3  Altered sleeping - 0 - - - 0 -  Tired, decreased energy - 0 - - - 0 -  Change in appetite - 0 - - - 0 -  Feeling bad or failure about yourself  - 0 - - - 0 -  Trouble concentrating - 0 - - - 0 -  Moving slowly or fidgety/restless - 0 - - - 0 -  Suicidal thoughts - 0 - - - 0 -  PHQ-9 Score - 0 - - - 0 -  Difficult doing work/chores - Not difficult at all - - - - -  Some recent data might be hidden     Review of Systems  Constitutional: Positive for diaphoresis.  HENT: Negative.   Eyes: Negative.   Respiratory: Negative.   Cardiovascular: Negative.   Gastrointestinal: Negative.   Endocrine: Negative.   Genitourinary: Negative.   Musculoskeletal: Positive for back pain, gait problem and myalgias.  Skin: Negative.   Allergic/Immunologic: Negative.   Psychiatric/Behavioral: Negative.   All other systems reviewed and are negative.      Objective:   Physical Exam Vitals signs and nursing note reviewed.  Constitutional:      Appearance: Normal appearance.  Neck:     Musculoskeletal: Neck supple.  Cardiovascular:     Rate and Rhythm: Normal rate and regular rhythm.     Pulses: Normal pulses.     Heart sounds: Normal heart sounds.  Pulmonary:     Effort: Pulmonary effort is normal.  Breath sounds: Normal breath sounds.  Musculoskeletal:     Comments: Normal Muscle  Bulk and Muscle Testing Reveals:  Upper Extremities: Full ROM and Muscle Strength 5/5  Lumbar Paraspinal Tenderness: L-4-L-5 Lower Extremities: Full ROM and Muscle Strength 5/5 Arises from Table Slowly Antalgic  Gait   Skin:    General: Skin is warm and dry.  Neurological:     Mental Status: She is alert and oriented to person, place, and time.  Psychiatric:        Mood and Affect: Mood normal.        Behavior: Behavior normal.           Assessment & Plan:  1.Lumbar Postlaminectomy/ Spinal Stenosis Lumbar Region/ Post-Op Pain/Low back pain with radiating symptoms in L3-4 distribution: continues to be limited by pain. Refilled:MSIR 15 mg one tablet twice a day as needed for moderate to sever pain#60andMorphabond 15 mg Q 8 hours.01/30/2019. We will continue the opioid monitoring program, this consists of regular clinic visits, examinations, urine drug screen, pill counts as well as use of [place] Controlled Substance Reporting System. Encouraged to Continue exercise regime.  2. Lumbar Radiculopathy: Resume Topamax:.Continue to Monitor08/05/2019 3. Chronic Right Knee Pain:No complaints today. Continue with Ice TherapyandVoltaren Gel. 01/30/2019 4. Muscle Spasm: Continuecurrent treatment: Tizanidine.08//05/2019 5. Lipoma: S/P Left Lipoma Excision of upper back By Dr. Brantley Stage on 01/03/2018. Surgery Following. Continue to Monitor. 01/30/2019. 6. Paresthesia of Skin: ContinueTopamax.01/30/2019 7. Adhesive Capsulitis of Right Shoulder: S/PU/S Cortisoneinjectionon 01/13/2020with some relief noted.. Continue to monitor. Continue HEP as Tolerated.01/30/2019  15 minutes of face to face patient care time was spent during this visit. All questions were encouraged and answered.  F/U in 1 month

## 2019-02-20 ENCOUNTER — Encounter: Payer: Medicare Other | Admitting: Physical Medicine & Rehabilitation

## 2019-02-21 ENCOUNTER — Encounter: Payer: Medicare Other | Attending: Physical Medicine & Rehabilitation | Admitting: Registered Nurse

## 2019-02-21 ENCOUNTER — Other Ambulatory Visit: Payer: Self-pay

## 2019-02-21 ENCOUNTER — Encounter: Payer: Self-pay | Admitting: Registered Nurse

## 2019-02-21 VITALS — BP 158/91 | HR 86 | Temp 98.6°F | Ht 64.0 in | Wt 209.6 lb

## 2019-02-21 DIAGNOSIS — M5417 Radiculopathy, lumbosacral region: Secondary | ICD-10-CM | POA: Insufficient documentation

## 2019-02-21 DIAGNOSIS — Z79891 Long term (current) use of opiate analgesic: Secondary | ICD-10-CM

## 2019-02-21 DIAGNOSIS — M48061 Spinal stenosis, lumbar region without neurogenic claudication: Secondary | ICD-10-CM

## 2019-02-21 DIAGNOSIS — Z5181 Encounter for therapeutic drug level monitoring: Secondary | ICD-10-CM

## 2019-02-21 DIAGNOSIS — M5416 Radiculopathy, lumbar region: Secondary | ICD-10-CM | POA: Diagnosis not present

## 2019-02-21 DIAGNOSIS — M5414 Radiculopathy, thoracic region: Secondary | ICD-10-CM | POA: Insufficient documentation

## 2019-02-21 DIAGNOSIS — M62838 Other muscle spasm: Secondary | ICD-10-CM

## 2019-02-21 DIAGNOSIS — M961 Postlaminectomy syndrome, not elsewhere classified: Secondary | ICD-10-CM | POA: Insufficient documentation

## 2019-02-21 DIAGNOSIS — M7061 Trochanteric bursitis, right hip: Secondary | ICD-10-CM

## 2019-02-21 DIAGNOSIS — G894 Chronic pain syndrome: Secondary | ICD-10-CM

## 2019-02-21 MED ORDER — MORPHINE SULFATE ER 15 MG PO T12A: 1.0000 | EXTENDED_RELEASE_TABLET | Freq: Three times a day (TID) | ORAL | 0 refills | Status: DC

## 2019-02-21 MED ORDER — MORPHINE SULFATE 15 MG PO TABS: 15.0000 mg | ORAL_TABLET | Freq: Two times a day (BID) | ORAL | 0 refills | Status: DC | PRN

## 2019-02-21 MED ORDER — DICLOFENAC SODIUM 1 % TD GEL: 2.0000 g | Freq: Four times a day (QID) | TRANSDERMAL | 3 refills | Status: DC

## 2019-02-21 NOTE — Progress Notes (Signed)
Subjective:    Patient ID: [name] [name], female    DOB: June 10, 1956, 63 y.o.   MRN: [id number]  HPI: [name] [name] is a 63 y.o. female who returns for follow up appointment for chronic pain and medication refill. She states her pain is located in her lower back pain radiating into her right hip and  right lower extremity. She rates her pain 8. Her  current exercise regime is walking, performing stretching exercises and gardening.   Ms. Saragoza Morphine equivalent is 75.00  MME.  Last Oral Swab was Performed on 08/28/2018, it was consistent.   Pain Inventory Average Pain 8 Pain Right Now 8 My pain is intermittent, sharp and stabbing  In the last 24 hours, has pain interfered with the following? General activity 8 Relation with others 9 Enjoyment of life 8 What TIME of day is your pain at its worst? all day Sleep (in general) Poor  Pain is worse with: walking and inactivity Pain improves with: n/a Relief from Meds: 1  Mobility how many minutes can you walk? 5 ability to climb steps?  yes do you drive?  yes Do you have any goals in this area?  yes  Function disabled: date disabled 10/2007 I need assistance with the following:  household duties Do you have any goals in this area?  yes  Neuro/Psych bladder control problems spasms  Prior Studies Any changes since last visit?  no  Physicians involved in your care Any changes since last visit?  no   Family History  Problem Relation Age of Onset  . Heart disease Mother 70       CHF; CAD  . Hypertension Mother   . Heart failure Mother   . Diabetes Sister   . Lupus Sister   . Kidney failure Sister   . Hypertension Brother   . Hyperlipidemia Sister   . Hypertension Sister   . Colon cancer Neg Hx   . Rectal cancer Neg Hx    Social History   Socioeconomic History  . Marital status: Widowed    Spouse name: Not on file  . Number of children: 3  . Years of education: Not on file  . Highest education  level: Not on file  Occupational History  . 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 Financial resource strain: Not on file  . Food insecurity    Worry: Not on file    Inability: Not on file  . Transportation needs    Medical: Not on file    Non-medical: Not on file  Tobacco Use  . Smoking status: Former Smoker    Packs/day: 0.50    Years: 20.00    Pack years: 10.00    Types: Cigarettes    Quit date: 07/21/2008    Years since quitting: 10.5  . Smokeless tobacco: Never Used  Substance and Sexual Activity  . Alcohol use: No  . Drug use: No  . Sexual activity: Not on file  Lifestyle  . Physical activity    Days per week: Not on file    Minutes per session: Not on file  . Stress: Not on file  Relationships  . Social Herbalist on phone: Not on file    Gets together: Not on file    Attends religious service: Not on file    Active member of club or organization: Not on file    Attends meetings of clubs or organizations:  Not on file    Relationship status: Not on file  Other Topics Concern  . Not on file  Social History Narrative   Marital status: widowed since 2002; not dating which is bad in 2018      Children: 3 children (45, 21, 19); 13 grandchildren; 1 gg      Employment: disability since 2009; retail      Lives: alone in [place] in apartment      Tobacco: quit in 2011      Alcohol: none      Drugs: none      Exercise: minimal in 2018      ADLs: independent with ADLs; drives      Seatbelt: 100%; no texting.   Past Surgical History:  Procedure Laterality Date  . ABDOMINAL HYSTERECTOMY     DUB; ovaries intact  . BREAST BIOPSY    . CARDIAC CATHETERIZATION  1990's   [name] [name]  . COLON SURGERY  05/11/11   [name] [name] Crohn's  . MASS EXCISION Left 01/03/2018   Procedure: EXCISION LEFT UPPER BACK LIPOMA ERAS PATH;  Surgeon: [name] Luna, MD;  Location: [facility];  Service: General;   Laterality: Left;  . REDUCTION MAMMAPLASTY    . SPINE SURGERY     Past Medical History:  Diagnosis Date  . Blood transfusion without reported diagnosis   . Crohn's disease ([place])   . Disorder of sacroiliac joint   . Hyperlipidemia   . Hypertension   . Ischemic colitis ([place])   . Lumbago   . Lumbar post-laminectomy syndrome   . Lumbosacral neuritis   . Lumbosacral radiculitis   . Sciatica    BP (!) 158/91   Pulse 86   Temp 98.6 F (37 C)   Ht 5\' 4"  (1.626 m)   Wt 209 lb 9.6 oz (95.1 kg)   SpO2 97%   BMI 35.98 kg/m   Opioid Risk Score:   Fall Risk Score:  `1  Depression screen PHQ 2/9  Depression screen [facility] 2/9 09/27/2018 08/24/2018 05/08/2018 03/07/2018 10/18/2017 09/18/2017 07/19/2017  Decreased Interest 0 0 0 0 0 0 0  Down, Depressed, Hopeless 0 0 0 0 0 0 3  PHQ - 2 Score 0 0 0 0 0 0 3  Altered sleeping - 0 - - - 0 -  Tired, decreased energy - 0 - - - 0 -  Change in appetite - 0 - - - 0 -  Feeling bad or failure about yourself  - 0 - - - 0 -  Trouble concentrating - 0 - - - 0 -  Moving slowly or fidgety/restless - 0 - - - 0 -  Suicidal thoughts - 0 - - - 0 -  PHQ-9 Score - 0 - - - 0 -  Difficult doing work/chores - Not difficult at all - - - - -  Some recent data might be hidden    Review of Systems  Constitutional: Negative.   HENT: Negative.   Eyes: Negative.   Respiratory: Negative.   Cardiovascular: Negative.   Endocrine: Negative.   Genitourinary: Negative.   Musculoskeletal: Negative.   Skin: Negative.   Allergic/Immunologic: Negative.   Neurological: Negative.   Hematological: Negative.   Psychiatric/Behavioral: Negative.   All other systems reviewed and are negative.      Objective:   Physical Exam Vitals signs and nursing note reviewed.  Constitutional:      Appearance: Normal appearance.  Neck:     Musculoskeletal: Normal  range of motion and neck supple.  Cardiovascular:     Rate and Rhythm: Normal rate and regular rhythm.     Pulses: Normal  pulses.     Heart sounds: Normal heart sounds.  Pulmonary:     Effort: Pulmonary effort is normal.     Breath sounds: Normal breath sounds.  Musculoskeletal:     Comments: Normal Muscle Bulk and Muscle Testing Reveals:  Upper Extremities: Full ROM and Muscle Strength 5/5  Lumbar Paraspinal Tenderness: L-3-L-5 Right Greater Trochanter Tenderness Lower Extremities: Full ROM and Muscle Strength 5/5 Arises from Table slowly Narrow Based Gait  Skin:    General: Skin is warm and dry.  Neurological:     Mental Status: She is alert and oriented to person, place, and time.  Psychiatric:        Mood and Affect: Mood normal.        Behavior: Behavior normal.           Assessment & Plan:  1.Lumbar Postlaminectomy/ Spinal Stenosis Lumbar Region/ Post-Op Pain/Low back pain with radiating symptoms in L3-4 distribution: continues to be limited by pain. Refilled:MSIR 15 mg one tablet twice a day as needed for moderate to sever pain#60andMorphine Sulfate ER 15 mg Q 8 hours.02/21/2019. We will continue the opioid monitoring program, this consists of regular clinic visits, examinations, urine drug screen, pill counts as well as use of [place] Controlled Substance Reporting System. Encouraged to Continue exercise regime.  2. Lumbar Radiculopathy: Resume Topamax:.Continue to Monitor09/08/2018 3. Chronic Right Knee Pain:No complaints today. Continue with Ice TherapyandVoltaren Gel. 02/21/2019 4. Muscle Spasm: Continuecurrent treatment: Tizanidine.09//08/2018 5. Lipoma: S/P Left Lipoma Excision of upper back By Dr. Brantley Stage on 01/03/2018. Surgery Following. Continue to Monitor. 02/21/2019. 6. Paresthesia of Skin: ContinueTopamax.02/21/2019 7. Adhesive Capsulitis of Right Shoulder: S/PU/S Cortisoneinjectionon 01/13/2020with some relief noted.. Continue to monitor. Continue HEP as Tolerated.02/21/2019  15 minutes of face to face patient care time was spent during this  visit. All questions were encouraged and answered.  F/U in 1 month

## 2019-03-11 ENCOUNTER — Other Ambulatory Visit: Payer: Self-pay | Admitting: Family Medicine

## 2019-03-11 NOTE — Telephone Encounter (Signed)
Requested medication (s) are due for refill today: yes  Requested medication (s) are on the active medication list: yes  Last refill:  02/06/2019  Future visit scheduled: no  Notes to clinic:  Review for refill Ordering provider and pcp are different    Requested Prescriptions  Pending Prescriptions Disp Refills   chlorthalidone (HYGROTON) 25 MG tablet [Pharmacy Med Name: CHLORTHALIDONE 25 MG TABLET] 45 tablet 3    Sig: TAKE 1/2 TABLET BY MOUTH EVERY DAY     Cardiovascular: Diuretics - Thiazide Failed - 03/11/2019  1:34 AM      Failed - Cr in normal range and within 360 days    Creat  Date Value Ref Range Status  05/10/2016 0.99 0.50 - 1.05 mg/dL Final    Comment:      For patients > or = 62 years of age: The upper reference limit for Creatinine is approximately 13% higher for people identified as African-American.      Creatinine, Ser  Date Value Ref Range Status  08/24/2018 1.04 (H) 0.57 - 1.00 mg/dL Final         Failed - Last BP in normal range    BP Readings from Last 1 Encounters:  02/21/19 (!) 158/91         Failed - Valid encounter within last 6 months    Recent Outpatient Visits          6 months ago Essential hypertension   [facility], Lilia Argue, MD   10 months ago Dizziness   [facility] at [facility], Arlie Solomons, MD   1 year ago Essential hypertension   [facility] at [facility], Gelene Mink, PA-C   1 year ago Pure hypercholesterolemia   [facility] at [facility], Renette Butters, MD   2 years ago Encounter for Medicare annual wellness exam   Primary Care at [facility], Renette Butters, MD             [place] in normal range and within 360 days    Calcium  Date Value Ref Range Status  08/24/2018 9.8 8.7 - 10.3 mg/dL Final   Calcium, Total  Date Value Ref Range Status  03/14/2013 9.4 8.5 - 10.1 mg/dL Final         Passed - K in normal range and within 360 days    Potassium  Date Value Ref Range Status  08/24/2018  4.3 3.5 - 5.2 mmol/L Final  03/14/2013 2.4 (LL) 3.5 - 5.1 mmol/L Final         Passed - Na in normal range and within 360 days    Sodium  Date Value Ref Range Status  08/24/2018 139 134 - 144 mmol/L Final  03/14/2013 134 (L) 136 - 145 mmol/L Final

## 2019-03-22 ENCOUNTER — Encounter: Payer: Self-pay | Admitting: Registered Nurse

## 2019-03-22 ENCOUNTER — Encounter: Payer: Medicare Other | Attending: Physical Medicine & Rehabilitation | Admitting: Registered Nurse

## 2019-03-22 ENCOUNTER — Other Ambulatory Visit: Payer: Self-pay

## 2019-03-22 VITALS — BP 142/88 | HR 64 | Temp 98.7°F | Resp 14 | Ht 64.0 in | Wt 213.0 lb

## 2019-03-22 DIAGNOSIS — Z5181 Encounter for therapeutic drug level monitoring: Secondary | ICD-10-CM

## 2019-03-22 DIAGNOSIS — M25561 Pain in right knee: Secondary | ICD-10-CM

## 2019-03-22 DIAGNOSIS — M48061 Spinal stenosis, lumbar region without neurogenic claudication: Secondary | ICD-10-CM | POA: Diagnosis not present

## 2019-03-22 DIAGNOSIS — G894 Chronic pain syndrome: Secondary | ICD-10-CM

## 2019-03-22 DIAGNOSIS — M961 Postlaminectomy syndrome, not elsewhere classified: Secondary | ICD-10-CM | POA: Insufficient documentation

## 2019-03-22 DIAGNOSIS — Z79891 Long term (current) use of opiate analgesic: Secondary | ICD-10-CM

## 2019-03-22 DIAGNOSIS — M5417 Radiculopathy, lumbosacral region: Secondary | ICD-10-CM | POA: Diagnosis not present

## 2019-03-22 DIAGNOSIS — M5414 Radiculopathy, thoracic region: Secondary | ICD-10-CM | POA: Diagnosis not present

## 2019-03-22 DIAGNOSIS — M5416 Radiculopathy, lumbar region: Secondary | ICD-10-CM

## 2019-03-22 MED ORDER — MORPHINE SULFATE 15 MG PO TABS: 15.0000 mg | ORAL_TABLET | Freq: Two times a day (BID) | ORAL | 0 refills | Status: DC | PRN

## 2019-03-22 MED ORDER — MORPHINE SULFATE ER 15 MG PO T12A: 1.0000 | EXTENDED_RELEASE_TABLET | Freq: Three times a day (TID) | ORAL | 0 refills | Status: DC

## 2019-03-22 NOTE — Progress Notes (Signed)
Subjective:    Patient ID: [name] [name], female    DOB: 1956/03/09, 63 y.o.   MRN: [id number]  HPI: [name] [name] is a 63 y.o. female who returns for follow up appointment for chronic pain and medication refill. She states her pain is located in her lower back radiating into her right lower extremity and right knee pain, Ms. Warne reports increase intensity of right knee pain over the weekend, she denies falling. We will order a X-ray, she verbalizes understanding. She rates her pain 9. Her current exercise regime is walking and performing stretching exercises.  Ms. Lindblom Morphine equivalent is 75.00  MME.  Oral Swab was Performed Today.    Pain Inventory Average Pain 8 Pain Right Now 9 My pain is constant, sharp and aching  In the last 24 hours, has pain interfered with the following? General activity 9 Relation with others 9 Enjoyment of life 9 What TIME of day is your pain at its worst? morning, night  Sleep (in general) Poor  Pain is worse with: bending, inactivity and standing Pain improves with: rest, heat/ice and medication Relief from Meds: 1  Mobility walk without assistance how many minutes can you walk? 5 ability to climb steps?  yes do you drive?  yes Do you have any goals in this area?  yes  Function not employed: date last employed . I need assistance with the following:  household duties Do you have any goals in this area?  yes  Neuro/Psych bladder control problems spasms  Prior Studies Any changes since last visit?  no  Physicians involved in your care    Family History  Problem Relation Age of Onset  . Heart disease Mother 95       CHF; CAD  . Hypertension Mother   . Heart failure Mother   . Diabetes Sister   . Lupus Sister   . Kidney failure Sister   . Hypertension Brother   . Hyperlipidemia Sister   . Hypertension Sister   . Colon cancer Neg Hx   . Rectal cancer Neg Hx    Social History   Socioeconomic History  .  Marital status: Widowed    Spouse name: Not on file  . Number of children: 3  . Years of education: Not on file  . Highest education level: Not on file  Occupational History  . 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 Financial resource strain: Not on file  . Food insecurity    Worry: Not on file    Inability: Not on file  . Transportation needs    Medical: Not on file    Non-medical: Not on file  Tobacco Use  . Smoking status: Former Smoker    Packs/day: 0.50    Years: 20.00    Pack years: 10.00    Types: Cigarettes    Quit date: 07/21/2008    Years since quitting: 10.6  . Smokeless tobacco: Never Used  Substance and Sexual Activity  . Alcohol use: No  . Drug use: No  . Sexual activity: Not on file  Lifestyle  . Physical activity    Days per week: Not on file    Minutes per session: Not on file  . Stress: Not on file  Relationships  . Social Herbalist on phone: Not on file    Gets together: Not on file    Attends religious service: Not on file  Active member of club or organization: Not on file    Attends meetings of clubs or organizations: Not on file    Relationship status: Not on file  Other Topics Concern  . Not on file  Social History Narrative   Marital status: widowed since 2002; not dating which is bad in 2018      Children: 3 children (45, 22, 86); 13 grandchildren; 1 gg      Employment: disability since 2009; retail      Lives: alone in [place] in apartment      Tobacco: quit in 2011      Alcohol: none      Drugs: none      Exercise: minimal in 2018      ADLs: independent with ADLs; drives      Seatbelt: 100%; no texting.   Past Surgical History:  Procedure Laterality Date  . ABDOMINAL HYSTERECTOMY     DUB; ovaries intact  . BREAST BIOPSY    . CARDIAC CATHETERIZATION  1990's   [name]  . COLON SURGERY  05/11/11   [facility] of adhesions Crohn's  . MASS EXCISION Left 01/03/2018   Procedure:  EXCISION LEFT UPPER BACK LIPOMA ERAS PATH;  Surgeon: Erroll Luna, MD;  Location: [facility];  Service: General;  Laterality: Left;  . REDUCTION MAMMAPLASTY    . SPINE SURGERY     Past Medical History:  Diagnosis Date  . Blood transfusion without reported diagnosis   . Crohn's disease ([place])   . Disorder of sacroiliac joint   . Hyperlipidemia   . Hypertension   . Ischemic colitis ([place])   . Lumbago   . Lumbar post-laminectomy syndrome   . Lumbosacral neuritis   . Lumbosacral radiculitis   . Sciatica    BP (!) 142/88   Pulse (!) 54   Temp 98.7 F (37.1 C) (Oral)   Resp 14   Ht 5\' 4"  (1.626 m)   Wt 213 lb (96.6 kg)   SpO2 98%   BMI 36.56 kg/m   Opioid Risk Score:   Fall Risk Score:  `1  Depression screen PHQ 2/9  Depression screen [facility] 2/9 09/27/2018 08/24/2018 05/08/2018 03/07/2018 10/18/2017 09/18/2017 07/19/2017  Decreased Interest 0 0 0 0 0 0 0  Down, Depressed, Hopeless 0 0 0 0 0 0 3  PHQ - 2 Score 0 0 0 0 0 0 3  Altered sleeping - 0 - - - 0 -  Tired, decreased energy - 0 - - - 0 -  Change in appetite - 0 - - - 0 -  Feeling bad or failure about yourself  - 0 - - - 0 -  Trouble concentrating - 0 - - - 0 -  Moving slowly or fidgety/restless - 0 - - - 0 -  Suicidal thoughts - 0 - - - 0 -  PHQ-9 Score - 0 - - - 0 -  Difficult doing work/chores - Not difficult at all - - - - -  Some recent data might be hidden     Review of Systems  Constitutional: Negative.   HENT: Negative.   Eyes: Negative.   Respiratory: Negative.   Cardiovascular: Negative.   Gastrointestinal: Negative.   Endocrine: Negative.   Genitourinary: Positive for urgency.  Musculoskeletal: Positive for arthralgias, back pain and gait problem.       Spasms   Skin: Negative.   Allergic/Immunologic: Negative.   All other systems reviewed and are negative.  Objective:   Physical Exam Vitals signs and nursing note reviewed.  Constitutional:      Appearance: Normal appearance.   Neck:     Musculoskeletal: Normal range of motion and neck supple.  Cardiovascular:     Rate and Rhythm: Normal rate and regular rhythm.     Pulses: Normal pulses.     Heart sounds: Normal heart sounds.  Pulmonary:     Effort: Pulmonary effort is normal.     Breath sounds: Normal breath sounds.  Musculoskeletal:     Comments: Normal Muscle Bulk and Muscle Testing Reveals:  Upper Extremities: Full ROM and Muscle Strength 5/5 , Lumbar Paraspinal Tenderness: L-4-L-5 Lower Extremities: Right: Decreased ROM and Muscle Strength 5/5 Right Lower Extremity Flexion Produces Pain into Right Patella Arises from Table slowly  Antalgic  Gait   Skin:    General: Skin is warm and dry.  Neurological:     Mental Status: She is alert and oriented to person, place, and time.  Psychiatric:        Mood and Affect: Mood normal.        Behavior: Behavior normal.           Assessment & Plan:  1.Lumbar Postlaminectomy/ Spinal Stenosis Lumbar Region/ Post-Op Pain/Low back pain with radiating symptoms in L3-4 distribution: continues to be limited by pain. Refilled:MSIR 15 mg one tablet twice a day as needed for moderate to sever pain#60andMorphine Sulfate ER 15 mg Q 8 hours.03/22/2019. We will continue the opioid monitoring program, this consists of regular clinic visits, examinations, urine drug screen, pill counts as well as use of [place] Controlled Substance Reporting System. Encouraged to Continue exercise regime.  2. Lumbar Radiculopathy:Continue Topamax:.Continue to Monitor10/07/2018 3.Acute  Right Knee Pain:RX: X-ray. Continue with Ice TherapyandVoltaren Gel. 03/22/2019 4. Muscle Spasm: Continuecurrent treatment: Tizanidine.10//07/2018 5. Lipoma: S/P Left Lipoma Excision of upper back By Dr. Brantley Stage on 01/03/2018. Surgery Following. Continue to Monitor. 03/22/2019. 6. Paresthesia of Skin: ContinueTopamax.03/22/2019 7. Adhesive Capsulitis of Right Shoulder: S/PU/S  Cortisoneinjectionon 01/13/2020with some relief noted.. Continue to monitor. Continue HEP as Tolerated.03/22/2019  31minutes of face to face patient care time was spent during this visit. All questions were encouraged and answered.  F/U in 1 month

## 2019-03-27 ENCOUNTER — Telehealth: Payer: Self-pay

## 2019-03-27 LAB — DRUG TOX MONITOR 1 W/CONF, ORAL FLD

## 2019-03-27 LAB — DRUG TOX ALC METAB W/CON, ORAL FLD: Alcohol Metabolite: NEGATIVE ng/mL (ref <25)

## 2019-03-27 NOTE — Telephone Encounter (Signed)
Patient called asking what the status was on her x-ray referral that was made recently for her knee.   Checked in her referrals, noted that it is still being processed through patients insurance and that it can take up to 2 weeks before it is approved.  Called to inform patient of findings, no answer, left message to return call.

## 2019-03-27 NOTE — Telephone Encounter (Signed)
Patient called back, informed her of status.

## 2019-04-01 ENCOUNTER — Telehealth: Payer: Self-pay | Admitting: *Deleted

## 2019-04-01 NOTE — Telephone Encounter (Signed)
Oral swab drug screen was consistent for prescribed medications.  ?

## 2019-04-07 ENCOUNTER — Other Ambulatory Visit: Payer: Self-pay | Admitting: Family Medicine

## 2019-04-08 NOTE — Telephone Encounter (Signed)
Requested medication (s) are due for refill today: yes  Requested medication (s) are on the active medication list: yes  Last refill:  01/09/2019  Future visit scheduled: no  Notes to clinic:  Overdue for follow   Requested Prescriptions  Pending Prescriptions Disp Refills   potassium chloride (KLOR-CON) 20 MEQ packet [Pharmacy Med Name: POTASSIUM CL 20 MEQ PACKET] 90 packet 1    Sig: USE 1 PACKET AS INSTRUCTED ONCE A DAY     Endocrinology:  Minerals - Potassium Supplementation Failed - 04/07/2019  9:43 AM      Failed - Cr in normal range and within 360 days    Creat  Date Value Ref Range Status  05/10/2016 0.99 0.50 - 1.05 mg/dL Final    Comment:      For patients > or = 62 years of age: The upper reference limit for Creatinine is approximately 13% higher for people identified as African-American.      Creatinine, Ser  Date Value Ref Range Status  08/24/2018 1.04 (H) 0.57 - 1.00 mg/dL Final         Passed - K in normal range and within 360 days    Potassium  Date Value Ref Range Status  08/24/2018 4.3 3.5 - 5.2 mmol/L Final  03/14/2013 2.4 (LL) 3.5 - 5.1 mmol/L Final         Passed - Valid encounter within last 12 months    Recent Outpatient Visits          7 months ago Essential hypertension   [facility], Lilia Argue, MD   11 months ago Dizziness   [facility], Arlie Solomons, MD   1 year ago Essential hypertension   [facility] at [facility], Gelene Mink, PA-C   2 years ago Pure hypercholesterolemia   [facility] at [facility], Renette Butters, MD   2 years ago Encounter for Medicare annual wellness exam   Primary Care at [facility], Renette Butters, MD      Future Appointments            In 1 week Lyndee Hensen, MD [facility], [facility]

## 2019-04-15 ENCOUNTER — Ambulatory Visit
Admission: RE | Admit: 2019-04-15 | Discharge: 2019-04-15 | Disposition: A | Discharge status: Home / Self Care | Payer: Medicare Other | Source: Ambulatory Visit | Attending: Registered Nurse | Admitting: Registered Nurse

## 2019-04-15 ENCOUNTER — Ambulatory Visit (INDEPENDENT_AMBULATORY_CARE_PROVIDER_SITE_OTHER): Payer: Medicare Other | Admitting: Family Medicine

## 2019-04-15 ENCOUNTER — Encounter: Payer: Self-pay | Admitting: Family Medicine

## 2019-04-15 ENCOUNTER — Other Ambulatory Visit: Payer: Self-pay

## 2019-04-15 VITALS — BP 132/78 | HR 74 | Ht 64.0 in | Wt 219.0 lb

## 2019-04-15 DIAGNOSIS — J309 Allergic rhinitis, unspecified: Secondary | ICD-10-CM | POA: Diagnosis not present

## 2019-04-15 DIAGNOSIS — Z23 Encounter for immunization: Secondary | ICD-10-CM | POA: Diagnosis not present

## 2019-04-15 DIAGNOSIS — M1711 Unilateral primary osteoarthritis, right knee: Secondary | ICD-10-CM | POA: Diagnosis not present

## 2019-04-15 DIAGNOSIS — G894 Chronic pain syndrome: Secondary | ICD-10-CM

## 2019-04-15 DIAGNOSIS — E7800 Pure hypercholesterolemia, unspecified: Secondary | ICD-10-CM

## 2019-04-15 DIAGNOSIS — R413 Other amnesia: Secondary | ICD-10-CM

## 2019-04-15 DIAGNOSIS — F5101 Primary insomnia: Secondary | ICD-10-CM | POA: Diagnosis not present

## 2019-04-15 DIAGNOSIS — J301 Allergic rhinitis due to pollen: Secondary | ICD-10-CM | POA: Diagnosis not present

## 2019-04-15 DIAGNOSIS — R7303 Prediabetes: Secondary | ICD-10-CM

## 2019-04-15 DIAGNOSIS — I10 Essential (primary) hypertension: Secondary | ICD-10-CM

## 2019-04-15 DIAGNOSIS — Z87898 Personal history of other specified conditions: Secondary | ICD-10-CM

## 2019-04-15 DIAGNOSIS — G8929 Other chronic pain: Secondary | ICD-10-CM | POA: Insufficient documentation

## 2019-04-15 DIAGNOSIS — E876 Hypokalemia: Secondary | ICD-10-CM

## 2019-04-15 LAB — POCT GLYCOSYLATED HEMOGLOBIN (HGB A1C): Hemoglobin A1C: 5.8 % — AB (ref 4.0–5.6)

## 2019-04-15 MED ORDER — FLUTICASONE PROPIONATE 50 MCG/ACT NA SUSP: 2.0000 | Freq: Every day | NASAL | 0 refills | Status: DC

## 2019-04-15 MED ORDER — POTASSIUM CHLORIDE 20 MEQ PO PACK: PACK | ORAL | 1 refills | Status: DC

## 2019-04-15 NOTE — Assessment & Plan Note (Addendum)
BP today at goal. Continue chlorthalidone 12.5 mg daily.  Obtain BMP, lipid panel and A1c today.  Continue taking potassium.

## 2019-04-15 NOTE — Progress Notes (Signed)
New Patient Office Visit  Subjective:  Patient ID: [name] [name], female    DOB: 1956-03-16  Age: 63 y.o. MRN: [id number]  CC:  Chief Complaint  Patient presents with  . Establish Care  . Hypertension    HPI [name] [name] presents to establish care. PMHx includes HTN, HLD, Chrons dz, chronic back pain and anemia.   HTN Takes chlorthalidone. Denies missing doses of antihypertensive medications. Denies chest pain, palpitations, exertional dyspnea, headaches and vision changes.  Endorses swelling in hands and feet.   Allergic Rhinitis  Takes Flonase.  Denies nasal congestion, rhinorrhea, cough, fever, ear pain, itchy watery eyes.  Hypokalemia  Takes 20 mEq of potassium daily.  Reports swelling in hands and feet.   Memory issues Patient reports she is often unable to retrieve words the desired word in conversation.  Denies headache, dizziness, confusion, syncope, weakness and sleep disturbance.  Patient is wondering if she has signs of Alzheimer's.  Chronic Pain  Follow with pain clinic for ongoing neck and back pain.  Has current right knee pain. Seems pain clinic provider later this week. Has XR of right knee scheduled for later today.   Past medical, surgical and social history reviewed and updated as appropriate. Former smoker (20-25 pk years). Denies illicit drug and alcohol use.    Past Medical History:  Diagnosis Date  . Anemia   . Blood transfusion without reported diagnosis   . Crohn's disease ([place])   . Disorder of sacroiliac joint   . Hyperlipidemia   . Hypertension   . Ischemic colitis ([place])   . Lumbago   . Lumbar post-laminectomy syndrome   . Lumbosacral neuritis   . Lumbosacral radiculitis   . Sciatica     Past Surgical History:  Procedure Laterality Date  . ABDOMINAL HYSTERECTOMY  1982   DUB; ovaries intact  . bladder surg  1992   bladder retraction  . BREAST BIOPSY    . BREAST REDUCTION SURGERY  1984  . CARDIAC CATHETERIZATION  1990's   [name]  . COLON SURGERY  05/11/11   [facility] of adhesions Crohn's  . MASS EXCISION Left 01/03/2018   Procedure: EXCISION LEFT UPPER BACK LIPOMA ERAS PATH;  Surgeon: Erroll Luna, MD;  Location: [facility];  Service: General;  Laterality: Left;  . REDUCTION MAMMAPLASTY    . SPINE SURGERY     5 total (1 upper, 4 lumbar)    Family History  Problem Relation Age of Onset  . Heart disease Mother 36       CHF; CAD  . Hypertension Mother   . Heart failure Mother   . Diabetes Sister   . Lupus Sister   . Kidney failure Sister   . Hypertension Brother   . Hyperlipidemia Sister   . Hypertension Sister   . Colon cancer Neg Hx   . Rectal cancer Neg Hx     Social History   Socioeconomic History  . Marital status: Widowed    Spouse name: Not on file  . Number of children: 3  . Years of education: Not on file  . Highest education level: Not on file  Occupational History  . 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 Financial resource strain: Not on file  . Food insecurity    Worry: Not on file    Inability: Not on file  . Transportation needs    Medical: Not on file    Non-medical:  Not on file  Tobacco Use  . Smoking status: Former Smoker    Packs/day: 0.50    Years: 20.00    Pack years: 10.00    Types: Cigarettes    Quit date: 07/21/2008    Years since quitting: 10.7  . Smokeless tobacco: Never Used  Substance and Sexual Activity  . Alcohol use: No  . Drug use: No  . Sexual activity: Not on file  Lifestyle  . Physical activity    Days per week: Not on file    Minutes per session: Not on file  . Stress: Not on file  Relationships  . Social Herbalist on phone: Not on file    Gets together: Not on file    Attends religious service: Not on file    Active member of club or organization: Not on file    Attends meetings of clubs or organizations: Not on file    Relationship status: Not on file  . Intimate  partner violence    Fear of current or ex partner: Not on file    Emotionally abused: Not on file    Physically abused: Not on file    Forced sexual activity: Not on file  Other Topics Concern  . Not on file  Social History Narrative   Marital status: widowed since 2002; not dating which is bad in 2018      Children: 3 children (45, 83, 28); 13 grandchildren; 1 gg      Employment: disability since 2009; retail      Lives: alone in [place] in apartment      Tobacco: quit in 2011      Alcohol: none      Drugs: none      Exercise: minimal in 2018      ADLs: independent with ADLs; drives      Seatbelt: 100%; no texting.     Review of Systems  Constitutional: Negative for fever.  HENT: Negative for congestion, rhinorrhea and sore throat.   Eyes: Negative for discharge and itching.  Respiratory: Negative for cough and shortness of breath.   Cardiovascular: Negative for chest pain and leg swelling.  Gastrointestinal: Positive for nausea. Negative for abdominal pain, constipation and vomiting.  Genitourinary: Negative for dysuria.  Musculoskeletal: Positive for arthralgias, back pain and neck pain.  Skin: Negative for rash.  Neurological: Negative for dizziness, syncope and headaches.  Psychiatric/Behavioral: Negative for confusion and sleep disturbance.    Objective:   Today's Vitals: BP 132/78   Pulse 74   Ht 5\' 4"  (1.626 m)   Wt 219 lb (99.3 kg)   SpO2 98%   BMI 37.59 kg/m   Physical Exam Constitutional:      General: She is not in acute distress.    Appearance: Normal appearance. She is not ill-appearing.  HENT:     Head: Normocephalic and atraumatic.     Right Ear: Tympanic membrane and ear canal normal. There is no impacted cerumen.     Left Ear: Tympanic membrane and ear canal normal. There is no impacted cerumen.     Nose: Nose normal. No congestion or rhinorrhea.     Mouth/Throat:     Mouth: Mucous membranes are moist.     Pharynx: Oropharynx is clear. No  oropharyngeal exudate or posterior oropharyngeal erythema.  Eyes:     Extraocular Movements: Extraocular movements intact.     Conjunctiva/sclera: Conjunctivae normal.     Pupils: Pupils are equal, round, and reactive to  light.  Neck:     Musculoskeletal: Normal range of motion and neck supple. No muscular tenderness.  Cardiovascular:     Rate and Rhythm: Normal rate and regular rhythm.     Pulses: Normal pulses.     Heart sounds: Normal heart sounds. No murmur.  Pulmonary:     Effort: Pulmonary effort is normal. No respiratory distress.     Breath sounds: Normal breath sounds.  Abdominal:     General: Bowel sounds are normal.     Palpations: There is no mass.     Tenderness: There is no abdominal tenderness. There is no right CVA tenderness, left CVA tenderness, guarding or rebound.     Hernia: A hernia is present.  Musculoskeletal:        General: No deformity or signs of injury.     Comments: Right knee lateral tenderness and mild edema, decreased ROM due to pain, L-spine tender to palpation  Lymphadenopathy:     Cervical: No cervical adenopathy.  Skin:    General: Skin is warm and dry.     Capillary Refill: Capillary refill takes less than 2 seconds.     Findings: No rash.     Comments: Well healed linear midline L-spine surgical scar and horizontal surgical scar upper left back  Neurological:     Mental Status: She is alert and oriented to person, place, and time.     Sensory: No sensory deficit.     Coordination: Coordination normal.     Comments:  cranial nerves 2-12 grossly intact  Psychiatric:        Mood and Affect: Mood normal.        Behavior: Behavior normal.        Thought Content: Thought content normal.        Judgment: Judgment normal.     Assessment & Plan:   Problem List Items Addressed This Visit      Cardiovascular and Mediastinum   Essential hypertension - Primary    BP today at goal. Continue chlorthalidone 12.5 mg daily.  Obtain BMP, lipid panel  and A1c today.  Continue taking potassium.      Relevant Orders   Basic Metabolic Panel   Lipid Panel   HgB A1c (Completed)     Respiratory   Allergic rhinitis    Refilled Flonase.      Relevant Medications   fluticasone (FLONASE) 50 MCG/ACT nasal spray     Other   Hypokalemia    Refilled potassium 20 mg.      Relevant Medications   potassium chloride (KLOR-CON) 20 MEQ packet   Primary insomnia   Chronic pain    Continue to follow with pain clinic for pain management.       Memory difficulties    Mini-Mental status exam performed today.  Score 28 out of 30.  Results discussed with patient.  We will continue to assess patient's memory on future visits.      Pre-diabetes    A1C 5.8 today. Assess dietary history at next visit. Continue to counsel on diet and exercise. BMP pending.        Other Visit Diagnoses    Need for immunization against influenza       Relevant Orders   Flu Vaccine QUAD 36+ mos IM (Completed)   Hx of abnormal mammogram       Relevant Orders   MM Digital Screening      Outpatient Encounter Medications as of 04/15/2019  Medication Sig  .  chlorthalidone (HYGROTON) 25 MG tablet TAKE 1/2 TABLET BY MOUTH EVERY DAY  . diclofenac sodium (VOLTAREN) 1 % GEL Apply 2 g topically 4 (four) times daily.  . fluticasone (FLONASE) 50 MCG/ACT nasal spray Place 2 sprays into both nostrils daily.  Marland Kitchen morphine (MSIR) 15 MG tablet Take 1 tablet (15 mg total) by mouth 2 (two) times daily as needed for severe pain.  Marland Kitchen Morphine Sulfate ER 15 MG T12A Take 1 tablet by mouth every 8 (eight) hours. Morphabond  no longer covered by her insurance.  . naloxone (NARCAN) nasal spray 4 mg/0.1 mL Lethargic or respiratory depression after opioid use  . potassium chloride (KLOR-CON) 20 MEQ packet USE 1 PACKET AS INSTRUCTED ONCE A DAY  . tiZANidine (ZANAFLEX) 2 MG tablet Take 1 tablet (2 mg total) by mouth 2 (two) times daily as needed for muscle spasms.  Marland Kitchen topiramate (TOPAMAX) 25  MG tablet TAKE 1 TABLET BY MOUTH EVERYDAY AT BEDTIME  . [DISCONTINUED] fluticasone (FLONASE) 50 MCG/ACT nasal spray Place 2 sprays into both nostrils daily.  . [DISCONTINUED] potassium chloride (KLOR-CON) 20 MEQ packet USE 1 PACKET AS INSTRUCTED ONCE A DAY   No facility-administered encounter medications on file as of 04/15/2019.     Follow-up: Return in about 6 months (around 10/14/2019).   Lyndee Hensen, DO PGY-1, [facility] 04/15/2019 8:43 AM

## 2019-04-15 NOTE — Assessment & Plan Note (Signed)
Continue to follow with pain clinic for pain management.

## 2019-04-15 NOTE — Patient Instructions (Signed)
It was great seeing you today! - Stop by the pharmacy and pick up your medicine.   Please check-out at the front desk before leaving the clinic. I'd like to see you back 3 months but if you need to be seen earlier than that for any new issues we're happy to fit you in, just give [place] a call!  We are checking some labs today. If results require attention, either myself or my nurse will get in touch with you. If everything is normal, you will get a letter in the mail or a message in My Chart. Please give [place] a call if you do not hear from [place] after 2 weeks.  Please bring all of your medications with you to each visit.    Sign up for My Chart to have easy access to your labs results, and communication with your primary care physician.  Feel free to call with any questions or concerns at any time, at [phone].   Take care,  Dr. Rushie [facility]

## 2019-04-15 NOTE — Assessment & Plan Note (Signed)
Mini-Mental status exam performed today.  Score 28 out of 30.  Results discussed with patient.  We will continue to assess patient's memory on future visits.

## 2019-04-15 NOTE — Assessment & Plan Note (Signed)
A1C 5.8 today. Assess dietary history at next visit. Continue to counsel on diet and exercise. BMP pending.

## 2019-04-15 NOTE — Assessment & Plan Note (Signed)
Refilled potassium 20 mg.

## 2019-04-15 NOTE — Assessment & Plan Note (Signed)
Refilled Flonase

## 2019-04-16 LAB — BASIC METABOLIC PANEL
BUN/Creatinine Ratio: 11 — ABNORMAL LOW (ref 12–28)
BUN: 11 mg/dL (ref 8–27)
CO2: 27 mmol/L (ref 20–29)
Calcium: 9.6 mg/dL (ref 8.7–10.3)
Chloride: 104 mmol/L (ref 96–106)
Creatinine, Ser: 1.01 mg/dL — ABNORMAL HIGH (ref 0.57–1.00)
GFR calc Af Amer: 69 mL/min/{1.73_m2} (ref >59)
GFR calc non Af Amer: 60 mL/min/{1.73_m2} (ref >59)
Glucose: 125 mg/dL — ABNORMAL HIGH (ref 65–99)
Potassium: 4.3 mmol/L (ref 3.5–5.2)
Sodium: 144 mmol/L (ref 134–144)

## 2019-04-16 LAB — LIPID PANEL
Chol/HDL Ratio: 5.1 ratio — ABNORMAL HIGH (ref 0.0–4.4)
Cholesterol, Total: 248 mg/dL — ABNORMAL HIGH (ref 100–199)
HDL: 49 mg/dL (ref >39)
LDL Chol Calc (NIH): 175 mg/dL — ABNORMAL HIGH (ref 0–99)
Triglycerides: 135 mg/dL (ref 0–149)
VLDL Cholesterol Cal: 24 mg/dL (ref 5–40)

## 2019-04-18 ENCOUNTER — Encounter: Payer: Self-pay | Admitting: Registered Nurse

## 2019-04-18 ENCOUNTER — Other Ambulatory Visit: Payer: Self-pay

## 2019-04-18 ENCOUNTER — Encounter (HOSPITAL_BASED_OUTPATIENT_CLINIC_OR_DEPARTMENT_OTHER): Payer: Medicare Other | Admitting: Registered Nurse

## 2019-04-18 VITALS — BP 156/80 | HR 59 | Temp 97.7°F | Ht 64.0 in | Wt 219.8 lb

## 2019-04-18 DIAGNOSIS — M62838 Other muscle spasm: Secondary | ICD-10-CM

## 2019-04-18 DIAGNOSIS — G894 Chronic pain syndrome: Secondary | ICD-10-CM

## 2019-04-18 DIAGNOSIS — M961 Postlaminectomy syndrome, not elsewhere classified: Secondary | ICD-10-CM | POA: Diagnosis not present

## 2019-04-18 DIAGNOSIS — Z79891 Long term (current) use of opiate analgesic: Secondary | ICD-10-CM

## 2019-04-18 DIAGNOSIS — M5417 Radiculopathy, lumbosacral region: Secondary | ICD-10-CM | POA: Diagnosis not present

## 2019-04-18 DIAGNOSIS — M5416 Radiculopathy, lumbar region: Secondary | ICD-10-CM | POA: Diagnosis not present

## 2019-04-18 DIAGNOSIS — M48061 Spinal stenosis, lumbar region without neurogenic claudication: Secondary | ICD-10-CM

## 2019-04-18 DIAGNOSIS — Z5181 Encounter for therapeutic drug level monitoring: Secondary | ICD-10-CM

## 2019-04-18 DIAGNOSIS — M5414 Radiculopathy, thoracic region: Secondary | ICD-10-CM | POA: Diagnosis not present

## 2019-04-18 MED ORDER — MORPHINE SULFATE 15 MG PO TABS: 15.0000 mg | ORAL_TABLET | Freq: Two times a day (BID) | ORAL | 0 refills | Status: DC | PRN

## 2019-04-18 MED ORDER — ATORVASTATIN CALCIUM 20 MG PO TABS: 20.0000 mg | ORAL_TABLET | Freq: Every day | ORAL | 3 refills | Status: DC

## 2019-04-18 MED ORDER — TOPIRAMATE 25 MG PO TABS: ORAL_TABLET | ORAL | 2 refills | Status: DC

## 2019-04-18 MED ORDER — MORPHINE SULFATE ER 15 MG PO T12A: 1.0000 | EXTENDED_RELEASE_TABLET | Freq: Three times a day (TID) | ORAL | 0 refills | Status: DC

## 2019-04-18 NOTE — Progress Notes (Signed)
Left voicemail for patient to call or check MyChart regarding elevated cholesterol. Sent Lipitor 20 mg to pt's pharmacy.

## 2019-04-18 NOTE — Addendum Note (Signed)
Addended by: Lyndee Hensen D on: 04/18/2019 09:11 AM   Modules accepted: Orders

## 2019-04-18 NOTE — Progress Notes (Signed)
Subjective:    Patient ID: [name] [name], female    DOB: 04-30-1956, 63 y.o.   MRN: [id number]  HPI: [name] [name] is a 63 y.o. female who returns for follow up appointment for chronic pain and medication refill. She states her pain is located in her lower back radiating into her right lower extremity and right knee pain, She rates her pain 9. Her current exercise regime is walking and performing stretching exercises.  [name] Morphine equivalent is 75.00 MME.  Oral Swab was performed on 03/22/2019, it was consistent.   [name] arrived bradycardic apical pulse checked, she was instructed to F/U with her PCP and to keep blood pressure and pulse log, she verbalizes understanding. [name] was seen by her PCP on 04/15/2019, note and vitals was reviewed. Her pulse was 74.   Pain Inventory Average Pain 9 Pain Right Now 9 My pain is constant, sharp and aching  In the last 24 hours, has pain interfered with the following? General activity 9 Relation with others 10 Enjoyment of life 10 What TIME of day is your pain at its worst? all Sleep (in general) Poor  Pain is worse with: walking, sitting, inactivity and standing Pain improves with: rest, heat/ice and medication Relief from Meds: 1  Mobility how many minutes can you walk? 5 ability to climb steps?  yes do you drive?  yes  Function disabled: date disabled 5/09 I need assistance with the following:  household duties and shopping  Neuro/Psych weakness spasms  Prior Studies x-rays  Physicians involved in your care Primary care .   Family History  Problem Relation Age of Onset  . Heart disease Mother 88       CHF; CAD  . Hypertension Mother   . Heart failure Mother   . Diabetes Sister   . Lupus Sister   . Kidney failure Sister   . Hypertension Brother   . Hyperlipidemia Sister   . Hypertension Sister   . Colon cancer Neg Hx   . Rectal cancer Neg Hx    Social History   Socioeconomic History   . Marital status: Widowed    Spouse name: Not on file  . Number of children: 3  . Years of education: Not on file  . Highest education level: Not on file  Occupational History  . 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 Financial resource strain: Not on file  . Food insecurity    Worry: Not on file    Inability: Not on file  . Transportation needs    Medical: Not on file    Non-medical: Not on file  Tobacco Use  . Smoking status: Former Smoker    Packs/day: 0.50    Years: 20.00    Pack years: 10.00    Types: Cigarettes    Quit date: 07/21/2008    Years since quitting: 10.7  . Smokeless tobacco: Never Used  Substance and Sexual Activity  . Alcohol use: No  . Drug use: No  . Sexual activity: Not on file  Lifestyle  . Physical activity    Days per week: Not on file    Minutes per session: Not on file  . Stress: Not on file  Relationships  . Social Herbalist on phone: Not on file    Gets together: Not on file    Attends religious service: Not on file    Active member of club  or organization: Not on file    Attends meetings of clubs or organizations: Not on file    Relationship status: Not on file  Other Topics Concern  . Not on file  Social History Narrative   Marital status: widowed since 2002; not dating which is bad in 2018      Children: 3 children (45, 17, 68); 13 grandchildren; 1 gg      Employment: disability since 2009; retail      Lives: alone in [place] in apartment      Tobacco: quit in 2011      Alcohol: none      Drugs: none      Exercise: minimal in 2018      ADLs: independent with ADLs; drives      Seatbelt: 100%; no texting.   Past Surgical History:  Procedure Laterality Date  . ABDOMINAL HYSTERECTOMY  1982   DUB; ovaries intact  . bladder surg  1992   bladder retraction  . BREAST BIOPSY    . BREAST REDUCTION SURGERY  1984  . CARDIAC CATHETERIZATION  1990's   [name]  . COLON SURGERY   05/11/11   [facility] Lysis of adhesions Crohn's  . MASS EXCISION Left 01/03/2018   Procedure: EXCISION LEFT UPPER BACK LIPOMA ERAS PATH;  Surgeon: Erroll Luna, MD;  Location: [place];  Service: General;  Laterality: Left;  . REDUCTION MAMMAPLASTY    . SPINE SURGERY     5 total (1 upper, 4 lumbar)   Past Medical History:  Diagnosis Date  . Anemia   . Blood transfusion without reported diagnosis   . Crohn's disease ([place])   . Disorder of sacroiliac joint   . Hyperlipidemia   . Hypertension   . Ischemic colitis ([place])   . Lumbago   . Lumbar post-laminectomy syndrome   . Lumbosacral neuritis   . Lumbosacral radiculitis   . Sciatica    BP (!) 156/80   Pulse (!) 49   Temp 97.7 F (36.5 C)   Ht 5\' 4"  (1.626 m)   Wt 219 lb 12.8 oz (99.7 kg)   SpO2 95%   BMI 37.73 kg/m   Opioid Risk Score:   Fall Risk Score:  `1  Depression screen PHQ 2/9  Depression screen [facility] 2/9 04/18/2019 04/15/2019 09/27/2018 08/24/2018 05/08/2018 03/07/2018 10/18/2017  Decreased Interest 0 0 0 0 0 0 0  Down, Depressed, Hopeless 0 0 0 0 0 0 0  PHQ - 2 Score 0 0 0 0 0 0 0  Altered sleeping - - - 0 - - -  Tired, decreased energy - - - 0 - - -  Change in appetite - - - 0 - - -  Feeling bad or failure about yourself  - - - 0 - - -  Trouble concentrating - - - 0 - - -  Moving slowly or fidgety/restless - - - 0 - - -  Suicidal thoughts - - - 0 - - -  PHQ-9 Score - - - 0 - - -  Difficult doing work/chores - - - Not difficult at all - - -  Some recent data might be hidden    Review of Systems  Constitutional: Positive for unexpected weight change.  Gastrointestinal: Positive for nausea.  Neurological: Positive for weakness.  All other systems reviewed and are negative.      Objective:   Physical Exam Vitals signs and nursing note reviewed.  Constitutional:      Appearance: Normal  appearance.  Neck:     Musculoskeletal: Normal range of motion and neck supple.  Cardiovascular:     Rate  and Rhythm: Normal rate and regular rhythm.     Pulses: Normal pulses.     Heart sounds: Normal heart sounds.  Pulmonary:     Effort: Pulmonary effort is normal.     Breath sounds: Normal breath sounds.  Musculoskeletal:     Comments: Normal Muscle Bulk and Muscle Testing Reveals:  Upper Extremities: Full ROM and Muscle Strength 5/5  Lumbar Paraspinal Tenderness: L-3-L-5 Lower Extremities: Right: Decreased ROM and Muscle Strength 5/5 Right Lower Extremity Flexion Produces Pain into Patella Left: Full ROM and Muscle Strength 5/5 Arises from chair slowly Antalgic Gait   Skin:    General: Skin is warm and dry.  Neurological:     Mental Status: She is alert and oriented to person, place, and time.  Psychiatric:        Mood and Affect: Mood normal.        Behavior: Behavior normal.           Assessment & Plan:  1.Lumbar Postlaminectomy/ Spinal Stenosis Lumbar Region/ Post-Op Pain/Low back pain with radiating symptoms in L3-4 distribution: continues to be limited by pain. Refilled:MSIR 15 mg one tablet twice a day as needed for moderate to sever pain#60andMorphine Sulfate ER15 mg Q 8 hours.04/18/2019. We will continue the opioid monitoring program, this consists of regular clinic visits, examinations, urine drug screen, pill counts as well as use of [place] Controlled Substance Reporting System. Encouraged to Continue exercise regime.  2. Lumbar Radiculopathy:Continue Topamax:.Continue to Monitor10/29/2020 3.Chronic  Right Knee Pain:X-ray results reviewed, she verbalize understanding. Continue with Ice TherapyandVoltaren Gel. 04/18/2019 4. Muscle Spasm: Continuecurrent treatment: Tizanidine.10//29/2020 5. Lipoma: S/P Left Lipoma Excision of upper back By Dr. Brantley Stage on 01/03/2018. Surgery Following. Continue to Monitor. 04/18/2019. 6. Paresthesia of Skin: ContinueTopamax.04/18/2019 7. Adhesive Capsulitis of Right Shoulder: S/PU/S Cortisoneinjectionon  01/13/2020with some relief noted.Continue to monitor. Continue HEP as Tolerated.04/18/2019  77minutes of face to face patient care time was spent during this visit. All questions were encouraged and answered.  F/U in 1 month

## 2019-04-30 ENCOUNTER — Other Ambulatory Visit: Payer: Self-pay | Admitting: Family Medicine

## 2019-04-30 DIAGNOSIS — Z1231 Encounter for screening mammogram for malignant neoplasm of breast: Secondary | ICD-10-CM

## 2019-05-05 ENCOUNTER — Other Ambulatory Visit: Payer: Self-pay | Admitting: Family Medicine

## 2019-05-05 DIAGNOSIS — J301 Allergic rhinitis due to pollen: Secondary | ICD-10-CM

## 2019-05-20 ENCOUNTER — Encounter: Payer: Self-pay | Admitting: Registered Nurse

## 2019-05-20 ENCOUNTER — Encounter: Payer: Medicare Other | Attending: Physical Medicine & Rehabilitation | Admitting: Registered Nurse

## 2019-05-20 ENCOUNTER — Other Ambulatory Visit: Payer: Self-pay

## 2019-05-20 VITALS — BP 114/74 | HR 65 | Temp 97.5°F | Ht 64.0 in | Wt 213.0 lb

## 2019-05-20 DIAGNOSIS — Z5181 Encounter for therapeutic drug level monitoring: Secondary | ICD-10-CM | POA: Diagnosis not present

## 2019-05-20 DIAGNOSIS — M5416 Radiculopathy, lumbar region: Secondary | ICD-10-CM

## 2019-05-20 DIAGNOSIS — M961 Postlaminectomy syndrome, not elsewhere classified: Secondary | ICD-10-CM | POA: Insufficient documentation

## 2019-05-20 DIAGNOSIS — G894 Chronic pain syndrome: Secondary | ICD-10-CM

## 2019-05-20 DIAGNOSIS — M5417 Radiculopathy, lumbosacral region: Secondary | ICD-10-CM | POA: Diagnosis not present

## 2019-05-20 DIAGNOSIS — M62838 Other muscle spasm: Secondary | ICD-10-CM

## 2019-05-20 DIAGNOSIS — M48061 Spinal stenosis, lumbar region without neurogenic claudication: Secondary | ICD-10-CM | POA: Diagnosis not present

## 2019-05-20 DIAGNOSIS — M5414 Radiculopathy, thoracic region: Secondary | ICD-10-CM | POA: Insufficient documentation

## 2019-05-20 DIAGNOSIS — Z79891 Long term (current) use of opiate analgesic: Secondary | ICD-10-CM | POA: Diagnosis not present

## 2019-05-20 MED ORDER — MORPHINE SULFATE ER 15 MG PO T12A: 1.0000 | EXTENDED_RELEASE_TABLET | Freq: Three times a day (TID) | ORAL | 0 refills | Status: DC

## 2019-05-20 MED ORDER — MORPHINE SULFATE 15 MG PO TABS: 15.0000 mg | ORAL_TABLET | Freq: Two times a day (BID) | ORAL | 0 refills | Status: DC | PRN

## 2019-05-20 NOTE — Progress Notes (Signed)
Subjective:    Patient ID: [name] [name], female    DOB: Jul 16, 1955, 63 y.o.   MRN: [id number]  HPI: [name] [name] is a 63 y.o. female who returns for follow up appointment for chronic pain and medication refill. She states her pain is located in her lower back and reports she's having increase intensity and frequency of deep buttock pain, also reports she's alternating with heat and ice therapy and performing stretching exercises, with no relief in her pain. She was diagnosed in the past with sacroiliac dysfunction, we discussed injection at this time she refuses. Will discuss the above  with Dr. Letta Pate and give her a call, she verbalizes understanding.  She rates her pain 9. Her current exercise regime is walking and performing stretching exercises.  [name] [name] Morphine equivalent is 75.00  MME.  UDS ordered today.   Pain Inventory Average Pain 9 Pain Right Now 9 My pain is sharp, burning and stabbing  In the last 24 hours, has pain interfered with the following? General activity 10 Relation with others 10 Enjoyment of life 10 What TIME of day is your pain at its worst? all Sleep (in general) Poor  Pain is worse with: walking, bending, sitting and standing Pain improves with: medication Relief from Meds: 1  Mobility ability to climb steps?  yes do you drive?  yes  Function disabled: date disabled 2009 I need assistance with the following:  meal prep and household duties  Neuro/Psych bladder control problems trouble walking  Prior Studies Any changes since last visit?  no  Physicians involved in your care Any changes since last visit?  no   Family History  Problem Relation Age of Onset  . Heart disease Mother 18       CHF; CAD  . Hypertension Mother   . Heart failure Mother   . Diabetes Sister   . Lupus Sister   . Kidney failure Sister   . Hypertension Brother   . Hyperlipidemia Sister   . Hypertension Sister   . Colon cancer Neg Hx   . Rectal  cancer Neg Hx    Social History   Socioeconomic History  . Marital status: Widowed    Spouse name: Not on file  . Number of children: 3  . Years of education: Not on file  . Highest education level: Not on file  Occupational History  . 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 Financial resource strain: Not on file  . Food insecurity    Worry: Not on file    Inability: Not on file  . Transportation needs    Medical: Not on file    Non-medical: Not on file  Tobacco Use  . Smoking status: Former Smoker    Packs/day: 0.50    Years: 20.00    Pack years: 10.00    Types: Cigarettes    Quit date: 07/21/2008    Years since quitting: 10.8  . Smokeless tobacco: Never Used  Substance and Sexual Activity  . Alcohol use: No  . Drug use: No  . Sexual activity: Not on file  Lifestyle  . Physical activity    Days per week: Not on file    Minutes per session: Not on file  . Stress: Not on file  Relationships  . Social Herbalist on phone: Not on file    Gets together: Not on file    Attends religious service: Not on  file    Active member of club or organization: Not on file    Attends meetings of clubs or organizations: Not on file    Relationship status: Not on file  Other Topics Concern  . Not on file  Social History Narrative   Marital status: widowed since 2002; not dating which is bad in 2018      Children: 3 children (45, 45, 54); 13 grandchildren; 1 gg      Employment: disability since 2009; retail      Lives: alone in [place] in apartment      Tobacco: quit in 2011      Alcohol: none      Drugs: none      Exercise: minimal in 2018      ADLs: independent with ADLs; drives      Seatbelt: 100%; no texting.   Past Surgical History:  Procedure Laterality Date  . ABDOMINAL HYSTERECTOMY  1982   DUB; ovaries intact  . bladder surg  1992   bladder retraction  . BREAST BIOPSY    . BREAST REDUCTION SURGERY  1984  . CARDIAC  CATHETERIZATION  1990's   [name] [name]  . COLON SURGERY  05/11/11   [facility] Lysis of adhesions Crohn's  . MASS EXCISION Left 01/03/2018   Procedure: EXCISION LEFT UPPER BACK LIPOMA ERAS PATH;  Surgeon: Erroll Luna, MD;  Location: [place];  Service: General;  Laterality: Left;  . REDUCTION MAMMAPLASTY    . SPINE SURGERY     5 total (1 upper, 4 lumbar)   Past Medical History:  Diagnosis Date  . Anemia   . Blood transfusion without reported diagnosis   . Crohn's disease ([place])   . Disorder of sacroiliac joint   . Hyperlipidemia   . Hypertension   . Ischemic colitis ([place])   . Lumbago   . Lumbar post-laminectomy syndrome   . Lumbosacral neuritis   . Lumbosacral radiculitis   . Sciatica    There were no vitals taken for this visit.  Opioid Risk Score:   Fall Risk Score:  `1  Depression screen PHQ 2/9  Depression screen [facility] 2/9 04/18/2019 04/15/2019 09/27/2018 08/24/2018 05/08/2018 03/07/2018 10/18/2017  Decreased Interest 0 0 0 0 0 0 0  Down, Depressed, Hopeless 0 0 0 0 0 0 0  PHQ - 2 Score 0 0 0 0 0 0 0  Altered sleeping - - - 0 - - -  Tired, decreased energy - - - 0 - - -  Change in appetite - - - 0 - - -  Feeling bad or failure about yourself  - - - 0 - - -  Trouble concentrating - - - 0 - - -  Moving slowly or fidgety/restless - - - 0 - - -  Suicidal thoughts - - - 0 - - -  PHQ-9 Score - - - 0 - - -  Difficult doing work/chores - - - Not difficult at all - - -  Some recent data might be hidden     Review of Systems  Constitutional: Negative.   HENT: Negative.   Eyes: Negative.   Respiratory: Negative.   Cardiovascular: Negative.   Gastrointestinal: Positive for nausea.  Endocrine: Negative.   Genitourinary: Positive for difficulty urinating.  Musculoskeletal: Positive for arthralgias, back pain, gait problem and myalgias.  Skin: Negative.   Allergic/Immunologic: Negative.   Hematological: Negative.   Psychiatric/Behavioral: Negative.   All other  systems reviewed and are negative.  Objective:   Physical Exam Vitals signs and nursing note reviewed.  Constitutional:      Appearance: Normal appearance.  Neck:     Musculoskeletal: Normal range of motion and neck supple.  Cardiovascular:     Rate and Rhythm: Normal rate and regular rhythm.     Pulses: Normal pulses.     Heart sounds: Normal heart sounds.  Pulmonary:     Effort: Pulmonary effort is normal.     Breath sounds: Normal breath sounds.  Musculoskeletal:     Comments: Normal Muscle Bulk and Muscle Testing Reveals:  Upper Extremities: Full ROM and Muscle Strength 5/5  Lumbar Paraspinal Tenderness: L-4-L-5 Lower Extremities: Full ROM and Muscle Strength 5/5 Arises from Table Slowly Antalgic Gait   Neurological:     Mental Status: She is alert.           Assessment & Plan:  1.Lumbar Postlaminectomy/ Spinal Stenosis Lumbar Region/ Post-Op Pain/Low back pain with radiating symptoms in L3-4 distribution: continues to be limited by pain. Refilled:MSIR 15 mg one tablet twice a day as needed for moderate to sever pain#60andMorphine Sulfate ER15 mg Q 8 hours.05/20/2019. We will continue the opioid monitoring program, this consists of regular clinic visits, examinations, urine drug screen, pill counts as well as use of [place] Controlled Substance Reporting System. Encouraged to Continue exercise regime.  2. Lumbar Radiculopathy:ContinueTopamax:.Continue to Monitor11/30/2020 3.Chronic Right Knee Pain: No complaints today.  Continue with Ice TherapyandVoltaren Gel.05/20/2019 4. Muscle Spasm: Continuecurrent treatment: Tizanidine.11//30/2020 5. Lipoma: S/P Left Lipoma Excision of upper back By Dr. Brantley Stage on 01/03/2018. Surgery Following. Continue to Monitor.05/20/2019. 6. Paresthesia of Skin: ContinueTopamax.05/20/2019 7. Adhesive Capsulitis of Right Shoulder: S/PU/S Cortisoneinjectionon 01/13/2020with some relief noted.Continue  to monitor. Continue HEP as Tolerated.05/20/2019 8. Sacroiliac Joint Dysfunction: Will discussed with Dr. Willy Eddy regarding injection. Will call [name] [name] after speaking with Dr. Letta Pate. She verbalizes understanding.   71minutes of face to face patient care time was spent during this visit. All questions were encouraged and answered.  F/U in 1 month

## 2019-05-21 ENCOUNTER — Telehealth: Payer: Self-pay | Admitting: Registered Nurse

## 2019-05-21 NOTE — Telephone Encounter (Signed)
This provider spoke with Dr. Letta Pate regarding pain at her ischial tuberosity, we discussed injection. Placed a call to Ms. Nichol, she will consent to the injection. She will be scheduled for injection. Will speak with Dr Letta Pate in the morning and schedule her for the injection, she verbalizes understanding.

## 2019-05-23 ENCOUNTER — Telehealth: Payer: Self-pay | Admitting: Registered Nurse

## 2019-05-23 NOTE — Telephone Encounter (Signed)
Discussed with Dr. Letta Pate this morning regarding injection for [name].  She will be scheduled for Ischial Bursa Guided [place] Injection. Zorita Pang called Ms. Stancil regarding the above, she is in agreement and verbalizes understanding.

## 2019-05-24 LAB — TOXASSURE SELECT,+ANTIDEPR,UR

## 2019-05-28 ENCOUNTER — Telehealth: Payer: Self-pay | Admitting: *Deleted

## 2019-05-28 NOTE — Telephone Encounter (Signed)
Urine drug screen for this encounter is consistent for prescribed medication 

## 2019-06-06 ENCOUNTER — Ambulatory Visit: Payer: Medicare Other | Admitting: Physical Medicine & Rehabilitation

## 2019-06-10 ENCOUNTER — Other Ambulatory Visit: Payer: Self-pay

## 2019-06-10 ENCOUNTER — Encounter: Payer: Medicare Other | Attending: Physical Medicine & Rehabilitation | Admitting: Physical Medicine & Rehabilitation

## 2019-06-10 ENCOUNTER — Encounter: Payer: Self-pay | Admitting: Physical Medicine & Rehabilitation

## 2019-06-10 ENCOUNTER — Telehealth: Payer: Self-pay

## 2019-06-10 VITALS — BP 137/65 | HR 57 | Temp 97.7°F | Ht 64.0 in | Wt 212.0 lb

## 2019-06-10 DIAGNOSIS — M961 Postlaminectomy syndrome, not elsewhere classified: Secondary | ICD-10-CM | POA: Diagnosis not present

## 2019-06-10 DIAGNOSIS — Z79891 Long term (current) use of opiate analgesic: Secondary | ICD-10-CM | POA: Diagnosis not present

## 2019-06-10 DIAGNOSIS — Z5181 Encounter for therapeutic drug level monitoring: Secondary | ICD-10-CM | POA: Insufficient documentation

## 2019-06-10 DIAGNOSIS — M5414 Radiculopathy, thoracic region: Secondary | ICD-10-CM | POA: Diagnosis not present

## 2019-06-10 DIAGNOSIS — M5417 Radiculopathy, lumbosacral region: Secondary | ICD-10-CM | POA: Diagnosis not present

## 2019-06-10 DIAGNOSIS — G894 Chronic pain syndrome: Secondary | ICD-10-CM | POA: Insufficient documentation

## 2019-06-10 DIAGNOSIS — M7071 Other bursitis of hip, right hip: Secondary | ICD-10-CM

## 2019-06-10 MED ORDER — MORPHINE SULFATE 15 MG PO TABS: 15.0000 mg | ORAL_TABLET | Freq: Two times a day (BID) | ORAL | 0 refills | Status: DC | PRN

## 2019-06-10 MED ORDER — MORPHINE SULFATE ER 15 MG PO T12A: 1.0000 | EXTENDED_RELEASE_TABLET | Freq: Three times a day (TID) | ORAL | 0 refills | Status: DC

## 2019-06-10 NOTE — Patient Instructions (Signed)
Semimembranosus Tendinitis Rehab Ask your health care provider which exercises are safe for you. Do exercises exactly as told by your health care provider and adjust them as directed. It is normal to feel mild stretching, pulling, tightness, or discomfort as you do these exercises. Stop right away if you feel sudden pain or your pain gets worse. Do not begin these exercises until told by your health care provider. Stretching and range-of-motion exercise This exercise warms up your muscles and joints and improves the movement and flexibility of your thigh. This exercise also helps to relieve pain, numbness, and tingling. Hamstring stretch, supine  1. Lie on your back (supine position). 2. Loop a belt or towel over the ball of your left / right foot. The ball of your foot is on the walking surface, right under your toes. 3. Straighten your left / right knee and slowly pull on the belt to raise your leg. Stop when you feel a gentle stretch behind your knee or thigh (hamstring). ? Do not allow your knee to bend while you do this. ? Keep your other leg flat on the floor. 4. Hold this position for __________ seconds. Repeat __________ times. Complete this exercise __________ times a day. Strengthening exercises These exercises build strength and endurance in your thigh. Endurance is the ability to use your muscles for a long time, even after they get tired. Leg raises, prone This exercise strengthens the hip extensor muscles. 1. Lie on your abdomen on a bed or a firm surface (prone position). Place a pillow under your hips. 2. Bend your left / right knee so your foot is straight up in the air. 3. Squeeze your buttocks muscles and lift your left / right thigh off the bed. Do not let your back arch. 4. Hold this position for __________seconds. 5. Slowly return to the starting position. Let your muscles relax completely before you do another repetition. Repeat __________ times. Complete this exercise  __________ times a day. Bridge  This exercise is sometimes called a hip extensor exercise. If this exercise is too easy, try doing it with your arms crossed over your chest. 1. Lie on your back on a firm surface with your knees bent and your feet flat on the floor. 2. Tighten your buttocks muscles and lift your bottom off the floor until your trunk is level with your thighs. ? Do not arch your back. ? You should feel the muscles working in your buttocks and the back of your thighs (hip extensors). If you do not feel a stretch in these muscles, slide your feet 1-2 inches (2.5-5 cm) farther away from your buttocks. 3. Hold this position for __________ seconds. 4. Slowly lower your hips to the starting position. 5. Let your buttocks muscles relax completely between repetitions. Repeat __________ times. Complete this exercise __________ times a day. Eccentric hamstring, prone Eccentric hamstring exercise means that muscles in the back of the knee are lengthening under tension. 1. Lie on your abdomen on a bed or on the floor (prone position). 2. Start with your legs straight. Cross your legs at the ankles with your left / right leg on top. 3. Using your bottom leg to do the work, bend both knees. 4. Using just your left / right leg, slowly lower your leg back down toward the bed. Add a __________ weight as told by your health care provider. 5. Let your muscles relax completely between repetitions. Repeat __________ times. Complete this exercise __________ times a day. Squats 1. Stand  in front of a table with your feet and knees pointing straight ahead. You may rest your hands on the table for balance but not for support. 2. Slowly bend your knees and lower your hips like you are going to sit in a chair. ? Keep your thighs straight or pointed slightly outward. ? Keep your weight over your heels, not over your toes. ? Keep your lower legs upright so they are parallel with the table legs. ? Do not  let your hips go lower than your knees. ? Do not bend your knees lower than told by your health care provider. ? If your knee pain increases, do not bend as low. 3. Hold this position for __________ seconds. 4. Slowly push with your legs to return to standing. Do not use your hands to pull yourself to standing. Repeat __________ times. Complete this exercise __________ times a day. This information is not intended to replace advice given to you by your health care provider. Make sure you discuss any questions you have with your health care provider. Document Released: 06/06/2005 Document Revised: 09/28/2018 Document Reviewed: 07/03/2018 Elsevier Patient Education  2020 [employer].

## 2019-06-10 NOTE — Telephone Encounter (Signed)
Patient saw AK today for procedure - he scheduled with you 01.18.20-- she will be out of meds 12.30.20 --- can you call in in the meantime?

## 2019-06-10 NOTE — Progress Notes (Signed)
Right Ischial bursa injection under ultrasound guidance   Indication right ischial pain unresponsive to rest, analgesic medications. Informed consent was obtained after describing risks and benefits of the procedure with patient these include bleeding bruising and infection patient wishes to proceed and has given written consent.  Patient placed in a left lateral decubitus position.  The right ischial tuberosity was palpated and ultrasound images using curvilinear 4 Hz transducer identified the cortex of the ischial tuberosity at the depth of 5 cm.  Use sitting sagittal view long as cyst of the hamstring tendon complex in plane needle approach a 25-gauge 1.5 inch needle was inserted into the skin and subcu tissues and 1% lidocaine x3 cc was infiltrated.  Then a 22-gauge 8 cm echo block needle was inserted to an area 2 mm from the cortical edge of the ischial tuberosity.  Then a solution containing 1 cc of 6 mg/cc Celestone +4 cc of 1% lidocaine was injected.  Patient tolerated procedure well.  Post procedure instructions given

## 2019-06-10 NOTE — Telephone Encounter (Signed)
Dr. Letta Pate e-scribed her medication today, according to medication list.

## 2019-06-24 ENCOUNTER — Ambulatory Visit: Payer: Medicare Other

## 2019-06-25 ENCOUNTER — Telehealth: Payer: Self-pay

## 2019-06-25 NOTE — Telephone Encounter (Signed)
Patient called stated that the injection she last got did not help at all in the area she is hurting at and is asking if anything else can be done for her pain.

## 2019-06-25 NOTE — Telephone Encounter (Signed)
Patient may benefit from physical therapy.  Please ask if she wants [place] to send a referral.  I do not think there is any other injections that are likely to be helpful in her buttocks area

## 2019-06-26 NOTE — Telephone Encounter (Signed)
She does not want PT.  She says she can barely stand to walk and it is radiating down to her knee.  She has reached out to Dr Derion's office. They are asking for office notes ( she said a copy of the report).

## 2019-06-27 NOTE — Telephone Encounter (Signed)
We can move up her next appointment for a reassessment.  I do think it is appropriate to see her surgeon since he has done multiple procedures on her.  I would defer imaging to her surgeon

## 2019-06-28 NOTE — Telephone Encounter (Addendum)
We will try and move up her appointment and place on K[facility] schedule. I have told Ms Boniface to have her surgeon's office fax a request for her records so we can send his notes to them.

## 2019-07-08 ENCOUNTER — Ambulatory Visit: Payer: Medicare Other | Admitting: Registered Nurse

## 2019-07-09 ENCOUNTER — Other Ambulatory Visit: Payer: Self-pay

## 2019-07-09 ENCOUNTER — Encounter: Payer: Medicare Other | Attending: Physical Medicine & Rehabilitation | Admitting: Physical Medicine & Rehabilitation

## 2019-07-09 ENCOUNTER — Encounter: Payer: Self-pay | Admitting: Physical Medicine & Rehabilitation

## 2019-07-09 DIAGNOSIS — Z79891 Long term (current) use of opiate analgesic: Secondary | ICD-10-CM | POA: Diagnosis not present

## 2019-07-09 DIAGNOSIS — M4807 Spinal stenosis, lumbosacral region: Secondary | ICD-10-CM | POA: Diagnosis not present

## 2019-07-09 DIAGNOSIS — M961 Postlaminectomy syndrome, not elsewhere classified: Secondary | ICD-10-CM | POA: Diagnosis not present

## 2019-07-09 DIAGNOSIS — M5414 Radiculopathy, thoracic region: Secondary | ICD-10-CM | POA: Diagnosis not present

## 2019-07-09 DIAGNOSIS — Z5181 Encounter for therapeutic drug level monitoring: Secondary | ICD-10-CM | POA: Insufficient documentation

## 2019-07-09 DIAGNOSIS — M5417 Radiculopathy, lumbosacral region: Secondary | ICD-10-CM | POA: Diagnosis not present

## 2019-07-09 DIAGNOSIS — G894 Chronic pain syndrome: Secondary | ICD-10-CM | POA: Insufficient documentation

## 2019-07-09 MED ORDER — TOPIRAMATE 50 MG PO TABS: ORAL_TABLET | ORAL | 1 refills | Status: DC

## 2019-07-09 MED ORDER — MORPHINE SULFATE ER 15 MG PO T12A: 1.0000 | EXTENDED_RELEASE_TABLET | Freq: Three times a day (TID) | ORAL | 0 refills | Status: DC

## 2019-07-09 MED ORDER — MORPHINE SULFATE 15 MG PO TABS: 15.0000 mg | ORAL_TABLET | Freq: Two times a day (BID) | ORAL | 0 refills | Status: DC | PRN

## 2019-07-09 NOTE — Progress Notes (Signed)
Subjective:    Patient ID: [name] [name], female    DOB: 1956-06-13, 64 y.o.   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 L2-3, ADJACENT LEVEL LUMBAR SPINAL STENOSIS/DISC DEGENERATION L5-S1,   11/28/2014 THE PATIENT UNDERWENT UNCOMPLICATED BILATERAL LUMBAR DECOMPRESSION L2-SACRUM, METAL REMOVAL/FUSION EXPLORATION L3-4, L4-5 [SOLID FUSION BILATERAL L3-4, L4-5], INSTRUMENTATION/POSTEROLATERAL FUSION L2-3, L5-S1, RIGHT POSTERIOR ILIAC CREST BONE GRAFT [name] [name] [name] at [employer], [place] [name] [name]  She underwent uncomplicated Bilateral re-exploration Lumbar Decompression L-2-3, L3-L4, L-4- 5L5-S1 metal removal (expedium) L2-3, L5-S1 Fusion exploration L4-5, L5-S1 Repair Non-Union with Re instrumentation Left iliac crest bone graftwith [name]. Velna [name] on 07/22/2016. HPI 64 year old female with chronic low back pain she has history of 4 lumbar surgeries, 2 performed by [name] [name] and 2 performed by [name] [name] in [place].  She has had chronic pain in the low back and the buttock area.  She has been on chronic narcotic analgesics both MS Contin 15 mg 3 times daily and MSIR 15 mg twice daily.  She has been followed at this office for approximately 10 years  The patient's current complaints are mainly in the right upper and medial thigh area.  She denies any trauma to that area.  She has no pain with having a bowel movement.  She does have pain sitting on the toilet.  The patient has no bladder dysfunction.  She does not have pain with hip movement.  She has some pain that goes down into the right foot as well.  She has difficulty sitting on her right buttocks however ultrasound-guided ischial bursa injection on the right side did not provide any relief of this  pain.  Pain Inventory Average Pain 10 Pain Right Now 10 My pain is constant, sharp, stabbing and aching  In the last 24 hours, has pain interfered with the following? General activity 10 Relation with others 10 Enjoyment of life 10 What TIME of day is your pain at its worst? all Sleep (in general) Poor  Pain is worse with: walking, bending, sitting, inactivity and standing Pain improves with: nothing Relief from Meds: 0  Mobility walk with assistance use a cane  Function not employed: date last employed . disabled: date disabled .  Neuro/Psych weakness trouble walking spasms  Prior Studies Any changes since last visit?  no  Physicians involved in your care Any changes since last visit?  no   Family History  Problem Relation Age of Onset  . Heart disease Mother 36       CHF; CAD  . Hypertension Mother   . Heart failure Mother   . Diabetes Sister   . Lupus Sister   . Kidney failure Sister   . Hypertension Brother   . Hyperlipidemia Sister   . Hypertension Sister   . Colon cancer Neg Hx   . Rectal cancer Neg Hx    Social History   Socioeconomic History  . Marital status: Widowed    Spouse name: Not on file  . Number of children: 3  . Years of education: Not on file  . Highest education level: Not on file  Occupational History  . 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 Smoking status: Former Smoker    Packs/day: 0.50    Years: 20.00    Pack years: 10.00  Types: Cigarettes    Quit date: 07/21/2008    Years since quitting: 10.9  . Smokeless tobacco: Never Used  Substance and Sexual Activity  . Alcohol use: No  . Drug use: No  . Sexual activity: Not on file  Other Topics Concern  . Not on file  Social History Narrative   Marital status: widowed since 2002; not dating which is bad in 2018      Children: 3 children (45, 38, 38); 13 grandchildren; 1 gg      Employment: disability since 2009; retail      Lives:  alone in [place] in apartment      Tobacco: quit in 2011      Alcohol: none      Drugs: none      Exercise: minimal in 2018      ADLs: independent with ADLs; drives      Seatbelt: 100%; no texting.   Social Determinants of Health   Financial Resource Strain:   . Difficulty of Paying Living Expenses: Not on file  Food Insecurity:   . Worried About Charity fundraiser in the Last Year: Not on file  . Ran Out of Food in the Last Year: Not on file  Transportation Needs:   . Lack of Transportation (Medical): Not on file  . Lack of Transportation (Non-Medical): Not on file  Physical Activity:   . Days of Exercise per Week: Not on file  . Minutes of Exercise per Session: Not on file  Stress:   . Feeling of Stress : Not on file  Social Connections:   . Frequency of Communication with Friends and Family: Not on file  . Frequency of Social Gatherings with Friends and Family: Not on file  . Attends Religious Services: Not on file  . Active Member of Clubs or Organizations: Not on file  . Attends Archivist Meetings: Not on file  . Marital Status: Not on file   Past Surgical History:  Procedure Laterality Date  . ABDOMINAL HYSTERECTOMY  1982   DUB; ovaries intact  . bladder surg  1992   bladder retraction  . BREAST BIOPSY    . BREAST REDUCTION SURGERY  1984  . CARDIAC CATHETERIZATION  1990's   [name]  . COLON SURGERY  05/11/11   [facility] Lysis of adhesions Crohn's  . MASS EXCISION Left 01/03/2018   Procedure: EXCISION LEFT UPPER BACK LIPOMA ERAS PATH;  Surgeon: [name] Luna, MD;  Location: [facility];  Service: General;  Laterality: Left;  . REDUCTION MAMMAPLASTY    . SPINE SURGERY     5 total (1 upper, 4 lumbar)   Past Medical History:  Diagnosis Date  . Anemia   . Blood transfusion without reported diagnosis   . Crohn's disease ([place])   . Disorder of sacroiliac joint   . Hyperlipidemia   . Hypertension   . Ischemic colitis ([place])   .  Lumbago   . Lumbar post-laminectomy syndrome   . Lumbosacral neuritis   . Lumbosacral radiculitis   . Sciatica    BP (!) 154/79   Pulse (!) 58   Temp 97.7 F (36.5 C)   Ht 5\' 4"  (1.626 m)   Wt 218 lb (98.9 kg)   SpO2 96%   BMI 37.42 kg/m   Opioid Risk Score:   Fall Risk Score:  `1  Depression screen PHQ 2/9  Depression screen [facility] 2/9 04/18/2019 04/15/2019 09/27/2018 08/24/2018 05/08/2018 03/07/2018 10/18/2017  Decreased Interest 0 0  0 0 0 0 0  Down, Depressed, Hopeless 0 0 0 0 0 0 0  PHQ - 2 Score 0 0 0 0 0 0 0  Altered sleeping - - - 0 - - -  Tired, decreased energy - - - 0 - - -  Change in appetite - - - 0 - - -  Feeling bad or failure about yourself  - - - 0 - - -  Trouble concentrating - - - 0 - - -  Moving slowly or fidgety/restless - - - 0 - - -  Suicidal thoughts - - - 0 - - -  PHQ-9 Score - - - 0 - - -  Difficult doing work/chores - - - Not difficult at all - - -  Some recent data might be hidden    Review of Systems  Constitutional: Negative.   HENT: Negative.   Eyes: Negative.   Respiratory: Negative.   Cardiovascular: Negative.   Gastrointestinal: Negative.   Endocrine: Negative.   Genitourinary: Negative.   Musculoskeletal: Positive for arthralgias, back pain and gait problem.       Spasms   Skin: Negative.   Allergic/Immunologic: Negative.   Hematological: Negative.   Psychiatric/Behavioral: Negative.   All other systems reviewed and are negative.      Objective:   Physical Exam Vitals and nursing note reviewed.  Constitutional:      Appearance: She is obese.  HENT:     Head: Normocephalic and atraumatic.  Eyes:     Extraocular Movements: Extraocular movements intact.     Conjunctiva/sclera: Conjunctivae normal.     Pupils: Pupils are equal, round, and reactive to light.  Skin:    General: Skin is warm and dry.  Neurological:     General: No focal deficit present.     Mental Status: She is alert and oriented to person, place, and time.      Coordination: Coordination is intact.  Psychiatric:        Mood and Affect: Mood normal.        Behavior: Behavior normal.   There is minor pain with hip internal rotation no pain with hip external rotation  Negative straight leg raising bilaterally   Patient ambulates with a wide-based gait which she states is due to pain.  She has no evidence of ataxia. Her motor strength is 5/5 bilateral hip flexor knee extensor ankle dorsiflexors. She has normal sensation in L2-L3-L4 L5-S1 dermatomal distribution Deep tendon reflexes are 2+ bilateral knees and absent at the ankles.      Assessment & Plan:  #1.  Lumbar postlaminectomy syndrome status post multiple fusion she has been fused from L2 through the sacrum.  Certainly sacroiliac pain often occurs after lumbar fusion however patient's pain is not in a typical area.  Patient has no relief with ischial bursa injection.  We discussed that there could be some nerve root irritation from epidural scarring or possibly an upper lumbar stenosis.  I ordered an MRI of the lumbar spine with and without contrast.  I recommended that she follow-up with her spine surgeon [name]. Velna [name].  We may consider sacroiliac injection if work-up for spinal stenosis/lumbar radiculopathy is negative. Continue current medications with exception of Topamax will increase to 50 twice daily she does not tolerate gabapentin.  We will see if this helps with a neurogenic component to pain MD follow-up in 1 month

## 2019-07-12 ENCOUNTER — Other Ambulatory Visit: Payer: Self-pay | Admitting: Registered Nurse

## 2019-07-15 ENCOUNTER — Other Ambulatory Visit: Payer: Self-pay | Admitting: *Deleted

## 2019-07-15 MED ORDER — DIAZEPAM 10 MG PO TABS: ORAL_TABLET | ORAL | 0 refills | Status: DC

## 2019-07-22 ENCOUNTER — Other Ambulatory Visit: Payer: Self-pay

## 2019-07-22 ENCOUNTER — Ambulatory Visit
Admission: RE | Admit: 2019-07-22 | Discharge: 2019-07-22 | Disposition: A | Discharge status: Home / Self Care | Payer: Medicare Other | Source: Ambulatory Visit | Attending: Physical Medicine & Rehabilitation | Admitting: Physical Medicine & Rehabilitation

## 2019-07-22 DIAGNOSIS — M4807 Spinal stenosis, lumbosacral region: Secondary | ICD-10-CM | POA: Diagnosis not present

## 2019-07-22 DIAGNOSIS — M5126 Other intervertebral disc displacement, lumbar region: Secondary | ICD-10-CM | POA: Diagnosis not present

## 2019-07-22 LAB — POCT I-STAT CREATININE: Creatinine, Ser: 1.1 mg/dL — ABNORMAL HIGH (ref 0.44–1.00)

## 2019-07-22 MED ORDER — GADOBUTROL 1 MMOL/ML IV SOLN
9.0000 mL | Freq: Once | INTRAVENOUS | Status: AC | PRN
  Administered 2019-07-22: 9 mL via INTRAVENOUS

## 2019-08-06 ENCOUNTER — Encounter: Payer: Medicare Other | Attending: Physical Medicine & Rehabilitation | Admitting: Physical Medicine & Rehabilitation

## 2019-08-06 ENCOUNTER — Encounter: Payer: Self-pay | Admitting: Physical Medicine & Rehabilitation

## 2019-08-06 ENCOUNTER — Other Ambulatory Visit: Payer: Self-pay

## 2019-08-06 VITALS — BP 140/74 | HR 69 | Temp 97.7°F | Ht 64.0 in | Wt 218.0 lb

## 2019-08-06 DIAGNOSIS — M25551 Pain in right hip: Secondary | ICD-10-CM | POA: Diagnosis not present

## 2019-08-06 DIAGNOSIS — M961 Postlaminectomy syndrome, not elsewhere classified: Secondary | ICD-10-CM | POA: Insufficient documentation

## 2019-08-06 DIAGNOSIS — G8929 Other chronic pain: Secondary | ICD-10-CM | POA: Insufficient documentation

## 2019-08-06 DIAGNOSIS — Z5181 Encounter for therapeutic drug level monitoring: Secondary | ICD-10-CM | POA: Insufficient documentation

## 2019-08-06 DIAGNOSIS — M5414 Radiculopathy, thoracic region: Secondary | ICD-10-CM | POA: Insufficient documentation

## 2019-08-06 DIAGNOSIS — M5417 Radiculopathy, lumbosacral region: Secondary | ICD-10-CM | POA: Insufficient documentation

## 2019-08-06 DIAGNOSIS — G894 Chronic pain syndrome: Secondary | ICD-10-CM | POA: Insufficient documentation

## 2019-08-06 DIAGNOSIS — Z79891 Long term (current) use of opiate analgesic: Secondary | ICD-10-CM | POA: Insufficient documentation

## 2019-08-06 DIAGNOSIS — M4807 Spinal stenosis, lumbosacral region: Secondary | ICD-10-CM | POA: Insufficient documentation

## 2019-08-06 MED ORDER — DIAZEPAM 5 MG PO TABS: 5.0000 mg | ORAL_TABLET | Freq: Once | ORAL | 0 refills | Status: AC

## 2019-08-06 NOTE — Patient Instructions (Signed)
Sacroiliac Joint Injection  A sacroiliac (SI) joint injection is a procedure to inject a numbing medicine (anesthetic block)--and sometimes a strong anti-inflammatory medicine (steroid)--into the SI joint. The SI joint is the joint between two bones of the pelvis called the sacrum and the ilium. The sacrum is the bone at the base of the spine. The ilium is the large bone that forms the hip. You may need this procedure if you have pain because of an inflamed or diseased SI joint. Various conditions can cause pain in the SI joint, including rheumatoid arthritis, gout, psoriatic arthritis, infection, or injury. SI joint pain is a common cause of low back pain. It may also cause pain in your buttock or leg. SI joint injection may be done to:  Find out if an anesthetic block relieves pain. This can confirm that the SI joint is the cause of pain (diagnostic use).  Treat a painful SI joint with steroids, anesthetic medicine, or both (therapeutic use). Tell a health care provider about:  Any allergies you have.  All medicines you are taking, including vitamins, herbs, eye drops, creams, and over-the-counter medicines.  Any problems you or family members have had with anesthetic medicines.  Any blood disorders you have.  Any surgeries you have had.  Any medical conditions you have.  Whether you are pregnant or may be pregnant. What are the risks? Generally, this is a safe procedure. However, problems may occur, including:  Infection.  Bleeding.  Nerve injury.  Temporary increase in pain.  Headache.  Failure of the procedure to relieve pain.  Bruising or soreness at the joint, in deep tissues, or at the injection site.  Allergic reactions to medicines or dyes.  Side effects from the steroid medicine. These may include facial flushing, increased appetite, diarrhea, and increased blood sugar. What happens before the procedure?  You may have a physical exam.  You may have imaging  tests, such as an X-ray, CT scan, or MRI.  Ask your health care provider about: ? Changing or stopping your regular medicines. This is especially important if you are taking diabetes medicines or blood thinners. ? Taking medicines such as aspirin and ibuprofen. These medicines can thin your blood. Do not take these medicines unless your health care provider tells you to take them. ? Taking over-the-counter medicines, vitamins, herbs, and supplements.  Plan to have someone take you home from the hospital or clinic. What happens during the procedure?  To lower your risk of infection: ? Your health care team will wash or sanitize their hands. ? Your skin will be washed with a germ-killing (antiseptic) solution.  You may be given one or more of the following: ? A medicine to help you relax (sedative). ? A medicine to numb the area (local anesthetic). Your health care provider will inject a local anesthetic into the skin above your SI joint.  You will be placed in the proper position on a procedure table to give the health care team the best access to your SI joint.  An X-ray machine that produces moving X-ray images (fluoroscopy) will be placed above the procedure table.  A long, thin needle will be inserted through your skin and down to your SI joint.  The position of the needle will be checked with fluoroscopy imaging.  An X-ray dye (contrast media) will be injected to make sure the needle enters the joint space. You may be asked if you feel any pain.  Long-acting anesthetic medicine will be injected. Long-acting steroid  medicine may also be injected.  The needle will be removed, and a bandage will be placed over the injection site. The procedure may vary among health care providers and hospitals. What happens after the procedure?  Your blood pressure, heart rate, breathing rate, and blood oxygen level will be monitored until the medicines you were given have worn off.  If dye was  used, you will be told to drink plenty of water to wash (flush) the dye out of your body.  You may be asked if you have pain relief from the injection.  You will likely be able to go home shortly after the procedure.  Your health care provider will give you instructions for taking care of yourself after the procedure. These may include instructions for doing physical therapy exercises.  Do not drive for 24 hours if you were given a sedative during the procedure. Summary  A sacroiliac (SI) joint injection is an injection of a numbing medicine (anesthetic block)--and sometimes a strong anti-inflammatory medicine (steroid)--into the SI joint.  You will be awake during the procedure, but the injection area will be made numb.  If you were given a medicine to help you relax (sedative during the procedure, do not drive for at least 24 hours. This information is not intended to replace advice given to you by your health care provider. Make sure you discuss any questions you have with your health care provider. Document Revised: 05/19/2017 Document Reviewed: 03/13/2017 Elsevier Patient Education  2020 [employer].

## 2019-08-06 NOTE — Progress Notes (Signed)
Subjective:    Patient ID: [name] [name], female    DOB: 05-03-1956, 64 y.o.   MRN: [id number] 11/28/2014 THE PATIENT UNDERWENT UNCOMPLICATED BILATERAL LUMBAR DECOMPRESSION L2-SACRUM, METAL REMOVAL/FUSION EXPLORATION L3-4, L4-5 [SOLID FUSION BILATERAL L3-4, L4-5], INSTRUMENTATION/POSTEROLATERAL FUSION L2-3, L5-S1, RIGHT POSTERIOR ILIAC CREST BONE GRAFT Dr Loreen Freud at [place], [place]  She underwent uncomplicated Bilateral re-exploration Lumbar Decompression L-2-3, L3-L4, L-4- 5L5-S1 metal removal (expedium) L2-3, L5-S1 Fusion exploration L4-5, L5-S1 Repair Non-Union with Re instrumentation Left iliac crest bone graftwith Dr. Velna Ochs on 07/22/2016.  64 year old female with chronic low back pain she has history of 4 lumbar surgeries, 2 performed by Dr. Lorin Mercy and 2 performed by Dr. Regenia Skeeter in [place].  She has had chronic pain in the low back and the buttock area.  She has been on chronic narcotic analgesics both MS Contin 15 mg 3 times daily and MSIR 15 mg twice daily.  She has been followed at this office for approximately 10 years HPI The patient has undergone ischial bursa injection under ultrasound guidance which was not helpful for her pain. She does not have any pain going down into her foot.  Her pain is mainly concentrated in her buttocks area.  She does get some radiation into the thigh. No progressive weakness in the lower extremity.  No bowel or bladder function issues. She does not like to sit on the right side of her buttocks.  It is painful for her. She has an appointment with her spine surgeon next week Pain Inventory Average Pain 10 Pain Right Now 10 My pain is constant, sharp, burning, stabbing and aching  In the last 24 hours, has pain interfered with the following? General activity 10 Relation with others 10 Enjoyment of life 10 What TIME of day is your pain at its worst? daytime Sleep (in general) Poor  Pain is worse with: walking, bending, sitting, inactivity  and standing Pain improves with: medication Relief from Meds: 1  Mobility use a cane  Function disabled: date disabled 2009  Neuro/Psych trouble walking  Prior Studies Any changes since last visit?  no  Physicians involved in your care Any changes since last visit?  no   Family History  Problem Relation Age of Onset  . Heart disease Mother 60       CHF; CAD  . Hypertension Mother   . Heart failure Mother   . Diabetes Sister   . Lupus Sister   . Kidney failure Sister   . Hypertension Brother   . Hyperlipidemia Sister   . Hypertension Sister   . Colon cancer Neg Hx   . Rectal cancer Neg Hx    Social History   Socioeconomic History  . Marital status: Widowed    Spouse name: Not on file  . Number of children: 3  . Years of education: Not on file  . Highest education level: Not on file  Occupational History  . 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 Smoking status: Former Smoker    Packs/day: 0.50    Years: 20.00    Pack years: 10.00    Types: Cigarettes    Quit date: 07/21/2008    Years since quitting: 11.0  . Smokeless tobacco: Never Used  Substance and Sexual Activity  . Alcohol use: No  . Drug use: No  . Sexual activity: Not on file  Other Topics Concern  . Not on file  Social History Narrative   Marital status:  widowed since 2002; not dating which is bad in 2018      Children: 3 children (45, 57, 56); 13 grandchildren; 1 gg      Employment: disability since 2009; retail      Lives: alone in [place] in apartment      Tobacco: quit in 2011      Alcohol: none      Drugs: none      Exercise: minimal in 2018      ADLs: independent with ADLs; drives      Seatbelt: 100%; no texting.   Social Determinants of Health   Financial Resource Strain:   . Difficulty of Paying Living Expenses: Not on file  Food Insecurity:   . Worried About Charity fundraiser in the Last Year: Not on file  . Ran Out of Food in the Last  Year: Not on file  Transportation Needs:   . Lack of Transportation (Medical): Not on file  . Lack of Transportation (Non-Medical): Not on file  Physical Activity:   . Days of Exercise per Week: Not on file  . Minutes of Exercise per Session: Not on file  Stress:   . Feeling of Stress : Not on file  Social Connections:   . Frequency of Communication with Friends and Family: Not on file  . Frequency of Social Gatherings with Friends and Family: Not on file  . Attends Religious Services: Not on file  . Active Member of Clubs or Organizations: Not on file  . Attends Archivist Meetings: Not on file  . Marital Status: Not on file   Past Surgical History:  Procedure Laterality Date  . ABDOMINAL HYSTERECTOMY  1982   DUB; ovaries intact  . bladder surg  1992   bladder retraction  . BREAST BIOPSY    . BREAST REDUCTION SURGERY  1984  . CARDIAC CATHETERIZATION  1990's   [name]  . COLON SURGERY  05/11/11   [facility] Lysis of adhesions Crohn's  . MASS EXCISION Left 01/03/2018   Procedure: EXCISION LEFT UPPER BACK LIPOMA ERAS PATH;  Surgeon: Erroll Luna, MD;  Location: [facility];  Service: General;  Laterality: Left;  . REDUCTION MAMMAPLASTY    . SPINE SURGERY     5 total (1 upper, 4 lumbar)   Past Medical History:  Diagnosis Date  . Anemia   . Blood transfusion without reported diagnosis   . Crohn's disease ([place])   . Disorder of sacroiliac joint   . Hyperlipidemia   . Hypertension   . Ischemic colitis ([place])   . Lumbago   . Lumbar post-laminectomy syndrome   . Lumbosacral neuritis   . Lumbosacral radiculitis   . Sciatica    BP 140/74   Pulse 69   Temp 97.7 F (36.5 C)   Ht 5\' 4"  (1.626 m)   Wt 218 lb (98.9 kg)   SpO2 95%   BMI 37.42 kg/m   Opioid Risk Score:   Fall Risk Score:  `1  Depression screen PHQ 2/9  Depression screen [facility] 2/9 04/18/2019 04/15/2019 09/27/2018 08/24/2018 05/08/2018 03/07/2018 10/18/2017  Decreased Interest 0 0 0 0 0 0 0   Down, Depressed, Hopeless 0 0 0 0 0 0 0  PHQ - 2 Score 0 0 0 0 0 0 0  Altered sleeping - - - 0 - - -  Tired, decreased energy - - - 0 - - -  Change in appetite - - - 0 - - -  Feeling  bad or failure about yourself  - - - 0 - - -  Trouble concentrating - - - 0 - - -  Moving slowly or fidgety/restless - - - 0 - - -  Suicidal thoughts - - - 0 - - -  PHQ-9 Score - - - 0 - - -  Difficult doing work/chores - - - Not difficult at all - - -  Some recent data might be hidden   \  Review of Systems  Constitutional: Negative.   HENT: Negative.   Eyes: Negative.   Respiratory: Negative.   Cardiovascular: Negative.   Gastrointestinal: Negative.   Endocrine: Negative.   Genitourinary: Negative.   Musculoskeletal: Positive for arthralgias, back pain and gait problem.  Skin: Negative.   Allergic/Immunologic: Negative.   Hematological: Negative.   Psychiatric/Behavioral: Negative.   All other systems reviewed and are negative.      Objective:   Physical Exam Vitals and nursing note reviewed.  Constitutional:      Appearance: She is obese.  HENT:     Head: Normocephalic and atraumatic.     Nose: Nose normal.     Mouth/Throat:     Mouth: Mucous membranes are dry.     Pharynx: Oropharynx is clear.  Eyes:     Extraocular Movements: Extraocular movements intact.     Conjunctiva/sclera: Conjunctivae normal.     Pupils: Pupils are equal, round, and reactive to light.  Musculoskeletal:     Comments: There is tenderness over the right PSIS area.  There is also some tenderness around L5 but really not above L5 area. Negative straight leg raising   Neurological:     Mental Status: She is alert and oriented to person, place, and time.     Sensory: Sensory deficit present.     Motor: Motor function is intact.     Comments: The patient has a wide-based gait but no evidence of ataxia.  There is no evidence of increased tone in the lower extremities  Sensation reduced right L3-L4-L5  distribution  Psychiatric:        Mood and Affect: Mood normal.        Behavior: Behavior normal.        Thought Content: Thought content normal.           Assessment & Plan:  #1.  Lumbar postlaminectomy syndrome she has had multiple lumbar fusions L2 through sacrum with subsequent hardware removal at L2-3 and L5-S1.Marland Kitchen  Her current symptoms are mainly in the right buttock area.  This appears to be most consistent with sacroiliac disorder which frequently occurs after lumbosacral fusions.  Will order sacroiliac injection under fluoroscopic guidance.  Will do this prior to her orthopedic spine surgery appointment.  We will also check pelvic x-ray and lumbosacral spine.  Was not able to visualize the hardware due to metallic artifact on MRI. Lumbar MRI showed no significant adjacent level issues in the upper lumbar area She may ultimately need CT myelography but I will defer to orthopedics on that.  We will schedule for sacroiliac injection under fluoroscopic guidance. Continue current dose of morphine MS Contin 15 mg 3 times daily as well as MSIR 15 mg twice daily

## 2019-08-09 ENCOUNTER — Other Ambulatory Visit: Payer: Self-pay | Admitting: Registered Nurse

## 2019-08-09 ENCOUNTER — Other Ambulatory Visit: Payer: Self-pay | Admitting: Family Medicine

## 2019-08-14 ENCOUNTER — Telehealth: Payer: Self-pay

## 2019-08-14 MED ORDER — MORPHINE SULFATE 15 MG PO TABS: 15.0000 mg | ORAL_TABLET | Freq: Two times a day (BID) | ORAL | 0 refills | Status: DC | PRN

## 2019-08-14 MED ORDER — MORPHINE SULFATE ER 15 MG PO T12A: 1.0000 | EXTENDED_RELEASE_TABLET | Freq: Three times a day (TID) | ORAL | 0 refills | Status: DC

## 2019-08-14 NOTE — Telephone Encounter (Signed)
Patient called stating need refill on Morphine Sulfate ER #90 last filled 07/20/2019 and Morphine Sulfate IR #60 last filled 07/20/2019. Had to reschedule appt due to [place] exposure.

## 2019-08-14 NOTE — Telephone Encounter (Signed)
PMP was Reviewed. [name] [name] was seen in the office on 08/06/2019, she was scheduled for SI injection, due to [place] exposure appointment was cancelled.  Placed a call to Ms. Schank no answer, left message for her to return the call. She will need to be scheduled for an appointment and medication was e-scribed.

## 2019-08-16 ENCOUNTER — Encounter: Payer: Medicare Other | Admitting: Physical Medicine & Rehabilitation

## 2019-08-16 ENCOUNTER — Telehealth: Payer: Self-pay | Admitting: Registered Nurse

## 2019-08-16 NOTE — Telephone Encounter (Signed)
She will call and reschedule her apt once she gets tested again for COVID on 3/6. She has had one negative result, but the health department wants her to be tested again to ensure she is not positive. She was exposed to a family member who was sick.

## 2019-08-16 NOTE — Telephone Encounter (Signed)
Ok thanks 

## 2019-09-09 DIAGNOSIS — Z981 Arthrodesis status: Secondary | ICD-10-CM | POA: Diagnosis not present

## 2019-09-09 DIAGNOSIS — M5136 Other intervertebral disc degeneration, lumbar region: Secondary | ICD-10-CM | POA: Diagnosis not present

## 2019-09-11 ENCOUNTER — Encounter: Payer: Medicare Other | Attending: Physical Medicine & Rehabilitation | Admitting: Registered Nurse

## 2019-09-11 ENCOUNTER — Other Ambulatory Visit: Payer: Self-pay

## 2019-09-11 ENCOUNTER — Encounter: Payer: Self-pay | Admitting: Registered Nurse

## 2019-09-11 VITALS — BP 136/82 | HR 60 | Temp 97.5°F | Ht 64.0 in | Wt 214.0 lb

## 2019-09-11 DIAGNOSIS — G8929 Other chronic pain: Secondary | ICD-10-CM | POA: Insufficient documentation

## 2019-09-11 DIAGNOSIS — M961 Postlaminectomy syndrome, not elsewhere classified: Secondary | ICD-10-CM | POA: Diagnosis not present

## 2019-09-11 DIAGNOSIS — M5417 Radiculopathy, lumbosacral region: Secondary | ICD-10-CM | POA: Insufficient documentation

## 2019-09-11 DIAGNOSIS — M25551 Pain in right hip: Secondary | ICD-10-CM | POA: Insufficient documentation

## 2019-09-11 DIAGNOSIS — M4807 Spinal stenosis, lumbosacral region: Secondary | ICD-10-CM | POA: Insufficient documentation

## 2019-09-11 DIAGNOSIS — M5414 Radiculopathy, thoracic region: Secondary | ICD-10-CM | POA: Diagnosis not present

## 2019-09-11 DIAGNOSIS — G894 Chronic pain syndrome: Secondary | ICD-10-CM | POA: Insufficient documentation

## 2019-09-11 DIAGNOSIS — Z79891 Long term (current) use of opiate analgesic: Secondary | ICD-10-CM | POA: Diagnosis not present

## 2019-09-11 DIAGNOSIS — Z5181 Encounter for therapeutic drug level monitoring: Secondary | ICD-10-CM | POA: Diagnosis not present

## 2019-09-11 DIAGNOSIS — M5416 Radiculopathy, lumbar region: Secondary | ICD-10-CM

## 2019-09-11 MED ORDER — MORPHINE SULFATE ER 15 MG PO T12A: 1.0000 | EXTENDED_RELEASE_TABLET | Freq: Three times a day (TID) | ORAL | 0 refills | Status: DC

## 2019-09-11 MED ORDER — MORPHINE SULFATE 15 MG PO TABS: 15.0000 mg | ORAL_TABLET | Freq: Two times a day (BID) | ORAL | 0 refills | Status: DC | PRN

## 2019-09-11 NOTE — Progress Notes (Signed)
Subjective:    Patient ID: [name] [name], female    DOB: 09-22-1955, 64 y.o.   MRN: [id number]  HPI: [name] [name] is a 64 y.o. female who returns for follow up appointment for chronic pain and medication refill. She states her pain is located in her lower back radiating into her right lower extremity and right foot with tingling and burning. She rates her pain 10. Her current exercise regime is walking.  [name] Morphine equivalent is 75.00 MME.  Oral Swab Performed Today.    Pain Inventory Average Pain 10 Pain Right Now 10 My pain is constant, sharp, burning, stabbing, tingling and aching  In the last 24 hours, has pain interfered with the following? General activity 10 Relation with others 10 Enjoyment of life 10 What TIME of day is your pain at its worst? all Sleep (in general) Poor  Pain is worse with: inactivity Pain improves with: rest, heat/ice and medication Relief from Meds: 1  Mobility walk with assistance use a cane ability to climb steps?  yes do you drive?  yes  Function not employed: date last employed 03/08 disabled: date disabled 05/09 I need assistance with the following:  household duties and shopping  Neuro/Psych weakness trouble walking  Prior Studies Any changes since last visit?  no  Physicians involved in your care Any changes since last visit?  no   Family History  Problem Relation Age of Onset  . Heart disease Mother 51       CHF; CAD  . Hypertension Mother   . Heart failure Mother   . Diabetes Sister   . Lupus Sister   . Kidney failure Sister   . Hypertension Brother   . Hyperlipidemia Sister   . Hypertension Sister   . Colon cancer Neg Hx   . Rectal cancer Neg Hx    Social History   Socioeconomic History  . Marital status: Widowed    Spouse name: Not on file  . Number of children: 3  . Years of education: Not on file  . Highest education level: Not on file  Occupational History  . Occupation: disability      Comment: 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  Tobacco Use  . Smoking status: Former Smoker    Packs/day: 0.50    Years: 20.00    Pack years: 10.00    Types: Cigarettes    Quit date: 07/21/2008    Years since quitting: 11.1  . Smokeless tobacco: Never Used  Substance and Sexual Activity  . Alcohol use: No  . Drug use: No  . Sexual activity: Not on file  Other Topics Concern  . Not on file  Social History Narrative   Marital status: widowed since 2002; not dating which is bad in 2018      Children: 3 children (45, 59, 30); 13 grandchildren; 1 gg      Employment: disability since 2009; retail      Lives: alone in [place] in apartment      Tobacco: quit in 2011      Alcohol: none      Drugs: none      Exercise: minimal in 2018      ADLs: independent with ADLs; drives      Seatbelt: 100%; no texting.   Social Determinants of Health   Financial Resource Strain:   . Difficulty of Paying Living Expenses:   Food Insecurity:   . Worried About Charity fundraiser in the Last Year:   .  Ran Out of Food in the Last Year:   Transportation Needs:   . Film/video editor (Medical):   Marland Kitchen Lack of Transportation (Non-Medical):   Physical Activity:   . Days of Exercise per Week:   . Minutes of Exercise per Session:   Stress:   . Feeling of Stress :   Social Connections:   . Frequency of Communication with Friends and Family:   . Frequency of Social Gatherings with Friends and Family:   . Attends Religious Services:   . Active Member of Clubs or Organizations:   . Attends Archivist Meetings:   Marland Kitchen Marital Status:    Past Surgical History:  Procedure Laterality Date  . ABDOMINAL HYSTERECTOMY  1982   DUB; ovaries intact  . bladder surg  1992   bladder retraction  . BREAST BIOPSY    . BREAST REDUCTION SURGERY  1984  . CARDIAC CATHETERIZATION  1990's   [name] [name]  . COLON SURGERY  05/11/11   [facility] of adhesions Crohn's  . MASS EXCISION Left 01/03/2018   Procedure: EXCISION  LEFT UPPER BACK LIPOMA ERAS PATH;  Surgeon: Erroll Luna, MD;  Location: [facility];  Service: General;  Laterality: Left;  . REDUCTION MAMMAPLASTY    . SPINE SURGERY     5 total (1 upper, 4 lumbar)   Past Medical History:  Diagnosis Date  . Anemia   . Blood transfusion without reported diagnosis   . Crohn's disease ([place])   . Disorder of sacroiliac joint   . Hyperlipidemia   . Hypertension   . Ischemic colitis ([place])   . Lumbago   . Lumbar post-laminectomy syndrome   . Lumbosacral neuritis   . Lumbosacral radiculitis   . Sciatica    Temp (!) 97.5 F (36.4 C)   Ht 5\' 4"  (1.626 m)   Wt 214 lb (97.1 kg)   BMI 36.73 kg/m   Opioid Risk Score:   Fall Risk Score:  `1  Depression screen PHQ 2/9  Depression screen [facility] 2/9 04/18/2019 04/15/2019 09/27/2018 08/24/2018 05/08/2018 03/07/2018 10/18/2017  Decreased Interest 0 0 0 0 0 0 0  Down, Depressed, Hopeless 0 0 0 0 0 0 0  PHQ - 2 Score 0 0 0 0 0 0 0  Altered sleeping - - - 0 - - -  Tired, decreased energy - - - 0 - - -  Change in appetite - - - 0 - - -  Feeling bad or failure about yourself  - - - 0 - - -  Trouble concentrating - - - 0 - - -  Moving slowly or fidgety/restless - - - 0 - - -  Suicidal thoughts - - - 0 - - -  PHQ-9 Score - - - 0 - - -  Difficult doing work/chores - - - Not difficult at all - - -  Some recent data might be hidden    Review of Systems  Constitutional: Positive for unexpected weight change.  HENT: Negative.   Eyes: Negative.   Respiratory: Negative.   Cardiovascular: Negative.   Gastrointestinal: Negative.   Endocrine: Negative.   Genitourinary: Negative.   Musculoskeletal: Positive for back pain and gait problem.  Skin: Negative.   Allergic/Immunologic: Negative.   Neurological: Positive for weakness.  Hematological: Negative.   Psychiatric/Behavioral: Negative.   All other systems reviewed and are negative.      Objective:   Physical Exam Vitals and nursing note  reviewed.  Constitutional:  Appearance: Normal appearance.  Cardiovascular:     Rate and Rhythm: Normal rate and regular rhythm.     Pulses: Normal pulses.     Heart sounds: Normal heart sounds.  Pulmonary:     Effort: Pulmonary effort is normal.     Breath sounds: Normal breath sounds.  Musculoskeletal:     Cervical back: Normal range of motion and neck supple.     Comments: Normal Muscle Bulk and Muscle Testing Reveals:  Upper Extremities: Full ROM and Muscle Strength 5/5  Lumbar Hypersensitivity Lower Extremities: Right: Decreased ROM and Muscle Strength 4/5 Left: Full ROM and Muscle Strength 5/5 Arises from Table Slowly using cane for support Antalgic  Gait   Skin:    General: Skin is warm and dry.  Neurological:     Mental Status: She is alert and oriented to person, place, and time.  Psychiatric:        Mood and Affect: Mood normal.        Behavior: Behavior normal.           Assessment & Plan:  1.Lumbar Postlaminectomy/ Spinal Stenosis Lumbar Region/ Post-Op Pain/Low back pain with radiating symptoms in L3-4 distribution: continues to be limited by pain. Refilled:MSIR 15 mg one tablet twice a day as needed for moderate to sever pain#60andMorphine Sulfate ER15 mg Q 8 hours.09/11/2019. We will continue the opioid monitoring program, this consists of regular clinic visits, examinations, urine drug screen, pill counts as well as use of [place] Controlled Substance Reporting System. Encouraged to Continue exercise regime.  2. Lumbar Radiculopathy:ContinueTopamax:.Continue to Monitor03/24/2021 3.ChronicRight Knee Pain: No complaints today.  Continue with Ice TherapyandVoltaren Gel.09/11/2019 4. Muscle Spasm: Continuecurrent treatment: Tizanidine.03//24/2021 5. Lipoma: S/P Left Lipoma Excision of upper back By Dr. Brantley Stage on 01/03/2018. Surgery Following. Continue to Monitor.09/11/2019. 6. Paresthesia of Skin:  ContinueTopamax.09/11/2019 7. Adhesive Capsulitis of Right Shoulder: S/PU/S Cortisoneinjectionon 01/13/2020with some relief noted.Continue to monitor. Continue HEP as Tolerated.09/11/2019   38minutes of face to face patient care time was spent during this visit. All questions were encouraged and answered.  F/U in 1 month

## 2019-09-14 LAB — DRUG TOX MONITOR 1 W/CONF, ORAL FLD
Amphetamines: NEGATIVE ng/mL (ref <10)
Barbiturates: NEGATIVE ng/mL (ref <10)
Benzodiazepines: NEGATIVE ng/mL (ref <0.50)
Buprenorphine: NEGATIVE ng/mL (ref <0.10)
Cocaine: NEGATIVE ng/mL (ref <5.0)
Codeine: NEGATIVE ng/mL (ref <2.5)
Cotinine: 82.6 ng/mL — ABNORMAL HIGH (ref <5.0)
Dihydrocodeine: NEGATIVE ng/mL (ref <2.5)
Fentanyl: NEGATIVE ng/mL (ref <0.10)
Heroin Metabolite: NEGATIVE ng/mL (ref <1.0)
Hydrocodone: NEGATIVE ng/mL (ref <2.5)
Hydromorphone: NEGATIVE ng/mL (ref <2.5)
MARIJUANA: NEGATIVE ng/mL (ref <2.5)
MDMA: NEGATIVE ng/mL (ref <10)
Meprobamate: NEGATIVE ng/mL (ref <2.5)
Methadone: NEGATIVE ng/mL (ref <5.0)
Morphine: 29.1 ng/mL — ABNORMAL HIGH (ref <2.5)
Nicotine Metabolite: POSITIVE ng/mL — AB (ref <5.0)
Norhydrocodone: NEGATIVE ng/mL (ref <2.5)
Noroxycodone: NEGATIVE ng/mL (ref <2.5)
Opiates: POSITIVE ng/mL — AB (ref <2.5)
Oxycodone: NEGATIVE ng/mL (ref <2.5)
Oxymorphone: NEGATIVE ng/mL (ref <2.5)
Phencyclidine: NEGATIVE ng/mL (ref <10)
Tapentadol: NEGATIVE ng/mL (ref <5.0)
Tramadol: NEGATIVE ng/mL (ref <5.0)
Zolpidem: NEGATIVE ng/mL (ref <5.0)

## 2019-09-14 LAB — DRUG TOX ALC METAB W/CON, ORAL FLD: Alcohol Metabolite: NEGATIVE ng/mL (ref <25)

## 2019-09-17 DIAGNOSIS — Z981 Arthrodesis status: Secondary | ICD-10-CM | POA: Diagnosis not present

## 2019-09-17 DIAGNOSIS — M48061 Spinal stenosis, lumbar region without neurogenic claudication: Secondary | ICD-10-CM | POA: Diagnosis not present

## 2019-09-17 DIAGNOSIS — M545 Low back pain: Secondary | ICD-10-CM | POA: Diagnosis not present

## 2019-09-24 ENCOUNTER — Telehealth: Payer: Self-pay

## 2019-09-24 NOTE — Telephone Encounter (Signed)
UDS RESULTS CONSISTENT WITH MEDICATIONS ON FILE  

## 2019-09-26 DIAGNOSIS — Z981 Arthrodesis status: Secondary | ICD-10-CM | POA: Diagnosis not present

## 2019-09-26 DIAGNOSIS — M5136 Other intervertebral disc degeneration, lumbar region: Secondary | ICD-10-CM | POA: Diagnosis not present

## 2019-10-14 ENCOUNTER — Encounter: Payer: Self-pay | Admitting: Registered Nurse

## 2019-10-14 ENCOUNTER — Other Ambulatory Visit: Payer: Self-pay

## 2019-10-14 ENCOUNTER — Encounter: Payer: Medicare Other | Attending: Physical Medicine & Rehabilitation | Admitting: Registered Nurse

## 2019-10-14 ENCOUNTER — Other Ambulatory Visit: Payer: Self-pay | Admitting: Family Medicine

## 2019-10-14 VITALS — BP 154/79 | HR 69 | Temp 97.7°F | Ht 64.0 in | Wt 219.8 lb

## 2019-10-14 DIAGNOSIS — M25551 Pain in right hip: Secondary | ICD-10-CM | POA: Diagnosis not present

## 2019-10-14 DIAGNOSIS — G894 Chronic pain syndrome: Secondary | ICD-10-CM

## 2019-10-14 DIAGNOSIS — M5416 Radiculopathy, lumbar region: Secondary | ICD-10-CM

## 2019-10-14 DIAGNOSIS — M961 Postlaminectomy syndrome, not elsewhere classified: Secondary | ICD-10-CM

## 2019-10-14 DIAGNOSIS — Z79891 Long term (current) use of opiate analgesic: Secondary | ICD-10-CM

## 2019-10-14 DIAGNOSIS — M7071 Other bursitis of hip, right hip: Secondary | ICD-10-CM

## 2019-10-14 DIAGNOSIS — Z5181 Encounter for therapeutic drug level monitoring: Secondary | ICD-10-CM

## 2019-10-14 DIAGNOSIS — G8929 Other chronic pain: Secondary | ICD-10-CM | POA: Insufficient documentation

## 2019-10-14 DIAGNOSIS — M4807 Spinal stenosis, lumbosacral region: Secondary | ICD-10-CM | POA: Diagnosis not present

## 2019-10-14 DIAGNOSIS — M62838 Other muscle spasm: Secondary | ICD-10-CM

## 2019-10-14 DIAGNOSIS — M5417 Radiculopathy, lumbosacral region: Secondary | ICD-10-CM | POA: Diagnosis not present

## 2019-10-14 DIAGNOSIS — M5414 Radiculopathy, thoracic region: Secondary | ICD-10-CM | POA: Diagnosis not present

## 2019-10-14 MED ORDER — MORPHINE SULFATE 15 MG PO TABS: 15.0000 mg | ORAL_TABLET | Freq: Two times a day (BID) | ORAL | 0 refills | Status: DC | PRN

## 2019-10-14 MED ORDER — TIZANIDINE HCL 2 MG PO TABS: 2.0000 mg | ORAL_TABLET | Freq: Two times a day (BID) | ORAL | 2 refills | Status: DC | PRN

## 2019-10-14 MED ORDER — MORPHINE SULFATE ER 15 MG PO T12A: 1.0000 | EXTENDED_RELEASE_TABLET | Freq: Three times a day (TID) | ORAL | 0 refills | Status: DC

## 2019-10-14 NOTE — Progress Notes (Addendum)
Subjective:    Patient ID: [name] [name], female    DOB: 1955/07/03, 64 y.o.   MRN: [id number]  HPI: [name] [name] is a 64 y.o. female who returns for follow up appointment for chronic pain and medication refill. She states her pain is located in her lower back radiating into her right buttock ( ischial tuberosity) and right lower extremity. She rates her pain 10. Her current exercise regime is walking.   Ms. Scites express interest in a spinal stimulator and discussed it with Dr. Velna Ochs, she reports Dr Velna Ochs doesn't place spinal stimulator. The above will be discussed with Dr Letta Pate, she verbalizes understanding.   This provider spoke with Dr Letta Pate he is in agreement with referral to Dr Maryjean Ka. Referral will be placed today. Ms. Kies called regarding the above and verbalizes understanding.   Ms. Horlick Morphine equivalent is 75.00  MME.  Last Oral Swab was Performed on 09/11/2019, it was consistent.   Pain Inventory Average Pain 10 Pain Right Now 10 My pain is constant, sharp, burning, stabbing, tingling and aching  In the last 24 hours, has pain interfered with the following? General activity 10 Relation with others 10 Enjoyment of life 10 What TIME of day is your pain at its worst? all Sleep (in general) Poor  Pain is worse with: sitting and inactivity Pain improves with: medication Relief from Meds: 1  Mobility use a cane ability to climb steps?  yes do you drive?  yes  Function disabled: date disabled 10/2007 I need assistance with the following:  meal prep, household duties and shopping  Neuro/Psych trouble walking spasms  Prior Studies Any changes since last visit?  no  Physicians involved in your care Any changes since last visit?  no   Family History  Problem Relation Age of Onset  . Heart disease Mother 47       CHF; CAD  . Hypertension Mother   . Heart failure Mother   . Diabetes Sister   . Lupus Sister   . Kidney failure Sister    . Hypertension Brother   . Hyperlipidemia Sister   . Hypertension Sister   . Colon cancer Neg Hx   . Rectal cancer Neg Hx    Social History   Socioeconomic History  . Marital status: Widowed    Spouse name: Not on file  . Number of children: 3  . Years of education: Not on file  . Highest education level: Not on file  Occupational History  . 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 Smoking status: Former Smoker    Packs/day: 0.50    Years: 20.00    Pack years: 10.00    Types: Cigarettes    Quit date: 07/21/2008    Years since quitting: 11.2  . Smokeless tobacco: Never Used  Substance and Sexual Activity  . Alcohol use: No  . Drug use: No  . Sexual activity: Not on file  Other Topics Concern  . Not on file  Social History Narrative   Marital status: widowed since 2002; not dating which is bad in 2018      Children: 3 children (45, 4, 39); 13 grandchildren; 1 gg      Employment: disability since 2009; retail      Lives: alone in [place] in apartment      Tobacco: quit in 2011      Alcohol: none      Drugs: none  Exercise: minimal in 2018      ADLs: independent with ADLs; drives      Seatbelt: 100%; no texting.   Social Determinants of Health   Financial Resource Strain:   . Difficulty of Paying Living Expenses:   Food Insecurity:   . Worried About Charity fundraiser in the Last Year:   . Arboriculturist in the Last Year:   Transportation Needs:   . Film/video editor (Medical):   Marland Kitchen Lack of Transportation (Non-Medical):   Physical Activity:   . Days of Exercise per Week:   . Minutes of Exercise per Session:   Stress:   . Feeling of Stress :   Social Connections:   . Frequency of Communication with Friends and Family:   . Frequency of Social Gatherings with Friends and Family:   . Attends Religious Services:   . Active Member of Clubs or Organizations:   . Attends Archivist Meetings:   Marland Kitchen Marital  Status:    Past Surgical History:  Procedure Laterality Date  . ABDOMINAL HYSTERECTOMY  1982   DUB; ovaries intact  . bladder surg  1992   bladder retraction  . BREAST BIOPSY    . BREAST REDUCTION SURGERY  1984  . CARDIAC CATHETERIZATION  1990's   [name]  . COLON SURGERY  05/11/11   [facility] L[facility] of adhesions Crohn's  . MASS EXCISION Left 01/03/2018   Procedure: EXCISION LEFT UPPER BACK LIPOMA ERAS PATH;  Surgeon: Erroll Luna, MD;  Location: [facility];  Service: General;  Laterality: Left;  . REDUCTION MAMMAPLASTY    . SPINE SURGERY     5 total (1 upper, 4 lumbar)   Past Medical History:  Diagnosis Date  . Anemia   . Blood transfusion without reported diagnosis   . Crohn's disease ([place])   . Disorder of sacroiliac joint   . Hyperlipidemia   . Hypertension   . Ischemic colitis ([place])   . Lumbago   . Lumbar post-laminectomy syndrome   . Lumbosacral neuritis   . Lumbosacral radiculitis   . Sciatica    BP (!) 154/79   Pulse 69   Temp 97.7 F (36.5 C)   Ht 5\' 4"  (1.626 m)   Wt 219 lb 12.8 oz (99.7 kg)   SpO2 95%   BMI 37.73 kg/m   Opioid Risk Score:   Fall Risk Score:  `1  Depression screen PHQ 2/9  Depression screen [facility] 2/9 10/14/2019 04/18/2019 04/15/2019 09/27/2018 08/24/2018 05/08/2018 03/07/2018  Decreased Interest 0 0 0 0 0 0 0  Down, Depressed, Hopeless 0 0 0 0 0 0 0  PHQ - 2 Score 0 0 0 0 0 0 0  Altered sleeping - - - - 0 - -  Tired, decreased energy - - - - 0 - -  Change in appetite - - - - 0 - -  Feeling bad or failure about yourself  - - - - 0 - -  Trouble concentrating - - - - 0 - -  Moving slowly or fidgety/restless - - - - 0 - -  Suicidal thoughts - - - - 0 - -  PHQ-9 Score - - - - 0 - -  Difficult doing work/chores - - - - Not difficult at all - -  Some recent data might be hidden    Review of Systems  Constitutional: Negative.   HENT: Negative.   Eyes: Negative.   Respiratory: Negative.   Cardiovascular:  Negative.     Gastrointestinal: Negative.   Endocrine: Negative.   Genitourinary: Negative.   Musculoskeletal: Positive for gait problem.       Spasms  Skin: Negative.   Allergic/Immunologic: Negative.   Hematological: Negative.   Psychiatric/Behavioral: Negative.   All other systems reviewed and are negative.      Objective:   Physical Exam Vitals and nursing note reviewed.  Constitutional:      Appearance: Normal appearance.  Cardiovascular:     Rate and Rhythm: Normal rate and regular rhythm.     Pulses: Normal pulses.     Heart sounds: Normal heart sounds.  Pulmonary:     Effort: Pulmonary effort is normal.     Breath sounds: Normal breath sounds.  Musculoskeletal:     Cervical back: Normal range of motion and neck supple.     Comments: Normal Muscle Bulk and Muscle Testing Reveals:  Upper Extremities: Full ROM and Muscle Strength 5/5  Lumbar Hypersensitivity Mainly Right Side  Lower Extremities: Right: Decreased ROM and Muscle Strength 4/5 Right Lower Extremity Flexion Produces Pain into her Lumbar, Ischial  Tuberosity and Right Lower Extremity  Left: Full ROM and Muscle Strength 5/5 Arises from Table Slowly using cane for support Antalgic Gait   Skin:    General: Skin is warm and dry.  Neurological:     Mental Status: She is alert and oriented to person, place, and time.  Psychiatric:        Mood and Affect: Mood normal.        Behavior: Behavior normal.           Assessment & Plan:  1.Lumbar Postlaminectomy/ Spinal Stenosis Lumbar Region/ Post-Op Pain/Low back pain with radiating symptoms in L3-4 distribution: continues to be limited by pain. Refilled:MSIR 15 mg one tablet twice a day as needed for moderate to sever pain#60andMorphine Sulfate ER15 mg Q 8 hours.10/14/2019. We will continue the opioid monitoring program, this consists of regular clinic visits, examinations, urine drug screen, pill counts as well as use of [place] Controlled Substance Reporting  System. Encouraged to Continue exercise regime.  2. Lumbar Radiculopathy:ContinueTopamax:.Continue to Monitor04/26/2021 3.ChronicRight Knee Pain:No complaints today.Continue with Ice TherapyandVoltaren Gel.04/246/2021 4. Muscle Spasm: Continuecurrent treatment: Tizanidine.04//26/2021 5. Lipoma: S/P Left Lipoma Excision of upper back By Dr. Brantley Stage on 01/03/2018. Surgery Following. Continue to Monitor.10/14/2019. 6. Paresthesia of Skin: ContinueTopamax.10/14/2019 7. Adhesive Capsulitis of Right Shoulder: S/PU/S Cortisoneinjectionon 01/13/2020with some relief noted.Continue to monitor. Continue HEP as Tolerated.10/14/2019   39minutes of face to face patient care time was spent during this visit. All questions were encouraged and answered.  F/U in 1 month

## 2019-10-14 NOTE — Telephone Encounter (Signed)
10/14/2019  Left a voicemail to call for an appt for further refills.

## 2019-10-15 NOTE — Addendum Note (Signed)
Addended by: Bayard Hugger on: 10/15/2019 01:35 PM   Modules accepted: Orders

## 2019-10-17 ENCOUNTER — Telehealth: Payer: Self-pay | Admitting: *Deleted

## 2019-10-17 MED ORDER — MORPHINE SULFATE ER 15 MG PO TBCR: 15.0000 mg | EXTENDED_RELEASE_TABLET | Freq: Three times a day (TID) | ORAL | 0 refills | Status: DC

## 2019-10-17 NOTE — Telephone Encounter (Signed)
Ms Durden says that [facility] insists that [place] sent in Rx for Morphabond. Ms Bernardo request Enice please call them. #[phone].

## 2019-10-17 NOTE — Telephone Encounter (Signed)
Placed a call to [facility] and spoke with Pharmacist supervisor. She reports they were audited and [name] [name] prescription has to be [facility]. Explained to the pharmacist if she would review PMP we have been writing her prescription this way for over a year. She verbalizes understanding. New prescription sent. This provider placed a call to [name] [name] regarding the above, she verbalizes understanding.

## 2019-10-23 ENCOUNTER — Telehealth: Payer: Self-pay

## 2019-10-23 NOTE — Telephone Encounter (Signed)
[name] [name] [name] [name] called and stated she has heard nothing from Neuro referral as of lunch yesterday - you asked her to call and let you know.  Her number [phone]

## 2019-10-23 NOTE — Telephone Encounter (Signed)
Return Ms. Ninneman call, no answer, left message to return the call.

## 2019-10-23 NOTE — Telephone Encounter (Signed)
Patient called stating that she has an appointment with Dr. Maryjean Ka on June 9 th.

## 2019-10-26 ENCOUNTER — Other Ambulatory Visit: Payer: Self-pay | Admitting: Physical Medicine & Rehabilitation

## 2019-10-30 NOTE — Assessment & Plan Note (Addendum)
Stable.  BP at goal. Continue current medications.  Medications: Chlorthalidone 25mg , Potassium 27mEq  Cardiovascular risk factors: dyslipidemia, obesity (BMI >= 30 kg/m2) and sedentary lifestyle.  Reviewed previous BMP, lipid panel within the last year  Preventitive Healthcare:  Exercise: no

## 2019-10-30 NOTE — Progress Notes (Signed)
SUBJECTIVE:   CHIEF COMPLAINT / HPI:   [name] [name] is a 64 y.o. female presents for follow up.   Chronic HTN Patient here for follow-up of  hypertension. Takes Chlorthalidone and potassium. Denies missing doses of antihypertensive medication. Denies chest pain, palpitations, exertional dyspnea, lightheadedness, headaches and vision changes. Reports some extremity edema that is worse at the end of the day.  Home BP Monitoring:  On the 4/26 when she was in pain SBP reached 152. States that was the highest it ever gotten.   Prediabetes States she doesn't have an appetitie to eat.  Social eater mostly. Lives with her sister and her sister reminds her to eat.    HLD Patient taking Lipitor. Denies missing any doses. Reports no medication side effects.  Chronic Pain  Sitting upright makes back pain worse.  Pain described as  burning, stinging and throbbing. Has pain in the  gluteal region described as tender and sore "like someone picked me up and dropped me".  Follows with PM&R.  Reports having to lean to sit upright and has to keep shifting her position to get comfortable.   PERTINENT  PMH / PSH: HTN, Allergic rhinitis, Hypokalemia, Chronic pain, prediabetes, DDD, DJD   OBJECTIVE:   BP 110/68   Pulse 73   Ht 5\' 4"  (1.626 m)   Wt 216 lb 8 oz (98.2 kg)   SpO2 97%   BMI 37.16 kg/m    GEN: well appearing female, in no acute distress  CV: regular rate and rhythm, no murmurs appreciated  RESP: no increased work of breathing, clear to ascultation bilaterally MSK: has cane, gait not assessed, limited rotation, flexion and extension of T and L spine secondary to pain, mild woody edema to bilateral lower extremity edema  SKIN: warm, dry, normal tugor NEURO: alert and oriented     ASSESSMENT/PLAN:   Essential hypertension Stable.  BP at goal. Continue current medications.  Medications: Chlorthalidone 25mg , Potassium 87mEq  Cardiovascular risk factors: dyslipidemia, obesity  (BMI >= 30 kg/m2) and sedentary lifestyle.  Reviewed previous BMP, lipid panel within the last year  Preventitive Healthcare:  Exercise: no      Chronic pain Follows with PM&R, Mr. Danella Sensing, NP for pain control. Defer treatment to him.   Depression due to physical illness Depression screen [facility] 2/9 11/01/2019 10/14/2019 04/18/2019  Decreased Interest 3 0 0  Down, Depressed, Hopeless 1 0 0  PHQ - 2 Score 4 0 0  Altered sleeping 3 - -  Tired, decreased energy 3 - -  Change in appetite 3 - -  Feeling bad or failure about yourself  1 - -  Trouble concentrating 0 - -  Moving slowly or fidgety/restless 0 - -  Suicidal thoughts - - -  PHQ-9 Score 14 - -  Difficult doing work/chores Extremely dIfficult - -  Some recent data might be hidden   Patient declines treatment at this time.  She is hopeful that the back stimulator will help her regain mobility.  She loves to cook and attend church but has not been able to stand for long periods of time due to chronic pain.    Lower extremity edema Will bring patient back to complete workup.  No known hx of heart failure.    Pre-diabetes Continue current regimen: None.  A1c today 5.8 and previously 5.8. Encouraged continued diet rich in vegetables and complex carbs.  Heart healthy carb modified diet. Counseled on need to exercise as tolerated.  Statin  therapy: Lipitor 20mg          Lyndee Hensen, [place]

## 2019-10-31 ENCOUNTER — Encounter: Payer: Self-pay | Admitting: Family Medicine

## 2019-10-31 ENCOUNTER — Ambulatory Visit (INDEPENDENT_AMBULATORY_CARE_PROVIDER_SITE_OTHER): Payer: Medicare Other | Admitting: Family Medicine

## 2019-10-31 ENCOUNTER — Telehealth: Payer: Self-pay | Admitting: Family Medicine

## 2019-10-31 ENCOUNTER — Other Ambulatory Visit: Payer: Self-pay

## 2019-10-31 VITALS — BP 110/68 | HR 73 | Ht 64.0 in | Wt 216.5 lb

## 2019-10-31 DIAGNOSIS — I10 Essential (primary) hypertension: Secondary | ICD-10-CM

## 2019-10-31 DIAGNOSIS — E785 Hyperlipidemia, unspecified: Secondary | ICD-10-CM | POA: Diagnosis not present

## 2019-10-31 DIAGNOSIS — R600 Localized edema: Secondary | ICD-10-CM | POA: Diagnosis not present

## 2019-10-31 DIAGNOSIS — R7303 Prediabetes: Secondary | ICD-10-CM

## 2019-10-31 DIAGNOSIS — E876 Hypokalemia: Secondary | ICD-10-CM | POA: Diagnosis not present

## 2019-10-31 DIAGNOSIS — F0631 Mood disorder due to known physiological condition with depressive features: Secondary | ICD-10-CM

## 2019-10-31 DIAGNOSIS — G894 Chronic pain syndrome: Secondary | ICD-10-CM

## 2019-10-31 LAB — POCT GLYCOSYLATED HEMOGLOBIN (HGB A1C): HbA1c, POC (controlled diabetic range): 5.8 % (ref 0.0–7.0)

## 2019-10-31 MED ORDER — CHLORTHALIDONE 25 MG PO TABS: 12.5000 mg | ORAL_TABLET | Freq: Every day | ORAL | 0 refills | Status: DC

## 2019-10-31 MED ORDER — FUROSEMIDE 20 MG PO TABS: 20.0000 mg | ORAL_TABLET | Freq: Every day | ORAL | 3 refills | Status: DC

## 2019-10-31 NOTE — Telephone Encounter (Signed)
Spoke with patient and told her not to take Lasix.  She is to make an appointment to further evaluate her lower extremity edema.

## 2019-10-31 NOTE — Patient Instructions (Signed)
It was great seeing you today!  Please check-out at the front desk before leaving the clinic. I'd like to see you back in 6 months but if you need to be seen earlier than that for any new issues we're happy to fit you in, just give [place] a call!  Visit Remembers: - Stop by the pharmacy to pick up your prescriptions  - Continue to work on your healthy eating habits and incorporating exercise into your daily life. - Your BP is at goal.  Keep taking your medications.  - Medicine Changes: Added Lasix (Furosemide) take 20mg  daily for lower leg swelling.   - To Do: get eye exam  Call us if: short of breath, chest pain, lightheaded or dizziness    Regarding lab work today:  Due to recent changes in healthcare laws, you may see the results of your imaging and laboratory studies on MyChart before your provider has had a chance to review them.  I understand that in some cases there may be results that are confusing or concerning to you. Not all laboratory results come back in the same time frame and you may be waiting for multiple results in order to interpret others.  Please give [place] 72 hours in order for your provider to thoroughly review all the results before contacting the office for clarification of your results. If everything is normal, you will get a letter in the mail or a message in My Chart. Please give [place] a call if you do not hear from [place] after 2 weeks.  Please bring all of your medications with you to each visit.    If you haven't already, sign up for My Chart to have easy access to your labs results, and communication with your primary care physician.  Feel free to call with any questions or concerns at any time, at [phone].   Take care,  Dr. Rushie [facility]

## 2019-11-01 DIAGNOSIS — F0631 Mood disorder due to known physiological condition with depressive features: Secondary | ICD-10-CM | POA: Insufficient documentation

## 2019-11-01 LAB — LDL CHOLESTEROL, DIRECT: LDL Direct: 107 mg/dL — ABNORMAL HIGH (ref 0–99)

## 2019-11-01 NOTE — Assessment & Plan Note (Signed)
Depression screen [facility] 2/9 11/01/2019 10/14/2019 04/18/2019  Decreased Interest 3 0 0  Down, Depressed, Hopeless 1 0 0  PHQ - 2 Score 4 0 0  Altered sleeping 3 - -  Tired, decreased energy 3 - -  Change in appetite 3 - -  Feeling bad or failure about yourself  1 - -  Trouble concentrating 0 - -  Moving slowly or fidgety/restless 0 - -  Suicidal thoughts - - -  PHQ-9 Score 14 - -  Difficult doing work/chores Extremely dIfficult - -  Some recent data might be hidden   Patient declines treatment at this time.  She is hopeful that the back stimulator will help her regain mobility.  She loves to cook and attend church but has not been able to stand for long periods of time due to chronic pain.

## 2019-11-01 NOTE — Assessment & Plan Note (Deleted)
Stable

## 2019-11-01 NOTE — Assessment & Plan Note (Signed)
Follows with PM&R, Mr. Danella Sensing, NP for pain control. Defer treatment to him.

## 2019-11-07 ENCOUNTER — Encounter: Payer: Self-pay | Admitting: Family Medicine

## 2019-11-07 ENCOUNTER — Other Ambulatory Visit: Payer: Self-pay

## 2019-11-07 ENCOUNTER — Ambulatory Visit (INDEPENDENT_AMBULATORY_CARE_PROVIDER_SITE_OTHER): Payer: Medicare Other | Admitting: Family Medicine

## 2019-11-07 VITALS — BP 124/76 | HR 73 | Ht 64.0 in | Wt 217.6 lb

## 2019-11-07 DIAGNOSIS — R600 Localized edema: Secondary | ICD-10-CM

## 2019-11-07 NOTE — Patient Instructions (Signed)
It was great seeing you today!   Regarding lab work today:  Due to recent changes in healthcare laws, you may see the results of your imaging and laboratory studies on MyChart before your provider has had a chance to review them.  I understand that in some cases there may be results that are confusing or concerning to you. Not all laboratory results come back in the same time frame and you may be waiting for multiple results in order to interpret others.  Please give [place] 72 hours in order for your provider to thoroughly review all the results before contacting the office for clarification of your results. If everything is normal, you will get a letter in the mail or a message in My Chart. Please give [place] a call if you do not hear from [place] after 2 weeks.  Please bring all of your medications with you to each visit.    If you haven't already, sign up for My Chart to have easy access to your labs results, and communication with your primary care physician.  Feel free to call with any questions or concerns at any time, at [phone].   Take care,  Dr. Rushie [facility]

## 2019-11-07 NOTE — Progress Notes (Signed)
Elevated PHQ-2. PHQ-9 read aloud. Patient scores 15. Patient reports elevated scores due to constant back pain. Notified provider.   Talbot Grumbling, RN

## 2019-11-08 LAB — COMPREHENSIVE METABOLIC PANEL
ALT: 21 IU/L (ref 0–32)
AST: 24 IU/L (ref 0–40)
Albumin/Globulin Ratio: 1.8 (ref 1.2–2.2)
Albumin: 4.8 g/dL (ref 3.8–4.8)
Alkaline Phosphatase: 73 IU/L (ref 48–121)
BUN/Creatinine Ratio: 11 — ABNORMAL LOW (ref 12–28)
BUN: 11 mg/dL (ref 8–27)
Bilirubin Total: 0.9 mg/dL (ref 0.0–1.2)
CO2: 27 mmol/L (ref 20–29)
Calcium: 9.5 mg/dL (ref 8.7–10.3)
Chloride: 100 mmol/L (ref 96–106)
Creatinine, Ser: 1.02 mg/dL — ABNORMAL HIGH (ref 0.57–1.00)
GFR calc Af Amer: 68 mL/min/{1.73_m2} (ref >59)
GFR calc non Af Amer: 59 mL/min/{1.73_m2} — ABNORMAL LOW (ref >59)
Globulin, Total: 2.7 g/dL (ref 1.5–4.5)
Glucose: 93 mg/dL (ref 65–99)
Potassium: 3.9 mmol/L (ref 3.5–5.2)
Sodium: 141 mmol/L (ref 134–144)
Total Protein: 7.5 g/dL (ref 6.0–8.5)

## 2019-11-08 LAB — CBC
Hematocrit: 38.8 % (ref 34.0–46.6)
Hemoglobin: 12.2 g/dL (ref 11.1–15.9)
MCH: 26.3 pg — ABNORMAL LOW (ref 26.6–33.0)
MCHC: 31.4 g/dL — ABNORMAL LOW (ref 31.5–35.7)
MCV: 84 fL (ref 79–97)
Platelets: 271 10*3/uL (ref 150–450)
RBC: 4.64 x10E6/uL (ref 3.77–5.28)
RDW: 13.1 % (ref 11.7–15.4)
WBC: 5.3 10*3/uL (ref 3.4–10.8)

## 2019-11-08 LAB — TSH: TSH: 2.65 u[IU]/mL (ref 0.450–4.500)

## 2019-11-08 LAB — BRAIN NATRIURETIC PEPTIDE: BNP: 10.1 pg/mL (ref 0.0–100.0)

## 2019-11-10 DIAGNOSIS — R600 Localized edema: Secondary | ICD-10-CM | POA: Insufficient documentation

## 2019-11-10 NOTE — Assessment & Plan Note (Signed)
Continue current regimen: None.  A1c today 5.8 and previously 5.8. Encouraged continued diet rich in vegetables and complex carbs.  Heart healthy carb modified diet. Counseled on need to exercise as tolerated.  Statin therapy: Lipitor 20mg 

## 2019-11-10 NOTE — Assessment & Plan Note (Signed)
Will bring patient back to complete workup.  No known hx of heart failure.

## 2019-11-10 NOTE — Assessment & Plan Note (Signed)
Continue current mediations: Lipitor 20 mg  Recent lipid panel reviewed.  Recheck LDL today.  Previously 175.

## 2019-11-11 ENCOUNTER — Ambulatory Visit
Admission: RE | Admit: 2019-11-11 | Discharge: 2019-11-11 | Disposition: A | Discharge status: Home / Self Care | Payer: Medicare Other | Source: Ambulatory Visit | Attending: Family Medicine | Admitting: Family Medicine

## 2019-11-11 ENCOUNTER — Ambulatory Visit (HOSPITAL_COMMUNITY)
Admission: RE | Admit: 2019-11-11 | Discharge: 2019-11-11 | Disposition: A | Discharge status: Home / Self Care | Payer: Medicare Other | Source: Ambulatory Visit | Attending: Family Medicine | Admitting: Family Medicine

## 2019-11-11 ENCOUNTER — Other Ambulatory Visit: Payer: Self-pay

## 2019-11-11 DIAGNOSIS — I429 Cardiomyopathy, unspecified: Secondary | ICD-10-CM | POA: Diagnosis not present

## 2019-11-11 DIAGNOSIS — R600 Localized edema: Secondary | ICD-10-CM | POA: Diagnosis not present

## 2019-11-11 DIAGNOSIS — E785 Hyperlipidemia, unspecified: Secondary | ICD-10-CM | POA: Insufficient documentation

## 2019-11-11 DIAGNOSIS — Z87891 Personal history of nicotine dependence: Secondary | ICD-10-CM | POA: Diagnosis not present

## 2019-11-11 DIAGNOSIS — I10 Essential (primary) hypertension: Secondary | ICD-10-CM | POA: Insufficient documentation

## 2019-11-11 NOTE — Progress Notes (Signed)
  Echocardiogram 2D Echocardiogram has been performed.  [name] [name] 11/11/2019, 12:21 PM

## 2019-11-13 ENCOUNTER — Other Ambulatory Visit: Payer: Self-pay

## 2019-11-13 ENCOUNTER — Encounter: Payer: Medicare Other | Attending: Physical Medicine & Rehabilitation | Admitting: Registered Nurse

## 2019-11-13 ENCOUNTER — Telehealth: Payer: Self-pay

## 2019-11-13 ENCOUNTER — Encounter: Payer: Self-pay | Admitting: Registered Nurse

## 2019-11-13 VITALS — BP 137/84 | HR 60 | Temp 98.7°F | Ht 64.0 in | Wt 217.4 lb

## 2019-11-13 DIAGNOSIS — M5414 Radiculopathy, thoracic region: Secondary | ICD-10-CM | POA: Insufficient documentation

## 2019-11-13 DIAGNOSIS — M7071 Other bursitis of hip, right hip: Secondary | ICD-10-CM | POA: Diagnosis not present

## 2019-11-13 DIAGNOSIS — Z79891 Long term (current) use of opiate analgesic: Secondary | ICD-10-CM | POA: Diagnosis not present

## 2019-11-13 DIAGNOSIS — M25551 Pain in right hip: Secondary | ICD-10-CM | POA: Diagnosis not present

## 2019-11-13 DIAGNOSIS — M4807 Spinal stenosis, lumbosacral region: Secondary | ICD-10-CM

## 2019-11-13 DIAGNOSIS — G8929 Other chronic pain: Secondary | ICD-10-CM | POA: Insufficient documentation

## 2019-11-13 DIAGNOSIS — G894 Chronic pain syndrome: Secondary | ICD-10-CM | POA: Diagnosis not present

## 2019-11-13 DIAGNOSIS — M5417 Radiculopathy, lumbosacral region: Secondary | ICD-10-CM | POA: Insufficient documentation

## 2019-11-13 DIAGNOSIS — Z5181 Encounter for therapeutic drug level monitoring: Secondary | ICD-10-CM | POA: Diagnosis not present

## 2019-11-13 DIAGNOSIS — M62838 Other muscle spasm: Secondary | ICD-10-CM

## 2019-11-13 DIAGNOSIS — M5416 Radiculopathy, lumbar region: Secondary | ICD-10-CM

## 2019-11-13 DIAGNOSIS — M961 Postlaminectomy syndrome, not elsewhere classified: Secondary | ICD-10-CM

## 2019-11-13 MED ORDER — MORPHINE SULFATE ER 15 MG PO TBCR: 15.0000 mg | EXTENDED_RELEASE_TABLET | Freq: Three times a day (TID) | ORAL | 0 refills | Status: DC

## 2019-11-13 MED ORDER — MORPHINE SULFATE 15 MG PO TABS: 15.0000 mg | ORAL_TABLET | Freq: Two times a day (BID) | ORAL | 0 refills | Status: DC | PRN

## 2019-11-13 NOTE — Telephone Encounter (Signed)
Patient calls nurse line with questions regarding lasix. Patient was seen in the clinic last week by Dr. Susa Simmonds and was told to hold off on the lasix until after chest X-ray and echocardiogram. Patient has completed both and wants to know when she should start taking lasix for LLE swelling.   To PCP  Please advise  Talbot Grumbling, RN

## 2019-11-13 NOTE — Progress Notes (Signed)
Subjective:    Patient ID: [name] [name], female    DOB: August 02, 1955, 64 y.o.   MRN: [id number]  HPI: [name] [name] is a 64 y.o. female who returns for follow up appointment for chronic pain and medication refill. She states her pain is located in her lower back mainly right side radiating into her right buttock and right lower extremity. She rates her pain 10. Her current exercise regime is walking and performing stretching exercises.  [name] Morphine equivalent is 75.00 MME.    Last Oral Swab was Performed on 09/11/2019, it was consistent.   Pain Inventory Average Pain 10 Pain Right Now 10 My pain is constant, sharp, burning, stabbing, tingling and aching  In the last 24 hours, has pain interfered with the following? General activity 10 Relation with others 10 Enjoyment of life 10 What TIME of day is your pain at its worst? all Sleep (in general) Poor  Pain is worse with: sitting, inactivity and standing Pain improves with: rest, heat/ice and medication Relief from Meds: 1  Mobility use a cane ability to climb steps?  no do you drive?  yes  Function disabled: date disabled 10/2007 I need assistance with the following:  bathing, meal prep, household duties and shopping  Neuro/Psych bladder control problems trouble walking spasms  Prior Studies Any changes since last visit?  yes x-rays  Physicians involved in your care Primary care .   Family History  Problem Relation Age of Onset  . Heart disease Mother 24       CHF; CAD  . Hypertension Mother   . Heart failure Mother   . Diabetes Sister   . Lupus Sister   . Kidney failure Sister   . Hypertension Brother   . Hyperlipidemia Sister   . Hypertension Sister   . Colon cancer Neg Hx   . Rectal cancer Neg Hx    Social History   Socioeconomic History  . Marital status: Widowed    Spouse name: Not on file  . Number of children: 3  . Years of education: Not on file  . Highest education level:  Not on file  Occupational History  . 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 Smoking status: Former Smoker    Packs/day: 0.50    Years: 20.00    Pack years: 10.00    Types: Cigarettes    Quit date: 07/21/2008    Years since quitting: 11.3  . Smokeless tobacco: Never Used  Substance and Sexual Activity  . Alcohol use: No  . Drug use: No  . Sexual activity: Not on file  Other Topics Concern  . Not on file  Social History Narrative   Marital status: widowed since 2002; not dating which is bad in 2018      Children: 3 children (45, 13, 45); 13 grandchildren; 1 gg      Employment: disability since 2009; retail      Lives: alone in [place] in apartment      Tobacco: quit in 2011      Alcohol: none      Drugs: none      Exercise: minimal in 2018      ADLs: independent with ADLs; drives      Seatbelt: 100%; no texting.   Social Determinants of Health   Financial Resource Strain:   . Difficulty of Paying Living Expenses:   Food Insecurity:   . Worried About [employer] of  Food in the Last Year:   . [place] in the Last Year:   Transportation Needs:   . Film/video editor (Medical):   Marland Kitchen Lack of Transportation (Non-Medical):   Physical Activity:   . Days of Exercise per Week:   . Minutes of Exercise per Session:   Stress:   . Feeling of Stress :   Social Connections:   . Frequency of Communication with Friends and Family:   . Frequency of Social Gatherings with Friends and Family:   . Attends Religious Services:   . Active Member of Clubs or Organizations:   . Attends Archivist Meetings:   Marland Kitchen Marital Status:    Past Surgical History:  Procedure Laterality Date  . ABDOMINAL HYSTERECTOMY  1982   DUB; ovaries intact  . bladder surg  1992   bladder retraction  . BREAST BIOPSY    . BREAST REDUCTION SURGERY  1984  . CARDIAC CATHETERIZATION  1990's   [name]  . COLON SURGERY  05/11/11   [facility] of adhesions  Crohn's  . MASS EXCISION Left 01/03/2018   Procedure: EXCISION LEFT UPPER BACK LIPOMA ERAS PATH;  Surgeon: Erroll Luna, MD;  Location: [place];  Service: General;  Laterality: Left;  . REDUCTION MAMMAPLASTY    . SPINE SURGERY     5 total (1 upper, 4 lumbar)   Past Medical History:  Diagnosis Date  . Anemia   . Blood transfusion without reported diagnosis   . Crohn's disease ([place])   . Disorder of sacroiliac joint   . Hyperlipidemia   . Hypertension   . Ischemic colitis ([place])   . Lumbago   . Lumbar post-laminectomy syndrome   . Lumbosacral neuritis   . Lumbosacral radiculitis   . Sciatica    BP 137/84   Pulse (!) 58   Temp 98.7 F (37.1 C)   Ht 5\' 4"  (1.626 m)   Wt 217 lb 6.4 oz (98.6 kg)   SpO2 96%   BMI 37.32 kg/m   Opioid Risk Score:   Fall Risk Score:  `1  Depression screen PHQ 2/9  Depression screen [facility] 2/9 11/07/2019 11/01/2019 10/14/2019 04/18/2019 04/15/2019 09/27/2018 08/24/2018  Decreased Interest 3 3 0 0 0 0 0  Down, Depressed, Hopeless 1 1 0 0 0 0 0  PHQ - 2 Score 4 4 0 0 0 0 0  Altered sleeping 3 3 - - - - 0  Tired, decreased energy 3 3 - - - - 0  Change in appetite 3 3 - - - - 0  Feeling bad or failure about yourself  2 1 - - - - 0  Trouble concentrating 0 0 - - - - 0  Moving slowly or fidgety/restless 0 0 - - - - 0  Suicidal thoughts 0 - - - - - 0  PHQ-9 Score 15 14 - - - - 0  Difficult doing work/chores Extremely dIfficult Extremely dIfficult - - - - Not difficult at all  Some recent data might be hidden   Review of Systems  Constitutional: Positive for appetite change.  Gastrointestinal: Positive for nausea.  Musculoskeletal: Positive for gait problem.  Neurological:       Spasms   All other systems reviewed and are negative.      Objective:   Physical Exam Vitals and nursing note reviewed.  Constitutional:      Appearance: Normal appearance.  Cardiovascular:     Rate and Rhythm: Normal rate  and regular rhythm.      Pulses: Normal pulses.     Heart sounds: Normal heart sounds.  Pulmonary:     Effort: Pulmonary effort is normal.     Breath sounds: Normal breath sounds.  Musculoskeletal:     Cervical back: Normal range of motion and neck supple.     Comments: Normal Muscle Bulk and Muscle Testing Reveals:  Upper Extremities: Full ROM and Muscle Strength 5/5  Lumbar Hypersensitivity Mainly Right Side  Lower Extremities: Right: Decreased ROM and muscle Strength 5/5 Right Lower Extremity Flexion Produces Pain into her Lumbar, Right Buttock and Right Lower Extremity Left Lower Extremity: Full ROM and Muscle Strength 5/5 Arises from Table slowly using cane for support Antalgic  Gait   Skin:    General: Skin is warm and dry.  Neurological:     Mental Status: She is alert and oriented to person, place, and time.  Psychiatric:        Mood and Affect: Mood normal.        Behavior: Behavior normal.           Assessment & Plan:  1.Lumbar Postlaminectomy/ Spinal Stenosis Lumbar Region/ Post-Op Pain/Low back pain with radiating symptoms in L3-4 distribution: continues to be limited by pain. Refilled:MSIR 15 mg one tablet twice a day as needed for moderate to sever pain#60andMorphine Sulfate ER15 mg Q 8 hours.11/13/2019. We will continue the opioid monitoring program, this consists of regular clinic visits, examinations, urine drug screen, pill counts as well as use of [place] Controlled Substance Reporting System. Encouraged to Continue exercise regime.  2. Lumbar Radiculopathy:ContinueTopamax:.Continue to Monitor05/26/2021 3.ChronicRight Knee Pain:No complaints today.Continue with Ice TherapyandVoltaren Gel.11/13/2019 4. Muscle Spasm: Continuecurrent treatment: Tizanidine.11/13/2019 5. Lipoma: S/P Left Lipoma Excision of upper back By Dr. Brantley Stage on 01/03/2018. Surgery Following. Continue to Monitor.11/13/2019. 6. Paresthesia of Skin: ContinueTopamax.11/13/2019 7.  Adhesive Capsulitis of Right Shoulder: S/PU/S Cortisoneinjectionon 01/13/2020with some relief noted.Continue to monitor. Continue HEP as Tolerated.11/13/2019   8minutes of face to face patient care time was spent during this visit. All questions were encouraged and answered.  F/U in 1 month

## 2019-11-19 NOTE — Telephone Encounter (Cosign Needed)
Late entry   Called patient and discussed ECHO, CXR and recent lab workup for lower extremity edema.  ECHO significant for Grade 1 diastolic dysfunction.

## 2019-11-19 NOTE — Assessment & Plan Note (Addendum)
Following the [employer] of family medicine journal work-up: There is a high suspicion for underlying cardiac disease.  Obtain CBC, BMP, BNP, TSH, obtain echo and chest x-ray.  Deferring EKG and UA at this time.  Obtain these at next visit.  Differential diagnoses includes weight gain, insufficiency, primary versus secondary lymphedema, congestive heart failure, idiopathic edema, pulmonary hypertension, drug-induced, obesity, renal disease, liver disease -EKG at follow-up if patient able to tolerate lying flat -Follow-up when all test results have resulted.

## 2019-11-19 NOTE — Progress Notes (Signed)
    SUBJECTIVE:   CHIEF COMPLAINT / HPI:  CC: Lower extremity edema  Lower extremity edema Patient reports worsening bilateral lower extremity edema in the past couple months.  Says she has had lower extremity edema for years now.  Denies shortness of breath and chest pain.  Reports chronic back pain.  Denies recent weight gain.  Has taken hypertensive medications without relief.  Reports she uses very little salt due to her hypertension.  Would like to increase her fluid medication to help alleviate symptoms.    PERTINENT  PMH / PSH: Hypertension, chronic pain, hyperlipidemia  OBJECTIVE:   BP 124/76 (BP Location: Right Arm, Patient Position: Sitting, Cuff Size: Normal)   Pulse 73   Ht 5\' 4"  (1.626 m)   Wt 217 lb 9.6 oz (98.7 kg)   SpO2 97%   BMI 37.35 kg/m    GEN: pleasant older female, in no acute distress  CV: regular rate and rhythm, no murmurs appreciated  RESP: no increased work of breathing, clear to ascultation bilaterally with no crackles, wheezes, or rhonchi  MSK: Decreased LE range of motion due to pain, woody lower extremity edema nonpitting  SKIN: warm, dry    ASSESSMENT/PLAN:   Lower extremity edema Following the [employer] Board of family medicine journal work-up: There is a high suspicion for underlying cardiac disease.  Obtain CBC, BMP, BNP, TSH, obtain echo and chest x-ray.  Deferring EKG and UA at this time.  Obtain these at next visit.  Differential diagnoses includes weight gain, insufficiency, primary versus secondary lymphedema, congestive heart failure, idiopathic edema, pulmonary hypertension, drug-induced, obesity, renal disease, liver disease -EKG at follow-up if patient able to tolerate lying flat -Follow-up when all test results have resulted.     Lyndee Hensen, [place]

## 2019-11-27 DIAGNOSIS — I10 Essential (primary) hypertension: Secondary | ICD-10-CM | POA: Diagnosis not present

## 2019-11-27 DIAGNOSIS — M961 Postlaminectomy syndrome, not elsewhere classified: Secondary | ICD-10-CM | POA: Diagnosis not present

## 2019-11-27 DIAGNOSIS — G5701 Lesion of sciatic nerve, right lower limb: Secondary | ICD-10-CM | POA: Diagnosis not present

## 2019-11-28 DIAGNOSIS — M545 Low back pain: Secondary | ICD-10-CM | POA: Diagnosis not present

## 2019-12-04 ENCOUNTER — Other Ambulatory Visit: Payer: Self-pay | Admitting: *Deleted

## 2019-12-04 DIAGNOSIS — E876 Hypokalemia: Secondary | ICD-10-CM

## 2019-12-04 MED ORDER — POTASSIUM CHLORIDE 20 MEQ PO PACK: PACK | ORAL | 1 refills | Status: DC

## 2019-12-11 ENCOUNTER — Ambulatory Visit
Admission: RE | Admit: 2019-12-11 | Discharge: 2019-12-11 | Disposition: A | Discharge status: Home / Self Care | Payer: Medicare Other | Source: Ambulatory Visit | Attending: Family Medicine | Admitting: Family Medicine

## 2019-12-11 ENCOUNTER — Encounter: Payer: Medicare Other | Attending: Physical Medicine & Rehabilitation | Admitting: Registered Nurse

## 2019-12-11 ENCOUNTER — Encounter: Payer: Self-pay | Admitting: Registered Nurse

## 2019-12-11 ENCOUNTER — Other Ambulatory Visit: Payer: Self-pay

## 2019-12-11 VITALS — BP 126/76 | HR 60 | Temp 97.7°F | Ht 64.0 in | Wt 216.8 lb

## 2019-12-11 DIAGNOSIS — M62838 Other muscle spasm: Secondary | ICD-10-CM

## 2019-12-11 DIAGNOSIS — Z79891 Long term (current) use of opiate analgesic: Secondary | ICD-10-CM | POA: Diagnosis not present

## 2019-12-11 DIAGNOSIS — M25551 Pain in right hip: Secondary | ICD-10-CM | POA: Diagnosis not present

## 2019-12-11 DIAGNOSIS — M5416 Radiculopathy, lumbar region: Secondary | ICD-10-CM | POA: Diagnosis not present

## 2019-12-11 DIAGNOSIS — G894 Chronic pain syndrome: Secondary | ICD-10-CM | POA: Diagnosis not present

## 2019-12-11 DIAGNOSIS — M961 Postlaminectomy syndrome, not elsewhere classified: Secondary | ICD-10-CM | POA: Insufficient documentation

## 2019-12-11 DIAGNOSIS — M5417 Radiculopathy, lumbosacral region: Secondary | ICD-10-CM | POA: Insufficient documentation

## 2019-12-11 DIAGNOSIS — Z1231 Encounter for screening mammogram for malignant neoplasm of breast: Secondary | ICD-10-CM | POA: Diagnosis not present

## 2019-12-11 DIAGNOSIS — Z5181 Encounter for therapeutic drug level monitoring: Secondary | ICD-10-CM

## 2019-12-11 DIAGNOSIS — M7071 Other bursitis of hip, right hip: Secondary | ICD-10-CM | POA: Insufficient documentation

## 2019-12-11 DIAGNOSIS — G8929 Other chronic pain: Secondary | ICD-10-CM | POA: Insufficient documentation

## 2019-12-11 DIAGNOSIS — M5414 Radiculopathy, thoracic region: Secondary | ICD-10-CM | POA: Insufficient documentation

## 2019-12-11 DIAGNOSIS — M4807 Spinal stenosis, lumbosacral region: Secondary | ICD-10-CM

## 2019-12-11 MED ORDER — MORPHINE SULFATE ER 15 MG PO TBCR: 15.0000 mg | EXTENDED_RELEASE_TABLET | Freq: Three times a day (TID) | ORAL | 0 refills | Status: DC

## 2019-12-11 MED ORDER — MORPHINE SULFATE 15 MG PO TABS: 15.0000 mg | ORAL_TABLET | Freq: Two times a day (BID) | ORAL | 0 refills | Status: DC | PRN

## 2019-12-11 NOTE — Progress Notes (Signed)
Subjective:    Patient ID: [name] [name], female    DOB: [birth year], 64 y.o.   MRN: [id number]  HPI: [name] [name] is a 64 y.o. female who returns for follow up appointment for chronic pain and medication refill. She states her pain is located in her lower back pain radiating into her right buttock, right hip and right lower extremity. She rates her pain 10. Her current exercise regime is walking and performing stretching exercises.  [name] [name] Morphine equivalent is 75.00  MME.    Last Oral Swab was Performed on 09/11/2019, it was consistent.    Pain Inventory Average Pain 10 Pain Right Now 10 My pain is constant, sharp, burning, stabbing, tingling and aching  In the last 24 hours, has pain interfered with the following? General activity 10 Relation with others 10 Enjoyment of life 10 What TIME of day is your pain at its worst? Pain morning, daytime, evening & night. Sleep (in general) Poor  Pain is worse with: walking, bending, sitting, inactivity and standing Pain improves with: rest, heat/ice and medication Relief from Meds: 1  Mobility walk with assistance use a cane how many minutes can you walk? 5 mins ability to climb steps?  yes do you drive?  yes use a wheelchair Do you have any goals in this area?  yes  Function disabled: date disabled 10/2007 I need assistance with the following:  bathing, meal prep, household duties and shopping Do you have any goals in this area?  yes  Neuro/Psych weakness numbness tingling trouble walking spasms  Prior Studies Any changes since last visit?  no  Physicians involved in your care Any changes since last visit?  no   Family History  Problem Relation Age of Onset  . Heart disease Mother 71       CHF; CAD  . Hypertension Mother   . Heart failure Mother   . Diabetes Sister   . Lupus Sister   . Kidney failure Sister   . Hypertension Brother   . Hyperlipidemia Sister   . Hypertension Sister   . Colon  cancer Neg Hx   . Rectal cancer Neg Hx    Social History   Socioeconomic History  . Marital status: Widowed    Spouse name: Not on file  . Number of children: 3  . Years of education: Not on file  . Highest education level: Not on file  Occupational History  . 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 Smoking status: Former Smoker    Packs/day: 0.50    Years: 20.00    Pack years: 10.00    Types: Cigarettes    Quit date: 07/21/2008    Years since quitting: 11.3  . Smokeless tobacco: Never Used  Vaping Use  . Vaping Use: Never used  Substance and Sexual Activity  . Alcohol use: No  . Drug use: No  . Sexual activity: Not on file  Other Topics Concern  . Not on file  Social History Narrative   Marital status: widowed since 2002; not dating which is bad in 2018      Children: 3 children (45, 9, 67); 13 grandchildren; 1 gg      Employment: disability since 2009; retail      Lives: alone in [place] in apartment      Tobacco: quit in 2011      Alcohol: none      Drugs: none  Exercise: minimal in 2018      ADLs: independent with ADLs; drives      Seatbelt: 100%; no texting.   Social Determinants of Health   Financial Resource Strain:   . Difficulty of Paying Living Expenses:   Food Insecurity:   . Worried About [employer] fundraiser in the Last Year:   . Arboriculturist in the Last Year:   Transportation Needs:   . Film/video editor (Medical):   [name] [name] of Transportation (Non-Medical):   Physical Activity:   . Days of Exercise per Week:   . Minutes of Exercise per Session:   Stress:   . Feeling of Stress :   Social Connections:   . Frequency of Communication with Friends and Family:   . Frequency of Social Gatherings with Friends and Family:   . Attends Religious Services:   . Active Member of Clubs or Organizations:   . Attends Archivist Meetings:   [name] [name] Marital Status:    Past Surgical History:  Procedure  Laterality Date  . ABDOMINAL HYSTERECTOMY  1982   DUB; ovaries intact  . bladder surg  1992   bladder retraction  . BREAST BIOPSY    . BREAST REDUCTION SURGERY  1984  . CARDIAC CATHETERIZATION  1990's   [name] [name]  . COLON SURGERY  05/11/11   [facility] of adhesions Crohn's  . MASS EXCISION Left 01/03/2018   Procedure: EXCISION LEFT UPPER BACK LIPOMA ERAS PATH;  Surgeon: [name] Luna, MD;  Location: [place];  Service: General;  Laterality: Left;  . REDUCTION MAMMAPLASTY    . SPINE SURGERY     5 total (1 upper, 4 lumbar)   Past Medical History:  Diagnosis Date  . Anemia   . Blood transfusion without reported diagnosis   . Crohn's disease ([place])   . Disorder of sacroiliac joint   . Hyperlipidemia   . Hypertension   . Ischemic colitis ([place])   . Lumbago   . Lumbar post-laminectomy syndrome   . Lumbosacral neuritis   . Lumbosacral radiculitis   . Sciatica    BP 126/76   Pulse 60   Temp 97.7 F (36.5 C)   Ht 5\' 4"  (1.626 m)   Wt 216 lb 12.8 oz (98.3 kg)   SpO2 96%   BMI 37.21 kg/m   Opioid Risk Score:   Fall Risk Score:  `1  Depression screen PHQ 2/9  Depression screen [facility] 2/9 11/07/2019 11/01/2019 10/14/2019 04/18/2019 04/15/2019 09/27/2018 08/24/2018  Decreased Interest 3 3 0 0 0 0 0  Down, Depressed, Hopeless 1 1 0 0 0 0 0  PHQ - 2 Score 4 4 0 0 0 0 0  Altered sleeping 3 3 - - - - 0  Tired, decreased energy 3 3 - - - - 0  Change in appetite 3 3 - - - - 0  Feeling bad or failure about yourself  2 1 - - - - 0  Trouble concentrating 0 0 - - - - 0  Moving slowly or fidgety/restless 0 0 - - - - 0  Suicidal thoughts 0 - - - - - 0  PHQ-9 Score 15 14 - - - - 0  Difficult doing work/chores Extremely dIfficult Extremely dIfficult - - - - Not difficult at all  Some recent data might be hidden   Review of Systems  Constitutional: Positive for appetite change.       Poor appetite  HENT: Negative.  Eyes: Negative.   Respiratory: Negative.     Cardiovascular: Negative.   Gastrointestinal: Positive for nausea.  Endocrine: Negative.   Genitourinary: Positive for urgency.       Urine leakage  Musculoskeletal: Positive for back pain and gait problem.       Spasms  Skin: Negative.   Allergic/Immunologic: Negative.   Neurological: Positive for weakness and numbness. Negative for dizziness.       Tingling  Hematological: Negative.        Objective:   Physical Exam Vitals and nursing note reviewed.  Constitutional:      Appearance: Normal appearance.  Cardiovascular:     Rate and Rhythm: Normal rate and regular rhythm.     Pulses: Normal pulses.     Heart sounds: Normal heart sounds.  Pulmonary:     Effort: Pulmonary effort is normal.     Breath sounds: Normal breath sounds.  Musculoskeletal:     Cervical back: Normal range of motion and neck supple.     Comments: Normal Muscle Bulk and Muscle Testing Reveals:  Upper Extremities: Full ROM and Muscle Strength 5/5  Lumbar Hypersensitivity Right Greater Trochanteric Tenderness  Lower Extremities: : Right: Decreased ROM and Muscle Strength 5/5 Right Lower Extremity Flexion Produces Pain into her Lumbar, Right  Hip and Right Lower Extremity Left Lower Extremity: Full ROM and Muscle Strength 5/5 Arises from Table Slowly using cane for support Antalgic Gait   Skin:    General: Skin is warm and dry.  Neurological:     Mental Status: She is alert and oriented to person, place, and time.  Psychiatric:        Mood and Affect: Mood normal.        Behavior: Behavior normal.           Assessment & Plan:  1.Lumbar Postlaminectomy/ Spinal Stenosis Lumbar Region/ Post-Op Pain/Low back pain with radiating symptoms in L3-4 distribution: continues to be limited by pain. Refilled:MSIR 15 mg one tablet twice a day as needed for moderate to sever pain#60andMorphine Sulfate ER15 mg Q 8 hours.12/11/2019. We will continue the opioid monitoring program, this consists of regular  clinic visits, examinations, urine drug screen, pill counts as well as use of [place] Controlled Substance Reporting System. Encouraged to Continue exercise regime.  2. Lumbar Radiculopathy:ContinueTopamax:.Continue to Monitor06/23/2021 3.ChronicRight Knee Pain:No complaints today.Continue with Ice TherapyandVoltaren Gel.12/11/2019 4. Muscle Spasm: Continuecurrent treatment: Tizanidine.12/11/2019 5. Lipoma: S/P Left Lipoma Excision of upper back By Dr. Brantley Stage on 01/03/2018. Surgery Following. Continue to Monitor.12/11/2019. 6. Paresthesia of Skin: ContinueTopamax.12/11/2019 7. Adhesive Capsulitis of Right Shoulder: S/PU/S Cortisoneinjectionon 01/13/2020with some relief noted.Continue to monitor. Continue HEP as Tolerated.12/11/2019   35minutes of face to face patient care time was spent during this visit. All questions were encouraged and answered.  F/U in 1 month

## 2019-12-26 DIAGNOSIS — I10 Essential (primary) hypertension: Secondary | ICD-10-CM | POA: Diagnosis not present

## 2019-12-26 DIAGNOSIS — M961 Postlaminectomy syndrome, not elsewhere classified: Secondary | ICD-10-CM | POA: Diagnosis not present

## 2019-12-26 DIAGNOSIS — G5701 Lesion of sciatic nerve, right lower limb: Secondary | ICD-10-CM | POA: Diagnosis not present

## 2019-12-30 DIAGNOSIS — M961 Postlaminectomy syndrome, not elsewhere classified: Secondary | ICD-10-CM | POA: Diagnosis not present

## 2020-01-03 DIAGNOSIS — M5416 Radiculopathy, lumbar region: Secondary | ICD-10-CM | POA: Diagnosis not present

## 2020-01-03 DIAGNOSIS — M961 Postlaminectomy syndrome, not elsewhere classified: Secondary | ICD-10-CM | POA: Diagnosis not present

## 2020-01-09 ENCOUNTER — Other Ambulatory Visit: Payer: Self-pay

## 2020-01-09 ENCOUNTER — Encounter: Payer: Medicare Other | Attending: Physical Medicine & Rehabilitation | Admitting: Registered Nurse

## 2020-01-09 ENCOUNTER — Encounter: Payer: Self-pay | Admitting: Registered Nurse

## 2020-01-09 VITALS — BP 148/78 | HR 63 | Temp 98.8°F | Ht 64.0 in | Wt 218.6 lb

## 2020-01-09 DIAGNOSIS — M4807 Spinal stenosis, lumbosacral region: Secondary | ICD-10-CM | POA: Diagnosis not present

## 2020-01-09 DIAGNOSIS — M5416 Radiculopathy, lumbar region: Secondary | ICD-10-CM | POA: Diagnosis not present

## 2020-01-09 DIAGNOSIS — M25551 Pain in right hip: Secondary | ICD-10-CM | POA: Insufficient documentation

## 2020-01-09 DIAGNOSIS — M5414 Radiculopathy, thoracic region: Secondary | ICD-10-CM | POA: Diagnosis not present

## 2020-01-09 DIAGNOSIS — Z79891 Long term (current) use of opiate analgesic: Secondary | ICD-10-CM | POA: Diagnosis not present

## 2020-01-09 DIAGNOSIS — Z5181 Encounter for therapeutic drug level monitoring: Secondary | ICD-10-CM | POA: Diagnosis not present

## 2020-01-09 DIAGNOSIS — M5417 Radiculopathy, lumbosacral region: Secondary | ICD-10-CM | POA: Diagnosis not present

## 2020-01-09 DIAGNOSIS — G894 Chronic pain syndrome: Secondary | ICD-10-CM | POA: Insufficient documentation

## 2020-01-09 DIAGNOSIS — M62838 Other muscle spasm: Secondary | ICD-10-CM

## 2020-01-09 DIAGNOSIS — M961 Postlaminectomy syndrome, not elsewhere classified: Secondary | ICD-10-CM | POA: Diagnosis not present

## 2020-01-09 DIAGNOSIS — M7071 Other bursitis of hip, right hip: Secondary | ICD-10-CM | POA: Diagnosis not present

## 2020-01-09 DIAGNOSIS — G8929 Other chronic pain: Secondary | ICD-10-CM | POA: Diagnosis not present

## 2020-01-09 MED ORDER — TOPIRAMATE 50 MG PO TABS: 50.0000 mg | ORAL_TABLET | Freq: Every day | ORAL | 2 refills | Status: DC

## 2020-01-09 MED ORDER — MORPHINE SULFATE 15 MG PO TABS: 15.0000 mg | ORAL_TABLET | Freq: Two times a day (BID) | ORAL | 0 refills | Status: DC | PRN

## 2020-01-09 MED ORDER — TIZANIDINE HCL 2 MG PO TABS: 2.0000 mg | ORAL_TABLET | Freq: Two times a day (BID) | ORAL | 2 refills | Status: DC | PRN

## 2020-01-09 MED ORDER — MORPHINE SULFATE ER 15 MG PO TBCR: 15.0000 mg | EXTENDED_RELEASE_TABLET | Freq: Three times a day (TID) | ORAL | 0 refills | Status: DC

## 2020-01-09 NOTE — Progress Notes (Signed)
Subjective:    Patient ID: [name] [name], female    DOB: 1956/01/18, 64 y.o.   MRN: [id number]  HPI: [name] [name] is a 64 y.o. female who returns for follow up appointment for chronic pain and medication refill. She states her pain is located in her lower back radiating into her right hip and right lower extremity. She rates her pain 6. Her current exercise regime is walking and performing stretching exercises.  [name] [name] reports she had Trial Stimulator placed with good relief noted, also states she developed skin irritation and blisters from tape, she followed up with Dr Maryjean Ka office. On her abdomen their are noted scabbed areas noted, she was instructed to continue to monitor and follow up with Dr Maryjean Ka and PCP, she verbalizes understanding. No Drainage noted.   [name] [name] Morphine equivalent is 75.00  MME.    Last Oral Swab was Performed on 09/11/2019, it was consistent.   Pain Inventory Average Pain 6 Pain Right Now 6 My pain is constant, burning, dull, stabbing and aching  In the last 24 hours, has pain interfered with the following? General activity 5 Relation with others 5 Enjoyment of life 5 What TIME of day is your pain at its worst? All the time. Sleep (in general) Fair  Pain is worse with: walking, bending, sitting, inactivity and standing Pain improves with: rest and medication Relief from Meds: 2  Mobility use a cane how many minutes can you walk? 5 mins ability to climb steps?  yes do you drive?  yes Do you have any goals in this area?  yes  Function disabled: date disabled 2009 I need assistance with the following:  meal prep, household duties and shopping Do you have any goals in this area?  yes  Neuro/Psych bladder control problems weakness numbness tingling trouble walking spasms  Prior Studies Any changes since last visit?  no  Physicians involved in your care Any changes since last visit?  no   Family History  Problem  Relation Age of Onset  . Heart disease Mother 30       CHF; CAD  . Hypertension Mother   . Heart failure Mother   . Diabetes Sister   . Lupus Sister   . Kidney failure Sister   . Hypertension Brother   . Hyperlipidemia Sister   . Hypertension Sister   . Colon cancer Neg Hx   . Rectal cancer Neg Hx    Social History   Socioeconomic History  . Marital status: Widowed    Spouse name: Not on file  . Number of children: 3  . Years of education: Not on file  . Highest education level: Not on file  Occupational History  . 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 Smoking status: Former Smoker    Packs/day: 0.50    Years: 20.00    Pack years: 10.00    Types: Cigarettes    Quit date: 07/21/2008    Years since quitting: 11.4  . Smokeless tobacco: Never Used  Vaping Use  . Vaping Use: Never used  Substance and Sexual Activity  . Alcohol use: No  . Drug use: No  . Sexual activity: Not on file  Other Topics Concern  . Not on file  Social History Narrative   Marital status: widowed since 2002; not dating which is bad in 2018      Children: 3 children (45, 93, 40); 13 grandchildren; 1 gg  Employment: disability since 2009; retail      Lives: alone in [place] in apartment      Tobacco: quit in 2011      Alcohol: none      Drugs: none      Exercise: minimal in 2018      ADLs: independent with ADLs; drives      Seatbelt: 100%; no texting.   Social Determinants of Health   Financial Resource Strain:   . Difficulty of Paying Living Expenses:   Food Insecurity:   . Worried About Charity fundraiser in the Last Year:   . Arboriculturist in the Last Year:   Transportation Needs:   . Film/video editor (Medical):   Marland Kitchen Lack of Transportation (Non-Medical):   Physical Activity:   . Days of Exercise per Week:   . Minutes of Exercise per Session:   Stress:   . Feeling of Stress :   Social Connections:   . Frequency of Communication with  Friends and Family:   . Frequency of Social Gatherings with Friends and Family:   . Attends Religious Services:   . Active Member of Clubs or Organizations:   . Attends Archivist Meetings:   Marland Kitchen Marital Status:    Past Surgical History:  Procedure Laterality Date  . ABDOMINAL HYSTERECTOMY  1982   DUB; ovaries intact  . bladder surg  1992   bladder retraction  . BREAST BIOPSY    . BREAST REDUCTION SURGERY  1984  . CARDIAC CATHETERIZATION  1990's   [name]  . COLON SURGERY  05/11/11   [facility] of adhesions Crohn's  . MASS EXCISION Left 01/03/2018   Procedure: EXCISION LEFT UPPER BACK LIPOMA ERAS PATH;  Surgeon: Erroll Luna, MD;  Location: [facility];  Service: General;  Laterality: Left;  . REDUCTION MAMMAPLASTY    . SPINE SURGERY     5 total (1 upper, 4 lumbar)   Past Medical History:  Diagnosis Date  . Anemia   . Blood transfusion without reported diagnosis   . Crohn's disease ([place])   . Disorder of sacroiliac joint   . Hyperlipidemia   . Hypertension   . Ischemic colitis ([place])   . Lumbago   . Lumbar post-laminectomy syndrome   . Lumbosacral neuritis   . Lumbosacral radiculitis   . Sciatica    BP (!) 148/78   Pulse 63   Temp 98.8 F (37.1 C)   Ht 5\' 4"  (1.626 m)   Wt 218 lb 9.6 oz (99.2 kg)   SpO2 98%   BMI 37.52 kg/m   Opioid Risk Score:   Fall Risk Score:  `1  Depression screen PHQ 2/9  Depression screen [facility] 2/9 12/11/2019 11/07/2019 11/01/2019 10/14/2019 04/18/2019 04/15/2019 09/27/2018  Decreased Interest 0 3 3 0 0 0 0  Down, Depressed, Hopeless 0 1 1 0 0 0 0  PHQ - 2 Score 0 4 4 0 0 0 0  Altered sleeping 0 3 3 - - - -  Tired, decreased energy 0 3 3 - - - -  Change in appetite 0 3 3 - - - -  Feeling bad or failure about yourself  0 2 1 - - - -  Trouble concentrating 0 0 0 - - - -  Moving slowly or fidgety/restless 0 0 0 - - - -  Suicidal thoughts 0 0 - - - - -  PHQ-9 Score 0 15 14 - - - -  Difficult doing work/chores -  Extremely dIfficult Extremely dIfficult - - - -  Some recent data might be hidden   Review of Systems  HENT: Negative.   Eyes: Negative.   Respiratory: Negative.   Cardiovascular: Negative.   Gastrointestinal: Negative.   Endocrine: Negative.   Genitourinary: Positive for urgency.  Musculoskeletal: Positive for back pain and gait problem.  Skin: Negative.   Allergic/Immunologic: Negative.   Neurological: Positive for weakness and numbness.  Psychiatric/Behavioral: Negative.        Objective:   Physical Exam Vitals and nursing note reviewed.  Constitutional:      Appearance: Normal appearance.  Cardiovascular:     Rate and Rhythm: Normal rate and regular rhythm.     Pulses: Normal pulses.     Heart sounds: Normal heart sounds.  Pulmonary:     Effort: Pulmonary effort is normal.     Breath sounds: Normal breath sounds.  Musculoskeletal:     Cervical back: Normal range of motion and neck supple.     Comments: Normal Muscle Bulk and Muscle Testing Reveals:  Upper Extremities: Full ROM and Muscle Strength 5/5 Lumbar Hypersensitivity Right Greater Trochanter Tenderness Lower Extremities: Right Lower Extremity with Decreased ROM and Muscle Strength 5/5 Right Lower Extremity Flexion Produces Pain into her Lumbar Left Lower Extremity: Full ROM and Muscle Strength 5/5 Arises from Table Slowly using cane for support Antalgic  Gait   Skin:    General: Skin is warm and dry.  Neurological:     Mental Status: She is alert and oriented to person, place, and time.  Psychiatric:        Mood and Affect: Mood normal.        Behavior: Behavior normal.           Assessment & Plan:  1.Lumbar Postlaminectomy/ Spinal Stenosis Lumbar Region/ Post-Op Pain/Low back pain with radiating symptoms in L3-4 distribution: continues to be limited by pain. Refilled:MSIR 15 mg one tablet twice a day as needed for moderate to sever pain#60andMorphine Sulfate ER15 mg Q 8 hours.Second script  e-scribed to accommodate scheduled appointment. 01/09/2020. We will continue the opioid monitoring program, this consists of regular clinic visits, examinations, urine drug screen, pill counts as well as use of [place] Controlled Substance Reporting System. Encouraged to Continue exercise regime.  2. Lumbar Radiculopathy:ContinueTopamax:.Continue to Monitor07/22/2021 3.ChronicRight Knee Pain:No complaints today.Continue with Ice TherapyandVoltaren Gel.01/09/2020 4. Muscle Spasm: Continuecurrent treatment: Tizanidine.01/09/2020 5. Lipoma: S/P Left Lipoma Excision of upper back By Dr. Brantley Stage on 01/03/2018. Surgery Following. Continue to Monitor.01/09/2020. 6. Paresthesia of Skin: ContinueTopamax.01/09/2020 7. Adhesive Capsulitis of Right Shoulder: S/PU/S Cortisoneinjectionon 01/13/2020with some relief noted.Continue to monitor. Continue HEP as Tolerated.01/09/2020   30minutes of face to face patient care time was spent during this visit. All questions were encouraged and answered.  F/U in 1 month

## 2020-01-10 ENCOUNTER — Other Ambulatory Visit: Payer: Self-pay | Admitting: Family Medicine

## 2020-01-10 DIAGNOSIS — I10 Essential (primary) hypertension: Secondary | ICD-10-CM

## 2020-02-03 DIAGNOSIS — Z1152 Encounter for screening for COVID-19: Secondary | ICD-10-CM | POA: Diagnosis not present

## 2020-02-18 ENCOUNTER — Encounter: Payer: Self-pay | Admitting: Registered Nurse

## 2020-02-18 ENCOUNTER — Encounter: Payer: Medicare Other | Attending: Physical Medicine & Rehabilitation | Admitting: Registered Nurse

## 2020-02-18 ENCOUNTER — Other Ambulatory Visit: Payer: Self-pay

## 2020-02-18 VITALS — BP 146/81 | HR 64 | Temp 98.9°F | Ht 64.0 in | Wt 217.0 lb

## 2020-02-18 DIAGNOSIS — Z79891 Long term (current) use of opiate analgesic: Secondary | ICD-10-CM

## 2020-02-18 DIAGNOSIS — M961 Postlaminectomy syndrome, not elsewhere classified: Secondary | ICD-10-CM

## 2020-02-18 DIAGNOSIS — M4807 Spinal stenosis, lumbosacral region: Secondary | ICD-10-CM | POA: Diagnosis not present

## 2020-02-18 DIAGNOSIS — G894 Chronic pain syndrome: Secondary | ICD-10-CM | POA: Insufficient documentation

## 2020-02-18 DIAGNOSIS — M5414 Radiculopathy, thoracic region: Secondary | ICD-10-CM | POA: Diagnosis not present

## 2020-02-18 DIAGNOSIS — M25551 Pain in right hip: Secondary | ICD-10-CM | POA: Diagnosis not present

## 2020-02-18 DIAGNOSIS — G8929 Other chronic pain: Secondary | ICD-10-CM | POA: Diagnosis not present

## 2020-02-18 DIAGNOSIS — M7071 Other bursitis of hip, right hip: Secondary | ICD-10-CM | POA: Insufficient documentation

## 2020-02-18 DIAGNOSIS — M62838 Other muscle spasm: Secondary | ICD-10-CM

## 2020-02-18 DIAGNOSIS — M5417 Radiculopathy, lumbosacral region: Secondary | ICD-10-CM | POA: Insufficient documentation

## 2020-02-18 DIAGNOSIS — M5416 Radiculopathy, lumbar region: Secondary | ICD-10-CM | POA: Diagnosis not present

## 2020-02-18 DIAGNOSIS — Z5181 Encounter for therapeutic drug level monitoring: Secondary | ICD-10-CM

## 2020-02-18 MED ORDER — MORPHINE SULFATE 15 MG PO TABS: 15.0000 mg | ORAL_TABLET | Freq: Two times a day (BID) | ORAL | 0 refills | Status: DC | PRN

## 2020-02-18 MED ORDER — MORPHINE SULFATE ER 15 MG PO TBCR: 15.0000 mg | EXTENDED_RELEASE_TABLET | Freq: Three times a day (TID) | ORAL | 0 refills | Status: DC

## 2020-02-18 NOTE — Progress Notes (Signed)
Subjective:    Patient ID: [name] [name], female    DOB: 05-28-1956, 64 y.o.   MRN: [id number]  HPI: [name] [name] is a 64 y.o. female who returns for follow up appointment for chronic pain and medication refill. She states her pain is located in her lower back radiating into her right buttock and right lower extremity. She rates her pain 9. Her  current exercise regime is walking and performing stretching exercises.  [name] Morphine equivalent is 75.00  MME. Oral Swab was Performed Today.     Pain Inventory Average Pain 9 Pain Right Now 9 My pain is constant, sharp, burning, stabbing, tingling and aching  In the last 24 hours, has pain interfered with the following? General activity 10 Relation with others 10 Enjoyment of life 10 What TIME of day is your pain at its worst? morning , daytime, evening and night Sleep (in general) Poor  Pain is worse with: sitting, inactivity and standing Pain improves with: rest, heat/ice and medication Relief from Meds: 1  Family History  Problem Relation Age of Onset  . Heart disease Mother 72       CHF; CAD  . Hypertension Mother   . Heart failure Mother   . Diabetes Sister   . Lupus Sister   . Kidney failure Sister   . Hypertension Brother   . Hyperlipidemia Sister   . Hypertension Sister   . Colon cancer Neg Hx   . Rectal cancer Neg Hx    Social History   Socioeconomic History  . Marital status: Widowed    Spouse name: Not on file  . Number of children: 3  . Years of education: Not on file  . Highest education level: Not on file  Occupational History  . 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 Smoking status: Former Smoker    Packs/day: 0.50    Years: 20.00    Pack years: 10.00    Types: Cigarettes    Quit date: 07/21/2008    Years since quitting: 11.5  . Smokeless tobacco: Never Used  Vaping Use  . Vaping Use: Never used  Substance and Sexual Activity  . Alcohol use: No    . Drug use: No  . Sexual activity: Not on file  Other Topics Concern  . Not on file  Social History Narrative   Marital status: widowed since 2002; not dating which is bad in 2018      Children: 3 children (45, 22, 44); 13 grandchildren; 1 gg      Employment: disability since 2009; retail      Lives: alone in [place] in apartment      Tobacco: quit in 2011      Alcohol: none      Drugs: none      Exercise: minimal in 2018      ADLs: independent with ADLs; drives      Seatbelt: 100%; no texting.   Social Determinants of Health   Financial Resource Strain:   . Difficulty of Paying Living Expenses: Not on file  Food Insecurity:   . Worried About Charity fundraiser in the Last Year: Not on file  . Ran Out of Food in the Last Year: Not on file  Transportation Needs:   . Lack of Transportation (Medical): Not on file  . Lack of Transportation (Non-Medical): Not on file  Physical Activity:   . Days of Exercise per Week: Not  on file  . Minutes of Exercise per Session: Not on file  Stress:   . Feeling of Stress : Not on file  Social Connections:   . Frequency of Communication with Friends and Family: Not on file  . Frequency of Social Gatherings with Friends and Family: Not on file  . Attends Religious Services: Not on file  . Active Member of Clubs or Organizations: Not on file  . Attends Archivist Meetings: Not on file  . Marital Status: Not on file   Past Surgical History:  Procedure Laterality Date  . ABDOMINAL HYSTERECTOMY  1982   DUB; ovaries intact  . bladder surg  1992   bladder retraction  . BREAST BIOPSY    . BREAST REDUCTION SURGERY  1984  . CARDIAC CATHETERIZATION  1990's   [name]  . COLON SURGERY  05/11/11   [facility] L[facility] of adhesions Crohn's  . MASS EXCISION Left 01/03/2018   Procedure: EXCISION LEFT UPPER BACK LIPOMA ERAS PATH;  Surgeon: Erroll Luna, MD;  Location: [facility];  Service: General;  Laterality: Left;  .  REDUCTION MAMMAPLASTY    . SPINE SURGERY     5 total (1 upper, 4 lumbar)   Past Surgical History:  Procedure Laterality Date  . ABDOMINAL HYSTERECTOMY  1982   DUB; ovaries intact  . bladder surg  1992   bladder retraction  . BREAST BIOPSY    . BREAST REDUCTION SURGERY  1984  . CARDIAC CATHETERIZATION  1990's   [name]  . COLON SURGERY  05/11/11   [facility] Lysis of adhesions Crohn's  . MASS EXCISION Left 01/03/2018   Procedure: EXCISION LEFT UPPER BACK LIPOMA ERAS PATH;  Surgeon: Erroll Luna, MD;  Location: [facility];  Service: General;  Laterality: Left;  . REDUCTION MAMMAPLASTY    . SPINE SURGERY     5 total (1 upper, 4 lumbar)   Past Medical History:  Diagnosis Date  . Anemia   . Blood transfusion without reported diagnosis   . Crohn's disease ([place])   . Disorder of sacroiliac joint   . Hyperlipidemia   . Hypertension   . Ischemic colitis ([place])   . Lumbago   . Lumbar post-laminectomy syndrome   . Lumbosacral neuritis   . Lumbosacral radiculitis   . Sciatica    Temp 98.9 F (37.2 C)   Ht 5\' 4"  (1.626 m)   Wt 217 lb (98.4 kg)   BMI 37.25 kg/m   Opioid Risk Score:   Fall Risk Score:  `1  Depression screen PHQ 2/9  Depression screen [facility] 2/9 01/09/2020 12/11/2019 11/07/2019 11/01/2019 10/14/2019 04/18/2019 04/15/2019  Decreased Interest 0 0 3 3 0 0 0  Down, Depressed, Hopeless 0 0 1 1 0 0 0  PHQ - 2 Score 0 0 4 4 0 0 0  Altered sleeping - 0 3 3 - - -  Tired, decreased energy - 0 3 3 - - -  Change in appetite - 0 3 3 - - -  Feeling bad or failure about yourself  - 0 2 1 - - -  Trouble concentrating - 0 0 0 - - -  Moving slowly or fidgety/restless - 0 0 0 - - -  Suicidal thoughts - 0 0 - - - -  PHQ-9 Score - 0 15 14 - - -  Difficult doing work/chores - - Extremely dIfficult Extremely dIfficult - - -  Some recent data might be hidden   Review of  Systems  Constitutional: Negative.   HENT: Negative.   Eyes: Negative.   Respiratory: Negative.     Cardiovascular: Negative.   Gastrointestinal: Negative.   Endocrine: Negative.   Genitourinary: Negative.   Musculoskeletal: Positive for back pain and gait problem.  Skin: Negative.   Allergic/Immunologic: Negative.   Neurological: Positive for weakness and numbness.       Tingling   Hematological: Negative.   All other systems reviewed and are negative.      Objective:   Physical Exam Vitals and nursing note reviewed.  Constitutional:      Appearance: Normal appearance.  Cardiovascular:     Rate and Rhythm: Normal rate and regular rhythm.     Pulses: Normal pulses.     Heart sounds: Normal heart sounds.  Pulmonary:     Effort: Pulmonary effort is normal.     Breath sounds: Normal breath sounds.  Musculoskeletal:     Cervical back: Normal range of motion and neck supple.     Comments: Normal Muscle Bulk and Muscle Testing Reveals:  Upper Extremities: Full ROM and Muscle Strength 5/5  Lumbar Hypersensitivity Lower Extremities: Full ROM and Muscle Strength 5/5 Arises from Table Slowly using cane for support Antalgic Gait   Skin:    General: Skin is warm and dry.  Neurological:     Mental Status: She is alert and oriented to person, place, and time.  Psychiatric:        Mood and Affect: Mood normal.        Behavior: Behavior normal.           Assessment & Plan:  1.Lumbar Postlaminectomy/ Spinal Stenosis Lumbar Region/ Post-Op Pain/Low back pain with radiating symptoms in L3-4 distribution: continues to be limited by pain. Refilled:MSIR 15 mg one tablet twice a day as needed for moderate to sever pain#60andMorphine Sulfate ER15 mg Q 8 hours.Second script e-scribed to accommodate scheduled appointment. 02/18/2020. We will continue the opioid monitoring program, this consists of regular clinic visits, examinations, urine drug screen, pill counts as well as use of [place] Controlled Substance Reporting system. A 12 month History has been reviewed on the  [place] Controlled Substance Reporting System on 02/18/2020.  Encouraged to Continue exercise regime.  2. Lumbar Radiculopathy:ContinueTopamax:.Continue to Monitor08/31/2021 3.ChronicRight Knee Pain:No complaints today.Continue with Ice TherapyandVoltaren Gel.02/18/2020 4. Muscle Spasm: Continuecurrent treatment: Tizanidine.02/18/2020 5. Lipoma: S/P Left Lipoma Excision of upper back By Dr. Brantley Stage on 01/03/2018. Surgery Following. Continue to Monitor.02/18/2020. 6. Paresthesia of Skin: ContinueTopamax.02/18/2020 7. Adhesive Capsulitis of Right Shoulder: S/PU/S Cortisoneinjectionon 01/13/2020with some relief noted.Continue to monitor. Continue HEP as Tolerated.02/18/2020   66minutes of face to face patient care time was spent during this visit. All questions were encouraged and answered.  F/U in 1 month

## 2020-02-21 ENCOUNTER — Other Ambulatory Visit: Payer: Self-pay | Admitting: Family Medicine

## 2020-02-21 DIAGNOSIS — I10 Essential (primary) hypertension: Secondary | ICD-10-CM

## 2020-02-21 LAB — DRUG TOX MONITOR 1 W/CONF, ORAL FLD

## 2020-02-21 LAB — DRUG TOX ALC METAB W/CON, ORAL FLD: Alcohol Metabolite: NEGATIVE ng/mL (ref <25)

## 2020-02-26 ENCOUNTER — Telehealth: Payer: Self-pay | Admitting: *Deleted

## 2020-02-26 NOTE — Telephone Encounter (Addendum)
Oral swab drug screen was consistent for prescribed medications.  ?

## 2020-02-27 ENCOUNTER — Other Ambulatory Visit: Payer: Self-pay | Admitting: Family Medicine

## 2020-02-27 DIAGNOSIS — R600 Localized edema: Secondary | ICD-10-CM

## 2020-03-04 DIAGNOSIS — Z1152 Encounter for screening for COVID-19: Secondary | ICD-10-CM | POA: Diagnosis not present

## 2020-03-06 DIAGNOSIS — M545 Low back pain: Secondary | ICD-10-CM | POA: Diagnosis not present

## 2020-03-06 DIAGNOSIS — M961 Postlaminectomy syndrome, not elsewhere classified: Secondary | ICD-10-CM | POA: Diagnosis not present

## 2020-03-06 DIAGNOSIS — M5416 Radiculopathy, lumbar region: Secondary | ICD-10-CM | POA: Diagnosis not present

## 2020-03-06 DIAGNOSIS — G894 Chronic pain syndrome: Secondary | ICD-10-CM | POA: Diagnosis not present

## 2020-03-11 ENCOUNTER — Telehealth: Payer: Self-pay | Admitting: *Deleted

## 2020-03-11 DIAGNOSIS — M961 Postlaminectomy syndrome, not elsewhere classified: Secondary | ICD-10-CM | POA: Diagnosis not present

## 2020-03-11 DIAGNOSIS — Z9689 Presence of other specified functional implants: Secondary | ICD-10-CM | POA: Diagnosis not present

## 2020-03-11 MED ORDER — MORPHINE SULFATE 15 MG PO TABS: 15.0000 mg | ORAL_TABLET | Freq: Two times a day (BID) | ORAL | 0 refills | Status: DC | PRN

## 2020-03-11 NOTE — Telephone Encounter (Signed)
PMP was reviewed. MSIR e-scribed today.  Placed a call to Ms Heng regarding the above. She verbalizes understanding.

## 2020-03-11 NOTE — Telephone Encounter (Signed)
Ms Budreau called and her pharmacy doesn't have the [place] and wont get until Friday at earliest.  She is requesting it be sent somewhere else.  I have canceled at [facility] and it should be able to be filled at [facility] [address].  I have changed pharmacy for you.

## 2020-03-19 ENCOUNTER — Other Ambulatory Visit: Payer: Self-pay

## 2020-03-19 ENCOUNTER — Encounter: Payer: Self-pay | Admitting: Registered Nurse

## 2020-03-19 ENCOUNTER — Encounter: Payer: Medicare Other | Attending: Physical Medicine & Rehabilitation | Admitting: Registered Nurse

## 2020-03-19 VITALS — BP 131/82 | HR 61 | Temp 98.6°F | Ht 64.0 in | Wt 217.2 lb

## 2020-03-19 DIAGNOSIS — Z5181 Encounter for therapeutic drug level monitoring: Secondary | ICD-10-CM | POA: Diagnosis not present

## 2020-03-19 DIAGNOSIS — Z79891 Long term (current) use of opiate analgesic: Secondary | ICD-10-CM

## 2020-03-19 DIAGNOSIS — M25551 Pain in right hip: Secondary | ICD-10-CM | POA: Diagnosis not present

## 2020-03-19 DIAGNOSIS — M961 Postlaminectomy syndrome, not elsewhere classified: Secondary | ICD-10-CM | POA: Diagnosis not present

## 2020-03-19 DIAGNOSIS — M7071 Other bursitis of hip, right hip: Secondary | ICD-10-CM | POA: Insufficient documentation

## 2020-03-19 DIAGNOSIS — M5417 Radiculopathy, lumbosacral region: Secondary | ICD-10-CM | POA: Diagnosis not present

## 2020-03-19 DIAGNOSIS — M5416 Radiculopathy, lumbar region: Secondary | ICD-10-CM | POA: Diagnosis not present

## 2020-03-19 DIAGNOSIS — M4807 Spinal stenosis, lumbosacral region: Secondary | ICD-10-CM

## 2020-03-19 DIAGNOSIS — G8929 Other chronic pain: Secondary | ICD-10-CM | POA: Diagnosis not present

## 2020-03-19 DIAGNOSIS — Z9689 Presence of other specified functional implants: Secondary | ICD-10-CM | POA: Diagnosis not present

## 2020-03-19 DIAGNOSIS — M5414 Radiculopathy, thoracic region: Secondary | ICD-10-CM | POA: Diagnosis not present

## 2020-03-19 DIAGNOSIS — G894 Chronic pain syndrome: Secondary | ICD-10-CM | POA: Diagnosis not present

## 2020-03-19 MED ORDER — MORPHINE SULFATE ER 15 MG PO TBCR: 15.0000 mg | EXTENDED_RELEASE_TABLET | Freq: Three times a day (TID) | ORAL | 0 refills | Status: DC

## 2020-03-19 MED ORDER — MORPHINE SULFATE 15 MG PO TABS: 15.0000 mg | ORAL_TABLET | Freq: Two times a day (BID) | ORAL | 0 refills | Status: DC | PRN

## 2020-03-19 NOTE — Progress Notes (Signed)
Subjective:    Patient ID: [name] [name], female    DOB: Aug 05, 1955, 64 y.o.   MRN: [id number]  HPI: HENRY DEMERITT is a 64 y.o. female who returns for follow up appointment for chronic pain and medication refill. She states her  pain is located in her lower back radiating into her right hip. She rates her pain 5. Her current exercise regime is walking short distances.   Ms. Lanza states she had a spinal stimulator placed on 03/06/2020 at [facility], dressing intact.   Ms. Noto Morphine equivalent is 75.00 MME.  Oral Swab was Performed on 02/18/2020, it was consistent.    Pain Inventory Average Pain 5 Pain Right Now 5 My pain is constant, dull and aching  In the last 24 hours, has pain interfered with the following? General activity 5 Relation with others 5 Enjoyment of life 5 What TIME of day is your pain at its worst? morning , daytime, evening and night Sleep (in general) Poor  Pain is worse with: sitting, inactivity and standing Pain improves with: rest, heat/ice, medication and TENS Relief from Meds: 2  Family History  Problem Relation Age of Onset  . Heart disease Mother 33       CHF; CAD  . Hypertension Mother   . Heart failure Mother   . Diabetes Sister   . Lupus Sister   . Kidney failure Sister   . Hypertension Brother   . Hyperlipidemia Sister   . Hypertension Sister   . Colon cancer Neg Hx   . Rectal cancer Neg Hx    Social History   Socioeconomic History  . Marital status: Widowed    Spouse name: Not on file  . Number of children: 3  . Years of education: Not on file  . Highest education level: Not on file  Occupational History  . 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 Smoking status: Former Smoker    Packs/day: 0.50    Years: 20.00    Pack years: 10.00    Types: Cigarettes    Quit date: 07/21/2008    Years since quitting: 11.6  . Smokeless tobacco: Never Used  Vaping Use  . Vaping Use:  Never used  Substance and Sexual Activity  . Alcohol use: No  . Drug use: No  . Sexual activity: Not on file  Other Topics Concern  . Not on file  Social History Narrative   Marital status: widowed since 2002; not dating which is bad in 2018      Children: 3 children (45, 48, 79); 13 grandchildren; 1 gg      Employment: disability since 2009; retail      Lives: alone in [place] in apartment      Tobacco: quit in 2011      Alcohol: none      Drugs: none      Exercise: minimal in 2018      ADLs: independent with ADLs; drives      Seatbelt: 100%; no texting.   Social Determinants of Health   Financial Resource Strain:   . Difficulty of Paying Living Expenses: Not on file  Food Insecurity:   . Worried About Charity fundraiser in the Last Year: Not on file  . Ran Out of Food in the Last Year: Not on file  Transportation Needs:   . Lack of Transportation (Medical): Not on file  . Lack of Transportation (Non-Medical): Not  on file  Physical Activity:   . Days of Exercise per Week: Not on file  . Minutes of Exercise per Session: Not on file  Stress:   . Feeling of Stress : Not on file  Social Connections:   . Frequency of Communication with Friends and Family: Not on file  . Frequency of Social Gatherings with Friends and Family: Not on file  . Attends Religious Services: Not on file  . Active Member of Clubs or Organizations: Not on file  . Attends Archivist Meetings: Not on file  . Marital Status: Not on file   Past Surgical History:  Procedure Laterality Date  . ABDOMINAL HYSTERECTOMY  1982   DUB; ovaries intact  . bladder surg  1992   bladder retraction  . BREAST BIOPSY    . BREAST REDUCTION SURGERY  1984  . CARDIAC CATHETERIZATION  1990's   [name]  . COLON SURGERY  05/11/11   [facility] Lysis of adhesions Crohn's  . MASS EXCISION Left 01/03/2018   Procedure: EXCISION LEFT UPPER BACK LIPOMA ERAS PATH;  Surgeon: Erroll Luna, MD;  Location: [facility];  Service: General;  Laterality: Left;  . REDUCTION MAMMAPLASTY    . SPINE SURGERY     5 total (1 upper, 4 lumbar)   Past Surgical History:  Procedure Laterality Date  . ABDOMINAL HYSTERECTOMY  1982   DUB; ovaries intact  . bladder surg  1992   bladder retraction  . BREAST BIOPSY    . BREAST REDUCTION SURGERY  1984  . CARDIAC CATHETERIZATION  1990's   [name]  . COLON SURGERY  05/11/11   [facility] Lysis of adhesions Crohn's  . MASS EXCISION Left 01/03/2018   Procedure: EXCISION LEFT UPPER BACK LIPOMA ERAS PATH;  Surgeon: Erroll Luna, MD;  Location: [facility];  Service: General;  Laterality: Left;  . REDUCTION MAMMAPLASTY    . SPINE SURGERY     5 total (1 upper, 4 lumbar)   Past Medical History:  Diagnosis Date  . Anemia   . Blood transfusion without reported diagnosis   . Crohn's disease ([place])   . Disorder of sacroiliac joint   . Hyperlipidemia   . Hypertension   . Ischemic colitis ([place])   . Lumbago   . Lumbar post-laminectomy syndrome   . Lumbosacral neuritis   . Lumbosacral radiculitis   . Sciatica    BP 131/82   Pulse 61   Temp 98.6 F (37 C)   Ht 5\' 4"  (1.626 m)   Wt 217 lb 3.2 oz (98.5 kg)   SpO2 95%   BMI 37.28 kg/m   Opioid Risk Score:   Fall Risk Score:  `1  Depression screen PHQ 2/9  Depression screen [facility] 2/9 03/19/2020 01/09/2020 12/11/2019 11/07/2019 11/01/2019 10/14/2019 04/18/2019  Decreased Interest 0 0 0 3 3 0 0  Down, Depressed, Hopeless 0 0 0 1 1 0 0  PHQ - 2 Score 0 0 0 4 4 0 0  Altered sleeping - - 0 3 3 - -  Tired, decreased energy - - 0 3 3 - -  Change in appetite - - 0 3 3 - -  Feeling bad or failure about yourself  - - 0 2 1 - -  Trouble concentrating - - 0 0 0 - -  Moving slowly or fidgety/restless - - 0 0 0 - -  Suicidal thoughts - - 0 0 - - -  PHQ-9 Score - - 0  15 14 - -  Difficult doing work/chores - - - Extremely dIfficult Extremely dIfficult - -  Some recent data might be hidden   Review of  Systems  Musculoskeletal: Positive for back pain.       Hip pain  Neurological: Positive for weakness and numbness.  All other systems reviewed and are negative.      Objective:   Physical Exam Vitals and nursing note reviewed.  Constitutional:      Appearance: Normal appearance.  Cardiovascular:     Rate and Rhythm: Normal rate and regular rhythm.     Pulses: Normal pulses.     Heart sounds: Normal heart sounds.  Pulmonary:     Effort: Pulmonary effort is normal.     Breath sounds: Normal breath sounds.  Musculoskeletal:     Cervical back: Normal range of motion and neck supple.     Comments: Normal Muscle Bulk and Muscle Testing Reveals:  Upper Extremities: Full ROM and Muscle Strength 5/5 Lumbar Paraspinal Tenderness: L-3-L-5 Lumbar Dressing Intact Lower Extremities: Full ROM and Muscle Strength 5/5 Arises from Table slowly Narrow Based Gait   Skin:    General: Skin is warm and dry.  Neurological:     Mental Status: She is alert and oriented to person, place, and time.  Psychiatric:        Mood and Affect: Mood normal.        Behavior: Behavior normal.           Assessment & Plan:  1.Lumbar Postlaminectomy/ Spinal Stenosis Lumbar Region/ Post-Op Pain/Low back pain with radiating symptoms in L3-4 distribution: continues to be limited by pain. Refilled:MSIR 15 mg one tablet twice a day as needed for moderate to sever pain#60andMorphine Sulfate ER15 mg Q 8 hours.Second script e-scribed to accommodate scheduled appointment.03/19/2020. We will continue the opioid monitoring program, this consists of regular clinic visits, examinations, urine drug screen, pill counts as well as use of [place] Controlled Substance Reporting system. A 12 month History has been reviewed on the [place] Controlled Substance Reporting System on 03/19/2020.  Encouraged to Continue exercise regime.  2. Lumbar Radiculopathy:ContinueTopamax:.Continue to  Monitor09/30/2021 3.ChronicRight Knee Pain:No complaints today.Continue with Ice TherapyandVoltaren Gel.03/19/2020 4. Muscle Spasm: Continuecurrent treatment: Tizanidine.03/19/2020 5. Lipoma: S/P Left Lipoma Excision of upper back By Dr. Brantley Stage on 01/03/2018. Surgery Following. Continue to Monitor.03/19/2020. 6. Paresthesia of Skin: ContinueTopamax.03/19/2020 7. Adhesive Capsulitis of Right Shoulder: No complaints today. S/PU/S Cortisoneinjectionon 01/13/2020with some relief noted.Continue to monitor. Continue HEP as Tolerated.03/19/2020   85minutes of face to face patient care time was spent during this visit. All questions were encouraged and answered.  F/U in 1 month

## 2020-03-27 ENCOUNTER — Other Ambulatory Visit: Payer: Self-pay | Admitting: Family Medicine

## 2020-03-27 DIAGNOSIS — E7800 Pure hypercholesterolemia, unspecified: Secondary | ICD-10-CM

## 2020-04-01 ENCOUNTER — Other Ambulatory Visit: Payer: Self-pay | Admitting: Family Medicine

## 2020-04-01 DIAGNOSIS — I10 Essential (primary) hypertension: Secondary | ICD-10-CM

## 2020-04-15 DIAGNOSIS — G894 Chronic pain syndrome: Secondary | ICD-10-CM | POA: Diagnosis not present

## 2020-04-15 DIAGNOSIS — M961 Postlaminectomy syndrome, not elsewhere classified: Secondary | ICD-10-CM | POA: Diagnosis not present

## 2020-04-15 DIAGNOSIS — Z9689 Presence of other specified functional implants: Secondary | ICD-10-CM | POA: Diagnosis not present

## 2020-04-22 ENCOUNTER — Encounter: Payer: Self-pay | Admitting: Registered Nurse

## 2020-04-22 ENCOUNTER — Ambulatory Visit: Payer: Medicare Other | Admitting: Registered Nurse

## 2020-04-22 ENCOUNTER — Encounter: Payer: Medicare Other | Attending: Physical Medicine & Rehabilitation | Admitting: Registered Nurse

## 2020-04-22 ENCOUNTER — Other Ambulatory Visit: Payer: Self-pay

## 2020-04-22 VITALS — BP 147/70 | HR 60 | Temp 98.4°F | Ht 64.0 in | Wt 222.0 lb

## 2020-04-22 DIAGNOSIS — G894 Chronic pain syndrome: Secondary | ICD-10-CM | POA: Diagnosis not present

## 2020-04-22 DIAGNOSIS — Z79891 Long term (current) use of opiate analgesic: Secondary | ICD-10-CM

## 2020-04-22 DIAGNOSIS — M961 Postlaminectomy syndrome, not elsewhere classified: Secondary | ICD-10-CM | POA: Diagnosis not present

## 2020-04-22 DIAGNOSIS — M4807 Spinal stenosis, lumbosacral region: Secondary | ICD-10-CM

## 2020-04-22 DIAGNOSIS — M62838 Other muscle spasm: Secondary | ICD-10-CM

## 2020-04-22 DIAGNOSIS — Z5181 Encounter for therapeutic drug level monitoring: Secondary | ICD-10-CM

## 2020-04-22 MED ORDER — MORPHINE SULFATE 15 MG PO TABS: 15.0000 mg | ORAL_TABLET | Freq: Two times a day (BID) | ORAL | 0 refills | Status: DC | PRN

## 2020-04-22 MED ORDER — MORPHINE SULFATE ER 15 MG PO TBCR: 15.0000 mg | EXTENDED_RELEASE_TABLET | Freq: Three times a day (TID) | ORAL | 0 refills | Status: DC

## 2020-04-22 NOTE — Progress Notes (Signed)
Subjective:    Patient ID: [name] [name], female    DOB: August 16, 1955, 64 y.o.   MRN: [id number]  HPI: [name] [name] is a 64 y.o. female who returns for follow up appointment for chronic pain and medication refill. She states her pain is located in her lower back. She rates her pain 3. Her. current exercise regime is walking and performing stretching exercises.  [name] equivalent is 75.00  MME.  Last Oral Swab was Performed on 02/18/2020, it was consistent.   Pain Inventory Average Pain 3 Pain Right Now 3 My pain is intermittent and aching  In the last 24 hours, has pain interfered with the following? General activity 4 Relation with others 4 Enjoyment of life 4 What TIME of day is your pain at its worst? morning , evening and night Sleep (in general) Fair  Pain is worse with: standing Pain improves with: rest, heat/ice, medication and TENS Relief from Meds: 2  Family History  Problem Relation Age of Onset  . Heart disease Mother 60       CHF; CAD  . Hypertension Mother   . Heart failure Mother   . Diabetes Sister   . Lupus Sister   . Kidney failure Sister   . Hypertension Brother   . Hyperlipidemia Sister   . Hypertension Sister   . Colon cancer Neg Hx   . Rectal cancer Neg Hx    Social History   Socioeconomic History  . Marital status: Widowed    Spouse name: Not on file  . Number of children: 3  . Years of education: Not on file  . Highest education level: Not on file  Occupational History  . 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 Smoking status: Former Smoker    Packs/day: 0.50    Years: 20.00    Pack years: 10.00    Types: Cigarettes    Quit date: 07/21/2008    Years since quitting: 11.7  . Smokeless tobacco: Never Used  Vaping Use  . Vaping Use: Never used  Substance and Sexual Activity  . Alcohol use: No  . Drug use: No  . Sexual activity: Not on file  Other Topics Concern  . Not on file    Social History Narrative   Marital status: widowed since 2002; not dating which is bad in 2018      Children: 3 children (45, 61, 70); 13 grandchildren; 1 gg      Employment: disability since 2009; retail      Lives: alone in [place] in apartment      Tobacco: quit in 2011      Alcohol: none      Drugs: none      Exercise: minimal in 2018      ADLs: independent with ADLs; drives      Seatbelt: 100%; no texting.   Social Determinants of Health   Financial Resource Strain:   . Difficulty of Paying Living Expenses: Not on file  Food Insecurity:   . Worried About Charity fundraiser in the Last Year: Not on file  . Ran Out of Food in the Last Year: Not on file  Transportation Needs:   . Lack of Transportation (Medical): Not on file  . Lack of Transportation (Non-Medical): Not on file  Physical Activity:   . Days of Exercise per Week: Not on file  . Minutes of Exercise per Session: Not on file  Stress:   . Feeling of Stress : Not on file  Social Connections:   . Frequency of Communication with Friends and Family: Not on file  . Frequency of Social Gatherings with Friends and Family: Not on file  . Attends Religious Services: Not on file  . Active Member of Clubs or Organizations: Not on file  . Attends Archivist Meetings: Not on file  . Marital Status: Not on file   Past Surgical History:  Procedure Laterality Date  . ABDOMINAL HYSTERECTOMY  1982   DUB; ovaries intact  . bladder surg  1992   bladder retraction  . BREAST BIOPSY    . BREAST REDUCTION SURGERY  1984  . CARDIAC CATHETERIZATION  1990's   [name] [name]  . COLON SURGERY  05/11/11   [facility] Lysis of adhesions Crohn's  . MASS EXCISION Left 01/03/2018   Procedure: EXCISION LEFT UPPER BACK LIPOMA ERAS PATH;  Surgeon: Erroll Luna, MD;  Location: [facility];  Service: General;  Laterality: Left;  . REDUCTION MAMMAPLASTY    . SPINE SURGERY     5 total (1 upper, 4 lumbar)   Past Surgical  History:  Procedure Laterality Date  . ABDOMINAL HYSTERECTOMY  1982   DUB; ovaries intact  . bladder surg  1992   bladder retraction  . BREAST BIOPSY    . BREAST REDUCTION SURGERY  1984  . CARDIAC CATHETERIZATION  1990's   [name] [name]  . COLON SURGERY  05/11/11   [facility] Lysis of adhesions Crohn's  . MASS EXCISION Left 01/03/2018   Procedure: EXCISION LEFT UPPER BACK LIPOMA ERAS PATH;  Surgeon: Erroll Luna, MD;  Location: [facility];  Service: General;  Laterality: Left;  . REDUCTION MAMMAPLASTY    . SPINE SURGERY     5 total (1 upper, 4 lumbar)   Past Medical History:  Diagnosis Date  . Anemia   . Blood transfusion without reported diagnosis   . Crohn's disease ([place])   . Disorder of sacroiliac joint   . Hyperlipidemia   . Hypertension   . Ischemic colitis ([place])   . Lumbago   . Lumbar post-laminectomy syndrome   . Lumbosacral neuritis   . Lumbosacral radiculitis   . Sciatica    BP (!) 147/70   Pulse (!) 53   Temp 98.4 F (36.9 C)   Ht 5\' 4"  (1.626 m)   Wt 222 lb (100.7 kg)   SpO2 95%   BMI 38.11 kg/m   Opioid Risk Score:   Fall Risk Score:  `1  Depression screen PHQ 2/9  Depression screen [facility] 2/9 03/19/2020 01/09/2020 12/11/2019 11/07/2019 11/01/2019 10/14/2019 04/18/2019  Decreased Interest 0 0 0 3 3 0 0  Down, Depressed, Hopeless 0 0 0 1 1 0 0  PHQ - 2 Score 0 0 0 4 4 0 0  Altered sleeping - - 0 3 3 - -  Tired, decreased energy - - 0 3 3 - -  Change in appetite - - 0 3 3 - -  Feeling bad or failure about yourself  - - 0 2 1 - -  Trouble concentrating - - 0 0 0 - -  Moving slowly or fidgety/restless - - 0 0 0 - -  Suicidal thoughts - - 0 0 - - -  PHQ-9 Score - - 0 15 14 - -  Difficult doing work/chores - - - Extremely dIfficult Extremely dIfficult - -  Some recent data might be hidden  Review of Systems  Constitutional: Negative.   HENT: Negative.   Eyes: Negative.   Respiratory: Negative.   Cardiovascular: Negative.     Gastrointestinal: Negative.   Endocrine: Negative.   Genitourinary: Negative.   Musculoskeletal: Positive for back pain.  Skin: Negative.   Allergic/Immunologic: Negative.   Neurological: Negative.   Hematological: Negative.   Psychiatric/Behavioral: Negative.   All other systems reviewed and are negative.      Objective:   Physical Exam Vitals and nursing note reviewed.  Constitutional:      Appearance: Normal appearance.  Cardiovascular:     Rate and Rhythm: Normal rate and regular rhythm.     Pulses: Normal pulses.     Heart sounds: Normal heart sounds.  Pulmonary:     Effort: Pulmonary effort is normal.     Breath sounds: Normal breath sounds.  Musculoskeletal:     Cervical back: Normal range of motion and neck supple.     Comments: Normal Muscle Bulk and Muscle Testing Reveals:  Upper Extremities: Full ROM and Muscle Strength 5/5  Lumbar Paraspinal Tenderness: L-3-L-5 Lower Extremities: Full ROM and Muscle Strength 5/5 Arises from Table with ease Narrow Based Gait   Skin:    General: Skin is warm and dry.  Neurological:     Mental Status: She is alert and oriented to person, place, and time.  Psychiatric:        Mood and Affect: Mood normal.        Behavior: Behavior normal.           Assessment & Plan:  1.Lumbar Postlaminectomy/ Spinal Stenosis Lumbar Region/ Post-Op Pain/Low back pain with radiating symptoms in L3-4 distribution: continues to be limited by pain. Refilled:MSIR 15 mg one tablet twice a day as needed for moderate to sever pain#60andMorphine Sulfate ER15 mg Q 8 hours.Second script e-scribed to accommodate scheduled appointment.04/22/2020. We will continue the opioid monitoring program, this consists of regular clinic visits, examinations, urine drug screen, pill counts as well as use of [place] Controlled Substance Reporting system. A 12 month History has been reviewed on the [place] Controlled Substance Reporting Systemon  04/22/2020.Encouraged to Continue exercise regime.  2. Lumbar Radiculopathy:ContinueTopamax:.Continue to Monitor11/08/2019 3.ChronicRight Knee Pain:No complaints today.Continue with Ice TherapyandVoltaren Gel.04/22/2020 4. Muscle Spasm: Continuecurrent treatment: Tizanidine.04/22/2020 5. Lipoma: S/P Left Lipoma Excision of upper back By Dr. Brantley Stage on 01/03/2018. Surgery Following. Continue to Monitor.04/22/2020. 6. Paresthesia of Skin: ContinueTopamax.04/22/2020 7. Adhesive Capsulitis of Right Shoulder: No complaints today. S/PU/S Cortisoneinjectionon 01/13/2020with some relief noted.Continue to monitor. Continue HEP as Tolerated.04/22/2020   51minutes of face to face patient care time was spent during this visit. All questions were encouraged and answered.  F/U in 1 month

## 2020-04-29 ENCOUNTER — Other Ambulatory Visit: Payer: Self-pay | Admitting: Registered Nurse

## 2020-05-02 ENCOUNTER — Other Ambulatory Visit: Payer: Self-pay | Admitting: Registered Nurse

## 2020-05-02 ENCOUNTER — Other Ambulatory Visit: Payer: Self-pay | Admitting: Family Medicine

## 2020-05-02 DIAGNOSIS — J301 Allergic rhinitis due to pollen: Secondary | ICD-10-CM

## 2020-05-02 DIAGNOSIS — R600 Localized edema: Secondary | ICD-10-CM

## 2020-05-13 ENCOUNTER — Other Ambulatory Visit: Payer: Self-pay | Admitting: Family Medicine

## 2020-05-13 DIAGNOSIS — I10 Essential (primary) hypertension: Secondary | ICD-10-CM

## 2020-05-31 ENCOUNTER — Other Ambulatory Visit: Payer: Self-pay | Admitting: Registered Nurse

## 2020-05-31 ENCOUNTER — Other Ambulatory Visit: Payer: Self-pay | Admitting: Family Medicine

## 2020-05-31 DIAGNOSIS — E876 Hypokalemia: Secondary | ICD-10-CM

## 2020-06-02 ENCOUNTER — Encounter: Payer: Self-pay | Admitting: Registered Nurse

## 2020-06-02 ENCOUNTER — Encounter: Payer: Medicare Other | Attending: Physical Medicine & Rehabilitation | Admitting: Registered Nurse

## 2020-06-02 ENCOUNTER — Other Ambulatory Visit: Payer: Self-pay

## 2020-06-02 VITALS — Ht 64.0 in | Wt 216.0 lb

## 2020-06-02 DIAGNOSIS — M961 Postlaminectomy syndrome, not elsewhere classified: Secondary | ICD-10-CM | POA: Diagnosis not present

## 2020-06-02 DIAGNOSIS — Z5181 Encounter for therapeutic drug level monitoring: Secondary | ICD-10-CM | POA: Diagnosis not present

## 2020-06-02 DIAGNOSIS — M25511 Pain in right shoulder: Secondary | ICD-10-CM

## 2020-06-02 DIAGNOSIS — Z79891 Long term (current) use of opiate analgesic: Secondary | ICD-10-CM | POA: Diagnosis not present

## 2020-06-02 DIAGNOSIS — M62838 Other muscle spasm: Secondary | ICD-10-CM

## 2020-06-02 DIAGNOSIS — M4807 Spinal stenosis, lumbosacral region: Secondary | ICD-10-CM | POA: Diagnosis not present

## 2020-06-02 DIAGNOSIS — G894 Chronic pain syndrome: Secondary | ICD-10-CM

## 2020-06-02 MED ORDER — MORPHINE SULFATE 15 MG PO TABS: 15.0000 mg | ORAL_TABLET | Freq: Two times a day (BID) | ORAL | 0 refills | Status: DC | PRN

## 2020-06-02 MED ORDER — MORPHINE SULFATE ER 15 MG PO TBCR: 15.0000 mg | EXTENDED_RELEASE_TABLET | Freq: Three times a day (TID) | ORAL | 0 refills | Status: DC

## 2020-06-02 NOTE — Progress Notes (Signed)
Subjective:    Patient ID: [name] [name], female    DOB: 1955/12/29, 64 y.o.   MRN: [id number]  HPI: [name] [name] is a 64 y.o. female whose appointment was changed to a My-Chart office visit, [name] called office stating she was having diarrhea and abdominal cramps. She believes it was due to salad she ate last night. [name] agrees with My-Chart Video visit and verbalizes understanding. She states she has right shoulder pain, also reports increase shoulder pain after she had back surgery on 03/06/2020, she denies falling. We will order a X-ray, she verbalizes understanding. Also reports lower back pain. She rates her pain 3. Her  current exercise regime is walking and performing stretching exercises.  [name] Morphine equivalent is 75.00 MME.  Last Oral Swab was Performed on 02/18/2020, it was consistent.     Pain Inventory Average Pain 3 Pain Right Now 3 My pain is aching  In the last 24 hours, has pain interfered with the following? General activity 1 Relation with others 1 Enjoyment of life 1 What TIME of day is your pain at its worst? morning  and night Sleep (in general) Fair  Pain is worse with: standing and some activites Pain improves with: rest, heat/ice and medication Relief from Meds: 2  Family History  Problem Relation Age of Onset  . Heart disease Mother 73       CHF; CAD  . Hypertension Mother   . Heart failure Mother   . Diabetes Sister   . Lupus Sister   . Kidney failure Sister   . Hypertension Brother   . Hyperlipidemia Sister   . Hypertension Sister   . Colon cancer Neg Hx   . Rectal cancer Neg Hx    Social History   Socioeconomic History  . Marital status: Widowed    Spouse name: Not on file  . Number of children: 3  . Years of education: Not on file  . Highest education level: Not on file  Occupational History  . 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 Smoking status: Former Smoker     Packs/day: 0.50    Years: 20.00    Pack years: 10.00    Types: Cigarettes    Quit date: 07/21/2008    Years since quitting: 11.8  . Smokeless tobacco: Never Used  Vaping Use  . Vaping Use: Never used  Substance and Sexual Activity  . Alcohol use: No  . Drug use: No  . Sexual activity: Not on file  Other Topics Concern  . Not on file  Social History Narrative   Marital status: widowed since 2002; not dating which is bad in 2018      Children: 3 children (45, 13, 33); 13 grandchildren; 1 gg      Employment: disability since 2009; retail      Lives: alone in [place] in apartment      Tobacco: quit in 2011      Alcohol: none      Drugs: none      Exercise: minimal in 2018      ADLs: independent with ADLs; drives      Seatbelt: 100%; no texting.   Social Determinants of Health   Financial Resource Strain: Not on file  Food Insecurity: Not on file  Transportation Needs: Not on file  Physical Activity: Not on file  Stress: Not on file  Social Connections: Not on file   Past  Surgical History:  Procedure Laterality Date  . ABDOMINAL HYSTERECTOMY  1982   DUB; ovaries intact  . bladder surg  1992   bladder retraction  . BREAST BIOPSY    . BREAST REDUCTION SURGERY  1984  . CARDIAC CATHETERIZATION  1990's   [name]  . COLON SURGERY  05/11/11   [facility] Lysis of adhesions Crohn's  . MASS EXCISION Left 01/03/2018   Procedure: EXCISION LEFT UPPER BACK LIPOMA ERAS PATH;  Surgeon: Erroll Luna, MD;  Location: [facility];  Service: General;  Laterality: Left;  . REDUCTION MAMMAPLASTY    . SPINE SURGERY     5 total (1 upper, 4 lumbar)   Past Surgical History:  Procedure Laterality Date  . ABDOMINAL HYSTERECTOMY  1982   DUB; ovaries intact  . bladder surg  1992   bladder retraction  . BREAST BIOPSY    . BREAST REDUCTION SURGERY  1984  . CARDIAC CATHETERIZATION  1990's   [name]  . COLON SURGERY  05/11/11   [facility] Lysis of adhesions Crohn's  . MASS  EXCISION Left 01/03/2018   Procedure: EXCISION LEFT UPPER BACK LIPOMA ERAS PATH;  Surgeon: Erroll Luna, MD;  Location: [facility];  Service: General;  Laterality: Left;  . REDUCTION MAMMAPLASTY    . SPINE SURGERY     5 total (1 upper, 4 lumbar)   Past Medical History:  Diagnosis Date  . Anemia   . Blood transfusion without reported diagnosis   . Crohn's disease ([place])   . Disorder of sacroiliac joint   . Hyperlipidemia   . Hypertension   . Ischemic colitis ([place])   . Lumbago   . Lumbar post-laminectomy syndrome   . Lumbosacral neuritis   . Lumbosacral radiculitis   . Sciatica    Ht 5\' 4"  (1.626 m)   Wt 216 lb (98 kg)   BMI 37.08 kg/m   Opioid Risk Score:   Fall Risk Score:  `1  Depression screen PHQ 2/9  Depression screen [facility] 2/9 03/19/2020 01/09/2020 12/11/2019 11/07/2019 11/01/2019 10/14/2019 04/18/2019  Decreased Interest 0 0 0 3 3 0 0  Down, Depressed, Hopeless 0 0 0 1 1 0 0  PHQ - 2 Score 0 0 0 4 4 0 0  Altered sleeping - - 0 3 3 - -  Tired, decreased energy - - 0 3 3 - -  Change in appetite - - 0 3 3 - -  Feeling bad or failure about yourself  - - 0 2 1 - -  Trouble concentrating - - 0 0 0 - -  Moving slowly or fidgety/restless - - 0 0 0 - -  Suicidal thoughts - - 0 0 - - -  PHQ-9 Score - - 0 15 14 - -  Difficult doing work/chores - - - Extremely dIfficult Extremely dIfficult - -  Some recent data might be hidden    Review of Systems  Musculoskeletal: Positive for back pain.       Shoulder pain  All other systems reviewed and are negative.      Objective:   Physical Exam Vitals and nursing note reviewed.  Musculoskeletal:     Comments: No Physical Exam Performed: My-Chart Video Visit           Assessment & Plan:  1.Lumbar Postlaminectomy/ Spinal Stenosis Lumbar Region/ Post-Op Pain/Low back pain with radiating symptoms in L3-4 distribution: continues to be limited by pain. Refilled:MSIR 15 mg one tablet twice a day as needed  for  moderate to sever pain#60andMorphine Sulfate ER15 mg Q 8 hours.Second script e-scribed to accommodate scheduled appointment.06/02/2020. We will continue the opioid monitoring program, this consists of regular clinic visits, examinations, urine drug screen, pill counts as well as use of [place] Controlled Substance Reporting system. A 12 month History has been reviewed on the [place] Controlled Substance Reporting Systemon 06/02/2020.Encouraged to Continue exercise regime.  2. Lumbar Radiculopathy:No complaints today. ContinueTopamax:.Continue to Monitor12/14/2021 3.ChronicRight Knee Pain:No complaints today.Continue with Ice TherapyandVoltaren Gel.06/02/2020 4. Muscle Spasm: Continuecurrent treatment: Tizanidine.06/02/2020 5. Lipoma: S/P Left Lipoma Excision of upper back By Dr. Brantley Stage on 01/03/2018. Surgery Following. Continue to Monitor.06/02/2020. 6. Paresthesia of Skin: ContinueTopamax.06/02/2020 7. Adhesive Capsulitis of Right Shoulder:Right Shoulder Pain: RX: Right Shoulder X-Ray. S/PU/S Cortisoneinjectionon 01/13/2020with some relief noted.Continue to monitor. Continue HEP as Tolerated.06/02/2020  F/U 1 Month  My-Chart Video Visit Established Patient  Location of Patient: In Her Home Location of Provider in the Office

## 2020-06-08 ENCOUNTER — Ambulatory Visit
Admission: RE | Admit: 2020-06-08 | Discharge: 2020-06-08 | Disposition: A | Discharge status: Home / Self Care | Payer: Medicare Other | Source: Ambulatory Visit | Attending: Registered Nurse | Admitting: Registered Nurse

## 2020-06-08 DIAGNOSIS — M19011 Primary osteoarthritis, right shoulder: Secondary | ICD-10-CM | POA: Diagnosis not present

## 2020-06-25 ENCOUNTER — Other Ambulatory Visit: Payer: Self-pay | Admitting: Family Medicine

## 2020-06-25 DIAGNOSIS — I10 Essential (primary) hypertension: Secondary | ICD-10-CM

## 2020-07-02 ENCOUNTER — Encounter: Payer: Medicare Other | Attending: Physical Medicine & Rehabilitation | Admitting: Registered Nurse

## 2020-07-02 ENCOUNTER — Encounter: Payer: Self-pay | Admitting: Registered Nurse

## 2020-07-02 ENCOUNTER — Other Ambulatory Visit: Payer: Self-pay

## 2020-07-02 VITALS — BP 132/89 | HR 67 | Temp 98.6°F | Ht 64.0 in | Wt 215.8 lb

## 2020-07-02 DIAGNOSIS — M62838 Other muscle spasm: Secondary | ICD-10-CM | POA: Diagnosis not present

## 2020-07-02 DIAGNOSIS — M778 Other enthesopathies, not elsewhere classified: Secondary | ICD-10-CM | POA: Insufficient documentation

## 2020-07-02 DIAGNOSIS — Z79891 Long term (current) use of opiate analgesic: Secondary | ICD-10-CM | POA: Diagnosis not present

## 2020-07-02 DIAGNOSIS — G894 Chronic pain syndrome: Secondary | ICD-10-CM | POA: Diagnosis not present

## 2020-07-02 DIAGNOSIS — M961 Postlaminectomy syndrome, not elsewhere classified: Secondary | ICD-10-CM | POA: Insufficient documentation

## 2020-07-02 DIAGNOSIS — M4807 Spinal stenosis, lumbosacral region: Secondary | ICD-10-CM | POA: Diagnosis not present

## 2020-07-02 DIAGNOSIS — Z5181 Encounter for therapeutic drug level monitoring: Secondary | ICD-10-CM | POA: Insufficient documentation

## 2020-07-02 MED ORDER — MORPHINE SULFATE ER 15 MG PO TBCR: 15.0000 mg | EXTENDED_RELEASE_TABLET | Freq: Three times a day (TID) | ORAL | 0 refills | Status: DC

## 2020-07-02 MED ORDER — MORPHINE SULFATE 15 MG PO TABS: 15.0000 mg | ORAL_TABLET | Freq: Two times a day (BID) | ORAL | 0 refills | Status: DC | PRN

## 2020-07-02 NOTE — Progress Notes (Signed)
Subjective:    Patient ID: [name] [name], female    DOB: 22-Feb-1956, 65 y.o.   MRN: [id number]  HPI: [name] [name] is a 65 y.o. female who returns for follow up appointment for chronic pain and medication refill. She states her pain is located in her right shoulder, also states her right shoulder pain has increased in intensity requesting an injection. [name] [name] will be scheduled with Dr Letta Pate for right shoulder injection. Also reports  lower back pain.She rates her pain 3. Her current exercise regime is walking and performing stretching exercises.  [name] [name] Morphine equivalent is 75.00 MME.  Oral Swab was Performed today.    Pain Inventory Average Pain 3 Pain Right Now 3 My pain is intermittent, sharp and aching  In the last 24 hours, has pain interfered with the following? General activity 4 Relation with others 3 Enjoyment of life 4 What TIME of day is your pain at its worst? morning , daytime and night Sleep (in general) Fair  Pain is worse with: walking and some activites Pain improves with: rest, heat/ice and medication Relief from Meds: 2  Family History  Problem Relation Age of Onset  . Heart disease Mother 47       CHF; CAD  . Hypertension Mother   . Heart failure Mother   . Diabetes Sister   . Lupus Sister   . Kidney failure Sister   . Hypertension Brother   . Hyperlipidemia Sister   . Hypertension Sister   . Colon cancer Neg Hx   . Rectal cancer Neg Hx    Social History   Socioeconomic History  . Marital status: Widowed    Spouse name: Not on file  . Number of children: 3  . Years of education: Not on file  . Highest education level: Not on file  Occupational History  . 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 Smoking status: Former Smoker    Packs/day: 0.50    Years: 20.00    Pack years: 10.00    Types: Cigarettes    Quit date: 07/21/2008    Years since quitting: 11.9  . Smokeless tobacco: Never Used   Vaping Use  . Vaping Use: Never used  Substance and Sexual Activity  . Alcohol use: No  . Drug use: No  . Sexual activity: Not on file  Other Topics Concern  . Not on file  Social History Narrative   Marital status: widowed since 2002; not dating which is bad in 2018      Children: 3 children (45, 79, 7); 13 grandchildren; 1 gg      Employment: disability since 2009; retail      Lives: alone in [place] in apartment      Tobacco: quit in 2011      Alcohol: none      Drugs: none      Exercise: minimal in 2018      ADLs: independent with ADLs; drives      Seatbelt: 100%; no texting.   Social Determinants of Health   Financial Resource Strain: Not on file  Food Insecurity: Not on file  Transportation Needs: Not on file  Physical Activity: Not on file  Stress: Not on file  Social Connections: Not on file   Past Surgical History:  Procedure Laterality Date  . ABDOMINAL HYSTERECTOMY  1982   DUB; ovaries intact  . bladder surg  1992   bladder retraction  .  BREAST BIOPSY    . BREAST REDUCTION SURGERY  1984  . CARDIAC CATHETERIZATION  1990's   [name]  . COLON SURGERY  05/11/11   [facility] of adhesions Crohn's  . MASS EXCISION Left 01/03/2018   Procedure: EXCISION LEFT UPPER BACK LIPOMA ERAS PATH;  Surgeon: Erroll Luna, MD;  Location: [facility];  Service: General;  Laterality: Left;  . REDUCTION MAMMAPLASTY    . SPINE SURGERY     5 total (1 upper, 4 lumbar)   Past Surgical History:  Procedure Laterality Date  . ABDOMINAL HYSTERECTOMY  1982   DUB; ovaries intact  . bladder surg  1992   bladder retraction  . BREAST BIOPSY    . BREAST REDUCTION SURGERY  1984  . CARDIAC CATHETERIZATION  1990's   [name]  . COLON SURGERY  05/11/11   [facility] of adhesions Crohn's  . MASS EXCISION Left 01/03/2018   Procedure: EXCISION LEFT UPPER BACK LIPOMA ERAS PATH;  Surgeon: Erroll Luna, MD;  Location: [facility];  Service: General;   Laterality: Left;  . REDUCTION MAMMAPLASTY    . SPINE SURGERY     5 total (1 upper, 4 lumbar)   Past Medical History:  Diagnosis Date  . Anemia   . Blood transfusion without reported diagnosis   . Crohn's disease ([place])   . Disorder of sacroiliac joint   . Hyperlipidemia   . Hypertension   . Ischemic colitis ([place])   . Lumbago   . Lumbar post-laminectomy syndrome   . Lumbosacral neuritis   . Lumbosacral radiculitis   . Sciatica    BP 132/89   Pulse 67   Temp 98.6 F (37 C)   Ht 5\' 4"  (1.626 m)   Wt 215 lb 12.8 oz (97.9 kg)   SpO2 98%   BMI 37.04 kg/m   Opioid Risk Score:   Fall Risk Score:  `1  Depression screen PHQ 2/9  Depression screen [facility] 2/9 07/02/2020 03/19/2020 01/09/2020 12/11/2019 11/07/2019 11/01/2019 10/14/2019  Decreased Interest 0 0 0 0 3 3 0  Down, Depressed, Hopeless 0 0 0 0 1 1 0  PHQ - 2 Score 0 0 0 0 4 4 0  Altered sleeping - - - 0 3 3 -  Tired, decreased energy - - - 0 3 3 -  Change in appetite - - - 0 3 3 -  Feeling bad or failure about yourself  - - - 0 2 1 -  Trouble concentrating - - - 0 0 0 -  Moving slowly or fidgety/restless - - - 0 0 0 -  Suicidal thoughts - - - 0 0 - -  PHQ-9 Score - - - 0 15 14 -  Difficult doing work/chores - - - - Extremely dIfficult Extremely dIfficult -  Some recent data might be hidden   Review of Systems  Constitutional: Negative.   HENT: Negative.   Eyes: Negative.   Respiratory: Negative.   Cardiovascular: Negative.   Gastrointestinal: Negative.   Endocrine: Negative.   Genitourinary: Negative.   Musculoskeletal: Positive for back pain.       Right shoulder  Skin: Negative.   Allergic/Immunologic: Negative.   Neurological: Negative.   Hematological: Negative.   Psychiatric/Behavioral: Negative.   All other systems reviewed and are negative.      Objective:   Physical Exam Vitals and nursing note reviewed.  Constitutional:      Appearance: Normal appearance.  Cardiovascular:     Rate  and Rhythm:  Normal rate and regular rhythm.     Pulses: Normal pulses.     Heart sounds: Normal heart sounds.  Pulmonary:     Effort: Pulmonary effort is normal.     Breath sounds: Normal breath sounds.  Musculoskeletal:     Cervical back: Normal range of motion and neck supple.     Comments: Normal Muscle Bulk and Muscle Testing Reveals:  Upper Extremities: Right Upper Extremity: Decreased ROM and Muscle Strength  4/5 Right AC Joint Tenderness Left Upper Extremity: Decreased ROM 45 Degrees and Muscle Strength 5/5 Lumbar Hypersensitivity Lower Extremities: Full ROM and Muscle Strength 5/5 Arises from Table slowly Antalgic  Gait   Skin:    General: Skin is warm and dry.  Neurological:     Mental Status: She is alert and oriented to person, place, and time.  Psychiatric:        Mood and Affect: Mood normal.        Behavior: Behavior normal.           Assessment & Plan:  1.Lumbar Postlaminectomy/ Spinal Stenosis Lumbar Region/ Post-Op Pain/Low back pain with radiating symptoms in L3-4 distribution: continues to be limited by pain. Refilled:MSIR 15 mg one tablet twice a day as needed for moderate to sever pain#60andMorphine Sulfate ER15 mg Q 8 hours.Second script e-scribed to accommodate scheduled appointment.07/02/2020. We will continue the opioid monitoring program, this consists of regular clinic visits, examinations, urine drug screen, pill counts as well as use of [place] Controlled Substance Reporting system. A 12 month History has been reviewed on the [place] Controlled Substance Reporting Systemon01/13/2022.Encouraged to Continue exercise regime.  2. Lumbar Radiculopathy:No complaints today. ContinueTopamax:.Continue to Monitor01/13/2022 3.ChronicRight Knee Pain:No complaints today.Continue with Ice TherapyandVoltaren Gel.01/132022 4. Muscle Spasm: Continuecurrent treatment: Tizanidine.07/02/2020 5. Lipoma: S/P Left Lipoma Excision of upper back  By Dr. Brantley Stage on 01/03/2018. Surgery Following. Continue to Monitor.07/02/2020. 6. Paresthesia of Skin: ContinueTopamax.07/02/2020 7. Adhesive Capsulitis of Right Shoulder:Right Shoulder Pain: Right Shoulder Tendonitis: Schedule Right Shoulder Cortisone Injection.with Dr Letta Pate.   S/PU/S Cortisoneinjectionon 01/13/2020with some relief noted.Continue to monitor. Continue HEP as Tolerated.07/02/2020  F/U in 1 month

## 2020-07-07 LAB — DRUG TOX MONITOR 1 W/CONF, ORAL FLD

## 2020-07-07 LAB — DRUG TOX ALC METAB W/CON, ORAL FLD: Alcohol Metabolite: NEGATIVE ng/mL (ref <25)

## 2020-07-21 ENCOUNTER — Encounter: Payer: Self-pay | Admitting: Physical Medicine & Rehabilitation

## 2020-07-21 ENCOUNTER — Encounter: Payer: Medicare Other | Attending: Physical Medicine & Rehabilitation | Admitting: Physical Medicine & Rehabilitation

## 2020-07-21 ENCOUNTER — Other Ambulatory Visit: Payer: Self-pay

## 2020-07-21 VITALS — BP 123/82 | HR 79 | Temp 99.0°F | Ht 64.0 in | Wt 215.0 lb

## 2020-07-21 DIAGNOSIS — Z5181 Encounter for therapeutic drug level monitoring: Secondary | ICD-10-CM | POA: Diagnosis not present

## 2020-07-21 DIAGNOSIS — Z79891 Long term (current) use of opiate analgesic: Secondary | ICD-10-CM | POA: Insufficient documentation

## 2020-07-21 DIAGNOSIS — M4807 Spinal stenosis, lumbosacral region: Secondary | ICD-10-CM | POA: Insufficient documentation

## 2020-07-21 DIAGNOSIS — G894 Chronic pain syndrome: Secondary | ICD-10-CM | POA: Diagnosis not present

## 2020-07-21 DIAGNOSIS — M961 Postlaminectomy syndrome, not elsewhere classified: Secondary | ICD-10-CM | POA: Diagnosis not present

## 2020-07-21 DIAGNOSIS — M7501 Adhesive capsulitis of right shoulder: Secondary | ICD-10-CM | POA: Insufficient documentation

## 2020-07-21 NOTE — Patient Instructions (Signed)
Shoulder Impingement Syndrome Rehab Ask your health care provider which exercises are safe for you. Do exercises exactly as told by your health care provider and adjust them as directed. It is normal to feel mild stretching, pulling, tightness, or discomfort as you do these exercises. Stop right away if you feel sudden pain or your pain gets worse. Do not begin these exercises until told by your health care provider. Stretching and range-of-motion exercise This exercise warms up your muscles and joints and improves the movement and flexibility of your shoulder. This exercise also helps to relieve pain and stiffness. Passive horizontal adduction In passive adduction, you use your other hand to move the injured arm toward your body. The injured arm does not move on its own. In this movement, your arm is moved across your body in the horizontal plane (horizontal adduction). 1. Sit or stand and pull your left / right elbow across your chest, toward your other shoulder. Stop when you feel a gentle stretch in the back of your shoulder and upper arm. ? Keep your arm at shoulder height. ? Keep your arm as close to your body as you comfortably can. 2. Hold for __________ seconds. 3. Slowly return to the starting position. Repeat __________ times. Complete this exercise __________ times a day.   Strengthening exercises These exercises build strength and endurance in your shoulder. Endurance is the ability to use your muscles for a long time, even after they get tired. External rotation, isometric This is an exercise in which you press the back of your wrist against a door frame without moving your shoulder joint (isometric). 1. Stand or sit in a doorway, facing the door frame. 2. Bend your left / right elbow and place the back of your wrist against the door frame. Only the back of your wrist should be touching the frame. Keep your upper arm at your side. 3. Gently press your wrist against the door frame, as  if you are trying to push your arm away from your abdomen (external rotation). Press as hard as you are able without pain. ? Avoid shrugging your shoulder while you press your wrist against the door frame. Keep your shoulder blade tucked down toward the middle of your back. 4. Hold for __________ seconds. 5. Slowly release the tension, and relax your muscles completely before you repeat the exercise. Repeat __________ times. Complete this exercise __________ times a day. Internal rotation, isometric This is an exercise in which you press your palm against a door frame without moving your shoulder joint (isometric). 1. Stand or sit in a doorway, facing the door frame. 2. Bend your left / right elbow and place the palm of your hand against the door frame. Only your palm should be touching the frame. Keep your upper arm at your side. 3. Gently press your hand against the door frame, as if you are trying to push your arm toward your abdomen (internal rotation). Press as hard as you are able without pain. ? Avoid shrugging your shoulder while you press your hand against the door frame. Keep your shoulder blade tucked down toward the middle of your back. 4. Hold for __________ seconds. 5. Slowly release the tension, and relax your muscles completely before you repeat the exercise. Repeat __________ times. Complete this exercise __________ times a day.   Scapular protraction, supine 1. Lie on your back on a firm surface (supine position). Hold a __________ weight in your left / right hand. 2. Raise your left /  right arm straight into the air so your hand is directly above your shoulder joint. 3. Push the weight into the air so your shoulder (scapula) lifts off the surface that you are lying on. The scapula will push up or forward (protraction). Do not move your head, neck, or back. 4. Hold for __________ seconds. 5. Slowly return to the starting position. Let your muscles relax completely before you  repeat this exercise. Repeat __________ times. Complete this exercise __________ times a day.   Scapular retraction 1. Sit in a stable chair without armrests, or stand up. 2. Secure an exercise band to a stable object in front of you so the band is at shoulder height. 3. Hold one end of the exercise band in each hand. Your palms should face down. 4. Squeeze your shoulder blades together (retraction) and move your elbows slightly behind you. Do not shrug your shoulders upward while you do this. 5. Hold for __________ seconds. 6. Slowly return to the starting position. Repeat __________ times. Complete this exercise __________ times a day.   Shoulder extension 1. Sit in a stable chair without armrests, or stand up. 2. Secure an exercise band to a stable object in front of you so the band is above shoulder height. 3. Hold one end of the exercise band in each hand. 4. Straighten your elbows and lift your hands up to shoulder height. 5. Squeeze your shoulder blades together and pull your hands down to the sides of your thighs (extension). Stop when your hands are straight down by your sides. Do not let your hands go behind your body. 6. Hold for __________ seconds. 7. Slowly return to the starting position. Repeat __________ times. Complete this exercise __________ times a day.   This information is not intended to replace advice given to you by your health care provider. Make sure you discuss any questions you have with your health care provider. Document Revised: 09/28/2018 Document Reviewed: 07/02/2018 Elsevier Patient Education  2021 [employer].

## 2020-07-21 NOTE — Progress Notes (Signed)
Shoulder injection Right glenohumeral without ultrasound   Indication:Right Shoulder pain not relieved by medication management and other conservative care.  Informed consent was obtained after describing risks and benefits of the procedure with the patient, this includes bleeding, bruising, infection and medication side effects. The patient wishes to proceed and has given written consent. Patient was placed in a seated position. TheRight shoulder was marked and prepped with betadine in the subacromial area. A 25-gauge 1-1/2 inch needle was inserted into the subacromial area. After negative draw back for blood, a solution containing 1 mL of 6 mg per ML betamethasone and 4 mL of 1% lidocaine was injected. A band aid was applied. The patient tolerated the procedure well. Post procedure instructions were given.

## 2020-07-30 ENCOUNTER — Other Ambulatory Visit: Payer: Self-pay | Admitting: Family Medicine

## 2020-07-30 DIAGNOSIS — I10 Essential (primary) hypertension: Secondary | ICD-10-CM

## 2020-08-03 ENCOUNTER — Encounter (HOSPITAL_BASED_OUTPATIENT_CLINIC_OR_DEPARTMENT_OTHER): Payer: Medicare Other | Admitting: Registered Nurse

## 2020-08-03 ENCOUNTER — Other Ambulatory Visit: Payer: Self-pay

## 2020-08-03 ENCOUNTER — Encounter: Payer: Self-pay | Admitting: Registered Nurse

## 2020-08-03 VITALS — BP 134/68 | HR 60 | Temp 98.4°F | Ht 64.0 in | Wt 215.0 lb

## 2020-08-03 DIAGNOSIS — M961 Postlaminectomy syndrome, not elsewhere classified: Secondary | ICD-10-CM

## 2020-08-03 DIAGNOSIS — M7501 Adhesive capsulitis of right shoulder: Secondary | ICD-10-CM

## 2020-08-03 DIAGNOSIS — G894 Chronic pain syndrome: Secondary | ICD-10-CM | POA: Diagnosis not present

## 2020-08-03 DIAGNOSIS — Z5181 Encounter for therapeutic drug level monitoring: Secondary | ICD-10-CM | POA: Diagnosis not present

## 2020-08-03 DIAGNOSIS — M4807 Spinal stenosis, lumbosacral region: Secondary | ICD-10-CM | POA: Diagnosis not present

## 2020-08-03 DIAGNOSIS — Z79891 Long term (current) use of opiate analgesic: Secondary | ICD-10-CM

## 2020-08-03 MED ORDER — TOPIRAMATE 50 MG PO TABS: ORAL_TABLET | ORAL | 2 refills | Status: DC

## 2020-08-03 MED ORDER — MORPHINE SULFATE ER 15 MG PO TBCR: 15.0000 mg | EXTENDED_RELEASE_TABLET | Freq: Three times a day (TID) | ORAL | 0 refills | Status: DC

## 2020-08-03 MED ORDER — MORPHINE SULFATE 15 MG PO TABS: 15.0000 mg | ORAL_TABLET | Freq: Two times a day (BID) | ORAL | 0 refills | Status: DC | PRN

## 2020-08-03 NOTE — Progress Notes (Signed)
Subjective:    Patient ID: [name] [name], female    DOB: [birth year], 65 y.o.   MRN: [id number]  HPI: [name] [name] is a 65 y.o. female who returns for follow up appointment for chronic pain and medication refill. She states her pain is located in her right shoulder and lower back pain. She. rates her pain 3. Her current exercise regime is walking and performing stretching exercises.  [name] [name] Morphine equivalent is 75.00  MME.  Last Oral Swab was Performed on 07/02/2020, it was consistent.   Pain Inventory Average Pain 3 Pain Right Now 3 My pain is stabbing and aching  In the last 24 hours, has pain interfered with the following? General activity 3 Relation with others 3 Enjoyment of life 3 What TIME of day is your pain at its worst? morning , daytime and night Sleep (in general) Fair  Pain is worse with: walking, sitting, inactivity and standing Pain improves with: rest, heat/ice and medication Relief from Meds: 3  Family History  Problem Relation Age of Onset  . Heart disease Mother 55       CHF; CAD  . Hypertension Mother   . Heart failure Mother   . Diabetes Sister   . Lupus Sister   . Kidney failure Sister   . Hypertension Brother   . Hyperlipidemia Sister   . Hypertension Sister   . Colon cancer Neg Hx   . Rectal cancer Neg Hx    Social History   Socioeconomic History  . Marital status: Widowed    Spouse name: Not on file  . Number of children: 3  . Years of education: Not on file  . Highest education level: Not on file  Occupational History  . 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 Smoking status: Former Smoker    Packs/day: 0.50    Years: 20.00    Pack years: 10.00    Types: Cigarettes    Quit date: 07/21/2008    Years since quitting: 12.0  . Smokeless tobacco: Never Used  Vaping Use  . Vaping Use: Never used  Substance and Sexual Activity  . Alcohol use: No  . Drug use: No  . Sexual activity: Not on  file  Other Topics Concern  . Not on file  Social History Narrative   Marital status: widowed since 2002; not dating which is bad in 2018      Children: 3 children (45, 88, 54); 13 grandchildren; 1 gg      Employment: disability since 2009; retail      Lives: alone in [place] in apartment      Tobacco: quit in 2011      Alcohol: none      Drugs: none      Exercise: minimal in 2018      ADLs: independent with ADLs; drives      Seatbelt: 100%; no texting.   Social Determinants of Health   Financial Resource Strain: Not on file  Food Insecurity: Not on file  Transportation Needs: Not on file  Physical Activity: Not on file  Stress: Not on file  Social Connections: Not on file   Past Surgical History:  Procedure Laterality Date  . ABDOMINAL HYSTERECTOMY  1982   DUB; ovaries intact  . bladder surg  1992   bladder retraction  . BREAST BIOPSY    . BREAST REDUCTION SURGERY  1984  . CARDIAC CATHETERIZATION  1990's   [facility]  [place]  . COLON SURGERY  05/11/11   [facility] L[facility] of adhesions Crohn's  . MASS EXCISION Left 01/03/2018   Procedure: EXCISION LEFT UPPER BACK LIPOMA ERAS PATH;  Surgeon: Erroll Luna, MD;  Location: [facility];  Service: General;  Laterality: Left;  . REDUCTION MAMMAPLASTY    . SPINE SURGERY     5 total (1 upper, 4 lumbar)   Past Surgical History:  Procedure Laterality Date  . ABDOMINAL HYSTERECTOMY  1982   DUB; ovaries intact  . bladder surg  1992   bladder retraction  . BREAST BIOPSY    . BREAST REDUCTION SURGERY  1984  . CARDIAC CATHETERIZATION  1990's   [name]  . COLON SURGERY  05/11/11   [facility] Lysis of adhesions Crohn's  . MASS EXCISION Left 01/03/2018   Procedure: EXCISION LEFT UPPER BACK LIPOMA ERAS PATH;  Surgeon: Erroll Luna, MD;  Location: [place] [facility];  Service: General;  Laterality: Left;  . REDUCTION MAMMAPLASTY    . SPINE SURGERY     5 total (1 upper, 4 lumbar)   Past Medical History:  Diagnosis  Date  . Anemia   . Blood transfusion without reported diagnosis   . Crohn's disease ([place])   . Disorder of sacroiliac joint   . Hyperlipidemia   . Hypertension   . Ischemic colitis ([place])   . Lumbago   . Lumbar post-laminectomy syndrome   . Lumbosacral neuritis   . Lumbosacral radiculitis   . Sciatica    BP 134/68   Pulse 60   Temp 98.4 F (36.9 C)   Ht 5\' 4"  (1.626 m)   Wt 215 lb (97.5 kg)   SpO2 96%   BMI 36.90 kg/m   Opioid Risk Score:   Fall Risk Score:  `1  Depression screen PHQ 2/9  Depression screen [facility] 2/9 08/03/2020 07/02/2020 03/19/2020 01/09/2020 12/11/2019 11/07/2019 11/01/2019  Decreased Interest 0 0 0 0 0 3 3  Down, Depressed, Hopeless 0 0 0 0 0 1 1  PHQ - 2 Score 0 0 0 0 0 4 4  Altered sleeping - - - - 0 3 3  Tired, decreased energy - - - - 0 3 3  Change in appetite - - - - 0 3 3  Feeling bad or failure about yourself  - - - - 0 2 1  Trouble concentrating - - - - 0 0 0  Moving slowly or fidgety/restless - - - - 0 0 0  Suicidal thoughts - - - - 0 0 -  PHQ-9 Score - - - - 0 15 14  Difficult doing work/chores - - - - - Extremely dIfficult Extremely dIfficult  Some recent data might be hidden    Review of Systems  Constitutional: Negative.   HENT: Negative.   Eyes: Negative.   Respiratory: Negative.   Cardiovascular: Negative.   Gastrointestinal: Negative.   Endocrine: Negative.   Genitourinary: Negative.   Musculoskeletal: Positive for back pain.  Skin: Negative.   Allergic/Immunologic: Negative.   Neurological: Negative.   Hematological: Negative.   Psychiatric/Behavioral: Negative.   All other systems reviewed and are negative.      Objective:   Physical Exam Vitals and nursing note reviewed.  Constitutional:      Appearance: Normal appearance.  Cardiovascular:     Rate and Rhythm: Normal rate and regular rhythm.     Pulses: Normal pulses.     Heart sounds: Normal heart sounds.  Pulmonary:  Effort: Pulmonary effort is normal.      Breath sounds: Normal breath sounds.  Musculoskeletal:     Cervical back: Normal range of motion and neck supple.     Comments: Normal Muscle Bulk and Muscle Testing Reveals:  Upper Extremities: Right Upper Extremity: Decreased ROM 90 Degrees and Muscle Strength  5/5 Left Upper Extremity: Full ROM and Muscle Strength 5/5  Lumbar Paraspinal Tenderness: L-4-L-5 Mainly Left Side Lower Extremities: Full ROM and Muscle Strength 5/5 Arises from Table with ease Narrow Based Gait   Skin:    General: Skin is warm and dry.  Neurological:     Mental Status: She is alert and oriented to person, place, and time.  Psychiatric:        Mood and Affect: Mood normal.        Behavior: Behavior normal.           Assessment & Plan:  1.Lumbar Postlaminectomy/ Spinal Stenosis Lumbar Region/ Post-Op Pain/Low back pain with radiating symptoms in L3-4 distribution: continues to be limited by pain. Refilled:MSIR 15 mg one tablet twice a day as needed for moderate to sever pain#60andMorphine Sulfate ER15 mg Q 8 hours.Second script e-scribed to accommodate scheduled appointment.08/03/2020. We will continue the opioid monitoring program, this consists of regular clinic visits, examinations, urine drug screen, pill counts as well as use of [place] Controlled Substance Reporting system. A 12 month History has been reviewed on the [place] Controlled Substance Reporting Systemon02/14/2022.Encouraged to Continue exercise regime. 2. Lumbar Radiculopathy:No complaints today.ContinueTopamax:.Continue to Monitor02/14/2022 3.ChronicRight Knee Pain:No complaints today.Continue with Ice TherapyandVoltaren Gel.02/142022 4. Muscle Spasm: Continuecurrent treatment: Tizanidine.08/03/2020 5. Lipoma: S/P Left Lipoma Excision of upper back By Dr. Brantley Stage on 01/03/2018. Surgery Following. Continue to Monitor.08/03/2020. 6. Paresthesia of Skin: ContinueTopamax. Continue to  monitor.08/03/2020 7. Adhesive Capsulitis of Right Shoulder:Right Shoulder Pain: S/P Right Shoulder Cortisone Injection.with Dr Letta Pate with good relief noted. Continue to monitor. Continue HEP as Tolerated.08/03/2020  F/U in 1 month

## 2020-08-25 ENCOUNTER — Other Ambulatory Visit: Payer: Self-pay | Admitting: Family Medicine

## 2020-08-25 DIAGNOSIS — R600 Localized edema: Secondary | ICD-10-CM

## 2020-08-29 ENCOUNTER — Other Ambulatory Visit: Payer: Self-pay | Admitting: Registered Nurse

## 2020-08-31 ENCOUNTER — Encounter: Payer: Medicare Other | Attending: Physical Medicine & Rehabilitation | Admitting: Registered Nurse

## 2020-08-31 ENCOUNTER — Other Ambulatory Visit: Payer: Self-pay

## 2020-08-31 ENCOUNTER — Encounter: Payer: Self-pay | Admitting: Registered Nurse

## 2020-08-31 VITALS — BP 137/61 | HR 60 | Temp 98.5°F | Ht 64.0 in | Wt 214.8 lb

## 2020-08-31 DIAGNOSIS — Z5181 Encounter for therapeutic drug level monitoring: Secondary | ICD-10-CM | POA: Diagnosis not present

## 2020-08-31 DIAGNOSIS — M4807 Spinal stenosis, lumbosacral region: Secondary | ICD-10-CM | POA: Diagnosis not present

## 2020-08-31 DIAGNOSIS — M62838 Other muscle spasm: Secondary | ICD-10-CM | POA: Diagnosis not present

## 2020-08-31 DIAGNOSIS — M7501 Adhesive capsulitis of right shoulder: Secondary | ICD-10-CM | POA: Diagnosis not present

## 2020-08-31 DIAGNOSIS — G894 Chronic pain syndrome: Secondary | ICD-10-CM | POA: Diagnosis not present

## 2020-08-31 DIAGNOSIS — Z79891 Long term (current) use of opiate analgesic: Secondary | ICD-10-CM | POA: Diagnosis not present

## 2020-08-31 DIAGNOSIS — M961 Postlaminectomy syndrome, not elsewhere classified: Secondary | ICD-10-CM | POA: Insufficient documentation

## 2020-08-31 MED ORDER — MORPHINE SULFATE ER 15 MG PO TBCR: 15.0000 mg | EXTENDED_RELEASE_TABLET | Freq: Three times a day (TID) | ORAL | 0 refills | Status: DC

## 2020-08-31 MED ORDER — MORPHINE SULFATE 15 MG PO TABS: 15.0000 mg | ORAL_TABLET | Freq: Two times a day (BID) | ORAL | 0 refills | Status: DC | PRN

## 2020-08-31 NOTE — Progress Notes (Signed)
Subjective:    Patient ID: [name] [name], female    DOB: 30-Aug-1955, 65 y.o.   MRN: [id number]  HPI: [name] [name] is a 65 y.o. female who returns for follow up appointment for chronic pain and medication refill. She states her pain is located in her lower back. She rates her pain 3. Her current exercise regime is walking and performing stretching exercises.  [name] [name] Morphine equivalent is 75.00 MME.    Last Oral Swab was Performed on 07/02/2020, it was consistent.   Pain Inventory Average Pain 3 Pain Right Now 3 My pain is constant, stabbing and aching  In the last 24 hours, has pain interfered with the following? General activity 3 Relation with others 3 Enjoyment of life 3 What TIME of day is your pain at its worst? morning , daytime and night Sleep (in general) Fair  Pain is worse with: walking, sitting and inactivity Pain improves with: rest, heat/ice and medication Relief from Meds: 2  Family History  Problem Relation Age of Onset  . Heart disease Mother 57       CHF; CAD  . Hypertension Mother   . Heart failure Mother   . Diabetes Sister   . Lupus Sister   . Kidney failure Sister   . Hypertension Brother   . Hyperlipidemia Sister   . Hypertension Sister   . Colon cancer Neg Hx   . Rectal cancer Neg Hx    Social History   Socioeconomic History  . Marital status: Widowed    Spouse name: Not on file  . Number of children: 3  . Years of education: Not on file  . Highest education level: Not on file  Occupational History  . 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 Smoking status: Former Smoker    Packs/day: 0.50    Years: 20.00    Pack years: 10.00    Types: Cigarettes    Quit date: 07/21/2008    Years since quitting: 12.1  . Smokeless tobacco: Never Used  Vaping Use  . Vaping Use: Never used  Substance and Sexual Activity  . Alcohol use: No  . Drug use: No  . Sexual activity: Not on file  Other Topics  Concern  . Not on file  Social History Narrative   Marital status: widowed since 2002; not dating which is bad in 2018      Children: 3 children (45, 50, 60); 13 grandchildren; 1 gg      Employment: disability since 2009; retail      Lives: alone in [place] in apartment      Tobacco: quit in 2011      Alcohol: none      Drugs: none      Exercise: minimal in 2018      ADLs: independent with ADLs; drives      Seatbelt: 100%; no texting.   Social Determinants of Health   Financial Resource Strain: Not on file  Food Insecurity: Not on file  Transportation Needs: Not on file  Physical Activity: Not on file  Stress: Not on file  Social Connections: Not on file   Past Surgical History:  Procedure Laterality Date  . ABDOMINAL HYSTERECTOMY  1982   DUB; ovaries intact  . bladder surg  1992   bladder retraction  . BREAST BIOPSY    . BREAST REDUCTION SURGERY  1984  . CARDIAC CATHETERIZATION  1990's   [name]  .  COLON SURGERY  05/11/11   [facility] of adhesions Crohn's  . MASS EXCISION Left 01/03/2018   Procedure: EXCISION LEFT UPPER BACK LIPOMA ERAS PATH;  Surgeon: Erroll Luna, MD;  Location: [facility];  Service: General;  Laterality: Left;  . REDUCTION MAMMAPLASTY    . SPINE SURGERY     5 total (1 upper, 4 lumbar)   Past Surgical History:  Procedure Laterality Date  . ABDOMINAL HYSTERECTOMY  1982   DUB; ovaries intact  . bladder surg  1992   bladder retraction  . BREAST BIOPSY    . BREAST REDUCTION SURGERY  1984  . CARDIAC CATHETERIZATION  1990's   [name]  . COLON SURGERY  05/11/11   [facility] of adhesions Crohn's  . MASS EXCISION Left 01/03/2018   Procedure: EXCISION LEFT UPPER BACK LIPOMA ERAS PATH;  Surgeon: Erroll Luna, MD;  Location: [facility];  Service: General;  Laterality: Left;  . REDUCTION MAMMAPLASTY    . SPINE SURGERY     5 total (1 upper, 4 lumbar)   Past Medical History:  Diagnosis Date  . Anemia   .  Blood transfusion without reported diagnosis   . Crohn's disease ([place])   . Disorder of sacroiliac joint   . Hyperlipidemia   . Hypertension   . Ischemic colitis ([place])   . Lumbago   . Lumbar post-laminectomy syndrome   . Lumbosacral neuritis   . Lumbosacral radiculitis   . Sciatica    BP 137/61   Pulse (!) 56   Temp 98.5 F (36.9 C)   Ht 5\' 4"  (1.626 m)   Wt 214 lb 12.8 oz (97.4 kg)   SpO2 96%   BMI 36.87 kg/m   Opioid Risk Score:   Fall Risk Score:  `1  Depression screen PHQ 2/9  Depression screen [facility] 2/9 08/03/2020 07/02/2020 03/19/2020 01/09/2020 12/11/2019 11/07/2019 11/01/2019  Decreased Interest 0 0 0 0 0 3 3  Down, Depressed, Hopeless 0 0 0 0 0 1 1  PHQ - 2 Score 0 0 0 0 0 4 4  Altered sleeping - - - - 0 3 3  Tired, decreased energy - - - - 0 3 3  Change in appetite - - - - 0 3 3  Feeling bad or failure about yourself  - - - - 0 2 1  Trouble concentrating - - - - 0 0 0  Moving slowly or fidgety/restless - - - - 0 0 0  Suicidal thoughts - - - - 0 0 -  PHQ-9 Score - - - - 0 15 14  Difficult doing work/chores - - - - - Extremely dIfficult Extremely dIfficult  Some recent data might be hidden    Review of Systems  Constitutional: Negative.   HENT: Negative.   Eyes: Negative.   Respiratory: Negative.   Gastrointestinal: Negative.   Endocrine: Negative.   Genitourinary: Negative.   Musculoskeletal: Positive for back pain.       Right shoulder pain  Skin: Negative.   Allergic/Immunologic: Negative.   Neurological: Negative.   Hematological: Negative.   Psychiatric/Behavioral: Negative.   All other systems reviewed and are negative.      Objective:   Physical Exam Vitals and nursing note reviewed.  Constitutional:      Appearance: Normal appearance.  Cardiovascular:     Rate and Rhythm: Normal rate and regular rhythm.     Pulses: Normal pulses.     Heart sounds: Normal heart sounds.  Pulmonary:     Effort: Pulmonary effort is normal.     Breath sounds:  Normal breath sounds.  Musculoskeletal:     Cervical back: Normal range of motion and neck supple.     Comments: Normal Muscle Bulk and Muscle Testing Reveals:  Upper Extremities: Full ROM and Muscle Strength 5/5 Lumbar Paraspinal Tenderness: L-4-L-5 Lower Extremities: Full ROM and Muscle Strength 5/5 Arises from Table slowly Narrow Based  Gait   Skin:    General: Skin is warm and dry.  Neurological:     Mental Status: She is alert and oriented to person, place, and time.  Psychiatric:        Mood and Affect: Mood normal.        Behavior: Behavior normal.           Assessment & Plan:  1.Lumbar Postlaminectomy/ Spinal Stenosis Lumbar Region/ Post-Op Pain/Low back pain with radiating symptoms in L3-4 distribution: continues to be limited by pain. Refilled:MSIR 15 mg one tablet twice a day as needed for moderate to sever pain#60andMorphine Sulfate ER15 mg Q 8 hours.Second script e-scribed to accommodate scheduled appointment.08/31/2020. We will continue the opioid monitoring program, this consists of regular clinic visits, examinations, urine drug screen, pill counts as well as use of [place] Controlled Substance Reporting system. A 12 month History has been reviewed on the [place] Controlled Substance Reporting Systemon03/14/2022.Encouraged to Continue exercise regime. 2. Lumbar Radiculopathy:No complaints today.ContinueTopamax:.Continue to Monitor03/14/2022 3.ChronicRight Knee Pain:No complaints today.Continue with Ice TherapyandVoltaren Gel.03/142022 4. Muscle Spasm: Continuecurrent treatment: Tizanidine.08/31/2020 5. Lipoma: S/P Left Lipoma Excision of upper back By Dr. Brantley Stage on 01/03/2018. Surgery Following. Continue to Monitor.08/31/2020. 6. Paresthesia of Skin: ContinueTopamax. Continue to monitor.08/31/2020 7. Adhesive Capsulitis of Right Shoulder:Right Shoulder Pain:S/P Right ShoulderCortisoneInjection.with Dr Letta Pate with  good relief noted. Continue to monitor. Continue HEP as Tolerated.08/31/2020  F/U in 1 month

## 2020-09-22 ENCOUNTER — Other Ambulatory Visit: Payer: Self-pay | Admitting: Family Medicine

## 2020-09-22 DIAGNOSIS — R600 Localized edema: Secondary | ICD-10-CM

## 2020-09-28 ENCOUNTER — Other Ambulatory Visit: Payer: Self-pay | Admitting: Family Medicine

## 2020-09-28 DIAGNOSIS — I10 Essential (primary) hypertension: Secondary | ICD-10-CM

## 2020-09-30 ENCOUNTER — Encounter: Payer: Self-pay | Admitting: Registered Nurse

## 2020-09-30 ENCOUNTER — Encounter: Payer: Medicare Other | Attending: Physical Medicine & Rehabilitation | Admitting: Registered Nurse

## 2020-09-30 ENCOUNTER — Other Ambulatory Visit: Payer: Self-pay

## 2020-09-30 ENCOUNTER — Other Ambulatory Visit: Payer: Self-pay | Admitting: Registered Nurse

## 2020-09-30 VITALS — BP 130/62 | HR 66 | Temp 99.1°F | Ht 64.0 in | Wt 214.6 lb

## 2020-09-30 DIAGNOSIS — Z79891 Long term (current) use of opiate analgesic: Secondary | ICD-10-CM | POA: Diagnosis not present

## 2020-09-30 DIAGNOSIS — G894 Chronic pain syndrome: Secondary | ICD-10-CM

## 2020-09-30 DIAGNOSIS — M62838 Other muscle spasm: Secondary | ICD-10-CM | POA: Diagnosis not present

## 2020-09-30 DIAGNOSIS — Z5181 Encounter for therapeutic drug level monitoring: Secondary | ICD-10-CM | POA: Diagnosis not present

## 2020-09-30 DIAGNOSIS — M2550 Pain in unspecified joint: Secondary | ICD-10-CM

## 2020-09-30 DIAGNOSIS — M7501 Adhesive capsulitis of right shoulder: Secondary | ICD-10-CM

## 2020-09-30 DIAGNOSIS — M4807 Spinal stenosis, lumbosacral region: Secondary | ICD-10-CM

## 2020-09-30 DIAGNOSIS — M961 Postlaminectomy syndrome, not elsewhere classified: Secondary | ICD-10-CM

## 2020-09-30 MED ORDER — MORPHINE SULFATE ER 15 MG PO TBCR: 15.0000 mg | EXTENDED_RELEASE_TABLET | Freq: Three times a day (TID) | ORAL | 0 refills | Status: DC

## 2020-09-30 MED ORDER — MORPHINE SULFATE 15 MG PO TABS: 15.0000 mg | ORAL_TABLET | Freq: Two times a day (BID) | ORAL | 0 refills | Status: DC | PRN

## 2020-09-30 NOTE — Progress Notes (Signed)
Subjective:    Patient ID: [name] [name], female    DOB: 11-Oct-1955, 65 y.o.   MRN: [id number]  HPI: [name] [name] is a 65 y.o. female who returns for follow up appointment for chronic pain and medication refill.She states her pain is located in her right shoulder, lower back and also reports generalized joint pain. She rates her pain 3. Her  current exercise regime is walking and performing stretching exercises.  Ms. Assad Morphine equivalent is  75.00 MME.  UDS ordered today.  Pain Inventory Average Pain 3 Pain Right Now 3 My pain is intermittent, sharp and stabbing  In the last 24 hours, has pain interfered with the following? General activity 3 Relation with others 3 Enjoyment of life 3 What TIME of day is your pain at its worst? morning , daytime and night Sleep (in general) Fair  Pain is worse with: walking, sitting, standing and some activites Pain improves with: rest, heat/ice, medication and spinal stimulator Relief from Meds: 2  Family History  Problem Relation Age of Onset  . Heart disease Mother 29       CHF; CAD  . Hypertension Mother   . Heart failure Mother   . Diabetes Sister   . Lupus Sister   . Kidney failure Sister   . Hypertension Brother   . Hyperlipidemia Sister   . Hypertension Sister   . Colon cancer Neg Hx   . Rectal cancer Neg Hx    Social History   Socioeconomic History  . Marital status: Widowed    Spouse name: Not on file  . Number of children: 3  . Years of education: Not on file  . Highest education level: Not on file  Occupational History  . 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 Smoking status: Former Smoker    Packs/day: 0.50    Years: 20.00    Pack years: 10.00    Types: Cigarettes    Quit date: 07/21/2008    Years since quitting: 12.2  . Smokeless tobacco: Never Used  Vaping Use  . Vaping Use: Never used  Substance and Sexual Activity  . Alcohol use: No  . Drug use: No  .  Sexual activity: Not on file  Other Topics Concern  . Not on file  Social History Narrative   Marital status: widowed since 2002; not dating which is bad in 2018      Children: 3 children (45, 62, 66); 13 grandchildren; 1 gg      Employment: disability since 2009; retail      Lives: alone in [place] in apartment      Tobacco: quit in 2011      Alcohol: none      Drugs: none      Exercise: minimal in 2018      ADLs: independent with ADLs; drives      Seatbelt: 100%; no texting.   Social Determinants of Health   Financial Resource Strain: Not on file  Food Insecurity: Not on file  Transportation Needs: Not on file  Physical Activity: Not on file  Stress: Not on file  Social Connections: Not on file   Past Surgical History:  Procedure Laterality Date  . ABDOMINAL HYSTERECTOMY  1982   DUB; ovaries intact  . bladder surg  1992   bladder retraction  . BREAST BIOPSY    . BREAST REDUCTION SURGERY  1984  . CARDIAC CATHETERIZATION  1990's   [facility]  [facility]  . COLON SURGERY  05/11/11   [facility] of adhesions Crohn's  . MASS EXCISION Left 01/03/2018   Procedure: EXCISION LEFT UPPER BACK LIPOMA ERAS PATH;  Surgeon: Erroll Luna, MD;  Location: [facility];  Service: General;  Laterality: Left;  . REDUCTION MAMMAPLASTY    . SPINE SURGERY     5 total (1 upper, 4 lumbar)   Past Surgical History:  Procedure Laterality Date  . ABDOMINAL HYSTERECTOMY  1982   DUB; ovaries intact  . bladder surg  1992   bladder retraction  . BREAST BIOPSY    . BREAST REDUCTION SURGERY  1984  . CARDIAC CATHETERIZATION  1990's   [name]  . COLON SURGERY  05/11/11   [facility] L[facility] of adhesions Crohn's  . MASS EXCISION Left 01/03/2018   Procedure: EXCISION LEFT UPPER BACK LIPOMA ERAS PATH;  Surgeon: Erroll Luna, MD;  Location: [facility];  Service: General;  Laterality: Left;  . REDUCTION MAMMAPLASTY    . SPINE SURGERY     5 total (1 upper, 4 lumbar)   Past Medical  History:  Diagnosis Date  . Anemia   . Blood transfusion without reported diagnosis   . Crohn's disease ([place])   . Disorder of sacroiliac joint   . Hyperlipidemia   . Hypertension   . Ischemic colitis ([place])   . Lumbago   . Lumbar post-laminectomy syndrome   . Lumbosacral neuritis   . Lumbosacral radiculitis   . Sciatica    There were no vitals taken for this visit.  Opioid Risk Score:   Fall Risk Score:  `1  Depression screen PHQ 2/9  Depression screen [facility] 2/9 08/03/2020 07/02/2020 03/19/2020 01/09/2020 12/11/2019 11/07/2019 11/01/2019  Decreased Interest 0 0 0 0 0 3 3  Down, Depressed, Hopeless 0 0 0 0 0 1 1  PHQ - 2 Score 0 0 0 0 0 4 4  Altered sleeping - - - - 0 3 3  Tired, decreased energy - - - - 0 3 3  Change in appetite - - - - 0 3 3  Feeling bad or failure about yourself  - - - - 0 2 1  Trouble concentrating - - - - 0 0 0  Moving slowly or fidgety/restless - - - - 0 0 0  Suicidal thoughts - - - - 0 0 -  PHQ-9 Score - - - - 0 15 14  Difficult doing work/chores - - - - - Extremely dIfficult Extremely dIfficult  Some recent data might be hidden   Review of Systems  Musculoskeletal: Positive for back pain.       Right shoulder pain  All other systems reviewed and are negative.      Objective:   Physical Exam Vitals and nursing note reviewed.  Constitutional:      Appearance: Normal appearance.  Cardiovascular:     Rate and Rhythm: Normal rate and regular rhythm.     Pulses: Normal pulses.     Heart sounds: Normal heart sounds.  Pulmonary:     Effort: Pulmonary effort is normal.     Breath sounds: Normal breath sounds.  Musculoskeletal:     Cervical back: Normal range of motion and neck supple.     Comments: Normal Muscle Bulk and Muscle Testing Reveals:  Upper Extremities: Full ROM and Muscle Strength 5/5 Right AC Joint Tenderness Lumbar Paraspinal Tenderness: L-3-L-5 Lower Extremities: Full ROM and Muscle Strength 5/5 Arises from Table with ease Narrow  Based  Gait   Skin:    General: Skin is warm and dry.  Neurological:     Mental Status: She is alert and oriented to person, place, and time.  Psychiatric:        Mood and Affect: Mood normal.        Behavior: Behavior normal.           Assessment & Plan:  1.Lumbar Postlaminectomy/ Spinal Stenosis Lumbar Region/ Post-Op Pain/Low back pain with radiating symptoms in L3-4 distribution: continues to be limited by pain. Refilled:MSIR 15 mg one tablet twice a day as needed for moderate to sever pain#60andMorphine Sulfate ER15 mg Q 8 hours.Second script e-scribed to accommodate scheduled appointment.09/30/2020. We will continue the opioid monitoring program, this consists of regular clinic visits, examinations, urine drug screen, pill counts as well as use of [place] Controlled Substance Reporting system. A 12 month History has been reviewed on the [place] Controlled Substance Reporting Systemon04/13/2022.Encouraged to Continue exercise regime.  2. Lumbar Radiculopathy:No complaints today.ContinueTopamax:.Continue to Monitor04/13/2022 3.ChronicRight Knee Pain:No complaints today.Continue with Ice TherapyandVoltaren Gel.04/132022 4. Muscle Spasm: Continuecurrent treatment: Tizanidine.09/30/2020 5. Lipoma: S/P Left Lipoma Excision of upper back By Dr. Brantley Stage on 01/03/2018. Surgery Following. Continue to Monitor.09/30/2020. 6. Paresthesia of Skin: ContinueTopamax.Continue to monitor.09/30/2020 7. Adhesive Capsulitis of Right Shoulder:Right Shoulder Tendonitis Pain:Schedule for Right Shoulder Cortisone Injection with Dr Letta Pate. S/PRight ShoulderCortisoneInjection.with Dr Letta Pate on 02/01/2022with good relief noted.Continue to monitor. Continue HEP as Tolerated.09/30/2020  F/U in 1 month

## 2020-10-06 ENCOUNTER — Encounter: Payer: Self-pay | Admitting: Family Medicine

## 2020-10-06 ENCOUNTER — Other Ambulatory Visit: Payer: Self-pay

## 2020-10-06 ENCOUNTER — Ambulatory Visit (INDEPENDENT_AMBULATORY_CARE_PROVIDER_SITE_OTHER): Payer: Medicare Other | Admitting: Family Medicine

## 2020-10-06 VITALS — BP 130/60 | HR 71 | Ht 64.0 in | Wt 221.0 lb

## 2020-10-06 DIAGNOSIS — R7309 Other abnormal glucose: Secondary | ICD-10-CM | POA: Diagnosis not present

## 2020-10-06 DIAGNOSIS — R600 Localized edema: Secondary | ICD-10-CM | POA: Diagnosis not present

## 2020-10-06 DIAGNOSIS — Z122 Encounter for screening for malignant neoplasm of respiratory organs: Secondary | ICD-10-CM | POA: Diagnosis not present

## 2020-10-06 DIAGNOSIS — I10 Essential (primary) hypertension: Secondary | ICD-10-CM

## 2020-10-06 DIAGNOSIS — E782 Mixed hyperlipidemia: Secondary | ICD-10-CM

## 2020-10-06 MED ORDER — CHLORTHALIDONE 25 MG PO TABS: 25.0000 mg | ORAL_TABLET | Freq: Every day | ORAL | 1 refills | Status: DC

## 2020-10-06 NOTE — Patient Instructions (Addendum)
It was great seeing you today!  Please check-out at the front desk before leaving the clinic. I'd like to see you back in 1 month but if you need to be seen earlier than that for any new issues we're happy to fit you in, just give [place] a call!  Visit Remembers: - Stop by the pharmacy to pick up your prescriptions  - Continue to work on your healthy eating habits and incorporating exercise into your daily life. - Your goal is to have an BP < 120/80  - We went up on your chlorthalidone and discontinued the PRN Lasix.   Today at your annual preventive visit we talked about the following measures:   I recommend 150 minutes of exercise per week-try 30 minutes 5 days per week We discussed reducing sugary beverages (like soda and juice) and increasing leafy greens and whole fruits.  We discussed avoiding tobacco and alcohol.  I recommend avoiding illicit substances.   Regarding lab work today:  Due to recent changes in healthcare laws, you may see the results of your imaging and laboratory studies on MyChart before your provider has had a chance to review them.  I understand that in some cases there may be results that are confusing or concerning to you. Not all laboratory results come back in the same time frame and you may be waiting for multiple results in order to interpret others.  Please give [place] 72 hours in order for your provider to thoroughly review all the results before contacting the office for clarification of your results. If everything is normal, you will get a letter in the mail or a message in My Chart. Please give [place] a call if you do not hear from [place] after 2 weeks.  Please bring all of your medications with you to each visit.    If you haven't already, sign up for My Chart to have easy access to your labs results, and communication with your primary care physician.  Feel free to call with any questions or concerns at any time, at [phone].   Take care,  Dr. Rushie Chestnut  [facility]

## 2020-10-06 NOTE — Progress Notes (Signed)
    SUBJECTIVE:   Chief compliant/HPI: annual examination  DAVY FAUGHT is a 65 y.o. who presents today for an annual exam.    History tabs reviewed and updated.   Review of systems form reviewed and notable for intermittent back pain (chronic).   OBJECTIVE:   BP 130/60   Pulse 71   Ht _0  (1.626 m)   Wt 221 lb (100.2 kg)   SpO2 98%   BMI 37.93 kg/m    GEN: pleasant well appearing female, in no acute distress  CV: regular rate and rhythm, no murmurs appreciated  RESP: no increased work of breathing, clear to ascultation bilaterally with no crackles, wheezes, or rhonchi  MSK: no LE edema, or calf tenderness, left lower back - spinal stimulator palpable  SKIN: warm, dry NEURO: alert and oriented, moves all extremities appropriately PSYCH: Normal affect, appropriate speech and behavior    ASSESSMENT/PLAN:   Essential hypertension Stable. Patient taking Chorthalidone and Lasix. Stop Lasix and increase Chorthalidone 25 mg. Recent ECHO reviewed. Normal EF with G1DD.  Follow up in 2-4 weeks. Return precautions given. Patient monitors her BP at home.    Annual Examination  See AVS for age appropriate recommendations  PHQ score 2, reviewed and discussed.  BP reviewed and at goal. Continue chlorthalidone .   Asked about intimate partner violence and resources given as appropriate  Advance directives discussion   Considered the following items based upon USPSTF recommendations: Diabetes screening: discussed and ordered Screening for elevated cholesterol: discussed and ordered HIV testing: discussed and declined as she is not sexually active in the last 7 years.  Hepatitis C: discussed Hepatitis B: discussed Syphilis if at high risk: discussed and declined as she is not sexually active in the last 7 years.  GC/CT not at high risk and not ordered.  Osteoporosis screening considered based upon risk of fracture from [facility] calculator. Major osteoporotic fracture risk is  5.5%. DEXA not ordered.  Reviewed risk factors for latent tuberculosis and not indicated   Discussed family history, BRCA testing not indicated. Cervical cancer screening: not indicated given history of hysterectomy with prior normal cytology.  Breast cancer screening: discussed potential benefits, risks including overdiagnosis and biopsy, elected proceed with mammogram. Colorectal cancer screening: up to date on screening for CRC. Lung cancer screening: discussed and >20 pk year hx and quit <15 years ago. . See documentation below regarding indications/risks/benefits.  Vaccinations: [place].   Follow up in 2-4 weeks.   Lyndee Hensen, [place]

## 2020-10-07 LAB — LIPID PANEL
Chol/HDL Ratio: 4.9 ratio — ABNORMAL HIGH (ref 0.0–4.4)
Cholesterol, Total: 190 mg/dL (ref 100–199)
HDL: 39 mg/dL — ABNORMAL LOW (ref >39)
LDL Chol Calc (NIH): 112 mg/dL — ABNORMAL HIGH (ref 0–99)
Triglycerides: 225 mg/dL — ABNORMAL HIGH (ref 0–149)
VLDL Cholesterol Cal: 39 mg/dL (ref 5–40)

## 2020-10-07 LAB — TOXASSURE SELECT,+ANTIDEPR,UR

## 2020-10-07 LAB — HEMOGLOBIN A1C
Est. average glucose Bld gHb Est-mCnc: 146 mg/dL
Hgb A1c MFr Bld: 6.7 % — ABNORMAL HIGH (ref 4.8–5.6)

## 2020-10-08 ENCOUNTER — Encounter: Payer: Self-pay | Admitting: Family Medicine

## 2020-10-08 NOTE — Assessment & Plan Note (Signed)
Stable. Patient taking Chorthalidone and Lasix. Stop Lasix and increase Chorthalidone 25 mg. Recent ECHO reviewed. Normal EF with G1DD.  Follow up in 2-4 weeks. Return precautions given. Patient monitors her BP at home.

## 2020-10-09 ENCOUNTER — Telehealth: Payer: Self-pay | Admitting: *Deleted

## 2020-10-09 NOTE — Telephone Encounter (Signed)
Urine drug screen for this encounter is consistent for prescribed medication 

## 2020-10-22 ENCOUNTER — Inpatient Hospital Stay: Admission: RE | Admit: 2020-10-22 | Payer: Medicare Other | Source: Ambulatory Visit

## 2020-10-26 ENCOUNTER — Other Ambulatory Visit: Payer: Self-pay | Admitting: Family Medicine

## 2020-10-26 DIAGNOSIS — J301 Allergic rhinitis due to pollen: Secondary | ICD-10-CM

## 2020-10-27 ENCOUNTER — Ambulatory Visit: Payer: Medicare Other | Admitting: Registered Nurse

## 2020-10-27 ENCOUNTER — Encounter: Payer: Medicare Other | Attending: Physical Medicine & Rehabilitation | Admitting: Physical Medicine & Rehabilitation

## 2020-10-27 ENCOUNTER — Other Ambulatory Visit: Payer: Self-pay | Admitting: *Deleted

## 2020-10-27 ENCOUNTER — Other Ambulatory Visit: Payer: Self-pay

## 2020-10-27 ENCOUNTER — Encounter: Payer: Self-pay | Admitting: Physical Medicine & Rehabilitation

## 2020-10-27 VITALS — BP 151/82 | HR 59 | Temp 98.7°F | Ht 64.0 in | Wt 215.8 lb

## 2020-10-27 DIAGNOSIS — M7501 Adhesive capsulitis of right shoulder: Secondary | ICD-10-CM | POA: Diagnosis not present

## 2020-10-27 MED ORDER — MORPHINE SULFATE ER 15 MG PO TBCR: 15.0000 mg | EXTENDED_RELEASE_TABLET | Freq: Three times a day (TID) | ORAL | 0 refills | Status: DC

## 2020-10-27 MED ORDER — MORPHINE SULFATE 15 MG PO TABS: 15.0000 mg | ORAL_TABLET | Freq: Two times a day (BID) | ORAL | 0 refills | Status: DC | PRN

## 2020-10-27 NOTE — Patient Instructions (Signed)
Adhesive Capsulitis  Adhesive capsulitis, also called frozen shoulder, causes the shoulder to become stiff and painful to move. This condition happens when there is inflammation of the tendons and ligaments that surround the shoulder joint (shoulder capsule). What are the causes? This condition may be caused by:  An injury to your shoulder joint.  Straining your shoulder.  Not moving your shoulder for a period of time. This can happen if your arm was injured or in a sling.  Long-standing conditions, such as: ? Diabetes. ? Thyroid problems. ? Heart disease. ? Stroke. ? Rheumatoid arthritis. ? Lung disease. In some cases, the cause is not known. What increases the risk? You are more likely to develop this condition if you are:  A woman.  Older than 64 years of age. What are the signs or symptoms? Symptoms of this condition include:  Pain in your shoulder when you move your arm. There may also be pain when parts of your shoulder are touched. The pain may be worse at night or when you are resting.  A sore or aching shoulder.  The inability to move your shoulder normally.  Muscle spasms. How is this diagnosed? This condition is diagnosed with a physical exam and imaging tests, such as an X-ray or MRI. How is this treated? This condition may be treated with:  Treatment of the underlying cause or condition.  Medicine. Medicine may be given to relieve pain, inflammation, or muscle spasms.  Steroid injections into the shoulder joint.  Physical therapy. This involves performing exercises to get the shoulder moving again.  Acupuncture. This is a type of treatment that involves stimulating specific points on your body by inserting thin needles through your skin.  Shoulder manipulation. This is a procedure to move the shoulder into another position. It is done after you are given a medicine to make you fall asleep (general anesthetic). The joint may also be injected with salt  water at high pressure to break down scarring.  Surgery. This may be done in severe cases when other treatments have failed. Although most people recover completely from adhesive capsulitis, some may not regain full shoulder movement. Follow these instructions at home: Managing pain, stiffness, and swelling  If directed, put ice on the injured area: ? Put ice in a plastic bag. ? Place a towel between your skin and the bag. ? Leave the ice on for 20 minutes, 2-3 times per day.  If directed, apply heat to the affected area before you exercise. Use the heat source that your health care provider recommends, such as a moist heat pack or a heating pad. ? Place a towel between your skin and the heat source. ? Leave the heat on for 20-30 minutes. ? Remove the heat if your skin turns bright red. This is especially important if you are unable to feel pain, heat, or cold. You may have a greater risk of getting burned.      General instructions  Take over-the-counter and prescription medicines only as told by your health care provider.  If you are being treated with physical therapy, follow instructions from your physical therapist.  Avoid exercises that put a lot of demand on your shoulder, such as throwing. These exercises can make pain worse.  Keep all follow-up visits as told by your health care provider. This is important. Contact a health care provider if:  You develop new symptoms.  Your symptoms get worse. Summary  Adhesive capsulitis, also called frozen shoulder, causes the shoulder to  become stiff and painful to move.  You are more likely to have this condition if you are a woman and over age 37.  It is treated with physical therapy, medicines, and sometimes surgery. This information is not intended to replace advice given to you by your health care provider. Make sure you discuss any questions you have with your health care provider. Document Revised: 11/10/2017 Document  Reviewed: 11/10/2017 Elsevier Patient Education  2021 [employer].

## 2020-10-27 NOTE — Telephone Encounter (Signed)
PMP was Reviewed. Medication e- scribed today. [name] [name] is aware of the above.

## 2020-10-27 NOTE — Progress Notes (Signed)
Shoulder injectionRIght  Without ultrasound guidance)  Indication:right Shoulder pain not relieved by medication management and other conservative care.  Informed consent was obtained after describing risks and benefits of the procedure with the patient, this includes bleeding, bruising, infection and medication side effects. The patient wishes to proceed and has given written consent. Patient was placed in a seated position. The Right shoulder was marked and prepped with betadine in the subacromial area. A 25-gauge 1-1/2 inch needle was inserted into the subacromial area. After negative draw back for blood, a solution containing 1 mL of 6 mg per ML betamethasone and 4 mL of 1% lidocaine was injected. A band aid was applied. The patient tolerated the procedure well. Post procedure instructions were given.  If pt get only short term relief with this injection <22mo, would not repeat  F/u NP 1 mo

## 2020-11-03 ENCOUNTER — Ambulatory Visit
Admission: RE | Admit: 2020-11-03 | Discharge: 2020-11-03 | Disposition: A | Discharge status: Home / Self Care | Payer: Medicare Other | Source: Ambulatory Visit | Attending: Family Medicine | Admitting: Family Medicine

## 2020-11-03 ENCOUNTER — Other Ambulatory Visit: Payer: Self-pay

## 2020-11-03 DIAGNOSIS — Z87891 Personal history of nicotine dependence: Secondary | ICD-10-CM | POA: Diagnosis not present

## 2020-11-03 DIAGNOSIS — I700 Atherosclerosis of aorta: Secondary | ICD-10-CM | POA: Diagnosis not present

## 2020-11-03 DIAGNOSIS — J432 Centrilobular emphysema: Secondary | ICD-10-CM | POA: Diagnosis not present

## 2020-11-03 DIAGNOSIS — Z122 Encounter for screening for malignant neoplasm of respiratory organs: Secondary | ICD-10-CM

## 2020-11-03 DIAGNOSIS — I2510 Atherosclerotic heart disease of native coronary artery without angina pectoris: Secondary | ICD-10-CM | POA: Diagnosis not present

## 2020-11-06 ENCOUNTER — Ambulatory Visit: Payer: Medicare Other | Admitting: Family Medicine

## 2020-11-26 ENCOUNTER — Other Ambulatory Visit: Payer: Self-pay

## 2020-11-26 ENCOUNTER — Encounter: Payer: Self-pay | Admitting: Registered Nurse

## 2020-11-26 ENCOUNTER — Encounter: Payer: Medicare Other | Attending: Physical Medicine & Rehabilitation | Admitting: Registered Nurse

## 2020-11-26 VITALS — BP 138/80 | HR 64 | Temp 98.1°F | Ht 64.0 in | Wt 215.8 lb

## 2020-11-26 DIAGNOSIS — Z79891 Long term (current) use of opiate analgesic: Secondary | ICD-10-CM | POA: Diagnosis not present

## 2020-11-26 DIAGNOSIS — Z5181 Encounter for therapeutic drug level monitoring: Secondary | ICD-10-CM | POA: Diagnosis not present

## 2020-11-26 DIAGNOSIS — M4807 Spinal stenosis, lumbosacral region: Secondary | ICD-10-CM | POA: Diagnosis not present

## 2020-11-26 DIAGNOSIS — M961 Postlaminectomy syndrome, not elsewhere classified: Secondary | ICD-10-CM | POA: Insufficient documentation

## 2020-11-26 DIAGNOSIS — M62838 Other muscle spasm: Secondary | ICD-10-CM | POA: Insufficient documentation

## 2020-11-26 DIAGNOSIS — G894 Chronic pain syndrome: Secondary | ICD-10-CM | POA: Diagnosis not present

## 2020-11-26 MED ORDER — MORPHINE SULFATE 15 MG PO TABS: 15.0000 mg | ORAL_TABLET | Freq: Two times a day (BID) | ORAL | 0 refills | Status: DC | PRN

## 2020-11-26 MED ORDER — MORPHINE SULFATE ER 15 MG PO TBCR: 15.0000 mg | EXTENDED_RELEASE_TABLET | Freq: Three times a day (TID) | ORAL | 0 refills | Status: DC

## 2020-11-26 NOTE — Progress Notes (Signed)
Subjective:    Patient ID: [name] [name], female    DOB: February 23, 1956, 65 y.o.   MRN: [id number]  HPI: [name] [name] is a 65 y.o. female who returns for follow up appointment for chronic pain and medication refill. She states her pain is located in her lower back. She rates her pain 3. Her current exercise regime is walking and performing stretching exercises.   Ms. Clegg Morphine equivalent is 75.00 MME.   Last UDS was Performed on 09/30/2020, it was consistent.    Pain Inventory Average Pain 3 Pain Right Now 3 My pain is constant, sharp, and aching  In the last 24 hours, has pain interfered with the following? General activity 4 Relation with others 4 Enjoyment of life 4 What TIME of day is your pain at its worst? morning , daytime, and night Sleep (in general) Poor  Pain is worse with: walking, inactivity, and standing Pain improves with: rest, heat/ice, and medication Relief from Meds: 1  Family History  Problem Relation Age of Onset   Heart disease Mother 53       CHF; CAD   Hypertension Mother    Heart failure Mother    Diabetes Sister    Lupus Sister    Kidney failure Sister    Hypertension Brother    Hyperlipidemia Sister    Hypertension Sister    Colon cancer Neg Hx    Rectal cancer Neg Hx    Social History   Socioeconomic History   Marital status: Widowed    Spouse name: Not on file   Number of children: 3   Years of education: Not on file   Highest education level: Not on file  Occupational History   Occupation: disability    Comment: 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  Tobacco Use   Smoking status: Former    Packs/day: 0.50    Years: 20.00    Pack years: 10.00    Types: Cigarettes    Quit date: 07/21/2008    Years since quitting: 12.3   Smokeless tobacco: Never  Vaping Use   Vaping Use: Never used  Substance and Sexual Activity   Alcohol use: No   Drug use: No   Sexual activity: Not on file  Other Topics Concern   Not on file  Social History  Narrative   Marital status: widowed since 2002; not dating which is bad in 2018      Children: 3 children (45, 48, 40); 13 grandchildren; 1 gg      Employment: disability since 2009; retail      Lives: alone in [place] in apartment      Tobacco: quit in 2011      Alcohol: none      Drugs: none      Exercise: minimal in 2018      ADLs: independent with ADLs; drives      Seatbelt: 100%; no texting.   Social Determinants of Health   Financial Resource Strain: Not on file  Food Insecurity: Not on file  Transportation Needs: Not on file  Physical Activity: Not on file  Stress: Not on file  Social Connections: Not on file   Past Surgical History:  Procedure Laterality Date   ABDOMINAL HYSTERECTOMY  1982   DUB; ovaries intact   bladder surg  1992   bladder retraction   BREAST BIOPSY     BREAST [place]  05/11/11   [facility] Lysis of adhesions Crohn's   MASS EXCISION Left 01/03/2018   Procedure: EXCISION LEFT UPPER BACK LIPOMA ERAS PATH;  Surgeon: Erroll Luna, MD;  Location: [place];  Service: General;  Laterality: Left;   REDUCTION MAMMAPLASTY     SPINE SURGERY     5 total (1 upper, 4 lumbar)   SPINE SURGERY     nerve stimulator on right side   Past Surgical History:  Procedure Laterality Date   ABDOMINAL HYSTERECTOMY  1982   DUB; ovaries intact   bladder surg  1992   bladder retraction   BREAST BIOPSY     BREAST REDUCTION SURGERY  1984   CARDIAC CATHETERIZATION  1990's   [name]   COLON SURGERY  05/11/11   [facility] Lysis of adhesions Crohn's   MASS EXCISION Left 01/03/2018   Procedure: EXCISION LEFT UPPER BACK LIPOMA ERAS PATH;  Surgeon: Erroll Luna, MD;  Location: [place];  Service: General;  Laterality: Left;   REDUCTION MAMMAPLASTY     SPINE SURGERY     5 total (1 upper, 4 lumbar)   SPINE SURGERY     nerve stimulator on right side   Past  Medical History:  Diagnosis Date   Anemia    Blood transfusion without reported diagnosis    Crohn's disease ([place])    Disorder of sacroiliac joint    Hyperlipidemia    Hypertension    Ischemic colitis ([place])    Lumbago    Lumbar post-laminectomy syndrome    Lumbosacral neuritis    Lumbosacral radiculitis    Sciatica    BP 138/80   Pulse (!) 53   Temp 98.1 F (36.7 C)   Ht 5\' 4"  (1.626 m)   Wt 215 lb 12.8 oz (97.9 kg)   SpO2 95%   BMI 37.04 kg/m   Opioid Risk Score:   Fall Risk Score:  `1  Depression screen PHQ 2/9  Depression screen [facility] 2/9 11/26/2020 10/27/2020 10/06/2020 09/30/2020 08/03/2020 07/02/2020 03/19/2020  Decreased Interest 0 0 0 0 0 0 0  Down, Depressed, Hopeless 0 0 0 0 0 0 0  PHQ - 2 Score 0 0 0 0 0 0 0  Altered sleeping - - 1 - - - -  Tired, decreased energy - - 0 - - - -  Change in appetite - - 1 - - - -  Feeling bad or failure about yourself  - - 0 - - - -  Trouble concentrating - - 0 - - - -  Moving slowly or fidgety/restless - - 0 - - - -  Suicidal thoughts - - 0 - - - -  PHQ-9 Score - - 2 - - - -  Difficult doing work/chores - - Not difficult at all - - - -  Some recent data might be hidden    Review of Systems  Musculoskeletal:  Positive for back pain and gait problem.  All other systems reviewed and are negative.     Objective:   Physical Exam Vitals and nursing note reviewed.  Constitutional:      Appearance: Normal appearance.  Cardiovascular:     Rate and Rhythm: Normal rate and regular rhythm.     Pulses: Normal pulses.     Heart sounds: Normal heart sounds.  Pulmonary:     Effort: Pulmonary effort is normal.     Breath sounds: Normal breath sounds.  Musculoskeletal:     Cervical back: Normal range of  motion and neck supple.     Comments: Normal Muscle Bulk and Muscle Testing Reveals:  Upper Extremities: Full ROM and Muscle Strength  5/5  Lumbar Paraspinal Tenderness: L-4-L-5 Lower Extremities: Full ROM and Muscle Strength  5/5 Arises from Table slowly using cane Narrow Based Gait     Skin:    General: Skin is warm and dry.  Neurological:     Mental Status: She is alert and oriented to person, place, and time.  Psychiatric:        Mood and Affect: Mood normal.        Behavior: Behavior normal.         Assessment & Plan:  1.Lumbar Postlaminectomy/ Spinal Stenosis Lumbar Region/ Post-Op Pain/Low back pain with radiating symptoms in L3-4 distribution:  continues to be limited by pain. Refilled:  MSIR 15 mg one tablet twice a day as needed for moderate to sever pain #60 and  Morphine Sulfate ER 15 mg Q 8 hours.Second script e-scribed to accommodate scheduled appointment. 11/26/2020. We will continue the opioid monitoring program, this consists of regular clinic visits, examinations, urine drug screen, pill counts as well as use of [place] Controlled Substance Reporting system. A 12 month History has been reviewed on the [place] Controlled Substance Reporting System on 11/26/2020. Encouraged to Continue exercise regime.   2. Lumbar Radiculopathy: No complaints today. Continue Topamax:.Continue to Monitor 11/26/2020 3.Chronic  Right Knee Pain: No complaints today.  Continue with Ice Therapy and  Voltaren Gel. 11/26/2020 4. Muscle Spasm: Continue current treatment: Tizanidine. 11/26/2020  5. Lipoma: S/P Left  Lipoma Excision of upper back  By Dr. Brantley Stage on 01/03/2018. Surgery Following. Continue to Monitor. 11/26/2020.   6. Paresthesia of Skin: Continue Topamax. Continue to monitor. 11/26/2020 7. Adhesive Capsulitis of Right Shoulder:Right Shoulder Tendonitis Pain:  S/P Right Shoulder Cortisone Injection with Dr Letta Pate with good relief noted. Continue to monitor. Continue HEP as Tolerated. 11/26/2020   F/U in 1 month

## 2020-12-04 ENCOUNTER — Other Ambulatory Visit: Payer: Self-pay | Admitting: Registered Nurse

## 2020-12-08 ENCOUNTER — Other Ambulatory Visit: Payer: Self-pay | Admitting: Family Medicine

## 2020-12-08 DIAGNOSIS — E876 Hypokalemia: Secondary | ICD-10-CM

## 2020-12-22 ENCOUNTER — Other Ambulatory Visit: Payer: Self-pay

## 2020-12-22 ENCOUNTER — Encounter: Payer: Self-pay | Admitting: Registered Nurse

## 2020-12-22 ENCOUNTER — Encounter: Payer: Medicare Other | Attending: Physical Medicine & Rehabilitation | Admitting: Registered Nurse

## 2020-12-22 VITALS — BP 133/69 | HR 56 | Temp 98.9°F | Ht 64.0 in | Wt 218.0 lb

## 2020-12-22 DIAGNOSIS — M961 Postlaminectomy syndrome, not elsewhere classified: Secondary | ICD-10-CM

## 2020-12-22 DIAGNOSIS — G894 Chronic pain syndrome: Secondary | ICD-10-CM

## 2020-12-22 DIAGNOSIS — M7501 Adhesive capsulitis of right shoulder: Secondary | ICD-10-CM | POA: Diagnosis not present

## 2020-12-22 DIAGNOSIS — Z5181 Encounter for therapeutic drug level monitoring: Secondary | ICD-10-CM

## 2020-12-22 DIAGNOSIS — M4807 Spinal stenosis, lumbosacral region: Secondary | ICD-10-CM | POA: Diagnosis not present

## 2020-12-22 DIAGNOSIS — R001 Bradycardia, unspecified: Secondary | ICD-10-CM | POA: Diagnosis not present

## 2020-12-22 DIAGNOSIS — M62838 Other muscle spasm: Secondary | ICD-10-CM | POA: Diagnosis not present

## 2020-12-22 DIAGNOSIS — M2550 Pain in unspecified joint: Secondary | ICD-10-CM

## 2020-12-22 DIAGNOSIS — Z79891 Long term (current) use of opiate analgesic: Secondary | ICD-10-CM | POA: Diagnosis not present

## 2020-12-22 MED ORDER — MORPHINE SULFATE ER 15 MG PO TBCR: 15.0000 mg | EXTENDED_RELEASE_TABLET | Freq: Three times a day (TID) | ORAL | 0 refills | Status: DC

## 2020-12-22 MED ORDER — MORPHINE SULFATE 15 MG PO TABS: 15.0000 mg | ORAL_TABLET | Freq: Two times a day (BID) | ORAL | 0 refills | Status: DC | PRN

## 2020-12-22 NOTE — Progress Notes (Signed)
Subjective:    Patient ID: [name] [name], female    DOB: 1956/05/01, 65 y.o.   MRN: [id number]  HPI: [name] [name] is a 65 y.o. female who returns for follow up appointment for chronic pain and medication refill. She states her pain is located in  her right shoulder, lower back pain and  generalized joint pain. She rates her pain 4.Her  current exercise regime is walking and performing stretching exercises.   Ms. Swailes Morphine equivalent is 75.00 MME.   Last UDS was Performed on 09/30/2020, it was consistent.   Pain Inventory Average Pain 4 Pain Right Now 4 My pain is constant and sharp  In the last 24 hours, has pain interfered with the following? General activity 4 Relation with others 4 Enjoyment of life 4 What TIME of day is your pain at its worst? morning , daytime, and night Sleep (in general) Poor  Pain is worse with: walking, inactivity, and standing Pain improves with: rest and heat/ice Relief from Meds: 1  Family History  Problem Relation Age of Onset   Heart disease Mother 5       CHF; CAD   Hypertension Mother    Heart failure Mother    Diabetes Sister    Lupus Sister    Kidney failure Sister    Hypertension Brother    Hyperlipidemia Sister    Hypertension Sister    Colon cancer Neg Hx    Rectal cancer Neg Hx    Social History   Socioeconomic History   Marital status: Widowed    Spouse name: Not on file   Number of children: 3   Years of education: Not on file   Highest education level: Not on file  Occupational History   Occupation: disability    Comment: 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  Tobacco Use   Smoking status: Former    Packs/day: 0.50    Years: 20.00    Pack years: 10.00    Types: Cigarettes    Quit date: 07/21/2008    Years since quitting: 12.4   Smokeless tobacco: Never  Vaping Use   Vaping Use: Never used  Substance and Sexual Activity   Alcohol use: No   Drug use: No   Sexual activity: Not on file  Other Topics Concern   Not  on file  Social History Narrative   Marital status: widowed since 2002; not dating which is bad in 2018      Children: 3 children (45, 4, 40); 13 grandchildren; 1 gg      Employment: disability since 2009; retail      Lives: alone in [place] in apartment      Tobacco: quit in 2011      Alcohol: none      Drugs: none      Exercise: minimal in 2018      ADLs: independent with ADLs; drives      Seatbelt: 100%; no texting.   Social Determinants of Health   Financial Resource Strain: Not on file  Food Insecurity: Not on file  Transportation Needs: Not on file  Physical Activity: Not on file  Stress: Not on file  Social Connections: Not on file   Past Surgical History:  Procedure Laterality Date   ABDOMINAL HYSTERECTOMY  1982   DUB; ovaries intact   bladder surg  1992   bladder retraction   BREAST BIOPSY     BREAST [place]  1990's  [place]   COLON SURGERY  05/11/11   [facility] Lysis of adhesions Crohn's   MASS EXCISION Left 01/03/2018   Procedure: EXCISION LEFT UPPER BACK LIPOMA ERAS PATH;  Surgeon: Erroll Luna, MD;  Location: [place];  Service: General;  Laterality: Left;   REDUCTION MAMMAPLASTY     SPINE SURGERY     5 total (1 upper, 4 lumbar)   SPINE SURGERY     nerve stimulator on right side   Past Surgical History:  Procedure Laterality Date   ABDOMINAL HYSTERECTOMY  1982   DUB; ovaries intact   bladder surg  1992   bladder retraction   BREAST BIOPSY     BREAST REDUCTION SURGERY  1984   CARDIAC CATHETERIZATION  1990's   [name]   COLON SURGERY  05/11/11   [facility] Lysis of adhesions Crohn's   MASS EXCISION Left 01/03/2018   Procedure: EXCISION LEFT UPPER BACK LIPOMA ERAS PATH;  Surgeon: Erroll Luna, MD;  Location: [place];  Service: General;  Laterality: Left;   REDUCTION MAMMAPLASTY     SPINE SURGERY     5 total (1 upper, 4 lumbar)   SPINE SURGERY     nerve stimulator on  right side   Past Medical History:  Diagnosis Date   Anemia    Blood transfusion without reported diagnosis    Crohn's disease ([place])    Disorder of sacroiliac joint    Hyperlipidemia    Hypertension    Ischemic colitis ([place])    Lumbago    Lumbar post-laminectomy syndrome    Lumbosacral neuritis    Lumbosacral radiculitis    Sciatica    BP 133/69   Pulse (!) 51   Temp 98.9 F (37.2 C)   Ht 5\' 4"  (1.626 m)   Wt 218 lb (98.9 kg)   SpO2 95%   BMI 37.42 kg/m   Opioid Risk Score:   Fall Risk Score:  `1  Depression screen PHQ 2/9  Depression screen [facility] 2/9 12/22/2020 11/26/2020 10/27/2020 10/06/2020 09/30/2020 08/03/2020 07/02/2020  Decreased Interest 0 0 0 0 0 0 0  Down, Depressed, Hopeless 0 0 0 0 0 0 0  PHQ - 2 Score 0 0 0 0 0 0 0  Altered sleeping - - - 1 - - -  Tired, decreased energy - - - 0 - - -  Change in appetite - - - 1 - - -  Feeling bad or failure about yourself  - - - 0 - - -  Trouble concentrating - - - 0 - - -  Moving slowly or fidgety/restless - - - 0 - - -  Suicidal thoughts - - - 0 - - -  PHQ-9 Score - - - 2 - - -  Difficult doing work/chores - - - Not difficult at all - - -  Some recent data might be hidden    Review of Systems  Musculoskeletal:  Positive for back pain.  All other systems reviewed and are negative.     Objective:   Physical Exam Vitals and nursing note reviewed.  Constitutional:      Appearance: Normal appearance.  Cardiovascular:     Rate and Rhythm: Normal rate and regular rhythm.     Pulses: Normal pulses.     Heart sounds: Normal heart sounds.  Pulmonary:     Effort: Pulmonary effort is normal.     Breath sounds: Normal breath sounds.  Musculoskeletal:     Cervical back: Normal  range of motion and neck supple.     Comments: Normal Muscle Bulk and Muscle Testing Reveals: Upper Extremities: Right: Decreased ROM 90 Degrees  and Muscle Strength 5/5 Left: Full ROM and Muscle Strength 5/5 Right AC Joint Tenderness Lumbar  Paraspinal Tenderness: L-4-L-5 Lower Extremities: Full ROM and Muscle Strength 5/5 Arises from Table slowly  Narrow Based  Gait     Skin:    General: Skin is warm and dry.  Neurological:     Mental Status: She is alert and oriented to person, place, and time.  Psychiatric:        Mood and Affect: Mood normal.        Behavior: Behavior normal.         Assessment & Plan:  1.Lumbar Postlaminectomy/ Spinal Stenosis Lumbar Region/ Post-Op Pain/Low back pain with radiating symptoms in L3-4 distribution:  continues to be limited by pain. Refilled:  MSIR 15 mg one tablet twice a day as needed for moderate to sever pain #60 and  Morphine Sulfate ER 15 mg Q 8 hours.Second script e-scribed to accommodate scheduled appointment. 12/22/2020. We will continue the opioid monitoring program, this consists of regular clinic visits, examinations, urine drug screen, pill counts as well as use of [place] Controlled Substance Reporting system. A 12 month History has been reviewed on the [place] Controlled Substance Reporting System on 11/26/2020. Encouraged to Continue exercise regime.   2. Lumbar Radiculopathy: No complaints today. Continue Topamax:.Continue to Monitor 12/22/2020 3.Chronic  Right Knee Pain: No complaints today.  Continue with Ice Therapy and  Voltaren Gel. 12/22/2020 4. Muscle Spasm: Continue current treatment: Tizanidine. 12/22/2020  5. Lipoma: S/P Left  Lipoma Excision of upper back  By Dr. Brantley Stage on 01/03/2018. Surgery Following. Continue to Monitor. 12/22/2020.   6. Paresthesia of Skin: Continue Topamax. Continue to monitor. 12/22/2020 7. Adhesive Capsulitis of Right Shoulder:Right Shoulder Tendonitis Pain:  Schedule Right Shoulder Cortisone Injection with Dr Letta Pate. Continue to monitor. Continue HEP as Tolerated. 12/22/2020  8. Polyarthralgia: Continue HEP as Tolerated. Continue to Monitor. 9. Bradycardia: Apical Pulse check: Ms. Hutmacher will F/U with her PCP, she was  instructed to keep a journal of her pulse and report readings to her PCP. She verbalizes understanding.   F/U in 1 month

## 2020-12-24 ENCOUNTER — Other Ambulatory Visit: Payer: Self-pay

## 2020-12-24 ENCOUNTER — Ambulatory Visit (INDEPENDENT_AMBULATORY_CARE_PROVIDER_SITE_OTHER): Payer: Medicare Other | Admitting: Family Medicine

## 2020-12-24 ENCOUNTER — Encounter: Payer: Self-pay | Admitting: Family Medicine

## 2020-12-24 ENCOUNTER — Ambulatory Visit (HOSPITAL_COMMUNITY): Payer: Medicare Other | Attending: Family Medicine

## 2020-12-24 VITALS — BP 120/60 | HR 67 | Ht 64.0 in | Wt 216.2 lb

## 2020-12-24 DIAGNOSIS — R002 Palpitations: Secondary | ICD-10-CM | POA: Diagnosis not present

## 2020-12-24 DIAGNOSIS — R001 Bradycardia, unspecified: Secondary | ICD-10-CM | POA: Diagnosis not present

## 2020-12-24 NOTE — Progress Notes (Signed)
    SUBJECTIVE:   CHIEF COMPLAINT / HPI: bradycardia  65 yo paitient presents today for evaluation of bradycardia. She has chronic pain and is followed by PM&R. Recently she went to an appt and had a pulse of 51. Looking at chart review of vital signs, her pulse is usually in the 50s or 60s for about the last year. She reports that she has had some weird episodes that happen every time she takes a shower. Just as she is finishing her shower, she becomes dizzy, flushed, and diaphoretic. She will dry off from shower, but then become soaking wet from intense diaphoresis. She has to lie down and this will pass after a few minutes. She also sometimes has pain in her right neck when this happens. She denies any falls, CP, SOB. She feels like her heart is "beating funny" during these episodes.  PERTINENT  PMH / PSH: crohn's disease, DJD, HTN, HLD  OBJECTIVE:   BP 120/60   Pulse 67   Ht 5\' 4"  (1.626 m)   Wt 216 lb 4 oz (98.1 kg)   SpO2 96%   BMI 37.12 kg/m   Nursing note and vitals reviewed GEN: age-appropriate, AAW, resting comfortably in chair, NAD, obese, alert and at baseline HEENT: NCAT. PERRLA. Sclera without injection or icterus. Neck: Supple. Thyroid smooth, not palpable, no LAD Cardiac: Regular rate and rhythm. Normal S1/S2. No murmurs, rubs, or gallops appreciated. 2+ radial pulses. No carotid bruits. Lungs: Clear bilaterally to ascultation. No increased WOB, no accessory muscle usage. No w/r/r. Ext: no edema Psych: Pleasant and appropriate   ASSESSMENT/PLAN:   Bradycardia 65 yo patient with bradycardia, palpitations, and concerning symptoms including diaphoresis. EKG obtained today shows sinus bradycardia. Will obtain TSH and CBC r/o anemia. Will order echo. Recommend referral to cardiology for zio patch. Discussed return precautions. - TSH, CBC - Echo - Referral to cardiology     Gladys Damme, [place]

## 2020-12-24 NOTE — Patient Instructions (Addendum)
It was a pleasure to see you today!  For your slow heart beat, I want to do several things:  We will get some labs today.  If they are abnormal or we need to do something about them, I will call you.  If they are normal, I will send you a message on MyChart (if it is active) or a letter in the mail.  If you don't hear from [place] in 2 weeks, please call the office  [phone]. I will order an ultrasound of your heart. We will schedule this for you and let you know the date. I have placed a referral for cardiology. You should receive a phone call in 1-2 weeks to schedule this appointment. If for any reason you do not receive a phone call or need more help scheduling this appointment, please call our office at [phone]. Follow up with [place] after you see cardiology. If you have symptoms of chest pain, shortness of breath, trouble breathing, call our office [phone] and/or go to the nearest emergency department.    Be Well,  Dr. Chauncey Reading

## 2020-12-25 LAB — CBC WITH DIFFERENTIAL/PLATELET
Basophils Absolute: 0 10*3/uL (ref 0.0–0.2)
Basos: 1 %
EOS (ABSOLUTE): 0.1 10*3/uL (ref 0.0–0.4)
Eos: 3 %
Hematocrit: 37.7 % (ref 34.0–46.6)
Hemoglobin: 12.3 g/dL (ref 11.1–15.9)
Immature Grans (Abs): 0 10*3/uL (ref 0.0–0.1)
Immature Granulocytes: 0 %
Lymphocytes Absolute: 2 10*3/uL (ref 0.7–3.1)
Lymphs: 42 %
MCH: 26.9 pg (ref 26.6–33.0)
MCHC: 32.6 g/dL (ref 31.5–35.7)
MCV: 83 fL (ref 79–97)
Monocytes Absolute: 0.5 10*3/uL (ref 0.1–0.9)
Monocytes: 11 %
Neutrophils Absolute: 2 10*3/uL (ref 1.4–7.0)
Neutrophils: 43 %
Platelets: 245 10*3/uL (ref 150–450)
RBC: 4.57 x10E6/uL (ref 3.77–5.28)
RDW: 12.6 % (ref 11.7–15.4)
WBC: 4.7 10*3/uL (ref 3.4–10.8)

## 2020-12-25 LAB — TSH: TSH: 2.37 u[IU]/mL (ref 0.450–4.500)

## 2020-12-27 DIAGNOSIS — R001 Bradycardia, unspecified: Secondary | ICD-10-CM | POA: Insufficient documentation

## 2020-12-27 NOTE — Assessment & Plan Note (Signed)
65 yo patient with bradycardia, palpitations, and concerning symptoms including diaphoresis. EKG obtained today shows sinus bradycardia. Will obtain TSH and CBC r/o anemia. Will order echo. Recommend referral to cardiology for zio patch. Discussed return precautions. - TSH, CBC - Echo - Referral to cardiology

## 2020-12-31 ENCOUNTER — Other Ambulatory Visit: Payer: Self-pay | Admitting: Family Medicine

## 2020-12-31 DIAGNOSIS — I10 Essential (primary) hypertension: Secondary | ICD-10-CM

## 2021-01-01 ENCOUNTER — Other Ambulatory Visit: Payer: Self-pay

## 2021-01-01 ENCOUNTER — Ambulatory Visit (INDEPENDENT_AMBULATORY_CARE_PROVIDER_SITE_OTHER): Payer: Medicare Other

## 2021-01-01 ENCOUNTER — Ambulatory Visit (INDEPENDENT_AMBULATORY_CARE_PROVIDER_SITE_OTHER): Payer: Medicare Other | Admitting: Cardiovascular Disease

## 2021-01-01 ENCOUNTER — Encounter (HOSPITAL_BASED_OUTPATIENT_CLINIC_OR_DEPARTMENT_OTHER): Payer: Self-pay | Admitting: Cardiovascular Disease

## 2021-01-01 VITALS — BP 140/72 | HR 56 | Ht 64.0 in | Wt 216.4 lb

## 2021-01-01 DIAGNOSIS — R42 Dizziness and giddiness: Secondary | ICD-10-CM

## 2021-01-01 DIAGNOSIS — I10 Essential (primary) hypertension: Secondary | ICD-10-CM

## 2021-01-01 DIAGNOSIS — R400 Somnolence: Secondary | ICD-10-CM | POA: Diagnosis not present

## 2021-01-01 DIAGNOSIS — R0683 Snoring: Secondary | ICD-10-CM

## 2021-01-01 DIAGNOSIS — E7800 Pure hypercholesterolemia, unspecified: Secondary | ICD-10-CM | POA: Diagnosis not present

## 2021-01-01 DIAGNOSIS — R001 Bradycardia, unspecified: Secondary | ICD-10-CM

## 2021-01-01 NOTE — Assessment & Plan Note (Signed)
Her symptoms seem multifactorial.  She does have bradycardia which may be contributing.  However she reports dizziness with movements of her head which sound more like vertigo.  She has known cervical disc disease which could be contributing

## 2021-01-01 NOTE — Assessment & Plan Note (Signed)
Lipids poorly controlled.  Increase atorvastatin to 40mg .  Repeat lipids/CMP in 2-3 months.

## 2021-01-01 NOTE — Assessment & Plan Note (Addendum)
She has chronic bradycardia that seems to be getting worse.  She also gets lightheaded after gettting out of the shower which may be vagally mediated.  She also notes increased fatigue.  We will get a 3 day Zio.  Thyroid has been normal.  Symptoms ase concerning for OSA.  We will get a sleep study.  Based on the above work up she will likely need referral to EP.

## 2021-01-01 NOTE — Assessment & Plan Note (Signed)
Blood pressure slightly above goal.  However given her bradycardia and dizziness, we will not be more aggressive.  Continue chlorthalidone.  We did discuss the importance of avoiding dehydration.

## 2021-01-01 NOTE — Patient Instructions (Addendum)
Medication Instructions:  Your physician recommends that you continue on your current medications as directed. Please refer to the Current Medication list given to you today.   *If you need a refill on your cardiac medications before your next appointment, please call your pharmacy*   Lab Work: NONE   Testing/Procedures: Your physician has recommended that you have a sleep study. This test records several body functions during sleep, including: brain activity, eye movement, oxygen and carbon dioxide blood levels, heart rate and rhythm, breathing rate and rhythm, the flow of air through your mouth and nose, snoring, body muscle movements, and chest and belly movement. ONCE INSURANCE HAS BEEN REVIEWED THE OFFICE WILL CALL YOU TO ARRANGE IF YOU DO NO HEAR IN 2 WEEKS CALL THE OFFICE TO FOLLOW UP   Your physician has requested that you have a carotid duplex. This test is an ultrasound of the carotid arteries in your neck. It looks at blood flow through these arteries that supply the brain with blood. Allow one hour for this exam. There are no restrictions or special instructions. [facility] [place] [address]  3 DAY ZIO MONITOR   Follow-Up: At [facility], you and your health needs are our priority.  As part of our continuing mission to provide you with exceptional heart care, we have created designated Provider Care Teams.  These Care Teams include your primary Cardiologist (physician) and Advanced Practice Providers (APPs -  Physician Assistants and Nurse Practitioners) who all work together to provide you with the care you need, when you need it.  We recommend signing up for the patient portal called "MyChart".  Sign up information is provided on this After Visit Summary.  MyChart is used to connect with patients for Virtual Visits (Telemedicine).  Patients are able to view lab/test results, encounter notes, upcoming appointments, etc.  Non-urgent messages can be sent to your  provider as well.   To learn more about what you can do with MyChart, go to [email or web address].    Your next appointment:   4 week(s)  The format for your next appointment:   In Person  Provider:   Laurann [place], NP

## 2021-01-01 NOTE — Progress Notes (Signed)
Cardiology Office Note:    Date:  01/01/2021   ID:  [name] [name], DOB 01-Aug-1955, MRN [id number]  PCP:  Lyndee Hensen, DO   [facility] Providers Cardiologist:  None     Referring MD: Kinnie Feil, MD   No chief complaint on file.   History of Present Illness:    [name] [name] is a 65 y.o. female with a hx of anemia, Crohn's disease, hyperlipidemia, hypertension, and sciatica here for initial evaluation of heart palpitations. She saw her PCP on 12/24/2020 and it was noted her heart rate was in the 50s and 60s  She reported getting dizzy after getting out of the shower. They did an EKG which showed sinus bradycardia, and inferolateral T wave inversions. An Echo was ordered but has not been performed. Previous Echo 10/2019 revealed LVEF 55-60% with grade 1 diastolic dysfunction and moderate LVEH. She had a nuclear stress test 05/2016 that was negative for ischemia.   Today, for a few years she has been noticing her slow heart rate. After it slows down, her heart will then suddenly pump faster "as if it needs to jump-start" and sometimes making her lightheaded. Usually she needs to stop and hold the wall or shower curtain until the symptoms dissipate. These HR episodes are fairly constant and occur every day, but have never made her have a full syncopal episode. Today she reports that these episodes are worsening. She notes that she becomes diaphoretic from minimal exertion or from just sitting down, and is also lightheaded from standing up or bending over. At one time, she was feeling severe chest pain like "sticking something in her chest" and "twisting a dagger in her chest." Additionally, she had some right neck pain and felt like her vein was swollen and distended. Also, she is going though back issues during the past few years. She has had 4 spinal infusions. Her formal exercise is severely and mostly limited by this back pain, and she can not walk for long before the pain  worsens. Typically she eats at 5-6 PM, but she has significant appetite loss at that time and may skip dinner. She makes sure she is well hydrated. Her sister tells her she snores, and sometimes she wakes up with dry mouth. Occasionally she may wake up in the morning with a start. While in bed, she also becomes dizzy and nauseous when turning to one side. She denies any shortness of breath, headaches, lower extremity edema, orthopnea, or PND.   Past Medical History:  Diagnosis Date   Anemia    Blood transfusion without reported diagnosis    Crohn's disease ([place])    Disorder of sacroiliac joint    Hyperlipidemia    Hypertension    Ischemic colitis ([place])    Lumbago    Lumbar post-laminectomy syndrome    Lumbosacral neuritis    Lumbosacral radiculitis    Sciatica     Past Surgical History:  Procedure Laterality Date   ABDOMINAL HYSTERECTOMY  1982   DUB; ovaries intact   bladder surg  1992   bladder retraction   BREAST BIOPSY     BREAST REDUCTION SURGERY  1984   CARDIAC CATHETERIZATION  1990's   [name]   COLON SURGERY  05/11/11   [facility] of adhesions Crohn's   MASS EXCISION Left 01/03/2018   Procedure: EXCISION LEFT UPPER BACK LIPOMA ERAS PATH;  Surgeon: Erroll Luna, MD;  Location: [place];  Service: General;  Laterality: Left;  REDUCTION MAMMAPLASTY     SPINE SURGERY     5 total (1 upper, 4 lumbar)   SPINE SURGERY     nerve stimulator on right side    Current Medications: Current Meds  Medication Sig   atorvastatin (LIPITOR) 20 MG tablet TAKE 1 TABLET BY MOUTH EVERY DAY   chlorthalidone (HYGROTON) 25 MG tablet Take 1 tablet (25 mg total) by mouth daily.   fluticasone (FLONASE) 50 MCG/ACT nasal spray SPRAY 2 SPRAYS INTO EACH NOSTRIL EVERY DAY   morphine (MS CONTIN) 15 MG 12 hr tablet Take 1 tablet (15 mg total) by mouth every 8 (eight) hours.   morphine (MSIR) 15 MG tablet Take 1 tablet (15 mg total) by mouth 2 (two) times daily as needed for  severe pain.   naloxone (NARCAN) nasal spray 4 mg/0.1 mL Lethargic or respiratory depression after opioid use   potassium chloride (KLOR-CON) 20 MEQ packet USE 1 PACKET AS INSTRUCTED ONCE A DAY   tiZANidine (ZANAFLEX) 2 MG tablet TAKE 1 TABLET (2 MG TOTAL) BY MOUTH 2 (TWO) TIMES DAILY AS NEEDED FOR MUSCLE SPASMS.   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  Tobacco Use   Smoking status: Former    Packs/day: 0.50    Years: 20.00    Pack years: 10.00    Types: Cigarettes    Quit date: 07/21/2008    Years since quitting: 12.4   Smokeless tobacco: Never  Vaping Use   Vaping Use: Never used  Substance and Sexual Activity   Alcohol use: No   Drug use: No   Sexual activity: Not on file  Other Topics Concern   Not on file  Social History Narrative   Marital status: widowed since 2002; not dating which is bad in 2018      Children: 3 children (45, 50, 40); 13 grandchildren; 1 gg      Employment: disability since 2009; retail      Lives: alone in [place] in apartment      Tobacco: quit in 2011      Alcohol: none      Drugs: none      Exercise: minimal in 2018      ADLs: independent with ADLs; drives      Seatbelt: 100%; no texting.   Social Determinants of Health   Financial Resource Strain: Not on file  Food Insecurity: Not on file  Transportation Needs: Not on file  Physical Activity: Not on file  Stress: Not on file  Social Connections: Not on file     Family History: The patient's family history includes Diabetes in her sister; Heart attack in her mother; Heart disease (age of onset: 74) in her mother; Heart failure in her  mother; Hyperlipidemia in her mother and sister; Hypertension in her brother, mother, and sister; Kidney failure in her sister; Lupus in her sister; Stroke in her maternal aunt. There is no history of Colon cancer or Rectal cancer.  ROS:   Please see the history of present illness.    (+) Episodes of atypical heart rate (+)  Lightheadedness (+) Diaphoresis (+) Chest pain (+) Severe back pain (+) Loss of appetite (+) Snores (+) Dry mouth (+) Dizziness (+) Nausea (+) Right neck pain All other systems reviewed and are negative.  EKGs/Labs/Other Studies Reviewed:    The following studies were reviewed today:  Echo 11/11/2019:  1. Left ventricular ejection fraction, by estimation, is 55 to 60%. Left  ventricular ejection fraction by 3D volume is 56 %. The left ventricle has  normal function. The left ventricle has no regional wall motion  abnormalities. There is moderate left  ventricular hypertrophy. Left ventricular diastolic parameters are  consistent with Grade I diastolic dysfunction (impaired relaxation). The  average left ventricular global longitudinal strain is -28.3 %. The global  longitudinal strain is normal.   2. Right ventricular systolic function is normal. The right ventricular  size is normal.   3. The mitral valve is degenerative. Trivial mitral valve regurgitation.  No evidence of mitral stenosis.   4. The aortic valve is tricuspid. Aortic valve regurgitation is not  visualized. No aortic stenosis is present.   5. The inferior vena cava is normal in size with greater than 50%  respiratory variability, suggesting right atrial pressure of 3 mmHg.   Comparison(s): No significant change from prior study.   EKG:   01/01/2021: Sinus bradycardia, Rate 52 bpm. Nonspecific T wave abnormalities.  Recent Labs: 12/24/2020: Hemoglobin 12.3; Platelets 245; TSH 2.370  Recent Lipid Panel    Component Value Date/Time   CHOL 190 10/06/2020 1439   TRIG 225 (H) 10/06/2020 1439    HDL 39 (L) 10/06/2020 1439   CHOLHDL 4.9 (H) 10/06/2020 1439   CHOLHDL 4.9 03/09/2016 1319   VLDL 51 (H) 03/09/2016 1319   LDLCALC 112 (H) 10/06/2020 1439   LDLDIRECT 107 (H) 10/31/2019 1013   STOP-Bang Score:  6       Physical Exam:    VS:  BP 140/72   Pulse (!) 56   Ht 5\' 4"  (1.626 m)   Wt 216 lb 6.4 oz (98.2 kg)   SpO2 99%   BMI 37.14 kg/m     Wt Readings from Last 3 Encounters:  01/01/21 216 lb 6.4 oz (98.2 kg)  12/24/20 216 lb 4 oz (98.1 kg)  12/22/20 218 lb (98.9 kg)     GEN: Well nourished, well developed in no acute distress HEENT: Normal NECK: No JVD; No carotid bruits LYMPHATICS: No lymphadenopathy CARDIAC: RRR, no murmurs, rubs, gallops RESPIRATORY:  Clear to auscultation without rales, wheezing or rhonchi  ABDOMEN: Soft, non-tender, non-distended MUSCULOSKELETAL:  No edema; No deformity  SKIN: Warm and dry NEUROLOGIC:  Alert and oriented x 3 PSYCHIATRIC:  Normal affect   ASSESSMENT:    1. Snoring   2. Bradycardia   3. Pure hypercholesterolemia   4. Dizziness   5. Daytime somnolence   6. Essential hypertension    PLAN:   Bradycardia She has chronic bradycardia that seems to be getting worse.  She also gets lightheaded after gettting out of the shower which may be vagally mediated.  She also notes increased fatigue.  We will get a 3 day Zio.  Thyroid has been normal.  Symptoms ase concerning for OSA.  We will get a sleep study.  Based on the above work up she will likely need referral to EP.  Hyperlipemia Lipids poorly controlled.  Increase atorvastatin to 40mg .  Repeat lipids/CMP in 2-3 months.    Dizziness Her symptoms seem multifactorial.  She does have bradycardia which may be contributing.  However she reports dizziness with movements of her head which sound more like vertigo.  She has known cervical disc disease which could be contributing  Essential hypertension Blood pressure slightly above goal.  However given her bradycardia and  dizziness, we will not be more aggressive.  Continue chlorthalidone.  We did discuss the importance of avoiding dehydration.   Disposition: FU with Vitaly Wanat C. Oval Linsey, MD, [facility] in 1 month   Medication Adjustments/Labs and Tests Ordered: Current medicines are reviewed at length with the patient today.  Concerns regarding medicines are outlined above.  Orders Placed This Encounter  Procedures   LONG TERM MONITOR (3-14 DAYS)   EKG 12-Lead   Split night study   VAS US CAROTID   No orders of the defined types were placed in this encounter.   Patient Instructions  Medication Instructions:  Your physician recommends that you continue on your current medications as directed. Please refer to the Current Medication list given to you today.   *If you need a refill on your cardiac medications before your next appointment, please call your pharmacy*   Lab Work: NONE   Testing/Procedures: Your physician has recommended that you have a sleep study. This test records several body functions during sleep, including: brain activity, eye movement, oxygen and carbon dioxide blood levels, heart rate and rhythm, breathing rate and rhythm, the flow of air through your mouth and nose, snoring, body muscle movements, and chest and belly movement. ONCE INSURANCE HAS BEEN REVIEWED THE OFFICE WILL CALL YOU TO ARRANGE IF YOU DO NO HEAR IN 2 WEEKS CALL THE OFFICE TO FOLLOW UP   Your physician has requested that you have a carotid duplex. This test is an ultrasound of the carotid arteries in your neck. It looks at blood flow through these arteries that supply the brain with blood. Allow one hour for this exam. There are no restrictions or special instructions. [facility] [place] [address]  3 DAY ZIO MONITOR   Follow-Up: At [facility], you and your health needs are our priority.  As part of our continuing mission to provide you with exceptional heart care, we have created designated  Provider Care Teams.  These Care Teams include your primary Cardiologist (physician) and Advanced Practice Providers (APPs -  Physician Assistants and Nurse Practitioners) who all work together to provide you with the care you need, when you need it.  We recommend signing up for the patient portal called "MyChart".  Sign up information is provided on this After Visit Summary.  MyChart is used to connect with patients for Virtual Visits (Telemedicine).  Patients are able to view lab/test results, encounter notes, upcoming appointments, etc.  Non-urgent messages can be sent to your provider as well.   To learn more about what you can do with MyChart, go to [email or web address].    Your next appointment:   4 week(s)  The format for your next appointment:   In Person  Provider:   Laurann Montana, NP       [facility] Stumpf,acting as a scribe for Skeet Latch, MD.,have documented all relevant documentation on the behalf of Skeet Latch, MD,as directed by  Skeet Latch, MD while in the presence of Skeet Latch, MD.  I, [place] Oval Linsey, MD have reviewed all documentation for this visit.  The documentation of the exam, diagnosis, procedures, and orders on 01/01/2021 are all accurate and complete.   Signed, Skeet Latch, MD  01/01/2021 1:08 PM    [facility] 

## 2021-01-04 DIAGNOSIS — R42 Dizziness and giddiness: Secondary | ICD-10-CM | POA: Diagnosis not present

## 2021-01-04 DIAGNOSIS — R001 Bradycardia, unspecified: Secondary | ICD-10-CM

## 2021-01-08 DIAGNOSIS — R42 Dizziness and giddiness: Secondary | ICD-10-CM | POA: Diagnosis not present

## 2021-01-08 DIAGNOSIS — R001 Bradycardia, unspecified: Secondary | ICD-10-CM | POA: Diagnosis not present

## 2021-01-13 ENCOUNTER — Ambulatory Visit (HOSPITAL_COMMUNITY)
Admission: RE | Admit: 2021-01-13 | Discharge: 2021-01-13 | Disposition: A | Discharge status: Home / Self Care | Payer: Medicare Other | Source: Ambulatory Visit | Attending: Cardiology | Admitting: Cardiology

## 2021-01-13 ENCOUNTER — Ambulatory Visit (HOSPITAL_BASED_OUTPATIENT_CLINIC_OR_DEPARTMENT_OTHER)
Admission: RE | Admit: 2021-01-13 | Discharge: 2021-01-13 | Disposition: A | Discharge status: Home / Self Care | Payer: Medicare Other | Source: Ambulatory Visit | Attending: Family Medicine | Admitting: Family Medicine

## 2021-01-13 ENCOUNTER — Other Ambulatory Visit: Payer: Self-pay

## 2021-01-13 ENCOUNTER — Telehealth: Payer: Self-pay | Admitting: *Deleted

## 2021-01-13 ENCOUNTER — Ambulatory Visit (HOSPITAL_COMMUNITY): Payer: Medicare Other

## 2021-01-13 DIAGNOSIS — R9431 Abnormal electrocardiogram [ECG] [EKG]: Secondary | ICD-10-CM

## 2021-01-13 DIAGNOSIS — R42 Dizziness and giddiness: Secondary | ICD-10-CM | POA: Diagnosis not present

## 2021-01-13 DIAGNOSIS — R001 Bradycardia, unspecified: Secondary | ICD-10-CM | POA: Diagnosis not present

## 2021-01-13 DIAGNOSIS — R002 Palpitations: Secondary | ICD-10-CM

## 2021-01-13 LAB — ECHOCARDIOGRAM COMPLETE
Area-P 1/2: 3.21 cm2
S' Lateral: 3.1 cm

## 2021-01-13 NOTE — Telephone Encounter (Signed)
Left message to check her Mychart account or call back for sleep study appointment details.

## 2021-01-22 ENCOUNTER — Encounter: Payer: Medicare Other | Attending: Physical Medicine & Rehabilitation | Admitting: Physical Medicine & Rehabilitation

## 2021-01-22 ENCOUNTER — Other Ambulatory Visit: Payer: Self-pay

## 2021-01-22 ENCOUNTER — Encounter: Payer: Self-pay | Admitting: Physical Medicine & Rehabilitation

## 2021-01-22 VITALS — BP 144/8 | HR 60 | Temp 99.0°F | Ht 64.0 in | Wt 215.0 lb

## 2021-01-22 DIAGNOSIS — M7501 Adhesive capsulitis of right shoulder: Secondary | ICD-10-CM | POA: Diagnosis not present

## 2021-01-22 MED ORDER — MORPHINE SULFATE ER 15 MG PO TBCR: 15.0000 mg | EXTENDED_RELEASE_TABLET | Freq: Three times a day (TID) | ORAL | 0 refills | Status: DC

## 2021-01-22 MED ORDER — MORPHINE SULFATE 15 MG PO TABS: 15.0000 mg | ORAL_TABLET | Freq: Two times a day (BID) | ORAL | 0 refills | Status: DC | PRN

## 2021-01-22 NOTE — Progress Notes (Signed)
Shoulder injectionRIght  Without ultrasound guidance)  Indication:right Shoulder pain not relieved by medication management and other conservative care. Last injection May 2022 lasted 1.5 mo  Informed consent was obtained after describing risks and benefits of the procedure with the patient, this includes bleeding, bruising, infection and medication side effects. The patient wishes to proceed and has given written consent. Patient was placed in a seated position. The Right shoulder was marked and prepped with betadine in the subacromial area. A 25-gauge 1-1/2 inch needle was inserted into the subacromial area. After negative draw back for blood, a solution containing 1 mL of 6 mg per ML betamethasone and 4 mL of 1% lidocaine was injected. A band aid was applied. The patient tolerated the procedure well. Post procedure instructions were given.    F/u NP 1 mo

## 2021-02-05 ENCOUNTER — Encounter (HOSPITAL_BASED_OUTPATIENT_CLINIC_OR_DEPARTMENT_OTHER): Payer: Self-pay | Admitting: Family

## 2021-02-05 ENCOUNTER — Ambulatory Visit (INDEPENDENT_AMBULATORY_CARE_PROVIDER_SITE_OTHER): Payer: Medicare Other | Admitting: Family

## 2021-02-05 ENCOUNTER — Other Ambulatory Visit: Payer: Self-pay

## 2021-02-05 VITALS — BP 140/84 | HR 60 | Ht 64.0 in | Wt 216.4 lb

## 2021-02-05 DIAGNOSIS — R42 Dizziness and giddiness: Secondary | ICD-10-CM

## 2021-02-05 DIAGNOSIS — R001 Bradycardia, unspecified: Secondary | ICD-10-CM

## 2021-02-05 DIAGNOSIS — I471 Supraventricular tachycardia: Secondary | ICD-10-CM | POA: Diagnosis not present

## 2021-02-05 DIAGNOSIS — I10 Essential (primary) hypertension: Secondary | ICD-10-CM | POA: Diagnosis not present

## 2021-02-05 DIAGNOSIS — R0683 Snoring: Secondary | ICD-10-CM

## 2021-02-05 DIAGNOSIS — E782 Mixed hyperlipidemia: Secondary | ICD-10-CM

## 2021-02-05 DIAGNOSIS — R002 Palpitations: Secondary | ICD-10-CM | POA: Diagnosis not present

## 2021-02-05 NOTE — Progress Notes (Signed)
Office Visit    Patient Name: [name] [name] Date of Encounter: 02/05/2021  PCP:  Lyndee Hensen, [facility]  Cardiologist:  Skeet Latch, MD  Advanced Practice Provider:  No care team member to display Electrophysiologist:  None    Chief Complaint    [name] [name] is a 65 y.o. female with a hx of anemia, hyperlipidemia, hypertension, sciatica, palpitations, bradycardia presents today for follow up after ZIO monitor   Past Medical History    Past Medical History:  Diagnosis Date   Anemia    Blood transfusion without reported diagnosis    Crohn's disease ([place])    Disorder of sacroiliac joint    Hyperlipidemia    Hypertension    Ischemic colitis ([place])    Lumbago    Lumbar post-laminectomy syndrome    Lumbosacral neuritis    Lumbosacral radiculitis    Sciatica    Past Surgical History:  Procedure Laterality Date   ABDOMINAL HYSTERECTOMY  1982   DUB; ovaries intact   bladder surg  1992   bladder retraction   BREAST BIOPSY     BREAST REDUCTION SURGERY  1984   CARDIAC CATHETERIZATION  1990's   [name]   COLON SURGERY  05/11/11   [facility] of adhesions Crohn's   MASS EXCISION Left 01/03/2018   Procedure: EXCISION LEFT UPPER BACK LIPOMA ERAS PATH;  Surgeon: Erroll Luna, MD;  Location: [place];  Service: General;  Laterality: Left;   REDUCTION MAMMAPLASTY     SPINE SURGERY     5 total (1 upper, 4 lumbar)   SPINE SURGERY     nerve stimulator on right side    Allergies  Allergies  Allergen Reactions   Orudis [Ketoprofen] Hives, Itching and Rash    onSet: 07/30/1990    History of Present Illness    [name] [name] is a 65 y.o. female with a hx of anemia, hyperlipidemia, hypertension, sciatica, palpitations, bradycardia last seen 01/01/2021 by Dr. Oval Linsey.  Seen in consult by Dr. Tommie Raymond for palpitations and bradycardia.  She was seen by primary care since 7/22 and noted to have heart rate  in the 50s and 60s as well as dizziness.  EKG showed sinus bradycardia and inferior lateral T wave inversion.  Previous echocardiogram May 2021 LVEF 55 to 60% with grade 1 diastolic dysfunction and moderate LVH.  Nuclear stress test December 2017 negative for ischemia.  At clinic visit she noted multiple year history of slow heart rate but that it was overall worsening.  Echocardiogram 01/13/2021 LVEF 55-60%, no R WMA, mild to moderate LVH, grade 1 diastolic dysfunction, trivial MR.  Carotid duplex 01/13/2021 with bilateral carotid arteries with only minimal thickening or plaque.  ZIO monitor 01/08/2021 with baseline sinus rhythm with average heart rate 67 bpm.  No pauses greater than 2.5 seconds.  There were 5 episodes of SVT with heart rate 26 bpm.  Noted occasional PVCs and ventricular bigeminy and trigeminy.  She presents today for follow-up. She is very involved in her church, her nephew is actually the pastor. We reviewed her echo, carotid duplex, and ZIO monitor. Tells me she did forget to press the button while wearing the monitor noted 1 episode of palpitations.  This was overall fleeting and self resolved. She drinks lots of water throughout the day. She has 1 cup of coffee in the morning and an occasional cup of sweet tea. She used to love to power walk but cannot  due to back pain.  Is hopeful to find some exercise that she can do to facilitate weight loss.Checks her blood pressure at home with readings of 137/87, 142/80. Tells me her blood pressure is rarely less than 130/80. She notes persistent lightheadedness. It is notable with changes in position of her neck such as looking up or lying with head turn to the right.  We discussed that this is likely due to cervical disc disease.  EKGs/Labs/Other Studies Reviewed:   The following studies were reviewed today:  Echo 01/13/2021  1. Left ventricular ejection fraction, by estimation, is 55 to 60%. The  left ventricle has normal function. The left  ventricle has no regional  wall motion abnormalities. There is mild-to-moderate concentric left  ventricular hypertrophy. Left ventricular   diastolic parameters are consistent with Grade I diastolic dysfunction  (impaired relaxation).   2. Right ventricular systolic function is normal. The right ventricular  size is normal.   3. The mitral valve is normal in structure. Trivial mitral valve  regurgitation. No evidence of mitral stenosis.   4. The aortic valve was not well visualized. Aortic valve regurgitation  is not visualized. No aortic stenosis is present.   5. The inferior vena cava is normal in size with greater than 50%  respiratory variability, suggesting right atrial pressure of 3 mmHg.   Carotid duplex 01/13/2021 Right Carotid: The extracranial vessels were near-normal with only minimal  wall                thickening or plaque.   Left Carotid: The extracranial vessels were near-normal with only minimal  wall               thickening or plaque.   Vertebrals:  Bilateral vertebral arteries demonstrate antegrade flow.  Subclavians: Normal flow hemodynamics were seen in bilateral subclavian               arteries.   *See table(s) above for measurements and observations.    Long-term monitor 01/08/2021 3 Day Zio Monitor   Quality: Fair.  Baseline artifact. Predominant rhythm: sinus rhythm Average heart rate: 67 bpm Max heart rate: 145 bpm Min heart rate: 42 bpm Pauses >2.5 seconds: none 5 beat episode of SVT.  Heart rate 106 bpm. Occasional PVCs and ventricular bigeminy/trigeminy (<1%)  EKG: No EKG today  Recent Labs: 12/24/2020: Hemoglobin 12.3; Platelets 245; TSH 2.370  Recent Lipid Panel    Component Value Date/Time   CHOL 190 10/06/2020 1439   TRIG 225 (H) 10/06/2020 1439   HDL 39 (L) 10/06/2020 1439   CHOLHDL 4.9 (H) 10/06/2020 1439   CHOLHDL 4.9 03/09/2016 1319   VLDL 51 (H) 03/09/2016 1319   LDLCALC 112 (H) 10/06/2020 1439   LDLDIRECT 107 (H) 10/31/2019  1013   Home Medications   Current Meds  Medication Sig   atorvastatin (LIPITOR) 20 MG tablet TAKE 1 TABLET BY MOUTH EVERY DAY   chlorthalidone (HYGROTON) 25 MG tablet TAKE 1 TABLET (25 MG TOTAL) BY MOUTH DAILY.   fluticasone (FLONASE) 50 MCG/ACT nasal spray SPRAY 2 SPRAYS INTO EACH NOSTRIL EVERY DAY   morphine (MS CONTIN) 15 MG 12 hr tablet Take 1 tablet (15 mg total) by mouth every 8 (eight) hours.   morphine (MSIR) 15 MG tablet Take 1 tablet (15 mg total) by mouth 2 (two) times daily as needed for severe pain.   potassium chloride (KLOR-CON) 20 MEQ packet USE 1 PACKET AS INSTRUCTED ONCE A DAY   tiZANidine (ZANAFLEX) 2 MG  tablet TAKE 1 TABLET (2 MG TOTAL) BY MOUTH 2 (TWO) TIMES DAILY AS NEEDED FOR MUSCLE SPASMS.   topiramate (TOPAMAX) 50 MG tablet TAKE 1 TABLET BY MOUTH EVERYDAY AT BEDTIME     Review of Systems   All other systems reviewed and are otherwise negative except as noted above.  Physical Exam    VS:  BP 140/84 (BP Location: Left Arm, Patient Position: Sitting, Cuff Size: Normal)   Pulse 60   Ht '5\' 4"'$  (1.626 m)   Wt 216 lb 6.4 oz (98.2 kg)   BMI 37.14 kg/m  , BMI Body mass index is 37.14 kg/m.  Wt Readings from Last 3 Encounters:  02/05/21 216 lb 6.4 oz (98.2 kg)  01/22/21 215 lb (97.5 kg)  01/01/21 216 lb 6.4 oz (98.2 kg)    GEN: Well nourished, well developed, in no acute distress. HEENT: normal. Neck: Supple, no JVD, carotid bruits, or masses. Cardiac: RRR, no murmurs, rubs, or gallops. No clubbing, cyanosis, edema.  Radials/PT 2+ and equal bilaterally.  Respiratory:  Respirations regular and unlabored, clear to auscultation bilaterally. GI: Soft, nontender, nondistended. MS: No deformity or atrophy. Skin: Warm and dry, no rash. Neuro:  Strength and sensation are intact. Psych: Normal affect.  Assessment & Plan    Bradycardia / SVT -3-day monitor 01/08/2021 with predominantly normal sinus rhythm with average heart rate of 67 bpm.  Minimum heart rate 42  bpm.  No pauses greater than 2.5 seconds.  One 5 beat episode of SVT with heart rate 106 bpm.  Given baseline low heart rate recommend preventative measures such as avoiding caffeine, deep breathing and will defer addition of AV nodal blocking agent-she is agreeable with this plan.  Minimum sinus rate of 42 bpm was 8:20 AM, anticipate she was asleep.  Sleep study has been arranged, as below as findings are suggestive of sleep apnea.   Hyperlipidemia -continue atorvastatin and 20 mg daily per primary care.  Dizziness / Lightheadedness -management of bradycardia and SVT, as above.  Plan for sleep study.  No significant pauses nor syncope, no indication for EP referral at this time.  Her positions are notable with changes in neck position and torso.  Cervical disc disease is contributory-encouraged to discuss with primary care provider.  Hypertension -BP above goal of 130/80 though given lightheadedness and dizziness will defer escalation in therapy.  Continue chlorthalidone 25 mg daily per primary care provider. Heart healthy diet and regular cardiovascular exercise encouraged.    Snores -appointment with Dr. Claiborne Billings in sleep study upcoming.  Disposition: Follow up in 3 months with Dr. Oval Linsey or APP.  Signed, Loel Dubonnet, NP 02/05/2021, 9:30 AM [place]

## 2021-02-05 NOTE — Patient Instructions (Addendum)
Medication Instructions:  No medication changes today.  *If you need a refill on your cardiac medications before your next appointment, please call your pharmacy*  Lab Work: None ordered today.   Testing/Procedures: None ordered today.    Follow-Up: At [facility], you and your health needs are our priority.  As part of our continuing mission to provide you with exceptional heart care, we have created designated Provider Care Teams.  These Care Teams include your primary Cardiologist (physician) and Advanced Practice Providers (APPs -  Physician Assistants and Nurse Practitioners) who all work together to provide you with the care you need, when you need it.  We recommend signing up for the patient portal called "MyChart".  Sign up information is provided on this After Visit Summary.  MyChart is used to connect with patients for Virtual Visits (Telemedicine).  Patients are able to view lab/test results, encounter notes, upcoming appointments, etc.  Non-urgent messages can be sent to your provider as well.   To learn more about what you can do with MyChart, go to [email or web address].    Your next appointment:   05/20/21 at 0800 with Dr. Oval Linsey   Other Instructions  Pursed Lip Breathing  How to perform pursed lip breathing  Start the exercise by closing your mouth. Breathe in through your nose, taking a normal breath. You can do this at your normal rate of breathing. If you feel you are not getting enough air, breathe in while slowly counting to 2 or 3. Pucker (purse) your lips as if you were going to whistle. Gently tighten the muscles of your abdomenor press on your abdomen to help push the air out. Breathe out slowly through your pursed lips. Take at least twice as long to breathe out as it takes you to breathe in. Make sure that you breathe out all of the air, but do not force air out. Ask your health care provider how often and how long to do this exercise.  Heart  Healthy Diet Recommendations: A low-salt diet is recommended. Meats should be grilled, baked, or boiled. Avoid fried foods. Focus on lean protein sources like fish or chicken with vegetables and fruits. The American Heart Association is a Microbiologist!    To prevent palpitations: Make sure you are adequately hydrated.  Avoid and/or limit caffeine containing beverages like soda or tea. Exercise regularly.  Manage stress well. Some over the counter medications can cause palpitations such as Benadryl, AdvilPM, TylenolPM. Regular Advil or Tylenol do not cause palpitations.    Exercises to do While Sitting  Exercises that you do while sitting (chair exercises) can give you many of the same benefits as full exercise. Benefits include strengthening your heart, burning calories, and keeping muscles and joints healthy. Exercise can also improve your mood and help with depression andanxiety. You may benefit from chair exercises if you are unable to do standing exercises because of: Diabetic foot pain. Obesity. Illness. Arthritis. Recovery from surgery or injury. Breathing problems. Balance problems. Another type of disability. Before starting chair exercises, check with your health care provider or a physical therapist to find out how much exercise you can tolerate and which exercises are safe for you. If your health care provider approves: Start out slowly and build up over time. Aim to work up to about 10-20 minutes for each exercise session. Make exercise part of your daily routine. Drink water when you exercise. Do not wait until you are thirsty. Drink every 10-15 minutes. Stop exercising right away  if you have pain, nausea, shortness of breath, or dizziness. If you are exercising in a wheelchair, make sure to lock the wheels. Ask your health care provider whether you can do tai chi or yoga. Many positions in these mind-body exercises can be modified to do while seated. Warm-up Before  starting other exercises: Sit up as straight as you can. Have your knees bent at 90 degrees, which is the shape of the capital letter "L." Keep your feet flat on the floor. Sit at the front edge of your chair, if you can. Pull in (tighten) the muscles in your abdomen and stretch your spine and neck as straight as you can. Hold this position for a few minutes. Breathe in and out evenly. Try to concentrate on your breathing, and relax your mind. Stretching Exercise A: Arm stretch Hold your arms out straight in front of your body. Bend your hands at the wrist with your fingers pointing up, as if signaling someone to stop. Notice the slight tension in your forearms as you hold the position. Keeping your arms out and your hands bent, rotate your hands outward as far as you can and hold this stretch. Aim to have your thumbs pointing up and your pinkie fingers pointing down. Slowly repeat arm stretches for one minute as tolerated. Exercise B: Leg stretch If you can move your legs, try to "draw" letters on the floor with the toes of your foot. Write your name with one foot. Write your name with the toes of your other foot. Slowly repeat the movements for one minute as tolerated. Exercise C: Reach for the sky Reach your hands as far over your head as you can to stretch your spine. Move your hands and arms as if you are climbing a rope. Slowly repeat the movements for one minute as tolerated. Range of motion exercises Exercise A: Shoulder roll Let your arms hang loosely at your sides. Lift just your shoulders up toward your ears, then let them relax back down. When your shoulders feel loose, rotate your shoulders in backward and forward circles. Do shoulder rolls slowly for one minute as tolerated. Exercise B: March in place As if you are marching, pump your arms and lift your legs up and down. Lift your knees as high as you can. If you are unable to lift your knees, just pump your arms and move  your ankles and feet up and down. March in place for one minute as tolerated. Exercise C: Seated jumping jacks Let your arms hang down straight. Keeping your arms straight, lift them up over your head. Aim to point your fingers to the ceiling. While you lift your arms, straighten your legs and slide your heels along the floor to your sides, as wide as you can. As you bring your arms back down to your sides, slide your legs back together. If you are unable to use your legs, just move your arms. Slowly repeat seated jumping jacks for one minute as tolerated. Strengthening exercises Exercise A: Shoulder squeeze Hold your arms straight out from your body to your sides, with your elbows bent and your fists pointed at the ceiling. Keeping your arms in the bent position, move them forward so your elbows and forearms meet in front of your face. Open your arms back out as wide as you can with your elbows still bent, until you feel your shoulder blades squeezing together. Hold for 5 seconds. Slowly repeat the movements forward and backward for one minute as  tolerated. Contact a health care provider if you: Had to stop exercising due to any of the following: Pain. Nausea. Shortness of breath. Dizziness. Fatigue. Have significant pain or soreness after exercising. Get help right away if you have: Chest pain. Difficulty breathing. These symptoms may represent a serious problem that is an emergency. Do not wait to see if the symptoms will go away. Get medical help right away. Call your local emergency services (911 in the [place] Do not drive yourself to the hospital. This information is not intended to replace advice given to you by your health care provider. Make sure you discuss any questions you have with your healthcare provider. Document Revised: 09/16/2019 Document Reviewed: 10/03/2019 Elsevier Patient Education  2022 [employer].

## 2021-02-09 ENCOUNTER — Telehealth: Payer: Self-pay

## 2021-02-09 NOTE — Telephone Encounter (Signed)
LVM to have pt call back to schedule AWV.   RE: confirm insurance and schedule AWV on my schedule if times are convenient for patient or other AWV schedule as template permits.   

## 2021-02-11 ENCOUNTER — Telehealth: Payer: Self-pay | Admitting: *Deleted

## 2021-02-11 NOTE — Telephone Encounter (Signed)
[name] [name] from [facility] would like a call back. She has some questions about some of [name] medications.  [phone] x [phone].  Unable to reach her, number must be incorrect. She will have to call back.

## 2021-02-19 ENCOUNTER — Other Ambulatory Visit: Payer: Self-pay

## 2021-02-19 ENCOUNTER — Encounter: Payer: Self-pay | Admitting: Registered Nurse

## 2021-02-19 ENCOUNTER — Encounter: Payer: Medicare Other | Attending: Physical Medicine & Rehabilitation | Admitting: Registered Nurse

## 2021-02-19 VITALS — BP 122/69 | HR 58 | Temp 98.2°F | Ht 64.0 in | Wt 211.2 lb

## 2021-02-19 DIAGNOSIS — M4807 Spinal stenosis, lumbosacral region: Secondary | ICD-10-CM

## 2021-02-19 DIAGNOSIS — Z79891 Long term (current) use of opiate analgesic: Secondary | ICD-10-CM

## 2021-02-19 DIAGNOSIS — G894 Chronic pain syndrome: Secondary | ICD-10-CM | POA: Diagnosis not present

## 2021-02-19 DIAGNOSIS — M961 Postlaminectomy syndrome, not elsewhere classified: Secondary | ICD-10-CM | POA: Diagnosis not present

## 2021-02-19 DIAGNOSIS — M2550 Pain in unspecified joint: Secondary | ICD-10-CM

## 2021-02-19 DIAGNOSIS — M62838 Other muscle spasm: Secondary | ICD-10-CM

## 2021-02-19 DIAGNOSIS — M7501 Adhesive capsulitis of right shoulder: Secondary | ICD-10-CM

## 2021-02-19 DIAGNOSIS — Z5181 Encounter for therapeutic drug level monitoring: Secondary | ICD-10-CM

## 2021-02-19 DIAGNOSIS — R001 Bradycardia, unspecified: Secondary | ICD-10-CM

## 2021-02-19 MED ORDER — MORPHINE SULFATE 15 MG PO TABS: 15.0000 mg | ORAL_TABLET | Freq: Two times a day (BID) | ORAL | 0 refills | Status: DC | PRN

## 2021-02-19 MED ORDER — MORPHINE SULFATE ER 15 MG PO TBCR: 15.0000 mg | EXTENDED_RELEASE_TABLET | Freq: Three times a day (TID) | ORAL | 0 refills | Status: DC

## 2021-02-19 NOTE — Progress Notes (Signed)
Subjective:    Patient ID: [name] [name], female    DOB: 1956-06-03, 65 y.o.   MRN: [id number]  HPI: ROBECCA [name] is a 65 y.o. female who returns for follow up appointment for chronic pain and medication refill. She states her pain is located in her lower back. She rates her pain 4. Her current exercise regime is walking and performing stretching exercises.  [name] reports she had multiple deaths in her family, emotional support given.    [name] Morphine equivalent is 75.00 MME.   UDS ordered today.    Pain Inventory Average Pain 4 Pain Right Now 4 My pain is constant, sharp, and aching  In the last 24 hours, has pain interfered with the following? General activity 4 Relation with others 4 Enjoyment of life 4 What TIME of day is your pain at its worst? morning , daytime, and night Sleep (in general) Poor  Pain is worse with: walking, bending, inactivity, and standing Pain improves with: rest, heat/ice, and medication Relief from Meds: 0  Family History  Problem Relation Age of Onset   Hyperlipidemia Mother    Heart attack Mother    Heart disease Mother 15       CHF; CAD   Hypertension Mother    Heart failure Mother    Diabetes Sister    Lupus Sister    Kidney failure Sister    Hyperlipidemia Sister    Hypertension Sister    Hypertension Brother    Stroke Maternal Aunt    Colon cancer Neg Hx    Rectal cancer Neg Hx    Social History   Socioeconomic History   Marital status: Widowed    Spouse name: Not on file   Number of children: 3   Years of education: Not on file   Highest education level: Not on file  Occupational History   Occupation: disability    Comment: 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  Tobacco Use   Smoking status: Former    Packs/day: 0.50    Years: 20.00    Pack years: 10.00    Types: Cigarettes    Quit date: 07/21/2008    Years since quitting: 12.5   Smokeless tobacco: Never  Vaping Use   Vaping Use: Never used  Substance and Sexual  Activity   Alcohol use: No   Drug use: No   Sexual activity: Not on file  Other Topics Concern   Not on file  Social History Narrative   Marital status: widowed since 2002; not dating which is bad in 2018      Children: 3 children (45, 2, 40); 13 grandchildren; 1 gg      Employment: disability since 2009; retail      Lives: alone in [place] in apartment      Tobacco: quit in 2011      Alcohol: none      Drugs: none      Exercise: minimal in 2018      ADLs: independent with ADLs; drives      Seatbelt: 100%; no texting.   Social Determinants of Health   Financial Resource Strain: Not on file  Food Insecurity: Not on file  Transportation Needs: Not on file  Physical Activity: Not on file  Stress: Not on file  Social Connections: Not on file   Past Surgical History:  Procedure Laterality Date   ABDOMINAL HYSTERECTOMY  1982   DUB; ovaries intact   bladder surg  1992   bladder retraction  BREAST BIOPSY     BREAST REDUCTION SURGERY  1984   CARDIAC CATHETERIZATION  1990's   [name] [name]   COLON SURGERY  05/11/11   [facility] L[facility] of adhesions Crohn's   MASS EXCISION Left 01/03/2018   Procedure: EXCISION LEFT UPPER BACK LIPOMA ERAS PATH;  Surgeon: Erroll Luna, MD;  Location: [facility];  Service: General;  Laterality: Left;   REDUCTION MAMMAPLASTY     SPINE SURGERY     5 total (1 upper, 4 lumbar)   SPINE SURGERY     nerve stimulator on right side   Past Surgical History:  Procedure Laterality Date   ABDOMINAL HYSTERECTOMY  1982   DUB; ovaries intact   bladder surg  1992   bladder retraction   BREAST BIOPSY     BREAST REDUCTION SURGERY  1984   CARDIAC CATHETERIZATION  1990's   [name] [name]   COLON SURGERY  05/11/11   [facility] Lysis of adhesions Crohn's   MASS EXCISION Left 01/03/2018   Procedure: EXCISION LEFT UPPER BACK LIPOMA ERAS PATH;  Surgeon: Erroll Luna, MD;  Location: [facility];  Service: General;  Laterality: Left;   REDUCTION  MAMMAPLASTY     SPINE SURGERY     5 total (1 upper, 4 lumbar)   SPINE SURGERY     nerve stimulator on right side   Past Medical History:  Diagnosis Date   Anemia    Blood transfusion without reported diagnosis    Crohn's disease ([place])    Disorder of sacroiliac joint    Hyperlipidemia    Hypertension    Ischemic colitis ([place])    Lumbago    Lumbar post-laminectomy syndrome    Lumbosacral neuritis    Lumbosacral radiculitis    Sciatica    BP 122/69   Pulse (!) 54   Temp 98.2 F (36.8 C)   Ht '5\' 4"'$  (1.626 m)   Wt 211 lb 3.2 oz (95.8 kg)   SpO2 95%   BMI 36.25 kg/m   Opioid Risk Score:   Fall Risk Score:  `1  Depression screen PHQ 2/9  Depression screen [facility] 2/9 12/24/2020 12/22/2020 11/26/2020 10/27/2020 10/06/2020 09/30/2020 08/03/2020  Decreased Interest 2 0 0 0 0 0 0  Down, Depressed, Hopeless 0 0 0 0 0 0 0  PHQ - 2 Score 2 0 0 0 0 0 0  Altered sleeping 1 - - - 1 - -  Tired, decreased energy 3 - - - 0 - -  Change in appetite 3 - - - 1 - -  Feeling bad or failure about yourself  0 - - - 0 - -  Trouble concentrating 0 - - - 0 - -  Moving slowly or fidgety/restless 0 - - - 0 - -  Suicidal thoughts 0 - - - 0 - -  PHQ-9 Score 9 - - - 2 - -  Difficult doing work/chores - - - - Not difficult at all - -  Some recent data might be hidden    Review of Systems  Musculoskeletal:  Positive for back pain.       RIGHT SHOULDER PAIN  All other systems reviewed and are negative.     Objective:   Physical Exam Vitals and nursing note reviewed.  Constitutional:      Appearance: Normal appearance.  Cardiovascular:     Rate and Rhythm: Normal rate and regular rhythm.     Pulses: Normal pulses.     Heart sounds: Normal heart  sounds.  Pulmonary:     Effort: Pulmonary effort is normal.     Breath sounds: Normal breath sounds.  Musculoskeletal:     Cervical back: Normal range of motion and neck supple.     Comments: Normal Muscle Bulk and Muscle Testing Reveals:  Upper Extremities:  Full ROM and Muscle Strength  5/5 ,Lumbar Paraspinal Tender ness: L-3-L-5 Lower Extremities: Full ROM and Muscle Strength 5/5 Arises from Table slowly Narrow Based Gait     Skin:    General: Skin is warm and dry.  Neurological:     Mental Status: She is alert and oriented to person, place, and time.  Psychiatric:        Mood and Affect: Mood normal.        Behavior: Behavior normal.         Assessment & Plan:  1.Lumbar Postlaminectomy/ Spinal Stenosis Lumbar Region/ Post-Op Pain/Low back pain with radiating symptoms in L3-4 distribution:  continues to be limited by pain. Refilled:  MSIR 15 mg one tablet twice a day as needed for moderate to sever pain #60 and  Morphine Sulfate ER 15 mg Q 8 hours.Second script e-scribed to accommodate scheduled appointment. 02/19/2021. We will continue the opioid monitoring program, this consists of regular clinic visits, examinations, urine drug screen, pill counts as well as use of [place] Controlled Substance Reporting system. A 12 month History has been reviewed on the [place] Controlled Substance Reporting System on 02/19/2021. Encouraged to Continue exercise regime.   2. Lumbar Radiculopathy: No complaints today. Continue Topamax:.Continue to Monitor 02/19/2021 3.Chronic  Right Knee Pain: No complaints today.  Continue with Ice Therapy and  Voltaren Gel. 02/19/2021 4. Muscle Spasm: Continue current treatment: Tizanidine. 02/19/2021  5. Lipoma: S/P Left  Lipoma Excision of upper back  By Dr. Brantley Stage on 01/03/2018. Surgery Following. Continue to Monitor. 02/19/2021.   6. Paresthesia of Skin: Continue Topamax. Continue to monitor. 02/19/2021 7. Adhesive Capsulitis of Right Shoulder:Right Shoulder Tendonitis Pain:  S/P Right Shoulder Cortisone Injection with Dr Letta Pate, with good relief noted. Continue to monitor. Continue HEP as Tolerated. 02/19/2021  8. Polyarthralgia: Continue HEP as Tolerated. Continue to Monitor.02/19/2021 9.  Bradycardia: Apical Pulse check: Ms. Jubb reports she has followed up with her Cardiologist. Cardiologist following.F/U in 1 month

## 2021-02-23 ENCOUNTER — Ambulatory Visit: Payer: Medicare Other | Admitting: Registered Nurse

## 2021-02-25 ENCOUNTER — Telehealth: Payer: Self-pay | Admitting: *Deleted

## 2021-02-25 LAB — TOXASSURE SELECT,+ANTIDEPR,UR

## 2021-02-25 NOTE — Telephone Encounter (Signed)
Urine drug screen for this encounter is consistent for prescribed medication 

## 2021-03-17 ENCOUNTER — Other Ambulatory Visit: Payer: Self-pay

## 2021-03-17 ENCOUNTER — Encounter: Payer: Self-pay | Admitting: Family Medicine

## 2021-03-17 ENCOUNTER — Ambulatory Visit (INDEPENDENT_AMBULATORY_CARE_PROVIDER_SITE_OTHER): Payer: Medicare Other | Admitting: Family Medicine

## 2021-03-17 VITALS — BP 120/74 | HR 60 | Ht 64.0 in | Wt 206.8 lb

## 2021-03-17 DIAGNOSIS — M542 Cervicalgia: Secondary | ICD-10-CM | POA: Diagnosis not present

## 2021-03-17 DIAGNOSIS — G894 Chronic pain syndrome: Secondary | ICD-10-CM | POA: Diagnosis not present

## 2021-03-17 DIAGNOSIS — R7303 Prediabetes: Secondary | ICD-10-CM | POA: Diagnosis not present

## 2021-03-17 DIAGNOSIS — Z23 Encounter for immunization: Secondary | ICD-10-CM | POA: Diagnosis not present

## 2021-03-17 LAB — POCT GLYCOSYLATED HEMOGLOBIN (HGB A1C): HbA1c, POC (controlled diabetic range): 5.9 % (ref 0.0–7.0)

## 2021-03-17 MED ORDER — DICLOFENAC SOD-LIDOCAINE HCL 1-4.5 % EX GEL: 1.0000 "application " | Freq: Four times a day (QID) | CUTANEOUS | 1 refills | Status: DC | PRN

## 2021-03-17 NOTE — Progress Notes (Signed)
   SUBJECTIVE:   CHIEF COMPLAINT / HPI:   Chief Complaint  Patient presents with   Pain Management     [name] [name] is a 65 y.o. female here to discuss pain management.  Patient follows with a pain clinic and recently told she can no longer use Voltaren gel as it was not covered her insurance.  She check with the pharmacy and they stated it was covered.  Patient would like to restart this medication.  She has scheduled an appointment with a new pain clinic and request her records be sent to the new clinic that is closer to her home.  Continues to have chronic neck pain.  States that her report from her recent ultrasound of her neck was normal.  States that the provider told her to follow-up with my office regarding her pain.  Patient notes she had a neck surgery in 2009 by Dr. Lorin Mercy.  It feels like a pressure and a throbbing sensation.  Pain is worse with extension.   PERTINENT  PMH / PSH: reviewed and updated as appropriate   OBJECTIVE:   BP 120/74   Pulse 60   Ht 5\' 4"  (1.626 m)   Wt 206 lb 12.8 oz (93.8 kg)   SpO2 100%   BMI 35.50 kg/m    GEN: well appearing female in no acute distress  NECK: limited rotation, pain with extension, no anterior or posterior midline tenderness, focal tenderness right lateral paraspinal muscles, no appreciable hypertrophy CVS: well perfused , no carotid bruit  RESP: speaking in full sentences without pause, no respiratory distress  SKIN: warm, dry   ASSESSMENT/PLAN:   Chronic pain Follows with PM&R. Hx of lumbar laminectomy. Pt taking Morphine IR and ER. PDMP reviewed. Defer opiate management to PM&R/pain clinic. It is reasonable to restart diclofenac gel as it has been beneficial in the past. Rx diclofenac-lidocaine sent to pharmacy. Records release completed for pain clinic, per pt request.   Cervical pain Uncontrolled. Hx of spinal fusion in 2009 by Dr. Standley Dakins. Recent TSH and carotid dopplers normal. Considered imaging but would  not likely change management at this time. See chronic pain above for pain regimen. Referral to orthopedic surgery placed.   Pre-diabetes Well controlled with diet. A1c today 5.9 previously 6.7.  Patient actively trying to lose weight.  Has lost 5 pounds since last visit.  Encouraged continued diet rich in vegetables and complex carbs.     Lyndee Hensen, DO PGY-3, [facility] 03/18/2021

## 2021-03-17 NOTE — Patient Instructions (Signed)
It was great seeing you today!  Please check-out at the front desk before leaving the clinic. I'd like to see you back in 3 months  but if you need to be seen earlier than that for any new issues we're happy to fit you in, just give [place] a call!  Visit Remembers: - Stop by the pharmacy to pick up your prescriptions  - Continue to work on your healthy eating habits and incorporating exercise into your daily life.  - Your goal is to have an BP < 120/80 - Medicine Changes: Restart Voltaren gel  - A referral to orthopedic surgery (Dr. Lorin Mercy office) was placed be call us in 2 weeks if you have not been contacted about an appointment.   Please bring all of your medications with you to each visit.    If you haven't already, sign up for My Chart to have easy access to your labs results, and communication with your primary care physician.  Feel free to call with any questions or concerns at any time, at [phone].   Take care,  Dr. Rushie [facility]

## 2021-03-18 ENCOUNTER — Encounter: Payer: Self-pay | Admitting: Family Medicine

## 2021-03-18 ENCOUNTER — Other Ambulatory Visit: Payer: Self-pay

## 2021-03-18 DIAGNOSIS — M542 Cervicalgia: Secondary | ICD-10-CM

## 2021-03-18 NOTE — Assessment & Plan Note (Addendum)
Uncontrolled. Hx of spinal fusion in 2009 by Dr. Standley Dakins. Recent TSH and carotid dopplers normal. Considered imaging but would not likely change management at this time. See chronic pain above for pain regimen. Referral to orthopedic surgery placed.

## 2021-03-18 NOTE — Assessment & Plan Note (Signed)
Well controlled with diet. A1c today 5.9 previously 6.7.  Patient actively trying to lose weight.  Has lost 5 pounds since last visit.  Encouraged continued diet rich in vegetables and complex carbs.

## 2021-03-18 NOTE — Telephone Encounter (Signed)
Received a call from [facility] stating that they receive a ROI from our office but it is not needed.  They are in Epic so provider can access notes that way.  I tried to call patient and get clarification but had to leave a message.  Aiman Sonn,CMA

## 2021-03-18 NOTE — Assessment & Plan Note (Addendum)
Follows with PM&R. Hx of lumbar laminectomy. Pt taking Morphine IR and ER. PDMP reviewed. Defer opiate management to PM&R/pain clinic. It is reasonable to restart diclofenac gel as it has been beneficial in the past. Rx diclofenac-lidocaine sent to pharmacy. Records release completed for pain clinic, per pt request.

## 2021-03-19 ENCOUNTER — Telehealth: Payer: Self-pay

## 2021-03-19 MED ORDER — DICLOFENAC SOD-LIDOCAINE HCL 1-4.5 % EX GEL: 1.0000 "application " | Freq: Four times a day (QID) | CUTANEOUS | 1 refills | Status: DC | PRN

## 2021-03-19 NOTE — Telephone Encounter (Signed)
Received phone call from pharmacist regarding medication. Pharmacist reports that this is not a retail item and that it needs to be sent to compounding pharmacy.   Contacted patient. Patient states that she is fine with rx being sent to [facility].   Will resend rx to [facility].   Talbot Grumbling, RN

## 2021-03-19 NOTE — Addendum Note (Signed)
Addended by: Talbot Grumbling on: 03/19/2021 09:14 AM   Modules accepted: Orders

## 2021-03-19 NOTE — Telephone Encounter (Signed)
Patient calls nurse line regarding diclofenac sodium-lidocaine gel. Patient reports that [facility] does not take insurance and she is unable to afford medication out of pocket.   Patient is requesting that rx be sent to local pharmacy in [place]. As this medication is not a retail item, this cannot be filled at retail pharmacy. Please advise of alternative for medication.   Talbot Grumbling, RN

## 2021-03-22 ENCOUNTER — Encounter: Payer: Medicare Other | Admitting: Registered Nurse

## 2021-03-22 MED ORDER — DICLOFENAC SODIUM 3 % EX GEL: 1.0000 "application " | Freq: Four times a day (QID) | CUTANEOUS | 2 refills | Status: DC | PRN

## 2021-03-22 NOTE — Progress Notes (Deleted)
Subjective:    Patient ID: [name] [name], female    DOB: 1956-02-04, 65 y.o.   MRN: [id number]  HPI Pain Inventory Average Pain 4 Pain Right Now 4 My pain is constant, sharp, and aching  In the last 24 hours, has pain interfered with the following? General activity 4 Relation with others 4 Enjoyment of life 4 What TIME of day is your pain at its worst? morning , daytime, and night Sleep (in general) Poor  Pain is worse with: walking, bending, inactivity, and standing Pain improves with: rest, heat/ice, and medication Relief from Meds: 0  Family History  Problem Relation Age of Onset  . Hyperlipidemia Mother   . Heart attack Mother   . Heart disease Mother 22       CHF; CAD  . Hypertension Mother   . Heart failure Mother   . Diabetes Sister   . Lupus Sister   . Kidney failure Sister   . Hyperlipidemia Sister   . Hypertension Sister   . Hypertension Brother   . Stroke Maternal Aunt   . Colon cancer Neg Hx   . Rectal cancer Neg Hx    Social History   Socioeconomic History  . Marital status: Widowed    Spouse name: Not on file  . Number of children: 3  . Years of education: Not on file  . Highest education level: Not on file  Occupational History  . 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 Smoking status: Former    Packs/day: 0.50    Years: 20.00    Pack years: 10.00    Types: Cigarettes    Quit date: 07/21/2008    Years since quitting: 12.6  . Smokeless tobacco: Never  Vaping Use  . Vaping Use: Never used  Substance and Sexual Activity  . Alcohol use: No  . Drug use: No  . Sexual activity: Not on file  Other Topics Concern  . Not on file  Social History Narrative   Marital status: widowed since 2002; not dating which is bad in 2018      Children: 3 children (45, 67, 56); 13 grandchildren; 1 gg      Employment: disability since 2009; retail      Lives: alone in [place] in apartment      Tobacco: quit in 2011       Alcohol: none      Drugs: none      Exercise: minimal in 2018      ADLs: independent with ADLs; drives      Seatbelt: 100%; no texting.   Social Determinants of Health   Financial Resource Strain: Not on file  Food Insecurity: Not on file  Transportation Needs: Not on file  Physical Activity: Not on file  Stress: Not on file  Social Connections: Not on file   Past Surgical History:  Procedure Laterality Date  . ABDOMINAL HYSTERECTOMY  1982   DUB; ovaries intact  . bladder surg  1992   bladder retraction  . BREAST BIOPSY    . BREAST REDUCTION SURGERY  1984  . CARDIAC CATHETERIZATION  1990's   [name]  . COLON SURGERY  05/11/11   [facility] Lysis of adhesions Crohn's  . MASS EXCISION Left 01/03/2018   Procedure: EXCISION LEFT UPPER BACK LIPOMA ERAS PATH;  Surgeon: Erroll Luna, MD;  Location: [facility];  Service: General;  Laterality: Left;  . REDUCTION MAMMAPLASTY    .  SPINE SURGERY     5 total (1 upper, 4 lumbar)  . SPINE SURGERY     nerve stimulator on right side   Past Surgical History:  Procedure Laterality Date  . ABDOMINAL HYSTERECTOMY  1982   DUB; ovaries intact  . bladder surg  1992   bladder retraction  . BREAST BIOPSY    . BREAST REDUCTION SURGERY  1984  . CARDIAC CATHETERIZATION  1990's   [name]  . COLON SURGERY  05/11/11   [facility] Lysis of adhesions Crohn's  . MASS EXCISION Left 01/03/2018   Procedure: EXCISION LEFT UPPER BACK LIPOMA ERAS PATH;  Surgeon: Erroll Luna, MD;  Location: [facility];  Service: General;  Laterality: Left;  . REDUCTION MAMMAPLASTY    . SPINE SURGERY     5 total (1 upper, 4 lumbar)  . SPINE SURGERY     nerve stimulator on right side   Past Medical History:  Diagnosis Date  . Anemia   . Blood transfusion without reported diagnosis   . Crohn's disease ([place])   . Disorder of sacroiliac joint   . Hyperlipidemia   . Hypertension   . Ischemic colitis ([place])   . Lumbago   . Lumbar  post-laminectomy syndrome   . Lumbosacral neuritis   . Lumbosacral radiculitis   . Sciatica    There were no vitals taken for this visit.  Opioid Risk Score:   Fall Risk Score:  `1  Depression screen PHQ 2/9  Depression screen [facility] 2/9 03/17/2021 02/19/2021 12/24/2020 12/22/2020 11/26/2020 10/27/2020 10/06/2020  Decreased Interest 1 0 2 0 0 0 0  Down, Depressed, Hopeless 0 0 0 0 0 0 0  PHQ - 2 Score 1 0 2 0 0 0 0  Altered sleeping 0 - 1 - - - 1  Tired, decreased energy 2 - 3 - - - 0  Change in appetite 3 - 3 - - - 1  Feeling bad or failure about yourself  0 - 0 - - - 0  Trouble concentrating 0 - 0 - - - 0  Moving slowly or fidgety/restless 0 - 0 - - - 0  Suicidal thoughts 0 - 0 - - - 0  PHQ-9 Score 6 - 9 - - - 2  Difficult doing work/chores Not difficult at all - - - - - Not difficult at all  Some recent data might be hidden      Review of Systems  Musculoskeletal:  Positive for back pain.       Right shoulder pain  All other systems reviewed and are negative.     Objective:   Physical Exam        Assessment & Plan:

## 2021-03-22 NOTE — Telephone Encounter (Signed)
Called patient to inform of below. Patient is also asking about her release of pain clinic records. Informed patient of information provided in previous mychart encounter. Patient was very frustrated and states that she missed an appointment today at the [facility] pain clinic and that she will now owe $50. Apologized for the confusion regarding the release of medical information and advised patient to reach out to the [facility] medical center directly to facilitate release of medical information.   Patient verbalizes understanding.   Talbot Grumbling, RN

## 2021-03-23 ENCOUNTER — Encounter: Payer: Medicare Other | Attending: Physical Medicine & Rehabilitation | Admitting: Registered Nurse

## 2021-03-23 ENCOUNTER — Other Ambulatory Visit: Payer: Self-pay

## 2021-03-23 ENCOUNTER — Encounter: Payer: Self-pay | Admitting: Registered Nurse

## 2021-03-23 VITALS — BP 135/84 | HR 56 | Temp 98.9°F | Ht 64.0 in | Wt 212.0 lb

## 2021-03-23 DIAGNOSIS — M961 Postlaminectomy syndrome, not elsewhere classified: Secondary | ICD-10-CM | POA: Diagnosis not present

## 2021-03-23 DIAGNOSIS — M62838 Other muscle spasm: Secondary | ICD-10-CM

## 2021-03-23 DIAGNOSIS — Z5181 Encounter for therapeutic drug level monitoring: Secondary | ICD-10-CM | POA: Diagnosis not present

## 2021-03-23 DIAGNOSIS — Z79891 Long term (current) use of opiate analgesic: Secondary | ICD-10-CM | POA: Diagnosis not present

## 2021-03-23 DIAGNOSIS — R001 Bradycardia, unspecified: Secondary | ICD-10-CM | POA: Diagnosis not present

## 2021-03-23 DIAGNOSIS — M4807 Spinal stenosis, lumbosacral region: Secondary | ICD-10-CM | POA: Diagnosis not present

## 2021-03-23 DIAGNOSIS — G894 Chronic pain syndrome: Secondary | ICD-10-CM | POA: Diagnosis not present

## 2021-03-23 MED ORDER — MORPHINE SULFATE 15 MG PO TABS: 15.0000 mg | ORAL_TABLET | Freq: Two times a day (BID) | ORAL | 0 refills | Status: DC | PRN

## 2021-03-23 MED ORDER — MORPHINE SULFATE ER 15 MG PO TBCR: 15.0000 mg | EXTENDED_RELEASE_TABLET | Freq: Three times a day (TID) | ORAL | 0 refills | Status: DC

## 2021-03-23 NOTE — Progress Notes (Signed)
Subjective:    Patient ID: [name] [name], female    DOB: [birth year], 65 y.o.   MRN: [id number]  HPI: [name] [name] is a 65 y.o. female who returns for follow up appointment for chronic pain and medication refill. She states her pain is located in her lower back. She rates her pain 4. Her current exercise regime is walking and performing stretching exercises.  [name] [name] Morphine equivalent is 70.00 MME.  Last UDS was Performed on 02/19/2021, it was consistent.       Pain Inventory Average Pain 4 Pain Right Now 4 My pain is constant, sharp, and aching  In the last 24 hours, has pain interfered with the following? General activity 6 Relation with others 6 Enjoyment of life 7 What TIME of day is your pain at its worst? morning , daytime, evening, and night Sleep (in general) Poor  Pain is worse with: walking, sitting, inactivity, and standing Pain improves with: rest, heat/ice, and medication Relief from Meds: 1  Family History  Problem Relation Age of Onset   Hyperlipidemia Mother    Heart attack Mother    Heart disease Mother 6       CHF; CAD   Hypertension Mother    Heart failure Mother    Diabetes Sister    Lupus Sister    Kidney failure Sister    Hyperlipidemia Sister    Hypertension Sister    Hypertension Brother    Stroke Maternal Aunt    Colon cancer Neg Hx    Rectal cancer Neg Hx    Social History   Socioeconomic History   Marital status: Widowed    Spouse name: Not on file   Number of children: 3   Years of education: Not on file   Highest education level: Not on file  Occupational History   Occupation: disability    Comment: 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  Tobacco Use   Smoking status: Former    Packs/day: 0.50    Years: 20.00    Pack years: 10.00    Types: Cigarettes    Quit date: 07/21/2008    Years since quitting: 12.6   Smokeless tobacco: Never  Vaping Use   Vaping Use: Never used  Substance and Sexual Activity   Alcohol use: No   Drug  use: No   Sexual activity: Not on file  Other Topics Concern   Not on file  Social History Narrative   Marital status: widowed since 2002; not dating which is bad in 2018      Children: 3 children (45, 63, 40); 13 grandchildren; 1 gg      Employment: disability since 2009; retail      Lives: alone in [place] in apartment      Tobacco: quit in 2011      Alcohol: none      Drugs: none      Exercise: minimal in 2018      ADLs: independent with ADLs; drives      Seatbelt: 100%; no texting.   Social Determinants of Health   Financial Resource Strain: Not on file  Food Insecurity: Not on file  Transportation Needs: Not on file  Physical Activity: Not on file  Stress: Not on file  Social Connections: Not on file   Past Surgical History:  Procedure Laterality Date   ABDOMINAL HYSTERECTOMY  1982   DUB; ovaries intact   bladder surg  1992   bladder retraction   BREAST BIOPSY  BREAST REDUCTION SURGERY  1984   CARDIAC CATHETERIZATION  [place]   COLON SURGERY  05/11/11   [facility] Lysis of adhesions Crohn's   MASS EXCISION Left 01/03/2018   Procedure: EXCISION LEFT UPPER BACK LIPOMA ERAS PATH;  Surgeon: Erroll Luna, MD;  Location: [place];  Service: General;  Laterality: Left;   REDUCTION MAMMAPLASTY     SPINE SURGERY     5 total (1 upper, 4 lumbar)   SPINE SURGERY     nerve stimulator on right side   Past Surgical History:  Procedure Laterality Date   ABDOMINAL HYSTERECTOMY  1982   DUB; ovaries intact   bladder surg  1992   bladder retraction   BREAST BIOPSY     BREAST REDUCTION SURGERY  1984   CARDIAC CATHETERIZATION  1990's   [name] [name]   COLON SURGERY  05/11/11   [facility] Lysis of adhesions Crohn's   MASS EXCISION Left 01/03/2018   Procedure: EXCISION LEFT UPPER BACK LIPOMA ERAS PATH;  Surgeon: Erroll Luna, MD;  Location: [place];  Service: General;  Laterality: Left;   REDUCTION MAMMAPLASTY     SPINE SURGERY      5 total (1 upper, 4 lumbar)   SPINE SURGERY     nerve stimulator on right side   Past Medical History:  Diagnosis Date   Anemia    Blood transfusion without reported diagnosis    Crohn's disease ([place])    Disorder of sacroiliac joint    Hyperlipidemia    Hypertension    Ischemic colitis ([place])    Lumbago    Lumbar post-laminectomy syndrome    Lumbosacral neuritis    Lumbosacral radiculitis    Sciatica    BP 135/84   Pulse (!) 54   Temp 98.9 F (37.2 C)   Ht 5\' 4"  (1.626 m)   Wt 212 lb (96.2 kg)   SpO2 96%   BMI 36.39 kg/m   Opioid Risk Score:   Fall Risk Score:  `1  Depression screen PHQ 2/9  Depression screen [facility] 2/9 03/17/2021 02/19/2021 12/24/2020 12/22/2020 11/26/2020 10/27/2020 10/06/2020  Decreased Interest 1 0 2 0 0 0 0  Down, Depressed, Hopeless 0 0 0 0 0 0 0  PHQ - 2 Score 1 0 2 0 0 0 0  Altered sleeping 0 - 1 - - - 1  Tired, decreased energy 2 - 3 - - - 0  Change in appetite 3 - 3 - - - 1  Feeling bad or failure about yourself  0 - 0 - - - 0  Trouble concentrating 0 - 0 - - - 0  Moving slowly or fidgety/restless 0 - 0 - - - 0  Suicidal thoughts 0 - 0 - - - 0  PHQ-9 Score 6 - 9 - - - 2  Difficult doing work/chores Not difficult at all - - - - - Not difficult at all  Some recent data might be hidden    Review of Systems  Musculoskeletal:  Positive for back pain.       Right shoulder pain  All other systems reviewed and are negative.     Objective:   Physical Exam Vitals and nursing note reviewed.  Constitutional:      Appearance: Normal appearance.  Cardiovascular:     Rate and Rhythm: Normal rate and regular rhythm.     Pulses: Normal pulses.     Heart sounds: Normal heart sounds.  Pulmonary:  Effort: Pulmonary effort is normal.     Breath sounds: Normal breath sounds.  Musculoskeletal:     Cervical back: Normal range of motion and neck supple.     Comments: Normal Muscle Bulk and Muscle Testing Reveals:  Upper Extremities: Full ROM and Muscle  Strength  5/5  Lumbar Paraspinal Tenderness: L-4-L-5 Lower Extremities: Full ROM and Muscle Strength 5/5 Arises from Table slowly  Narrow Based  Gait     Skin:    General: Skin is warm and dry.  Neurological:     Mental Status: She is alert and oriented to person, place, and time.  Psychiatric:        Mood and Affect: Mood normal.        Behavior: Behavior normal.         Assessment & Plan:  1.Lumbar Postlaminectomy/ Spinal Stenosis Lumbar Region/ Post-Op Pain/Low back pain with radiating symptoms in L3-4 distribution:  continues to be limited by pain. Refilled:  MSIR 15 mg one tablet twice a day as needed for moderate to sever pain #60 and  Morphine Sulfate ER 15 mg Q 8 hours. 03/23/2021. We will continue the opioid monitoring program, this consists of regular clinic visits, examinations, urine drug screen, pill counts as well as use of [place] Controlled Substance Reporting system. A 12 month History has been reviewed on the [place] Controlled Substance Reporting System on 02/19/2021. Encouraged to Continue exercise regime.   2. Lumbar Radiculopathy: No complaints today. Continue Topamax:.Continue to Monitor 03/23/2021 3.Chronic  Right Knee Pain: No complaints today.  Continue with Ice Therapy and  Voltaren Gel. 03/23/2021 4. Muscle Spasm: Continue current treatment: Tizanidine. 03/23/2021  5. Lipoma: S/P Left  Lipoma Excision of upper back  By Dr. Brantley Stage on 01/03/2018. Surgery Following. Continue to Monitor. 03/23/2021.   6. Paresthesia of Skin: Continue Topamax. Continue to monitor. 03/23/2021 7. Adhesive Capsulitis of Right Shoulder:Right Shoulder Tendonitis Pain:  S/P Right Shoulder Cortisone Injection with Dr Letta Pate, with good relief noted. Continue to monitor. Continue HEP as Tolerated. 03/23/2021  8. Polyarthralgia: Continue HEP as Tolerated. Continue to Monitor.03/23/2021 9. Bradycardia: Apical Pulse check: Ms. Dollens reports Cardiologist following.  F/U in 1  month

## 2021-03-24 ENCOUNTER — Ambulatory Visit (HOSPITAL_BASED_OUTPATIENT_CLINIC_OR_DEPARTMENT_OTHER): Payer: Medicare Other | Attending: Cardiovascular Disease | Admitting: Cardiovascular Disease

## 2021-03-24 DIAGNOSIS — Z79899 Other long term (current) drug therapy: Secondary | ICD-10-CM | POA: Insufficient documentation

## 2021-03-24 DIAGNOSIS — R0902 Hypoxemia: Secondary | ICD-10-CM | POA: Insufficient documentation

## 2021-03-24 DIAGNOSIS — G4733 Obstructive sleep apnea (adult) (pediatric): Secondary | ICD-10-CM | POA: Diagnosis not present

## 2021-03-24 DIAGNOSIS — R0683 Snoring: Secondary | ICD-10-CM

## 2021-03-24 DIAGNOSIS — Z79891 Long term (current) use of opiate analgesic: Secondary | ICD-10-CM | POA: Diagnosis not present

## 2021-03-24 DIAGNOSIS — R400 Somnolence: Secondary | ICD-10-CM | POA: Diagnosis present

## 2021-03-25 ENCOUNTER — Telehealth: Payer: Self-pay | Admitting: *Deleted

## 2021-03-25 ENCOUNTER — Telehealth: Payer: Self-pay | Admitting: Registered Nurse

## 2021-03-25 MED ORDER — MORPHINE SULFATE ER 15 MG PO TBCR: 15.0000 mg | EXTENDED_RELEASE_TABLET | Freq: Three times a day (TID) | ORAL | 0 refills | Status: DC

## 2021-03-25 NOTE — Telephone Encounter (Signed)
Jol[facility] called and [facility] does not have the Rx for Ms Contin. I have verified with Cristie Hem  the pharmacist that they do not have it.

## 2021-03-25 NOTE — Telephone Encounter (Signed)
[facility] called stating [facility] didn't [facility] Ms. Scogin MS Contin prescription, Sybil RN spoke with Cristie Hem  MS Contin e-scribed today.

## 2021-03-31 ENCOUNTER — Other Ambulatory Visit: Payer: Self-pay | Admitting: Family Medicine

## 2021-03-31 DIAGNOSIS — E7800 Pure hypercholesterolemia, unspecified: Secondary | ICD-10-CM

## 2021-04-02 ENCOUNTER — Telehealth: Payer: Self-pay

## 2021-04-02 NOTE — Telephone Encounter (Signed)
Received fax from pharmacy, PA needed on Diclofenac Sodium 3% gel.  Clinical questions submitted via Cover My Meds.  Waiting on response, could take up to 72 hours.  Cover My Meds info: Key:  [id number]  Talbot Grumbling, RN

## 2021-04-08 NOTE — Telephone Encounter (Signed)
Pharmacy calls nurse line stating Diclofenac Sodium 3% does not exist in the system. Pharmacy requesting a change in medication. Please advise.

## 2021-04-09 ENCOUNTER — Ambulatory Visit: Payer: Medicare Other | Admitting: Orthopaedic Surgery

## 2021-04-15 ENCOUNTER — Encounter (HOSPITAL_BASED_OUTPATIENT_CLINIC_OR_DEPARTMENT_OTHER): Payer: Self-pay | Admitting: Cardiovascular Disease

## 2021-04-15 NOTE — Procedures (Signed)
Patient Name: [name] [name], [name] [name] Date: 03/24/2021 Gender: Female D.O.B: 1955-09-10 Age (years): 65 Referring Provider: Skeet Latch Height (inches): 64 Interpreting Physician: Shelva Majestic MD, ABSM Weight (lbs): 210 RPSGT: Earney [place] BMI: 36 MRN: [id number] Neck Size: 16.00  CLINICAL INFORMATION Sleep Study Type: NPSG  Indication for sleep study: Snoring, A  Epworth Sleepiness Score: 3  SLEEP STUDY TECHNIQUE As per the AASM Manual for the Scoring of Sleep and Associated Events v2.3 (April 2016) with a hypopnea requiring 4% desaturations.  The channels recorded and monitored were frontal, central and occipital EEG, electrooculogram (EOG), submentalis EMG (chin), nasal and oral airflow, thoracic and abdominal wall motion, anterior tibialis EMG, snore microphone, electrocardiogram, and pulse oximetry.  MEDICATIONS atorvastatin (LIPITOR) 20 MG tablet chlorthalidone (HYGROTON) 25 MG tablet Diclofenac Sodium 3 % GEL fluticasone (FLONASE) 50 MCG/ACT nasal spray morphine (MS CONTIN) 15 MG 12 hr tablet morphine (MSIR) 15 MG tablet naloxone (NARCAN) nasal spray 4 mg/0.1 mL potassium chloride (KLOR-CON) 20 MEQ packet tiZANidine (ZANAFLEX) 2 MG tablet topiramate (TOPAMAX) 50 MG tablet Medications self-administered by patient taken the night of the study : TIZANIDINE  SLEEP ARCHITECTURE The study was initiated at 10:00:04 PM and ended at 4:35:21 AM.  Sleep onset time was 22.4 minutes and the sleep efficiency was 50.8%%. The total sleep time was 200.9 minutes.  Stage REM latency was 335.0 minutes.  The patient spent 1.7%% of the night in stage N1 sleep, 80.6%% in stage N2 sleep, 0.0%% in stage N3 and 17.7% in REM.  Alpha intrusion was absent.  Supine sleep was 64.72%.  RESPIRATORY PARAMETERS The overall apnea/hypopnea index (AHI) was 7.8 per hour. The respiratory disturbance index (RDI) was 8.4/h.  There were 0 total apneas, including 0 obstructive,  0 central and 0 mixed apneas. There were 26 hypopneas and 2 RERAs.  The AHI during Stage REM sleep was 33.8 per hour.  AHI while supine was 1.4 per hour.  The mean oxygen saturation was 94.4%. The minimum SpO2 during sleep was 83.0%.  Moderate snoring was noted during this study.  CARDIAC DATA The 2 lead EKG demonstrated sinus rhythm. The mean heart rate was 51.6 beats per minute. Other EKG findings include: None.  LEG MOVEMENT DATA The total PLMS were 0 with a resulting PLMS index of 0.0. Associated arousal with leg movement index was 0.0 .  IMPRESSIONS - Mild obstructive sleep apnea overall (AHI  7.8/h; RDI 8.4/h); however, sleep apnea was severe during REM sleep (AHI 33.8/h). - Moderate  oxygen desaturation to a nadir of  83%. - The patient snored with moderate snoring volume. - Reduced slep efficiency at only 50.8%. - No cardiac abnormalities were noted during this study. - Clinically significant periodic limb movements did not occur during sleep. No significant associated arousals.  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 - Effort should be made to optimize nasal and oropharyngeal patency. - Avoid alcohol, sedatives and other CNS depressants that may worsen sleep apnea and disrupt normal sleep architecture. - Sleep hygiene should be reviewed to assess factors that may improve sleep quality. - Weight management (BMI 36) and regular exercise should be initiated or continued if appropriate.  [Electronically signed] 04/15/2021 09:03 AM  Shelva Majestic MD, [facility], ABSM Diplomate, American Board of Sleep  Medicine   NPI: [id number] [place] PH: [phone]   FX: [phone] [place]

## 2021-04-16 ENCOUNTER — Other Ambulatory Visit: Payer: Self-pay | Admitting: Cardiovascular Disease

## 2021-04-16 ENCOUNTER — Telehealth: Payer: Self-pay | Admitting: *Deleted

## 2021-04-16 DIAGNOSIS — G4733 Obstructive sleep apnea (adult) (pediatric): Secondary | ICD-10-CM

## 2021-04-16 DIAGNOSIS — G4736 Sleep related hypoventilation in conditions classified elsewhere: Secondary | ICD-10-CM

## 2021-04-16 NOTE — Telephone Encounter (Signed)
Left message to return a call to discuss sleep study results and recommendations. 

## 2021-04-19 NOTE — Telephone Encounter (Signed)
Patient returned a call to me and was given her sleep study results and recommendations. She agrees to proceed with CPAP titration study scheduled on 05/16/21.

## 2021-04-21 ENCOUNTER — Other Ambulatory Visit: Payer: Self-pay

## 2021-04-21 ENCOUNTER — Encounter: Payer: Medicare Other | Attending: Physical Medicine & Rehabilitation | Admitting: Registered Nurse

## 2021-04-21 ENCOUNTER — Encounter: Payer: Self-pay | Admitting: Registered Nurse

## 2021-04-21 VITALS — BP 137/77 | HR 56 | Ht 64.0 in | Wt 218.0 lb

## 2021-04-21 DIAGNOSIS — Z79891 Long term (current) use of opiate analgesic: Secondary | ICD-10-CM | POA: Diagnosis not present

## 2021-04-21 DIAGNOSIS — M961 Postlaminectomy syndrome, not elsewhere classified: Secondary | ICD-10-CM | POA: Insufficient documentation

## 2021-04-21 DIAGNOSIS — G894 Chronic pain syndrome: Secondary | ICD-10-CM | POA: Diagnosis not present

## 2021-04-21 DIAGNOSIS — R001 Bradycardia, unspecified: Secondary | ICD-10-CM | POA: Insufficient documentation

## 2021-04-21 DIAGNOSIS — M62838 Other muscle spasm: Secondary | ICD-10-CM | POA: Diagnosis not present

## 2021-04-21 DIAGNOSIS — Z5181 Encounter for therapeutic drug level monitoring: Secondary | ICD-10-CM | POA: Diagnosis not present

## 2021-04-21 DIAGNOSIS — M4807 Spinal stenosis, lumbosacral region: Secondary | ICD-10-CM | POA: Insufficient documentation

## 2021-04-21 MED ORDER — MORPHINE SULFATE 15 MG PO TABS: 15.0000 mg | ORAL_TABLET | Freq: Two times a day (BID) | ORAL | 0 refills | Status: DC | PRN

## 2021-04-21 MED ORDER — MORPHINE SULFATE ER 15 MG PO TBCR: 15.0000 mg | EXTENDED_RELEASE_TABLET | Freq: Three times a day (TID) | ORAL | 0 refills | Status: DC

## 2021-04-21 NOTE — Progress Notes (Signed)
Subjective:    Patient ID: [name] [name], female    DOB: 10/03/1955, 65 y.o.   MRN: [id number]  HPI: [name] [name] is a 65 y.o. female who returns for follow up appointment for chronic pain and medication refill. She states her pain is located in her lower back. She rates her pain 4. Her current exercise regime is walking and performing stretching exercises.  [name] Morphine equivalent is 75.00  MME.   Last UDS was Performed on 02/19/2021, it was consistent.     Pain Inventory Average Pain 4 Pain Right Now 4 My pain is constant, stabbing, and aching  In the last 24 hours, has pain interfered with the following? General activity 4 Relation with others 4 Enjoyment of life 4 What TIME of day is your pain at its worst? morning , daytime, and night Sleep (in general) Poor  Pain is worse with: walking, inactivity, and standing Pain improves with: rest, heat/ice, medication, and injections Relief from Meds: 1  Family History  Problem Relation Age of Onset   Hyperlipidemia Mother    Heart attack Mother    Heart disease Mother 24       CHF; CAD   Hypertension Mother    Heart failure Mother    Diabetes Sister    Lupus Sister    Kidney failure Sister    Hyperlipidemia Sister    Hypertension Sister    Hypertension Brother    Stroke Maternal Aunt    Colon cancer Neg Hx    Rectal cancer Neg Hx    Social History   Socioeconomic History   Marital status: Widowed    Spouse name: Not on file   Number of children: 3   Years of education: Not on file   Highest education level: Not on file  Occupational History   Occupation: disability    Comment: 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  Tobacco Use   Smoking status: Former    Packs/day: 0.50    Years: 20.00    Pack years: 10.00    Types: Cigarettes    Quit date: 07/21/2008    Years since quitting: 12.7   Smokeless tobacco: Never  Vaping Use   Vaping Use: Never used  Substance and Sexual Activity   Alcohol use: No   Drug use:  No   Sexual activity: Not on file  Other Topics Concern   Not on file  Social History Narrative   Marital status: widowed since 2002; not dating which is bad in 2018      Children: 3 children (45, 79, 40); 13 grandchildren; 1 gg      Employment: disability since 2009; retail      Lives: alone in [place] in apartment      Tobacco: quit in 2011      Alcohol: none      Drugs: none      Exercise: minimal in 2018      ADLs: independent with ADLs; drives      Seatbelt: 100%; no texting.   Social Determinants of Health   Financial Resource Strain: Not on file  Food Insecurity: Not on file  Transportation Needs: Not on file  Physical Activity: Not on file  Stress: Not on file  Social Connections: Not on file   Past Surgical History:  Procedure Laterality Date   ABDOMINAL HYSTERECTOMY  1982   DUB; ovaries intact   bladder surg  1992   bladder retraction   BREAST BIOPSY  BREAST REDUCTION SURGERY  1984   CARDIAC CATHETERIZATION  [place]   COLON SURGERY  05/11/11   [facility] Lysis of adhesions Crohn's   MASS EXCISION Left 01/03/2018   Procedure: EXCISION LEFT UPPER BACK LIPOMA ERAS PATH;  Surgeon: Erroll Luna, MD;  Location: [place];  Service: General;  Laterality: Left;   REDUCTION MAMMAPLASTY     SPINE SURGERY     5 total (1 upper, 4 lumbar)   SPINE SURGERY     nerve stimulator on right side   Past Surgical History:  Procedure Laterality Date   ABDOMINAL HYSTERECTOMY  1982   DUB; ovaries intact   bladder surg  1992   bladder retraction   BREAST BIOPSY     BREAST REDUCTION SURGERY  1984   CARDIAC CATHETERIZATION  1990's   [name]   COLON SURGERY  05/11/11   [facility] Lysis of adhesions Crohn's   MASS EXCISION Left 01/03/2018   Procedure: EXCISION LEFT UPPER BACK LIPOMA ERAS PATH;  Surgeon: Erroll Luna, MD;  Location: [facility];  Service: General;  Laterality: Left;   REDUCTION MAMMAPLASTY     SPINE SURGERY     5  total (1 upper, 4 lumbar)   SPINE SURGERY     nerve stimulator on right side   Past Medical History:  Diagnosis Date   Anemia    Blood transfusion without reported diagnosis    Crohn's disease ([place])    Disorder of sacroiliac joint    Hyperlipidemia    Hypertension    Ischemic colitis ([place])    Lumbago    Lumbar post-laminectomy syndrome    Lumbosacral neuritis    Lumbosacral radiculitis    Sciatica    Ht 5\' 4"  (1.626 m)   Wt 218 lb (98.9 kg)   BMI 37.42 kg/m   Opioid Risk Score:   Fall Risk Score:  `1  Depression screen PHQ 2/9  Depression screen [facility] 2/9 04/21/2021 03/23/2021 03/17/2021 02/19/2021 12/24/2020 12/22/2020 11/26/2020  Decreased Interest 0 0 1 0 2 0 0  Down, Depressed, Hopeless 0 0 0 0 0 0 0  PHQ - 2 Score 0 0 1 0 2 0 0  Altered sleeping - - 0 - 1 - -  Tired, decreased energy - - 2 - 3 - -  Change in appetite - - 3 - 3 - -  Feeling bad or failure about yourself  - - 0 - 0 - -  Trouble concentrating - - 0 - 0 - -  Moving slowly or fidgety/restless - - 0 - 0 - -  Suicidal thoughts - - 0 - 0 - -  PHQ-9 Score - - 6 - 9 - -  Difficult doing work/chores - - Not difficult at all - - - -  Some recent data might be hidden     Review of Systems  Constitutional: Negative.   HENT: Negative.    Eyes: Negative.   Respiratory: Negative.    Cardiovascular: Negative.   Gastrointestinal: Negative.   Endocrine: Negative.   Genitourinary: Negative.   Musculoskeletal:  Positive for back pain.  Skin: Negative.   Allergic/Immunologic: Negative.   Neurological: Negative.   Hematological: Negative.   Psychiatric/Behavioral: Negative.        Objective:   Physical Exam Vitals and nursing note reviewed.  Constitutional:      Appearance: Normal appearance.  Cardiovascular:     Rate and Rhythm: Normal rate and regular rhythm.  Heart sounds: Normal heart sounds.  Pulmonary:     Effort: Pulmonary effort is normal.     Breath sounds: Normal breath sounds.  Musculoskeletal:      Cervical back: Normal range of motion and neck supple.     Comments: Normal Muscle Bulk and Muscle Testing Reveals:  Upper Extremities:Full  ROM and Muscle Strength 5/5  Lumbar Paraspinal Tenderness: L-3-L-5 Lower Extremities: Full ROM and Muscle Strength 5/5 Arises From Table with Ease  Narrow Based Gait     Skin:    General: Skin is warm and dry.  Neurological:     Mental Status: She is alert and oriented to person, place, and time.  Psychiatric:        Mood and Affect: Mood normal.        Behavior: Behavior normal.         Assessment & Plan:  1.Lumbar Postlaminectomy/ Spinal Stenosis Lumbar Region/ Post-Op Pain/Low back pain with radiating symptoms in L3-4 distribution:  continues to be limited by pain. Refilled:  MSIR 15 mg one tablet twice a day as needed for moderate to sever pain #60 and  Morphine Sulfate ER 15 mg Q 8 hours. 04/21/2021. We will continue the opioid monitoring program, this consists of regular clinic visits, examinations, urine drug screen, pill counts as well as use of [place] Controlled Substance Reporting system. A 12 month History has been reviewed on the [place] Controlled Substance Reporting System on 02/19/2021. Encouraged to Continue exercise regime.   2. Lumbar Radiculopathy: No complaints today. Continue Topamax:.Continue to Monitor 04/21/2021 3.Chronic  Right Knee Pain: No complaints today.  Continue with Ice Therapy and  Voltaren Gel. 04/21/2021 4. Muscle Spasm: Continue current treatment: Tizanidine. 04/21/2021  5. Lipoma: S/P Left  Lipoma Excision of upper back  By Dr. Brantley Stage on 01/03/2018. Surgery Following. Continue to Monitor. 04/21/2021.   6. Paresthesia of Skin: Continue Topamax. Continue to monitor. 04/21/2021 7. Adhesive Capsulitis of Right Shoulder:Right Shoulder Tendonitis Pain:  S/P Right Shoulder Cortisone Injection with Dr Letta Pate, with good relief noted. Continue to monitor. Continue HEP as Tolerated. 04/21/2021  8.  Polyarthralgia: Continue HEP as Tolerated. Continue to Monitor.04/21/2021 9. Bradycardia: Apical Pulse check: Ms. Hovland reports Cardiologist following.Continue to Monitor   F/U in 1 month

## 2021-04-22 ENCOUNTER — Other Ambulatory Visit: Payer: Self-pay | Admitting: Family Medicine

## 2021-04-22 DIAGNOSIS — J301 Allergic rhinitis due to pollen: Secondary | ICD-10-CM

## 2021-05-16 ENCOUNTER — Ambulatory Visit (HOSPITAL_BASED_OUTPATIENT_CLINIC_OR_DEPARTMENT_OTHER): Payer: Medicare Other | Attending: Cardiovascular Disease | Admitting: Cardiovascular Disease

## 2021-05-16 ENCOUNTER — Other Ambulatory Visit: Payer: Self-pay | Admitting: Family Medicine

## 2021-05-16 ENCOUNTER — Other Ambulatory Visit: Payer: Self-pay

## 2021-05-16 DIAGNOSIS — G4736 Sleep related hypoventilation in conditions classified elsewhere: Secondary | ICD-10-CM | POA: Insufficient documentation

## 2021-05-16 DIAGNOSIS — G4733 Obstructive sleep apnea (adult) (pediatric): Secondary | ICD-10-CM | POA: Diagnosis not present

## 2021-05-20 ENCOUNTER — Ambulatory Visit (HOSPITAL_BASED_OUTPATIENT_CLINIC_OR_DEPARTMENT_OTHER): Payer: Medicare Other | Admitting: Cardiovascular Disease

## 2021-05-20 NOTE — Progress Notes (Incomplete)
Cardiology Office Note:    Date:  05/20/2021   ID:  [name] [name], DOB 13-Feb-1956, MRN [id number]  PCP:  Lyndee Hensen, DO   [facility] Cardiologist:  Skeet Latch, MD     Referring MD: Lyndee Hensen, DO   No chief complaint on file.   History of Present Illness:    [name] [name] is a 65 y.o. female with a hx of anemia, Crohn's disease, hyperlipidemia, hypertension, and sciatica here for follow-up. She was initially seen 01/01/2021 for the evaluation of heart palpitations. She saw her PCP on 12/24/2020 and it was noted her heart rate was in the 50s and 60s  She reported getting dizzy after getting out of the shower. They did an EKG which showed sinus bradycardia, and inferolateral T wave inversions. An Echo was ordered but has not been performed. Previous Echo 10/2019 revealed LVEF 55-60% with grade 1 diastolic dysfunction and moderate LVEH. She had a nuclear stress test 05/2016 that was negative for ischemia.  At her last appointment she reported worsening, daily episodes of heart rates suddenly changing from bradycardic to tachycardic, causing her to be lightheaded. This has been ongoing for a few years, and she denied any syncope. She also noted diaphoresis from minimal exertion, and one isolated episode of sharp chest pain. Formal exercise was mostly limited by chronic back pain.  Today, for a few years she has been noticing her slow heart rate. After it slows down, her heart will then suddenly pump faster "as if it needs to jump-start" and sometimes making her lightheaded. Usually she needs to stop and hold the wall or shower curtain until the symptoms dissipate. These HR episodes are fairly constant and occur every da  She wore a 3-day Zio monitor 12/2020 which showed 5 beats of SVT and occasional PVCs.  She had carotid dopplers that showed minimal plaque. Echo 01/13/2021 revealed LVEF 55-60% with moderate to mild LVH, and grade 1 diastolic dysfunction.   She  followed up with Laurann Montana, NP 02/05/2021 and was referred for a sleep study given that the only bradycardia on her monitor occurred when sleeping. She had mild sleep apnea and CPAP was recommended.   Today,  She denies any palpitations, chest pain, or shortness of breath. No lightheadedness, headaches, syncope, orthopnea, PND, lower extremity edema or exertional symptoms.  (+)  Past Medical History:  Diagnosis Date   Anemia    Blood transfusion without reported diagnosis    Crohn's disease ([place])    Disorder of sacroiliac joint    Hyperlipidemia    Hypertension    Ischemic colitis ([place])    Lumbago    Lumbar post-laminectomy syndrome    Lumbosacral neuritis    Lumbosacral radiculitis    Sciatica     Past Surgical History:  Procedure Laterality Date   ABDOMINAL HYSTERECTOMY  1982   DUB; ovaries intact   bladder surg  1992   bladder retraction   BREAST BIOPSY     BREAST REDUCTION SURGERY  1984   CARDIAC CATHETERIZATION  [place]  05/11/11   [facility] Lysis of adhesions Crohn's   MASS EXCISION Left 01/03/2018   Procedure: EXCISION LEFT UPPER BACK LIPOMA ERAS PATH;  Surgeon: Erroll Luna, MD;  Location: [place];  Service: General;  Laterality: Left;   REDUCTION MAMMAPLASTY     SPINE SURGERY     5 total (1 upper, 4 lumbar)   SPINE SURGERY  nerve stimulator on right side    Current Medications: No outpatient medications have been marked as taking for the 05/20/21 encounter (Appointment) with Skeet Latch, MD.     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  Tobacco Use   Smoking status: Former    Packs/day: 0.50    Years: 20.00    Pack years: 10.00     Types: Cigarettes    Quit date: 07/21/2008    Years since quitting: 12.8   Smokeless tobacco: Never  Vaping Use   Vaping Use: Never used  Substance and Sexual Activity   Alcohol use: No   Drug use: No   Sexual activity: Not on file  Other Topics Concern   Not on file  Social History Narrative   Marital status: widowed since 2002; not dating which is bad in 2018      Children: 3 children (45, 48, 40); 13 grandchildren; 1 gg      Employment: disability since 2009; retail      Lives: alone in [place] in apartment      Tobacco: quit in 2011      Alcohol: none      Drugs: none      Exercise: minimal in 2018      ADLs: independent with ADLs; drives      Seatbelt: 100%; no texting.   Social Determinants of Health   Financial Resource Strain: Not on file  Food Insecurity: Not on file  Transportation Needs: Not on file  Physical Activity: Not on file  Stress: Not on file  Social Connections: Not on file     Family History: The patient's family history includes Diabetes in her sister; Heart attack in her mother; Heart disease (age of onset: 9) in her mother; Heart failure in her mother; Hyperlipidemia in her mother and sister; Hypertension in her brother, mother, and sister; Kidney failure in her sister; Lupus in her sister; Stroke in her maternal aunt. There is no history of Colon cancer or Rectal cancer.  ROS:   Please see the history of present illness.    (+) Episodes of atypical heart rate (+) Lightheadedness (+) Diaphoresis (+) Chest pain (+) Severe back pain (+) Loss of appetite (+) Snores (+) Dry mouth (+) Dizziness (+) Nausea (+) Right neck pain All other systems reviewed and are negative.  EKGs/Labs/Other Studies Reviewed:    The following studies were reviewed today:  Echo 11/11/2019:  1. Left ventricular ejection fraction, by estimation, is 55 to 60%. Left  ventricular ejection fraction by 3D volume is 56 %. The left ventricle has  normal function.  The left ventricle has no regional wall motion  abnormalities. There is moderate left  ventricular hypertrophy. Left ventricular diastolic parameters are  consistent with Grade I diastolic dysfunction (impaired relaxation). The  average left ventricular global longitudinal strain is -28.3 %. The global  longitudinal strain is normal.   2. Right ventricular systolic function is normal. The right ventricular  size is normal.   3. The mitral valve is degenerative. Trivial mitral valve regurgitation.  No evidence of mitral stenosis.   4. The aortic valve is tricuspid. Aortic valve regurgitation is not  visualized. No aortic stenosis is present.   5. The inferior vena cava is normal in size with greater than 50%  respiratory variability, suggesting right atrial pressure of 3 mmHg.   Comparison(s): No significant change from prior study.   EKG:   01/01/2021: Sinus bradycardia, Rate 52 bpm. Nonspecific T wave abnormalities.  Recent Labs: 12/24/2020: Hemoglobin 12.3; Platelets 245; TSH 2.370  Recent Lipid Panel    Component Value Date/Time   CHOL 190 10/06/2020 1439   TRIG 225 (H) 10/06/2020 1439   HDL 39 (L) 10/06/2020 1439   CHOLHDL 4.9 (H) 10/06/2020 1439   CHOLHDL 4.9 03/09/2016 1319   VLDL 51 (H) 03/09/2016 1319   LDLCALC 112 (H) 10/06/2020 1439   LDLDIRECT 107 (H) 10/31/2019 1013   STOP-Bang Score:  6  { Consider Dx Sleep Disordered Breathing or Sleep Apnea   ICD G47.33          :1}     Physical Exam:    VS:  There were no vitals taken for this visit.    Wt Readings from Last 3 Encounters:  05/16/21 218 lb (98.9 kg)  04/21/21 218 lb (98.9 kg)  03/24/21 210 lb (95.3 kg)     GEN: Well nourished, well developed in no acute distress HEENT: Normal NECK: No JVD; No carotid bruits LYMPHATICS: No lymphadenopathy CARDIAC: RRR, no murmurs, rubs, gallops RESPIRATORY:  Clear to auscultation without rales, wheezing or rhonchi  ABDOMEN: Soft, non-tender,  non-distended MUSCULOSKELETAL:  No edema; No deformity  SKIN: Warm and dry NEUROLOGIC:  Alert and oriented x 3 PSYCHIATRIC:  Normal affect   ASSESSMENT:    No diagnosis found.  PLAN:   No problem-specific Assessment & Plan notes found for this encounter.   Disposition: FU with Tiffany C. Oval Linsey, MD, [facility] in 1 month   Medication Adjustments/Labs and Tests Ordered: Current medicines are reviewed at length with the patient today.  Concerns regarding medicines are outlined above.  No orders of the defined types were placed in this encounter.  No orders of the defined types were placed in this encounter.   There are no Patient Instructions on file for this visit.   I,Mathew Stumpf,acting as a Education administrator for Skeet Latch, MD.,have documented all relevant documentation on the behalf of Skeet Latch, MD,as directed by  Skeet Latch, MD while in the presence of Skeet Latch, MD.  I, [place] Oval Linsey, MD have reviewed all documentation for this visit.  The documentation of the exam, diagnosis, procedures, and orders on 05/20/2021 are all accurate and complete.   Waynetta Pean  05/20/2021 7:23 AM    [facility]

## 2021-05-27 ENCOUNTER — Other Ambulatory Visit: Payer: Self-pay

## 2021-05-27 ENCOUNTER — Ambulatory Visit (INDEPENDENT_AMBULATORY_CARE_PROVIDER_SITE_OTHER): Payer: Medicare Other | Admitting: Cardiovascular Disease

## 2021-05-27 VITALS — BP 128/60 | HR 65 | Ht 64.0 in | Wt 217.0 lb

## 2021-05-27 DIAGNOSIS — I10 Essential (primary) hypertension: Secondary | ICD-10-CM

## 2021-05-27 DIAGNOSIS — R0609 Other forms of dyspnea: Secondary | ICD-10-CM | POA: Diagnosis not present

## 2021-05-27 DIAGNOSIS — G4733 Obstructive sleep apnea (adult) (pediatric): Secondary | ICD-10-CM

## 2021-05-27 DIAGNOSIS — R001 Bradycardia, unspecified: Secondary | ICD-10-CM | POA: Diagnosis not present

## 2021-05-27 DIAGNOSIS — E7800 Pure hypercholesterolemia, unspecified: Secondary | ICD-10-CM

## 2021-05-27 MED ORDER — METOPROLOL SUCCINATE ER 25 MG PO TB24: ORAL_TABLET | ORAL | 3 refills | Status: DC

## 2021-05-27 NOTE — Progress Notes (Addendum)
Cardiology Office Note:    Date:  07/29/2021   ID:  [name] [name], DOB [birth year], MRN [id number]  PCP:  Lyndee Hensen, DO   [facility] Cardiologist:  Skeet Latch, MD     Referring MD: Lyndee Hensen, DO   No chief complaint on file.   History of Present Illness:    [name] [name] is a 65 y.o. female with a hx of anemia, Crohn's disease, hyperlipidemia, hypertension, and sciatica here for follow-up. She presented 01/01/2021 for the initial evaluation of heart palpitations. She saw her PCP on 12/24/2020 and it was noted her heart rate was in the 50s and 60s  She reported getting dizzy after getting out of the shower. They did an EKG which showed sinus bradycardia, and inferolateral T wave inversions. An Echo was ordered but has not been performed. Previous Echo 10/2019 revealed LVEF 55-60% with grade 1 diastolic dysfunction and moderate LVEH. She had a nuclear stress test 05/2016 that was negative for ischemia.   At her last appointment, she reported palpitations and lightheadedness when getting out of the shower. She has known bradycardia. She wore a 3-day Zio which showed an average heart rate of 67 bpm with a minimum of 42 bpm. She had five beats of SVT and occasional PVCs. She followed up with Laurann Montana, NP on 01/2021.  It was noted that her lowest heart rates occurred while sleeping, and she was referred for a sleep study. She had an Echo 12/2020 with LVEF 55-60%, mild to moderate LVH and grade 1 diastolic dysfunction. She had a sleep study 03/2021. She did have some hypoxic events, but woke up at 1 AM and was unable to fall back asleep.  Today, she is accompanied by her sister [name] [name]. In 03/2021 to 04/2021 she developed episodes of "flutters", with a feeling like "a hard thrust" that would cause dizziness. Recently, her palpitations have been occurring every now and then but are still concerning. Her flutters are not constant, they are occurring quickly and  intermittently. She had one isolated episode of chest pain while sitting and watching TV. From minimal exertion such as walking up stairs she will become short of breath and fatigued as if she just ran a mile. Her shortness of breath has worsened lately. During the day she has also noticed increased drowsiness. Her exercise and activity is sometimes limited by her chronic back pain. In 02/2021 she had a spinal cord stimulator implanted. She is not sure if this is helping. She denies any headaches, syncope, orthopnea, PND, or lower extremity edema.  Past Medical History:  Diagnosis Date   Anemia    Blood transfusion without reported diagnosis    Crohn's disease ([place])    Disorder of sacroiliac joint    Exertional dyspnea 05/28/2021   Hyperlipidemia    Hypertension    Ischemic colitis ([place])    Lumbago    Lumbar post-laminectomy syndrome    Lumbosacral neuritis    Lumbosacral radiculitis    OSA (obstructive sleep apnea) 05/28/2021   Sciatica     Past Surgical History:  Procedure Laterality Date   ABDOMINAL HYSTERECTOMY  1982   DUB; ovaries intact   bladder surg  1992   bladder retraction   BREAST BIOPSY     BREAST REDUCTION SURGERY  1984   CARDIAC CATHETERIZATION  1990's   [name]   COLON SURGERY  05/11/11   [facility] of adhesions Crohn's   MASS EXCISION Left 01/03/2018   Procedure: EXCISION LEFT UPPER  BACK LIPOMA ERAS PATH;  Surgeon: Erroll Luna, MD;  Location: [facility];  Service: General;  Laterality: Left;   REDUCTION MAMMAPLASTY     SPINE SURGERY     5 total (1 upper, 4 lumbar)   SPINE SURGERY     nerve stimulator on right side    Current Medications: Current Meds  Medication Sig   atorvastatin (LIPITOR) 20 MG tablet TAKE 1 TABLET BY MOUTH EVERY DAY   Diclofenac Sodium 3 % GEL Apply 1 application topically 4 (four) times daily as needed.   fluticasone (FLONASE) 50 MCG/ACT nasal spray SPRAY 2 SPRAYS INTO EACH NOSTRIL EVERY DAY   metoprolol succinate  (TOPROL XL) 25 MG 24 hr tablet TAKE 1/2 TABLET BY MOUTH DAILY   naloxone (NARCAN) nasal spray 4 mg/0.1 mL Lethargic or respiratory depression after opioid use   potassium chloride (KLOR-CON) 20 MEQ packet USE 1 PACKET AS INSTRUCTED ONCE A DAY   tiZANidine (ZANAFLEX) 2 MG tablet TAKE 1 TABLET (2 MG TOTAL) BY MOUTH 2 (TWO) TIMES DAILY AS NEEDED FOR MUSCLE SPASMS.   [DISCONTINUED] chlorthalidone (HYGROTON) 25 MG tablet TAKE 1 TABLET (25 MG TOTAL) BY MOUTH DAILY.   [DISCONTINUED] morphine (MS CONTIN) 15 MG 12 hr tablet Take 1 tablet (15 mg total) by mouth every 8 (eight) hours. Do Not Fill Before 05/21/2021   [DISCONTINUED] morphine (MSIR) 15 MG tablet Take 1 tablet (15 mg total) by mouth 2 (two) times daily as needed for severe pain. 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  Tobacco Use   Smoking status: Former    Packs/day: 0.50    Years: 20.00    Pack years: 10.00    Types: Cigarettes    Quit date: 07/21/2008    Years since quitting: 13.0   Smokeless tobacco: Never  Vaping Use   Vaping Use: Never used  Substance and Sexual Activity   Alcohol use: No   Drug use: No   Sexual activity: Not on file  Other Topics Concern   Not on file  Social History Narrative   Marital status: widowed since 2002; not dating which is bad in 2018      Children: 3 children (45, 53, 40); 13 grandchildren; 1 gg      Employment: disability since 2009; retail      Lives: alone in [place] in apartment      Tobacco: quit in 2011      Alcohol: none      Drugs: none      Exercise: minimal in 2018      ADLs: independent with ADLs; drives      Seatbelt: 100%; no texting.   Social Determinants of Health    Financial Resource Strain: Not on file  Food Insecurity: Not on file  Transportation Needs: Not on file  Physical Activity: Not on file  Stress: Not on file  Social Connections: Not on file     Family History: The patient's family history includes Diabetes in her sister; Heart attack in her mother; Heart disease (age of onset: 54) in her mother; Heart failure in her mother; Hyperlipidemia in her  mother and sister; Hypertension in her brother, mother, and sister; Kidney failure in her sister; Lupus in her sister; Stroke in her maternal aunt. There is no history of Colon cancer or Rectal cancer.  ROS:   Please see the history of present illness.    (+) Palpitations (+) Exertional shortness of breath (+) Fatigue (+) Drowsiness (+) Dizziness (+) Chest pain (+) Chronic back pain All other systems reviewed and are negative.  EKGs/Labs/Other Studies Reviewed:    The following studies were reviewed today:  Echo 01/13/2021:  1. Left ventricular ejection fraction, by estimation, is 55 to 60%. The  left ventricle has normal function. The left ventricle has no regional  wall motion abnormalities. There is mild-to-moderate concentric left  ventricular hypertrophy. Left ventricular   diastolic parameters are consistent with Grade I diastolic dysfunction  (impaired relaxation).   2. Right ventricular systolic function is normal. The right ventricular  size is normal.   3. The mitral valve is normal in structure. Trivial mitral valve  regurgitation. No evidence of mitral stenosis.   4. The aortic valve was not well visualized. Aortic valve regurgitation  is not visualized. No aortic stenosis is present.   5. The inferior vena cava is normal in size with greater than 50%  respiratory variability, suggesting right atrial pressure of 3 mmHg.   Comparison(s): Compared to prior echo on 11/11/19, there is no significant change.   Bilateral Carotid Duplex 01/13/2021: Summary:  Right Carotid:  The extracranial vessels were near-normal with only minimal wall thickening or plaque.   Left Carotid: The extracranial vessels were near-normal with only minimal  wall thickening or plaque.   Vertebrals:  Bilateral vertebral arteries demonstrate antegrade flow.  Subclavians: Normal flow hemodynamics were seen in bilateral subclavian arteries.   Monitor 12/2020: 3 Day Zio Monitor   Quality: Fair.  Baseline artifact. Predominant rhythm: sinus rhythm Average heart rate: 67 bpm Max heart rate: 145 bpm Min heart rate: 42 bpm Pauses >2.5 seconds: none 5 beat episode of SVT.  Heart rate 106 bpm. Occasional PVCs and ventricular bigeminy/trigeminy (<1%)  CT Chest 11/05/2020: FINDINGS: Cardiovascular: Normal heart size. Aortic atherosclerosis. Coronary artery calcification.   Mediastinum/Nodes: No enlarged mediastinal, hilar, or axillary lymph nodes. Thyroid gland, trachea, and esophagus demonstrate no significant findings.   Lungs/Pleura: Mild centrilobular emphysema with diffuse bronchial wall thickening. No pleural effusion, airspace consolidation or atelectasis. There are 2 non-solid nodules identified. The largest is in the left apex with a mean derived diameter of 14.6 mm.   Upper Abdomen: No acute abnormality.   Musculoskeletal: No chest wall mass or suspicious bone lesions identified. Dorsal column stimulator noted within the thoracic canal.   IMPRESSION: 1. Lung-RADS 2, benign appearance or behavior. Continue annual screening with low-dose chest CT without contrast in 12 months. 2. Coronary artery calcifications.   Aortic Atherosclerosis (ICD10-I70.0) and Emphysema (ICD10-J43.9).  Echo 11/11/2019:  1. Left ventricular ejection fraction, by estimation, is 55 to 60%. Left  ventricular ejection fraction by 3D volume is 56 %. The left ventricle has  normal function. The left ventricle has no regional wall motion  abnormalities. There is moderate left  ventricular  hypertrophy. Left ventricular diastolic parameters are  consistent with Grade I diastolic dysfunction (impaired relaxation). The  average left ventricular global longitudinal strain is -28.3 %. The global  longitudinal strain is normal.   2. Right ventricular systolic function is normal. The right ventricular  size is normal.   3. The mitral valve is degenerative. Trivial mitral valve  regurgitation.  No evidence of mitral stenosis.   4. The aortic valve is tricuspid. Aortic valve regurgitation is not  visualized. No aortic stenosis is present.   5. The inferior vena cava is normal in size with greater than 50%  respiratory variability, suggesting right atrial pressure of 3 mmHg.   Comparison(s): No significant change from prior study.   EKG:   05/27/2021: EKG was not ordered. 01/01/2021: Sinus bradycardia, Rate 52 bpm. Nonspecific T wave abnormalities.  Recent Labs: 12/24/2020: Hemoglobin 12.3; Platelets 245; TSH 2.370   Recent Lipid Panel    Component Value Date/Time   CHOL 190 10/06/2020 1439   TRIG 225 (H) 10/06/2020 1439   HDL 39 (L) 10/06/2020 1439   CHOLHDL 4.9 (H) 10/06/2020 1439   CHOLHDL 4.9 03/09/2016 1319   VLDL 51 (H) 03/09/2016 1319   LDLCALC 112 (H) 10/06/2020 1439   LDLDIRECT 107 (H) 10/31/2019 1013   STOP-Bang Score:  6       Physical Exam:    Wt Readings from Last 3 Encounters:  07/21/21 216 lb (98 kg)  06/22/21 216 lb (98 kg)  06/16/21 217 lb (98.4 kg)    VS:  BP 128/60 (BP Location: Right Arm, Patient Position: Sitting, Cuff Size: Large)   Pulse 65   Ht 5\' 4"  (1.626 m)   Wt 217 lb (98.4 kg)   SpO2 97%   BMI 37.25 kg/m  , BMI Body mass index is 37.25 kg/m. GENERAL:  Well appearing HEENT: Pupils equal round and reactive, fundi not visualized, oral mucosa unremarkable NECK:  No jugular venous distention, waveform within normal limits, carotid upstroke brisk and symmetric, no bruits, no thyromegaly LUNGS:  Clear to auscultation bilaterally HEART:   RRR.  PMI not displaced or sustained,S1 and S2 within normal limits, no S3, no S4, no clicks, no rubs, no murmurs ABD:  Flat, positive bowel sounds normal in frequency in pitch, no bruits, no rebound, no guarding, no midline pulsatile mass, no hepatomegaly, no splenomegaly EXT:  2 plus pulses throughout, no edema, no cyanosis no clubbing SKIN:  No rashes no nodules NEURO:  Cranial nerves II through XII grossly intact, motor grossly intact throughout PSYCH:  Cognitively intact, oriented to person place and time   ASSESSMENT:    1. DOE (dyspnea on exertion)   2. Essential hypertension   3. Bradycardia   4. OSA (obstructive sleep apnea)   5. Exertional dyspnea   6. Pure hypercholesterolemia     PLAN:    Essential hypertension BP is running a little high.  Given her bradycardia and untreated sleep apnea, we will not aggressively titrate her HTN meds at this time.  Continue chlorthalidone.  Add metoprolol 12.5mg  daily.  Bradycardia Ms Donn struggles with palpitations from PACs, PVCs and SVT.  We cannot add a lot of nodal agents due to bradycardia.  We will try metoprolol 12.5mg  daily.    OSA (obstructive sleep apnea) Ms Ericsson is symptomatic from OSA.  She is awaiting CPAP.  Exertional dyspnea Ms. Wirthlin has exertional dyspnea.  She does not appear to be volume overloaded and has no valvular disease.  We will get a Lexiscan Myoview to assess for ischemia.   Hyperlipemia Continue atorvastatin.    Disposition: FU with APP in 2 months. FU with Janai Brannigan C. Oval Linsey, MD, [facility] in 5 months.  Medication Adjustments/Labs and Tests Ordered: Current medicines are reviewed at length with the patient today.  Concerns regarding medicines are outlined above.   Orders Placed This Encounter  Procedures  Cardiac Stress Test: Informed Consent Details: Physician/Practitioner Attestation; Transcribe to consent form and obtain patient signature   MYOCARDIAL PERFUSION IMAGING    Meds ordered  this encounter  Medications   metoprolol succinate (TOPROL XL) 25 MG 24 hr tablet    Sig: TAKE 1/2 TABLET BY MOUTH DAILY    Dispense:  45 tablet    Refill:  3    Patient Instructions  Medication Instructions:  START METOPROLOL SUCC 25 MG 1/2 TABLET DAILY   *If you need a refill on your cardiac medications before your next appointment, please call your pharmacy*  Lab Work: NONE   Testing/Procedures: WILL REACH OUT TO SLEEP TEAM ABOUT THE SLEEP STUDY YOU HAD  Your physician has requested that you have a lexiscan myoview. For further information please visit [email or web address]. Please follow instruction sheet, as given.  Follow-Up: At [facility], you and your health needs are our priority.  As part of our continuing mission to provide you with exceptional heart care, we have created designated Provider Care Teams.  These Care Teams include your primary Cardiologist (physician) and Advanced Practice Providers (APPs -  Physician Assistants and Nurse Practitioners) who all work together to provide you with the care you need, when you need it.  We recommend signing up for the patient portal called "MyChart".  Sign up information is provided on this After Visit Summary.  MyChart is used to connect with patients for Virtual Visits (Telemedicine).  Patients are able to view lab/test results, encounter notes, upcoming appointments, etc.  Non-urgent messages can be sent to your provider as well.   To learn more about what you can do with MyChart, go to [email or web address].    Your next appointment:   5 month(s)  The format for your next appointment:   In Person  Provider:   Skeet Latch, MD   Your physician recommends that you schedule a follow-up appointment IR:JJOACZY W NP 2 MONTHS     I,Mathew Stumpf,acting as a scribe for Skeet Latch, MD.,have documented all relevant documentation on the behalf of Skeet Latch, MD,as directed by  Skeet Latch, MD while in the  presence of Skeet Latch, MD.  I, [place] Oval Linsey, MD have reviewed all documentation for this visit.  The documentation of the exam, diagnosis, procedures, and orders on 07/29/2021 are all accurate and complete.  Signed, Skeet Latch, MD  07/29/2021 1:28 PM    [facility]

## 2021-05-27 NOTE — Patient Instructions (Addendum)
Medication Instructions:  START METOPROLOL SUCC 25 MG 1/2 TABLET DAILY   *If you need a refill on your cardiac medications before your next appointment, please call your pharmacy*  Lab Work: NONE   Testing/Procedures: WILL REACH OUT TO SLEEP TEAM ABOUT THE SLEEP STUDY YOU HAD  Your physician has requested that you have a lexiscan myoview. For further information please visit [email or web address]. Please follow instruction sheet, as given.  Follow-Up: At [facility], you and your health needs are our priority.  As part of our continuing mission to provide you with exceptional heart care, we have created designated Provider Care Teams.  These Care Teams include your primary Cardiologist (physician) and Advanced Practice Providers (APPs -  Physician Assistants and Nurse Practitioners) who all work together to provide you with the care you need, when you need it.  We recommend signing up for the patient portal called "MyChart".  Sign up information is provided on this After Visit Summary.  MyChart is used to connect with patients for Virtual Visits (Telemedicine).  Patients are able to view lab/test results, encounter notes, upcoming appointments, etc.  Non-urgent messages can be sent to your provider as well.   To learn more about what you can do with MyChart, go to [email or web address].    Your next appointment:   5 month(s)  The format for your next appointment:   In Person  Provider:   Skeet Latch, MD   Your physician recommends that you schedule a follow-up appointment SE:LTRVUYE W NP 2 MONTHS

## 2021-05-28 ENCOUNTER — Encounter (HOSPITAL_BASED_OUTPATIENT_CLINIC_OR_DEPARTMENT_OTHER): Payer: Self-pay | Admitting: Cardiovascular Disease

## 2021-05-28 DIAGNOSIS — R0609 Other forms of dyspnea: Secondary | ICD-10-CM

## 2021-05-28 DIAGNOSIS — G4733 Obstructive sleep apnea (adult) (pediatric): Secondary | ICD-10-CM | POA: Insufficient documentation

## 2021-05-28 HISTORY — DX: Obstructive sleep apnea (adult) (pediatric): G47.33

## 2021-05-28 HISTORY — DX: Other forms of dyspnea: R06.09

## 2021-05-28 NOTE — Assessment & Plan Note (Signed)
[name] [name] struggles with palpitations from PACs, PVCs and SVT.  We cannot add a lot of nodal agents due to bradycardia.  We will try metoprolol 12.5mg  daily.

## 2021-05-28 NOTE — Assessment & Plan Note (Signed)
[name] [name] has exertional dyspnea.  She does not appear to be volume overloaded and has no valvular disease.  We will get a Lexiscan Myoview to assess for ischemia.

## 2021-05-28 NOTE — Assessment & Plan Note (Signed)
Continue atorvastatin

## 2021-05-28 NOTE — Assessment & Plan Note (Signed)
BP is running a little high.  Given her bradycardia and untreated sleep apnea, we will not aggressively titrate her HTN meds at this time.  Continue chlorthalidone.  Add metoprolol 12.5mg  daily.

## 2021-05-28 NOTE — Assessment & Plan Note (Signed)
Ms Blakley is symptomatic from OSA.  She is awaiting CPAP.

## 2021-06-01 ENCOUNTER — Other Ambulatory Visit: Payer: Self-pay

## 2021-06-01 ENCOUNTER — Encounter: Payer: Medicare Other | Attending: Physical Medicine & Rehabilitation | Admitting: Registered Nurse

## 2021-06-01 VITALS — BP 128/60 | HR 65 | Ht 64.0 in | Wt 217.0 lb

## 2021-06-01 DIAGNOSIS — M961 Postlaminectomy syndrome, not elsewhere classified: Secondary | ICD-10-CM | POA: Insufficient documentation

## 2021-06-01 DIAGNOSIS — M62838 Other muscle spasm: Secondary | ICD-10-CM | POA: Insufficient documentation

## 2021-06-01 DIAGNOSIS — Z5181 Encounter for therapeutic drug level monitoring: Secondary | ICD-10-CM | POA: Insufficient documentation

## 2021-06-01 DIAGNOSIS — Z79891 Long term (current) use of opiate analgesic: Secondary | ICD-10-CM | POA: Insufficient documentation

## 2021-06-01 DIAGNOSIS — M4807 Spinal stenosis, lumbosacral region: Secondary | ICD-10-CM | POA: Diagnosis not present

## 2021-06-01 DIAGNOSIS — G894 Chronic pain syndrome: Secondary | ICD-10-CM | POA: Insufficient documentation

## 2021-06-01 MED ORDER — MORPHINE SULFATE ER 15 MG PO TBCR: 15.0000 mg | EXTENDED_RELEASE_TABLET | Freq: Three times a day (TID) | ORAL | 0 refills | Status: DC

## 2021-06-01 MED ORDER — MORPHINE SULFATE 15 MG PO TABS
15.0000 mg | ORAL_TABLET | Freq: Two times a day (BID) | ORAL | 0 refills | Status: DC | PRN
Start: 2021-06-01 — End: 2021-07-21

## 2021-06-01 NOTE — Progress Notes (Signed)
Subjective:    Patient ID: [name] [name], female    DOB: 08/01/1955, 65 y.o.   MRN: [id number]  HPI: [name] [name] is a 65 y.o. female whose appointment was changed to a My-Chart Visit, she called office reporting nasal congestion and not feeling well. Her appointment was changed to a My-Chart Video visit, she agrees with My-Chart Video Visit and verbalizes understanding. She states her  pain is located in her lower back.She  rates her pain 5. Her current exercise regime is walking and performing stretching exercises.  [name] Morphine equivalent is 75.00 MME.   Last UDS was Performed on 02/19/2021, it was consistent.     Pain Inventory Average Pain 5 Pain Right Now 5 My pain is intermittent, sharp, and aching  In the last 24 hours, has pain interfered with the following? General activity 5 Relation with others 6 Enjoyment of life 6 What TIME of day is your pain at its worst? morning , daytime, and night Sleep (in general) Poor  Pain is worse with: walking, inactivity, and standing Pain improves with: medication and spinal cord stimulator Relief from Meds: 1  Family History  Problem Relation Age of Onset   Hyperlipidemia Mother    Heart attack Mother    Heart disease Mother 61       CHF; CAD   Hypertension Mother    Heart failure Mother    Diabetes Sister    Lupus Sister    Kidney failure Sister    Hyperlipidemia Sister    Hypertension Sister    Hypertension Brother    Stroke Maternal Aunt    Colon cancer Neg Hx    Rectal cancer Neg Hx    Social History   Socioeconomic History   Marital status: Widowed    Spouse name: Not on file   Number of children: 3   Years of education: Not on file   Highest education level: Not on file  Occupational History   Occupation: disability    Comment: 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  Tobacco Use   Smoking status: Former    Packs/day: 0.50    Years: 20.00    Pack years: 10.00    Types: Cigarettes    Quit date: 07/21/2008     Years since quitting: 12.8   Smokeless tobacco: Never  Vaping Use   Vaping Use: Never used  Substance and Sexual Activity   Alcohol use: No   Drug use: No   Sexual activity: Not on file  Other Topics Concern   Not on file  Social History Narrative   Marital status: widowed since 2002; not dating which is bad in 2018      Children: 3 children (45, 24, 40); 13 grandchildren; 1 gg      Employment: disability since 2009; retail      Lives: alone in [place] in apartment      Tobacco: quit in 2011      Alcohol: none      Drugs: none      Exercise: minimal in 2018      ADLs: independent with ADLs; drives      Seatbelt: 100%; no texting.   Social Determinants of Health   Financial Resource Strain: Not on file  Food Insecurity: Not on file  Transportation Needs: Not on file  Physical Activity: Not on file  Stress: Not on file  Social Connections: Not on file   Past Surgical History:  Procedure Laterality Date   ABDOMINAL HYSTERECTOMY  1982   DUB; ovaries intact   bladder surg  1992   bladder retraction   BREAST BIOPSY     BREAST REDUCTION SURGERY  1984   CARDIAC CATHETERIZATION  [place]   COLON SURGERY  05/11/11   [facility] Lysis of adhesions Crohn's   MASS EXCISION Left 01/03/2018   Procedure: EXCISION LEFT UPPER BACK LIPOMA ERAS PATH;  Surgeon: Erroll Luna, MD;  Location: [facility];  Service: General;  Laterality: Left;   REDUCTION MAMMAPLASTY     SPINE SURGERY     5 total (1 upper, 4 lumbar)   SPINE SURGERY     nerve stimulator on right side   Past Surgical History:  Procedure Laterality Date   ABDOMINAL HYSTERECTOMY  1982   DUB; ovaries intact   bladder surg  1992   bladder retraction   BREAST BIOPSY     BREAST REDUCTION SURGERY  1984   CARDIAC CATHETERIZATION  1990's   [name]   COLON SURGERY  05/11/11   [facility] Lysis of adhesions Crohn's   MASS EXCISION Left 01/03/2018   Procedure: EXCISION LEFT UPPER BACK LIPOMA ERAS PATH;   Surgeon: Erroll Luna, MD;  Location: [facility];  Service: General;  Laterality: Left;   REDUCTION MAMMAPLASTY     SPINE SURGERY     5 total (1 upper, 4 lumbar)   SPINE SURGERY     nerve stimulator on right side   Past Medical History:  Diagnosis Date   Anemia    Blood transfusion without reported diagnosis    Crohn's disease ([place])    Disorder of sacroiliac joint    Exertional dyspnea 05/28/2021   Hyperlipidemia    Hypertension    Ischemic colitis ([place])    Lumbago    Lumbar post-laminectomy syndrome    Lumbosacral neuritis    Lumbosacral radiculitis    OSA (obstructive sleep apnea) 05/28/2021   Sciatica    There were no vitals taken for this visit.  Opioid Risk Score:   Fall Risk Score:  `1  Depression screen PHQ 2/9  Depression screen [facility] 2/9 06/01/2021 04/21/2021 03/23/2021 03/17/2021 02/19/2021 12/24/2020 12/22/2020  Decreased Interest 0 0 0 1 0 2 0  Down, Depressed, Hopeless 0 0 0 0 0 0 0  PHQ - 2 Score 0 0 0 1 0 2 0  Altered sleeping - - - 0 - 1 -  Tired, decreased energy - - - 2 - 3 -  Change in appetite - - - 3 - 3 -  Feeling bad or failure about yourself  - - - 0 - 0 -  Trouble concentrating - - - 0 - 0 -  Moving slowly or fidgety/restless - - - 0 - 0 -  Suicidal thoughts - - - 0 - 0 -  PHQ-9 Score - - - 6 - 9 -  Difficult doing work/chores - - - Not difficult at all - - -  Some recent data might be hidden     Review of Systems  Constitutional: Negative.   HENT: Negative.    Eyes: Negative.   Respiratory: Negative.    Cardiovascular: Negative.   Gastrointestinal: Negative.   Endocrine: Negative.   Genitourinary: Negative.   Musculoskeletal:  Positive for back pain.  Skin: Negative.   Allergic/Immunologic: Negative.   Neurological:  Positive for dizziness and weakness.  Hematological: Negative.   Psychiatric/Behavioral:  Positive for sleep disturbance.       Objective:   Physical  Exam Vitals and nursing note reviewed.  Musculoskeletal:      Comments: No Physical Exam Performed: Telephone Visit         Assessment & Plan:  1.Lumbar Postlaminectomy/ Spinal Stenosis Lumbar Region/ Post-Op Pain/Low back pain with radiating symptoms in L3-4 distribution:  continues to be limited by pain. Refilled:  MSIR 15 mg one tablet twice a day as needed for moderate to sever pain #60 and  Morphine Sulfate ER 15 mg Q 8 hours. 06/01/2021. We will continue the opioid monitoring program, this consists of regular clinic visits, examinations, urine drug screen, pill counts as well as use of [place] Controlled Substance Reporting system. A 12 month History has been reviewed on the [place] Controlled Substance Reporting System on 02/19/2021. Encouraged to Continue exercise regime.   2. Lumbar Radiculopathy: No complaints today. Continue Topamax:.Continue to Monitor 06/01/2021 3.Chronic  Right Knee Pain: No complaints today.  Continue with Ice Therapy and  Voltaren Gel. 06/01/2021 4. Muscle Spasm: Continue current treatment: Tizanidine. 06/01/2021  5. Lipoma: S/P Left  Lipoma Excision of upper back  By Dr. Brantley Stage on 01/03/2018. Surgery Following. Continue to Monitor. 06/01/2021.   6. Paresthesia of Skin: Continue Topamax. Continue to monitor. 06/01/2021 7. Adhesive Capsulitis of Right Shoulder:Right Shoulder Tendonitis Pain:  S/P Right Shoulder Cortisone Injection with Dr Letta Pate, with good relief noted. Continue to monitor. Continue HEP as Tolerated. 06/01/2021  8. Polyarthralgia: Continue HEP as Tolerated. Continue to Monitor.06/01/2021    F/U in 1 month  Telephone Visit: Unable to access My Chart Established Patient Location of Patient: In Her Home Location of Provider: In the Office  Total Time Spent: 10 Minutes

## 2021-06-04 ENCOUNTER — Encounter (HOSPITAL_BASED_OUTPATIENT_CLINIC_OR_DEPARTMENT_OTHER): Payer: Self-pay | Admitting: Cardiovascular Disease

## 2021-06-04 NOTE — Procedures (Signed)
Patient Name: [name] [name], [name] [name] Date: 05/16/2021 Gender: Female D.O.B: Jan 11, 1956 Age (years): 65 Referring Provider: Skeet Latch Height (inches): 64 Interpreting Physician: Shelva Majestic MD, ABSM Weight (lbs): 218 RPSGT: Earney Hamburg BMI: 37 MRN: [id number] Neck Size: 16.00  CLINICAL INFORMATION The patient is referred for a CPAP titration to treat sleep apnea.  Date of NPSG: 03/24/2021: AHI 7.8/h; RDI 8.4/h; REM AHI 33.8/h; O2 nadir 83%.  SLEEP STUDY TECHNIQUE As per the AASM Manual for the Scoring of Sleep and Associated Events v2.3 (April 2016) with a hypopnea requiring 4% desaturations.  The channels recorded and monitored were frontal, central and occipital EEG, electrooculogram (EOG), submentalis EMG (chin), nasal and oral airflow, thoracic and abdominal wall motion, anterior tibialis EMG, snore microphone, electrocardiogram, and pulse oximetry. Continuous positive airway pressure (CPAP) was initiated at the beginning of the study and titrated to treat sleep-disordered breathing.  MEDICATIONS atorvastatin (LIPITOR) 20 MG tablet chlorthalidone (HYGROTON) 25 MG tablet Diclofenac Sodium 3 % GEL fluticasone (FLONASE) 50 MCG/ACT nasal spray metoprolol succinate (TOPROL XL) 25 MG 24 hr tablet morphine (MS CONTIN) 15 MG 12 hr tablet morphine (MSIR) 15 MG tablet naloxone (NARCAN) nasal spray 4 mg/0.1 mL potassium chloride (KLOR-CON) 20 MEQ packet tiZANidine (ZANAFLEX) 2 MG tablet Medications self-administered by patient taken the night of the study : TIZANIDINE  TECHNICIAN COMMENTS Comments added by technician: none Comments added by scorer: N/A  RESPIRATORY PARAMETERS Optimal PAP Pressure (cm): 12 AHI at Optimal Pressure (/hr): 2 Overall Minimal O2 (%): 92.0 Supine % at Optimal Pressure (%): 16 Minimal O2 at Optimal Pressure (%): 92.0   SLEEP ARCHITECTURE The study was initiated at 9:37:45 PM and ended at 4:23:25 AM.  Sleep onset time was  15.4 minutes and the sleep efficiency was 72.8%%. The total sleep time was 295.5 minutes.  The patient spent 1.7%% of the night in stage N1 sleep, 87.3%% in stage N2 sleep, 0.0%% in stage N3 and 11% in REM.Stage REM latency was 183.0 minutes  Wake after sleep onset was 94.8. Alpha intrusion was absent. Supine sleep was 82.57%.  CARDIAC DATA The 2 lead EKG demonstrated sinus rhythm. The mean heart rate was 51.5 beats per minute. Other EKG findings include: None.  LEG MOVEMENT DATA The total Periodic Limb Movements of Sleep (PLMS) were 0. The PLMS index was 0.0. A PLMS index of <15 is considered normal in adults.  IMPRESSIONS - CPAP was initiated at 6 cm and was titrated to 12 cm of water (AHI 2.0/h; RDI 2.0/h; O2 nadir 92%.) - No oxygen desaturations were observed during this titration with O2 nadir 92%. - The patient snored with moderate snoring volume during this titration study. - No cardiac abnormalities were observed during this study. - Clinically significant periodic limb movements were not noted during this study. Arousals associated with PLMs were rare.  DIAGNOSIS - Obstructive Sleep Apnea (G47.33)  RECOMMENDATIONS - Recommend an initial trial of CPAP Auto therapy with EPR pf 3 at 11- 16  cm H2O with heated humidification. A Small size Fisher&Paykel Full Face Mask Simplus mask was used for the titration. - Effort should be made to optimize nasal and oropharyngeal patency. - Avoid alcohol, sedatives and other CNS depressants that may worsen sleep apnea and disrupt normal sleep architecture. - Sleep hygiene should be reviewed to assess factors that may improve sleep quality. - Weight management and regular exercise should be initiated or continued. - Recommend a download and sleep clinic evaluation after 4 weeks of therapy   [Electronically  signed] 06/04/2021 08:20 AM  Shelva Majestic MD, [facility], [place], American Board of Sleep Medicine   NPI: [id number] [place] PH: [phone]   FX: [phone] [place]

## 2021-06-07 ENCOUNTER — Encounter: Payer: Self-pay | Admitting: Registered Nurse

## 2021-06-09 ENCOUNTER — Telehealth: Payer: Self-pay | Admitting: *Deleted

## 2021-06-09 ENCOUNTER — Encounter (HOSPITAL_COMMUNITY): Payer: Self-pay | Admitting: *Deleted

## 2021-06-09 ENCOUNTER — Telehealth (HOSPITAL_COMMUNITY): Payer: Self-pay | Admitting: *Deleted

## 2021-06-09 NOTE — Telephone Encounter (Signed)
-----   Message from [name] Sine, MD sent at 06/04/2021  8:26 AM EST ----- [name], please notify pt and set up with DME for CPAP

## 2021-06-09 NOTE — Telephone Encounter (Signed)
Patient given detailed instructions per Myocardial Perfusion Study Information Sheet for the test on 06/16/21 at 1000. Patient notified to arrive 15 minutes early and that it is imperative to arrive on time for appointment to keep from having the test rescheduled.  If you need to cancel or reschedule your appointment, please call the office within 24 hours of your appointment. . Patient verbalized understanding.Short Mychart letter sent

## 2021-06-09 NOTE — Telephone Encounter (Signed)
Patient notified CPAP titration has been completed. Order for CPAP has been sent to [facility].

## 2021-06-16 ENCOUNTER — Other Ambulatory Visit: Payer: Self-pay

## 2021-06-16 ENCOUNTER — Ambulatory Visit (HOSPITAL_COMMUNITY): Payer: Medicare Other | Attending: Cardiovascular Disease

## 2021-06-16 DIAGNOSIS — R0609 Other forms of dyspnea: Secondary | ICD-10-CM | POA: Diagnosis present

## 2021-06-16 LAB — MYOCARDIAL PERFUSION IMAGING
LV dias vol: 87 mL (ref 46–106)
LV sys vol: 28 mL
Nuc Stress EF: 67 %
Peak HR: 78 {beats}/min
Rest HR: 50 {beats}/min
Rest Nuclear Isotope Dose: 8.1 mCi
SDS: 3
SRS: 0
SSS: 3
ST Depression (mm): 0 mm
Stress Nuclear Isotope Dose: 30.1 mCi
TID: 1.16

## 2021-06-16 MED ORDER — TECHNETIUM TC 99M TETROFOSMIN IV KIT
30.1000 | PACK | Freq: Once | INTRAVENOUS | Status: AC | PRN
  Administered 2021-06-16: 30.1 via INTRAVENOUS
  Filled 2021-06-16: qty 31

## 2021-06-16 MED ORDER — TECHNETIUM TC 99M TETROFOSMIN IV KIT
8.1000 | PACK | Freq: Once | INTRAVENOUS | Status: AC | PRN
  Administered 2021-06-16: 10:00:00 8.1 via INTRAVENOUS
  Filled 2021-06-16: qty 9

## 2021-06-16 MED ORDER — REGADENOSON 0.4 MG/5ML IV SOLN
0.4000 mg | Freq: Once | INTRAVENOUS | Status: AC
  Administered 2021-06-16: 0.4 mg via INTRAVENOUS

## 2021-06-22 ENCOUNTER — Other Ambulatory Visit: Payer: Self-pay | Admitting: Registered Nurse

## 2021-06-22 ENCOUNTER — Encounter: Payer: Self-pay | Admitting: Registered Nurse

## 2021-06-22 ENCOUNTER — Other Ambulatory Visit: Payer: Self-pay

## 2021-06-22 ENCOUNTER — Encounter: Payer: Medicare Other | Attending: Physical Medicine & Rehabilitation | Admitting: Registered Nurse

## 2021-06-22 VITALS — BP 119/70 | HR 52 | Temp 98.9°F | Ht 64.0 in | Wt 216.0 lb

## 2021-06-22 DIAGNOSIS — G894 Chronic pain syndrome: Secondary | ICD-10-CM | POA: Diagnosis not present

## 2021-06-22 DIAGNOSIS — M4807 Spinal stenosis, lumbosacral region: Secondary | ICD-10-CM | POA: Insufficient documentation

## 2021-06-22 DIAGNOSIS — Z79891 Long term (current) use of opiate analgesic: Secondary | ICD-10-CM | POA: Insufficient documentation

## 2021-06-22 DIAGNOSIS — Z5181 Encounter for therapeutic drug level monitoring: Secondary | ICD-10-CM | POA: Diagnosis not present

## 2021-06-22 DIAGNOSIS — M961 Postlaminectomy syndrome, not elsewhere classified: Secondary | ICD-10-CM | POA: Insufficient documentation

## 2021-06-22 DIAGNOSIS — M62838 Other muscle spasm: Secondary | ICD-10-CM | POA: Diagnosis not present

## 2021-06-22 DIAGNOSIS — R001 Bradycardia, unspecified: Secondary | ICD-10-CM | POA: Insufficient documentation

## 2021-06-22 NOTE — Progress Notes (Signed)
Subjective:    Patient ID: [name] [name], female    DOB: 1955/11/05, 67 y.o.   MRN: [id number]  HPI: [name] [name] is a 66 y.o. female who returns for follow up appointment for chronic pain and medication refill. She states her pain is located in her lower back. She rates her pain 5. Her current exercise regime is walking and performing stretching exercises.  [name] [name] arrived to office bradycardic, apical puls was checked. [name] [name] states Cardiology following.   [name] [name] Morphine equivalent is 75.00 MME.   UDS ordered today.      Pain Inventory Average Pain 5 Pain Right Now 5 My pain is intermittent, sharp, stabbing, and aching  In the last 24 hours, has pain interfered with the following? General activity 6 Relation with others 7 Enjoyment of life 7 What TIME of day is your pain at its worst? morning , daytime, evening, and night Sleep (in general) Poor  Pain is worse with: walking, inactivity, and standing Pain improves with: rest, heat/ice, and medication Relief from Meds: 1  Family History  Problem Relation Age of Onset   Hyperlipidemia Mother    Heart attack Mother    Heart disease Mother 35       CHF; CAD   Hypertension Mother    Heart failure Mother    Diabetes Sister    Lupus Sister    Kidney failure Sister    Hyperlipidemia Sister    Hypertension Sister    Hypertension Brother    Stroke Maternal Aunt    Colon cancer Neg Hx    Rectal cancer Neg Hx    Social History   Socioeconomic History   Marital status: Widowed    Spouse name: Not on file   Number of children: 3   Years of education: Not on file   Highest education level: Not on file  Occupational History   Occupation: disability    Comment: 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  Tobacco Use   Smoking status: Former    Packs/day: 0.50    Years: 20.00    Pack years: 10.00    Types: Cigarettes    Quit date: 07/21/2008    Years since quitting: 12.9   Smokeless tobacco: Never  Vaping Use    Vaping Use: Never used  Substance and Sexual Activity   Alcohol use: No   Drug use: No   Sexual activity: Not on file  Other Topics Concern   Not on file  Social History Narrative   Marital status: widowed since 2002; not dating which is bad in 2018      Children: 3 children (45, 46, 40); 13 grandchildren; 1 gg      Employment: disability since 2009; retail      Lives: alone in [place] in apartment      Tobacco: quit in 2011      Alcohol: none      Drugs: none      Exercise: minimal in 2018      ADLs: independent with ADLs; drives      Seatbelt: 100%; no texting.   Social Determinants of Health   Financial Resource Strain: Not on file  Food Insecurity: Not on file  Transportation Needs: Not on file  Physical Activity: Not on file  Stress: Not on file  Social Connections: Not on file   Past Surgical History:  Procedure Laterality Date   ABDOMINAL HYSTERECTOMY  1982   DUB; ovaries intact   bladder surg  1992  bladder retraction   BREAST BIOPSY     BREAST REDUCTION SURGERY  1984   CARDIAC CATHETERIZATION  27's   [name] [name]   COLON SURGERY  05/11/11   [facility] Lysis of adhesions Crohn's   MASS EXCISION Left 01/03/2018   Procedure: EXCISION LEFT UPPER BACK LIPOMA ERAS PATH;  Surgeon: Erroll Luna, MD;  Location: [facility];  Service: General;  Laterality: Left;   REDUCTION MAMMAPLASTY     SPINE SURGERY     5 total (1 upper, 4 lumbar)   SPINE SURGERY     nerve stimulator on right side   Past Surgical History:  Procedure Laterality Date   ABDOMINAL HYSTERECTOMY  1982   DUB; ovaries intact   bladder surg  1992   bladder retraction   BREAST BIOPSY     BREAST REDUCTION SURGERY  1984   CARDIAC CATHETERIZATION  1990's   [name] [name]   COLON SURGERY  05/11/11   [facility] Lysis of adhesions Crohn's   MASS EXCISION Left 01/03/2018   Procedure: EXCISION LEFT UPPER BACK LIPOMA ERAS PATH;  Surgeon: Erroll Luna, MD;  Location: [facility];   Service: General;  Laterality: Left;   REDUCTION MAMMAPLASTY     SPINE SURGERY     5 total (1 upper, 4 lumbar)   SPINE SURGERY     nerve stimulator on right side   Past Medical History:  Diagnosis Date   Anemia    Blood transfusion without reported diagnosis    Crohn's disease ([place])    Disorder of sacroiliac joint    Exertional dyspnea 05/28/2021   Hyperlipidemia    Hypertension    Ischemic colitis ([place])    Lumbago    Lumbar post-laminectomy syndrome    Lumbosacral neuritis    Lumbosacral radiculitis    OSA (obstructive sleep apnea) 05/28/2021   Sciatica    BP 119/70    Pulse (!) 52    Temp 98.9 F (37.2 C)    Ht 5\' 4"  (1.626 m)    Wt 216 lb (98 kg)    SpO2 100%    BMI 37.08 kg/m   Opioid Risk Score:   Fall Risk Score:  `1  Depression screen PHQ 2/9  Depression screen [facility] 2/9 06/01/2021 04/21/2021 03/23/2021 03/17/2021 02/19/2021 12/24/2020 12/22/2020  Decreased Interest 0 0 0 1 0 2 0  Down, Depressed, Hopeless 0 0 0 0 0 0 0  PHQ - 2 Score 0 0 0 1 0 2 0  Altered sleeping - - - 0 - 1 -  Tired, decreased energy - - - 2 - 3 -  Change in appetite - - - 3 - 3 -  Feeling bad or failure about yourself  - - - 0 - 0 -  Trouble concentrating - - - 0 - 0 -  Moving slowly or fidgety/restless - - - 0 - 0 -  Suicidal thoughts - - - 0 - 0 -  PHQ-9 Score - - - 6 - 9 -  Difficult doing work/chores - - - Not difficult at all - - -  Some recent data might be hidden    Review of Systems  Musculoskeletal:  Positive for back pain.       Right shoulder pain  All other systems reviewed and are negative.     Objective:   Physical Exam Vitals and nursing note reviewed.  Constitutional:      Appearance: Normal appearance.  Cardiovascular:     Rate and Rhythm:  Bradycardia present.     Pulses: Normal pulses.     Heart sounds: Normal heart sounds.  Pulmonary:     Effort: Pulmonary effort is normal.     Breath sounds: Normal breath sounds.  Musculoskeletal:     Cervical back: Normal range  of motion and neck supple.     Comments: Normal Muscle Bulk and Muscle Testing Reveals:  Upper Extremities: Full ROM and Muscle Strength 5/5 Lumbar Paraspinal Tenderness: L-4-L-5 Lower Extremities: Decreased ROM and Muscle Strength 5/5 Bilateral Lower Extremities Flexion Produces Pain into her  Patellas Arises from Table Slowly Narrow Based Gait     Skin:    General: Skin is warm and dry.  Neurological:     Mental Status: She is alert and oriented to person, place, and time.  Psychiatric:        Mood and Affect: Mood normal.        Behavior: Behavior normal.         Assessment & Plan:  1.Lumbar Postlaminectomy/ Spinal Stenosis Lumbar Region/ Post-Op Pain/Low back pain with radiating symptoms in L3-4 distribution:  continues to be limited by pain. Refilled:  MSIR 15 mg one tablet twice a day as needed for moderate to sever pain #60 and  Morphine Sulfate ER 15 mg Q 8 hours. 06/22/2021. We will continue the opioid monitoring program, this consists of regular clinic visits, examinations, urine drug screen, pill counts as well as use of [place] Controlled Substance Reporting system. A 12 month History has been reviewed on the [place] Controlled Substance Reporting System on 02/19/2021. Encouraged to Continue exercise regime.   2. Lumbar Radiculopathy: No complaints today. Continue Topamax:.Continue to Monitor 06/22/2021 3.Chronic  Right Knee Pain: No complaints today.  Continue with Ice Therapy and  Voltaren Gel. 06/22/2021 4. Muscle Spasm: Continue current treatment: Tizanidine. 06/22/2021  5. Lipoma: S/P Left  Lipoma Excision of upper back  By Dr. Brantley Stage on 01/03/2018. Surgery Following. Continue to Monitor. 06/22/2021.   6. Paresthesia of Skin: Continue Topamax. Continue to monitor. 06/22/2021 7. Adhesive Capsulitis of Right Shoulder:Right Shoulder Tendonitis Pain:  S/P Right Shoulder Cortisone Injection with Dr Letta Pate, with good relief noted. Continue to monitor.  Continue HEP as Tolerated. 06/22/2021  8. Polyarthralgia: Continue HEP as Tolerated. Continue to Monitor.06/22/2021  9. Bradycardia: Apical Pulse. [name] states Cardiology is following.    F/U in 1 month

## 2021-06-26 LAB — TOXASSURE SELECT,+ANTIDEPR,UR

## 2021-06-28 DIAGNOSIS — Z79891 Long term (current) use of opiate analgesic: Secondary | ICD-10-CM | POA: Diagnosis not present

## 2021-06-28 DIAGNOSIS — M545 Low back pain, unspecified: Secondary | ICD-10-CM | POA: Diagnosis not present

## 2021-06-28 DIAGNOSIS — Z5181 Encounter for therapeutic drug level monitoring: Secondary | ICD-10-CM | POA: Diagnosis not present

## 2021-07-02 ENCOUNTER — Telehealth: Payer: Self-pay | Admitting: *Deleted

## 2021-07-02 NOTE — Telephone Encounter (Signed)
Urine drug screen for this encounter is consistent for prescribed medication 

## 2021-07-03 ENCOUNTER — Other Ambulatory Visit: Payer: Self-pay | Admitting: Family Medicine

## 2021-07-03 DIAGNOSIS — I10 Essential (primary) hypertension: Secondary | ICD-10-CM

## 2021-07-15 ENCOUNTER — Telehealth: Payer: Self-pay

## 2021-07-15 MED ORDER — MORPHINE SULFATE ER 15 MG PO TBCR: 15.0000 mg | EXTENDED_RELEASE_TABLET | Freq: Three times a day (TID) | ORAL | 0 refills | Status: DC

## 2021-07-15 NOTE — Telephone Encounter (Signed)
PMP was Reviewed.  MS Contin e-scribed today, with a post fill date.  Placed a call to Ms. Piacentini regarding the above, she verbalizes understanding.

## 2021-07-15 NOTE — Telephone Encounter (Signed)
Patient called stating she understands its too early to fill or Morphine Sulfate ER but the pharmacy is telling her to have the provider to send prescription so they can order it and it will be at pharmacy when its time for her to refill. [facility]

## 2021-07-21 ENCOUNTER — Other Ambulatory Visit: Payer: Self-pay

## 2021-07-21 ENCOUNTER — Encounter: Payer: Self-pay | Admitting: Registered Nurse

## 2021-07-21 ENCOUNTER — Encounter: Payer: Medicare Other | Attending: Physical Medicine & Rehabilitation | Admitting: Registered Nurse

## 2021-07-21 VITALS — BP 130/81 | HR 60 | Ht 64.0 in | Wt 216.0 lb

## 2021-07-21 DIAGNOSIS — G894 Chronic pain syndrome: Secondary | ICD-10-CM | POA: Diagnosis not present

## 2021-07-21 DIAGNOSIS — M961 Postlaminectomy syndrome, not elsewhere classified: Secondary | ICD-10-CM | POA: Insufficient documentation

## 2021-07-21 DIAGNOSIS — Z5181 Encounter for therapeutic drug level monitoring: Secondary | ICD-10-CM | POA: Diagnosis not present

## 2021-07-21 DIAGNOSIS — Z79891 Long term (current) use of opiate analgesic: Secondary | ICD-10-CM | POA: Diagnosis not present

## 2021-07-21 DIAGNOSIS — R001 Bradycardia, unspecified: Secondary | ICD-10-CM | POA: Diagnosis not present

## 2021-07-21 DIAGNOSIS — M4807 Spinal stenosis, lumbosacral region: Secondary | ICD-10-CM | POA: Insufficient documentation

## 2021-07-21 DIAGNOSIS — M62838 Other muscle spasm: Secondary | ICD-10-CM | POA: Insufficient documentation

## 2021-07-21 HISTORY — PX: OTHER SURGICAL HISTORY: SHX169

## 2021-07-21 MED ORDER — MORPHINE SULFATE 15 MG PO TABS: 15.0000 mg | ORAL_TABLET | Freq: Two times a day (BID) | ORAL | 0 refills | Status: DC | PRN

## 2021-07-21 NOTE — Progress Notes (Signed)
Subjective:    Patient ID: [name] [name], female    DOB: 1956-06-16, 66 y.o.   MRN: [id number]  HPI: [name] [name] is a 66 y.o. female who returns for follow up appointment for chronic pain and medication refill. She states her pain is located in her lower back.She  rates her pain 5. Her current exercise regime is walking and performing stretching exercises.  Ms. [place] Morphine equivalent is  75.00 MME.   Last UDS was Performed on 06/22/2020, it was consistent.     Pain Inventory Average Pain 5 Pain Right Now 5 My pain is constant, sharp, and aching  In the last 24 hours, has pain interfered with the following? General activity 6 Relation with others 6 Enjoyment of life 6 What TIME of day is your pain at its worst? morning , daytime, evening, and night Sleep (in general) Poor  Pain is worse with: walking, inactivity, and standing Pain improves with: rest, heat/ice, and medication Relief from Meds: 1  Family History  Problem Relation Age of Onset   Hyperlipidemia Mother    Heart attack Mother    Heart disease Mother 56       CHF; CAD   Hypertension Mother    Heart failure Mother    Diabetes Sister    Lupus Sister    Kidney failure Sister    Hyperlipidemia Sister    Hypertension Sister    Hypertension Brother    Stroke Maternal Aunt    Colon cancer Neg Hx    Rectal cancer Neg Hx    Social History   Socioeconomic History   Marital status: Widowed    Spouse name: Not on file   Number of children: 3   Years of education: Not on file   Highest education level: Not on file  Occupational History   Occupation: disability    Comment: 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  Tobacco Use   Smoking status: Former    Packs/day: 0.50    Years: 20.00    Pack years: 10.00    Types: Cigarettes    Quit date: 07/21/2008    Years since quitting: 13.0   Smokeless tobacco: Never  Vaping Use   Vaping Use: Never used  Substance and Sexual Activity   Alcohol use: No   Drug use: No    Sexual activity: Not on file  Other Topics Concern   Not on file  Social History Narrative   Marital status: widowed since 2002; not dating which is bad in 2018      Children: 3 children (45, 35, 40); 13 grandchildren; 1 gg      Employment: disability since 2009; retail      Lives: alone in [place] in apartment      Tobacco: quit in 2011      Alcohol: none      Drugs: none      Exercise: minimal in 2018      ADLs: independent with ADLs; drives      Seatbelt: 100%; no texting.   Social Determinants of Health   Financial Resource Strain: Not on file  Food Insecurity: Not on file  Transportation Needs: Not on file  Physical Activity: Not on file  Stress: Not on file  Social Connections: Not on file   Past Surgical History:  Procedure Laterality Date   ABDOMINAL HYSTERECTOMY  1982   DUB; ovaries intact   bladder surg  1992   bladder retraction   BREAST BIOPSY  BREAST REDUCTION SURGERY  1984   CARDIAC CATHETERIZATION  [place]   COLON SURGERY  05/11/11   [facility] Lysis of adhesions Crohn's   MASS EXCISION Left 01/03/2018   Procedure: EXCISION LEFT UPPER BACK LIPOMA ERAS PATH;  Surgeon: Erroll Luna, MD;  Location: [place];  Service: General;  Laterality: Left;   REDUCTION MAMMAPLASTY     SPINE SURGERY     5 total (1 upper, 4 lumbar)   SPINE SURGERY     nerve stimulator on right side   Past Surgical History:  Procedure Laterality Date   ABDOMINAL HYSTERECTOMY  1982   DUB; ovaries intact   bladder surg  1992   bladder retraction   BREAST BIOPSY     BREAST REDUCTION SURGERY  1984   CARDIAC CATHETERIZATION  1990's   [place]  05/11/11   [facility] Lysis of adhesions Crohn's   MASS EXCISION Left 01/03/2018   Procedure: EXCISION LEFT UPPER BACK LIPOMA ERAS PATH;  Surgeon: Erroll Luna, MD;  Location: [place];  Service: General;  Laterality: Left;   REDUCTION MAMMAPLASTY     SPINE SURGERY     5 total (1  upper, 4 lumbar)   SPINE SURGERY     nerve stimulator on right side   Past Medical History:  Diagnosis Date   Anemia    Blood transfusion without reported diagnosis    Crohn's disease ([place])    Disorder of sacroiliac joint    Exertional dyspnea 05/28/2021   Hyperlipidemia    Hypertension    Ischemic colitis ([place])    Lumbago    Lumbar post-laminectomy syndrome    Lumbosacral neuritis    Lumbosacral radiculitis    OSA (obstructive sleep apnea) 05/28/2021   Sciatica    BP 130/81    Pulse (!) 58    Ht 5\' 4"  (1.626 m)    Wt 216 lb (98 kg)    SpO2 97%    BMI 37.08 kg/m   Opioid Risk Score:   Fall Risk Score:  `1  Depression screen PHQ 2/9  Depression screen [facility] 2/9 07/21/2021 06/22/2021 06/01/2021 04/21/2021 03/23/2021 03/17/2021 02/19/2021  Decreased Interest 1 0 0 0 0 1 0  Down, Depressed, Hopeless 0 0 0 0 0 0 0  PHQ - 2 Score 1 0 0 0 0 1 0  Altered sleeping - - - - - 0 -  Tired, decreased energy - - - - - 2 -  Change in appetite - - - - - 3 -  Feeling bad or failure about yourself  - - - - - 0 -  Trouble concentrating - - - - - 0 -  Moving slowly or fidgety/restless - - - - - 0 -  Suicidal thoughts - - - - - 0 -  PHQ-9 Score - - - - - 6 -  Difficult doing work/chores - - - - - Not difficult at all -  Some recent data might be hidden     Review of Systems  Musculoskeletal:  Positive for back pain.       Right shoulder pain  All other systems reviewed and are negative.     Objective:   Physical Exam Vitals and nursing note reviewed.  Constitutional:      Appearance: Normal appearance.  Cardiovascular:     Rate and Rhythm: Normal rate and regular rhythm.     Pulses: Normal pulses.     Heart  sounds: Normal heart sounds.  Pulmonary:     Effort: Pulmonary effort is normal.     Breath sounds: Normal breath sounds.  Musculoskeletal:     Cervical back: Normal range of motion and neck supple.     Comments: Normal Muscle Bulk and Muscle Testing Reveals:  Upper Extremities:  Full ROM and Muscle Strength 5/5  Lumbar Paraspinal Tenderness: L-4-L-5 Right Greater Trochanter Tenderness Lower Extremities : Full ROM and Muscle Strength 5/5 Arises from Table slowly Narrow Based Gait     Skin:    General: Skin is warm and dry.  Neurological:     Mental Status: She is alert and oriented to person, place, and time.  Psychiatric:        Mood and Affect: Mood normal.        Behavior: Behavior normal.         Assessment & Plan:  1.Lumbar Postlaminectomy/ Spinal Stenosis Lumbar Region/ Post-Op Pain/Low back pain with radiating symptoms in L3-4 distribution:  continues to be limited by pain. Refilled:  MSIR 15 mg one tablet twice a day as needed for moderate to sever pain #60 and  Morphine Sulfate ER 15 mg Q 8 hours. 07/21/2021. We will continue the opioid monitoring program, this consists of regular clinic visits, examinations, urine drug screen, pill counts as well as use of [place] Controlled Substance Reporting system. A 12 month History has been reviewed on the [place] Controlled Substance Reporting System on 02/19/2021. Encouraged to Continue exercise regime.   2. Lumbar Radiculopathy: No complaints today. Continue Topamax:.Continue to Monitor 07/21/2021 3.Chronic  Right Knee Pain: No complaints today.  Continue with Ice Therapy and  Voltaren Gel. 07/21/2021 4. Muscle Spasm: Continue current treatment: Tizanidine. 07/21/2021  5. Lipoma: S/P Left  Lipoma Excision of upper back  By Dr. Brantley Stage on 01/03/2018. Surgery Following. Continue to Monitor. 07/21/2021.   6. Paresthesia of Skin: Continue Topamax. Continue to monitor. 07/21/2021 7. Adhesive Capsulitis of Right Shoulder:Right Shoulder Tendonitis Pain:  S/P Right Shoulder Cortisone Injection with Dr Letta Pate, with good relief noted. Continue to monitor. Continue HEP as Tolerated. 07/21/2021  8. Polyarthralgia: Continue HEP as Tolerated. Continue to Monitor.07/21/2021  9. Bradycardia: Apical Pulse  check: Pulse 60. Ms. Helbling states Cardiology is following. Continue to Monitor.    F/U in 1 month

## 2021-07-28 ENCOUNTER — Ambulatory Visit (HOSPITAL_BASED_OUTPATIENT_CLINIC_OR_DEPARTMENT_OTHER): Payer: Medicare Other | Admitting: Family

## 2021-07-29 ENCOUNTER — Ambulatory Visit (INDEPENDENT_AMBULATORY_CARE_PROVIDER_SITE_OTHER): Payer: Medicare Other | Admitting: Cardiovascular Disease

## 2021-07-29 ENCOUNTER — Other Ambulatory Visit: Payer: Self-pay

## 2021-07-29 ENCOUNTER — Encounter (HOSPITAL_BASED_OUTPATIENT_CLINIC_OR_DEPARTMENT_OTHER): Payer: Self-pay | Admitting: Cardiovascular Disease

## 2021-07-29 DIAGNOSIS — R001 Bradycardia, unspecified: Secondary | ICD-10-CM | POA: Diagnosis not present

## 2021-07-29 DIAGNOSIS — E7800 Pure hypercholesterolemia, unspecified: Secondary | ICD-10-CM

## 2021-07-29 DIAGNOSIS — I10 Essential (primary) hypertension: Secondary | ICD-10-CM
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
## 2021-07-29 NOTE — Patient Instructions (Signed)
Medication Instructions:  Your physician recommends that you continue on your current medications as directed. Please refer to the Current Medication list given to you today.   *If you need a refill on your cardiac medications before your next appointment, please call your pharmacy*   Lab Work: NONE If you have labs (blood work) drawn today and your tests are completely normal, you will receive your results only by: [place] (if you have MyChart) OR A paper copy in the mail If you have any lab test that is abnormal or we need to change your treatment, we will call you to review the results.   Testing/Procedures: NONE   Follow-Up: At [facility], you and your health needs are our priority.  As part of our continuing mission to provide you with exceptional heart care, we have created designated Provider Care Teams.  These Care Teams include your primary Cardiologist (physician) and Advanced Practice Providers (APPs -  Physician Assistants and Nurse Practitioners) who all work together to provide you with the care you need, when you need it.  We recommend signing up for the patient portal called "MyChart".  Sign up information is provided on this After Visit Summary.  MyChart is used to connect with patients for Virtual Visits (Telemedicine).  Patients are able to view lab/test results, encounter notes, upcoming appointments, etc.  Non-urgent messages can be sent to your provider as well.   To learn more about what you can do with MyChart, go to [email or web address].    Your next appointment:   6 month(s)  The format for your next appointment:   In Person  Provider:   Skeet Latch, MD{  Other Instructions CANCELLED [place]

## 2021-07-29 NOTE — Assessment & Plan Note (Signed)
Stable on low-dose metoprolol.  She is asymptomatic.  We will not titrate her dose.  Palpitations are better controlled with low-dose metoprolol.  We did discuss fact that she has a bad day she can take an extra half tablet.

## 2021-07-29 NOTE — Assessment & Plan Note (Signed)
Blood pressure is well-controlled on chlorthalidone and metoprolol.   

## 2021-07-29 NOTE — Assessment & Plan Note (Signed)
Continue atorvastatin.  She is unable to increase her exercise due to chronic pain.

## 2021-08-16 ENCOUNTER — Other Ambulatory Visit: Payer: Self-pay | Admitting: Family Medicine

## 2021-08-18 ENCOUNTER — Other Ambulatory Visit: Payer: Self-pay

## 2021-08-18 ENCOUNTER — Encounter: Payer: Self-pay | Admitting: Registered Nurse

## 2021-08-18 ENCOUNTER — Encounter: Payer: Medicare Other | Attending: Physical Medicine & Rehabilitation | Admitting: Registered Nurse

## 2021-08-18 VITALS — BP 147/79 | HR 56 | Ht 64.0 in | Wt 218.0 lb

## 2021-08-18 DIAGNOSIS — Z5181 Encounter for therapeutic drug level monitoring: Secondary | ICD-10-CM | POA: Diagnosis not present

## 2021-08-18 DIAGNOSIS — G894 Chronic pain syndrome: Secondary | ICD-10-CM | POA: Insufficient documentation

## 2021-08-18 DIAGNOSIS — M62838 Other muscle spasm: Secondary | ICD-10-CM | POA: Insufficient documentation

## 2021-08-18 DIAGNOSIS — Z79891 Long term (current) use of opiate analgesic: Secondary | ICD-10-CM | POA: Insufficient documentation

## 2021-08-18 DIAGNOSIS — R001 Bradycardia, unspecified: Secondary | ICD-10-CM | POA: Insufficient documentation

## 2021-08-18 DIAGNOSIS — M4807 Spinal stenosis, lumbosacral region: Secondary | ICD-10-CM | POA: Diagnosis not present

## 2021-08-18 DIAGNOSIS — M961 Postlaminectomy syndrome, not elsewhere classified: Secondary | ICD-10-CM | POA: Insufficient documentation

## 2021-08-18 MED ORDER — MORPHINE SULFATE 15 MG PO TABS: 15.0000 mg | ORAL_TABLET | Freq: Two times a day (BID) | ORAL | 0 refills | Status: DC | PRN

## 2021-08-18 MED ORDER — MORPHINE SULFATE ER 15 MG PO TBCR: 15.0000 mg | EXTENDED_RELEASE_TABLET | Freq: Three times a day (TID) | ORAL | 0 refills | Status: DC

## 2021-08-18 NOTE — Progress Notes (Signed)
? ?Subjective:  ? ? Patient ID: [name] [name], female    DOB: [birth year], 66 y.o.   MRN: [id number] ? ?HPI: [name] [name] is a 66 y.o. female who returns for follow up appointment for chronic pain and medication refill. She states her pain is located in her lower back. She rates her pain 6.Her  current exercise regime is walking and performing stretching exercises. ? ?[name] [name] Morphine equivalent is 75.00 MME.   Last UDS was Performed on 06/22/2021, it was consistent.  ?  ?Pain Inventory ?Average Pain 6 ?Pain Right Now 6 ?My pain is constant, sharp, and aching ? ?In the last 24 hours, has pain interfered with the following? ?General activity 7 ?Relation with others 7 ?Enjoyment of life 7 ?What TIME of day is your pain at its worst? morning , daytime, and night ?Sleep (in general) Poor ? ?Pain is worse with: walking, inactivity, and standing ?Pain improves with: rest, heat/ice, and medication ?Relief from Meds: 1 ? ?Family History  ?Problem Relation Age of Onset  ? Hyperlipidemia Mother   ? Heart attack Mother   ? Heart disease Mother 50  ?     CHF; CAD  ? Hypertension Mother   ? Heart failure Mother   ? Diabetes Sister   ? Lupus Sister   ? Kidney failure Sister   ? Hyperlipidemia Sister   ? Hypertension Sister   ? Hypertension Brother   ? Stroke Maternal Aunt   ? Colon cancer Neg Hx   ? Rectal cancer Neg Hx   ? ?Social History  ? ?Socioeconomic History  ? Marital status: Widowed  ?  Spouse name: Not on file  ? Number of children: 3  ? Years of education: Not on file  ? Highest education level: Not on file  ?Occupational History  ? Occupation: disability  ?  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 Smoking status: Former  ?  Packs/day: 0.50  ?  Years: 20.00  ?  Pack years: 10.00  ?  Types: Cigarettes  ?  Quit date: 07/21/2008  ?  Years since quitting: 13.0  ? Smokeless tobacco: Never  ?Vaping Use  ? Vaping Use: Never used  ?Substance and Sexual Activity  ? Alcohol use: No  ? Drug use: No  ? Sexual  activity: Not on file  ?Other Topics Concern  ? Not on file  ?Social History Narrative  ? Marital status: widowed since 2002; not dating which is bad in 2018  ?    Children: 3 children (45, 42, 40); 13 grandchildren; 1 gg  ?    Employment: disability since 2009; retail  ?    Lives: alone in [place] in apartment  ?    Tobacco: quit in 2011  ?    Alcohol: none  ?    Drugs: none  ?    Exercise: minimal in 2018  ?    ADLs: independent with ADLs; drives  ?    Seatbelt: 100%; no texting.  ? ?Social Determinants of Health  ? ?Financial Resource Strain: Not on file  ?Food Insecurity: Not on file  ?Transportation Needs: Not on file  ?Physical Activity: Not on file  ?Stress: Not on file  ?Social Connections: Not on file  ? ?Past Surgical History:  ?Procedure Laterality Date  ? ABDOMINAL HYSTERECTOMY  1982  ? DUB; ovaries intact  ? bladder surg  1992  ? bladder retraction  ? BREAST BIOPSY    ? BREAST REDUCTION SURGERY  1984  ? CARDIAC CATHETERIZATION  1990's  ? [place]  ? COLON SURGERY  05/11/11  ? Duke Lysis of adhesions Crohn's  ? MASS EXCISION Left 01/03/2018  ? Procedure: EXCISION LEFT UPPER BACK LIPOMA ERAS PATH;  Surgeon: Erroll Luna, MD;  Location: [facility];  Service: General;  Laterality: Left;  ? REDUCTION MAMMAPLASTY    ? SPINE SURGERY    ? 5 total (1 upper, 4 lumbar)  ? SPINE SURGERY    ? nerve stimulator on right side  ? ?Past Surgical History:  ?Procedure Laterality Date  ? ABDOMINAL HYSTERECTOMY  1982  ? DUB; ovaries intact  ? bladder surg  1992  ? bladder retraction  ? BREAST BIOPSY    ? BREAST REDUCTION SURGERY  1984  ? CARDIAC CATHETERIZATION  1990's  ? [place]  ? COLON SURGERY  05/11/11  ? Duke Lysis of adhesions Crohn's  ? MASS EXCISION Left 01/03/2018  ? Procedure: EXCISION LEFT UPPER BACK LIPOMA ERAS PATH;  Surgeon: Erroll Luna, MD;  Location: [facility];  Service: General;  Laterality: Left;  ? REDUCTION MAMMAPLASTY    ? SPINE SURGERY    ? 5 total (1 upper,  4 lumbar)  ? SPINE SURGERY    ? nerve stimulator on right side  ? ?Past Medical History:  ?Diagnosis Date  ? Anemia   ? Blood transfusion without reported diagnosis   ? Crohn's disease ([place])   ? Disorder of sacroiliac joint   ? Exertional dyspnea 05/28/2021  ? Hyperlipidemia   ? Hypertension   ? Ischemic colitis ([place])   ? Lumbago   ? Lumbar post-laminectomy syndrome   ? Lumbosacral neuritis   ? Lumbosacral radiculitis   ? OSA (obstructive sleep apnea) 05/28/2021  ? Sciatica   ? ?BP (!) 157/89   Pulse (!) 58   Ht 5\' 4"  (1.626 m)   Wt 218 lb (98.9 kg)   SpO2 97%   BMI 37.42 kg/m?  ? ?Opioid Risk Score:   ?Fall Risk Score:  `1 ? ?Depression screen PHQ 2/9 ? ?Depression screen [facility] 2/9 08/18/2021 07/21/2021 06/22/2021 06/01/2021 04/21/2021 03/23/2021 03/17/2021  ?Decreased Interest 1 1 0 0 0 0 1  ?Down, Depressed, Hopeless 0 0 0 0 0 0 0  ?PHQ - 2 Score 1 1 0 0 0 0 1  ?Altered sleeping - - - - - - 0  ?Tired, decreased energy - - - - - - 2  ?Change in appetite - - - - - - 3  ?Feeling bad or failure about yourself  - - - - - - 0  ?Trouble concentrating - - - - - - 0  ?Moving slowly or fidgety/restless - - - - - - 0  ?Suicidal thoughts - - - - - - 0  ?PHQ-9 Score - - - - - - 6  ?Difficult doing work/chores - - - - - - Not difficult at all  ?Some recent data might be hidden  ?  ? ? ?Review of Systems  ?Musculoskeletal:  Positive for back pain.  ?     Right shoulder pain  ?All other systems reviewed and are negative. ? ?   ?Objective:  ? Physical Exam ?Vitals and nursing note reviewed.  ?Constitutional:   ?   Appearance: Normal appearance.  ?Cardiovascular:  ?   Rate and Rhythm: Normal rate and regular rhythm.  ?   Pulses: Normal pulses.  ?   Heart sounds: Normal heart sounds.  ?Pulmonary:  ?  Effort: Pulmonary effort is normal.  ?   Breath sounds: Normal breath sounds.  ?Musculoskeletal:  ?   Cervical back: Normal range of motion and neck supple.  ?   Comments: Normal Muscle Bulk and Muscle Testing Reveals:  ?Upper Extremities:  Full ROM and Muscle Strength 5/5 ?Lumbar Paraspinal Tenderness: L-4-L-5 ?Lower Extremities: Full ROM and Muscle Strength 5/5 ? Arises from Table with  Ease ?Narrow Based Gait  ?   ?Skin: ?   General: Skin is warm and dry.  ?Neurological:  ?   Mental Status: She is alert and oriented to person, place, and time.  ?Psychiatric:     ?   Mood and Affect: Mood normal.     ?   Behavior: Behavior normal.  ? ? ? ? ?   ?Assessment & Plan:  ?1.Lumbar Postlaminectomy/ Spinal Stenosis Lumbar Region/ Post-Op Pain/Low back pain with radiating symptoms in L3-4 distribution:  continues to be limited by pain. Refilled:  MSIR 15 mg one tablet twice a day as needed for moderate to sever pain #60 and  Morphine Sulfate ER 15 mg Q 8 hours. 08/18/2021. ?We will continue the opioid monitoring program, this consists of regular clinic visits, examinations, urine drug screen, pill counts as well as use of [place] Controlled Substance Reporting system. A 12 month History has been reviewed on the [place] Controlled Substance Reporting System on 02/19/2021. Encouraged to Continue exercise regime. ?  2. Lumbar Radiculopathy: No complaints today. Continue Topamax:.Continue to Monitor 08/18/2021 ?3.Chronic  Right Knee Pain: No complaints today.  Continue with Ice Therapy and  Voltaren Gel. 08/18/2021 ?4. Muscle Spasm: Continue current treatment: Tizanidine. 08/18/2021 ? 5. Lipoma: S/P Left  Lipoma Excision of upper back  By Dr. Brantley Stage on 01/03/2018. Surgery Following. Continue to Monitor. 08/18/2021.  ? 6. Paresthesia of Skin: Continue Topamax. Continue to monitor. 08/18/2021 ?7. Adhesive Capsulitis of Right Shoulder:Right Shoulder Tendonitis Pain:  S/P Right Shoulder Cortisone Injection with Dr Letta Pate, with good relief noted. Continue to monitor. Continue HEP as Tolerated. 08/18/2021 ? 8. Polyarthralgia: Continue HEP as Tolerated. Continue to Monitor.08/18/2021 ? 9. Bradycardia: Apical Pulse check:  Ms. Murthy states Cardiology is  following. Continue to Monitor.  ?  ?F/U in 1 month  ?   ?  ? ?

## 2021-09-13 ENCOUNTER — Telehealth: Payer: Self-pay | Admitting: *Deleted

## 2021-09-13 MED ORDER — MORPHINE SULFATE 15 MG PO TABS: 15.0000 mg | ORAL_TABLET | Freq: Two times a day (BID) | ORAL | 0 refills | Status: DC | PRN

## 2021-09-13 MED ORDER — MORPHINE SULFATE ER 15 MG PO TBCR: 15.0000 mg | EXTENDED_RELEASE_TABLET | Freq: Three times a day (TID) | ORAL | 0 refills | Status: DC

## 2021-09-13 NOTE — Telephone Encounter (Signed)
Kimberli called and reports her pharmacy does not have the Morphine Sulfate IR in stock and they need the Rx sent to them to have written documentation in order to order it.  ?

## 2021-09-13 NOTE — Telephone Encounter (Signed)
PMP was Reviewed.  ?MS Contin and MSIR e-scribed today. Ms. Kassab has a scheduled appointment on 09/15/2021. Placed a call to Ms. Donnellan regarding the above, she verbalizes understanding.  ?

## 2021-09-15 ENCOUNTER — Encounter (HOSPITAL_BASED_OUTPATIENT_CLINIC_OR_DEPARTMENT_OTHER): Payer: Medicare Other | Admitting: Registered Nurse

## 2021-09-15 ENCOUNTER — Other Ambulatory Visit: Payer: Self-pay

## 2021-09-15 ENCOUNTER — Encounter: Payer: Self-pay | Admitting: Registered Nurse

## 2021-09-15 VITALS — BP 137/71 | HR 50 | Ht 64.0 in | Wt 218.0 lb

## 2021-09-15 DIAGNOSIS — Z5181 Encounter for therapeutic drug level monitoring: Secondary | ICD-10-CM | POA: Diagnosis not present

## 2021-09-15 DIAGNOSIS — M4807 Spinal stenosis, lumbosacral region: Secondary | ICD-10-CM | POA: Diagnosis not present

## 2021-09-15 DIAGNOSIS — M62838 Other muscle spasm: Secondary | ICD-10-CM | POA: Diagnosis not present

## 2021-09-15 DIAGNOSIS — G894 Chronic pain syndrome: Secondary | ICD-10-CM | POA: Diagnosis not present

## 2021-09-15 DIAGNOSIS — R001 Bradycardia, unspecified: Secondary | ICD-10-CM

## 2021-09-15 DIAGNOSIS — Z79891 Long term (current) use of opiate analgesic: Secondary | ICD-10-CM

## 2021-09-15 DIAGNOSIS — M961 Postlaminectomy syndrome, not elsewhere classified: Secondary | ICD-10-CM

## 2021-09-15 MED ORDER — MORPHINE SULFATE ER 15 MG PO TBCR: 15.0000 mg | EXTENDED_RELEASE_TABLET | Freq: Three times a day (TID) | ORAL | 0 refills | Status: DC

## 2021-09-15 MED ORDER — MORPHINE SULFATE 15 MG PO TABS: 15.0000 mg | ORAL_TABLET | Freq: Two times a day (BID) | ORAL | 0 refills | Status: DC | PRN

## 2021-09-15 NOTE — Progress Notes (Signed)
? ?Subjective:  ? ? Patient ID: [name] [name], female    DOB: 01/18/1956, 66 y.o.   MRN: [id number] ? ?HPI: [name] [name] is a 66 y.o. female who returns for follow up appointment for chronic pain and medication refill. She states her pain is located in her lower back. She rates her pain 6. Her current exercise regime is walking and performing stretching exercises. ? ?[name] [name] arrived to office Bradycardic, apical pulse checked. Cardiologist following.  ? ?[name] [name] Morphine equivalent is 75.00 MME.   Last UDS was Performed on 06/22/2021, it was consistent.  ?  ? ? ?Pain Inventory ?Average Pain 6 ?Pain Right Now 6 ?My pain is constant, sharp, and aching ? ?In the last 24 hours, has pain interfered with the following? ?General activity 6 ?Relation with others 6 ?Enjoyment of life 6 ?What TIME of day is your pain at its worst? morning , daytime, evening, night, and varies ?Sleep (in general) Poor ? ?Pain is worse with: walking, inactivity, and standing ?Pain improves with: rest, heat/ice, pacing activities, and medication ?Relief from Meds: 2 ? ?Family History  ?Problem Relation Age of Onset  ? Hyperlipidemia Mother   ? Heart attack Mother   ? Heart disease Mother 51  ?     CHF; CAD  ? Hypertension Mother   ? Heart failure Mother   ? Diabetes Sister   ? Lupus Sister   ? Kidney failure Sister   ? Hyperlipidemia Sister   ? Hypertension Sister   ? Hypertension Brother   ? Stroke Maternal Aunt   ? Colon cancer Neg Hx   ? Rectal cancer Neg Hx   ? ?Social History  ? ?Socioeconomic History  ? Marital status: Widowed  ?  Spouse name: Not on file  ? Number of children: 3  ? Years of education: Not on file  ? Highest education level: Not on file  ?Occupational History  ? Occupation: disability  ?  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 Smoking status: Former  ?  Packs/day: 0.50  ?  Years: 20.00  ?  Pack years: 10.00  ?  Types: Cigarettes  ?  Quit date: 07/21/2008  ?  Years since quitting: 13.1  ? Smokeless  tobacco: Never  ?Vaping Use  ? Vaping Use: Never used  ?Substance and Sexual Activity  ? Alcohol use: No  ? Drug use: No  ? Sexual activity: Not on file  ?Other Topics Concern  ? Not on file  ?Social History Narrative  ? Marital status: widowed since 2002; not dating which is bad in 2018  ?    Children: 3 children (45, 42, 40); 13 grandchildren; 1 gg  ?    Employment: disability since 2009; retail  ?    Lives: alone in [place] in apartment  ?    Tobacco: quit in 2011  ?    Alcohol: none  ?    Drugs: none  ?    Exercise: minimal in 2018  ?    ADLs: independent with ADLs; drives  ?    Seatbelt: 100%; no texting.  ? ?Social Determinants of Health  ? ?Financial Resource Strain: Not on file  ?Food Insecurity: Not on file  ?Transportation Needs: Not on file  ?Physical Activity: Not on file  ?Stress: Not on file  ?Social Connections: Not on file  ? ?Past Surgical History:  ?Procedure Laterality Date  ? ABDOMINAL HYSTERECTOMY  1982  ? DUB; ovaries intact  ?  bladder surg  1992  ? bladder retraction  ? BREAST BIOPSY    ? BREAST REDUCTION SURGERY  1984  ? CARDIAC CATHETERIZATION  1990's  ? [place]  ? COLON SURGERY  05/11/11  ? Duke Lysis of adhesions Crohn's  ? MASS EXCISION Left 01/03/2018  ? Procedure: EXCISION LEFT UPPER BACK LIPOMA ERAS PATH;  Surgeon: Erroll Luna, MD;  Location: [facility];  Service: General;  Laterality: Left;  ? REDUCTION MAMMAPLASTY    ? SPINE SURGERY    ? 5 total (1 upper, 4 lumbar)  ? SPINE SURGERY    ? nerve stimulator on right side  ? ?Past Surgical History:  ?Procedure Laterality Date  ? ABDOMINAL HYSTERECTOMY  1982  ? DUB; ovaries intact  ? bladder surg  1992  ? bladder retraction  ? BREAST BIOPSY    ? BREAST REDUCTION SURGERY  1984  ? CARDIAC CATHETERIZATION  1990's  ? [place]  ? COLON SURGERY  05/11/11  ? Duke Lysis of adhesions Crohn's  ? MASS EXCISION Left 01/03/2018  ? Procedure: EXCISION LEFT UPPER BACK LIPOMA ERAS PATH;  Surgeon: Erroll Luna, MD;  Location:  [facility];  Service: General;  Laterality: Left;  ? REDUCTION MAMMAPLASTY    ? SPINE SURGERY    ? 5 total (1 upper, 4 lumbar)  ? SPINE SURGERY    ? nerve stimulator on right side  ? ?Past Medical History:  ?Diagnosis Date  ? Anemia   ? Blood transfusion without reported diagnosis   ? Crohn's disease ([place])   ? Disorder of sacroiliac joint   ? Exertional dyspnea 05/28/2021  ? Hyperlipidemia   ? Hypertension   ? Ischemic colitis ([place])   ? Lumbago   ? Lumbar post-laminectomy syndrome   ? Lumbosacral neuritis   ? Lumbosacral radiculitis   ? OSA (obstructive sleep apnea) 05/28/2021  ? Sciatica   ? ?BP (!) 150/84   Pulse (!) 53   Ht '5\' 4"'$  (1.626 m)   Wt 218 lb (98.9 kg)   SpO2 96%   BMI 37.42 kg/m?  ? ?Opioid Risk Score:   ?Fall Risk Score:  `1 ? ?Depression screen PHQ 2/9 ? ? ?  09/15/2021  ?  8:56 AM 08/18/2021  ?  9:52 AM 07/21/2021  ?  8:35 AM 06/22/2021  ?  9:47 AM 06/01/2021  ?  9:00 AM 04/21/2021  ?  9:00 AM 03/23/2021  ? 10:10 AM  ?Depression screen PHQ 2/9  ?Decreased Interest 0 1 1 0 0 0 0  ?Down, Depressed, Hopeless 0 0 0 0 0 0 0  ?PHQ - 2 Score 0 1 1 0 0 0 0  ? ? ?Review of Systems  ?Musculoskeletal:  Positive for back pain.  ?     Pain in the right shoulder  ?All other systems reviewed and are negative. ? ?   ?Objective:  ? Physical Exam ?Vitals and nursing note reviewed.  ?Constitutional:   ?   Appearance: Normal appearance.  ?Cardiovascular:  ?   Rate and Rhythm: Normal rate and regular rhythm.  ?   Pulses: Normal pulses.  ?   Heart sounds: Normal heart sounds.  ?Pulmonary:  ?   Effort: Pulmonary effort is normal.  ?   Breath sounds: Normal breath sounds.  ?Musculoskeletal:  ?   Cervical back: Normal range of motion and neck supple.  ?   Comments: Normal Muscle Bulk and Muscle Testing Reveals:  ?Upper Extremities: Full ROM and Muscle Strength  5/5 ?Lumbar Paraspinal Tenderness: L-4-L-5 ?Lower Extremities: Full ROM and Muscle Strength 5/5 ?Arises from Table with ease ?Narrow Based  Gait  ?   ?Skin: ?    General: Skin is warm and dry.  ?Neurological:  ?   Mental Status: She is alert and oriented to person, place, and time.  ?Psychiatric:     ?   Mood and Affect: Mood normal.     ?   Behavior: Behavior normal.  ? ? ? ? ?   ?Assessment & Plan:  ?1.Lumbar Postlaminectomy/ Spinal Stenosis Lumbar Region/ Post-Op Pain/Low back pain with radiating symptoms in L3-4 distribution:  continues to be limited by pain. Refilled:  MSIR 15 mg one tablet twice a day as needed for moderate to sever pain #60 and  Morphine Sulfate ER 15 mg Q 8 hours. 09/15/2021. ?We will continue the opioid monitoring program, this consists of regular clinic visits, examinations, urine drug screen, pill counts as well as use of [place] Controlled Substance Reporting system. A 12 month History has been reviewed on the [place] Controlled Substance Reporting System on 02/19/2021. Encouraged to Continue exercise regime. ?  2. Lumbar Radiculopathy: No complaints today. Continue Topamax:.Continue to Monitor 09/15/2021 ?3.Chronic  Right Knee Pain: No complaints today.  Continue with Ice Therapy and  Voltaren Gel. 09/15/2021 ?4. Muscle Spasm: Continue current treatment: Tizanidine. 09/15/2021 ? 5. Lipoma: S/P Left  Lipoma Excision of upper back  By Dr. Brantley Stage on 01/03/2018. Surgery Following. Continue to Monitor. 09/15/2021.  ? 6. Paresthesia of Skin: Continue Topamax. Continue to monitor. 09/15/2021 ?7. Adhesive Capsulitis of Right Shoulder:Right Shoulder Tendonitis Pain:  S/P Right Shoulder Cortisone Injection with Dr Letta Pate, with good relief noted. Continue to monitor. Continue HEP as Tolerated. 09/15/2021 ? 8. Polyarthralgia: Continue HEP as Tolerated. Continue to Monitor.09/15/2021 ? 9. Bradycardia: Apical Pulse check:  [name] [name] states Cardiology is following. Continue to Monitor. 09/15/2021 ?  ?F/U in 1 month  ?   ? ` ?

## 2021-09-16 ENCOUNTER — Telehealth: Payer: Self-pay

## 2021-09-16 NOTE — Telephone Encounter (Signed)
Patient called after 4 PM today stating [facility] is out of stock of Morphine 15 MG ER.  I called [facility] they have #80 in [place]. Today.  ? ?[name] [name] was informed about the supply.  Patient will call back, tomorrow. To let us know if she wants the new script sent there.  ?

## 2021-09-17 NOTE — Telephone Encounter (Signed)
[name] [name] left a message on the voicemail: [facility]. Rx in progress to be filled.  ?

## 2021-09-27 ENCOUNTER — Telehealth: Payer: Self-pay

## 2021-09-27 MED ORDER — MORPHINE SULFATE ER 15 MG PO TBCR: 15.0000 mg | EXTENDED_RELEASE_TABLET | Freq: Three times a day (TID) | ORAL | 0 refills | Status: DC

## 2021-09-27 NOTE — Telephone Encounter (Signed)
Patient called and stated she only got 40 tablets on her March prescription. She needs a new prescription sent in for the remaining 50. She states she only has 1 tablet left, but the pharmacy stated she picked it up on 09/17/21. She should have at least 7 or more tablets left if she is taking it 3 times daily.The pharmacist stated a prescription for 50 tablets can be sent in since an April prescription is already in the system. [facility] [place], [place] on [address] ?

## 2021-09-27 NOTE — Telephone Encounter (Signed)
PMP was Reviewed.  ?MS Contin E- scribed today.  ?Placed a call to [name] [name] regarding the above, she verbalizes understanding.  ?

## 2021-10-01 DIAGNOSIS — G4733 Obstructive sleep apnea (adult) (pediatric): Secondary | ICD-10-CM | POA: Diagnosis not present

## 2021-10-12 ENCOUNTER — Encounter: Payer: Medicare Other | Admitting: Family Medicine

## 2021-10-18 ENCOUNTER — Encounter: Payer: Self-pay | Admitting: Registered Nurse

## 2021-10-18 ENCOUNTER — Encounter: Payer: Medicare Other | Attending: Physical Medicine & Rehabilitation | Admitting: Registered Nurse

## 2021-10-18 VITALS — BP 130/73 | HR 56 | Ht 64.0 in | Wt 220.0 lb

## 2021-10-18 DIAGNOSIS — R001 Bradycardia, unspecified: Secondary | ICD-10-CM | POA: Insufficient documentation

## 2021-10-18 DIAGNOSIS — M961 Postlaminectomy syndrome, not elsewhere classified: Secondary | ICD-10-CM | POA: Diagnosis not present

## 2021-10-18 DIAGNOSIS — G894 Chronic pain syndrome: Secondary | ICD-10-CM | POA: Diagnosis not present

## 2021-10-18 DIAGNOSIS — M4807 Spinal stenosis, lumbosacral region: Secondary | ICD-10-CM | POA: Insufficient documentation

## 2021-10-18 DIAGNOSIS — Z79891 Long term (current) use of opiate analgesic: Secondary | ICD-10-CM | POA: Insufficient documentation

## 2021-10-18 DIAGNOSIS — Z5181 Encounter for therapeutic drug level monitoring: Secondary | ICD-10-CM | POA: Diagnosis not present

## 2021-10-18 DIAGNOSIS — M62838 Other muscle spasm: Secondary | ICD-10-CM | POA: Insufficient documentation

## 2021-10-18 MED ORDER — MORPHINE SULFATE ER 15 MG PO TBCR: 15.0000 mg | EXTENDED_RELEASE_TABLET | Freq: Three times a day (TID) | ORAL | 0 refills | Status: DC

## 2021-10-18 MED ORDER — MORPHINE SULFATE 15 MG PO TABS: 15.0000 mg | ORAL_TABLET | Freq: Two times a day (BID) | ORAL | 0 refills | Status: DC | PRN

## 2021-10-18 NOTE — Progress Notes (Signed)
? ?Subjective:  ? ? Patient ID: [name] [name], female    DOB: Jul 07, 1955, 66 y.o.   MRN: [id number] ? ?HPI: [name] [name] is a 66 y.o. female who returns for follow up appointment for chronic pain and medication refill. She states her pain is located in her lower back. She rates her pain 2. Her current exercise regime is walking and performing stretching exercises. ? ?Ms. Shimel Morphine equivalent is 75.00 MME.   Last UDS was Performed on 06/22/2021, it was consistent.  ?  ? ? ?Pain Inventory ?Average Pain 2 ?Pain Right Now 2 ?My pain is constant, stabbing, and aching ? ?In the last 24 hours, has pain interfered with the following? ?General activity 7 ?Relation with others 7 ?Enjoyment of life 7 ?What TIME of day is your pain at its worst? morning , daytime, and night ?Sleep (in general) Poor ? ?Pain is worse with: walking, inactivity, and standing ?Pain improves with: rest, heat/ice, pacing activities, medication, and spinal stimulator ?Relief from Meds: 0 ? ?Family History  ?Problem Relation Age of Onset  ? Hyperlipidemia Mother   ? Heart attack Mother   ? Heart disease Mother 19  ?     CHF; CAD  ? Hypertension Mother   ? Heart failure Mother   ? Diabetes Sister   ? Lupus Sister   ? Kidney failure Sister   ? Hyperlipidemia Sister   ? Hypertension Sister   ? Hypertension Brother   ? Stroke Maternal Aunt   ? Colon cancer Neg Hx   ? Rectal cancer Neg Hx   ? ?Social History  ? ?Socioeconomic History  ? Marital status: Widowed  ?  Spouse name: Not on file  ? Number of children: 3  ? Years of education: Not on file  ? Highest education level: Not on file  ?Occupational History  ? Occupation: disability  ?  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 Smoking status: Former  ?  Packs/day: 0.50  ?  Years: 20.00  ?  Pack years: 10.00  ?  Types: Cigarettes  ?  Quit date: 07/21/2008  ?  Years since quitting: 13.2  ? Smokeless tobacco: Never  ?Vaping Use  ? Vaping Use: Never used  ?Substance and Sexual Activity  ?  Alcohol use: No  ? Drug use: No  ? Sexual activity: Not on file  ?Other Topics Concern  ? Not on file  ?Social History Narrative  ? Marital status: widowed since 2002; not dating which is bad in 2018  ?    Children: 3 children (45, 42, 40); 13 grandchildren; 1 gg  ?    Employment: disability since 2009; retail  ?    Lives: alone in [place] in apartment  ?    Tobacco: quit in 2011  ?    Alcohol: none  ?    Drugs: none  ?    Exercise: minimal in 2018  ?    ADLs: independent with ADLs; drives  ?    Seatbelt: 100%; no texting.  ? ?Social Determinants of Health  ? ?Financial Resource Strain: Not on file  ?Food Insecurity: Not on file  ?Transportation Needs: Not on file  ?Physical Activity: Not on file  ?Stress: Not on file  ?Social Connections: Not on file  ? ?Past Surgical History:  ?Procedure Laterality Date  ? ABDOMINAL HYSTERECTOMY  1982  ? DUB; ovaries intact  ? bladder surg  1992  ? bladder retraction  ? BREAST BIOPSY    ?  BREAST REDUCTION SURGERY  1984  ? CARDIAC CATHETERIZATION  1990's  ? [place]  ? COLON SURGERY  05/11/11  ? Duke Lysis of adhesions Crohn's  ? MASS EXCISION Left 01/03/2018  ? Procedure: EXCISION LEFT UPPER BACK LIPOMA ERAS PATH;  Surgeon: Erroll Luna, MD;  Location: [facility];  Service: General;  Laterality: Left;  ? REDUCTION MAMMAPLASTY    ? SPINE SURGERY    ? 5 total (1 upper, 4 lumbar)  ? SPINE SURGERY    ? nerve stimulator on right side  ? ?Past Surgical History:  ?Procedure Laterality Date  ? ABDOMINAL HYSTERECTOMY  1982  ? DUB; ovaries intact  ? bladder surg  1992  ? bladder retraction  ? BREAST BIOPSY    ? BREAST REDUCTION SURGERY  1984  ? CARDIAC CATHETERIZATION  1990's  ? [place]  ? COLON SURGERY  05/11/11  ? Duke Lysis of adhesions Crohn's  ? MASS EXCISION Left 01/03/2018  ? Procedure: EXCISION LEFT UPPER BACK LIPOMA ERAS PATH;  Surgeon: Erroll Luna, MD;  Location: [facility];  Service: General;  Laterality: Left;  ? REDUCTION MAMMAPLASTY     ? SPINE SURGERY    ? 5 total (1 upper, 4 lumbar)  ? SPINE SURGERY    ? nerve stimulator on right side  ? ?Past Medical History:  ?Diagnosis Date  ? Anemia   ? Blood transfusion without reported diagnosis   ? Crohn's disease ([place])   ? Disorder of sacroiliac joint   ? Exertional dyspnea 05/28/2021  ? Hyperlipidemia   ? Hypertension   ? Ischemic colitis ([place])   ? Lumbago   ? Lumbar post-laminectomy syndrome   ? Lumbosacral neuritis   ? Lumbosacral radiculitis   ? OSA (obstructive sleep apnea) 05/28/2021  ? Sciatica   ? ?BP 130/73   Pulse (!) 53   Ht '5\' 4"'$  (1.626 m)   Wt 220 lb (99.8 kg)   SpO2 96%   BMI 37.76 kg/m?  ? ?Opioid Risk Score:   ?Fall Risk Score:  `1 ? ?Depression screen PHQ 2/9 ? ? ?  09/15/2021  ?  8:56 AM 08/18/2021  ?  9:52 AM 07/21/2021  ?  8:35 AM 06/22/2021  ?  9:47 AM 06/01/2021  ?  9:00 AM 04/21/2021  ?  9:00 AM 03/23/2021  ? 10:10 AM  ?Depression screen PHQ 2/9  ?Decreased Interest 0 1 1 0 0 0 0  ?Down, Depressed, Hopeless 0 0 0 0 0 0 0  ?PHQ - 2 Score 0 1 1 0 0 0 0  ?  ?Review of Systems  ?Musculoskeletal:  Positive for back pain. Negative for gait problem.  ?     Right shoulder pain  ?All other systems reviewed and are negative. ? ?   ?Objective:  ? Physical Exam ?Vitals and nursing note reviewed.  ?Constitutional:   ?   Appearance: Normal appearance.  ?Cardiovascular:  ?   Rate and Rhythm: Normal rate and regular rhythm.  ?   Pulses: Normal pulses.  ?   Heart sounds: Normal heart sounds.  ?Pulmonary:  ?   Effort: Pulmonary effort is normal.  ?   Breath sounds: Normal breath sounds.  ?Musculoskeletal:  ?   Cervical back: Normal range of motion and neck supple.  ?   Comments: Normal Muscle Bulk and Muscle Testing Reveals:  ?Upper Extremities: Full ROM and Muscle Strength 5/5 ?Lumbar Paraspinal Tenderness: L-3-L-5 ?Lower Extremities: Full ROM ad Muscle Strength 5/5 ?Arises from  Table Slowly ?Narrow Based  Gait  ?   ?Skin: ?   General: Skin is warm and dry.  ?Neurological:  ?   Mental Status: She is  alert and oriented to person, place, and time.  ?Psychiatric:     ?   Mood and Affect: Mood normal.     ?   Behavior: Behavior normal.  ? ? ? ? ?   ?Assessment & Plan:  ?1.Lumbar Postlaminectomy/ Spinal Stenosis Lumbar Region/ Post-Op Pain/Low back pain with radiating symptoms in L3-4 distribution:  continues to be limited by pain. Refilled:  MSIR 15 mg one tablet twice a day as needed for moderate to sever pain #60 and  Morphine Sulfate ER 15 mg Q 8 hours. 10/18/2021. ?We will continue the opioid monitoring program, this consists of regular clinic visits, examinations, urine drug screen, pill counts as well as use of [place] Controlled Substance Reporting system. A 12 month History has been reviewed on the [place] Controlled Substance Reporting System on 10/18/2020. Encouraged to Continue exercise regime. ?  2. Lumbar Radiculopathy: No complaints today. Continue Topamax:.Continue to Monitor 10/18/2021 ?3.Chronic  Right Knee Pain: No complaints today.  Continue with Ice Therapy and  Voltaren Gel. 10/18/2021 ?4. Muscle Spasm: Continue current treatment: Tizanidine. 10/18/2021 ? 5. Lipoma: S/P Left  Lipoma Excision of upper back  By Dr. Brantley Stage on 01/03/2018. Surgery Following. Continue to Monitor. 10/18/2021.  ? 6. Paresthesia of Skin: Continue Topamax. Continue to monitor. 10/18/2021 ?7. Adhesive Capsulitis of Right Shoulder:Right Shoulder Tendonitis Pain:  S/P Right Shoulder Cortisone Injection with Dr Letta Pate, with good relief noted. Continue to monitor. Continue HEP as Tolerated. 10/18/2021 ? 8. Polyarthralgia: Continue HEP as Tolerated. Continue to Monitor.10/18/2021 ? 9. Bradycardia: Apical Pulse check:  Ms. Dumm states Cardiology is following. Continue to Monitor. 10/18/2021 ?  ?F/U in 1 month  ?   ? ` ? ?

## 2021-10-26 ENCOUNTER — Ambulatory Visit (HOSPITAL_BASED_OUTPATIENT_CLINIC_OR_DEPARTMENT_OTHER): Payer: Medicare Other | Admitting: Cardiovascular Disease

## 2021-10-31 DIAGNOSIS — G4733 Obstructive sleep apnea (adult) (pediatric): Secondary | ICD-10-CM | POA: Diagnosis not present

## 2021-11-09 ENCOUNTER — Encounter: Payer: Self-pay | Admitting: Family Medicine

## 2021-11-09 ENCOUNTER — Ambulatory Visit (INDEPENDENT_AMBULATORY_CARE_PROVIDER_SITE_OTHER): Payer: Medicare Other | Admitting: Family Medicine

## 2021-11-09 VITALS — BP 151/64 | HR 56 | Ht 64.0 in | Wt 219.5 lb

## 2021-11-09 DIAGNOSIS — R918 Other nonspecific abnormal finding of lung field: Secondary | ICD-10-CM | POA: Diagnosis not present

## 2021-11-09 DIAGNOSIS — E782 Mixed hyperlipidemia: Secondary | ICD-10-CM

## 2021-11-09 DIAGNOSIS — Z8639 Personal history of other endocrine, nutritional and metabolic disease: Secondary | ICD-10-CM | POA: Diagnosis not present

## 2021-11-09 DIAGNOSIS — R7303 Prediabetes: Secondary | ICD-10-CM

## 2021-11-09 DIAGNOSIS — Z0000 Encounter for general adult medical examination without abnormal findings: Secondary | ICD-10-CM | POA: Diagnosis not present

## 2021-11-09 DIAGNOSIS — M65331 Trigger finger, right middle finger: Secondary | ICD-10-CM

## 2021-11-09 DIAGNOSIS — M5412 Radiculopathy, cervical region: Secondary | ICD-10-CM

## 2021-11-09 DIAGNOSIS — Z13820 Encounter for screening for osteoporosis: Secondary | ICD-10-CM | POA: Diagnosis not present

## 2021-11-09 NOTE — Patient Instructions (Addendum)
It was great seeing you today!  Please check-out at the front desk before leaving the clinic. I'd like to see you back in 3 months but if you need to be seen earlier than that for any new issues we're happy to fit you in, just give [place] a call!  Visit Remembers: - Someone will call you to schedule your osteoporosis screening  - Be sure to get your mammogram this year.  - Continue to work on your healthy eating habits and incorporating exercise into your daily life.  - A referral to orthopedic surgery was placed be call us in 2 weeks if you have not been contacted about an appointment.  Regarding lab work today:  Due to recent changes in healthcare laws, you may see the results of your imaging and laboratory studies on MyChart before your provider has had a chance to review them.  I understand that in some cases there may be results that are confusing or concerning to you. Not all laboratory results come back in the same time frame and you may be waiting for multiple results in order to interpret others.  Please give [place] 72 hours in order for your provider to thoroughly review all the results before contacting the office for clarification of your results. If everything is normal, you will get a letter in the mail or a message in My Chart. Please give [place] a call if you do not hear from [place] after 2 weeks.  Feel free to call with any questions or concerns at any time, at [phone].   Take care,  Dr. Rushie [facility]

## 2021-11-09 NOTE — Assessment & Plan Note (Addendum)
Exam concerning for cervical radiculopathy.  Has history of postlaminectomy.  Offered gabapentin however patient states that this made her gain weight and she does not like taking weight.  Discussed evaluation with orthopedic surgery and she is agreeable.  Has chronic opioids but these are not helping.  Follows with PM&R Referral to orthopedic surgery placed.
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
## 2021-11-09 NOTE — Assessment & Plan Note (Signed)
Discussed treatment options.  Patient interested in steroid injection.  Referral to orthopedic surgery placed

## 2021-11-10 LAB — BASIC METABOLIC PANEL
BUN/Creatinine Ratio: 12 (ref 12–28)
BUN: 11 mg/dL (ref 8–27)
CO2: 27 mmol/L (ref 20–29)
Calcium: 9.7 mg/dL (ref 8.7–10.3)
Chloride: 99 mmol/L (ref 96–106)
Creatinine, Ser: 0.94 mg/dL (ref 0.57–1.00)
Glucose: 117 mg/dL — ABNORMAL HIGH (ref 70–99)
Potassium: 3.4 mmol/L — ABNORMAL LOW (ref 3.5–5.2)
Sodium: 142 mmol/L (ref 134–144)
eGFR: 67 mL/min/{1.73_m2} (ref >59)

## 2021-11-10 LAB — HEMOGLOBIN A1C
Est. average glucose Bld gHb Est-mCnc: 134 mg/dL
Hgb A1c MFr Bld: 6.3 % — ABNORMAL HIGH (ref 4.8–5.6)

## 2021-11-11 LAB — LIPID PANEL
Chol/HDL Ratio: 5 ratio — ABNORMAL HIGH (ref 0.0–4.4)
Cholesterol, Total: 225 mg/dL — ABNORMAL HIGH (ref 100–199)
HDL: 45 mg/dL (ref >39)
LDL Chol Calc (NIH): 150 mg/dL — ABNORMAL HIGH (ref 0–99)
Triglycerides: 168 mg/dL — ABNORMAL HIGH (ref 0–149)
VLDL Cholesterol Cal: 30 mg/dL (ref 5–40)

## 2021-11-11 LAB — SPECIMEN STATUS REPORT

## 2021-11-11 MED ORDER — ATORVASTATIN CALCIUM 40 MG PO TABS: 40.0000 mg | ORAL_TABLET | Freq: Every day | ORAL | 3 refills | Status: DC

## 2021-11-11 NOTE — Addendum Note (Signed)
Addended by: Lyndee Hensen D on: 11/11/2021 01:41 PM   Modules accepted: Orders

## 2021-11-12 ENCOUNTER — Telehealth: Payer: Self-pay | Admitting: Registered Nurse

## 2021-11-12 NOTE — Telephone Encounter (Signed)
Placed a call to Ms. Anglin,  She states you have appointment with Dr Lorin Mercy, She wants to discuss with Dr Letta Pate about EMG, in crease nerve pain in her left hand.

## 2021-11-16 ENCOUNTER — Ambulatory Visit: Payer: Medicare Other | Admitting: Registered Nurse

## 2021-11-18 ENCOUNTER — Other Ambulatory Visit: Payer: Self-pay

## 2021-11-18 DIAGNOSIS — J301 Allergic rhinitis due to pollen: Secondary | ICD-10-CM

## 2021-11-18 MED ORDER — FLUTICASONE PROPIONATE 50 MCG/ACT NA SUSP: NASAL | 1 refills | Status: DC

## 2021-11-18 MED ORDER — DICLOFENAC SODIUM 1 % EX GEL: CUTANEOUS | 2 refills | Status: DC

## 2021-11-23 ENCOUNTER — Encounter: Payer: Self-pay | Admitting: *Deleted

## 2021-11-25 ENCOUNTER — Other Ambulatory Visit: Payer: Self-pay | Admitting: Family Medicine

## 2021-11-25 ENCOUNTER — Ambulatory Visit
Admission: RE | Admit: 2021-11-25 | Discharge: 2021-11-25 | Disposition: A | Discharge status: Home / Self Care | Payer: Medicare Other | Source: Ambulatory Visit | Attending: Family Medicine | Admitting: Family Medicine

## 2021-11-25 DIAGNOSIS — M85832 Other specified disorders of bone density and structure, left forearm: Secondary | ICD-10-CM | POA: Diagnosis not present

## 2021-11-25 DIAGNOSIS — Z1231 Encounter for screening mammogram for malignant neoplasm of breast: Secondary | ICD-10-CM | POA: Diagnosis not present

## 2021-11-25 DIAGNOSIS — Z780 Asymptomatic menopausal state: Secondary | ICD-10-CM | POA: Diagnosis not present

## 2021-12-01 DIAGNOSIS — G4733 Obstructive sleep apnea (adult) (pediatric): Secondary | ICD-10-CM | POA: Diagnosis not present

## 2021-12-03 ENCOUNTER — Ambulatory Visit
Admission: RE | Admit: 2021-12-03 | Discharge: 2021-12-03 | Disposition: A | Discharge status: Home / Self Care | Payer: Medicare Other | Source: Ambulatory Visit | Attending: Family Medicine | Admitting: Family Medicine

## 2021-12-03 DIAGNOSIS — Z87891 Personal history of nicotine dependence: Secondary | ICD-10-CM | POA: Diagnosis not present

## 2021-12-03 DIAGNOSIS — R918 Other nonspecific abnormal finding of lung field: Secondary | ICD-10-CM

## 2021-12-09 ENCOUNTER — Other Ambulatory Visit: Payer: Self-pay | Admitting: Registered Nurse

## 2021-12-09 ENCOUNTER — Encounter: Payer: Medicare Other | Attending: Physical Medicine & Rehabilitation | Admitting: Registered Nurse

## 2021-12-09 ENCOUNTER — Encounter: Payer: Self-pay | Admitting: Registered Nurse

## 2021-12-09 ENCOUNTER — Encounter: Payer: Medicare Other | Admitting: Registered Nurse

## 2021-12-09 VITALS — BP 122/80 | HR 52 | Ht 64.0 in | Wt 215.0 lb

## 2021-12-09 DIAGNOSIS — G894 Chronic pain syndrome: Secondary | ICD-10-CM | POA: Diagnosis not present

## 2021-12-09 DIAGNOSIS — Z79891 Long term (current) use of opiate analgesic: Secondary | ICD-10-CM

## 2021-12-09 DIAGNOSIS — Z5181 Encounter for therapeutic drug level monitoring: Secondary | ICD-10-CM

## 2021-12-09 DIAGNOSIS — M62838 Other muscle spasm: Secondary | ICD-10-CM

## 2021-12-09 DIAGNOSIS — R001 Bradycardia, unspecified: Secondary | ICD-10-CM

## 2021-12-09 DIAGNOSIS — G629 Polyneuropathy, unspecified: Secondary | ICD-10-CM | POA: Diagnosis not present

## 2021-12-09 DIAGNOSIS — M4807 Spinal stenosis, lumbosacral region: Secondary | ICD-10-CM | POA: Diagnosis not present

## 2021-12-09 DIAGNOSIS — M2550 Pain in unspecified joint: Secondary | ICD-10-CM | POA: Diagnosis not present

## 2021-12-09 DIAGNOSIS — M961 Postlaminectomy syndrome, not elsewhere classified: Secondary | ICD-10-CM | POA: Diagnosis not present

## 2021-12-09 MED ORDER — MORPHINE SULFATE 15 MG PO TABS: 15.0000 mg | ORAL_TABLET | Freq: Two times a day (BID) | ORAL | 0 refills | Status: DC | PRN

## 2021-12-09 MED ORDER — MORPHINE SULFATE ER 15 MG PO TBCR: 15.0000 mg | EXTENDED_RELEASE_TABLET | Freq: Three times a day (TID) | ORAL | 0 refills | Status: DC

## 2021-12-09 NOTE — Progress Notes (Signed)
Subjective:    Patient ID: [name] [name], female    DOB: 12-06-1955, 66 y.o.   MRN: [id number]  HPI: [name] [name] is a 66 y.o. female who returns for follow up appointment for chronic pain and medication refill. She states her pain is located in her left thumb, index finger and ring finger with tingling and burning. She also reports lower back pain and generalized joint pain. [name] [name] states she has an appointment on the 27th with Dr Lorin Mercy. She rates her pain 7. Her  current exercise regime is walking and performing stretching exercises.  [name] [name] Morphine equivalent is 75.00 MME.   UDS ordered today.      Pain Inventory Average Pain 7 Pain Right Now 7 My pain is constant, stabbing, and aching  In the last 24 hours, has pain interfered with the following? General activity 7 Relation with others 7 Enjoyment of life 3 What TIME of day is your pain at its worst? morning , daytime, and night Sleep (in general) Poor  Pain is worse with: walking, inactivity, and standing Pain improves with: rest, heat/ice, medication, and spinal cord stimulator  Relief from Meds: 1  Family History  Problem Relation Age of Onset   Hyperlipidemia Mother    Heart attack Mother    Heart disease Mother 33       CHF; CAD   Hypertension Mother    Heart failure Mother    Diabetes Sister    Lupus Sister    Kidney failure Sister    Hyperlipidemia Sister    Hypertension Sister    Stroke Maternal Aunt    Hypertension Brother    Colon cancer Neg Hx    Rectal cancer Neg Hx    Breast cancer Neg Hx    Social History   Socioeconomic History   Marital status: Widowed    Spouse name: Not on file   Number of children: 3   Years of education: Not on file   Highest education level: Not on file  Occupational History   Occupation: disability    Comment: 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  Tobacco Use   Smoking status: Former    Packs/day: 0.50    Years: 20.00    Total pack years: 10.00    Types:  Cigarettes    Quit date: 07/21/2008    Years since quitting: 13.3   Smokeless tobacco: Never  Vaping Use   Vaping Use: Never used  Substance and Sexual Activity   Alcohol use: No   Drug use: No   Sexual activity: Not on file  Other Topics Concern   Not on file  Social History Narrative   Marital status: widowed since 2002; not dating which is bad in 2018      Children: 3 children (45, 32, 40); 13 grandchildren; 1 gg      Employment: disability since 2009; retail      Lives: alone in [place] in apartment      Tobacco: quit in 2011      Alcohol: none      Drugs: none      Exercise: minimal in 2018      ADLs: independent with ADLs; drives      Seatbelt: 100%; no texting.   Social Determinants of Health   Financial Resource Strain: Not on file  Food Insecurity: Not on file  Transportation Needs: Not on file  Physical Activity: Not on file  Stress: Not on file  Social Connections: Not on file  Past Surgical History:  Procedure Laterality Date   ABDOMINAL HYSTERECTOMY  1982   DUB; ovaries intact   bladder surg  1992   bladder retraction   BREAST BIOPSY     BREAST REDUCTION SURGERY  1984   CARDIAC CATHETERIZATION  36's   [name]   COLON SURGERY  05/11/11   Duke Lysis of adhesions Crohn's   MASS EXCISION Left 01/03/2018   Procedure: EXCISION LEFT UPPER BACK LIPOMA ERAS PATH;  Surgeon: Erroll Luna, MD;  Location: [facility];  Service: General;  Laterality: Left;   REDUCTION MAMMAPLASTY     SPINE SURGERY     5 total (1 upper, 4 lumbar)   SPINE SURGERY     nerve stimulator on right side   Past Surgical History:  Procedure Laterality Date   ABDOMINAL HYSTERECTOMY  1982   DUB; ovaries intact   bladder surg  1992   bladder retraction   BREAST BIOPSY     BREAST REDUCTION SURGERY  1984   CARDIAC CATHETERIZATION  1990's   [name]   COLON SURGERY  05/11/11   Duke Lysis of adhesions Crohn's   MASS EXCISION Left 01/03/2018   Procedure:  EXCISION LEFT UPPER BACK LIPOMA ERAS PATH;  Surgeon: Erroll Luna, MD;  Location: [facility];  Service: General;  Laterality: Left;   REDUCTION MAMMAPLASTY     SPINE SURGERY     5 total (1 upper, 4 lumbar)   SPINE SURGERY     nerve stimulator on right side   Past Medical History:  Diagnosis Date   Anemia    Blood transfusion without reported diagnosis    Crohn's disease ([place])    Disorder of sacroiliac joint    Exertional dyspnea 05/28/2021   Hyperlipidemia    Hypertension    Ischemic colitis ([place])    Lumbago    Lumbar post-laminectomy syndrome    Lumbosacral neuritis    Lumbosacral radiculitis    OSA (obstructive sleep apnea) 05/28/2021   Sciatica    There were no vitals taken for this visit.  Opioid Risk Score:   Fall Risk Score:  `1  Depression screen [facility] 2/9     10/18/2021    8:29 AM 09/15/2021    8:56 AM 08/18/2021    9:52 AM 07/21/2021    8:35 AM 06/22/2021    9:47 AM 06/01/2021    9:00 AM 04/21/2021    9:00 AM  Depression screen PHQ 2/9  Decreased Interest 0 0 1 1 0 0 0  Down, Depressed, Hopeless 0 0 0 0 0 0 0  PHQ - 2 Score 0 0 1 1 0 0 0    Review of Systems  Musculoskeletal:        Pain in right shoulder, both hands & left lower arm  All other systems reviewed and are negative.      Objective:   Physical Exam Vitals and nursing note reviewed.  Constitutional:      Appearance: Normal appearance.  Cardiovascular:     Rate and Rhythm: Normal rate and regular rhythm.     Pulses: Normal pulses.     Heart sounds: Normal heart sounds.  Pulmonary:     Effort: Pulmonary effort is normal.     Breath sounds: Normal breath sounds.  Musculoskeletal:     Cervical back: Normal range of motion and neck supple.     Comments: Normal Muscle Bulk and Muscle Testing Reveals:  Upper Extremities: Full ROM and Muscle Strength on  Right 3/5  Left 4/5 Lumbar Hypersensitivity Lower Extremities: Full ROM and Muscle Strength 5/5 Arises from Table Slowly Narrow  Based  Gait     Skin:    General: Skin is warm and dry.  Neurological:     Mental Status: She is alert and oriented to person, place, and time.  Psychiatric:        Mood and Affect: Mood normal.        Behavior: Behavior normal.         Assessment & Plan:  1.Lumbar Postlaminectomy/ Spinal Stenosis Lumbar Region/ Post-Op Pain/Low back pain with radiating symptoms in L3-4 distribution:  continues to be limited by pain. Refilled:  MSIR 15 mg one tablet twice a day as needed for moderate to sever pain #60 and  Morphine Sulfate ER 15 mg Q 8 hours. 12/09/2021. We will continue the opioid monitoring program, this consists of regular clinic visits, examinations, urine drug screen, pill counts as well as use of [place] Controlled Substance Reporting system. A 12 month History has been reviewed on the [place] Controlled Substance Reporting System on 10/18/2020. Encouraged to Continue exercise regime.   2. Lumbar Radiculopathy: No complaints today. Continue Topamax:.Continue to Monitor 12/09/2021 3.Chronic  Right Knee Pain: No complaints today.  Continue with Ice Therapy and  Voltaren Gel. 12/09/2021 4. Muscle Spasm: Continue current treatment: Tizanidine. 12/09/2021  5. Lipoma: S/P Left  Lipoma Excision of upper back  By Dr. Brantley Stage on 01/03/2018. Surgery Following. Continue to Monitor. 12/09/2021.   6. Paresthesia of Skin: Continue Topamax. Continue to monitor. 12/09/2021 7. Adhesive Capsulitis of Right Shoulder:Right Shoulder Tendonitis Pain:  S/P Right Shoulder Cortisone Injection with Dr Letta Pate, with good relief noted. Continue to monitor. Continue HEP as Tolerated. 12/09/2021  8. Polyarthralgia: Continue HEP as Tolerated. Continue to Monitor.12/09/2021  9. Bradycardia: Apical Pulse check:  Ms. Spadaccini states Cardiology is following. Continue to Monitor. 12/09/2021   F/U in 1 month      `

## 2021-12-12 ENCOUNTER — Other Ambulatory Visit: Payer: Self-pay | Admitting: Registered Nurse

## 2021-12-14 ENCOUNTER — Ambulatory Visit (INDEPENDENT_AMBULATORY_CARE_PROVIDER_SITE_OTHER): Payer: Medicare Other

## 2021-12-14 ENCOUNTER — Ambulatory Visit (INDEPENDENT_AMBULATORY_CARE_PROVIDER_SITE_OTHER): Payer: Medicare Other | Admitting: Orthopaedic Surgery

## 2021-12-14 ENCOUNTER — Encounter: Payer: Self-pay | Admitting: Orthopaedic Surgery

## 2021-12-14 VITALS — BP 137/59 | HR 51 | Ht 64.0 in | Wt 215.0 lb

## 2021-12-14 DIAGNOSIS — Z981 Arthrodesis status: Secondary | ICD-10-CM

## 2021-12-14 DIAGNOSIS — M65331 Trigger finger, right middle finger: Secondary | ICD-10-CM | POA: Diagnosis not present

## 2021-12-14 DIAGNOSIS — M542 Cervicalgia: Secondary | ICD-10-CM

## 2021-12-14 MED ORDER — METHYLPREDNISOLONE ACETATE 40 MG/ML IJ SUSP
20.0000 mg | INTRAMUSCULAR | Status: AC | PRN
  Administered 2021-12-14: 20 mg

## 2021-12-14 MED ORDER — BUPIVACAINE HCL 0.25 % IJ SOLN
0.5000 mL | INTRAMUSCULAR | Status: AC | PRN
  Administered 2021-12-14: .5 mL

## 2021-12-14 MED ORDER — LIDOCAINE HCL 1 % IJ SOLN
1.0000 mL | INTRAMUSCULAR | Status: AC | PRN
  Administered 2021-12-14: 1 mL

## 2021-12-15 LAB — TOXASSURE SELECT,+ANTIDEPR,UR

## 2021-12-16 ENCOUNTER — Telehealth: Payer: Self-pay | Admitting: *Deleted

## 2021-12-16 NOTE — Telephone Encounter (Signed)
Urine drug screen for this encounter is consistent for prescribed medication 

## 2021-12-31 ENCOUNTER — Encounter: Payer: Self-pay | Admitting: Physical Medicine & Rehabilitation

## 2021-12-31 ENCOUNTER — Encounter: Payer: Medicare Other | Attending: Physical Medicine & Rehabilitation | Admitting: Physical Medicine & Rehabilitation

## 2021-12-31 VITALS — BP 137/82 | HR 58 | Ht 64.0 in | Wt 215.0 lb

## 2021-12-31 DIAGNOSIS — M533 Sacrococcygeal disorders, not elsewhere classified: Secondary | ICD-10-CM | POA: Diagnosis not present

## 2021-12-31 MED ORDER — MORPHINE SULFATE ER 15 MG PO TBCR: 15.0000 mg | EXTENDED_RELEASE_TABLET | Freq: Three times a day (TID) | ORAL | 0 refills | Status: DC

## 2021-12-31 MED ORDER — MORPHINE SULFATE 15 MG PO TABS: 15.0000 mg | ORAL_TABLET | Freq: Two times a day (BID) | ORAL | 0 refills | Status: DC | PRN

## 2021-12-31 NOTE — Progress Notes (Signed)
Subjective:    Patient ID: [name] [name], female    DOB: Nov 04, 1955, 66 y.o.   MRN: [id number]  HPI Chief complaint low back pain as well as left hand numbness History of C4-6 ACDF, no recent falls or neck injury. CTS symp, mainly left-handed numbness and tingling in the thumb index and middle finger.  Has been seen by Dr. Lorin Mercy from orthopedic spine surgery.  She was placed in a wrist splint.  She feels like her symptoms are better when she uses that splint at night. In regards to her low back, she has mainly right-sided low back and buttock pain.  She has a history of lumbar fusion  Pain Inventory Average Pain 8 Pain Right Now 8 My pain is constant, sharp, and aching  In the last 24 hours, has pain interfered with the following? General activity 8 Relation with others 9 Enjoyment of life 9 What TIME of day is your pain at its worst? morning , daytime, and night Sleep (in general) Poor  Pain is worse with: walking, sitting, inactivity, and standing Pain improves with: rest, heat/ice, medication, and spinal stimulator  Relief from Meds: 1  Family History  Problem Relation Age of Onset   Hyperlipidemia Mother    Heart attack Mother    Heart disease Mother 22       CHF; CAD   Hypertension Mother    Heart failure Mother    Diabetes Sister    Lupus Sister    Kidney failure Sister    Hyperlipidemia Sister    Hypertension Sister    Stroke Maternal Aunt    Hypertension Brother    Colon cancer Neg Hx    Rectal cancer Neg Hx    Breast cancer Neg Hx    Social History   Socioeconomic History   Marital status: Widowed    Spouse name: Not on file   Number of children: 3   Years of education: Not on file   Highest education level: Not on file  Occupational History   Occupation: disability    Comment: 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  Tobacco Use   Smoking status: Former    Packs/day: 0.50    Years: 20.00    Total pack years: 10.00    Types: Cigarettes    Quit date: 07/21/2008     Years since quitting: 13.4   Smokeless tobacco: Never  Vaping Use   Vaping Use: Never used  Substance and Sexual Activity   Alcohol use: No   Drug use: No   Sexual activity: Not on file  Other Topics Concern   Not on file  Social History Narrative   Marital status: widowed since 2002; not dating which is bad in 2018      Children: 3 children (45, 89, 40); 13 grandchildren; 1 gg      Employment: disability since 2009; retail      Lives: alone in [place] in apartment      Tobacco: quit in 2011      Alcohol: none      Drugs: none      Exercise: minimal in 2018      ADLs: independent with ADLs; drives      Seatbelt: 100%; no texting.   Social Determinants of Health   Financial Resource Strain: Not on file  Food Insecurity: Not on file  Transportation Needs: Not on file  Physical Activity: Not on file  Stress: Not on file  Social Connections: Not on file   Past  Surgical History:  Procedure Laterality Date   ABDOMINAL HYSTERECTOMY  1982   DUB; ovaries intact   bladder surg  1992   bladder retraction   BREAST BIOPSY     BREAST REDUCTION SURGERY  1984   CARDIAC CATHETERIZATION  17's   [name]   COLON SURGERY  05/11/11   [facility] Lysis of adhesions Crohn's   MASS EXCISION Left 01/03/2018   Procedure: EXCISION LEFT UPPER BACK LIPOMA ERAS PATH;  Surgeon: Erroll Luna, MD;  Location: [facility];  Service: General;  Laterality: Left;   REDUCTION MAMMAPLASTY     SPINE SURGERY     5 total (1 upper, 4 lumbar)   SPINE SURGERY     nerve stimulator on right side   Past Surgical History:  Procedure Laterality Date   ABDOMINAL HYSTERECTOMY  1982   DUB; ovaries intact   bladder surg  1992   bladder retraction   BREAST BIOPSY     BREAST REDUCTION SURGERY  1984   CARDIAC CATHETERIZATION  1990's   [name]   COLON SURGERY  05/11/11   [facility] Lysis of adhesions Crohn's   MASS EXCISION Left 01/03/2018   Procedure: EXCISION LEFT UPPER BACK LIPOMA ERAS  PATH;  Surgeon: Erroll Luna, MD;  Location: [facility];  Service: General;  Laterality: Left;   REDUCTION MAMMAPLASTY     SPINE SURGERY     5 total (1 upper, 4 lumbar)   SPINE SURGERY     nerve stimulator on right side   Past Medical History:  Diagnosis Date   Anemia    Blood transfusion without reported diagnosis    Crohn's disease ([place])    Disorder of sacroiliac joint    Exertional dyspnea 05/28/2021   Hyperlipidemia    Hypertension    Ischemic colitis ([place])    Lumbago    Lumbar post-laminectomy syndrome    Lumbosacral neuritis    Lumbosacral radiculitis    OSA (obstructive sleep apnea) 05/28/2021   Sciatica    Ht '5\' 4"'$  (1.626 m)   Wt 215 lb (97.5 kg)   BMI 36.90 kg/m   Opioid Risk Score:   Fall Risk Score:  `1  Depression screen [facility] 2/9     12/31/2021   10:56 AM 12/09/2021    9:25 AM 10/18/2021    8:29 AM 09/15/2021    8:56 AM 08/18/2021    9:52 AM 07/21/2021    8:35 AM 06/22/2021    9:47 AM  Depression screen PHQ 2/9  Decreased Interest 0 0 0 0 1 1 0  Down, Depressed, Hopeless 0 0 0 0 0 0 0  PHQ - 2 Score 0 0 0 0 1 1 0    Review of Systems  Musculoskeletal:  Positive for back pain.       Right shoulder pain  All other systems reviewed and are negative.      Objective:   Physical Exam Vitals and nursing note reviewed.  Constitutional:      Appearance: She is obese.  HENT:     Head: Normocephalic and atraumatic.  Eyes:     Extraocular Movements: Extraocular movements intact.     Conjunctiva/sclera: Conjunctivae normal.     Pupils: Pupils are equal, round, and reactive to light.  Musculoskeletal:     Comments: Pain over the right PSIS I-S area Positive Faber's on the right side at the SI joint area positive thigh thrust test on the right side in the SI joint area Negative  straight leg raising No evidence of effusion at the knees bilaterally.  Skin:    General: Skin is warm and dry.  Neurological:     Mental Status: She is alert and  oriented to person, place, and time.     Comments: Motor strength is 5/5 bilateral hip flexor knee extensor ankle dorsiflexor Negative straight leg raising Ambulates with a cane no evidence of instability  Psychiatric:        Mood and Affect: Mood normal.        Behavior: Behavior normal.           Assessment & Plan:  #1.  Lumbar postlaminectomy syndrome she has had for lumbar surgeries including fusion.,  Current complaints of the right buttock and low back pain are most consistent with sacroiliac dysfunction we will schedule for injection under fluoroscopic guidance as a diagnostic/therapeutic tool We will continue MS Contin 15 mg every 12 hours as well as MS IR 15 mg twice daily as needed she also takes tizanidine 2 mg twice daily We will check pelvic films to look for any bony abnormalities prior to performing injection

## 2022-01-08 ENCOUNTER — Other Ambulatory Visit: Payer: Self-pay | Admitting: Family Medicine

## 2022-01-08 DIAGNOSIS — I10 Essential (primary) hypertension: Secondary | ICD-10-CM

## 2022-01-18 ENCOUNTER — Ambulatory Visit (INDEPENDENT_AMBULATORY_CARE_PROVIDER_SITE_OTHER): Payer: Medicare Other | Admitting: Orthopaedic Surgery

## 2022-01-18 ENCOUNTER — Encounter: Payer: Self-pay | Admitting: Orthopaedic Surgery

## 2022-01-18 VITALS — BP 145/79 | HR 50 | Ht 64.0 in | Wt 215.0 lb

## 2022-01-18 DIAGNOSIS — M65331 Trigger finger, right middle finger: Secondary | ICD-10-CM

## 2022-01-18 NOTE — Progress Notes (Signed)
Office Visit Note   Patient: [name] [name]           Date of Birth: 05-20-1956           MRN: [id number] Visit Date: 01/18/2022              Requested by: Gerrit Heck, MD [address] [place],  [place] [address] PCP: Gerrit Heck, MD   Assessment & Plan: Visit Diagnoses:  1. Trigger finger, right middle finger     Plan: We will proceed with nerve conduction velocities for evaluation of bilateral carpal tunnel syndrome left hand worse than right.  She can return and we can discuss outpatient treatment options for her hand numbness and finger triggering.  Follow-Up Instructions: No follow-ups on file.   Orders:  No orders of the defined types were placed in this encounter.  No orders of the defined types were placed in this encounter.     Procedures: No procedures performed   Clinical Data: No additional findings.   Subjective: Chief Complaint  Patient presents with   Right Middle Finger - Follow-up    HPI 66 year old female returns with persistent problems with numbness in her hands in the median distribution left greater than right.  She states the splint at night has helped the left hand.  Right middle trigger finger injection has helped some but she is starting to have catching again.  Hand numbness bothers her.  She has had previous cervical fusion which is solid at C4-5 and also C5-6.  No numbness in the ulnar one half fingers.  No gait disturbance.  She still using the brace at night for the left hand.  Review of Systems previous lumbar surgery two-level cervical fusion.  Crohn's disease.  All the systems noncontributory HPI.   Objective: Vital Signs: BP (!) 145/79   Pulse (!) 50   Ht '5\' 4"'$  (1.626 m)   Wt 215 lb (97.5 kg)   BMI 36.90 kg/m   Physical Exam Constitutional:      Appearance: She is well-developed.  HENT:     Head: Normocephalic.     Right Ear: External ear normal.     Left Ear: External ear normal. There is no impacted  cerumen.  Eyes:     Pupils: Pupils are equal, round, and reactive to light.  Neck:     Thyroid: No thyromegaly.     Trachea: No tracheal deviation.  Cardiovascular:     Rate and Rhythm: Normal rate.  Pulmonary:     Effort: Pulmonary effort is normal.  Abdominal:     Palpations: Abdomen is soft.  Musculoskeletal:     Cervical back: No rigidity.  Skin:    General: Skin is warm and dry.  Neurological:     Mental Status: She is alert and oriented to person, place, and time.  Psychiatric:        Behavior: Behavior normal.     Ortho Exam bilateral positive Tinel's at the wrist.  Tenderness over the right long finger A1 pulley and she is able to demonstrate active triggering.  Left hand fingertips lack 1 cm touching distal palmar crease with tenderness over the carpal canal positive Phalen's left greater than right.  Specialty Comments:  No specialty comments available.  Imaging: No results found.   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   Allergic rhinitis 04/15/2019   Chronic pain 04/15/2019   Memory difficulties 04/15/2019   Pre-diabetes 04/15/2019   Class 2 severe obesity due to excess calories with serious comorbidity and body mass index (BMI) of 36.0 to 36.9 in adult [facility]) 04/04/2017   Degeneration of lumbar intervertebral disc 11/28/2014   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/28/2014   History of lumbar fusion 11/28/2014   Hypokalemia 11/13/2014   Neuropathy 11/13/2014   Primary insomnia 11/13/2014   Postlaminectomy syndrome, cervical region 12/09/2013   Heart palpitations 10/19/2012    Dizziness 04/22/2012   Hyperlipemia 04/09/2012   Cervical pain 01/26/2012   Postlaminectomy syndrome, lumbar region 08/11/2011   Crohn's disease ([place])    Essential hypertension 09/02/2008   Past Medical History:  Diagnosis Date   Anemia    Blood transfusion without reported diagnosis    Crohn's disease ([place])    Disorder of sacroiliac joint    Exertional dyspnea 05/28/2021   Hyperlipidemia    Hypertension    Ischemic colitis ([place])    Lumbago    Lumbar post-laminectomy syndrome    Lumbosacral neuritis    Lumbosacral radiculitis    OSA (obstructive sleep apnea) 05/28/2021   Sciatica     Family History  Problem Relation Age of Onset   Hyperlipidemia Mother    Heart attack Mother    Heart disease Mother 26       CHF; CAD   Hypertension Mother    Heart failure Mother    Diabetes Sister    Lupus Sister    Kidney failure Sister    Hyperlipidemia Sister    Hypertension Sister    Stroke Maternal Aunt    Hypertension Brother    Colon cancer Neg Hx    Rectal cancer Neg Hx    Breast cancer Neg Hx     Past Surgical History:  Procedure Laterality Date   ABDOMINAL HYSTERECTOMY  1982   DUB; ovaries intact   bladder surg  1992   bladder retraction   BREAST BIOPSY     BREAST REDUCTION SURGERY  1984   CARDIAC CATHETERIZATION  1990's   [name]   COLON SURGERY  05/11/11   [facility] Lysis of adhesions Crohn's   MASS EXCISION Left 01/03/2018   Procedure: EXCISION LEFT UPPER BACK LIPOMA ERAS PATH;  Surgeon: Erroll Luna, MD;  Location: [facility];  Service: General;  Laterality: Left;   REDUCTION MAMMAPLASTY     SPINE SURGERY     5 total (1 upper, 4 lumbar)   SPINE SURGERY     nerve stimulator on right side   Social History   Occupational History   Occupation: disability    Comment: 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  Tobacco Use   Smoking status: Former    Packs/day: 0.50    Years: 20.00    Total pack years: 10.00    Types: Cigarettes    Quit date: 07/21/2008     Years since quitting: 13.5   Smokeless tobacco: Never  Vaping Use   Vaping Use: Never used  Substance and Sexual Activity   Alcohol use: No   Drug use: No   Sexual activity: Not on file

## 2022-01-21 ENCOUNTER — Telehealth: Payer: Self-pay | Admitting: Physical Medicine and Rehabilitation

## 2022-01-21 NOTE — Telephone Encounter (Signed)
Patient returned call asked for a call back as soon as possible.   [phone]

## 2022-01-25 ENCOUNTER — Encounter: Payer: Self-pay | Admitting: Registered Nurse

## 2022-01-25 ENCOUNTER — Encounter: Payer: Medicare Other | Attending: Physical Medicine & Rehabilitation | Admitting: Registered Nurse

## 2022-01-25 VITALS — BP 121/75 | HR 56 | Ht 64.0 in | Wt 224.2 lb

## 2022-01-25 DIAGNOSIS — M961 Postlaminectomy syndrome, not elsewhere classified: Secondary | ICD-10-CM

## 2022-01-25 DIAGNOSIS — Z5181 Encounter for therapeutic drug level monitoring: Secondary | ICD-10-CM | POA: Diagnosis not present

## 2022-01-25 DIAGNOSIS — G894 Chronic pain syndrome: Secondary | ICD-10-CM

## 2022-01-25 DIAGNOSIS — G629 Polyneuropathy, unspecified: Secondary | ICD-10-CM

## 2022-01-25 DIAGNOSIS — Z79891 Long term (current) use of opiate analgesic: Secondary | ICD-10-CM | POA: Diagnosis not present

## 2022-01-25 DIAGNOSIS — M62838 Other muscle spasm: Secondary | ICD-10-CM

## 2022-01-25 DIAGNOSIS — M4807 Spinal stenosis, lumbosacral region: Secondary | ICD-10-CM

## 2022-01-25 DIAGNOSIS — M533 Sacrococcygeal disorders, not elsewhere classified: Secondary | ICD-10-CM | POA: Diagnosis not present

## 2022-01-25 DIAGNOSIS — R001 Bradycardia, unspecified: Secondary | ICD-10-CM

## 2022-01-25 MED ORDER — MORPHINE SULFATE ER 15 MG PO TBCR: 15.0000 mg | EXTENDED_RELEASE_TABLET | Freq: Three times a day (TID) | ORAL | 0 refills | Status: DC

## 2022-01-25 MED ORDER — MORPHINE SULFATE 15 MG PO TABS: 15.0000 mg | ORAL_TABLET | Freq: Two times a day (BID) | ORAL | 0 refills | Status: DC | PRN

## 2022-01-25 NOTE — Progress Notes (Signed)
Subjective:    Patient ID: [name] [name], female    DOB: 1955-10-01, 66 y.o.   MRN: [id number]  HPI: [name] [name] is a 66 y.o. female who returns for follow up appointment for chronic pain and medication refill. She states her pain is located in her lower back and sacral pain. She also reports right tingling and numbness in her right thumb, right index finger and right middle finger, she states Dr Lorin Mercy is following and she is scheduled for EMG. She rates her pain 8. Her current exercise regime is walking and performing stretching exercises.  [name] [name] equivalent is 75.00 MME.   Last UDS was Performed on 12/09/2021, it was consistent.     Pain Inventory Average Pain 8 Pain Right Now 8 My pain is constant, sharp, and aching  In the last 24 hours, has pain interfered with the following? General activity 8 Relation with others 8 Enjoyment of life 8 What TIME of day is your pain at its worst? morning  and night Sleep (in general) Poor  Pain is worse with: walking, inactivity, and standing Pain improves with: medication Relief from Meds: 1  Family History  Problem Relation Age of Onset   Hyperlipidemia Mother    Heart attack Mother    Heart disease Mother 58       CHF; CAD   Hypertension Mother    Heart failure Mother    Diabetes Sister    Lupus Sister    Kidney failure Sister    Hyperlipidemia Sister    Hypertension Sister    Stroke Maternal Aunt    Hypertension Brother    Colon cancer Neg Hx    Rectal cancer Neg Hx    Breast cancer Neg Hx    Social History   Socioeconomic History   Marital status: Widowed    Spouse name: Not on file   Number of children: 3   Years of education: Not on file   Highest education level: Not on file  Occupational History   Occupation: disability    Comment: 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  Tobacco Use   Smoking status: Former    Packs/day: 0.50    Years: 20.00    Total pack years: 10.00    Types: Cigarettes    Quit  date: 07/21/2008    Years since quitting: 13.5   Smokeless tobacco: Never  Vaping Use   Vaping Use: Never used  Substance and Sexual Activity   Alcohol use: No   Drug use: No   Sexual activity: Not on file  Other Topics Concern   Not on file  Social History Narrative   Marital status: widowed since 2002; not dating which is bad in 2018      Children: 3 children (45, 18, 40); 13 grandchildren; 1 gg      Employment: disability since 2009; retail      Lives: alone in [place] in apartment      Tobacco: quit in 2011      Alcohol: none      Drugs: none      Exercise: minimal in 2018      ADLs: independent with ADLs; drives      Seatbelt: 100%; no texting.   Social Determinants of Health   Financial Resource Strain: Not on file  Food Insecurity: Not on file  Transportation Needs: Not on file  Physical Activity: Not on file  Stress: Not on file  Social Connections: Not on file   Past  Surgical History:  Procedure Laterality Date   ABDOMINAL HYSTERECTOMY  1982   DUB; ovaries intact   bladder surg  1992   bladder retraction   BREAST BIOPSY     BREAST REDUCTION SURGERY  1984   CARDIAC CATHETERIZATION  67's   [name]   COLON SURGERY  05/11/11   [facility] Lysis of adhesions Crohn's   MASS EXCISION Left 01/03/2018   Procedure: EXCISION LEFT UPPER BACK LIPOMA ERAS PATH;  Surgeon: Erroll Luna, MD;  Location: [facility];  Service: General;  Laterality: Left;   REDUCTION MAMMAPLASTY     SPINE SURGERY     5 total (1 upper, 4 lumbar)   SPINE SURGERY     nerve stimulator on right side   Past Surgical History:  Procedure Laterality Date   ABDOMINAL HYSTERECTOMY  1982   DUB; ovaries intact   bladder surg  1992   bladder retraction   BREAST BIOPSY     BREAST REDUCTION SURGERY  1984   CARDIAC CATHETERIZATION  1990's   [name]   COLON SURGERY  05/11/11   [facility] Lysis of adhesions Crohn's   MASS EXCISION Left 01/03/2018   Procedure: EXCISION LEFT UPPER BACK  LIPOMA ERAS PATH;  Surgeon: Erroll Luna, MD;  Location: [facility];  Service: General;  Laterality: Left;   REDUCTION MAMMAPLASTY     SPINE SURGERY     5 total (1 upper, 4 lumbar)   SPINE SURGERY     nerve stimulator on right side   Past Medical History:  Diagnosis Date   Anemia    Blood transfusion without reported diagnosis    Crohn's disease ([place])    Disorder of sacroiliac joint    Exertional dyspnea 05/28/2021   Hyperlipidemia    Hypertension    Ischemic colitis ([place])    Lumbago    Lumbar post-laminectomy syndrome    Lumbosacral neuritis    Lumbosacral radiculitis    OSA (obstructive sleep apnea) 05/28/2021   Sciatica    BP 121/75   Pulse (!) 55   Ht '5\' 4"'$  (1.626 m)   Wt 224 lb 3.2 oz (101.7 kg)   SpO2 95%   BMI 38.48 kg/m   Opioid Risk Score:   Fall Risk Score:  `1  Depression screen [facility] 2/9     01/25/2022    9:27 AM 12/31/2021   10:56 AM 12/09/2021    9:25 AM 10/18/2021    8:29 AM 09/15/2021    8:56 AM 08/18/2021    9:52 AM 07/21/2021    8:35 AM  Depression screen PHQ 2/9  Decreased Interest 0 0 0 0 0 1 1  Down, Depressed, Hopeless 0 0 0 0 0 0 0  PHQ - 2 Score 0 0 0 0 0 1 1     Review of Systems  Constitutional: Negative.   HENT: Negative.    Eyes: Negative.   Respiratory: Negative.    Cardiovascular: Negative.   Gastrointestinal: Negative.   Endocrine: Negative.   Genitourinary: Negative.   Musculoskeletal:  Positive for back pain.  Skin: Negative.   Allergic/Immunologic: Negative.   Neurological: Negative.   Hematological: Negative.   Psychiatric/Behavioral:  Positive for sleep disturbance.       Objective:   Physical Exam Vitals and nursing note reviewed.  Constitutional:      Appearance: Normal appearance.  Cardiovascular:     Rate and Rhythm: Regular rhythm. Bradycardia present.     Pulses: Normal pulses.  Heart sounds: Normal heart sounds.  Pulmonary:     Effort: Pulmonary effort is normal.     Breath sounds: Normal  breath sounds.  Musculoskeletal:     Cervical back: Normal range of motion and neck supple.     Comments: Normal Muscle Bulk and Muscle Testing Reveals:  Upper Extremities:Full  ROM and Muscle Strength 5/5 Lumbar Paraspinal Tenderness: L-4-L-5 Sacral Tenderness Lower Extremities: Full ROM and Muscle Strength 5/5 Arises from Table Slowly  Narrow Based Gait     Skin:    General: Skin is warm and dry.  Neurological:     Mental Status: She is alert and oriented to person, place, and time.  Psychiatric:        Mood and Affect: Mood normal.        Behavior: Behavior normal.         Assessment & Plan:  1.Lumbar Postlaminectomy/ Spinal Stenosis Lumbar Region/ Post-Op Pain/Low back pain with radiating symptoms in L3-4 distribution:  continues to be limited by pain. Refilled:  MSIR 15 mg one tablet twice a day as needed for moderate to sever pain #60 and  [name] Sulfate ER 15 mg Q 8 hours. 01/25/2022. We will continue the opioid monitoring program, this consists of regular clinic visits, examinations, urine drug screen, pill counts as well as use of [place] Controlled Substance Reporting system. A 12 month History has been reviewed on the [place] Controlled Substance Reporting System on 10/18/2020. Encouraged to Continue exercise regime.   2. Lumbar Radiculopathy: No complaints today. Continue Topamax:.Continue to Monitor 01/25/2022 3.Chronic  Right Knee Pain: No complaints today.  Continue with Ice Therapy and  Voltaren Gel. 01/25/2022 4. Muscle Spasm: Continue current treatment: Tizanidine. 01/25/2022  5. Lipoma: S/P Left  Lipoma Excision of upper back  By Dr. Brantley Stage on 01/03/2018. Surgery Following. Continue to Monitor. 01/25/2022.   6. Paresthesia of Skin: Continue Topamax. Continue to monitor. 01/25/2022 7. Adhesive Capsulitis of Right Shoulder:Right Shoulder Tendonitis Pain:  S/P Right Shoulder Cortisone Injection with Dr Letta Pate, with good relief noted. Continue to  monitor. Continue HEP as Tolerated. 01/25/2022  8. Polyarthralgia: Continue HEP as Tolerated. Continue to Monitor.01/25/2022  9. Bradycardia: Apical Pulse check:  Ms. Comes states Cardiology is following. Continue to Monitor. 01/25/2022  10. Sacral Pain: Scheduled for Sacroiliac Injection with Dr Letta Pate on 01/25/2022.   F/U in 1 month      `

## 2022-01-27 NOTE — Progress Notes (Signed)
    SUBJECTIVE:   CHIEF COMPLAINT / HPI:   Pre diabetes f/u Last A1c elevated to 6.3 still in prediabetic range. Today A1c is 6.1. Patient is on a atorvastatin 40. She is not interested in nutritionist referral  HTN On metoprolol succinate 12.5 mg daily, chlorthalidone 25 mg daily.  Does not check her blood pressure at home.  148/71 initially which came down to 135/60.  Lung nodules Night sweats for many years.  No cough, no hemoptysis, no unintentional weight loss. No smoking since 2011-started when 17 and quit in 2011. "Lung-RADS 3, probably benign findings. Short-term follow-up in 6 months is recommended with repeat low-dose chest CT without contrast" She is interested in a pulmonology referral.   Bradycardia Followed by cardiology and has appointment coming up next month.  Asymptomatic.  PERTINENT  PMH / PSH: discussed above  OBJECTIVE:   BP 135/60   Pulse (!) 54   Ht '5\' 4"'$  (1.626 m)   Wt 220 lb 12.8 oz (100.2 kg)   SpO2 98%   BMI 37.90 kg/m   General: NAD, awake, alert, responsive to questions Head: Normocephalic atraumatic CV: Regular rate and rhythm no murmurs rubs or gallops Respiratory: Clear to ausculation bilaterally, no wheezes rales or crackles, chest rises symmetrically,  no increased work of breathing Abdomen: Soft, non-tender, non-distended, normoactive bowel sounds  Extremities: Moves upper and lower extremities freely, no edema in LE  ASSESSMENT/PLAN:   Essential hypertension 135/60 on repeat manual.  -Continue chlorthalidone 25 mg daily -Metoprolol succinate 12.5 mg daily  Lung nodules Repeat CT scan in 6 months from last 1 in June.  Needs 1 in December of this year.  Discussed that pulmonology referral not necessary currently after discussing with Dr. McDiarmid as well.  Patient is asymptomatic and we are following CT scans.  No longer smoking.  Reassurance provided and that we will monitor and CT scans and in 3 months see if she has any  symptoms.  Pre-diabetes A1c 6.1 today.  Patient does not want nutritionist referral.  I discussed lifestyle modifications with her.  Bradycardia She is on low-dose metoprolol currently.  I discussed that medications and age can decrease heart rates. Follow up with cardiology    Gerrit Heck, MD [place]

## 2022-01-28 ENCOUNTER — Ambulatory Visit (INDEPENDENT_AMBULATORY_CARE_PROVIDER_SITE_OTHER): Payer: Medicare Other | Admitting: Student

## 2022-01-28 ENCOUNTER — Encounter: Payer: Self-pay | Admitting: Student

## 2022-01-28 VITALS — BP 135/60 | HR 54 | Ht 64.0 in | Wt 220.8 lb

## 2022-01-28 DIAGNOSIS — R918 Other nonspecific abnormal finding of lung field: Secondary | ICD-10-CM | POA: Diagnosis not present

## 2022-01-28 DIAGNOSIS — R7303 Prediabetes: Secondary | ICD-10-CM

## 2022-01-28 DIAGNOSIS — I10 Essential (primary) hypertension: Secondary | ICD-10-CM

## 2022-01-28 DIAGNOSIS — R001 Bradycardia, unspecified: Secondary | ICD-10-CM | POA: Diagnosis not present

## 2022-01-28 LAB — POCT GLYCOSYLATED HEMOGLOBIN (HGB A1C): HbA1c, POC (controlled diabetic range): 6.1 % (ref 0.0–7.0)

## 2022-01-28 NOTE — Assessment & Plan Note (Signed)
135/60 on repeat manual.  -Continue chlorthalidone 25 mg daily -Metoprolol succinate 12.5 mg daily

## 2022-01-28 NOTE — Assessment & Plan Note (Signed)
A1c 6.1 today.  Patient does not want nutritionist referral.  I discussed lifestyle modifications with her.

## 2022-01-28 NOTE — Assessment & Plan Note (Signed)
Repeat CT scan in 6 months from last 1 in June.  Needs 1 in December of this year.  Discussed that pulmonology referral not necessary currently after discussing with Dr. McDiarmid as well.  Patient is asymptomatic and we are following CT scans.  No longer smoking.  Reassurance provided and that we will monitor and CT scans and in 3 months see if she has any symptoms.

## 2022-01-28 NOTE — Assessment & Plan Note (Signed)
She is on low-dose metoprolol currently.  I discussed that medications and age can decrease heart rates. Follow up with cardiology

## 2022-01-28 NOTE — Patient Instructions (Signed)
It was great to see you! Thank you for allowing me to participate in your care!   Our plans for today:  - Your A1c was 6.1-this is in the prediabetes range, we can refer you to a nutritionist if you are interested in making diet changes -Your low pulse rate is likely secondary to age and also medications that you are on that can also lower these, follow-up with cardiology -You do not need a pulmonology referral for your lung nodules, we will continue to monitor on low-dose CT scans -follow up in 3-4 months  Take care and seek immediate care sooner if you develop any concerns.  Gerrit Heck, MD

## 2022-02-09 ENCOUNTER — Encounter: Payer: Self-pay | Admitting: Physical Medicine and Rehabilitation

## 2022-02-09 ENCOUNTER — Ambulatory Visit (INDEPENDENT_AMBULATORY_CARE_PROVIDER_SITE_OTHER): Payer: Medicare Other | Admitting: Physical Medicine and Rehabilitation

## 2022-02-09 DIAGNOSIS — R202 Paresthesia of skin: Secondary | ICD-10-CM | POA: Diagnosis not present

## 2022-02-09 NOTE — Progress Notes (Unsigned)
[name] [name] - 66 y.o. female MRN [id number]  Date of birth: 07/10/1955  Office Visit Note: Visit Date: 02/09/2022 PCP: Gerrit Heck, MD Referred by: Marybelle Killings, MD  Subjective: Chief Complaint  Patient presents with   Right Hand - Numbness, Pain   Left Hand - Pain, Numbness   HPI:  [name] [name] is a 66 y.o. female who comes in today at the request of Dr. Rodell Perna for electrodiagnostic study of the Bilateral upper extremities.  Patient is Left hand dominant.   ROS Otherwise per HPI.  Assessment & Plan: Visit Diagnoses:    ICD-10-CM   1. Paresthesia of skin  R20.2 NCV with EMG (electromyography)      Plan: Impression: The above electrodiagnostic study is ABNORMAL and reveals evidence of:  a moderate to severe left median nerve entrapment at the wrist (carpal tunnel syndrome) affecting sensory and motor components.   a moderate right median nerve entrapment at the wrist (carpal tunnel syndrome) affecting sensory and motor components.   There is no significant electrodiagnostic evidence of any other focal nerve entrapment, brachial plexopathy or cervical radiculopathy.   Recommendations: 1.  Follow-up with referring physician. 2.  Continue current management of symptoms. 3.  Suggest surgical evaluation.  Meds & Orders: No orders of the defined types were placed in this encounter.   Orders Placed This Encounter  Procedures   NCV with EMG (electromyography)    Follow-up: Return in about 2 weeks (around 02/23/2022) for Rodell Perna, MD.   Procedures: No procedures performed  EMG & NCV Findings: Evaluation of the left median motor and the right median motor nerves showed prolonged distal onset latency (L6.6, R5.5 ms) and decreased conduction velocity (Elbow-Wrist, L44, R48 m/s).  The left median (across palm) sensory nerve showed prolonged distal peak latency (Wrist, 6.9 ms), reduced amplitude (9.6 V), and prolonged distal peak latency (Palm, 7.1 ms).   The right median (across palm) sensory nerve showed no response (Palm) and prolonged distal peak latency (5.1 ms).  All remaining nerves (as indicated in the following tables) were within normal limits.  Left vs. Right side comparison data for the median motor nerve indicates abnormal L-R latency difference (1.1 ms).  The ulnar motor nerve indicates abnormal L-R velocity difference (A Elbow-B Elbow, 33 m/s).  All remaining left vs. right side differences were within normal limits.    All examined muscles (as indicated in the following table) showed no evidence of electrical instability.    Impression: The above electrodiagnostic study is ABNORMAL and reveals evidence of:  a moderate to severe left median nerve entrapment at the wrist (carpal tunnel syndrome) affecting sensory and motor components.   a moderate right median nerve entrapment at the wrist (carpal tunnel syndrome) affecting sensory and motor components.   There is no significant electrodiagnostic evidence of any other focal nerve entrapment, brachial plexopathy or cervical radiculopathy.   Recommendations: 1.  Follow-up with referring physician. 2.  Continue current management of symptoms. 3.  Suggest surgical evaluation.  ___________________________ Laurence Spates FAAPMR Board Certified, American Board of Physical Medicine and Rehabilitation    Nerve Conduction Studies Anti Sensory Summary Table   Stim Site NR Peak (ms) Norm Peak (ms) P-T Amp (V) Norm P-T Amp Site1 Site2 Delta-P (ms) Dist (cm) Vel (m/s) Norm Vel (m/s)  Left Median Acr Palm Anti Sensory (2nd Digit)  31.2C  Wrist    *6.9 <3.6 *9.6 >10 Wrist Palm 0.2 0.0    Palm    *7.1 <  2.0 5.6         Right Median Acr Palm Anti Sensory (2nd Digit)  30.4C  Wrist    *5.1 <3.6 17.0 >10 Wrist Palm  0.0    Palm *NR  <2.0          Left Radial Anti Sensory (Base 1st Digit)  31.3C  Wrist    2.3 <3.1 39.1  Wrist Base 1st Digit 2.3 0.0    Right Radial Anti Sensory (Base 1st  Digit)  31C  Wrist    2.3 <3.1 44.2  Wrist Base 1st Digit 2.3 0.0    Left Ulnar Anti Sensory (5th Digit)  31.4C  Wrist    3.3 <3.7 33.3 >15.0 Wrist 5th Digit 3.3 14.0 42 >38  Right Ulnar Anti Sensory (5th Digit)  31.3C  Wrist    3.3 <3.7 29.1 >15.0 Wrist 5th Digit 3.3 14.0 42 >38   Motor Summary Table   Stim Site NR Onset (ms) Norm Onset (ms) O-P Amp (mV) Norm O-P Amp Site1 Site2 Delta-0 (ms) Dist (cm) Vel (m/s) Norm Vel (m/s)  Left Median Motor (Abd Poll Brev)  31.3C  Wrist    *6.6 <4.2 5.5 >5 Elbow Wrist 4.6 20.2 *44 >50  Elbow    11.2  5.3         Right Median Motor (Abd Poll Brev)  31.5C  Wrist    *5.5 <4.2 6.3 >5 Elbow Wrist 4.3 20.5 *48 >50  Elbow    9.8  6.4         Left Ulnar Motor (Abd Dig Min)  31.4C  Wrist    3.0 <4.2 8.3 >3 B Elbow Wrist 3.1 19.0 61 >53  B Elbow    6.1  8.2  A Elbow B Elbow 1.5 10.0 67 >53  A Elbow    7.6  6.6         Right Ulnar Motor (Abd Dig Min)  32C  Wrist    2.9 <4.2 8.8 >3 B Elbow Wrist 3.1 19.5 63 >53  B Elbow    6.0  8.0  A Elbow B Elbow 1.0 10.0 100 >53  A Elbow    7.0  7.3          EMG   Side Muscle Nerve Root Ins Act Fibs Psw Amp Dur Poly Recrt Int Fraser Din Comment  Left Abd Poll Brev Median C8-T1 Nml Nml Nml Nml Nml 0 Nml Nml   Left 1stDorInt Ulnar C8-T1 Nml Nml Nml Nml Nml 0 Nml Nml   Left PronatorTeres Median C6-7 Nml Nml Nml Nml Nml 0 Nml Nml   Left Biceps Musculocut C5-6 Nml Nml Nml Nml Nml 0 Nml Nml   Left Deltoid Axillary C5-6 Nml Nml Nml Nml Nml 0 Nml Nml     Nerve Conduction Studies Anti Sensory Left/Right Comparison   Stim Site L Lat (ms) R Lat (ms) L-R Lat (ms) L Amp (V) R Amp (V) L-R Amp (%) Site1 Site2 L Vel (m/s) R Vel (m/s) L-R Vel (m/s)  Median Acr Palm Anti Sensory (2nd Digit)  31.2C  Wrist *6.9 *5.1 1.8 *9.6 17.0 43.5 Wrist Palm     Palm *7.1   5.6         Radial Anti Sensory (Base 1st Digit)  31.3C  Wrist 2.3 2.3 0.0 39.1 44.2 11.5 Wrist Base 1st Digit     Ulnar Anti Sensory (5th Digit)  31.4C  Wrist 3.3  3.3 0.0 33.3 29.1 12.6 Wrist 5th Digit 42 42 0  Motor Left/Right Comparison   Stim Site L Lat (ms) R Lat (ms) L-R Lat (ms) L Amp (mV) R Amp (mV) L-R Amp (%) Site1 Site2 L Vel (m/s) R Vel (m/s) L-R Vel (m/s)  Median Motor (Abd Poll Brev)  31.3C  Wrist *6.6 *5.5 *1.1 5.5 6.3 12.7 Elbow Wrist *44 *48 4  Elbow 11.2 9.8 1.4 5.3 6.4 17.2       Ulnar Motor (Abd Dig Min)  31.4C  Wrist 3.0 2.9 0.1 8.3 8.8 5.7 B Elbow Wrist 61 63 2  B Elbow 6.1 6.0 0.1 8.2 8.0 2.4 A Elbow B Elbow 67 100 *33  A Elbow 7.6 7.0 0.6 6.6 7.3 9.6          Waveforms:                      Clinical History: No specialty comments available.     Objective:  VS:  HT:    WT:   BMI:     BP:   HR: bpm  TEMP: ( )  RESP:  Physical Exam Musculoskeletal:        General: No swelling, tenderness or deformity.     Comments: Inspection reveals no atrophy of the bilateral APB or FDI or hand intrinsics. There is no swelling, color changes, allodynia or dystrophic changes. There is 5 out of 5 strength in the bilateral wrist extension, finger abduction and long finger flexion. There is intact sensation to light touch in all dermatomal and peripheral nerve distributions. There is a negative Froment's test bilaterally. There is a negative Tinel's test at the bilateral wrist and elbow. There is a negative Phalen's test bilaterally. There is a negative Hoffmann's test bilaterally.  Skin:    General: Skin is warm and dry.     Findings: No erythema or rash.  Neurological:     General: No focal deficit present.     Mental Status: She is alert and oriented to person, place, and time.     Motor: No weakness or abnormal muscle tone.     Coordination: Coordination normal.  Psychiatric:        Mood and Affect: Mood normal.        Behavior: Behavior normal.      Imaging: No results found.

## 2022-02-09 NOTE — Progress Notes (Signed)
Pt state pain in both hands. Pt state she has numbness in her thumb, pointer, middle and ring finger in her left hand. Pt state the pain comes from her finger to the palm of her hand up to her elbow. Pt state she has numbness in the fingers of her right hand but she has a knot on the anterior of her hand.Pt state she has trouble holding items because she end up dropping the items. Pt state she takes pain meds to help ease her pain. Pt state she left handed.  Numeric Pain Rating Scale and Functional Assessment Average Pain 5   In the last MONTH (on 0-10 scale) has pain interfered with the following?  1. General activity like being  able to carry out your everyday physical activities such as walking, climbing stairs, carrying groceries, or moving a chair?  Rating(7)    -BT,

## 2022-02-14 NOTE — Procedures (Unsigned)
EMG & NCV Findings: Evaluation of the left median motor and the right median motor nerves showed prolonged distal onset latency (L6.6, R5.5 ms) and decreased conduction velocity (Elbow-Wrist, L44, R48 m/s).  The left median (across palm) sensory nerve showed prolonged distal peak latency (Wrist, 6.9 ms), reduced amplitude (9.6 V), and prolonged distal peak latency (Palm, 7.1 ms).  The right median (across palm) sensory nerve showed no response (Palm) and prolonged distal peak latency (5.1 ms).  All remaining nerves (as indicated in the following tables) were within normal limits.  Left vs. Right side comparison data for the median motor nerve indicates abnormal L-R latency difference (1.1 ms).  The ulnar motor nerve indicates abnormal L-R velocity difference (A Elbow-B Elbow, 33 m/s).  All remaining left vs. right side differences were within normal limits.    All examined muscles (as indicated in the following table) showed no evidence of electrical instability.    Impression: The above electrodiagnostic study is ABNORMAL and reveals evidence of:  a moderate to severe left median nerve entrapment at the wrist (carpal tunnel syndrome) affecting sensory and motor components.   a moderate right median nerve entrapment at the wrist (carpal tunnel syndrome) affecting sensory and motor components.   There is no significant electrodiagnostic evidence of any other focal nerve entrapment, brachial plexopathy or cervical radiculopathy.   Recommendations: 1.  Follow-up with referring physician. 2.  Continue current management of symptoms. 3.  Suggest surgical evaluation.  ___________________________ Laurence Spates FAAPMR Board Certified, American Board of Physical Medicine and Rehabilitation    Nerve Conduction Studies Anti Sensory Summary Table   Stim Site NR Peak (ms) Norm Peak (ms) P-T Amp (V) Norm P-T Amp Site1 Site2 Delta-P (ms) Dist (cm) Vel (m/s) Norm Vel (m/s)  Left Median Acr Palm Anti  Sensory (2nd Digit)  31.2C  Wrist    *6.9 <3.6 *9.6 >10 Wrist Palm 0.2 0.0    Palm    *7.1 <2.0 5.6         Right Median Acr Palm Anti Sensory (2nd Digit)  30.4C  Wrist    *5.1 <3.6 17.0 >10 Wrist Palm  0.0    Palm *NR  <2.0          Left Radial Anti Sensory (Base 1st Digit)  31.3C  Wrist    2.3 <3.1 39.1  Wrist Base 1st Digit 2.3 0.0    Right Radial Anti Sensory (Base 1st Digit)  31C  Wrist    2.3 <3.1 44.2  Wrist Base 1st Digit 2.3 0.0    Left Ulnar Anti Sensory (5th Digit)  31.4C  Wrist    3.3 <3.7 33.3 >15.0 Wrist 5th Digit 3.3 14.0 42 >38  Right Ulnar Anti Sensory (5th Digit)  31.3C  Wrist    3.3 <3.7 29.1 >15.0 Wrist 5th Digit 3.3 14.0 42 >38   Motor Summary Table   Stim Site NR Onset (ms) Norm Onset (ms) O-P Amp (mV) Norm O-P Amp Site1 Site2 Delta-0 (ms) Dist (cm) Vel (m/s) Norm Vel (m/s)  Left Median Motor (Abd Poll Brev)  31.3C  Wrist    *6.6 <4.2 5.5 >5 Elbow Wrist 4.6 20.2 *44 >50  Elbow    11.2  5.3         Right Median Motor (Abd Poll Brev)  31.5C  Wrist    *5.5 <4.2 6.3 >5 Elbow Wrist 4.3 20.5 *48 >50  Elbow    9.8  6.4         Left Ulnar Motor (  Abd Dig Min)  31.4C  Wrist    3.0 <4.2 8.3 >3 B Elbow Wrist 3.1 19.0 61 >53  B Elbow    6.1  8.2  A Elbow B Elbow 1.5 10.0 67 >53  A Elbow    7.6  6.6         Right Ulnar Motor (Abd Dig Min)  32C  Wrist    2.9 <4.2 8.8 >3 B Elbow Wrist 3.1 19.5 63 >53  B Elbow    6.0  8.0  A Elbow B Elbow 1.0 10.0 100 >53  A Elbow    7.0  7.3          EMG   Side Muscle Nerve Root Ins Act Fibs Psw Amp Dur Poly Recrt Int Fraser Din Comment  Left Abd Poll Brev Median C8-T1 Nml Nml Nml Nml Nml 0 Nml Nml   Left 1stDorInt Ulnar C8-T1 Nml Nml Nml Nml Nml 0 Nml Nml   Left PronatorTeres Median C6-7 Nml Nml Nml Nml Nml 0 Nml Nml   Left Biceps Musculocut C5-6 Nml Nml Nml Nml Nml 0 Nml Nml   Left Deltoid Axillary C5-6 Nml Nml Nml Nml Nml 0 Nml Nml     Nerve Conduction Studies Anti Sensory Left/Right Comparison   Stim Site L Lat (ms) R Lat  (ms) L-R Lat (ms) L Amp (V) R Amp (V) L-R Amp (%) Site1 Site2 L Vel (m/s) R Vel (m/s) L-R Vel (m/s)  Median Acr Palm Anti Sensory (2nd Digit)  31.2C  Wrist *6.9 *5.1 1.8 *9.6 17.0 43.5 Wrist Palm     Palm *7.1   5.6         Radial Anti Sensory (Base 1st Digit)  31.3C  Wrist 2.3 2.3 0.0 39.1 44.2 11.5 Wrist Base 1st Digit     Ulnar Anti Sensory (5th Digit)  31.4C  Wrist 3.3 3.3 0.0 33.3 29.1 12.6 Wrist 5th Digit 42 42 0   Motor Left/Right Comparison   Stim Site L Lat (ms) R Lat (ms) L-R Lat (ms) L Amp (mV) R Amp (mV) L-R Amp (%) Site1 Site2 L Vel (m/s) R Vel (m/s) L-R Vel (m/s)  Median Motor (Abd Poll Brev)  31.3C  Wrist *6.6 *5.5 *1.1 5.5 6.3 12.7 Elbow Wrist *44 *48 4  Elbow 11.2 9.8 1.4 5.3 6.4 17.2       Ulnar Motor (Abd Dig Min)  31.4C  Wrist 3.0 2.9 0.1 8.3 8.8 5.7 B Elbow Wrist 61 63 2  B Elbow 6.1 6.0 0.1 8.2 8.0 2.4 A Elbow B Elbow 67 100 *33  A Elbow 7.6 7.0 0.6 6.6 7.3 9.6          Waveforms:

## 2022-02-15 ENCOUNTER — Ambulatory Visit
Admission: RE | Admit: 2022-02-15 | Discharge: 2022-02-15 | Disposition: A | Discharge status: Home / Self Care | Payer: Medicare Other | Attending: Physical Medicine & Rehabilitation | Admitting: Physical Medicine & Rehabilitation

## 2022-02-15 ENCOUNTER — Ambulatory Visit
Admission: RE | Admit: 2022-02-15 | Discharge: 2022-02-15 | Disposition: A | Discharge status: Home / Self Care | Payer: Medicare Other | Source: Ambulatory Visit | Attending: Physical Medicine & Rehabilitation | Admitting: Physical Medicine & Rehabilitation

## 2022-02-15 DIAGNOSIS — M533 Sacrococcygeal disorders, not elsewhere classified: Secondary | ICD-10-CM | POA: Insufficient documentation

## 2022-02-22 ENCOUNTER — Encounter: Payer: Medicare Other | Attending: Physical Medicine & Rehabilitation | Admitting: Physical Medicine & Rehabilitation

## 2022-02-22 ENCOUNTER — Encounter: Payer: Self-pay | Admitting: Physical Medicine & Rehabilitation

## 2022-02-22 VITALS — BP 157/76 | HR 67 | Temp 98.2°F | Ht 64.0 in | Wt 219.0 lb

## 2022-02-22 DIAGNOSIS — M545 Low back pain, unspecified: Secondary | ICD-10-CM | POA: Insufficient documentation

## 2022-02-22 DIAGNOSIS — M7918 Myalgia, other site: Secondary | ICD-10-CM

## 2022-02-22 DIAGNOSIS — M4807 Spinal stenosis, lumbosacral region: Secondary | ICD-10-CM | POA: Diagnosis not present

## 2022-02-22 DIAGNOSIS — M961 Postlaminectomy syndrome, not elsewhere classified: Secondary | ICD-10-CM | POA: Diagnosis not present

## 2022-02-22 MED ORDER — MORPHINE SULFATE ER 15 MG PO TBCR: 15.0000 mg | EXTENDED_RELEASE_TABLET | Freq: Three times a day (TID) | ORAL | 0 refills | Status: DC

## 2022-02-22 MED ORDER — LIDOCAINE HCL 1 % IJ SOLN
5.0000 mL | Freq: Once | INTRAMUSCULAR | Status: AC
  Administered 2022-02-22: 5 mL

## 2022-02-22 MED ORDER — LIDOCAINE HCL (PF) 2 % IJ SOLN
2.0000 mL | Freq: Once | INTRAMUSCULAR | Status: AC
  Administered 2022-02-22: 2 mL

## 2022-02-22 MED ORDER — MORPHINE SULFATE 15 MG PO TABS: 15.0000 mg | ORAL_TABLET | Freq: Two times a day (BID) | ORAL | 0 refills | Status: DC | PRN

## 2022-02-22 MED ORDER — BETAMETHASONE SOD PHOS & ACET 6 (3-3) MG/ML IJ SUSP
6.0000 mg | Freq: Once | INTRAMUSCULAR | Status: AC
  Administered 2022-02-22: 6 mg via INTRAMUSCULAR

## 2022-02-22 NOTE — Progress Notes (Signed)

## 2022-02-22 NOTE — Patient Instructions (Signed)
Sacroiliac injection was performed today. A combination of numbing medicine (lidocaine) plus a cortisone medicine (betamethasone) was injected. The injection was done under x-ray guidance. This procedure has been performed to help reduce low back and buttocks pain as well as potentially hip pain. The duration of this injection is variable lasting from hours to  Months. It may repeated if needed. 

## 2022-02-22 NOTE — Progress Notes (Signed)
  PROCEDURE RECORD [facility]   Name: [name] [name] DOB:Dec 31, 1955 MRN: [id number]  Date:02/22/2022  Physician: [name], MD    Nurse/CMA: Jorja Loa MA  Allergies:  Allergies  Allergen Reactions   Orudis [Ketoprofen] Hives, Itching and Rash    onSet: 07/30/1990    Consent Signed: Yes.    Is patient diabetic? No.  CBG today? N/a  Pregnant: No. LMP: No LMP recorded. Patient has had a hysterectomy. (age [age])  Anticoagulants: no Anti-inflammatory: no Antibiotics: no  Procedure: Sacroiliac Steroid Injection  Position: Prone Start Time: 2:31pm  End Time: 2:39  Fluoro Time: 78  RN/CMA Ethelda Deangelo MA Shikira Folino MA    Time 2:00 pm 2:39    BP 157/76 145/70    Pulse 67 49    Respirations 16 16    O2 Sat 95 96    S/S 6 6    Pain Level 8/10 6/10     D/C home with Sister, patient A & O X 3, D/C instructions reviewed, and sits independently.

## 2022-02-25 ENCOUNTER — Ambulatory Visit (INDEPENDENT_AMBULATORY_CARE_PROVIDER_SITE_OTHER): Payer: Medicare Other | Admitting: Orthopaedic Surgery

## 2022-02-25 ENCOUNTER — Encounter: Payer: Self-pay | Admitting: Orthopaedic Surgery

## 2022-02-25 DIAGNOSIS — G5602 Carpal tunnel syndrome, left upper limb: Secondary | ICD-10-CM | POA: Insufficient documentation

## 2022-02-25 NOTE — Progress Notes (Signed)
Office Visit Note   Patient: JELITZA MANNINEN           Date of Birth: 1955-09-01           MRN: [id number] Visit Date: 02/25/2022              Requested by: Gerrit Heck, MD [address] [place],  [place] [address] PCP: Gerrit Heck, MD   Assessment & Plan: Visit Diagnoses:  1. Carpal tunnel syndrome, left upper limb     Plan: We will set her up for outpatient carpal tunnel release for severe left carpal tunnel syndrome.  She will continue to use her brace pending surgery.  Decision for surgery made procedure discussed risk surgery discussed postoperative care questions elicited and answered.  Follow-Up Instructions: No follow-ups on file.   Orders:  No orders of the defined types were placed in this encounter.  No orders of the defined types were placed in this encounter.     Procedures: No procedures performed   Clinical Data: No additional findings.   Subjective: Chief Complaint  Patient presents with   Right Hand - Follow-up    NCS review   Left Hand - Follow-up    NCS review    HPI follow-up nerve conduction velocities which show moderate to severe left carpal tunnel syndrome and moderate right.  She is wearing splints pain is severe and she wants to proceed with surgical release.  She did have trigger finger right long finger but states postinjection its not catching and is not as painful as it was.  Left hand numbness and pain is worse than right so we will proceed with left carpal tunnel.  We discussed later dressing opposite right hand and and finger if needed.  Review of Systems previous lumbar surgeries for Crohn's disease.  Cervical spondylosis.  Lumbar fusion done by Dr. Regenia Skeeter in [facility].   Objective: Vital Signs: BP (!) 151/72   Ht '5\' 4"'$  (1.626 m)   Wt 219 lb (99.3 kg)   BMI 37.59 kg/m   Physical Exam Constitutional:      Appearance: She is well-developed.  HENT:     Head: Normocephalic.     Right Ear: External ear  normal.     Left Ear: External ear normal. There is no impacted cerumen.  Eyes:     Pupils: Pupils are equal, round, and reactive to light.  Neck:     Thyroid: No thyromegaly.     Trachea: No tracheal deviation.  Cardiovascular:     Rate and Rhythm: Normal rate.  Pulmonary:     Effort: Pulmonary effort is normal.  Abdominal:     Palpations: Abdomen is soft.  Musculoskeletal:     Cervical back: No rigidity.  Skin:    General: Skin is warm and dry.  Neurological:     Mental Status: She is alert and oriented to person, place, and time.  Psychiatric:        Behavior: Behavior normal.     Ortho Exam mild left thenar atrophy positive carpal compression test positive Phalen's test left.  Also positive opposite right.  Good cervical range of motion.  Ulnar nerve at the elbow is normal.  Specialty Comments:  Tanasia Budzinski" [id number] 66 y.o. female   Interpretation Summary  EMG & NCV Findings: Evaluation of the left median motor and the right median motor nerves showed prolonged distal onset latency (L6.6, R5.5 ms) and decreased conduction velocity (Elbow-Wrist, L44, R48 m/s).  The left median (across palm) sensory nerve showed prolonged distal peak latency (Wrist, 6.9 ms), reduced amplitude (9.6 V), and prolonged distal peak latency (Palm, 7.1 ms).  The right median (across palm) sensory nerve showed no response (Palm) and prolonged distal peak latency (5.1 ms).  All remaining nerves (as indicated in the following tables) were within normal limits.  Left vs. Right side comparison data for the median motor nerve indicates abnormal L-R latency difference (1.1 ms).  The ulnar motor nerve indicates abnormal L-R velocity difference (A Elbow-B Elbow, 33 m/s).  All remaining left vs. right side differences were within normal limits.     All examined muscles (as indicated in the following table) showed no evidence of electrical instability.     Impression: The above  electrodiagnostic study is ABNORMAL and reveals evidence of:   a moderate to severe left median nerve entrapment at the wrist (carpal tunnel syndrome) affecting sensory and motor components.    a moderate right median nerve entrapment at the wrist (carpal tunnel syndrome) affecting sensory and motor components.    There is no significant electrodiagnostic evidence of any other focal nerve entrapment, brachial plexopathy or cervical radiculopathy.    Recommendations: 1.  Follow-up with referring physician. 2.  Continue current management of symptoms. 3.  Suggest surgical evaluation.   ___________________________ [name] Board Certified, American Board of Physical Medicine and Rehabilitation      Imaging: No results found.   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   Allergic rhinitis 04/15/2019   Chronic pain 04/15/2019   Memory difficulties 04/15/2019   Pre-diabetes 04/15/2019   Class 2 severe obesity due to excess calories with serious comorbidity and body mass index (BMI) of 36.0 to 36.9 in adult [facility]) 04/04/2017   Degeneration of lumbar intervertebral disc 11/28/2014   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/28/2014   History of lumbar fusion 11/28/2014   Hypokalemia 11/13/2014   Neuropathy 11/13/2014   Primary insomnia 11/13/2014   Postlaminectomy syndrome, cervical region 12/09/2013   Heart palpitations 10/19/2012   Dizziness 04/22/2012   Hyperlipemia 04/09/2012   Cervical pain 01/26/2012   Postlaminectomy syndrome, lumbar  region 08/11/2011   Crohn's disease ([place])    Essential hypertension 09/02/2008   Past Medical History:  Diagnosis Date   Anemia    Blood transfusion without reported diagnosis    Crohn's disease ([place])    Disorder of sacroiliac joint    Exertional dyspnea 05/28/2021   Hyperlipidemia    Hypertension    Ischemic colitis ([place])    Lumbago    Lumbar post-laminectomy syndrome    Lumbosacral neuritis    Lumbosacral radiculitis    OSA (obstructive sleep apnea) 05/28/2021   Sciatica     Family History  Problem Relation Age of Onset   Hyperlipidemia Mother    Heart attack Mother    Heart disease Mother 29       CHF; CAD   Hypertension Mother    Heart failure Mother    Diabetes Sister    Lupus Sister    Kidney failure Sister  Hyperlipidemia Sister    Hypertension Sister    Stroke Maternal Aunt    Hypertension Brother    Colon cancer Neg Hx    Rectal cancer Neg Hx    Breast cancer Neg Hx     Past Surgical History:  Procedure Laterality Date   ABDOMINAL HYSTERECTOMY  1982   DUB; ovaries intact   bladder surg  1992   bladder retraction   BREAST BIOPSY     BREAST REDUCTION SURGERY  1984   CARDIAC CATHETERIZATION  64's   [name]   COLON SURGERY  05/11/11   [facility] of adhesions Crohn's   MASS EXCISION Left 01/03/2018   Procedure: EXCISION LEFT UPPER BACK LIPOMA ERAS PATH;  Surgeon: Erroll Luna, MD;  Location: [facility];  Service: General;  Laterality: Left;   REDUCTION MAMMAPLASTY     SPINE SURGERY     5 total (1 upper, 4 lumbar)   SPINE SURGERY     nerve stimulator on right side   Social History   Occupational History   Occupation: disability    Comment: 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  Tobacco Use   Smoking status: Former    Packs/day: 0.50    Years: 20.00    Total pack years: 10.00    Types: Cigarettes    Quit date: 07/21/2008    Years since quitting: 13.6   Smokeless tobacco: Never  Vaping Use   Vaping Use: Never used  Substance and Sexual  Activity   Alcohol use: No   Drug use: No   Sexual activity: Not on file

## 2022-03-02 ENCOUNTER — Ambulatory Visit: Payer: Medicare Other | Admitting: Orthopaedic Surgery

## 2022-03-07 ENCOUNTER — Telehealth: Payer: Self-pay

## 2022-03-07 DIAGNOSIS — G5602 Carpal tunnel syndrome, left upper limb: Secondary | ICD-10-CM | POA: Diagnosis not present

## 2022-03-07 NOTE — Telephone Encounter (Signed)
Patient called stating she hand carpal tunnel surgery today and the doctor did not give her any pain medicine and she needs something stronger.

## 2022-03-08 NOTE — Telephone Encounter (Signed)
Call placed to [name] [name].  She states she had Carpal Tunnel release surgery on 03/08/2022 by Dr Lorin Mercy. Dr Lorin Mercy note was reviewed.  [name] [name] reports increase intensity of pain , discussed with Dr Read Drivers regarding the above. [name] [name] can take her MSIR tablets 4 times a day as needed for a week.  Today she has 48 tablets, she was instructed to call office the first week in October, she verbalizes understanding.

## 2022-03-14 ENCOUNTER — Telehealth: Payer: Self-pay | Admitting: Physical Medicine & Rehabilitation

## 2022-03-14 ENCOUNTER — Encounter: Payer: Self-pay | Admitting: Physical Medicine & Rehabilitation

## 2022-03-14 NOTE — Telephone Encounter (Signed)
Pt. Called to request provider to type a letter to [employer] so she can be excused from participating due to her medical condition and not being able to sit for long periods of time.   Case#: [id number]  When completed please fax to: [phone]

## 2022-03-15 ENCOUNTER — Ambulatory Visit (INDEPENDENT_AMBULATORY_CARE_PROVIDER_SITE_OTHER): Payer: Medicare Other | Admitting: Orthopaedic Surgery

## 2022-03-15 VITALS — BP 154/63 | HR 53 | Ht 64.0 in | Wt 219.0 lb

## 2022-03-15 DIAGNOSIS — G5602 Carpal tunnel syndrome, left upper limb: Secondary | ICD-10-CM

## 2022-03-15 NOTE — Progress Notes (Signed)
Postop carpal tunnel release incision looks good stockinette applied she will use her wrist brace at home for protection of the incision and return in 1 week for suture removal and Steri-Strip application.

## 2022-03-21 ENCOUNTER — Telehealth: Payer: Self-pay

## 2022-03-21 MED ORDER — MORPHINE SULFATE ER 15 MG PO TBCR: 15.0000 mg | EXTENDED_RELEASE_TABLET | Freq: Three times a day (TID) | ORAL | 0 refills | Status: DC

## 2022-03-21 MED ORDER — MORPHINE SULFATE 15 MG PO TABS: 15.0000 mg | ORAL_TABLET | Freq: Two times a day (BID) | ORAL | 0 refills | Status: DC | PRN

## 2022-03-21 NOTE — Telephone Encounter (Signed)
PMP was Reviewed.  Call placed to Ms Redcay, she was given permission to increase her MSIR to 4 times a day for a week post Carpal Tunnel Surgery on 03/08/2022.  MS Contin and MSIR e-scribed today. Ms. Klumb is aware of the above and verbalizes understanding.

## 2022-03-21 NOTE — Telephone Encounter (Signed)
Patient called and stated she was instructed to call the first week in October because her medication was adjusted due to her carpal tunnel release surgery. She states she has enough pills to last her until 03/30/22. Per PMP, last fill was 03/02/22.

## 2022-03-22 ENCOUNTER — Encounter: Payer: Self-pay | Admitting: Orthopaedic Surgery

## 2022-03-22 ENCOUNTER — Ambulatory Visit (INDEPENDENT_AMBULATORY_CARE_PROVIDER_SITE_OTHER): Payer: Medicare Other | Admitting: Orthopaedic Surgery

## 2022-03-22 VITALS — BP 155/72 | HR 54 | Ht 64.0 in | Wt 219.0 lb

## 2022-03-22 DIAGNOSIS — G5602 Carpal tunnel syndrome, left upper limb: Secondary | ICD-10-CM

## 2022-03-22 NOTE — Progress Notes (Signed)
Post left carpal tunnel release.  Sutures removed incision looks good.  She states she has had a little bit of catching but no true locking of her index finger of her left hand same side as her carpal tunnel release.  She is already had carpal tunnel release on the opposite right hand and had a previous injection right middle finger which was triggering but right middle finger no longer catches.  She will use her wrist splint for a week or 2 to protect the incision and can return if she has recurrent problems with finger triggering.

## 2022-03-22 NOTE — Addendum Note (Signed)
Addended by: Marybelle Killings on: 03/22/2022 09:18 AM   Modules accepted: Orders

## 2022-03-29 ENCOUNTER — Encounter: Payer: Self-pay | Admitting: Registered Nurse

## 2022-03-29 ENCOUNTER — Other Ambulatory Visit: Payer: Self-pay | Admitting: Registered Nurse

## 2022-03-29 ENCOUNTER — Encounter: Payer: Medicare Other | Attending: Physical Medicine & Rehabilitation | Admitting: Registered Nurse

## 2022-03-29 VITALS — BP 127/67 | HR 56 | Ht 64.0 in | Wt 222.8 lb

## 2022-03-29 DIAGNOSIS — G894 Chronic pain syndrome: Secondary | ICD-10-CM | POA: Diagnosis not present

## 2022-03-29 DIAGNOSIS — Z5181 Encounter for therapeutic drug level monitoring: Secondary | ICD-10-CM | POA: Diagnosis not present

## 2022-03-29 DIAGNOSIS — M62838 Other muscle spasm: Secondary | ICD-10-CM

## 2022-03-29 DIAGNOSIS — R001 Bradycardia, unspecified: Secondary | ICD-10-CM

## 2022-03-29 DIAGNOSIS — M961 Postlaminectomy syndrome, not elsewhere classified: Secondary | ICD-10-CM

## 2022-03-29 DIAGNOSIS — Z79891 Long term (current) use of opiate analgesic: Secondary | ICD-10-CM | POA: Diagnosis not present

## 2022-03-29 DIAGNOSIS — M4807 Spinal stenosis, lumbosacral region: Secondary | ICD-10-CM | POA: Diagnosis not present

## 2022-03-29 MED ORDER — MORPHINE SULFATE ER 15 MG PO TBCR: 15.0000 mg | EXTENDED_RELEASE_TABLET | Freq: Three times a day (TID) | ORAL | 0 refills | Status: DC

## 2022-03-29 NOTE — Progress Notes (Signed)
Subjective:    Patient ID: [name] [name], female    DOB: 19-Mar-1956, 66 y.o.   MRN: [id number]  HPI: [name] [name] is a 66 y.o. female who returns for follow up appointment for chronic pain and medication refill. She states her pain is located in her lower back. She rates her pain 7. Her current exercise regime is walking and performing stretching exercises.  Ms. Ahlgren Morphine equivalent is 75.00 MME.   UDS ordered today.   Pain Inventory Average Pain 7 Pain Right Now 7 My pain is constant, dull, and aching  In the last 24 hours, has pain interfered with the following? General activity 7 Relation with others 7 Enjoyment of life 7 What TIME of day is your pain at its worst? morning , daytime, and night Sleep (in general) Poor  Pain is worse with: walking, sitting, inactivity, and standing Pain improves with: rest, heat/ice, and medication Relief from Meds: 2  Family History  Problem Relation Age of Onset   Hyperlipidemia Mother    Heart attack Mother    Heart disease Mother 34       CHF; CAD   Hypertension Mother    Heart failure Mother    Diabetes Sister    Lupus Sister    Kidney failure Sister    Hyperlipidemia Sister    Hypertension Sister    Stroke Maternal Aunt    Hypertension Brother    Colon cancer Neg Hx    Rectal cancer Neg Hx    Breast cancer Neg Hx    Social History   Socioeconomic History   Marital status: Widowed    Spouse name: Not on file   Number of children: 3   Years of education: Not on file   Highest education level: Not on file  Occupational History   Occupation: disability    Comment: 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  Tobacco Use   Smoking status: Former    Packs/day: 0.50    Years: 20.00    Total pack years: 10.00    Types: Cigarettes    Quit date: 07/21/2008    Years since quitting: 13.6   Smokeless tobacco: Never  Vaping Use   Vaping Use: Never used  Substance and Sexual Activity   Alcohol use: No   Drug use: No   Sexual  activity: Not on file  Other Topics Concern   Not on file  Social History Narrative   Marital status: widowed since 2002; not dating which is bad in 2018      Children: 3 children (45, 69, 40); 13 grandchildren; 1 gg      Employment: disability since 2009; retail      Lives: alone in [place] in apartment      Tobacco: quit in 2011      Alcohol: none      Drugs: none      Exercise: minimal in 2018      ADLs: independent with ADLs; drives      Seatbelt: 100%; no texting.   Social Determinants of Health   Financial Resource Strain: Not on file  Food Insecurity: Not on file  Transportation Needs: Not on file  Physical Activity: Not on file  Stress: Not on file  Social Connections: Not on file   Past Surgical History:  Procedure Laterality Date   ABDOMINAL HYSTERECTOMY  1982   DUB; ovaries intact   bladder surg  1992   bladder retraction   BREAST BIOPSY     BREAST  REDUCTION SURGERY  1984   CARDIAC CATHETERIZATION  1990's   [name]   COLON SURGERY  05/11/11   [facility] of adhesions Crohn's   MASS EXCISION Left 01/03/2018   Procedure: EXCISION LEFT UPPER BACK LIPOMA ERAS PATH;  Surgeon: Erroll Luna, MD;  Location: [facility];  Service: General;  Laterality: Left;   REDUCTION MAMMAPLASTY     SPINE SURGERY     5 total (1 upper, 4 lumbar)   SPINE SURGERY     nerve stimulator on right side   Past Surgical History:  Procedure Laterality Date   ABDOMINAL HYSTERECTOMY  1982   DUB; ovaries intact   bladder surg  1992   bladder retraction   BREAST BIOPSY     BREAST REDUCTION SURGERY  1984   CARDIAC CATHETERIZATION  1990's   [name]   COLON SURGERY  05/11/11   [facility] L[facility] of adhesions Crohn's   MASS EXCISION Left 01/03/2018   Procedure: EXCISION LEFT UPPER BACK LIPOMA ERAS PATH;  Surgeon: Erroll Luna, MD;  Location: [facility];  Service: General;  Laterality: Left;   REDUCTION MAMMAPLASTY     SPINE SURGERY     5 total (1 upper,  4 lumbar)   SPINE SURGERY     nerve stimulator on right side   Past Medical History:  Diagnosis Date   Anemia    Blood transfusion without reported diagnosis    Crohn's disease ([place])    Disorder of sacroiliac joint    Exertional dyspnea 05/28/2021   Hyperlipidemia    Hypertension    Ischemic colitis ([place])    Lumbago    Lumbar post-laminectomy syndrome    Lumbosacral neuritis    Lumbosacral radiculitis    OSA (obstructive sleep apnea) 05/28/2021   Sciatica    BP 127/67   Pulse (!) 53   Ht '5\' 4"'$  (1.626 m)   Wt 222 lb 12.8 oz (101.1 kg)   SpO2 93%   BMI 38.24 kg/m   Opioid Risk Score:   Fall Risk Score:  `1  Depression screen PHQ 2/9     02/22/2022    1:48 PM 01/28/2022    8:31 AM 01/25/2022    9:27 AM 12/31/2021   10:56 AM 12/09/2021    9:25 AM 10/18/2021    8:29 AM 09/15/2021    8:56 AM  Depression screen PHQ 2/9  Decreased Interest 0 2 0 0 0 0 0  Down, Depressed, Hopeless 0 0 0 0 0 0 0  PHQ - 2 Score 0 2 0 0 0 0 0  Altered sleeping  1       Tired, decreased energy  3       Change in appetite  3       Feeling bad or failure about yourself   0       Trouble concentrating  1       Moving slowly or fidgety/restless  2       Suicidal thoughts  0       PHQ-9 Score  12          Review of Systems  Musculoskeletal:        Right shoulder pain  All other systems reviewed and are negative.     Objective:   Physical Exam Vitals and nursing note reviewed.  Constitutional:      Appearance: Normal appearance.  Cardiovascular:     Rate and Rhythm: Normal rate and regular rhythm.  Pulses: Normal pulses.     Heart sounds: Normal heart sounds.  Pulmonary:     Effort: Pulmonary effort is normal.     Breath sounds: Normal breath sounds.  Musculoskeletal:     Cervical back: Normal range of motion and neck supple.     Comments: Normal Muscle Bulk and Muscle Testing Reveals:  Upper Extremities: Full ROM and Muscle Strength  on Right 5/5 and Left 3/5 Wearing Left Wrist  Splint Lumbar Paraspinal Tenderness: L-4- L-5  Lower Extremities: Full ROM and Muscle Strength 5/5 Arises from Table slowly Narrow Based Gait     Skin:    General: Skin is warm and dry.  Neurological:     Mental Status: She is alert and oriented to person, place, and time.  Psychiatric:        Mood and Affect: Mood normal.        Behavior: Behavior normal.         Assessment & Plan:  1.Lumbar Postlaminectomy/ Spinal Stenosis Lumbar Region/ Post-Op Pain/Low back pain with radiating symptoms in L3-4 distribution:  continues to be limited by pain. Refilled:  MSIR 15 mg one tablet twice a day as needed for moderate to sever pain #60 and  Morphine Sulfate ER 15 mg Q 8 hours. 03/29/2022. We will continue the opioid monitoring program, this consists of regular clinic visits, examinations, urine drug screen, pill counts as well as use of [place] Controlled Substance Reporting system. A 12 month History has been reviewed on the [place] Controlled Substance Reporting System on 10/18/2020. Encouraged to Continue exercise regime.   2. Lumbar Radiculopathy: No complaints today. Continue Topamax:.Continue to Monitor 03/29/2022 3.Chronic  Right Knee Pain: No complaints today.  Continue with Ice Therapy and  Voltaren Gel. 03/29/2022 4. Muscle Spasm: Continue current treatment: Tizanidine. 03/29/2022  5. Lipoma: S/P Left  Lipoma Excision of upper back  By Dr. Brantley Stage on 01/03/2018. Surgery Following. Continue to Monitor. 03/29/2022.   6. Paresthesia of Skin: Continue Topamax. Continue to monitor. 03/29/2022 7. Adhesive Capsulitis of Right Shoulder:Right Shoulder Tendonitis Pain:  No complaints today. S/P Right Shoulder Cortisone Injection with Dr Letta Pate, with good relief noted. Continue to monitor. Continue HEP as Tolerated. 03/29/2022  8. Polyarthralgia: Continue HEP as Tolerated. Continue to Monitor.03/29/2022  9. Bradycardia: Apical Pulse check:  Ms. Depner states Cardiology is  following. Continue to Monitor. 03/29/2022  10. Sacral Pain: No complaints today. Continue to Monitor. 03/29/2022   F/U in 1 month      `

## 2022-03-31 LAB — TOXASSURE SELECT,+ANTIDEPR,UR

## 2022-04-06 ENCOUNTER — Telehealth: Payer: Self-pay

## 2022-04-06 DIAGNOSIS — I10 Essential (primary) hypertension: Secondary | ICD-10-CM

## 2022-04-06 NOTE — Patient Instructions (Signed)
Visit Information  Thank you for taking time to visit with me today. Please don't hesitate to contact me if I can be of assistance to you.   Following are the goals we discussed today:   Goals Addressed             This Visit's Progress    Hypertension Management       Care Coordination Interventions: Advised patient to schedule annual wellness visit Care Guide referral for Housing resources Discussed plans with patient for ongoing care management follow up and provided patient with direct contact information for care management team Evaluation of current treatment plan related to hypertension self management and patient's adherence to plan as established by provider Provided education to patient re: stroke prevention, s/s of heart attack and stroke Reviewed medications with patient and discussed importance of compliance  Patient mentioned wanting housing on one level        Our next appointment is by telephone on 05/04/22 at 10:30am  Please call the care guide team at [phone] if you need to cancel or reschedule your appointment.   If you are experiencing a Mental Health or [place] or need someone to talk to, please call the Suicide and Crisis Lifeline: 988   The patient verbalized understanding of instructions, educational materials, and care plan provided today and agreed to receive a mailed copy of patient instructions, educational materials, and care plan.   Telephone follow up appointment with care management team member scheduled [id number]  Jone Baseman, RN, MSN [facility] Care Management Coordinator Direct Line [phone]

## 2022-04-06 NOTE — Addendum Note (Signed)
Addended by: Jone Baseman on: 04/06/2022 12:27 PM   Modules accepted: Orders

## 2022-04-06 NOTE — Patient Outreach (Signed)
  Care Coordination   Initial Visit Note   04/06/2022 Name: [name] [name] MRN: [id number] DOB: 01-Aug-1955  [name] [name] is a 66 y.o. year old female who sees Gerrit Heck, MD for primary care. I spoke with  [name] [name] by phone today. Lives with sister. Home 2 stories  What matters to the patients health and wellness today?  Hypertension management.    Goals Addressed             This Visit's Progress    Hypertension Management       Care Coordination Interventions: Advised patient to schedule annual wellness visit Care Guide referral for Housing resources Discussed plans with patient for ongoing care management follow up and provided patient with direct contact information for care management team Evaluation of current treatment plan related to hypertension self management and patient's adherence to plan as established by provider Provided education to patient re: stroke prevention, s/s of heart attack and stroke Reviewed medications with patient and discussed importance of compliance  Patient mentioned wanting housing on one level        SDOH assessments and interventions completed:  Yes     Care Coordination Interventions Activated:  Yes  Care Coordination Interventions:  Yes, provided   Follow up plan: Follow up call scheduled for November    Encounter Outcome:  Pt. Visit Completed   Jone Baseman, RN, MSN [facility] Care Management Coordinator Direct Line [phone]

## 2022-04-06 NOTE — Patient Outreach (Signed)
  Care Coordination   04/06/2022 Name: [name] [name] MRN: [id number] DOB: 09-Sep-1955   Care Coordination Outreach Attempts:  An unsuccessful telephone outreach was attempted today to offer the patient information about available care coordination services as a benefit of their health plan.   Follow Up Plan:  Additional outreach attempts will be made to offer the patient care coordination information and services.   Encounter Outcome:  No Answer  Care Coordination Interventions Activated:  No   Care Coordination Interventions:  No, not indicated    Jone Baseman, RN, MSN [facility] Care Management Care Management Coordinator Direct Line [phone]

## 2022-04-07 ENCOUNTER — Telehealth: Payer: Self-pay | Admitting: *Deleted

## 2022-04-07 NOTE — Chronic Care Management (AMB) (Signed)
  Care Coordination  Outreach Note  04/07/2022 Name: [name] [name] MRN: [id number] DOB: Nov 05, 1955   Care Coordination Outreach Attempts: An unsuccessful telephone outreach was attempted today to offer the patient information about available care coordination services as a benefit of their health plan.   Follow Up Plan:  Additional outreach attempts will be made to offer the patient care coordination information and services.   Encounter Outcome:  No Answer  [place]  Direct Dial: [phone]

## 2022-04-08 NOTE — Chronic Care Management (AMB) (Signed)
  Care Coordination   Note   04/08/2022 Name: VALORI HOLLENKAMP MRN: [id number] DOB: July 16, 1955  [name] is a 66 y.o. year old female who sees Gerrit Heck, MD for primary care. I reached out to [name] by phone today to offer care coordination services.  Ms. Dicker was given information about Care Coordination services today including:   The Care Coordination services include support from the care team which includes your Nurse Coordinator, Clinical Social Worker, or Pharmacist.  The Care Coordination team is here to help remove barriers to the health concerns and goals most important to you. Care Coordination services are voluntary, and the patient may decline or stop services at any time by request to their care team member.   Care Coordination Consent Status: Patient agreed to services and verbal consent obtained.   Follow up plan:  Telephone appointment with care coordination team member scheduled for:  04/11/22  Encounter Outcome:  Pt. Scheduled  [place]  Direct Dial: [phone]'

## 2022-04-08 NOTE — Chronic Care Management (AMB) (Signed)
  Care Coordination  Outreach Note  04/08/2022 Name: [name] [name] MRN: [id number] DOB: [birth year]   Care Coordination Outreach Attempts: A second unsuccessful outreach was attempted today to offer the patient with information about available care coordination services as a benefit of their health plan.     Follow Up Plan:  Additional outreach attempts will be made to offer the patient care coordination information and services.   Encounter Outcome:  No Answer  [place]  Direct Dial: [phone]

## 2022-04-11 ENCOUNTER — Ambulatory Visit: Payer: Self-pay | Admitting: Licensed Clinical Social Worker

## 2022-04-11 ENCOUNTER — Telehealth: Payer: Self-pay | Admitting: *Deleted

## 2022-04-11 NOTE — Patient Instructions (Signed)
Visit Information  Thank you for taking time to visit with me today. Please don't hesitate to contact me if I can be of assistance to you.   Following are the goals we discussed today:   Goals Addressed               This Visit's Progress     Care Coordination Activities- Housing (pt-stated)        Patient current resident is negatively impacting her ability to complete her ADL's. Her resident has steps and hills. Constant climbing of steps is causing additional pain to back. Patient is looking for a one floor apartment. At a senior living in [place].   SW gave patient information of [facility]. Patient has an appointment on Friday at [place]. Patient has a significant support system who will accommodate her to appointment.   Patient also stated she may consider returning to an older apartment she previously resided at " [place]" in [place].   SW educated patient on staying close to support system and medical providers. Patient agreed.   SW advised she would follow up with patient within 14 days.   SDOH completed. No further assistance needed.          Patient verbalizes understanding of instructions and care plan provided today and agrees to view in [place]. Active MyChart status and patient understanding of how to access instructions and care plan via MyChart confirmed with patient.       Milus Height, Arita Miss, MSW, [place]  Social Worker [facility] Care Management  [phone]

## 2022-04-11 NOTE — Patient Outreach (Signed)
  Care Coordination   Initial Visit Note   04/11/2022 Name: [name] [name] MRN: [id number] DOB: August 05, 1955  [name] [name] is a 66 y.o. year old female who sees Gerrit Heck, MD for primary care. I spoke with  [name] [name] by phone today.  What matters to the patients health and wellness today?  Housing     Goals Addressed               This [name] (pt-stated)        Patient current resident is negatively impacting her ability to complete her ADL's. Her resident has steps and hills. Constant climbing of steps is causing additional pain to back. Patient is looking for a one floor apartment. At a senior living in [place].   SW gave patient information of [facility]. Patient has an appointment on Friday at [place]. Patient has a significant support system who will accommodate her to appointment.   Patient also stated she may consider returning to an older apartment she previously resided at " [name]" in [place].   SW educated patient on staying close to support system and medical providers. Patient agreed.   SW advised she would follow up with patient within 14 days.   SDOH completed. No further assistance needed.         SDOH assessments and interventions completed:  Yes     Care Coordination Interventions Activated:  Yes  Care Coordination Interventions:  Yes, provided   Follow up plan: Follow up call scheduled for within the next 14 days.    Encounter Outcome:  Pt. Visit Completed

## 2022-04-11 NOTE — Telephone Encounter (Signed)
Urine drug screen for this encounter is consistent for prescribed medication 

## 2022-04-14 ENCOUNTER — Encounter (HOSPITAL_BASED_OUTPATIENT_CLINIC_OR_DEPARTMENT_OTHER): Payer: Self-pay | Admitting: Cardiovascular Disease

## 2022-04-14 ENCOUNTER — Ambulatory Visit (INDEPENDENT_AMBULATORY_CARE_PROVIDER_SITE_OTHER): Payer: Medicare Other | Admitting: Cardiovascular Disease

## 2022-04-14 DIAGNOSIS — R0609 Other forms of dyspnea: Secondary | ICD-10-CM | POA: Diagnosis not present

## 2022-04-14 DIAGNOSIS — R002 Palpitations: Secondary | ICD-10-CM | POA: Diagnosis not present

## 2022-04-14 DIAGNOSIS — I10 Essential (primary) hypertension: Secondary | ICD-10-CM

## 2022-04-14 DIAGNOSIS — R0789 Other chest pain: Secondary | ICD-10-CM

## 2022-04-14 NOTE — Assessment & Plan Note (Signed)
She has symptoms of chest pain both at rest and stress.  We will get a coronary CT-A.

## 2022-04-14 NOTE — Assessment & Plan Note (Signed)
Check fasting lipids and a CMP today.  LDL goal is less than 70.

## 2022-04-14 NOTE — Assessment & Plan Note (Signed)
History of palpitations and SVT.  She continues to have daily symptoms though they are short-lived.  Symptoms were worse when she is not on a beta-blocker.  She does have bradycardia but no lightheadedness, dizziness, or presyncope.  Continue current management.

## 2022-04-14 NOTE — Assessment & Plan Note (Signed)
Blood pressure is well-controlled on chlorthalidone and metoprolol.

## 2022-04-14 NOTE — Progress Notes (Signed)
Cardiology Office Note:    Date:  04/14/2022   ID:  [name] [name], DOB 04-Jan-1956, MRN [id number]  PCP:  Gerrit Heck, MD   [facility] Providers Cardiologist:  Skeet Latch, MD     Referring MD: Gerrit Heck, MD   No chief complaint on file.   History of Present Illness:    [name] [name] is a 66 y.o. female with a hx of anemia, Crohn's disease, hyperlipidemia, hypertension, and sciatica here for follow-up. She presented 01/01/2021 for the initial evaluation of heart palpitations. She saw her PCP on 12/24/2020 and it was noted her heart rate was in the 50s and 60s  She reported getting dizzy after getting out of the shower. They did an EKG which showed sinus bradycardia, and inferolateral T wave inversions. An Echo was ordered but has not been performed. Previous Echo 10/2019 revealed LVEF 55-60% with grade 1 diastolic dysfunction and moderate LVEH. She had a nuclear stress test 05/2016 that was negative for ischemia. Ln 02/2021 she had a spinal cord stimulator implanted.    She reported palpitations and wore a 3-day Zio which showed an average heart rate of 67 bpm with a minimum of 42 bpm. She had five beats of SVT and occasional PVCs. She followed up with Laurann Montana, NP on 01/2021.  It was noted that her lowest heart rates occurred while sleeping, and she was referred for a sleep study. She had an Echo 12/2020 with LVEF 55-60%, mild to moderate LVH and grade 1 diastolic dysfunction. She had a sleep study 03/2021. She did have some hypoxic events, but woke up at 1 AM and was unable to fall back asleep.  [name] [name] was started on metoprolol for palpitations.  She was referred for a nuclear stress test 05/2021 that was low risk.  She had some intermittent vertigo that was treated with Epley maneuvers.  She complains of exhaustion and shortness of breath during daily activities, such as putting on clothes and climbing stairs. She reports needing to lean on the rails due to  fatigue and has been advised by her pain clinic physician assistant to monitor her heart rate, which has been as low as 51 bpm.  She expresses concern about the nodules in her lungs, which were initially discovered last year. At that time, there were two nodules, believed to be benign. This year, a follow-up scan revealed three nodules, with one appearing to have grown slightly. A subsequent scan in three months showed the nodules remained, still appearing benign.  The patient experiences chest pain, described as a strain or ache, with varying intensity but not severe. The pain occurs randomly, during both rest and activity.  She also reports swelling in her legs and feet, which worsened in the past week. She denies any changes in salt intake or increased time spent on her feet. Due to back pain, she is unable to sit for extended periods or engage in regular exercise, leading to weight gain. She is considering water aerobics or using a recumbent bike to increase activity levels.  She has a history of fast heart rates and palpitations, with up to five beats of SVT observed on a monitor. She is currently on a low dose of metoprolol to manage her heart rate.   Past Medical History:  Diagnosis Date   Anemia    Blood transfusion without reported diagnosis    Crohn's disease ([place])    Disorder of sacroiliac joint    Exertional dyspnea 05/28/2021   Hyperlipidemia  Hypertension    Ischemic colitis ([place])    Lumbago    Lumbar post-laminectomy syndrome    Lumbosacral neuritis    Lumbosacral radiculitis    OSA (obstructive sleep apnea) 05/28/2021   Sciatica     Past Surgical History:  Procedure Laterality Date   ABDOMINAL HYSTERECTOMY  1982   DUB; ovaries intact   bladder surg  1992   bladder retraction   BREAST BIOPSY     BREAST REDUCTION SURGERY  1984   CARDIAC CATHETERIZATION  1990's   [name]   COLON SURGERY  05/11/11   [facility] of adhesions Crohn's   MASS EXCISION Left 01/03/2018    Procedure: EXCISION LEFT UPPER BACK LIPOMA ERAS PATH;  Surgeon: Erroll Luna, MD;  Location: [place];  Service: General;  Laterality: Left;   REDUCTION MAMMAPLASTY     SPINE SURGERY     5 total (1 upper, 4 lumbar)   SPINE SURGERY     nerve stimulator on right side    Current Medications: Current Meds  Medication Sig   atorvastatin (LIPITOR) 20 MG tablet Take 20 mg by mouth daily.   chlorthalidone (HYGROTON) 25 MG tablet TAKE 1 TABLET (25 MG TOTAL) BY MOUTH DAILY.   diclofenac Sodium (VOLTAREN) 1 % GEL APPLY 1 APPLICATION TOPICALLY 4 (FOUR) TIMES DAILY AS NEEDED.   estrogen, conjugated,-medroxyprogesterone (PREMPRO) 0.3-1.5 MG tablet Take 1 tablet by mouth daily.   fluticasone (FLONASE) 50 MCG/ACT nasal spray SPRAY 2 SPRAYS INTO EACH NOSTRIL EVERY DAY   metoprolol succinate (TOPROL XL) 25 MG 24 hr tablet TAKE 1/2 TABLET BY MOUTH DAILY   morphine (MS CONTIN) 15 MG 12 hr tablet Take 1 tablet (15 mg total) by mouth every 8 (eight) hours.   morphine (MSIR) 15 MG tablet Take 1 tablet (15 mg total) by mouth 2 (two) times daily as needed for moderate pain. [name] [name] had Carpal Tunnel Surgery, she was instructed to increase her MSIR to 4 times a day for a week. Please fill her prescription on 03/29/2022   naloxone (NARCAN) nasal spray 4 mg/0.1 mL Lethargic or respiratory depression after opioid use   potassium chloride (KLOR-CON) 20 MEQ packet USE 1 PACKET AS INSTRUCTED ONCE A DAY   tiZANidine (ZANAFLEX) 2 MG tablet TAKE 1 TABLET (2 MG TOTAL) BY MOUTH 2 (TWO) TIMES DAILY AS NEEDED FOR MUSCLE SPASMS.     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  Tobacco Use   Smoking status: Former    Packs/day: 0.50     Years: 20.00    Total pack years: 10.00    Types: Cigarettes    Quit date: 07/21/2008    Years since quitting: 13.7   Smokeless tobacco: Never  Vaping Use   Vaping Use: Never used  Substance and Sexual Activity   Alcohol use: No   Drug use: No   Sexual activity: Not on file  Other Topics Concern   Not on file  Social History Narrative   Marital status: widowed since 2002; not dating which is bad in 2018      Children: 3 children (45, 36, 40); 13 grandchildren; 1 gg  Employment: disability since 2009; retail      Lives: alone in [place] in apartment      Tobacco: quit in 2011      Alcohol: none      Drugs: none      Exercise: minimal in 2018      ADLs: independent with ADLs; drives      Seatbelt: 100%; no texting.   Social Determinants of Health   Financial Resource Strain: Not on file  Food Insecurity: No Food Insecurity (04/11/2022)   Hunger Vital Sign    Worried About Running Out of Food in the Last Year: Never true    Ran Out of Food in the Last Year: Never true  Transportation Needs: No Transportation Needs (04/11/2022)   PRAPARE - Hydrologist (Medical): No    Lack of Transportation (Non-Medical): No  Physical Activity: Not on file  Stress: Not on file  Social Connections: Not on file     Family History: The patient's family history includes Diabetes in her sister; Heart attack in her mother; Heart disease (age of onset: 57) in her mother; Heart failure in her mother; Hyperlipidemia in her mother and sister; Hypertension in her brother, mother, and sister; Kidney failure in her sister; Lupus in her sister; Stroke in her maternal aunt. There is no history of Colon cancer, Rectal cancer, or Breast cancer.  ROS:   Please see the history of present illness.  (+) Dizziness (+) Back pain (+) Palpitations   All other systems reviewed and are negative.  EKGs/Labs/Other Studies Reviewed:    The following studies were reviewed  today: Lexiscan 06/16/21   The study is normal. The study is low risk.   No ST deviation was noted.   LV perfusion is normal. There is no evidence of ischemia. There is no evidence of infarction.   Left ventricular function is normal. Nuclear stress EF: 67 %. The left ventricular ejection fraction is hyperdynamic (>65%). End diastolic cavity size is normal.   Prior study available for comparison from 05/25/2016.   Normal study without evidence of ischemia or infarction.  Normal LVEF, >65%.  This is a low-risk study.   Echo 01/13/2021:  1. Left ventricular ejection fraction, by estimation, is 55 to 60%. The  left ventricle has normal function. The left ventricle has no regional  wall motion abnormalities. There is mild-to-moderate concentric left  ventricular hypertrophy. Left ventricular   diastolic parameters are consistent with Grade I diastolic dysfunction  (impaired relaxation).   2. Right ventricular systolic function is normal. The right ventricular  size is normal.   3. The mitral valve is normal in structure. Trivial mitral valve  regurgitation. No evidence of mitral stenosis.   4. The aortic valve was not well visualized. Aortic valve regurgitation  is not visualized. No aortic stenosis is present.   5. The inferior vena cava is normal in size with greater than 50%  respiratory variability, suggesting right atrial pressure of 3 mmHg.   Comparison(s): Compared to prior echo on 11/11/19, there is no significant change.   Bilateral Carotid Duplex 01/13/2021: Summary:  Right Carotid: The extracranial vessels were near-normal with only minimal wall thickening or plaque.   Left Carotid: The extracranial vessels were near-normal with only minimal  wall thickening or plaque.   Vertebrals:  Bilateral vertebral arteries demonstrate antegrade flow.  Subclavians: Normal flow hemodynamics were seen in bilateral subclavian arteries.   Monitor 12/2020: 3 Day Zio Monitor   Quality:  Fair.  Baseline artifact. Predominant rhythm: sinus rhythm Average heart rate: 67 bpm Max heart rate: 145 bpm Min heart rate: 42 bpm Pauses >2.5 seconds: none 5 beat episode of SVT.  Heart rate 106 bpm. Occasional PVCs and ventricular bigeminy/trigeminy (<1%)  CT Chest 11/03/2020: FINDINGS: Cardiovascular: Normal heart size. Aortic atherosclerosis. Coronary artery calcification.   Mediastinum/Nodes: No enlarged mediastinal, hilar, or axillary lymph nodes. Thyroid gland, trachea, and esophagus demonstrate no significant findings.   Lungs/Pleura: Mild centrilobular emphysema with diffuse bronchial wall thickening. No pleural effusion, airspace consolidation or atelectasis. There are 2 non-solid nodules identified. The largest is in the left apex with a mean derived diameter of 14.6 mm.   Upper Abdomen: No acute abnormality.   Musculoskeletal: No chest wall mass or suspicious bone lesions identified. Dorsal column stimulator noted within the thoracic canal.   IMPRESSION: 1. Lung-RADS 2, benign appearance or behavior. Continue annual screening with low-dose chest CT without contrast in 12 months. 2. Coronary artery calcifications.   Aortic Atherosclerosis (ICD10-I70.0) and Emphysema (ICD10-J43.9).  Echo 11/11/2019:  1. Left ventricular ejection fraction, by estimation, is 55 to 60%. Left  ventricular ejection fraction by 3D volume is 56 %. The left ventricle has  normal function. The left ventricle has no regional wall motion  abnormalities. There is moderate left  ventricular hypertrophy. Left ventricular diastolic parameters are  consistent with Grade I diastolic dysfunction (impaired relaxation). The  average left ventricular global longitudinal strain is -28.3 %. The global  longitudinal strain is normal.   2. Right ventricular systolic function is normal. The right ventricular  size is normal.   3. The mitral valve is degenerative. Trivial mitral valve regurgitation.   No evidence of mitral stenosis.   4. The aortic valve is tricuspid. Aortic valve regurgitation is not  visualized. No aortic stenosis is present.   5. The inferior vena cava is normal in size with greater than 50%  respiratory variability, suggesting right atrial pressure of 3 mmHg.   Comparison(s): No significant change from prior study.   EKG:  EKG was ordered today: 04/14/22: Sinus bradycardia.  Sinus arrhythmia.  Rate 53 bpm.  Inferolateral T wave inversion 01/01/2021: Sinus bradycardia, Rate 52 bpm. Nonspecific T wave abnormalities.  Recent Labs: 11/09/2021: BUN 11; Creatinine, Ser 0.94; Potassium 3.4; Sodium 142   Recent Lipid Panel    Component Value Date/Time   CHOL 225 (H) 11/09/2021 1024   TRIG 168 (H) 11/09/2021 1024   HDL 45 11/09/2021 1024   CHOLHDL 5.0 (H) 11/09/2021 1024   CHOLHDL 4.9 03/09/2016 1319   VLDL 51 (H) 03/09/2016 1319   LDLCALC 150 (H) 11/09/2021 1024   LDLDIRECT 107 (H) 10/31/2019 1013   STOP-Bang Score:          Physical Exam:    VS:  BP 126/64 (BP Location: Left Arm, Patient Position: Sitting, Cuff Size: Large)   Pulse (!) 53   Ht '5\' 4"'$  (1.626 m)   Wt 223 lb 11.2 oz (101.5 kg)   BMI 38.40 kg/m  , BMI Body mass index is 38.4 kg/m. GENERAL:  Well appearing HEENT: Pupils equal round and reactive, fundi not visualized, oral mucosa unremarkable NECK:  No jugular venous distention, waveform within normal limits, carotid upstroke brisk and symmetric, no bruits, no thyromegaly LUNGS:  Clear to auscultation bilaterally HEART:  RRR.  PMI not displaced or sustained,S1 and S2 within normal limits, no S3, no S4, no clicks, no rubs, no murmurs ABD:  Flat, positive bowel sounds normal in frequency  in pitch, no bruits, no rebound, no guarding, no midline pulsatile mass, no hepatomegaly, no splenomegaly EXT:  2 plus pulses throughout, no edema, no cyanosis no clubbing SKIN:  No rashes no nodules NEURO:  Cranial nerves II through XII grossly intact, motor  grossly intact throughout PSYCH:  Cognitively intact, oriented to person place and time    ASSESSMENT:    1. Essential hypertension   2. Atypical chest pain   3. Heart palpitations   4. Exertional dyspnea     PLAN:    Essential hypertension Blood pressure is well-controlled on chlorthalidone and metoprolol.    Atypical chest pain She has symptoms of chest pain both at rest and stress.  We will get a coronary CT-A.    Heart palpitations History of palpitations and SVT.  She continues to have daily symptoms though they are short-lived.  Symptoms were worse when she is not on a beta-blocker.  She does have bradycardia but no lightheadedness, dizziness, or presyncope.  Continue current management.  Exertional dyspnea She is euvolemic on exam.  Echo and stress testing have been unremarkable in the past.  We will get a coronary CTA.  Hyperlipemia Check fasting lipids and a CMP today.  LDL goal is less than 70.      Disposition: FU with Carnell Beavers C. Oval Linsey, MD, [facility] in 6 months  Medication Adjustments/Labs and Tests Ordered: Current medicines are reviewed at length with the patient today.  Concerns regarding medicines are outlined above.   Orders Placed This Encounter  Procedures   CT CORONARY MORPH W/CTA COR W/SCORE W/CA W/CM &/OR WO/CM   Lipid panel   Comprehensive metabolic panel   Amb Referral To Provider Referral Exercise Program (P.R.E.P)   EKG 12-Lead    No orders of the defined types were placed in this encounter.   Patient Instructions  Medication Instructions:  Your physician recommends that you continue on your current medications as directed. Please refer to the Current Medication list given to you today.   *If you need a refill on your cardiac medications before your next appointment, please call your pharmacy*  Lab Work: LP/CMET TODAY   If you have labs (blood work) drawn today and your tests are completely normal, you will receive your results only  by: [place] (if you have MyChart) OR A paper copy in the mail If you have any lab test that is abnormal or we need to change your treatment, we will call you to review the results.  Testing/Procedures: Your physician has requested that you have cardiac CT. Cardiac computed tomography (CT) is a painless test that uses an x-ray machine to take clear, detailed pictures of your heart. For further information please visit [email or web address]. Please follow instruction sheet as given.  Follow-Up: At [facility], you and your health needs are our priority.  As part of our continuing mission to provide you with exceptional heart care, we have created designated Provider Care Teams.  These Care Teams include your primary Cardiologist (physician) and Advanced Practice Providers (APPs -  Physician Assistants and Nurse Practitioners) who all work together to provide you with the care you need, when you need it.  We recommend signing up for the patient portal called "MyChart".  Sign up information is provided on this After Visit Summary.  MyChart is used to connect with patients for Virtual Visits (Telemedicine).  Patients are able to view lab/test results, encounter notes, upcoming appointments, etc.  Non-urgent messages can be sent to your provider as  well.   To learn more about what you can do with MyChart, go to [email or web address].    Your next appointment:   6 month(s)  The format for your next appointment:   In Person  Provider:   Skeet Latch, MD    Other Instructions   Your cardiac CT will be scheduled at one of the below locations:   [facility] [address] [place], [place] [address] [phone]  [place] [address] [place], [place] [address] [phone]  [facility] [place], [place] [address] [phone]  If scheduled at [facility], please arrive at the [facility] and Children's Entrance (Entrance C2) of [facility] 30 minutes prior to test start time. You can use the FREE valet parking offered at entrance C (encouraged to control the heart rate for the test)  Proceed to the [facility] Radiology Department (first floor) to check-in and test prep.  All radiology patients and guests should use entrance C2 at [facility], accessed from [facility], even though the hospital's physical address listed is [address].    If scheduled at [facility] or [facility], please arrive 15 mins early for check-in and test prep.  Please follow these instructions carefully (unless otherwise directed):  On the Night Before the Test: Be sure to Drink plenty of water. Do not consume any caffeinated/decaffeinated beverages or chocolate 12 hours prior to your test. Do not take any antihistamines 12 hours prior to your test.  On the Day of the Test: Drink plenty of water until 1 hour prior to the test. Do not eat any food 1 hour prior to test. You may take your regular medications prior to the test.  Take metoprolol (Lopressor) two hours prior to test. HOLD Furosemide/Hydrochlorothiazide morning of the test. FEMALES- please wear underwire-free bra if available, avoid dresses & tight clothing  After the Test: Drink plenty of water. After receiving IV contrast, you may experience a mild flushed feeling. This is normal. On occasion, you may experience a mild rash up to 24 hours after the test. This is not dangerous. If this occurs, you can take Benadryl 25 mg and increase your fluid intake. If you experience trouble breathing, this can be serious. If it is severe call 911 IMMEDIATELY. If it is mild, please call our office. If you take any of these medications: Glipizide/Metformin, Avandament, Glucavance, please do not take 48 hours after  completing test unless otherwise instructed.  We will call to schedule your test 2-4 weeks out understanding that some insurance companies will need an authorization prior to the service being performed.   For non-scheduling related questions, please contact the cardiac imaging nurse navigator should you have any questions/concerns: Marchia Bond, Cardiac Imaging Nurse Navigator Gordy Clement, Cardiac Imaging Nurse Navigator [facility] Direct Office Dial: [phone]   For scheduling needs, including cancellations and rescheduling, please call [place], [phone].  Cardiac CT Angiogram A cardiac CT angiogram is a procedure to look at the heart and the area around the heart. It may be done to help find the cause of chest pains or other symptoms of heart disease. During this procedure, a substance called contrast dye is injected into the blood vessels in the area to be checked. A large X-ray machine, called a CT scanner, then takes detailed pictures of the  heart and the surrounding area. The procedure is also sometimes called a coronary CT angiogram, coronary artery scanning, or CTA. A cardiac CT angiogram allows the health care provider to see how well blood is flowing to and from the heart. The health care provider will be able to see if there are any problems, such as: Blockage or narrowing of the coronary arteries in the heart. Fluid around the heart. Signs of weakness or disease in the muscles, valves, and tissues of the heart. Tell a health care provider about: Any allergies you have. This is especially important if you have had a previous allergic reaction to contrast dye. All medicines you are taking, including vitamins, herbs, eye drops, creams, and over-the-counter medicines. Any blood disorders you have. Any surgeries you have had. Any medical conditions you have. Whether you are pregnant or may be pregnant. Any anxiety disorders, chronic pain, or  other conditions you have that may increase your stress or prevent you from lying still. What are the risks? Generally, this is a safe procedure. However, problems may occur, including: Bleeding. Infection. Allergic reactions to medicines or dyes. Damage to other structures or organs. Kidney damage from the contrast dye that is used. Increased risk of cancer from radiation exposure. This risk is low. Talk with your health care provider about: The risks and benefits of testing. How you can receive the lowest dose of radiation. What happens before the procedure? Wear comfortable clothing and remove any jewelry, glasses, dentures, and hearing aids. Follow instructions from your health care provider about eating and drinking. This may include: For 12 hours before the procedure -- avoid caffeine. This includes tea, coffee, soda, energy drinks, and diet pills. Drink plenty of water or other fluids that do not have caffeine in them. Being well hydrated can prevent complications. For 4-6 hours before the procedure -- stop eating and drinking. The contrast dye can cause nausea, but this is less likely if your stomach is empty. Ask your health care provider about changing or stopping your regular medicines. This is especially important if you are taking diabetes medicines, blood thinners, or medicines to treat problems with erections (erectile dysfunction). What happens during the procedure?  Hair on your chest may need to be removed so that small sticky patches called electrodes can be placed on your chest. These will transmit information that helps to monitor your heart during the procedure. An IV will be inserted into one of your veins. You might be given a medicine to control your heart rate during the procedure. This will help to ensure that good images are obtained. You will be asked to lie on an exam table. This table will slide in and out of the CT machine during the procedure. Contrast dye will  be injected into the IV. You might feel warm, or you may get a metallic taste in your mouth. You will be given a medicine called nitroglycerin. This will relax or dilate the arteries in your heart. The table that you are lying on will move into the CT machine tunnel for the scan. The person running the machine will give you instructions while the scans are being done. You may be asked to: Keep your arms above your head. Hold your breath. Stay very still, even if the table is moving. When the scanning is complete, you will be moved out of the machine. The IV will be removed. The procedure may vary among health care providers and hospitals. What can I expect after the procedure?  After your procedure, it is common to have: A metallic taste in your mouth from the contrast dye. A feeling of warmth. A headache from the nitroglycerin. Follow these instructions at home: Take over-the-counter and prescription medicines only as told by your health care provider. If you are told, drink enough fluid to keep your urine pale yellow. This will help to flush the contrast dye out of your body. Most people can return to their normal activities right after the procedure. Ask your health care provider what activities are safe for you. It is up to you to get the results of your procedure. Ask your health care provider, or the department that is doing the procedure, when your results will be ready. Keep all follow-up visits as told by your health care provider. This is important. Contact a health care provider if: You have any symptoms of allergy to the contrast dye. These include: Shortness of breath. Rash or hives. A racing heartbeat. Summary A cardiac CT angiogram is a procedure to look at the heart and the area around the heart. It may be done to help find the cause of chest pains or other symptoms of heart disease. During this procedure, a large X-ray machine, called a CT scanner, takes detailed pictures  of the heart and the surrounding area after a contrast dye has been injected into blood vessels in the area. Ask your health care provider about changing or stopping your regular medicines before the procedure. This is especially important if you are taking diabetes medicines, blood thinners, or medicines to treat erectile dysfunction. If you are told, drink enough fluid to keep your urine pale yellow. This will help to flush the contrast dye out of your body. This information is not intended to replace advice given to you by your health care provider. Make sure you discuss any questions you have with your health care provider. Document Revised: 09/23/2021 Document Reviewed: 01/30/2019 Elsevier Patient Education  [place], Skeet Latch, MD  04/14/2022 9:13 AM    [place]

## 2022-04-14 NOTE — Assessment & Plan Note (Signed)
She is euvolemic on exam.  Echo and stress testing have been unremarkable in the past.  We will get a coronary CTA.

## 2022-04-14 NOTE — Patient Instructions (Signed)
Medication Instructions:  Your physician recommends that you continue on your current medications as directed. Please refer to the Current Medication list given to you today.   *If you need a refill on your cardiac medications before your next appointment, please call your pharmacy*  Lab Work: LP/CMET TODAY   If you have labs (blood work) drawn today and your tests are completely normal, you will receive your results only by: [place] (if you have MyChart) OR A paper copy in the mail If you have any lab test that is abnormal or we need to change your treatment, we will call you to review the results.  Testing/Procedures: Your physician has requested that you have cardiac CT. Cardiac computed tomography (CT) is a painless test that uses an x-ray machine to take clear, detailed pictures of your heart. For further information please visit [email or web address]. Please follow instruction sheet as given.  Follow-Up: At [facility], you and your health needs are our priority.  As part of our continuing mission to provide you with exceptional heart care, we have created designated Provider Care Teams.  These Care Teams include your primary Cardiologist (physician) and Advanced Practice Providers (APPs -  Physician Assistants and Nurse Practitioners) who all work together to provide you with the care you need, when you need it.  We recommend signing up for the patient portal called "MyChart".  Sign up information is provided on this After Visit Summary.  MyChart is used to connect with patients for Virtual Visits (Telemedicine).  Patients are able to view lab/test results, encounter notes, upcoming appointments, etc.  Non-urgent messages can be sent to your provider as well.   To learn more about what you can do with MyChart, go to [email or web address].    Your next appointment:   6 month(s)  The format for your next appointment:   In Person  Provider:   Skeet Latch, MD     Other Instructions   Your cardiac CT will be scheduled at one of the below locations:   [facility] [address] [place], [place] [address] [phone]  [place] [address] [place], [place] [address] [phone]  [facility] [place], [place] [address] [phone]  If scheduled at [facility], please arrive at the [facility] and Children's Entrance (Entrance C2) of [facility] 30 minutes prior to test start time. You can use the FREE valet parking offered at entrance C (encouraged to control the heart rate for the test)  Proceed to the [facility] Radiology Department (first floor) to check-in and test prep.  All radiology patients and guests should use entrance C2 at [facility], accessed from [facility], even though the hospital's physical address listed is [address].    If scheduled at [facility] or [facility], please arrive 15 mins early for check-in and test prep.  Please follow these instructions carefully (unless otherwise directed):  On the Night Before the Test: Be sure to Drink plenty of water. Do not consume any caffeinated/decaffeinated beverages or chocolate 12 hours prior to your test. Do not take any antihistamines 12 hours prior to your test.  On the Day of the Test: Drink plenty of water until 1 hour prior to the test. Do not eat any food 1 hour prior to test. You may take your regular medications prior to the  test.  Take metoprolol (Lopressor) two hours prior to test. HOLD Furosemide/Hydrochlorothiazide morning of the test. FEMALES- please wear underwire-free bra if available, avoid dresses & tight clothing  After the Test: Drink plenty of water. After receiving IV contrast, you may experience a mild flushed feeling. This is  normal. On occasion, you may experience a mild rash up to 24 hours after the test. This is not dangerous. If this occurs, you can take Benadryl 25 mg and increase your fluid intake. If you experience trouble breathing, this can be serious. If it is severe call 911 IMMEDIATELY. If it is mild, please call our office. If you take any of these medications: Glipizide/Metformin, Avandament, Glucavance, please do not take 48 hours after completing test unless otherwise instructed.  We will call to schedule your test 2-4 weeks out understanding that some insurance companies will need an authorization prior to the service being performed.   For non-scheduling related questions, please contact the cardiac imaging nurse navigator should you have any questions/concerns: Marchia Bond, Cardiac Imaging Nurse Navigator Gordy Clement, Cardiac Imaging Nurse Navigator [facility] Direct Office Dial: [phone]   For scheduling needs, including cancellations and rescheduling, please call [place], [phone].  Cardiac CT Angiogram A cardiac CT angiogram is a procedure to look at the heart and the area around the heart. It may be done to help find the cause of chest pains or other symptoms of heart disease. During this procedure, a substance called contrast dye is injected into the blood vessels in the area to be checked. A large X-ray machine, called a CT scanner, then takes detailed pictures of the heart and the surrounding area. The procedure is also sometimes called a coronary CT angiogram, coronary artery scanning, or CTA. A cardiac CT angiogram allows the health care provider to see how well blood is flowing to and from the heart. The health care provider will be able to see if there are any problems, such as: Blockage or narrowing of the coronary arteries in the heart. Fluid around the heart. Signs of weakness or disease in the muscles, valves, and tissues of the heart. Tell a  health care provider about: Any allergies you have. This is especially important if you have had a previous allergic reaction to contrast dye. All medicines you are taking, including vitamins, herbs, eye drops, creams, and over-the-counter medicines. Any blood disorders you have. Any surgeries you have had. Any medical conditions you have. Whether you are pregnant or may be pregnant. Any anxiety disorders, chronic pain, or other conditions you have that may increase your stress or prevent you from lying still. What are the risks? Generally, this is a safe procedure. However, problems may occur, including: Bleeding. Infection. Allergic reactions to medicines or dyes. Damage to other structures or organs. Kidney damage from the contrast dye that is used. Increased risk of cancer from radiation exposure. This risk is low. Talk with your health care provider about: The risks and benefits of testing. How you can receive the lowest dose of radiation. What happens before the procedure? Wear comfortable clothing and remove any jewelry, glasses, dentures, and hearing aids. Follow instructions from your health care provider about eating and drinking. This may include: For 12 hours before the procedure -- avoid caffeine. This includes tea, coffee, soda, energy drinks, and diet pills. Drink plenty of water or other fluids that do not have caffeine in them. Being well hydrated can prevent complications. For 4-6 hours before the procedure --  stop eating and drinking. The contrast dye can cause nausea, but this is less likely if your stomach is empty. Ask your health care provider about changing or stopping your regular medicines. This is especially important if you are taking diabetes medicines, blood thinners, or medicines to treat problems with erections (erectile dysfunction). What happens during the procedure?  Hair on your chest may need to be removed so that small sticky patches called electrodes  can be placed on your chest. These will transmit information that helps to monitor your heart during the procedure. An IV will be inserted into one of your veins. You might be given a medicine to control your heart rate during the procedure. This will help to ensure that good images are obtained. You will be asked to lie on an exam table. This table will slide in and out of the CT machine during the procedure. Contrast dye will be injected into the IV. You might feel warm, or you may get a metallic taste in your mouth. You will be given a medicine called nitroglycerin. This will relax or dilate the arteries in your heart. The table that you are lying on will move into the CT machine tunnel for the scan. The person running the machine will give you instructions while the scans are being done. You may be asked to: Keep your arms above your head. Hold your breath. Stay very still, even if the table is moving. When the scanning is complete, you will be moved out of the machine. The IV will be removed. The procedure may vary among health care providers and hospitals. What can I expect after the procedure? After your procedure, it is common to have: A metallic taste in your mouth from the contrast dye. A feeling of warmth. A headache from the nitroglycerin. Follow these instructions at home: Take over-the-counter and prescription medicines only as told by your health care provider. If you are told, drink enough fluid to keep your urine pale yellow. This will help to flush the contrast dye out of your body. Most people can return to their normal activities right after the procedure. Ask your health care provider what activities are safe for you. It is up to you to get the results of your procedure. Ask your health care provider, or the department that is doing the procedure, when your results will be ready. Keep all follow-up visits as told by your health care provider. This is important. Contact a  health care provider if: You have any symptoms of allergy to the contrast dye. These include: Shortness of breath. Rash or hives. A racing heartbeat. Summary A cardiac CT angiogram is a procedure to look at the heart and the area around the heart. It may be done to help find the cause of chest pains or other symptoms of heart disease. During this procedure, a large X-ray machine, called a CT scanner, takes detailed pictures of the heart and the surrounding area after a contrast dye has been injected into blood vessels in the area. Ask your health care provider about changing or stopping your regular medicines before the procedure. This is especially important if you are taking diabetes medicines, blood thinners, or medicines to treat erectile dysfunction. If you are told, drink enough fluid to keep your urine pale yellow. This will help to flush the contrast dye out of your body. This information is not intended to replace advice given to you by your health care provider. Make sure you discuss any questions  you have with your health care provider. Document Revised: 09/23/2021 Document Reviewed: 01/30/2019 Elsevier Patient Education  [place].

## 2022-04-15 ENCOUNTER — Telehealth: Payer: Self-pay | Admitting: *Deleted

## 2022-04-15 LAB — COMPREHENSIVE METABOLIC PANEL
ALT: 20 IU/L (ref 0–32)
AST: 22 IU/L (ref 0–40)
Albumin/Globulin Ratio: 1.5 (ref 1.2–2.2)
Albumin: 4.9 g/dL (ref 3.9–4.9)
Alkaline Phosphatase: 70 IU/L (ref 44–121)
BUN/Creatinine Ratio: 11 — ABNORMAL LOW (ref 12–28)
BUN: 12 mg/dL (ref 8–27)
Bilirubin Total: 0.9 mg/dL (ref 0.0–1.2)
CO2: 26 mmol/L (ref 20–29)
Calcium: 10 mg/dL (ref 8.7–10.3)
Chloride: 100 mmol/L (ref 96–106)
Creatinine, Ser: 1.05 mg/dL — ABNORMAL HIGH (ref 0.57–1.00)
Globulin, Total: 3.2 g/dL (ref 1.5–4.5)
Glucose: 138 mg/dL — ABNORMAL HIGH (ref 70–99)
Potassium: 3.7 mmol/L (ref 3.5–5.2)
Sodium: 140 mmol/L (ref 134–144)
Total Protein: 8.1 g/dL (ref 6.0–8.5)
eGFR: 59 mL/min/{1.73_m2} — ABNORMAL LOW (ref >59)

## 2022-04-15 LAB — LIPID PANEL
Chol/HDL Ratio: 3.9 ratio (ref 0.0–4.4)
Cholesterol, Total: 204 mg/dL — ABNORMAL HIGH (ref 100–199)
HDL: 52 mg/dL
LDL Chol Calc (NIH): 123 mg/dL — ABNORMAL HIGH (ref 0–99)
Triglycerides: 167 mg/dL — ABNORMAL HIGH (ref 0–149)
VLDL Cholesterol Cal: 29 mg/dL (ref 5–40)

## 2022-04-15 NOTE — Telephone Encounter (Signed)
Contacted regarding PREP Class referral. Voice message left for return call.

## 2022-04-25 ENCOUNTER — Ambulatory Visit: Payer: Self-pay | Admitting: Licensed Clinical Social Worker

## 2022-04-25 ENCOUNTER — Telehealth (HOSPITAL_BASED_OUTPATIENT_CLINIC_OR_DEPARTMENT_OTHER): Payer: Self-pay

## 2022-04-25 DIAGNOSIS — E7800 Pure hypercholesterolemia, unspecified: Secondary | ICD-10-CM

## 2022-04-25 MED ORDER — ATORVASTATIN CALCIUM 40 MG PO TABS: 40.0000 mg | ORAL_TABLET | Freq: Every day | ORAL | 3 refills | Status: DC

## 2022-04-25 NOTE — Telephone Encounter (Addendum)
Left message for patient to call back     ----- Message from Loel Dubonnet, NP sent at 04/25/2022  4:14 PM EST ----- Normal liver enzymes.  Kidney function slightly decreased from previous.  Likely dehydrated-recommend increase p.o. fluid intake.  Cholesterol numbers are improved from previous but LDL (bad cholesterol) not at goal of less than 70 and total cholesterol not at goal of less than 200.  Can we please confirm what dose of Atorvastatin she is on? PCP increased to '40mg'$  10/2021 but unclear if she has been taking stronger dose.  If taking Atorvastatin '20mg'$  - increase to '40mg'$  with LFT/FLP in 3 months If taking Atorvastatin '40mg'$  - add Zetia '10mg'$  daily with LFT/FLP in 3 months

## 2022-04-25 NOTE — Telephone Encounter (Signed)
Patient returned call to the office,patient currently taking the '20mg'$  tabs. Will increase to '40mg'$  with repeat labs in 3 months.  Labs ordered and mailed, prescriptions updated and sent to pharmacy on file.

## 2022-04-25 NOTE — Patient Outreach (Signed)
  Care Coordination   04/25/2022 Name: [name] [name] MRN: [id number] DOB: 21-Aug-1955   Care Coordination Outreach Attempts:  An unsuccessful telephone outreach was attempted today to offer the patient information about available care coordination services as a benefit of their health plan.   Follow Up Plan:  No further outreach attempts will be made at this time. We have been unable to contact the patient to offer or enroll patient in care coordination services  Encounter Outcome:  Pt. Visit Completed  Care Coordination Interventions Activated:  Yes   Care Coordination Interventions:  Yes, provided    Lenor Derrick, MSW, [place]  Social Worker [facility]  [phone]

## 2022-04-25 NOTE — Addendum Note (Signed)
Addended by: Gerald Stabs on: 04/25/2022 05:03 PM   Modules accepted: Orders

## 2022-04-27 ENCOUNTER — Telehealth (HOSPITAL_COMMUNITY): Payer: Self-pay | Admitting: *Deleted

## 2022-04-27 NOTE — Telephone Encounter (Signed)
Reaching out to patient to offer assistance regarding upcoming cardiac imaging study; pt verbalizes understanding of appt date/time, parking situation and where to check in, and verified current allergies; name and call back number provided for further questions should they arise  [name] Clement RN Navigator Cardiac [place] [facility] [phone] office [phone] cell  Patient aware to arrive at 7:30am.

## 2022-04-27 NOTE — Telephone Encounter (Signed)
Attempted to call patient regarding upcoming cardiac CT appointment. °Left message on voicemail with name and callback number ° °Layaan Mott RN Navigator Cardiac Imaging °[facility] °[phone] Office °[phone] Cell ° °

## 2022-04-28 ENCOUNTER — Ambulatory Visit (HOSPITAL_COMMUNITY)
Admission: RE | Admit: 2022-04-28 | Discharge: 2022-04-28 | Disposition: A | Discharge status: Home / Self Care | Payer: Medicare Other | Source: Ambulatory Visit | Attending: Cardiovascular Disease | Admitting: Cardiovascular Disease

## 2022-04-28 DIAGNOSIS — I10 Essential (primary) hypertension: Secondary | ICD-10-CM | POA: Diagnosis not present

## 2022-04-28 DIAGNOSIS — R0609 Other forms of dyspnea: Secondary | ICD-10-CM | POA: Diagnosis not present

## 2022-04-28 DIAGNOSIS — I700 Atherosclerosis of aorta: Secondary | ICD-10-CM | POA: Insufficient documentation

## 2022-04-28 DIAGNOSIS — R002 Palpitations: Secondary | ICD-10-CM | POA: Diagnosis not present

## 2022-04-28 DIAGNOSIS — I2510 Atherosclerotic heart disease of native coronary artery without angina pectoris: Secondary | ICD-10-CM | POA: Diagnosis not present

## 2022-04-28 DIAGNOSIS — R0789 Other chest pain: Secondary | ICD-10-CM | POA: Diagnosis not present

## 2022-04-28 MED ORDER — IOHEXOL 350 MG/ML SOLN
95.0000 mL | Freq: Once | INTRAVENOUS | Status: AC | PRN
  Administered 2022-04-28: 95 mL via INTRAVENOUS

## 2022-04-28 MED ORDER — NITROGLYCERIN 0.4 MG SL SUBL: 0.8000 mg | SUBLINGUAL_TABLET | Freq: Once | SUBLINGUAL | Status: AC

## 2022-04-28 MED ORDER — NITROGLYCERIN 0.4 MG SL SUBL
SUBLINGUAL_TABLET | SUBLINGUAL | Status: AC
  Administered 2022-04-28: 0.8 mg via SUBLINGUAL
  Filled 2022-04-28: qty 2

## 2022-04-29 ENCOUNTER — Encounter: Payer: Medicare Other | Attending: Physical Medicine & Rehabilitation | Admitting: Registered Nurse

## 2022-04-29 ENCOUNTER — Telehealth (HOSPITAL_BASED_OUTPATIENT_CLINIC_OR_DEPARTMENT_OTHER): Payer: Self-pay

## 2022-04-29 ENCOUNTER — Encounter: Payer: Self-pay | Admitting: Registered Nurse

## 2022-04-29 VITALS — BP 137/79 | HR 56 | Ht 64.0 in | Wt 225.8 lb

## 2022-04-29 DIAGNOSIS — M4807 Spinal stenosis, lumbosacral region: Secondary | ICD-10-CM | POA: Insufficient documentation

## 2022-04-29 DIAGNOSIS — M961 Postlaminectomy syndrome, not elsewhere classified: Secondary | ICD-10-CM | POA: Insufficient documentation

## 2022-04-29 DIAGNOSIS — M778 Other enthesopathies, not elsewhere classified: Secondary | ICD-10-CM | POA: Insufficient documentation

## 2022-04-29 DIAGNOSIS — G894 Chronic pain syndrome: Secondary | ICD-10-CM | POA: Insufficient documentation

## 2022-04-29 DIAGNOSIS — Z5181 Encounter for therapeutic drug level monitoring: Secondary | ICD-10-CM | POA: Diagnosis not present

## 2022-04-29 DIAGNOSIS — R001 Bradycardia, unspecified: Secondary | ICD-10-CM | POA: Diagnosis not present

## 2022-04-29 DIAGNOSIS — Z79891 Long term (current) use of opiate analgesic: Secondary | ICD-10-CM | POA: Diagnosis not present

## 2022-04-29 DIAGNOSIS — M62838 Other muscle spasm: Secondary | ICD-10-CM | POA: Diagnosis not present

## 2022-04-29 MED ORDER — MORPHINE SULFATE ER 15 MG PO TBCR: 15.0000 mg | EXTENDED_RELEASE_TABLET | Freq: Three times a day (TID) | ORAL | 0 refills | Status: DC

## 2022-04-29 MED ORDER — ASPIRIN 81 MG PO TBEC: 81.0000 mg | DELAYED_RELEASE_TABLET | Freq: Every day | ORAL | 3 refills | Status: AC

## 2022-04-29 MED ORDER — MORPHINE SULFATE 15 MG PO TABS: 15.0000 mg | ORAL_TABLET | Freq: Two times a day (BID) | ORAL | 0 refills | Status: DC | PRN

## 2022-04-29 NOTE — Telephone Encounter (Addendum)
Seen by patient [name] [name] on 04/28/2022  9:56 PM; follow up mychart message sent to patient. Rx updated.    ----- Message from Loel Dubonnet, NP sent at 04/28/2022  9:30 PM EST ----- Cardiac CTA with minimal nonobstructive coronary artery disease. Not enough to cause symptoms but recommend secondary prevention. Start Aspirin EC '81mg'$  daily. Increase Atorvastatin to '40mg'$  daily as previously discussed with nurse.

## 2022-04-29 NOTE — Progress Notes (Signed)
Subjective:    Patient ID: [name] [name], female    DOB: 1956-02-03, 66 y.o.   MRN: [id number]  HPI: [name] [name] is a 66 y.o. female who returns for follow up appointment for chronic pain and medication refill. She states her pain is located in her lower back. She rates her pain 8. Her current exercise regime is walking and performing stretching exercises.  Ms. Amison Morphine equivalent is 75.00 MME.   Last UDS was Performed on 03/29/2022, it was consistent.    Pain Inventory Average Pain 8 Pain Right Now 8 My pain is constant, sharp, and aching  In the last 24 hours, has pain interfered with the following? General activity 8 Relation with others 8 Enjoyment of life 8 What TIME of day is your pain at its worst? morning , daytime, and night Sleep (in general) Poor  Pain is worse with: walking, sitting, inactivity, and standing Pain improves with: rest, heat/ice, and medication Relief from Meds: 1  Family History  Problem Relation Age of Onset   Hyperlipidemia Mother    Heart attack Mother    Heart disease Mother 57       CHF; CAD   Hypertension Mother    Heart failure Mother    Diabetes Sister    Lupus Sister    Kidney failure Sister    Hyperlipidemia Sister    Hypertension Sister    Stroke Maternal Aunt    Hypertension Brother    Colon cancer Neg Hx    Rectal cancer Neg Hx    Breast cancer Neg Hx    Social History   Socioeconomic History   Marital status: Widowed    Spouse name: Not on file   Number of children: 3   Years of education: Not on file   Highest education level: Not on file  Occupational History   Occupation: disability    Comment: 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  Tobacco Use   Smoking status: Former    Packs/day: 0.50    Years: 20.00    Total pack years: 10.00    Types: Cigarettes    Quit date: 07/21/2008    Years since quitting: 13.7   Smokeless tobacco: Never  Vaping Use   Vaping Use: Never used  Substance and Sexual Activity   Alcohol  use: No   Drug use: No   Sexual activity: Not on file  Other Topics Concern   Not on file  Social History Narrative   Marital status: widowed since 2002; not dating which is bad in 2018      Children: 3 children (45, 54, 40); 13 grandchildren; 1 gg      Employment: disability since 2009; retail      Lives: alone in [place] in apartment      Tobacco: quit in 2011      Alcohol: none      Drugs: none      Exercise: minimal in 2018      ADLs: independent with ADLs; drives      Seatbelt: 100%; no texting.   Social Determinants of Health   Financial Resource Strain: Not on file  Food Insecurity: No Food Insecurity (04/11/2022)   Hunger Vital Sign    Worried About Running Out of Food in the Last Year: Never true    Ran Out of Food in the Last Year: Never true  Transportation Needs: No Transportation Needs (04/11/2022)   PRAPARE - Hydrologist (Medical): No  Lack of Transportation (Non-Medical): No  Physical Activity: Not on file  Stress: Not on file  Social Connections: Not on file   Past Surgical History:  Procedure Laterality Date   ABDOMINAL HYSTERECTOMY  1982   DUB; ovaries intact   bladder surg  1992   bladder retraction   BREAST BIOPSY     BREAST REDUCTION SURGERY  1984   CARDIAC CATHETERIZATION  [place]  05/11/11   [facility] Lysis of adhesions Crohn's   MASS EXCISION Left 01/03/2018   Procedure: EXCISION LEFT UPPER BACK LIPOMA ERAS PATH;  Surgeon: Erroll Luna, MD;  Location: [facility];  Service: General;  Laterality: Left;   REDUCTION MAMMAPLASTY     SPINE SURGERY     5 total (1 upper, 4 lumbar)   SPINE SURGERY     nerve stimulator on right side   Past Surgical History:  Procedure Laterality Date   ABDOMINAL HYSTERECTOMY  1982   DUB; ovaries intact   bladder surg  1992   bladder retraction   BREAST BIOPSY     BREAST REDUCTION SURGERY  1984   CARDIAC CATHETERIZATION  1990's   [name]   COLON SURGERY  05/11/11   [facility] Lysis of adhesions Crohn's   MASS EXCISION Left 01/03/2018   Procedure: EXCISION LEFT UPPER BACK LIPOMA ERAS PATH;  Surgeon: Erroll Luna, MD;  Location: [facility];  Service: General;  Laterality: Left;   REDUCTION MAMMAPLASTY     SPINE SURGERY     5 total (1 upper, 4 lumbar)   SPINE SURGERY     nerve stimulator on right side   Past Medical History:  Diagnosis Date   Anemia    Blood transfusion without reported diagnosis    Crohn's disease ([place])    Disorder of sacroiliac joint    Exertional dyspnea 05/28/2021   Hyperlipidemia    Hypertension    Ischemic colitis ([place])    Lumbago    Lumbar post-laminectomy syndrome    Lumbosacral neuritis    Lumbosacral radiculitis    OSA (obstructive sleep apnea) 05/28/2021   Sciatica    BP 137/79   Pulse (!) 56   Ht '5\' 4"'$  (1.626 m)   Wt 225 lb 12.8 oz (102.4 kg)   SpO2 95%   BMI 38.76 kg/m   Opioid Risk Score:   Fall Risk Score:  `1  Depression screen [facility] 2/9     04/29/2022    9:32 AM 04/06/2022   11:50 AM 02/22/2022    1:48 PM 01/28/2022    8:31 AM 01/25/2022    9:27 AM 12/31/2021   10:56 AM 12/09/2021    9:25 AM  Depression screen PHQ 2/9  Decreased Interest 0 0 0 2 0 0 0  Down, Depressed, Hopeless 0 0 0 0 0 0 0  PHQ - 2 Score 0 0 0 2 0 0 0  Altered sleeping    1     Tired, decreased energy    3     Change in appetite    3     Feeling bad or failure about yourself     0     Trouble concentrating    1     Moving slowly or fidgety/restless    2     Suicidal thoughts    0     PHQ-9 Score    12         Review of Systems  Musculoskeletal:  Positive for back pain.       R shoulder pain  B/L hand pain  All other systems reviewed and are negative.     Objective:   Physical Exam Vitals and nursing note reviewed.  Constitutional:      Appearance: Normal appearance.  Cardiovascular:     Rate and Rhythm: Normal rate and regular rhythm.     Pulses: Normal pulses.      Heart sounds: Normal heart sounds.  Pulmonary:     Effort: Pulmonary effort is normal.     Breath sounds: Normal breath sounds.  Musculoskeletal:     Cervical back: Normal range of motion and neck supple.     Comments: Normal Muscle Bulk and Muscle Testing Reveals:  Upper Extremities: Full ROM and Muscle Strength 5/5  Lumbar Paraspinal Tenderness: L-3-L-5 Lower Extremities: Full ROM and Muscle Strength 5/5 Arises from Table slowly Narrow Based  Gait     Skin:    General: Skin is warm and dry.  Neurological:     Mental Status: She is alert and oriented to person, place, and time.  Psychiatric:        Mood and Affect: Mood normal.        Behavior: Behavior normal.         Assessment & Plan:  1.Lumbar Postlaminectomy/ Spinal Stenosis Lumbar Region/ Post-Op Pain/Low back pain with radiating symptoms in L3-4 distribution:  continues to be limited by pain. Refilled:  MSIR 15 mg one tablet twice a day as needed for moderate to sever pain #60 and  Morphine Sulfate ER 15 mg Q 8 hours. 04/29/2022. We will continue the opioid monitoring program, this consists of regular clinic visits, examinations, urine drug screen, pill counts as well as use of [place] Controlled Substance Reporting system. A 12 month History has been reviewed on the [place] Controlled Substance Reporting System on 10/18/2020. Encouraged to Continue exercise regime.   2. Lumbar Radiculopathy: No complaints today. Continue Topamax:.Continue to Monitor 04/29/2022 3.Chronic  Right Knee Pain: No complaints today.  Continue with Ice Therapy and  Voltaren Gel. 04/29/2022 4. Muscle Spasm: Continue current treatment: Tizanidine. 04/29/2022  5. Lipoma: S/P Left  Lipoma Excision of upper back  By Dr. Brantley Stage on 01/03/2018. Surgery Following. Continue to Monitor. 04/29/2022.   6. Paresthesia of Skin: Continue Topamax. Continue to monitor. 04/29/2022 7. Adhesive Capsulitis of Right Shoulder:Right Shoulder Tendonitis Pain:   No complaints today. S/P Right Shoulder Cortisone Injection with Dr Letta Pate, with good relief noted. Continue to monitor. Continue HEP as Tolerated. 04/29/2022  8. Polyarthralgia: Continue HEP as Tolerated. Continue to Monitor.04/29/2022  9. Bradycardia: Apical Pulse check:  Ms. Dix states Cardiology following. Continue to Monitor. 04/29/2022  10. Sacral Pain: No complaints today. Continue to Monitor. 04/29/2022   F/U in 1 month      `

## 2022-05-03 ENCOUNTER — Ambulatory Visit: Payer: Self-pay

## 2022-05-03 NOTE — Patient Outreach (Signed)
  Care Coordination   05/03/2022 Name: NEKESHA FONT MRN: [id number] DOB: 1956-01-28   Care Coordination Outreach Attempts:  An unsuccessful telephone outreach was attempted today to offer the patient information about available care coordination services as a benefit of their health plan.   Follow Up Plan:  Additional outreach attempts will be made to offer the patient care coordination information and services.   Encounter Outcome:  No Answer  Care Coordination Interventions Activated:  No   Care Coordination Interventions:  No, not indicated    Jone Baseman, RN, MSN [facility] Care Management Care Management Coordinator Direct Line [phone]

## 2022-05-06 ENCOUNTER — Encounter: Payer: Medicare Other | Admitting: Registered Nurse

## 2022-05-21 ENCOUNTER — Other Ambulatory Visit (HOSPITAL_BASED_OUTPATIENT_CLINIC_OR_DEPARTMENT_OTHER): Payer: Self-pay | Admitting: Cardiovascular Disease

## 2022-05-23 NOTE — Telephone Encounter (Signed)
Rx request sent to pharmacy.  

## 2022-05-24 ENCOUNTER — Encounter: Payer: Medicare Other | Attending: Physical Medicine & Rehabilitation | Admitting: Registered Nurse

## 2022-05-24 ENCOUNTER — Encounter: Payer: Self-pay | Admitting: Registered Nurse

## 2022-05-24 VITALS — BP 149/74 | HR 56 | Ht 64.0 in | Wt 216.0 lb

## 2022-05-24 DIAGNOSIS — Z79891 Long term (current) use of opiate analgesic: Secondary | ICD-10-CM | POA: Diagnosis not present

## 2022-05-24 DIAGNOSIS — E7800 Pure hypercholesterolemia, unspecified: Secondary | ICD-10-CM | POA: Diagnosis not present

## 2022-05-24 DIAGNOSIS — R001 Bradycardia, unspecified: Secondary | ICD-10-CM | POA: Diagnosis not present

## 2022-05-24 DIAGNOSIS — Z5181 Encounter for therapeutic drug level monitoring: Secondary | ICD-10-CM | POA: Diagnosis not present

## 2022-05-24 DIAGNOSIS — M961 Postlaminectomy syndrome, not elsewhere classified: Secondary | ICD-10-CM

## 2022-05-24 DIAGNOSIS — M62838 Other muscle spasm: Secondary | ICD-10-CM

## 2022-05-24 DIAGNOSIS — G894 Chronic pain syndrome: Secondary | ICD-10-CM | POA: Diagnosis not present

## 2022-05-24 DIAGNOSIS — M4807 Spinal stenosis, lumbosacral region: Secondary | ICD-10-CM | POA: Diagnosis not present

## 2022-05-24 MED ORDER — MORPHINE SULFATE 15 MG PO TABS: 15.0000 mg | ORAL_TABLET | Freq: Two times a day (BID) | ORAL | 0 refills | Status: DC | PRN

## 2022-05-24 MED ORDER — MORPHINE SULFATE ER 15 MG PO TBCR: 15.0000 mg | EXTENDED_RELEASE_TABLET | Freq: Three times a day (TID) | ORAL | 0 refills | Status: DC

## 2022-05-24 NOTE — Progress Notes (Signed)
Subjective:    Patient ID: [name] [name], female    DOB: [birth year], 66 y.o.   MRN: [id number]  HPI: [name] [name] is a 66 y.o. female who returns for follow up appointment for chronic pain and medication refill. She states  her pain is located in her . Lower back. She rates her pain 7. Her current exercise regime is walking and performing stretching exercises.  [name] [name] Morphine equivalent is 75.00 MME.   Last UDS was Performed on 03/29/2022, it was consistent.      Pain Inventory Average Pain 7 Pain Right Now 7 My pain is constant, sharp, stabbing, and aching  In the last 24 hours, has pain interfered with the following? General activity 7 Relation with others 7 Enjoyment of life 7 What TIME of day is your pain at its worst? morning  and daytime Sleep (in general) Poor  Pain is worse with: walking, inactivity, and standing Pain improves with: rest, heat/ice, and medication Relief from Meds: 2  Family History  Problem Relation Age of Onset   Hyperlipidemia Mother    Heart attack Mother    Heart disease Mother 69       CHF; CAD   Hypertension Mother    Heart failure Mother    Diabetes Sister    Lupus Sister    Kidney failure Sister    Hyperlipidemia Sister    Hypertension Sister    Stroke Maternal Aunt    Hypertension Brother    Colon cancer Neg Hx    Rectal cancer Neg Hx    Breast cancer Neg Hx    Social History   Socioeconomic History   Marital status: Widowed    Spouse name: Not on file   Number of children: 3   Years of education: Not on file   Highest education level: Not on file  Occupational History   Occupation: disability    Comment: 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  Tobacco Use   Smoking status: Former    Packs/day: 0.50    Years: 20.00    Total pack years: 10.00    Types: Cigarettes    Quit date: 07/21/2008    Years since quitting: 13.8   Smokeless tobacco: Never  Vaping Use   Vaping Use: Never used  Substance and Sexual Activity    Alcohol use: No   Drug use: No   Sexual activity: Not on file  Other Topics Concern   Not on file  Social History Narrative   Marital status: widowed since 2002; not dating which is bad in 2018      Children: 3 children (45, 57, 40); 13 grandchildren; 1 gg      Employment: disability since 2009; retail      Lives: alone in [place] in apartment      Tobacco: quit in 2011      Alcohol: none      Drugs: none      Exercise: minimal in 2018      ADLs: independent with ADLs; drives      Seatbelt: 100%; no texting.   Social Determinants of Health   Financial Resource Strain: Not on file  Food Insecurity: No Food Insecurity (04/11/2022)   Hunger Vital Sign    Worried About Running Out of Food in the Last Year: Never true    Ran Out of Food in the Last Year: Never true  Transportation Needs: No Transportation Needs (04/11/2022)   PRAPARE - Transportation    Lack of Transportation (  Medical): No    Lack of Transportation (Non-Medical): No  Physical Activity: Not on file  Stress: Not on file  Social Connections: Not on file   Past Surgical History:  Procedure Laterality Date   ABDOMINAL HYSTERECTOMY  1982   DUB; ovaries intact   bladder surg  1992   bladder retraction   BREAST BIOPSY     BREAST REDUCTION SURGERY  1984   CARDIAC CATHETERIZATION  [place]  05/11/11   Duke Lysis of adhesions Crohn's   MASS EXCISION Left 01/03/2018   Procedure: EXCISION LEFT UPPER BACK LIPOMA ERAS PATH;  Surgeon: Erroll Luna, MD;  Location: [place];  Service: General;  Laterality: Left;   REDUCTION MAMMAPLASTY     SPINE SURGERY     5 total (1 upper, 4 lumbar)   SPINE SURGERY     nerve stimulator on right side   Past Surgical History:  Procedure Laterality Date   ABDOMINAL HYSTERECTOMY  1982   DUB; ovaries intact   bladder surg  1992   bladder retraction   BREAST BIOPSY     BREAST REDUCTION SURGERY  1984   CARDIAC CATHETERIZATION  1990's    [name]   COLON SURGERY  05/11/11   Duke Lysis of adhesions Crohn's   MASS EXCISION Left 01/03/2018   Procedure: EXCISION LEFT UPPER BACK LIPOMA ERAS PATH;  Surgeon: Erroll Luna, MD;  Location: [place];  Service: General;  Laterality: Left;   REDUCTION MAMMAPLASTY     SPINE SURGERY     5 total (1 upper, 4 lumbar)   SPINE SURGERY     nerve stimulator on right side   Past Medical History:  Diagnosis Date   Anemia    Blood transfusion without reported diagnosis    Crohn's disease ([place])    Disorder of sacroiliac joint    Exertional dyspnea 05/28/2021   Hyperlipidemia    Hypertension    Ischemic colitis ([place])    Lumbago    Lumbar post-laminectomy syndrome    Lumbosacral neuritis    Lumbosacral radiculitis    OSA (obstructive sleep apnea) 05/28/2021   Sciatica    There were no vitals taken for this visit.  Opioid Risk Score:   Fall Risk Score:  `1  Depression screen [facility] 2/9     04/29/2022    9:32 AM 04/06/2022   11:50 AM 02/22/2022    1:48 PM 01/28/2022    8:31 AM 01/25/2022    9:27 AM 12/31/2021   10:56 AM 12/09/2021    9:25 AM  Depression screen PHQ 2/9  Decreased Interest 0 0 0 2 0 0 0  Down, Depressed, Hopeless 0 0 0 0 0 0 0  PHQ - 2 Score 0 0 0 2 0 0 0  Altered sleeping    1     Tired, decreased energy    3     Change in appetite    3     Feeling bad or failure about yourself     0     Trouble concentrating    1     Moving slowly or fidgety/restless    2     Suicidal thoughts    0     PHQ-9 Score    12       Review of Systems  Musculoskeletal:  Positive for back pain.  All other systems reviewed and are negative.      Objective:  Physical Exam Vitals and nursing note reviewed.  Constitutional:      Appearance: Normal appearance.  Cardiovascular:     Rate and Rhythm: Bradycardia present.     Pulses: Normal pulses.     Heart sounds: Normal heart sounds.  Pulmonary:     Effort: Pulmonary effort is normal.     Breath sounds:  Normal breath sounds.  Musculoskeletal:     Cervical back: Normal range of motion and neck supple.     Comments: Normal Muscle Bulk and Muscle Testing Reveals:  Upper Extremities: Full ROM and Muscle Strength 5/5  Lumbar Paraspinal Tenderness: L-3-L-5 Mainly Right Side  Lower Extremities: Full ROM and Muscle Strength 5/5 Arises from Table slowly Narrow Based  Gait     Skin:    General: Skin is warm and dry.  Neurological:     Mental Status: She is alert and oriented to person, place, and time.  Psychiatric:        Mood and Affect: Mood normal.        Behavior: Behavior normal.         Assessment & Plan:  1.Lumbar Postlaminectomy/ Spinal Stenosis Lumbar Region/ Post-Op Pain/Low back pain with radiating symptoms in L3-4 distribution:  continues to be limited by pain. Refilled:  MSIR 15 mg one tablet twice a day as needed for moderate to sever pain #60 and  Morphine Sulfate ER 15 mg Q 8 hours. 05/24/2022. We will continue the opioid monitoring program, this consists of regular clinic visits, examinations, urine drug screen, pill counts as well as use of [place] Controlled Substance Reporting system. A 12 month History has been reviewed on the [place] Controlled Substance Reporting System on 05/24/2022. Encouraged to Continue exercise regime.   2. Lumbar Radiculopathy: No complaints today. Continue Topamax:.Continue to Monitor 05/24/2022 3.Chronic  Right Knee Pain: No complaints today.  Continue with Ice Therapy and  Voltaren Gel. 05/24/2022 4. Muscle Spasm: Continue current treatment: Tizanidine. 05/24/2022  5. Lipoma: S/P Left  Lipoma Excision of upper back  By Dr. Brantley Stage on 01/03/2018. Surgery Following. Continue to Monitor. 05/24/2022.   6. Paresthesia of Skin: Continue Topamax. Continue to monitor. 05/24/2022 7. Adhesive Capsulitis of Right Shoulder:Right Shoulder Tendonitis Pain:  No complaints today. S/P Right Shoulder Cortisone Injection with Dr Letta Pate, with good  relief noted. Continue to monitor. Continue HEP as Tolerated. 05/24/2022  8. Polyarthralgia: Continue HEP as Tolerated. Continue to Monitor.05/24/2022  9. Bradycardia: Apical Pulse check:  [name] [name] states Cardiology following. Continue to Monitor. 05/24/2022  10. Sacral Pain: No complaints today. Continue to Monitor. 05/24/2022   F/U in 1 month

## 2022-05-25 LAB — LIPID PANEL
Chol/HDL Ratio: 4.1 ratio (ref 0.0–4.4)
Cholesterol, Total: 176 mg/dL (ref 100–199)
HDL: 43 mg/dL (ref >39)
LDL Chol Calc (NIH): 110 mg/dL — ABNORMAL HIGH (ref 0–99)
Triglycerides: 129 mg/dL (ref 0–149)
VLDL Cholesterol Cal: 23 mg/dL (ref 5–40)

## 2022-05-25 LAB — HEPATIC FUNCTION PANEL
ALT: 24 IU/L (ref 0–32)
AST: 28 IU/L (ref 0–40)
Albumin: 4.8 g/dL (ref 3.9–4.9)
Alkaline Phosphatase: 69 IU/L (ref 44–121)
Bilirubin Total: 1.2 mg/dL (ref 0.0–1.2)
Bilirubin, Direct: 0.28 mg/dL (ref 0.00–0.40)
Total Protein: 7.3 g/dL (ref 6.0–8.5)

## 2022-05-26 ENCOUNTER — Telehealth (HOSPITAL_BASED_OUTPATIENT_CLINIC_OR_DEPARTMENT_OTHER): Payer: Self-pay

## 2022-05-26 ENCOUNTER — Telehealth: Payer: Self-pay

## 2022-05-26 DIAGNOSIS — E7800 Pure hypercholesterolemia, unspecified: Secondary | ICD-10-CM

## 2022-05-26 MED ORDER — MORPHINE SULFATE 15 MG PO TABS: 15.0000 mg | ORAL_TABLET | Freq: Two times a day (BID) | ORAL | 0 refills | Status: DC | PRN

## 2022-05-26 MED ORDER — MORPHINE SULFATE ER 15 MG PO TBCR: 15.0000 mg | EXTENDED_RELEASE_TABLET | Freq: Three times a day (TID) | ORAL | 0 refills | Status: DC

## 2022-05-26 NOTE — Telephone Encounter (Addendum)
Seen by patient [name] [name] on 05/26/2022 10:50 AM; follow up mychart message to patient.    ----- Message from Loel Dubonnet, NP sent at 05/26/2022 10:49 AM EST ----- Normal liver enzymes. Cholesterol panel with LDL (bad cholesterol) not at goal of less than 70. It decreased from 123 to 110 in one month. On 04/25/22 she was instructed to increase to '40mg'$  and have repeat labs in 3 months. Unclear why she had labs early. Recommend she have LFT/FLP in 2 months.

## 2022-05-26 NOTE — Telephone Encounter (Signed)
Please send in the ER 12 hr Morphine to [facility] on [address]. I will cancel the IR by phone.

## 2022-05-26 NOTE — Telephone Encounter (Signed)
Pt current pharmacy didn't have current prescription but [facility] does, I have updated the pharmacy that has it, if you could please send for pt

## 2022-05-26 NOTE — Addendum Note (Signed)
Addended by: Charlett Blake on: 05/26/2022 03:29 PM   Modules accepted: Orders

## 2022-05-26 NOTE — Telephone Encounter (Signed)
Patient called back. She has been advised Dr. Letta Pate is aware and will send it in ASAP.

## 2022-05-31 ENCOUNTER — Other Ambulatory Visit: Payer: Self-pay | Admitting: Family

## 2022-05-31 ENCOUNTER — Other Ambulatory Visit: Payer: Self-pay

## 2022-05-31 DIAGNOSIS — J301 Allergic rhinitis due to pollen: Secondary | ICD-10-CM

## 2022-05-31 MED ORDER — FLUTICASONE PROPIONATE 50 MCG/ACT NA SUSP: NASAL | 1 refills | Status: DC

## 2022-06-02 ENCOUNTER — Ambulatory Visit: Payer: Self-pay | Admitting: Licensed Clinical Social Worker

## 2022-06-02 ENCOUNTER — Ambulatory Visit: Payer: Self-pay

## 2022-06-02 NOTE — Patient Outreach (Signed)
  Care Coordination   06/02/2022 Name: [name] [name] MRN: [id number] DOB: 12-30-1955   Care Coordination Outreach Attempts:  A second unsuccessful outreach was attempted today to offer the patient with information about available care coordination services as a benefit of their health plan.     Follow Up Plan:  Additional outreach attempts will be made to offer the patient care coordination information and services.   Encounter Outcome:  No Answer   Care Coordination Interventions:  No, not indicated    Jone Baseman, RN, MSN [facility] Care Management Coordinator Direct Line [phone]

## 2022-06-02 NOTE — Patient Outreach (Signed)
SW removed self from [place], [place], MSW, [place]  Social Worker [facility] Care Management  [phone]

## 2022-06-09 ENCOUNTER — Other Ambulatory Visit: Payer: Self-pay | Admitting: Family Medicine

## 2022-06-10 ENCOUNTER — Other Ambulatory Visit: Payer: Self-pay | Admitting: Family Medicine

## 2022-06-10 DIAGNOSIS — E7800 Pure hypercholesterolemia, unspecified: Secondary | ICD-10-CM

## 2022-06-18 ENCOUNTER — Other Ambulatory Visit: Payer: Self-pay | Admitting: Registered Nurse

## 2022-06-24 ENCOUNTER — Encounter: Payer: 59 | Attending: Physical Medicine & Rehabilitation | Admitting: Registered Nurse

## 2022-06-24 ENCOUNTER — Encounter: Payer: Self-pay | Admitting: Registered Nurse

## 2022-06-24 ENCOUNTER — Encounter: Payer: 59 | Admitting: Registered Nurse

## 2022-06-24 VITALS — BP 147/78 | Ht 64.0 in

## 2022-06-24 DIAGNOSIS — G894 Chronic pain syndrome: Secondary | ICD-10-CM | POA: Insufficient documentation

## 2022-06-24 DIAGNOSIS — M4807 Spinal stenosis, lumbosacral region: Secondary | ICD-10-CM | POA: Diagnosis not present

## 2022-06-24 DIAGNOSIS — M961 Postlaminectomy syndrome, not elsewhere classified: Secondary | ICD-10-CM | POA: Insufficient documentation

## 2022-06-24 DIAGNOSIS — Z79891 Long term (current) use of opiate analgesic: Secondary | ICD-10-CM | POA: Diagnosis not present

## 2022-06-24 DIAGNOSIS — Z5181 Encounter for therapeutic drug level monitoring: Secondary | ICD-10-CM | POA: Diagnosis not present

## 2022-06-24 MED ORDER — MORPHINE SULFATE ER 15 MG PO TBCR: 15.0000 mg | EXTENDED_RELEASE_TABLET | Freq: Three times a day (TID) | ORAL | 0 refills | Status: DC

## 2022-06-24 MED ORDER — MORPHINE SULFATE 15 MG PO TABS: 15.0000 mg | ORAL_TABLET | Freq: Two times a day (BID) | ORAL | 0 refills | Status: DC | PRN

## 2022-06-24 NOTE — Progress Notes (Signed)
Subjective:    Patient ID: [name] [name], female    DOB: 05/14/1956, 67 y.o.   MRN: [id number]  HPI: [name] [name] is a 67 y.o. female who is scheduled for My-Chart visit, she called office reporting she wasn't feeling well. Her appointment was changed to My- Chart visit, we have  discussed the limitations of evaluation and management by telemedicine and the availability of in person appointments. The patient expressed understanding and agreed to proceed. She states her pain is located in her lower back. She rates her pain 7. Her current exercise regime is walking short distances.   Ms. Muise Morphine equivalent is 75.00 MME.   Last UDS was performed on 03/29/2022, it was consistent.   Pain Inventory Average Pain 7 Pain Right Now 7 My pain is sharp and aching  In the last 24 hours, has pain interfered with the following? General activity 7 Relation with others 7 Enjoyment of life 7 What TIME of day is your pain at its worst? morning , daytime, and evening Sleep (in general) Poor  Pain is worse with: walking, standing, and some activites Pain improves with: rest and medication Relief from Meds: 2  Family History  Problem Relation Age of Onset   Hyperlipidemia Mother    Heart attack Mother    Heart disease Mother 75       CHF; CAD   Hypertension Mother    Heart failure Mother    Diabetes Sister    Lupus Sister    Kidney failure Sister    Hyperlipidemia Sister    Hypertension Sister    Stroke Maternal Aunt    Hypertension Brother    Colon cancer Neg Hx    Rectal cancer Neg Hx    Breast cancer Neg Hx    Social History   Socioeconomic History   Marital status: Widowed    Spouse name: Not on file   Number of children: 3   Years of education: Not on file   Highest education level: Not on file  Occupational History   Occupation: disability    Comment: 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  Tobacco Use   Smoking status: Former    Packs/day: 0.50    Years: 20.00    Total  pack years: 10.00    Types: Cigarettes    Quit date: 07/21/2008    Years since quitting: 13.9   Smokeless tobacco: Never  Vaping Use   Vaping Use: Never used  Substance and Sexual Activity   Alcohol use: No   Drug use: No   Sexual activity: Not on file  Other Topics Concern   Not on file  Social History Narrative   Marital status: widowed since 2002; not dating which is bad in 2018      Children: 3 children (45, 68, 40); 13 grandchildren; 1 gg      Employment: disability since 2009; retail      Lives: alone in [place] in apartment      Tobacco: quit in 2011      Alcohol: none      Drugs: none      Exercise: minimal in 2018      ADLs: independent with ADLs; drives      Seatbelt: 100%; no texting.   Social Determinants of Health   Financial Resource Strain: Not on file  Food Insecurity: No Food Insecurity (04/11/2022)   Hunger Vital Sign    Worried About Running Out of Food in the Last Year: Never true  Ran Out of Food in the Last Year: Never true  Transportation Needs: No Transportation Needs (04/11/2022)   PRAPARE - Hydrologist (Medical): No    Lack of Transportation (Non-Medical): No  Physical Activity: Not on file  Stress: Not on file  Social Connections: Not on file   Past Surgical History:  Procedure Laterality Date   ABDOMINAL HYSTERECTOMY  1982   DUB; ovaries intact   bladder surg  1992   bladder retraction   BREAST BIOPSY     BREAST REDUCTION SURGERY  1984   CARDIAC CATHETERIZATION  [place]  05/11/11   Duke Lysis of adhesions Crohn's   MASS EXCISION Left 01/03/2018   Procedure: EXCISION LEFT UPPER BACK LIPOMA ERAS PATH;  Surgeon: Erroll Luna, MD;  Location: [place];  Service: General;  Laterality: Left;   REDUCTION MAMMAPLASTY     SPINE SURGERY     5 total (1 upper, 4 lumbar)   SPINE SURGERY     nerve stimulator on right side   Past Surgical History:  Procedure  Laterality Date   ABDOMINAL HYSTERECTOMY  1982   DUB; ovaries intact   bladder surg  1992   bladder retraction   BREAST BIOPSY     BREAST REDUCTION SURGERY  1984   CARDIAC CATHETERIZATION  1990's   [name]   COLON SURGERY  05/11/11   Duke Lysis of adhesions Crohn's   MASS EXCISION Left 01/03/2018   Procedure: EXCISION LEFT UPPER BACK LIPOMA ERAS PATH;  Surgeon: Erroll Luna, MD;  Location: [place];  Service: General;  Laterality: Left;   REDUCTION MAMMAPLASTY     SPINE SURGERY     5 total (1 upper, 4 lumbar)   SPINE SURGERY     nerve stimulator on right side   Past Medical History:  Diagnosis Date   Anemia    Blood transfusion without reported diagnosis    Crohn's disease ([place])    Disorder of sacroiliac joint    Exertional dyspnea 05/28/2021   Hyperlipidemia    Hypertension    Ischemic colitis ([place])    Lumbago    Lumbar post-laminectomy syndrome    Lumbosacral neuritis    Lumbosacral radiculitis    OSA (obstructive sleep apnea) 05/28/2021   Sciatica    There were no vitals taken for this visit.  Opioid Risk Score:   Fall Risk Score:  `1  Depression screen PHQ 2/9     05/24/2022    8:16 AM 04/29/2022    9:32 AM 04/06/2022   11:50 AM 02/22/2022    1:48 PM 01/28/2022    8:31 AM 01/25/2022    9:27 AM 12/31/2021   10:56 AM  Depression screen PHQ 2/9  Decreased Interest 0 0 0 0 2 0 0  Down, Depressed, Hopeless 0 0 0 0 0 0 0  PHQ - 2 Score 0 0 0 0 2 0 0  Altered sleeping     1    Tired, decreased energy     3    Change in appetite     3    Feeling bad or failure about yourself      0    Trouble concentrating     1    Moving slowly or fidgety/restless     2    Suicidal thoughts     0    PHQ-9 Score     12  Review of Systems  Musculoskeletal:  Positive for back pain.  All other systems reviewed and are negative.     Objective:   Physical Exam Vitals and nursing note reviewed.  Constitutional:      Appearance: Normal appearance.   Cardiovascular:     Rate and Rhythm: Normal rate and regular rhythm.     Pulses: Normal pulses.     Heart sounds: Normal heart sounds.  Pulmonary:     Effort: Pulmonary effort is normal.     Breath sounds: Normal breath sounds.  Musculoskeletal:     Cervical back: Normal range of motion and neck supple.     Comments: No Physical Exam: Telephone visit  Skin:    General: Skin is warm and dry.  Neurological:     Mental Status: She is alert and oriented to person, place, and time.  Psychiatric:        Mood and Affect: Mood normal.        Behavior: Behavior normal.         Assessment & Plan:  1.Lumbar Postlaminectomy/ Spinal Stenosis Lumbar Region/ Post-Op Pain/Low back pain with radiating symptoms in L3-4 distribution:  continues to be limited by pain. Refilled:  MSIR 15 mg one tablet twice a day as needed for moderate to sever pain #60 and  Morphine Sulfate ER 15 mg Q 8 hours. 06/24/2022. We will continue the opioid monitoring program, this consists of regular clinic visits, examinations, urine drug screen, pill counts as well as use of [place] Controlled Substance Reporting system. A 12 month History has been reviewed on the [place] Controlled Substance Reporting System on 06/24/2022. Encouraged to Continue exercise regime.   2. Lumbar Radiculopathy: No complaints today. Continue Topamax:.Continue to Monitor 06/24/2022 3.Chronic  Right Knee Pain: No complaints today.  Continue with Ice Therapy and  Voltaren Gel. 06/24/2022 4. Muscle Spasm: Continue current treatment: Tizanidine. 06/24/2022  5. Lipoma: S/P Left  Lipoma Excision of upper back  By Dr. Brantley Stage on 01/03/2018. Surgery Following. Continue to Monitor. 06/24/2022.   6. Paresthesia of Skin: Continue Topamax. Continue to monitor. 06/24/2022 7. Adhesive Capsulitis of Right Shoulder:Right Shoulder Tendonitis Pain:  No complaints today. S/P Right Shoulder Cortisone Injection with Dr Letta Pate, with good relief noted.  Continue to monitor. Continue HEP as Tolerated. 06/24/2022  8. Polyarthralgia: Continue HEP as Tolerated. Continue to Monitor.06/24/2022  9. Sacral Pain: No complaints today. Continue to Monitor. 06/24/2022   F/U in 1 month   Telephone Visit: My- Chart not functioning for patient Established Patient Location of Patient: In Her Home Location of Provider: In the Office  Total Time: 10 Minutes

## 2022-06-28 ENCOUNTER — Encounter: Payer: Self-pay | Admitting: Registered Nurse

## 2022-06-28 ENCOUNTER — Ambulatory Visit: Payer: Medicare Other | Admitting: Registered Nurse

## 2022-07-07 ENCOUNTER — Ambulatory Visit: Payer: Self-pay

## 2022-07-07 NOTE — Patient Outreach (Signed)
  Care Coordination   07/07/2022 Name: [name] [name] MRN: [id number] DOB: Dec 06, 1955   Care Coordination Outreach Attempts:  A third unsuccessful outreach was attempted today to offer the patient with information about available care coordination services as a benefit of their health plan.   Follow Up Plan:  No further outreach attempts will be made at this time. We have been unable to contact the patient to offer or enroll patient in care coordination services  Encounter Outcome:  No Answer   Care Coordination Interventions:  No, not indicated    Jone Baseman, RN, MSN [facility] Care Management Coordinator Direct Line [phone]

## 2022-07-18 ENCOUNTER — Other Ambulatory Visit: Payer: Self-pay | Admitting: Student

## 2022-07-18 DIAGNOSIS — I10 Essential (primary) hypertension: Secondary | ICD-10-CM

## 2022-07-21 ENCOUNTER — Other Ambulatory Visit: Payer: Self-pay

## 2022-07-21 ENCOUNTER — Encounter: Payer: Self-pay | Admitting: Registered Nurse

## 2022-07-21 ENCOUNTER — Encounter: Payer: 59 | Attending: Physical Medicine & Rehabilitation | Admitting: Registered Nurse

## 2022-07-21 VITALS — BP 157/87 | HR 60 | Ht 64.0 in | Wt 216.0 lb

## 2022-07-21 DIAGNOSIS — Z5181 Encounter for therapeutic drug level monitoring: Secondary | ICD-10-CM | POA: Insufficient documentation

## 2022-07-21 DIAGNOSIS — R001 Bradycardia, unspecified: Secondary | ICD-10-CM

## 2022-07-21 DIAGNOSIS — M4807 Spinal stenosis, lumbosacral region: Secondary | ICD-10-CM | POA: Diagnosis not present

## 2022-07-21 DIAGNOSIS — Z79891 Long term (current) use of opiate analgesic: Secondary | ICD-10-CM | POA: Diagnosis not present

## 2022-07-21 DIAGNOSIS — M961 Postlaminectomy syndrome, not elsewhere classified: Secondary | ICD-10-CM | POA: Diagnosis not present

## 2022-07-21 DIAGNOSIS — M79641 Pain in right hand: Secondary | ICD-10-CM | POA: Diagnosis not present

## 2022-07-21 MED ORDER — MORPHINE SULFATE ER 15 MG PO TBCR
15.0000 mg | EXTENDED_RELEASE_TABLET | Freq: Three times a day (TID) | ORAL | 0 refills | Status: DC
  Filled 2022-07-21: qty 90, 30d supply, fill #0

## 2022-07-21 MED ORDER — MORPHINE SULFATE 15 MG PO TABS
15.0000 mg | ORAL_TABLET | Freq: Two times a day (BID) | ORAL | 0 refills | Status: DC | PRN
  Filled 2022-07-21: qty 60, 30d supply, fill #0

## 2022-07-21 NOTE — Progress Notes (Signed)
Subjective:    Patient ID: [name] [name], female    DOB: Jun 26, 1955, 67 y.o.   MRN: [id number]  HPI: [name] [name] is a 67 y.o. female who returns for follow up appointment for chronic pain and medication refill. She states her pain is located in her right hand, she reports Ortho following, she has a scheduled appointment on 07/22/2022. She also reports lower back pain and left knee pain. She rates her pain 8. Her current exercise regime is walking and performing stretching exercises.  [name] [name] Morphine equivalent is 75.00 MME.   Last UDS was Performed on 03/30/2023, it was consistent.      Pain Inventory Average Pain 7 Pain Right Now 8 My pain is constant, sharp, and aching  In the last 24 hours, has pain interfered with the following? General activity 8 Relation with others 8 Enjoyment of life 8 What TIME of day is your pain at its worst? morning , evening, and night Sleep (in general) Poor  Pain is worse with: walking, inactivity, and standing Pain improves with: rest, heat/ice, and medication Relief from Meds: 1  Family History  Problem Relation Age of Onset   Hyperlipidemia Mother    Heart attack Mother    Heart disease Mother 51       CHF; CAD   Hypertension Mother    Heart failure Mother    Diabetes Sister    Lupus Sister    Kidney failure Sister    Hyperlipidemia Sister    Hypertension Sister    Stroke Maternal Aunt    Hypertension Brother    Colon cancer Neg Hx    Rectal cancer Neg Hx    Breast cancer Neg Hx    Social History   Socioeconomic History   Marital status: Widowed    Spouse name: Not on file   Number of children: 3   Years of education: Not on file   Highest education level: Not on file  Occupational History   Occupation: disability    Comment: 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  Tobacco Use   Smoking status: Former    Packs/day: 0.50    Years: 20.00    Total pack years: 10.00    Types: Cigarettes    Quit date: 07/21/2008    Years since  quitting: 14.0   Smokeless tobacco: Never  Vaping Use   Vaping Use: Never used  Substance and Sexual Activity   Alcohol use: No   Drug use: No   Sexual activity: Not on file  Other Topics Concern   Not on file  Social History Narrative   Marital status: widowed since 2002; not dating which is bad in 2018      Children: 3 children (45, 6, 40); 13 grandchildren; 1 gg      Employment: disability since 2009; retail      Lives: alone in [place] in apartment      Tobacco: quit in 2011      Alcohol: none      Drugs: none      Exercise: minimal in 2018      ADLs: independent with ADLs; drives      Seatbelt: 100%; no texting.   Social Determinants of Health   Financial Resource Strain: Not on file  Food Insecurity: No Food Insecurity (04/11/2022)   Hunger Vital Sign    Worried About Running Out of Food in the Last Year: Never true    Ran Out of Food in the Last Year: Never  true  Transportation Needs: No Transportation Needs (04/11/2022)   PRAPARE - Hydrologist (Medical): No    Lack of Transportation (Non-Medical): No  Physical Activity: Not on file  Stress: Not on file  Social Connections: Not on file   Past Surgical History:  Procedure Laterality Date   ABDOMINAL HYSTERECTOMY  1982   DUB; ovaries intact   bladder surg  1992   bladder retraction   BREAST BIOPSY     BREAST REDUCTION SURGERY  1984   CARDIAC CATHETERIZATION  [place]  05/11/11   Duke Lysis of adhesions Crohn's   MASS EXCISION Left 01/03/2018   Procedure: EXCISION LEFT UPPER BACK LIPOMA ERAS PATH;  Surgeon: Erroll Luna, MD;  Location: [facility];  Service: General;  Laterality: Left;   REDUCTION MAMMAPLASTY     SPINE SURGERY     5 total (1 upper, 4 lumbar)   SPINE SURGERY     nerve stimulator on right side   Past Surgical History:  Procedure Laterality Date   ABDOMINAL HYSTERECTOMY  1982   DUB; ovaries intact   bladder surg   1992   bladder retraction   BREAST BIOPSY     BREAST REDUCTION SURGERY  1984   CARDIAC CATHETERIZATION  1990's   [name]   COLON SURGERY  05/11/11   Duke Lysis of adhesions Crohn's   MASS EXCISION Left 01/03/2018   Procedure: EXCISION LEFT UPPER BACK LIPOMA ERAS PATH;  Surgeon: Erroll Luna, MD;  Location: [facility];  Service: General;  Laterality: Left;   REDUCTION MAMMAPLASTY     SPINE SURGERY     5 total (1 upper, 4 lumbar)   SPINE SURGERY     nerve stimulator on right side   Past Medical History:  Diagnosis Date   Anemia    Blood transfusion without reported diagnosis    Crohn's disease ([place])    Disorder of sacroiliac joint    Exertional dyspnea 05/28/2021   Hyperlipidemia    Hypertension    Ischemic colitis ([place])    Lumbago    Lumbar post-laminectomy syndrome    Lumbosacral neuritis    Lumbosacral radiculitis    OSA (obstructive sleep apnea) 05/28/2021   Sciatica    There were no vitals taken for this visit.  Opioid Risk Score:   Fall Risk Score:  `1  Depression screen PHQ 2/9     05/24/2022    8:16 AM 04/29/2022    9:32 AM 04/06/2022   11:50 AM 02/22/2022    1:48 PM 01/28/2022    8:31 AM 01/25/2022    9:27 AM 12/31/2021   10:56 AM  Depression screen PHQ 2/9  Decreased Interest 0 0 0 0 2 0 0  Down, Depressed, Hopeless 0 0 0 0 0 0 0  PHQ - 2 Score 0 0 0 0 2 0 0  Altered sleeping     1    Tired, decreased energy     3    Change in appetite     3    Feeling bad or failure about yourself      0    Trouble concentrating     1    Moving slowly or fidgety/restless     2    Suicidal thoughts     0    PHQ-9 Score     12      Review of Systems  Musculoskeletal:  Positive for back pain.       Right shoulder pain, right knee pain  All other systems reviewed and are negative.     Objective:   Physical Exam Vitals and nursing note reviewed.  Constitutional:      Appearance: Normal appearance.  Cardiovascular:     Rate and Rhythm: Normal  rate and regular rhythm.     Pulses: Normal pulses.     Heart sounds: Normal heart sounds.  Pulmonary:     Effort: Pulmonary effort is normal.     Breath sounds: Normal breath sounds.  Musculoskeletal:     Cervical back: Normal range of motion and neck supple.     Comments: Normal Muscle Bulk and Muscle Testing Reveals:  Upper Extremities: Full ROM and Muscle Strength 5/5  Lumbar Paraspinal Tenderness: L-4-L-5 Lower Extremities: Full ROM and Muscle Strength 5/5 Arises from Table slowly Antalgic  Gait     Skin:    General: Skin is warm and dry.  Neurological:     Mental Status: She is alert and oriented to person, place, and time.  Psychiatric:        Mood and Affect: Mood normal.        Behavior: Behavior normal.         Assessment & Plan:  1.Lumbar Postlaminectomy/ Spinal Stenosis Lumbar Region/ Post-Op Pain/Low back pain with radiating symptoms in L3-4 distribution:  continues to be limited by pain. Refilled:  MSIR 15 mg one tablet twice a day as needed for moderate to sever pain #60 and  Morphine Sulfate ER 15 mg Q 8 hours. 07/21/2022. We will continue the opioid monitoring program, this consists of regular clinic visits, examinations, urine drug screen, pill counts as well as use of [place] Controlled Substance Reporting system. A 12 month History has been reviewed on the [place] Controlled Substance Reporting System on 06/24/2022. Encouraged to Continue exercise regime.   2. Lumbar Radiculopathy: No complaints today. Continue Topamax:.Continue to Monitor 07/21/2022 3.Chronic  Right Knee Pain: No complaints today.  Continue with Ice Therapy and  Voltaren Gel. 07/21/2022 4. Muscle Spasm: Continue current treatment: Tizanidine. 07/21/2022  5. Lipoma: S/P Left  Lipoma Excision of upper back  By Dr. Brantley Stage on 01/03/2018. Surgery Following. Continue to Monitor. 07/21/2022.   6. Paresthesia of Skin: Continue Topamax. Continue to monitor. 07/21/2022 7. Adhesive  Capsulitis of Right Shoulder:Right Shoulder Tendonitis Pain:  No complaints today. S/P Right Shoulder Cortisone Injection with Dr Letta Pate, with good relief noted. Continue to monitor. Continue HEP as Tolerated. 07/21/2022  8. Polyarthralgia: Continue HEP as Tolerated. Continue to Monitor.07/21/2022  9. Sacral Pain: No complaints today. Continue to Monitor. 07/21/2022 10. Right Hand Pain: She has a schedu;led appointment with Ortho on 07/22/2022 11. Bradycardia: Apical Pulse Check. Cardiology Following. Continue to Monitor.  F/U in 1 month

## 2022-07-22 ENCOUNTER — Ambulatory Visit: Payer: Self-pay

## 2022-07-22 ENCOUNTER — Ambulatory Visit (INDEPENDENT_AMBULATORY_CARE_PROVIDER_SITE_OTHER): Payer: 59 | Admitting: Orthopaedic Surgery

## 2022-07-22 ENCOUNTER — Encounter: Payer: Self-pay | Admitting: Orthopaedic Surgery

## 2022-07-22 VITALS — BP 144/67 | HR 55 | Ht 64.0 in | Wt 216.0 lb

## 2022-07-22 DIAGNOSIS — M65331 Trigger finger, right middle finger: Secondary | ICD-10-CM

## 2022-07-22 DIAGNOSIS — G5601 Carpal tunnel syndrome, right upper limb: Secondary | ICD-10-CM | POA: Diagnosis not present

## 2022-07-22 DIAGNOSIS — M25562 Pain in left knee: Secondary | ICD-10-CM | POA: Diagnosis not present

## 2022-07-22 DIAGNOSIS — M2242 Chondromalacia patellae, left knee: Secondary | ICD-10-CM | POA: Insufficient documentation

## 2022-07-22 MED ORDER — LIDOCAINE HCL 1 % IJ SOLN
0.5000 mL | INTRAMUSCULAR | Status: AC | PRN
  Administered 2022-07-22: .5 mL

## 2022-07-22 MED ORDER — BUPIVACAINE HCL 0.5 % IJ SOLN
3.0000 mL | INTRAMUSCULAR | Status: AC | PRN
  Administered 2022-07-22: 3 mL via INTRA_ARTICULAR

## 2022-07-22 MED ORDER — METHYLPREDNISOLONE ACETATE 40 MG/ML IJ SUSP
40.0000 mg | INTRAMUSCULAR | Status: AC | PRN
  Administered 2022-07-22: 40 mg via INTRA_ARTICULAR

## 2022-07-22 NOTE — Progress Notes (Signed)
Office Visit Note   Patient: [name] [name]           Date of Birth: 1955/10/15           MRN: [id number] Visit Date: 07/22/2022              Requested by: Gerrit Heck, MD [address] [place],  [place] [address] PCP: Gerrit Heck, MD   Assessment & Plan: Visit Diagnoses:  1. Acute pain of left knee   2. Chondromalacia patellae, left knee   3. Carpal tunnel syndrome, right upper limb   4. Trigger finger, right middle finger     Plan: Patient failed conservative treatment including splinting with positive electrical test.  Good relief previously with left carpal tunnel release.  Left knee injection performed we will schedule for right carpal tunnel release and right long finger trigger finger release as an outpatient.  Follow-Up Instructions: No follow-ups on file.   Orders:  Orders Placed This Encounter  Procedures   Large Joint Inj: L knee   XR KNEE 3 VIEW LEFT   No orders of the defined types were placed in this encounter.     Procedures: Large Joint Inj: L knee on 07/22/2022 8:50 AM Indications: joint swelling and pain Details: 22 G 1.5 in needle, anterolateral approach  Arthrogram: No  Medications: 0.5 mL lidocaine 1 %; 3 mL bupivacaine 0.5 %; 40 mg methylPREDNISolone acetate 40 MG/ML Outcome: tolerated well, no immediate complications Procedure, treatment alternatives, risks and benefits explained, specific risks discussed. Consent was given by the patient. Immediately prior to procedure a time out was called to verify the correct patient, procedure, equipment, support staff and site/side marked as required. Patient was prepped and draped in the usual sterile fashion.       Clinical Data: No additional findings.   Subjective: Chief Complaint  Patient presents with   Right Hand - Pain   Left Knee - Pain    HPI 67 year old female returns with the pain and swelling and some catching in her left knee.  She also had increased problems with her  right carpal tunnel had previous electrical test that showed moderate carpal tunnel syndrome done 02/09/2022.  She has had left carpal tunnel release doing well still having sometimes triggering and left index finger.  She has had repetitive locking and catching of her right long finger and is able to demonstrate it today.  Hand wakes her up at night she has to shake her hand.  She finds her long finger locked in flexion when she wakes up and has painful reduction.  She has been doing some cooking and other household activities that bothers her hand states she wants to proceed with carpal tunnel release to get her trigger finger fixed.  Review of Systems previous lumbar multilevel fusion done in [place].  Previous cervical fusion.  Positive history of hypertension and prediabetes.   Objective: Vital Signs: BP (!) 144/67   Pulse (!) 55   Ht '5\' 4"'$  (1.626 m)   Wt 216 lb (98 kg)   BMI 37.08 kg/m   Physical Exam Constitutional:      Appearance: She is well-developed.  HENT:     Head: Normocephalic.     Right Ear: External ear normal.     Left Ear: External ear normal. There is no impacted cerumen.  Eyes:     Pupils: Pupils are equal, round, and reactive to light.  Neck:     Thyroid: No thyromegaly.  Trachea: No tracheal deviation.  Cardiovascular:     Rate and Rhythm: Normal rate.  Pulmonary:     Effort: Pulmonary effort is normal.  Abdominal:     Palpations: Abdomen is soft.  Musculoskeletal:     Cervical back: No rigidity.  Skin:    General: Skin is warm and dry.  Neurological:     Mental Status: She is alert and oriented to person, place, and time.  Psychiatric:        Behavior: Behavior normal.     Ortho Exam patient has 2+ left knee effusion.  More lateral medial joint line tenderness.  Adjacent to the patella with flexion extension there is some popping or thinking of synovium which is painful to her.  No patellar subluxation.  Some medial joint line tenderness both right  and left.  Collateral ligaments are stable ACL PCL exam is normal pulses are normal negative logroll to hips.  Positive right carpal compression test well-healed left carpal tunnel incision.  Right hand positive Phalen's.  No thenar atrophy.  She demonstrates locking of the long finger with tenderness over the A1 pulley.  Specialty Comments:   Impression: The above electrodiagnostic study is ABNORMAL and reveals evidence of:   a moderate to severe left median nerve entrapment at the wrist (carpal tunnel syndrome) affecting sensory and motor components.    a moderate right median nerve entrapment at the wrist (carpal tunnel syndrome) affecting sensory and motor components.    There is no significant electrodiagnostic evidence of any other focal nerve entrapment, brachial plexopathy or cervical radiculopathy.    Recommendations: 1.  Follow-up with referring physician. 2.  Continue current management of symptoms. 3.  Suggest surgical evaluation.   ___________________________ [name] Board Certified, American Board of Physical Medicine and Rehabilitation      Imaging: No results found.   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   Allergic rhinitis 04/15/2019   Chronic pain 04/15/2019   Memory difficulties 04/15/2019   Pre-diabetes 04/15/2019   Class 2 severe obesity due to excess calories with serious comorbidity and body mass index (BMI) of 36.0 to 36.9  in adult [facility]) 04/04/2017   Degeneration of lumbar intervertebral disc 11/28/2014   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/28/2014   History of lumbar fusion 11/28/2014   Hypokalemia 11/13/2014   Neuropathy 11/13/2014   Primary insomnia 11/13/2014   Postlaminectomy syndrome, cervical region 12/09/2013   Heart palpitations 10/19/2012   Dizziness 04/22/2012   Atypical chest pain 04/09/2012   Hyperlipemia 04/09/2012   Cervical pain 01/26/2012   Postlaminectomy syndrome, lumbar region 08/11/2011   Crohn's disease ([place])    Essential hypertension 09/02/2008   Past Medical History:  Diagnosis Date   Anemia    Blood transfusion without reported diagnosis    Crohn's disease ([place])    Disorder of sacroiliac joint    Exertional dyspnea 05/28/2021   Hyperlipidemia    Hypertension    Ischemic colitis ([place])    Lumbago  Lumbar post-laminectomy syndrome    Lumbosacral neuritis    Lumbosacral radiculitis    OSA (obstructive sleep apnea) 05/28/2021   Sciatica     Family History  Problem Relation Age of Onset   Hyperlipidemia Mother    Heart attack Mother    Heart disease Mother 29       CHF; CAD   Hypertension Mother    Heart failure Mother    Diabetes Sister    Lupus Sister    Kidney failure Sister    Hyperlipidemia Sister    Hypertension Sister    Stroke Maternal Aunt    Hypertension Brother    Colon cancer Neg Hx    Rectal cancer Neg Hx    Breast cancer Neg Hx     Past Surgical History:  Procedure Laterality Date   ABDOMINAL HYSTERECTOMY  1982   DUB; ovaries intact   bladder surg  1992   bladder retraction   BREAST BIOPSY     BREAST REDUCTION SURGERY  1984   CARDIAC CATHETERIZATION  1990's   [name]   COLON SURGERY  05/11/11   Duke Lysis of adhesions Crohn's   MASS EXCISION Left 01/03/2018   Procedure: EXCISION LEFT UPPER BACK LIPOMA ERAS PATH;  Surgeon: Erroll Luna, MD;  Location: [facility];  Service: General;  Laterality:  Left;   REDUCTION MAMMAPLASTY     SPINE SURGERY     5 total (1 upper, 4 lumbar)   SPINE SURGERY     nerve stimulator on right side   Social History   Occupational History   Occupation: disability    Comment: 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  Tobacco Use   Smoking status: Former    Packs/day: 0.50    Years: 20.00    Total pack years: 10.00    Types: Cigarettes    Quit date: 07/21/2008    Years since quitting: 14.0   Smokeless tobacco: Never  Vaping Use   Vaping Use: Never used  Substance and Sexual Activity   Alcohol use: No   Drug use: No   Sexual activity: Not on file

## 2022-07-27 ENCOUNTER — Telehealth: Payer: Self-pay

## 2022-07-27 NOTE — Telephone Encounter (Signed)
Patient wanted to let you know her surgery for her hand regarding carpal tunnel is scheduled for 08/06/22 w/ Rodell Perna at 10am.

## 2022-07-27 NOTE — Progress Notes (Signed)
SUBJECTIVE:   CHIEF COMPLAINT / HPI:   Here for annual visit  Fatigue progressing over the past few weeks -often only sleeps 2-3hrs/night, usually sleeps around 730 or 8 PM -usually falls asleep fine, but often wakes up and can't fall back asleep. Denies struggling to breathe (endorses snoring; states she has had a sleep study and tried CPAP which made her sleep worse) -Denies stressors, feelings of guilt or hopelessness -Feels that her chronic pain is contributing (has had multiple surgeries) - follows w/ PMR for this - on MS contin -Denies significant dizziness, syncope  CT chest obtained in 11/2021, recommended repeat scan in 6 mo -no smoking since 2011  Pre-diabetes -last A1c 6.1 -diet controlled -- usually one meal/day, mix of veggies/carbs/protein -Denies urinary frequency   HTN -On metoprolol succinate 12.102m qd, chlorthalidone 235mqd -Home BP runs in the 13Q000111Qystolic over 70Q000111Qiastolic typically  Bradycardia -follows w/ cardiology, denies significant dizziness/syncope, palpitations   PERTINENT  PMH / PSH:   Past Medical History:  Diagnosis Date   Anemia    Blood transfusion without reported diagnosis    [place]'s [place]   Disorder of sacroiliac joint    Exertional dyspnea 05/28/2021   Hyperlipidemia    Hypertension    [place]ic [place]   Lumbago    Lumbar post-laminectomy syndrome    Lumbosacral neuritis    Lumbosacral radiculitis    OSA (obstructive sleep apnea) 05/28/2021   Sciatica     OBJECTIVE:  BP (!) 152/65   Pulse (!) 50   Ht 5' 4"$  (1.626 m)   Wt 223 lb 9.6 oz (101.4 kg)   SpO2 99%   BMI 38.38 kg/m   General: NAD, pleasant, able to participate in exam Cardiac: RRR, no murmurs auscultated Respiratory: CTAB, normal WOB Abdomen: soft, non-tender, non-distended, normoactive bowel sounds Extremities: warm and well perfused, no edema or cyanosis Skin: warm and dry, no rashes noted Neuro: alert, no obvious focal deficits, speech  normal Psych: Normal affect and mood  ASSESSMENT/PLAN:   Fatigue, unspecified type Assessment & Plan: Suspect that most of her symptoms are due to poor sleep, likely multifactorial in the setting of chronic pain and environmental stimuli at night.  I hesitate to medicate her due to her age and chronic pain regimen.  Suggested trying 5 mg melatonin nightly, also suggested trying to sleep later in the evening to help stay asleep through the night.  Will also check CBC and TSH to evaluate for reversible causes of fatigue.  Orders: -     CBC -     TSH Rfx on Abnormal to Free T4  Pre-diabetes Assessment & Plan: Last A1c 6.1, will recheck today.  Diet controlled  Orders: -     Hemoglobin A1c  Lung nodules Assessment & Plan: Lung nodules noted on CT scan in June 2023, likely benign findings but recommended 6-3-monthllow-up scan.  Ordered and scheduled this for 08/05/2022  Orders: -     CT CHEST LCS NODULE F/U LOW DOSE WO CONTRAST; Future  Osteopenia determined by x-ray Assessment & Plan: Bone density scan completed in June 2023, found to have osteopenia.  Did not have vitamin D level checked, will check this today and supplement as appropriate  Orders: -     Vitamin D, 25-Hydroxy, Total  Bradycardia Assessment & Plan: Stable, on low-dose metoprolol, follows with cardiology   Essential hypertension Assessment & Plan: Stable, continue current regimen of chlorthalidone and metoprolol.  Suspect elevation in office today  due to [facility]    Provided advance directive form, patient will review this on her own - may f/u at next visit to see if she has questions  No orders of the defined types were placed in this encounter.  Return in about 6 months (around 01/26/2023) for prediabetes, sleep/fatigue.  August Albino, MD [facility]

## 2022-07-28 ENCOUNTER — Ambulatory Visit (INDEPENDENT_AMBULATORY_CARE_PROVIDER_SITE_OTHER): Payer: 59 | Admitting: Family Medicine

## 2022-07-28 ENCOUNTER — Encounter: Payer: Self-pay | Admitting: Family Medicine

## 2022-07-28 VITALS — BP 152/65 | HR 50 | Ht 64.0 in | Wt 223.6 lb

## 2022-07-28 DIAGNOSIS — R5383 Other fatigue: Secondary | ICD-10-CM

## 2022-07-28 DIAGNOSIS — R001 Bradycardia, unspecified: Secondary | ICD-10-CM | POA: Diagnosis not present

## 2022-07-28 DIAGNOSIS — I10 Essential (primary) hypertension: Secondary | ICD-10-CM | POA: Diagnosis not present

## 2022-07-28 DIAGNOSIS — Z23 Encounter for immunization: Secondary | ICD-10-CM | POA: Diagnosis not present

## 2022-07-28 DIAGNOSIS — R918 Other nonspecific abnormal finding of lung field: Secondary | ICD-10-CM | POA: Diagnosis not present

## 2022-07-28 DIAGNOSIS — R7303 Prediabetes: Secondary | ICD-10-CM

## 2022-07-28 DIAGNOSIS — M8580 Other specified disorders of bone density and structure, unspecified site: Secondary | ICD-10-CM | POA: Diagnosis not present

## 2022-07-28 NOTE — Assessment & Plan Note (Signed)
Stable, on low-dose metoprolol, follows with cardiology

## 2022-07-28 NOTE — Assessment & Plan Note (Signed)
Suspect that most of her symptoms are due to poor sleep, likely multifactorial in the setting of chronic pain and environmental stimuli at night.  I hesitate to medicate her due to her age and chronic pain regimen.  Suggested trying 5 mg melatonin nightly, also suggested trying to sleep later in the evening to help stay asleep through the night.  Will also check CBC and TSH to evaluate for reversible causes of fatigue.

## 2022-07-28 NOTE — Patient Instructions (Addendum)
It was wonderful to see you today.  Please bring ALL of your medications with you to every visit.   Updates from today's visit:  Please try taking 5 mg of melatonin 1 to 2 hours before sleeping, you can find this over-the-counter  You can also try sleeping later at night so that you don't wake up as early  I am checking some labs today, I will give you a call if anything is abnormal.  Otherwise, I will send you a message on MyChart  Please follow up in 6 months or sooner as needed  Thank you for choosing [facility].   Please call [phone] with any questions about today's appointment.  Please be sure to schedule follow up at the front  desk before you leave today.   August Albino, MD  Family Medicine

## 2022-07-28 NOTE — Assessment & Plan Note (Signed)
Lung nodules noted on CT scan in June 2023, likely benign findings but recommended 24-monthfollow-up scan.  Ordered and scheduled this for 08/05/2022

## 2022-07-28 NOTE — Assessment & Plan Note (Signed)
Stable, continue current regimen of chlorthalidone and metoprolol.  Suspect elevation in office today due to [facility]

## 2022-07-28 NOTE — Assessment & Plan Note (Signed)
Last A1c 6.1, will recheck today.  Diet controlled

## 2022-07-28 NOTE — Assessment & Plan Note (Addendum)
Bone density scan completed in June 2023, found to have osteopenia.  Did not have vitamin D level checked, will check this today and supplement as appropriate

## 2022-08-03 LAB — CBC
Hematocrit: 39.5 % (ref 34.0–46.6)
Hemoglobin: 12.9 g/dL (ref 11.1–15.9)
MCH: 27.1 pg (ref 26.6–33.0)
MCHC: 32.7 g/dL (ref 31.5–35.7)
MCV: 83 fL (ref 79–97)
Platelets: 282 10*3/uL (ref 150–450)
RBC: 4.76 x10E6/uL (ref 3.77–5.28)
RDW: 12.4 % (ref 11.7–15.4)
WBC: 5.6 10*3/uL (ref 3.4–10.8)

## 2022-08-03 LAB — HEMOGLOBIN A1C
Est. average glucose Bld gHb Est-mCnc: 154 mg/dL
Hgb A1c MFr Bld: 7 % — ABNORMAL HIGH (ref 4.8–5.6)

## 2022-08-03 LAB — TSH RFX ON ABNORMAL TO FREE T4: TSH: 1.51 u[IU]/mL (ref 0.450–4.500)

## 2022-08-03 LAB — VITAMIN D, 25-HYDROXY, TOTAL: Vitamin D, 25-Hydroxy, Serum: 6.7 ng/mL — ABNORMAL LOW

## 2022-08-04 ENCOUNTER — Telehealth: Payer: Self-pay | Admitting: Family Medicine

## 2022-08-04 ENCOUNTER — Other Ambulatory Visit: Payer: Self-pay

## 2022-08-04 MED ORDER — VITAMIN D (ERGOCALCIFEROL) 1.25 MG (50000 UNIT) PO CAPS
50000.0000 [IU] | ORAL_CAPSULE | ORAL | 0 refills | Status: DC
  Filled 2022-08-04: qty 8, 56d supply, fill #0

## 2022-08-04 NOTE — Telephone Encounter (Signed)
Called to discuss lab results.  For Vitamin D deficiency, will prescribe 50,000 units weekly for the next 8 weeks. Also encouraged OTC calcium/vitD supplementation. Scheduled her for an appointment in April to follow-up and likely go to daily dosing at that time  A1c 7, patient prefers to continue with diet modifications and discuss medications at next visit.  She may benefit from switching to ACE/ARB +/- SGLT2, may consider this at next visit

## 2022-08-05 ENCOUNTER — Other Ambulatory Visit: Payer: Self-pay

## 2022-08-05 ENCOUNTER — Ambulatory Visit (HOSPITAL_COMMUNITY)
Admission: RE | Admit: 2022-08-05 | Discharge: 2022-08-05 | Disposition: A | Discharge status: Home / Self Care | Payer: 59 | Source: Ambulatory Visit | Attending: Family Medicine | Admitting: Family Medicine

## 2022-08-05 DIAGNOSIS — R918 Other nonspecific abnormal finding of lung field: Secondary | ICD-10-CM | POA: Insufficient documentation

## 2022-08-05 DIAGNOSIS — Z122 Encounter for screening for malignant neoplasm of respiratory organs: Secondary | ICD-10-CM | POA: Diagnosis not present

## 2022-08-05 DIAGNOSIS — E7800 Pure hypercholesterolemia, unspecified: Secondary | ICD-10-CM | POA: Diagnosis not present

## 2022-08-05 MED ORDER — ATORVASTATIN CALCIUM 40 MG PO TABS
40.0000 mg | ORAL_TABLET | Freq: Every day | ORAL | 2 refills | Status: DC
  Filled 2022-08-05: qty 90, 90d supply, fill #0

## 2022-08-06 LAB — LIPID PANEL
Chol/HDL Ratio: 2.6 ratio (ref 0.0–4.4)
Cholesterol, Total: 153 mg/dL (ref 100–199)
HDL: 59 mg/dL (ref >39)
LDL Chol Calc (NIH): 81 mg/dL (ref 0–99)
Triglycerides: 67 mg/dL (ref 0–149)
VLDL Cholesterol Cal: 13 mg/dL (ref 5–40)

## 2022-08-06 LAB — HEPATIC FUNCTION PANEL
ALT: 20 IU/L (ref 0–32)
AST: 25 IU/L (ref 0–40)
Albumin: 4.6 g/dL (ref 3.9–4.9)
Alkaline Phosphatase: 80 IU/L (ref 44–121)
Bilirubin Total: 1 mg/dL (ref 0.0–1.2)
Bilirubin, Direct: 0.24 mg/dL (ref 0.00–0.40)
Total Protein: 7.5 g/dL (ref 6.0–8.5)

## 2022-08-08 ENCOUNTER — Telehealth: Payer: Self-pay

## 2022-08-08 ENCOUNTER — Other Ambulatory Visit: Payer: Self-pay | Admitting: Physical Medicine & Rehabilitation

## 2022-08-08 ENCOUNTER — Telehealth (HOSPITAL_BASED_OUTPATIENT_CLINIC_OR_DEPARTMENT_OTHER): Payer: Self-pay

## 2022-08-08 DIAGNOSIS — E7800 Pure hypercholesterolemia, unspecified: Secondary | ICD-10-CM

## 2022-08-08 DIAGNOSIS — G5601 Carpal tunnel syndrome, right upper limb: Secondary | ICD-10-CM | POA: Diagnosis not present

## 2022-08-08 DIAGNOSIS — M65331 Trigger finger, right middle finger: Secondary | ICD-10-CM | POA: Diagnosis not present

## 2022-08-08 MED ORDER — OXYCODONE HCL 10 MG PO TABS: 10.0000 mg | ORAL_TABLET | Freq: Four times a day (QID) | ORAL | 0 refills | Status: AC | PRN

## 2022-08-08 MED ORDER — ATORVASTATIN CALCIUM 80 MG PO TABS
80.0000 mg | ORAL_TABLET | Freq: Every day | ORAL | 3 refills | Status: DC
  Filled 2022-11-01: qty 90, 90d supply, fill #0
  Filled 2023-02-08: qty 90, 90d supply, fill #1
  Filled 2023-05-10: qty 90, 90d supply, fill #2

## 2022-08-08 NOTE — Telephone Encounter (Signed)
Called pt to advise

## 2022-08-08 NOTE — Telephone Encounter (Addendum)
Seen by patient [name] [name] on 08/06/2022  4:55 PM; New rx to pharmacy on file, labs ordered and mailed to patient. Follow up mychart message sent to patient.    ----- Message from Loel Dubonnet, NP sent at 08/06/2022  2:13 PM EST ----- Normal liver enzymes. Cholesterol much improved from previous value of 123 on lower dose Atorvastatin but not yet at goal of <70. Recommend increase Atorvastatin to 8m daily with repeat FLP/LFT in 3 months.

## 2022-08-08 NOTE — Telephone Encounter (Signed)
Ms. Lebron Conners had surgery on her hand today and Dr Lorin Mercy did her surgery and will not give her any medicine since she is being seen here and will like to know if adjustments can be made to her original medication.

## 2022-08-16 ENCOUNTER — Ambulatory Visit (INDEPENDENT_AMBULATORY_CARE_PROVIDER_SITE_OTHER): Payer: 59 | Admitting: Orthopaedic Surgery

## 2022-08-16 ENCOUNTER — Encounter: Payer: Self-pay | Admitting: Orthopaedic Surgery

## 2022-08-16 VITALS — BP 136/69 | HR 64 | Ht 64.0 in | Wt 223.0 lb

## 2022-08-16 DIAGNOSIS — G5601 Carpal tunnel syndrome, right upper limb: Secondary | ICD-10-CM | POA: Diagnosis not present

## 2022-08-16 DIAGNOSIS — M65331 Trigger finger, right middle finger: Secondary | ICD-10-CM

## 2022-08-16 NOTE — Progress Notes (Signed)
Post-Op Visit Note   Patient: [name] [name]           Date of Birth: June 09, 1956           MRN: [id number] Visit Date: 08/16/2022 PCP: Gerrit Heck, MD   Assessment & Plan: Post right middle finger trigger finger release carpal tunnel release.  Still has some numbness of the long finger both sides.  Carpal tunnel incision looks good.  She still has some partial triggering we will place her in a dorsal DIP splint.  Opposite index finger also has some partial triggering on the left hand.  Sutures harvested from trigger finger.  Will leave the carpal tunnel sutures until next week.  Wrist splint applied.  Recheck 1 week.  Chief Complaint:  Chief Complaint  Patient presents with   Right Hand - Routine Post Op    08/08/2022 Right CTR, Right long finger trigger finger release   Visit Diagnoses:  1. Trigger finger, right middle finger   2. Carpal tunnel syndrome, right upper limb     Plan: Return in 1 week.  Follow-Up Instructions: Return in about 1 week (around 08/23/2022).   Orders:  No orders of the defined types were placed in this encounter.  No orders of the defined types were placed in this encounter.   Imaging: No results found.  PMFS History: Patient Active Problem List   Diagnosis Date Noted   Fatigue 07/28/2022   Osteopenia determined by x-ray 07/28/2022   Chondromalacia patellae, left knee 07/22/2022   Carpal tunnel syndrome, right upper limb 07/22/2022   Carpal tunnel syndrome, left upper limb 02/25/2022   Hx of fusion of cervical spine 12/14/2021   Cervical radiculopathy 11/09/2021   Trigger finger, right middle finger 11/09/2021   Lung nodules 11/09/2021   OSA (obstructive sleep apnea) 05/28/2021   Exertional dyspnea 05/28/2021   Bradycardia 12/27/2020   Lower extremity edema 11/10/2019   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   Allergic rhinitis 04/15/2019   Chronic pain 04/15/2019   Memory difficulties 04/15/2019   Pre-diabetes 04/15/2019    Class 2 severe obesity due to excess calories with serious comorbidity and body mass index (BMI) of 36.0 to 36.9 in adult [facility]) 04/04/2017   Degeneration of lumbar intervertebral disc 11/28/2014   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/28/2014   History of lumbar fusion 11/28/2014   Hypokalemia 11/13/2014   Neuropathy 11/13/2014   Primary insomnia 11/13/2014   Postlaminectomy syndrome, cervical region 12/09/2013   Heart palpitations 10/19/2012   Dizziness 04/22/2012   Atypical chest pain 04/09/2012   Hyperlipemia 04/09/2012   Cervical pain 01/26/2012   Postlaminectomy syndrome, lumbar region 08/11/2011   Crohn's disease ([place])    Essential hypertension 09/02/2008   Past Medical History:  Diagnosis Date   Anemia    Blood transfusion without reported diagnosis    Crohn's disease ([place])    Disorder of sacroiliac joint    Exertional dyspnea 05/28/2021   Hyperlipidemia    Hypertension    Ischemic colitis ([place])    Lumbago    Lumbar post-laminectomy syndrome    Lumbosacral neuritis    Lumbosacral radiculitis    OSA (obstructive sleep apnea) 05/28/2021   Sciatica     Family History  Problem Relation Age of Onset   Hyperlipidemia Mother    Heart attack Mother    Heart disease Mother 32       CHF; CAD   Hypertension Mother    Heart failure Mother    Diabetes  Sister    Lupus Sister    Kidney failure Sister    Hyperlipidemia Sister    Hypertension Sister    Stroke Maternal Aunt    Hypertension Brother    Colon cancer Neg Hx    Rectal cancer Neg Hx    Breast cancer Neg Hx     Past Surgical History:  Procedure Laterality Date   ABDOMINAL HYSTERECTOMY  1982   DUB; ovaries intact   bladder surg  1992   bladder retraction   BREAST BIOPSY     BREAST REDUCTION SURGERY  1984   CARDIAC CATHETERIZATION  25's   [name]   COLON SURGERY  05/11/11   [facility] Lysis of adhesions Crohn's   MASS EXCISION Left 01/03/2018   Procedure: EXCISION LEFT UPPER BACK  LIPOMA ERAS PATH;  Surgeon: Erroll Luna, MD;  Location: [place];  Service: General;  Laterality: Left;   REDUCTION MAMMAPLASTY     SPINE SURGERY     5 total (1 upper, 4 lumbar)   SPINE SURGERY     nerve stimulator on right side   Social History   Occupational History   Occupation: disability    Comment: 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  Tobacco Use   Smoking status: Former    Packs/day: 0.50    Years: 20.00    Total pack years: 10.00    Types: Cigarettes    Quit date: 07/21/2008    Years since quitting: 14.0   Smokeless tobacco: Never  Vaping Use   Vaping Use: Never used  Substance and Sexual Activity   Alcohol use: No   Drug use: No   Sexual activity: Not on file

## 2022-08-19 ENCOUNTER — Other Ambulatory Visit: Payer: Self-pay

## 2022-08-19 ENCOUNTER — Encounter: Payer: 59 | Attending: Physical Medicine & Rehabilitation | Admitting: Registered Nurse

## 2022-08-19 ENCOUNTER — Encounter: Payer: Self-pay | Admitting: Registered Nurse

## 2022-08-19 VITALS — BP 130/76 | HR 64 | Ht 64.0 in | Wt 226.4 lb

## 2022-08-19 DIAGNOSIS — R001 Bradycardia, unspecified: Secondary | ICD-10-CM | POA: Insufficient documentation

## 2022-08-19 DIAGNOSIS — Z79891 Long term (current) use of opiate analgesic: Secondary | ICD-10-CM | POA: Diagnosis not present

## 2022-08-19 DIAGNOSIS — Z5181 Encounter for therapeutic drug level monitoring: Secondary | ICD-10-CM | POA: Insufficient documentation

## 2022-08-19 DIAGNOSIS — G894 Chronic pain syndrome: Secondary | ICD-10-CM | POA: Insufficient documentation

## 2022-08-19 DIAGNOSIS — M4807 Spinal stenosis, lumbosacral region: Secondary | ICD-10-CM | POA: Insufficient documentation

## 2022-08-19 DIAGNOSIS — M961 Postlaminectomy syndrome, not elsewhere classified: Secondary | ICD-10-CM | POA: Diagnosis not present

## 2022-08-19 MED ORDER — MORPHINE SULFATE ER 15 MG PO TBCR
15.0000 mg | EXTENDED_RELEASE_TABLET | Freq: Three times a day (TID) | ORAL | 0 refills | Status: DC
  Filled 2022-08-19: qty 90, 30d supply, fill #0

## 2022-08-19 MED ORDER — MORPHINE SULFATE 15 MG PO TABS
15.0000 mg | ORAL_TABLET | Freq: Two times a day (BID) | ORAL | 0 refills | Status: DC | PRN
  Filled 2022-08-19: qty 60, 30d supply, fill #0

## 2022-08-19 NOTE — Progress Notes (Signed)
Subjective:    Patient ID: [name] [name], female    DOB: [birth year], 67 y.o.   MRN: [id number]  HPI: [name] [name] is a 67 y.o. female who returns for follow up appointment for chronic pain and medication refill. She states her pain is located in her lower back. She rates her pain 8. Her current exercise regime is walking and performing stretching exercises.  [name] [name] Morphine equivalent is 75.00 MME.   UDS ordered today.     Pain Inventory Average Pain 8 Pain Right Now 8 My pain is constant, sharp, and aching  In the last 24 hours, has pain interfered with the following? General activity 8 Relation with others 8 Enjoyment of life 8 What TIME of day is your pain at its worst? morning , daytime, and night Sleep (in general) Poor  Pain is worse with: walking, inactivity, and standing Pain improves with: rest, heat/ice, and medication Relief from Meds: 2  Family History  Problem Relation Age of Onset   Hyperlipidemia Mother    Heart attack Mother    Heart disease Mother 51       CHF; CAD   Hypertension Mother    Heart failure Mother    Diabetes Sister    Lupus Sister    Kidney failure Sister    Hyperlipidemia Sister    Hypertension Sister    Stroke Maternal Aunt    Hypertension Brother    Colon cancer Neg Hx    Rectal cancer Neg Hx    Breast cancer Neg Hx    Social History   Socioeconomic History   Marital status: Widowed    Spouse name: Not on file   Number of children: 3   Years of education: Not on file   Highest education level: Not on file  Occupational History   Occupation: disability    Comment: 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  Tobacco Use   Smoking status: Former    Packs/day: 0.50    Years: 20.00    Total pack years: 10.00    Types: Cigarettes    Quit date: 07/21/2008    Years since quitting: 14.0   Smokeless tobacco: Never  Vaping Use   Vaping Use: Never used  Substance and Sexual Activity   Alcohol use: No   Drug use: No   Sexual activity:  Not on file  Other Topics Concern   Not on file  Social History Narrative   Marital status: widowed since 2002; not dating which is bad in 2018      Children: 3 children (45, 45, 40); 13 grandchildren; 1 gg      Employment: disability since 2009; retail      Lives: alone in [place] in apartment      Tobacco: quit in 2011      Alcohol: none      Drugs: none      Exercise: minimal in 2018      ADLs: independent with ADLs; drives      Seatbelt: 100%; no texting.   Social Determinants of Health   Financial Resource Strain: Not on file  Food Insecurity: No Food Insecurity (04/11/2022)   Hunger Vital Sign    Worried About Running Out of Food in the Last Year: Never true    Ran Out of Food in the Last Year: Never true  Transportation Needs: No Transportation Needs (04/11/2022)   PRAPARE - Hydrologist (Medical): No    Lack of Transportation (Non-Medical):  No  Physical Activity: Not on file  Stress: Not on file  Social Connections: Not on file   Past Surgical History:  Procedure Laterality Date   ABDOMINAL HYSTERECTOMY  1982   DUB; ovaries intact   bladder surg  1992   bladder retraction   BREAST BIOPSY     BREAST REDUCTION SURGERY  1984   CARDIAC CATHETERIZATION  [place]  05/11/11   [facility] Lysis of adhesions Crohn's   MASS EXCISION Left 01/03/2018   Procedure: EXCISION LEFT UPPER BACK LIPOMA ERAS PATH;  Surgeon: Erroll Luna, MD;  Location: [place];  Service: General;  Laterality: Left;   REDUCTION MAMMAPLASTY     SPINE SURGERY     5 total (1 upper, 4 lumbar)   SPINE SURGERY     nerve stimulator on right side   Past Surgical History:  Procedure Laterality Date   ABDOMINAL HYSTERECTOMY  1982   DUB; ovaries intact   bladder surg  1992   bladder retraction   BREAST BIOPSY     BREAST REDUCTION SURGERY  1984   CARDIAC CATHETERIZATION  1990's   [name]   COLON SURGERY  05/11/11   [facility]  Lysis of adhesions Crohn's   MASS EXCISION Left 01/03/2018   Procedure: EXCISION LEFT UPPER BACK LIPOMA ERAS PATH;  Surgeon: Erroll Luna, MD;  Location: [place];  Service: General;  Laterality: Left;   REDUCTION MAMMAPLASTY     SPINE SURGERY     5 total (1 upper, 4 lumbar)   SPINE SURGERY     nerve stimulator on right side   Past Medical History:  Diagnosis Date   Anemia    Blood transfusion without reported diagnosis    Crohn's disease ([place])    Disorder of sacroiliac joint    Exertional dyspnea 05/28/2021   Hyperlipidemia    Hypertension    Ischemic colitis ([place])    Lumbago    Lumbar post-laminectomy syndrome    Lumbosacral neuritis    Lumbosacral radiculitis    OSA (obstructive sleep apnea) 05/28/2021   Sciatica    BP (!) 162/75   Pulse 64   Ht '5\' 4"'$  (1.626 m)   Wt 226 lb 6.4 oz (102.7 kg)   SpO2 98%   BMI 38.86 kg/m   Opioid Risk Score:   Fall Risk Score:  `1  Depression screen [facility] 2/9     08/19/2022    8:41 AM 07/28/2022    9:22 AM 07/21/2022    9:44 AM 05/24/2022    8:16 AM 04/29/2022    9:32 AM 04/06/2022   11:50 AM 02/22/2022    1:48 PM  Depression screen PHQ 2/9  Decreased Interest 0 1 0 0 0 0 0  Down, Depressed, Hopeless 0 0 0 0 0 0 0  PHQ - 2 Score 0 1 0 0 0 0 0  Altered sleeping  1       Tired, decreased energy  1       Change in appetite  2       Feeling bad or failure about yourself   0       Trouble concentrating  0       Moving slowly or fidgety/restless  0       Suicidal thoughts  0       PHQ-9 Score  5       Difficult doing work/chores  Somewhat difficult  Review of Systems  Constitutional: Negative.   HENT: Negative.    Eyes: Negative.   Respiratory: Negative.    Cardiovascular: Negative.   Gastrointestinal: Negative.   Endocrine: Negative.   Genitourinary: Negative.   Musculoskeletal:  Positive for arthralgias, back pain and gait problem.  Skin: Negative.   Allergic/Immunologic: Negative.   Hematological:  Negative.   Psychiatric/Behavioral: Negative.    All other systems reviewed and are negative.      Objective:   Physical Exam Vitals and nursing note reviewed.  Constitutional:      Appearance: Normal appearance.  Cardiovascular:     Rate and Rhythm: Normal rate and regular rhythm.     Pulses: Normal pulses.     Heart sounds: Normal heart sounds.  Pulmonary:     Effort: Pulmonary effort is normal.     Breath sounds: Normal breath sounds.  Musculoskeletal:     Cervical back: Normal range of motion and neck supple.     Comments: Normal Muscle Bulk and Muscle Testing Reveals:  Upper Extremities: Right: Upper Extremity: Decreased ROM 90 Degrees  and Muscle Strength 5/5 Right Wrist Splint  Left Upper Extremity: Full ROM and Muscle Strength 5/5  Lumbar Paraspinal Tenderness: L-4-L-5 Lower Extremities: Full ROM and Muscle Strength 5/5 Arises from Table slowly Narrow Based Gait     Skin:    General: Skin is warm and dry.  Neurological:     Mental Status: She is alert and oriented to person, place, and time.  Psychiatric:        Mood and Affect: Mood normal.        Behavior: Behavior normal.         Assessment & Plan:  1.Lumbar Postlaminectomy/ Spinal Stenosis Lumbar Region/ Post-Op Pain/Low back pain with radiating symptoms in L3-4 distribution:  continues to be limited by pain. Refilled:  MSIR 15 mg one tablet twice a day as needed for moderate to sever pain #60 and  Morphine Sulfate ER 15 mg Q 8 hours. 08/19/2022. We will continue the opioid monitoring program, this consists of regular clinic visits, examinations, urine drug screen, pill counts as well as use of [place] Controlled Substance Reporting system. A 12 month History has been reviewed on the [place] Controlled Substance Reporting System on 06/24/2022. Encouraged to Continue exercise regime.   2. Lumbar Radiculopathy: No complaints today. Continue Topamax:.Continue to Monitor 08/19/2022 3.Chronic  Right  Knee Pain: No complaints today.  Continue with Ice Therapy and  Voltaren Gel. 08/19/2022 4. Muscle Spasm: Continue current treatment: Tizanidine. 08/19/2022  5. Lipoma: S/P Left  Lipoma Excision of upper back  By Dr. Brantley Stage on 01/03/2018. Surgery Following. Continue to Monitor. 08/19/2022.   6. Paresthesia of Skin: Continue Topamax. Continue to monitor. 08/19/2022 7. Adhesive Capsulitis of Right Shoulder:Right Shoulder Tendonitis Pain:  No complaints today. S/P Right Shoulder Cortisone Injection with Dr Letta Pate, with good relief noted. Continue to monitor. Continue HEP as Tolerated. 08/19/2022  8. Polyarthralgia: Continue HEP as Tolerated. Continue to Monitor.08/19/2022  9. Sacral Pain: No complaints today. Continue to Monitor. 08/19/2022 10. Right Hand Pain: S/P :Post right middle finger trigger finger release carpal tunnel release. Dr Lorin Mercy Following.   Ortho on 08/19/2022 11. Essential Hypertension: She is compliant with her antihypertensive medication. Blood Pressure was re-checked. Continue to monitor.   F/U in 1 month

## 2022-08-22 ENCOUNTER — Other Ambulatory Visit: Payer: Self-pay

## 2022-08-23 ENCOUNTER — Other Ambulatory Visit: Payer: Self-pay

## 2022-08-24 ENCOUNTER — Ambulatory Visit (INDEPENDENT_AMBULATORY_CARE_PROVIDER_SITE_OTHER): Payer: 59 | Admitting: Orthopaedic Surgery

## 2022-08-24 ENCOUNTER — Telehealth: Payer: Self-pay | Admitting: *Deleted

## 2022-08-24 ENCOUNTER — Encounter: Payer: Self-pay | Admitting: Orthopaedic Surgery

## 2022-08-24 VITALS — BP 152/75 | HR 52 | Temp 98.3°F | Ht 64.0 in | Wt 226.0 lb

## 2022-08-24 DIAGNOSIS — M65331 Trigger finger, right middle finger: Secondary | ICD-10-CM

## 2022-08-24 DIAGNOSIS — G5601 Carpal tunnel syndrome, right upper limb: Secondary | ICD-10-CM

## 2022-08-24 LAB — TOXASSURE SELECT,+ANTIDEPR,UR

## 2022-08-24 NOTE — Telephone Encounter (Signed)
Urine drug screen for this encounter is consistent for prescribed medication. She was given oxycodone by Dr Letta Pate for surgery.

## 2022-08-24 NOTE — Progress Notes (Signed)
Post-Op Visit Note   Patient: [name] [name]           Date of Birth: 1956/01/24           MRN: [id number] Visit Date: 08/24/2022 PCP: Gerrit Heck, MD   Assessment & Plan: Follow-up carpal tunnel release and right middle finger trigger finger release both on the right hand.  Both incisions look good sutures are harvested.  Recheck 2 weeks still has some catching and may not had complete release of A1 pulley.  With anesthesia she was not able to actively flex and extend her finger due to some sedation medicine that she had cannot be woken up enough to participate to see if she had any further catching after the carpal tunnel release had been performed.  Good relief of preop numbness.  I will recheck her in 2 weeks.  Splint DIP joint dorsally she will leave it on for a week.  Chief Complaint:  Chief Complaint  Patient presents with   Right Hand - Routine Post Op, Follow-up    08/08/2022 Right CTR, Right middle finger trigger finger release   Visit Diagnoses:  1. Trigger finger, right middle finger   2. Carpal tunnel syndrome, right upper limb     Plan: sutures harvested right hand.   Follow-Up Instructions: No follow-ups on file.   Orders:  No orders of the defined types were placed in this encounter.  No orders of the defined types were placed in this encounter.   Imaging: No results found.  PMFS History: Patient Active Problem List   Diagnosis Date Noted   Fatigue 07/28/2022   Osteopenia determined by x-ray 07/28/2022   Chondromalacia patellae, left knee 07/22/2022   Carpal tunnel syndrome, right upper limb 07/22/2022   Carpal tunnel syndrome, left upper limb 02/25/2022   Hx of fusion of cervical spine 12/14/2021   Cervical radiculopathy 11/09/2021   Trigger finger, right middle finger 11/09/2021   Lung nodules 11/09/2021   OSA (obstructive sleep apnea) 05/28/2021   Exertional dyspnea 05/28/2021   Bradycardia 12/27/2020   Lower extremity edema 11/10/2019    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   Allergic rhinitis 04/15/2019   Chronic pain 04/15/2019   Memory difficulties 04/15/2019   Pre-diabetes 04/15/2019   Class 2 severe obesity due to excess calories with serious comorbidity and body mass index (BMI) of 36.0 to 36.9 in adult [facility]) 04/04/2017   Degeneration of lumbar intervertebral disc 11/28/2014   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/28/2014   History of lumbar fusion 11/28/2014   Hypokalemia 11/13/2014   Neuropathy 11/13/2014   Primary insomnia 11/13/2014   Postlaminectomy syndrome, cervical region 12/09/2013   Heart palpitations 10/19/2012   Dizziness 04/22/2012   Atypical chest pain 04/09/2012   Hyperlipemia 04/09/2012   Cervical pain 01/26/2012   Postlaminectomy syndrome, lumbar region 08/11/2011   Crohn's disease ([place])    Essential hypertension 09/02/2008   Past Medical History:  Diagnosis Date   Anemia    Blood transfusion without reported diagnosis    Crohn's disease ([place])    Disorder of sacroiliac joint    Exertional dyspnea 05/28/2021   Hyperlipidemia    Hypertension    Ischemic colitis ([place])    Lumbago    Lumbar post-laminectomy syndrome    Lumbosacral neuritis    Lumbosacral radiculitis    OSA (obstructive sleep apnea) 05/28/2021   Sciatica     Family History  Problem Relation Age of Onset   Hyperlipidemia Mother  Heart attack Mother    Heart disease Mother 48       CHF; CAD   Hypertension Mother    Heart failure Mother    Diabetes Sister    Lupus Sister    Kidney failure Sister    Hyperlipidemia Sister    Hypertension Sister    Stroke Maternal Aunt    Hypertension Brother    Colon cancer Neg Hx    Rectal cancer Neg Hx    Breast cancer Neg Hx     Past Surgical History:  Procedure Laterality Date   ABDOMINAL HYSTERECTOMY  1982   DUB; ovaries intact   bladder surg  1992   bladder retraction   BREAST BIOPSY     BREAST REDUCTION SURGERY  1984   CARDIAC  CATHETERIZATION  1990's   [name]   COLON SURGERY  05/11/11   [facility] Lysis of adhesions Crohn's   MASS EXCISION Left 01/03/2018   Procedure: EXCISION LEFT UPPER BACK LIPOMA ERAS PATH;  Surgeon: Erroll Luna, MD;  Location: [place];  Service: General;  Laterality: Left;   REDUCTION MAMMAPLASTY     SPINE SURGERY     5 total (1 upper, 4 lumbar)   SPINE SURGERY     nerve stimulator on right side   Social History   Occupational History   Occupation: disability    Comment: 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  Tobacco Use   Smoking status: Former    Packs/day: 0.50    Years: 20.00    Total pack years: 10.00    Types: Cigarettes    Quit date: 07/21/2008    Years since quitting: 14.1   Smokeless tobacco: Never  Vaping Use   Vaping Use: Never used  Substance and Sexual Activity   Alcohol use: No   Drug use: No   Sexual activity: Not on file

## 2022-09-07 ENCOUNTER — Encounter: Payer: Self-pay | Admitting: Orthopaedic Surgery

## 2022-09-07 ENCOUNTER — Ambulatory Visit (INDEPENDENT_AMBULATORY_CARE_PROVIDER_SITE_OTHER): Payer: 59 | Admitting: Orthopaedic Surgery

## 2022-09-07 VITALS — BP 162/81 | Ht 64.0 in | Wt 226.0 lb

## 2022-09-07 DIAGNOSIS — M65331 Trigger finger, right middle finger: Secondary | ICD-10-CM

## 2022-09-07 NOTE — Progress Notes (Signed)
Post-Op Visit Note   Patient: [name] [name]           Date of Birth: 1955-11-08           MRN: [id number] Visit Date: 09/07/2022 PCP: Gerrit Heck, MD   Assessment & Plan: Okay for carpal tunnel release.  Good relief of the carpal tunnel syndrome symptoms.  She still having some catching of the right middle finger.  Will buddy tape ring to long as she can use resisted flexion.  She has some Voltaren gel she can apply I will recheck in 2 weeks we discussed possibly injecting if she continues to have some catching.  Chief Complaint:  Chief Complaint  Patient presents with   Right Hand - Routine Post Op, Follow-up    08/08/2022  Right CTR, Right middle finger trigger finger release   Visit Diagnoses: No diagnosis found.  Plan: Recheck 2 weeks.  Follow-Up Instructions: No follow-ups on file.   Orders:  No orders of the defined types were placed in this encounter.  No orders of the defined types were placed in this encounter.   Imaging: No results found.  PMFS History: Patient Active Problem List   Diagnosis Date Noted   Fatigue 07/28/2022   Osteopenia determined by x-ray 07/28/2022   Chondromalacia patellae, left knee 07/22/2022   Carpal tunnel syndrome, right upper limb 07/22/2022   Carpal tunnel syndrome, left upper limb 02/25/2022   Hx of fusion of cervical spine 12/14/2021   Cervical radiculopathy 11/09/2021   Trigger finger, right middle finger 11/09/2021   Lung nodules 11/09/2021   OSA (obstructive sleep apnea) 05/28/2021   Exertional dyspnea 05/28/2021   Bradycardia 12/27/2020   Lower extremity edema 11/10/2019   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   Allergic rhinitis 04/15/2019   Chronic pain 04/15/2019   Memory difficulties 04/15/2019   Pre-diabetes 04/15/2019   Class 2 severe obesity due to excess calories with serious comorbidity and body mass index (BMI) of 36.0 to 36.9 in adult [facility]) 04/04/2017   Degeneration of lumbar intervertebral  disc 11/28/2014   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/28/2014   History of lumbar fusion 11/28/2014   Hypokalemia 11/13/2014   Neuropathy 11/13/2014   Primary insomnia 11/13/2014   Postlaminectomy syndrome, cervical region 12/09/2013   Heart palpitations 10/19/2012   Dizziness 04/22/2012   Atypical chest pain 04/09/2012   Hyperlipemia 04/09/2012   Cervical pain 01/26/2012   Postlaminectomy syndrome, lumbar region 08/11/2011   Crohn's disease ([place])    Essential hypertension 09/02/2008   Past Medical History:  Diagnosis Date   Anemia    Blood transfusion without reported diagnosis    Crohn's disease ([place])    Disorder of sacroiliac joint    Exertional dyspnea 05/28/2021   Hyperlipidemia    Hypertension    Ischemic colitis ([place])    Lumbago    Lumbar post-laminectomy syndrome    Lumbosacral neuritis    Lumbosacral radiculitis    OSA (obstructive sleep apnea) 05/28/2021   Sciatica     Family History  Problem Relation Age of Onset   Hyperlipidemia Mother    Heart attack Mother    Heart disease Mother 12       CHF; CAD   Hypertension Mother    Heart failure Mother    Diabetes Sister    Lupus Sister    Kidney failure Sister    Hyperlipidemia Sister    Hypertension Sister    Stroke Maternal Aunt    Hypertension Brother  Colon cancer Neg Hx    Rectal cancer Neg Hx    Breast cancer Neg Hx     Past Surgical History:  Procedure Laterality Date   ABDOMINAL HYSTERECTOMY  1982   DUB; ovaries intact   bladder surg  1992   bladder retraction   BREAST BIOPSY     BREAST REDUCTION SURGERY  1984   CARDIAC CATHETERIZATION  72's   [name]   COLON SURGERY  05/11/11   [facility] Lysis of adhesions Crohn's   MASS EXCISION Left 01/03/2018   Procedure: EXCISION LEFT UPPER BACK LIPOMA ERAS PATH;  Surgeon: Erroll Luna, MD;  Location: [place];  Service: General;  Laterality: Left;   REDUCTION MAMMAPLASTY     SPINE SURGERY     5 total (1  upper, 4 lumbar)   SPINE SURGERY     nerve stimulator on right side   Social History   Occupational History   Occupation: disability    Comment: 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  Tobacco Use   Smoking status: Former    Packs/day: 0.50    Years: 20.00    Additional pack years: 0.00    Total pack years: 10.00    Types: Cigarettes    Quit date: 07/21/2008    Years since quitting: 14.1   Smokeless tobacco: Never  Vaping Use   Vaping Use: Never used  Substance and Sexual Activity   Alcohol use: No   Drug use: No   Sexual activity: Not on file

## 2022-09-09 ENCOUNTER — Other Ambulatory Visit: Payer: Self-pay

## 2022-09-09 ENCOUNTER — Telehealth (HOSPITAL_BASED_OUTPATIENT_CLINIC_OR_DEPARTMENT_OTHER): Payer: Self-pay | Admitting: Cardiovascular Disease

## 2022-09-09 MED ORDER — METOPROLOL SUCCINATE ER 25 MG PO TB24
12.5000 mg | ORAL_TABLET | Freq: Every day | ORAL | 0 refills | Status: DC
  Filled 2022-09-09: qty 45, 90d supply, fill #0

## 2022-09-09 NOTE — Telephone Encounter (Signed)
*  STAT* If patient is at the pharmacy, call can be transferred to refill team.   1. Which medications need to be refilled? (please list name of each medication and dose if known)   metoprolol succinate (TOPROL-XL) 25 MG 24 hr tablet   2. Which pharmacy/location (including street and city if local pharmacy) is medication to be sent to?  [place]   3. Do they need a 30 day or 90 day supply?   90 day  Patient stated she has a 3 tablets left.  Patient stated she will be using [facility] going forward.

## 2022-09-12 ENCOUNTER — Telehealth: Payer: Self-pay | Admitting: *Deleted

## 2022-09-12 NOTE — Telephone Encounter (Signed)
Patient left message re: medication which was not named. Attempted to call back for more detail. Call went to vmail/message left.

## 2022-09-19 ENCOUNTER — Encounter: Payer: 59 | Attending: Physical Medicine & Rehabilitation | Admitting: Registered Nurse

## 2022-09-19 ENCOUNTER — Encounter: Payer: Self-pay | Admitting: Registered Nurse

## 2022-09-19 ENCOUNTER — Other Ambulatory Visit: Payer: Self-pay

## 2022-09-19 VITALS — BP 136/77 | HR 53 | Ht 64.0 in | Wt 227.4 lb

## 2022-09-19 DIAGNOSIS — Z79891 Long term (current) use of opiate analgesic: Secondary | ICD-10-CM

## 2022-09-19 DIAGNOSIS — G894 Chronic pain syndrome: Secondary | ICD-10-CM

## 2022-09-19 DIAGNOSIS — M4807 Spinal stenosis, lumbosacral region: Secondary | ICD-10-CM

## 2022-09-19 DIAGNOSIS — R001 Bradycardia, unspecified: Secondary | ICD-10-CM | POA: Diagnosis not present

## 2022-09-19 DIAGNOSIS — Z5181 Encounter for therapeutic drug level monitoring: Secondary | ICD-10-CM | POA: Diagnosis not present

## 2022-09-19 DIAGNOSIS — M961 Postlaminectomy syndrome, not elsewhere classified: Secondary | ICD-10-CM | POA: Diagnosis not present

## 2022-09-19 MED ORDER — MORPHINE SULFATE 15 MG PO TABS
15.0000 mg | ORAL_TABLET | Freq: Two times a day (BID) | ORAL | 0 refills | Status: DC | PRN
  Filled 2022-09-19: qty 60, 30d supply, fill #0

## 2022-09-19 MED ORDER — MORPHINE SULFATE ER 15 MG PO TBCR
15.0000 mg | EXTENDED_RELEASE_TABLET | Freq: Three times a day (TID) | ORAL | 0 refills | Status: DC
  Filled 2022-09-19: qty 90, 30d supply, fill #0

## 2022-09-19 NOTE — Patient Instructions (Signed)
Send My-Chart Message on April 26th: Regarding your Medication

## 2022-09-19 NOTE — Progress Notes (Signed)
Subjective:    Patient ID: [name] [name], female    DOB: [birth year], 67 y.o.   MRN: [id number]  HPI: [name] [name] is a 67 y.o. female who returns for follow up appointment for chronic pain and medication refill. She states her pain is located in her lower back. She rates her pain 8. Her current exercise regime is walking and performing stretching exercises.  [name] [name] is bradycardic apical pulse checked. She will keep a vital log and F/U with her cardiologist she verbalizes understanding.   [name] [name] Morphine equivalent is 70.00 MME.  Last UDS was Performed on 08/19/2022 it was consistent.    Pain Inventory Average Pain 8 Pain Right Now 8 My pain is constant and sharp  In the last 24 hours, has pain interfered with the following? General activity 8 Relation with others 8 Enjoyment of life 8 What TIME of day is your pain at its worst? morning , daytime, and night Sleep (in general) Poor  Pain is worse with: walking, inactivity, and standing Pain improves with: rest, heat/ice, and medication Relief from Meds: 2  Family History  Problem Relation Age of Onset   Hyperlipidemia Mother    Heart attack Mother    Heart disease Mother 3       CHF; CAD   Hypertension Mother    Heart failure Mother    Diabetes Sister    Lupus Sister    Kidney failure Sister    Hyperlipidemia Sister    Hypertension Sister    Stroke Maternal Aunt    Hypertension Brother    Colon cancer Neg Hx    Rectal cancer Neg Hx    Breast cancer Neg Hx    Social History   Socioeconomic History   Marital status: Widowed    Spouse name: Not on file   Number of children: 3   Years of education: Not on file   Highest education level: Not on file  Occupational History   Occupation: disability    Comment: 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  Tobacco Use   Smoking status: Former    Packs/day: 0.50    Years: 20.00    Additional pack years: 0.00    Total pack years: 10.00    Types: Cigarettes    Quit date:  07/21/2008    Years since quitting: 14.1   Smokeless tobacco: Never  Vaping Use   Vaping Use: Never used  Substance and Sexual Activity   Alcohol use: No   Drug use: No   Sexual activity: Not on file  Other Topics Concern   Not on file  Social History Narrative   Marital status: widowed since 2002; not dating which is bad in 2018      Children: 3 children (45, 6, 40); 13 grandchildren; 1 gg      Employment: disability since 2009; retail      Lives: alone in [place] in apartment      Tobacco: quit in 2011      Alcohol: none      Drugs: none      Exercise: minimal in 2018      ADLs: independent with ADLs; drives      Seatbelt: 100%; no texting.   Social Determinants of Health   Financial Resource Strain: Not on file  Food Insecurity: No Food Insecurity (04/11/2022)   Hunger Vital Sign    Worried About Running Out of Food in the Last Year: Never true    Ran Out of Food  in the Last Year: Never true  Transportation Needs: No Transportation Needs (04/11/2022)   PRAPARE - Hydrologist (Medical): No    Lack of Transportation (Non-Medical): No  Physical Activity: Not on file  Stress: Not on file  Social Connections: Not on file   Past Surgical History:  Procedure Laterality Date   ABDOMINAL HYSTERECTOMY  1982   DUB; ovaries intact   bladder surg  1992   bladder retraction   BREAST BIOPSY     BREAST REDUCTION SURGERY  1984   CARDIAC CATHETERIZATION  [employer]  05/11/11   [facility] Lysis of adhesions Crohn's   MASS EXCISION Left 01/03/2018   Procedure: EXCISION LEFT UPPER BACK LIPOMA ERAS PATH;  Surgeon: Erroll Luna, MD;  Location: [place];  Service: General;  Laterality: Left;   REDUCTION MAMMAPLASTY     SPINE SURGERY     5 total (1 upper, 4 lumbar)   SPINE SURGERY     nerve stimulator on right side   Past Surgical History:  Procedure Laterality Date   ABDOMINAL HYSTERECTOMY  1982   DUB;  ovaries intact   bladder surg  1992   bladder retraction   BREAST BIOPSY     BREAST REDUCTION SURGERY  1984   CARDIAC CATHETERIZATION  1990's   [name]   COLON SURGERY  05/11/11   [facility] Lysis of adhesions Crohn's   MASS EXCISION Left 01/03/2018   Procedure: EXCISION LEFT UPPER BACK LIPOMA ERAS PATH;  Surgeon: Erroll Luna, MD;  Location: [place];  Service: General;  Laterality: Left;   REDUCTION MAMMAPLASTY     SPINE SURGERY     5 total (1 upper, 4 lumbar)   SPINE SURGERY     nerve stimulator on right side   Past Medical History:  Diagnosis Date   Anemia    Blood transfusion without reported diagnosis    Crohn's disease ([place])    Disorder of sacroiliac joint    Exertional dyspnea 05/28/2021   Hyperlipidemia    Hypertension    Ischemic colitis ([place])    Lumbago    Lumbar post-laminectomy syndrome    Lumbosacral neuritis    Lumbosacral radiculitis    OSA (obstructive sleep apnea) 05/28/2021   Sciatica    There were no vitals taken for this visit.  Opioid Risk Score:   Fall Risk Score:  `1  Depression screen [facility] 2/9     09/19/2022    8:24 AM 08/19/2022    8:41 AM 07/28/2022    9:22 AM 07/21/2022    9:44 AM 05/24/2022    8:16 AM 04/29/2022    9:32 AM 04/06/2022   11:50 AM  Depression screen PHQ 2/9  Decreased Interest 0 0 1 0 0 0 0  Down, Depressed, Hopeless 0 0 0 0 0 0 0  PHQ - 2 Score 0 0 1 0 0 0 0  Altered sleeping   1      Tired, decreased energy   1      Change in appetite   2      Feeling bad or failure about yourself    0      Trouble concentrating   0      Moving slowly or fidgety/restless   0      Suicidal thoughts   0      PHQ-9 Score   5      Difficult doing  work/chores   Somewhat difficult        Review of Systems  Constitutional: Negative.   HENT: Negative.    Eyes: Negative.   Respiratory: Negative.    Cardiovascular: Negative.   Gastrointestinal: Negative.   Endocrine: Negative.   Genitourinary: Negative.   Musculoskeletal:   Positive for arthralgias and back pain.  Skin: Negative.   Allergic/Immunologic: Negative.   Neurological: Negative.   Hematological: Negative.   Psychiatric/Behavioral: Negative.    All other systems reviewed and are negative.      Objective:   Physical Exam Vitals and nursing note reviewed.  Constitutional:      Appearance: Normal appearance.  Cardiovascular:     Rate and Rhythm: Normal rate and regular rhythm.     Pulses: Normal pulses.     Heart sounds: Normal heart sounds.  Pulmonary:     Effort: Pulmonary effort is normal.     Breath sounds: Normal breath sounds.  Musculoskeletal:     Cervical back: Normal range of motion and neck supple.     Comments: Normal Muscle Bulk and Muscle Testing Reveals:  Upper Extremities: Full ROM and Muscle Strength 5/5 Lumbar Hypersensitivity Lower Extremities: Full ROM and Muscle Strength 5/5 Arises from Table slowly  Antalgic  Gait     Skin:    General: Skin is warm and dry.  Neurological:     Mental Status: She is alert and oriented to person, place, and time.  Psychiatric:        Mood and Affect: Mood normal.        Behavior: Behavior normal.         Assessment & Plan:  1.Lumbar Postlaminectomy/ Spinal Stenosis Lumbar Region/ Post-Op Pain/Low back pain with radiating symptoms in L3-4 distribution:  continues to be limited by pain. Refilled:  MSIR 15 mg one tablet twice a day as needed for moderate to sever pain #60 and  Morphine Sulfate ER 15 mg Q 8 hours. 09/19/2022. We will continue the opioid monitoring program, this consists of regular clinic visits, examinations, urine drug screen, pill counts as well as use of [place] Controlled Substance Reporting system. A 12 month History has been reviewed on the [place] Controlled Substance Reporting System on 09/19/2022. Encouraged to Continue exercise regime.   2. Lumbar Radiculopathy: No complaints today. Continue Topamax:.Continue to Monitor 09/19/2022 3.Chronic   Right Knee Pain: No complaints today.  Continue with Ice Therapy and  Voltaren Gel. 09/19/2022 4. Muscle Spasm: Continue current treatment: Tizanidine. 09/19/2022  5. Lipoma: S/P Left  Lipoma Excision of upper back  By Dr. Brantley Stage on 01/03/2018. Surgery Following. Continue to Monitor. 09/19/2022.   6. Paresthesia of Skin: Continue Topamax. Continue to monitor. 09/19/2022 7. Adhesive Capsulitis of Right Shoulder:Right Shoulder Tendonitis Pain:  No complaints today. S/P Right Shoulder Cortisone Injection with Dr Letta Pate, with good relief noted. Continue to monitor. Continue HEP as Tolerated. 09/19/2022  8. Polyarthralgia: Continue HEP as Tolerated. Continue to Monitor.09/19/2022  9. Sacral Pain: No complaints today. Continue to Monitor. 09/19/2022 10. Right Hand Pain: S/P :Post right middle finger trigger finger release carpal tunnel release. Dr Lorin Mercy Following.  Continue to monitor.   11. Bradycardia: Apical pulse checked. Ms. Borelli was instructed to keep vital  log she will F/U with her Cardiologist. She verbalizes understanding.   F/U in 1 month

## 2022-09-20 ENCOUNTER — Other Ambulatory Visit: Payer: Self-pay | Admitting: Student

## 2022-09-26 NOTE — Progress Notes (Unsigned)
    SUBJECTIVE:   CHIEF COMPLAINT / HPI: Elevated A1c and Low Vit D  Patient is a 67 y.o. female with prediabetes who present today for elevated A1c to 7.0. Had previous A1c to 6.7 in 2022.  Mouth really dry and going to bathroom a lot. Drinking a lot of water. No vomiting.    Home medications include: none ACEi/ARB: no Statin: yes Patient endorses taking these medications as prescribed.  Most recent A1Cs:  Lab Results  Component Value Date   HGBA1C 7.0 (H) 07/28/2022   HGBA1C 6.1 01/28/2022   HGBA1C 6.3 (H) 11/09/2021   Last Microalbumin, LDL, Creatinine: Lab Results  Component Value Date   LDLCALC 81 08/05/2022   CREATININE 1.05 (H) 04/14/2022   HTN Controlled on HCTZ. BP at home with systolics 137-157 however here it is 130s  Low Vit D Level is 6.7, osteopenia on previous DEXA.  PERTINENT  PMH / PSH:   OBJECTIVE:   BP 132/78   Pulse 74   Ht 5\' 4"  (1.626 m)   Wt 229 lb (103.9 kg)   SpO2 96%   BMI 39.31 kg/m   General: Well appearing, NAD, awake, alert, responsive to questions Head: Normocephalic atraumatic CV: Regular rate and rhythm no murmurs rubs or gallops Respiratory: Clear to ausculation bilaterally, no wheezes rales or crackles, chest rises symmetrically,  no increased work of breathing  ASSESSMENT/PLAN:   Diabetes mellitus without complication A1c 6.7 in 2022, A1c 7.0 07/28/2022 morning team diagnosis of diabetes.  Patient is also symptomatic at times with polydipsia and polyuria. -Start metformin XR 500 daily -BMP -Urine microalbumin/creatinine-consider starting ARB at next visit if blood pressure can tolerate  Osteopenia determined by x-ray Vitamin D level 6.7.  Plan to supplement weekly for 8 weeks. -50,000 units vitamin D weekly for 8 weeks  Essential hypertension Controlled on chlorthalidone. -consider low dose losartan if BP can tolerate at next visit    Levin Erp, MD [facility]

## 2022-09-27 ENCOUNTER — Ambulatory Visit (INDEPENDENT_AMBULATORY_CARE_PROVIDER_SITE_OTHER): Payer: 59 | Admitting: Orthopaedic Surgery

## 2022-09-27 ENCOUNTER — Ambulatory Visit (INDEPENDENT_AMBULATORY_CARE_PROVIDER_SITE_OTHER): Payer: 59 | Admitting: Student

## 2022-09-27 ENCOUNTER — Encounter: Payer: Self-pay | Admitting: Orthopaedic Surgery

## 2022-09-27 ENCOUNTER — Other Ambulatory Visit: Payer: Self-pay

## 2022-09-27 ENCOUNTER — Other Ambulatory Visit: Payer: Self-pay | Admitting: Physical Medicine & Rehabilitation

## 2022-09-27 ENCOUNTER — Encounter: Payer: Self-pay | Admitting: Student

## 2022-09-27 VITALS — BP 134/60 | HR 53 | Ht 64.0 in | Wt 229.0 lb

## 2022-09-27 VITALS — BP 132/78 | HR 74 | Ht 64.0 in | Wt 229.0 lb

## 2022-09-27 DIAGNOSIS — E119 Type 2 diabetes mellitus without complications: Secondary | ICD-10-CM | POA: Diagnosis not present

## 2022-09-27 DIAGNOSIS — I10 Essential (primary) hypertension: Secondary | ICD-10-CM | POA: Diagnosis not present

## 2022-09-27 DIAGNOSIS — M65331 Trigger finger, right middle finger: Secondary | ICD-10-CM

## 2022-09-27 DIAGNOSIS — E7800 Pure hypercholesterolemia, unspecified: Secondary | ICD-10-CM | POA: Diagnosis not present

## 2022-09-27 DIAGNOSIS — M8580 Other specified disorders of bone density and structure, unspecified site: Secondary | ICD-10-CM | POA: Diagnosis not present

## 2022-09-27 DIAGNOSIS — G5601 Carpal tunnel syndrome, right upper limb: Secondary | ICD-10-CM

## 2022-09-27 MED ORDER — METFORMIN HCL ER 500 MG PO TB24
500.0000 mg | ORAL_TABLET | Freq: Every day | ORAL | 0 refills | Status: DC
  Filled 2022-09-27: qty 60, 60d supply, fill #0

## 2022-09-27 MED ORDER — VITAMIN D (ERGOCALCIFEROL) 1.25 MG (50000 UNIT) PO CAPS
50000.0000 [IU] | ORAL_CAPSULE | ORAL | 0 refills | Status: DC
  Filled 2022-09-27: qty 8, 56d supply, fill #0

## 2022-09-27 NOTE — Progress Notes (Signed)
Post-Op Visit Note   Patient: [name] [name]           Date of Birth: 1956/04/21           MRN: [id number] Visit Date: 09/27/2022 PCP: Levin Erp, MD   Assessment & Plan: Post carpal tunnel release right hand doing well.  She still having some catching right long finger and also wanted to her opposite hand.  Right long trigger finger injection performed.  Recheck 3 weeks.  Chief Complaint:  Chief Complaint  Patient presents with   Right Hand - Routine Post Op, Follow-up    08/08/2022 Right CTR, Right middle finger trigger finger release   Visit Diagnoses: No diagnosis found.  Plan: ROV 3 wks   Follow-Up Instructions: Return in about 3 weeks (around 10/18/2022).   Orders:  No orders of the defined types were placed in this encounter.  No orders of the defined types were placed in this encounter.   Imaging: No results found.  PMFS History: Patient Active Problem List   Diagnosis Date Noted   Diabetes mellitus without complication 09/27/2022   Fatigue 07/28/2022   Osteopenia determined by x-ray 07/28/2022   Chondromalacia patellae, left knee 07/22/2022   Carpal tunnel syndrome, right upper limb 07/22/2022   Carpal tunnel syndrome, left upper limb 02/25/2022   Hx of fusion of cervical spine 12/14/2021   Cervical radiculopathy 11/09/2021   Trigger finger, right middle finger 11/09/2021   Lung nodules 11/09/2021   OSA (obstructive sleep apnea) 05/28/2021   Exertional dyspnea 05/28/2021   Bradycardia 12/27/2020   Lower extremity edema 11/10/2019   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   Allergic rhinitis 04/15/2019   Chronic pain 04/15/2019   Memory difficulties 04/15/2019   Pre-diabetes 04/15/2019   Class 2 severe obesity due to excess calories with serious comorbidity and body mass index (BMI) of 36.0 to 36.9 in adult 04/04/2017   Degeneration of lumbar intervertebral disc 11/28/2014   Lumbar adjacent segment disease with spondylolisthesis  11/28/2014   History of lumbar fusion 11/28/2014   Hypokalemia 11/13/2014   Neuropathy 11/13/2014   Primary insomnia 11/13/2014   Postlaminectomy syndrome, cervical region 12/09/2013   Heart palpitations 10/19/2012   Dizziness 04/22/2012   Atypical chest pain 04/09/2012   Hyperlipemia 04/09/2012   Cervical pain 01/26/2012   Postlaminectomy syndrome, lumbar region 08/11/2011   Crohn's disease    Essential hypertension 09/02/2008   Past Medical History:  Diagnosis Date   Anemia    Blood transfusion without reported diagnosis    Crohn's disease    Disorder of sacroiliac joint    Exertional dyspnea 05/28/2021   Hyperlipidemia    Hypertension    Ischemic colitis    Lumbago    Lumbar post-laminectomy syndrome    Lumbosacral neuritis    Lumbosacral radiculitis    OSA (obstructive sleep apnea) 05/28/2021   Sciatica     Family History  Problem Relation Age of Onset   Hyperlipidemia Mother    Heart attack Mother    Heart disease Mother 79       CHF; CAD   Hypertension Mother    Heart failure Mother    Diabetes Sister    Lupus Sister    Kidney failure Sister    Hyperlipidemia Sister    Hypertension Sister    Stroke Maternal Aunt    Hypertension Brother    Colon cancer Neg Hx    Rectal cancer Neg Hx    Breast cancer Neg Hx  Past Surgical History:  Procedure Laterality Date   ABDOMINAL HYSTERECTOMY  1982   DUB; ovaries intact   bladder surg  1992   bladder retraction   BREAST BIOPSY     BREAST REDUCTION SURGERY  1984   CARDIAC CATHETERIZATION  1990's   [name]   COLON SURGERY  05/11/11   [facility] Lysis of adhesions Crohn's   MASS EXCISION Left 01/03/2018   Procedure: EXCISION LEFT UPPER BACK LIPOMA ERAS PATH;  Surgeon: Harriette Bouillon, MD;  Location: [facility];  Service: General;  Laterality: Left;   REDUCTION MAMMAPLASTY     SPINE SURGERY     5 total (1 upper, 4 lumbar)   SPINE SURGERY     nerve stimulator on right side   Social History    Occupational History   Occupation: disability    Comment: 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  Tobacco Use   Smoking status: Former    Packs/day: 0.50    Years: 20.00    Additional pack years: 0.00    Total pack years: 10.00    Types: Cigarettes    Quit date: 07/21/2008    Years since quitting: 14.1   Smokeless tobacco: Never  Vaping Use   Vaping Use: Never used  Substance and Sexual Activity   Alcohol use: No   Drug use: No   Sexual activity: Not on file

## 2022-09-27 NOTE — Assessment & Plan Note (Signed)
Vitamin D level 6.7.  Plan to supplement weekly for 8 weeks. -50,000 units vitamin D weekly for 8 weeks

## 2022-09-27 NOTE — Assessment & Plan Note (Signed)
Controlled on chlorthalidone. -consider low dose losartan if BP can tolerate at next visit

## 2022-09-27 NOTE — Assessment & Plan Note (Signed)
A1c 6.7 in 2022, A1c 7.0 07/28/2022 morning team diagnosis of diabetes.  Patient is also symptomatic at times with polydipsia and polyuria. -Start metformin XR 500 daily -BMP -Urine microalbumin/creatinine-consider starting ARB at next visit if blood pressure can tolerate

## 2022-09-27 NOTE — Patient Instructions (Addendum)
It was great to see you! Thank you for allowing me to participate in your care!   Our plans for today:  -We will start you on a low-dose metformin and follow-up with you in 1 month -We will get a urine microalbumin creatinine to check how your kidney function is doing-if this is elevated we could consider starting the blood pressure medicine that protects her kidneys as well -I will start you on vitamin D 50,000 units 1 capsule every week for 8 weeks and have you come back to recheck after that -We will get some labs today  Take care and seek immediate care sooner if you develop any concerns.  Levin Erp, MD

## 2022-09-28 LAB — BASIC METABOLIC PANEL
BUN/Creatinine Ratio: 13 (ref 12–28)
BUN: 12 mg/dL (ref 8–27)
CO2: 25 mmol/L (ref 20–29)
Calcium: 9.9 mg/dL (ref 8.7–10.3)
Chloride: 98 mmol/L (ref 96–106)
Creatinine, Ser: 0.95 mg/dL (ref 0.57–1.00)
Glucose: 151 mg/dL — ABNORMAL HIGH (ref 70–99)
Potassium: 3.6 mmol/L (ref 3.5–5.2)
Sodium: 142 mmol/L (ref 134–144)
eGFR: 66 mL/min/{1.73_m2} (ref >59)

## 2022-09-28 LAB — MICROALBUMIN / CREATININE URINE RATIO
Creatinine, Urine: 216.6 mg/dL
Microalb/Creat Ratio: 3 mg/g creat (ref 0–29)
Microalbumin, Urine: 7.1 ug/mL

## 2022-09-28 LAB — LIPID PANEL
Chol/HDL Ratio: 3.9 ratio (ref 0.0–4.4)
Cholesterol, Total: 145 mg/dL (ref 100–199)
HDL: 37 mg/dL — ABNORMAL LOW (ref >39)
LDL Chol Calc (NIH): 85 mg/dL (ref 0–99)
Triglycerides: 128 mg/dL (ref 0–149)
VLDL Cholesterol Cal: 23 mg/dL (ref 5–40)

## 2022-09-28 LAB — HEPATIC FUNCTION PANEL
ALT: 22 IU/L (ref 0–32)
AST: 30 IU/L (ref 0–40)
Albumin: 4.5 g/dL (ref 3.9–4.9)
Alkaline Phosphatase: 68 IU/L (ref 44–121)
Bilirubin Total: 1.2 mg/dL (ref 0.0–1.2)
Bilirubin, Direct: 0.26 mg/dL (ref 0.00–0.40)
Total Protein: 7.4 g/dL (ref 6.0–8.5)

## 2022-09-29 ENCOUNTER — Telehealth (HOSPITAL_BASED_OUTPATIENT_CLINIC_OR_DEPARTMENT_OTHER): Payer: Self-pay

## 2022-09-29 NOTE — Telephone Encounter (Addendum)
Seen by patient [name] [name] on 09/29/2022  8:36 AM; follow up mychart message sent to patient for scheduling   ----- Message from Alver Sorrow, NP sent at 09/29/2022  8:05 AM EDT ----- Normal liver enzymes.  Cholesterol numbers continue to improve but not yet at goal less than 70.  Recommend add Zetia 10 mg daily.  Due for 6 mos OV - please schedule OV within the next 3 mos. TY!

## 2022-10-03 NOTE — Telephone Encounter (Signed)
Call Placed to Ms. Thursby regarding her request for Narcan. No answer.  Left message for her to return the call.

## 2022-10-10 ENCOUNTER — Telehealth: Payer: Self-pay

## 2022-10-10 NOTE — Telephone Encounter (Signed)
Patient contacted.   She reports she will followup with PCP on 5/3.

## 2022-10-10 NOTE — Telephone Encounter (Signed)
Patient calls nurse line reporting undesired side effects to Metformin.   She reports she has been taking this medication since ~ 4/9. She reports she has been experiencing nausea and diarrhea ever since.   She denies any fevers, abdominal pain or bloody stools.   She is requesting an alternative.   Will forward to PCP.

## 2022-10-18 ENCOUNTER — Telehealth: Payer: Self-pay | Admitting: Student

## 2022-10-18 ENCOUNTER — Encounter: Payer: Self-pay | Admitting: Orthopaedic Surgery

## 2022-10-18 ENCOUNTER — Ambulatory Visit (INDEPENDENT_AMBULATORY_CARE_PROVIDER_SITE_OTHER): Payer: 59 | Admitting: Orthopaedic Surgery

## 2022-10-18 VITALS — BP 132/78 | HR 60 | Ht 64.0 in | Wt 216.0 lb

## 2022-10-18 DIAGNOSIS — M65332 Trigger finger, left middle finger: Secondary | ICD-10-CM

## 2022-10-18 DIAGNOSIS — M65331 Trigger finger, right middle finger: Secondary | ICD-10-CM

## 2022-10-18 NOTE — Telephone Encounter (Signed)
Contacted [name] [name] to schedule their annual wellness visit. Appointment made for 10/24/2022.  Thank you,  [facility] Support [facility] Direct dial  [phone]

## 2022-10-18 NOTE — Telephone Encounter (Signed)
Called patient to schedule Medicare Annual Wellness Visit (AWV). Left message for patient to call back and schedule Medicare Annual Wellness Visit (AWV).  Last date of AWV: AWVI eligible as of 09/21/2016  Please schedule an AWVI appointment at any time with [facility] VISIT.  If any questions, please contact me at [phone].    Thank you,  [facility] Support [facility] Direct dial  [phone]

## 2022-10-18 NOTE — Progress Notes (Unsigned)
Post-Op Visit Note   Patient: [name] [name]           Date of Birth: August 31, 1955           MRN: [id number] Visit Date: 10/18/2022 PCP: Levin Erp, MD   Assessment & Plan:  Chief Complaint:  Chief Complaint  Patient presents with   Right Hand - Follow-up, Routine Post Op    08/08/2022 Right CTR, Right middle finger trigger finger release   Left Index Finger - Pain   Visit Diagnoses: No diagnosis found.  Plan: ***  Follow-Up Instructions: No follow-ups on file.   Orders:  No orders of the defined types were placed in this encounter.  No orders of the defined types were placed in this encounter.   Imaging: No results found.  PMFS History: Patient Active Problem List   Diagnosis Date Noted   Diabetes mellitus without complication (H[facility]) 09/27/2022   Fatigue 07/28/2022   Osteopenia determined by x-ray 07/28/2022   Chondromalacia patellae, left knee 07/22/2022   Carpal tunnel syndrome, right upper limb 07/22/2022   Carpal tunnel syndrome, left upper limb 02/25/2022   Hx of fusion of cervical spine 12/14/2021   Cervical radiculopathy 11/09/2021   Trigger finger, right middle finger 11/09/2021   Lung nodules 11/09/2021   OSA (obstructive sleep apnea) 05/28/2021   Exertional dyspnea 05/28/2021   Bradycardia 12/27/2020   Lower extremity edema 11/10/2019   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   Allergic rhinitis 04/15/2019   Chronic pain 04/15/2019   Memory difficulties 04/15/2019   Pre-diabetes 04/15/2019   Class 2 severe obesity due to excess calories with serious comorbidity and body mass index (BMI) of 36.0 to 36.9 in adult [facility]) 04/04/2017   Degeneration of lumbar intervertebral disc 11/28/2014   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/28/2014   History of lumbar fusion 11/28/2014   Hypokalemia 11/13/2014   Neuropathy 11/13/2014   Primary insomnia 11/13/2014   Postlaminectomy syndrome, cervical region 12/09/2013   Heart  palpitations 10/19/2012   Dizziness 04/22/2012   Atypical chest pain 04/09/2012   Hyperlipemia 04/09/2012   Cervical pain 01/26/2012   Postlaminectomy syndrome, lumbar region 08/11/2011   Crohn's disease (HCC)    Essential hypertension 09/02/2008   Past Medical History:  Diagnosis Date   Anemia    Blood transfusion without reported diagnosis    Crohn's disease (HCC)    Disorder of sacroiliac joint    Exertional dyspnea 05/28/2021   Hyperlipidemia    Hypertension    Ischemic colitis (HCC)    Lumbago    Lumbar post-laminectomy syndrome    Lumbosacral neuritis    Lumbosacral radiculitis    OSA (obstructive sleep apnea) 05/28/2021   Sciatica     Family History  Problem Relation Age of Onset   Hyperlipidemia Mother    Heart attack Mother    Heart disease Mother 85       CHF; CAD   Hypertension Mother    Heart failure Mother    Diabetes Sister    Lupus Sister    Kidney failure Sister    Hyperlipidemia Sister    Hypertension Sister    Stroke Maternal Aunt    Hypertension Brother    Colon cancer Neg Hx    Rectal cancer Neg Hx    Breast cancer Neg Hx     Past Surgical History:  Procedure Laterality Date   ABDOMINAL HYSTERECTOMY  1982   DUB; ovaries intact   bladder surg  1992   bladder retraction  BREAST BIOPSY     BREAST REDUCTION SURGERY  1984   CARDIAC CATHETERIZATION  1990's   [name]   COLON SURGERY  05/11/11   [facility] Lysis of adhesions Crohn's   MASS EXCISION Left 01/03/2018   Procedure: EXCISION LEFT UPPER BACK LIPOMA ERAS PATH;  Surgeon: Harriette Bouillon, MD;  Location: [facility];  Service: General;  Laterality: Left;   REDUCTION MAMMAPLASTY     SPINE SURGERY     5 total (1 upper, 4 lumbar)   SPINE SURGERY     nerve stimulator on right side   Social History   Occupational History   Occupation: disability    Comment: 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  Tobacco Use   Smoking status: Former    Packs/day: 0.50    Years: 20.00    Additional pack  years: 0.00    Total pack years: 10.00    Types: Cigarettes    Quit date: 07/21/2008    Years since quitting: 14.2   Smokeless tobacco: Never  Vaping Use   Vaping Use: Never used  Substance and Sexual Activity   Alcohol use: No   Drug use: No   Sexual activity: Not on file

## 2022-10-19 ENCOUNTER — Encounter: Payer: 59 | Attending: Physical Medicine & Rehabilitation | Admitting: Registered Nurse

## 2022-10-19 ENCOUNTER — Encounter: Payer: Self-pay | Admitting: Registered Nurse

## 2022-10-19 ENCOUNTER — Other Ambulatory Visit: Payer: Self-pay

## 2022-10-19 ENCOUNTER — Other Ambulatory Visit: Payer: Self-pay | Admitting: Physical Medicine & Rehabilitation

## 2022-10-19 VITALS — BP 130/80 | HR 54 | Ht 64.0 in | Wt 229.0 lb

## 2022-10-19 DIAGNOSIS — G894 Chronic pain syndrome: Secondary | ICD-10-CM | POA: Diagnosis not present

## 2022-10-19 DIAGNOSIS — M4807 Spinal stenosis, lumbosacral region: Secondary | ICD-10-CM | POA: Diagnosis not present

## 2022-10-19 DIAGNOSIS — M961 Postlaminectomy syndrome, not elsewhere classified: Secondary | ICD-10-CM

## 2022-10-19 DIAGNOSIS — Z79891 Long term (current) use of opiate analgesic: Secondary | ICD-10-CM

## 2022-10-19 DIAGNOSIS — R001 Bradycardia, unspecified: Secondary | ICD-10-CM | POA: Diagnosis not present

## 2022-10-19 DIAGNOSIS — M79642 Pain in left hand: Secondary | ICD-10-CM

## 2022-10-19 DIAGNOSIS — M79641 Pain in right hand: Secondary | ICD-10-CM | POA: Diagnosis not present

## 2022-10-19 DIAGNOSIS — Z5181 Encounter for therapeutic drug level monitoring: Secondary | ICD-10-CM | POA: Diagnosis not present

## 2022-10-19 MED ORDER — MORPHINE SULFATE 15 MG PO TABS
15.0000 mg | ORAL_TABLET | Freq: Two times a day (BID) | ORAL | 0 refills | Status: DC | PRN
  Filled 2022-10-19: qty 60, 30d supply, fill #0

## 2022-10-19 MED ORDER — NALOXONE HCL 4 MG/0.1ML NA LIQD
NASAL | 0 refills | Status: DC
  Filled 2022-10-19: qty 2, 1d supply, fill #0
  Filled 2022-11-01: qty 2, 30d supply, fill #0

## 2022-10-19 MED ORDER — MORPHINE SULFATE ER 15 MG PO TBCR
15.0000 mg | EXTENDED_RELEASE_TABLET | Freq: Three times a day (TID) | ORAL | 0 refills | Status: DC
  Filled 2022-10-19: qty 90, 30d supply, fill #0

## 2022-10-19 NOTE — Progress Notes (Signed)
Subjective:    Patient ID: [name] [name], female    DOB: 1955-08-04, 67 y.o.   MRN: [id number]  HPI: [name] [name] is a 67 y.o. female who returns for follow up appointment for chronic pain and medication refill. She states her pain is located in her bilateral hands and Dr Ophelia Charter is following. She also reports lower back pain. She rates her pain 8. Her current exercise regime is walking and performing stretching exercises.  [name] [name] equivalent is 75.00 MME.   Last UDS was Performed on 08/19/2022, it was consistent.      Pain Inventory Average Pain 8 Pain Right Now 8 My pain is constant, sharp, and aching  In the last 24 hours, has pain interfered with the following? General activity 8 Relation with others 9 Enjoyment of life 9 What TIME of day is your pain at its worst? morning , daytime, and night Sleep (in general) Poor  Pain is worse with: walking, sitting, inactivity, and standing Pain improves with: rest, heat/ice, and medication Relief from Meds: 2  Family History  Problem Relation Age of Onset   Hyperlipidemia Mother    Heart attack Mother    Heart disease Mother 53       CHF; CAD   Hypertension Mother    Heart failure Mother    Diabetes Sister    Lupus Sister    Kidney failure Sister    Hyperlipidemia Sister    Hypertension Sister    Stroke Maternal Aunt    Hypertension Brother    Colon cancer Neg Hx    Rectal cancer Neg Hx    Breast cancer Neg Hx    Social History   Socioeconomic History   Marital status: Widowed    Spouse name: Not on file   Number of children: 3   Years of education: Not on file   Highest education level: Associate degree: occupational, Scientist, product/process development, or vocational program  Occupational History   Occupation: disability    Comment: 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  Tobacco Use   Smoking status: Former    Packs/day: 0.50    Years: 20.00    Additional pack years: 0.00    Total pack years: 10.00    Types: Cigarettes    Quit  date: 07/21/2008    Years since quitting: 14.2   Smokeless tobacco: Never  Vaping Use   Vaping Use: Never used  Substance and Sexual Activity   Alcohol use: No   Drug use: No   Sexual activity: Not on file  Other Topics Concern   Not on file  Social History Narrative   Marital status: widowed since 2002; not dating which is bad in 2018      Children: 3 children (45, 37, 40); 13 grandchildren; 1 gg      Employment: disability since 2009; retail      Lives: alone in [place] in apartment      Tobacco: quit in 2011      Alcohol: none      Drugs: none      Exercise: minimal in 2018      ADLs: independent with ADLs; drives      Seatbelt: 100%; no texting.   Social Determinants of Health   Financial Resource Strain: Low Risk  (09/23/2022)   Overall Financial Resource Strain (CARDIA)    Difficulty of Paying Living Expenses: Not very hard  Food Insecurity: No Food Insecurity (09/23/2022)   Hunger Vital Sign    Worried About Running  Out of Food in the Last Year: Never true    Ran Out of Food in the Last Year: Never true  Transportation Needs: No Transportation Needs (09/23/2022)   PRAPARE - Administrator, Civil Service (Medical): No    Lack of Transportation (Non-Medical): No  Physical Activity: Insufficiently Active (09/23/2022)   Exercise Vital Sign    Days of Exercise per Week: 2 days    Minutes of Exercise per Session: 30 min  Stress: No Stress Concern Present (09/23/2022)   [employer] of Occupational Health - Occupational Stress Questionnaire    Feeling of Stress : Only a little  Social Connections: Moderately Integrated (09/23/2022)   Social Connection and Isolation Panel [NHANES]    Frequency of Communication with Friends and Family: Three times a week    Frequency of Social Gatherings with Friends and Family: Once a week    Attends Religious Services: More than 4 times per year    Active Member of [employer] or Organizations: Yes    Attends Banker  Meetings: More than 4 times per year    Marital Status: Widowed   Past Surgical History:  Procedure Laterality Date   ABDOMINAL HYSTERECTOMY  1982   DUB; ovaries intact   bladder surg  1992   bladder retraction   BREAST BIOPSY     BREAST REDUCTION SURGERY  1984   CARDIAC CATHETERIZATION  1990's   [name]   COLON SURGERY  05/11/11   [facility] of adhesions Crohn's   MASS EXCISION Left 01/03/2018   Procedure: EXCISION LEFT UPPER BACK LIPOMA ERAS PATH;  Surgeon: Harriette Bouillon, MD;  Location: [facility];  Service: General;  Laterality: Left;   REDUCTION MAMMAPLASTY     SPINE SURGERY     5 total (1 upper, 4 lumbar)   SPINE SURGERY     nerve stimulator on right side   Past Surgical History:  Procedure Laterality Date   ABDOMINAL HYSTERECTOMY  1982   DUB; ovaries intact   bladder surg  1992   bladder retraction   BREAST BIOPSY     BREAST REDUCTION SURGERY  1984   CARDIAC CATHETERIZATION  1990's   [name]   COLON SURGERY  05/11/11   [facility] adhesions Crohn's   MASS EXCISION Left 01/03/2018   Procedure: EXCISION LEFT UPPER BACK LIPOMA ERAS PATH;  Surgeon: Harriette Bouillon, MD;  Location: [facility];  Service: General;  Laterality: Left;   REDUCTION MAMMAPLASTY     SPINE SURGERY     5 total (1 upper, 4 lumbar)   SPINE SURGERY     nerve stimulator on right side   Past Medical History:  Diagnosis Date   Anemia    Blood transfusion without reported diagnosis    Crohn's disease (HCC)    Disorder of sacroiliac joint    Exertional dyspnea 05/28/2021   Hyperlipidemia    Hypertension    Ischemic colitis (HCC)    Lumbago    Lumbar post-laminectomy syndrome    Lumbosacral neuritis    Lumbosacral radiculitis    OSA (obstructive sleep apnea) 05/28/2021   Sciatica    Ht 5\' 4"  (1.626 m)   Wt 229 lb (103.9 kg)   BMI 39.31 kg/m   Opioid Risk Score:   Fall Risk Score:  `1  Depression screen [facility] 2/9     10/19/2022    8:18 AM 09/27/2022     8:22 AM 09/19/2022    8:24 AM  08/19/2022    8:41 AM 07/28/2022    9:22 AM 07/21/2022    9:44 AM 05/24/2022    8:16 AM  Depression screen PHQ 2/9  Decreased Interest 0 0 0 0 1 0 0  Down, Depressed, Hopeless 0 0 0 0 0 0 0  PHQ - 2 Score 0 0 0 0 1 0 0  Altered sleeping  0   1    Tired, decreased energy  1   1    Change in appetite  3   2    Feeling bad or failure about yourself   0   0    Trouble concentrating  0   0    Moving slowly or fidgety/restless  0   0    Suicidal thoughts  0   0    PHQ-9 Score  4   5    Difficult doing work/chores     Somewhat difficult      Review of Systems  Musculoskeletal:  Positive for back pain.       Right side tailbone pain      Objective:   Physical Exam Vitals and nursing note reviewed.  Constitutional:      Appearance: Normal appearance.  Cardiovascular:     Rate and Rhythm: Normal rate and regular rhythm.     Pulses: Normal pulses.     Heart sounds: Normal heart sounds.  Pulmonary:     Effort: Pulmonary effort is normal.     Breath sounds: Normal breath sounds.  Musculoskeletal:     Cervical back: Normal range of motion and neck supple.     Comments: Normal Muscle Bulk and Muscle Testing Reveals:  \Upper Extremities:Full ROM and Muscle Strength 5/5  Lumbar Paraspinal Tenderness: L-4-L-5 Lower Extremities: Full ROM and Muscle Strength 5/5 Arises from Table slowly Narrow based Gait     Skin:    General: Skin is warm and dry.  Neurological:     Mental Status: She is alert and oriented to person, place, and time.  Psychiatric:        Mood and Affect: Mood normal.        Behavior: Behavior normal.         Assessment & Plan:  1.Lumbar Postlaminectomy/ Spinal Stenosis Lumbar Region/ Post-Op Pain/Low back pain with radiating symptoms in L3-4 distribution:  continues to be limited by pain. Refilled:  MSIR 15 mg one tablet twice a day as needed for moderate to sever pain #60 and  [name] Sulfate ER 15 mg Q 8 hours. 10/19/2022. We will  continue the opioid monitoring program, this consists of regular clinic visits, examinations, urine drug screen, pill counts as well as use of [place] Controlled Substance Reporting system. A 12 month History has been reviewed on the [place] Controlled Substance Reporting System on 10/19/2022. Encouraged to Continue exercise regime.   2. Lumbar Radiculopathy: No complaints today. Continue Topamax:.Continue to Monitor 10/19/2022 3.Chronic  Right Knee Pain: No complaints today.  Continue with Ice Therapy and  Voltaren Gel. 10/19/2022 4. Muscle Spasm: Continue current treatment: Tizanidine. 10/19/2022  5. Lipoma: S/P Left  Lipoma Excision of upper back  By Dr. Luisa Hart on 01/03/2018. Surgery Following. Continue to Monitor. 10/19/2022.   6. Paresthesia of Skin: Continue Topamax. Continue to monitor. 10/19/2022 7. Adhesive Capsulitis of Right Shoulder:Right Shoulder Tendonitis Pain:  No complaints today. S/P Right Shoulder Cortisone Injection with Dr Wynn Banker, with good relief noted. Continue to monitor. Continue HEP as Tolerated. 10/19/2022  8. Polyarthralgia: Continue  HEP as Tolerated. Continue to Monitor.10/19/2022  9. Sacral Pain: No complaints today. Continue to Monitor. 10/19/2022 10. Right Hand Pain: S/P :Post right middle finger trigger finger release carpal tunnel release. Dr Ophelia Charter Following.  Continue to monitor. 10/19/2022  11. Left hand Pain Left Index Trigger Finger: Dr Ophelia Charter following. Note was reviewed.  12. Bradycardia: Apical pulse checked. Ms. Rossa was instructed to keep vital  log she will F/U with her Cardiologist. She verbalizes understanding. 10/19/2022   F/U in 1 month

## 2022-10-21 ENCOUNTER — Ambulatory Visit: Payer: 59 | Admitting: Student

## 2022-10-21 ENCOUNTER — Ambulatory Visit (HOSPITAL_BASED_OUTPATIENT_CLINIC_OR_DEPARTMENT_OTHER): Payer: 59 | Admitting: Family

## 2022-10-21 ENCOUNTER — Other Ambulatory Visit: Payer: Self-pay | Admitting: Student

## 2022-10-21 DIAGNOSIS — I10 Essential (primary) hypertension: Secondary | ICD-10-CM

## 2022-10-21 NOTE — Progress Notes (Deleted)
    SUBJECTIVE:   CHIEF COMPLAINT / HPI:   Diagnosed with diabetes at last visit on 09/27/2022-A1c of 7.0.  Started on metformin 500 daily.  She was unable to tolerate this medication due to diarrhea and nausea***.  PERTINENT  PMH / PSH: ***  OBJECTIVE:   There were no vitals taken for this visit.  ***  ASSESSMENT/PLAN:   No problem-specific Assessment & Plan notes found for this encounter.     Levin Erp, MD [facility]

## 2022-10-23 NOTE — Progress Notes (Unsigned)
I connected with  [name] on 10/24/2022 by a audio enabled telemedicine application and verified that I am speaking with the correct person using two identifiers.  Patient Location: Home  Provider Location: Home Office  I discussed the limitations of evaluation and management by telemedicine. The patient expressed understanding and agreed to proceed.  Subjective:   [name] [name] is a 67 y.o. female who presents for Medicare Annual (Subsequent) preventive examination.  Review of Systems    Per HPI unless specifically indicated below.  Cardiac Risk Factors include: advanced age (>38men, >26 women);female gender, Hypertension, and Hyperlipidemia.           Objective:       10/24/2022    1:59 PM 10/19/2022    8:36 AM 10/19/2022    8:13 AM  Vitals with BMI  Height   5\' 4"   Weight   229 lbs  BMI   39.29  Systolic 137  130  Diastolic 72  80  Pulse  54 56    Today's Vitals   10/24/22 1359 10/24/22 1405  BP: 137/72   PainSc:  8    There is no height or weight on file to calculate BMI.     09/27/2022    8:23 AM 07/28/2022    8:33 AM 01/28/2022    8:32 AM 11/09/2021    9:32 AM 08/18/2021    9:52 AM 07/21/2021    8:37 AM 05/16/2021    8:15 PM  Advanced Directives  Does Patient Have a Medical Advance Directive? No No No No No No No  Would patient like information on creating a medical advance directive? No - Patient declined  No - Patient declined No - Patient declined   No - Patient declined    Current Medications (verified) Outpatient Encounter Medications as of 10/24/2022  Medication Sig   aspirin EC 81 MG tablet Take 1 tablet (81 mg total) by mouth daily. Swallow whole.   atorvastatin (LIPITOR) 80 MG tablet Take 1 tablet (80 mg total) by mouth daily.   chlorthalidone (HYGROTON) 25 MG tablet TAKE 1 TABLET (25 MG TOTAL) BY MOUTH DAILY.   diclofenac Sodium (VOLTAREN) 1 % GEL APPLY 1 APPLICATION TOPICALLY 4 (FOUR) TIMES DAILY AS NEEDED.   fluticasone (FLONASE) 50  MCG/ACT nasal spray SPRAY 2 SPRAYS INTO EACH NOSTRIL EVERY DAY   metoprolol succinate (TOPROL-XL) 25 MG 24 hr tablet Take 0.5 tablets (12.5 mg total) by mouth daily.   morphine (MS CONTIN) 15 MG 12 hr tablet Take 1 tablet (15 mg total) by mouth every 8 (eight) hours.   morphine (MSIR) 15 MG tablet Take 1 tablet (15 mg total) by mouth 2 (two) times daily as needed for moderate pain.   naloxone (NARCAN) nasal spray 4 mg/0.1 mL Lethargic or respiratory depression after opioid use   potassium chloride (KLOR-CON) 20 MEQ packet USE 1 PACKET AS INSTRUCTED ONCE A DAY   tiZANidine (ZANAFLEX) 2 MG tablet TAKE 1 TABLET (2MG  TOTAL) BY MOUTH TWICE A DAY AS NEEDED FOR MUSCLE SPASM   Vitamin D, Ergocalciferol, (DRISDOL) 1.25 MG (50000 UNIT) CAPS capsule Take 1 capsule (50,000 Units total) by mouth every 7 (seven) days. For 8 weeks. Come back to office after this.   metFORMIN (GLUCOPHAGE-XR) 500 MG 24 hr tablet Take 1 tablet (500 mg total) by mouth daily with breakfast. (Patient not taking: Reported on 10/19/2022)   No facility-administered encounter medications on file as of 10/24/2022.    Allergies (verified) Orudis [ketoprofen]  History: Past Medical History:  Diagnosis Date   Anemia    Blood transfusion without reported diagnosis    Crohn's disease (HCC)    Disorder of sacroiliac joint    Exertional dyspnea 05/28/2021   Hyperlipidemia    Hypertension    Ischemic colitis (HCC)    Lumbago    Lumbar post-laminectomy syndrome    Lumbosacral neuritis    Lumbosacral radiculitis    OSA (obstructive sleep apnea) 05/28/2021   Sciatica    Past Surgical History:  Procedure Laterality Date   ABDOMINAL HYSTERECTOMY  1982   DUB; ovaries intact   bladder surg  1992   bladder retraction   BREAST BIOPSY     BREAST REDUCTION SURGERY  1984   CARDIAC CATHETERIZATION  1990's   [name]   COLON SURGERY  05/11/2011   [facility] of adhesions Crohn's   MASS EXCISION Left 01/03/2018   Procedure: EXCISION LEFT  UPPER BACK LIPOMA ERAS PATH;  Surgeon: Harriette Bouillon, MD;  Location: [facility];  Service: General;  Laterality: Left;   REDUCTION MAMMAPLASTY     SPINE SURGERY     5 total (1 upper, 4 lumbar)   SPINE SURGERY     nerve stimulator on right side   wrist and hand Right 07/2021   Dr. Annell Greening   Family History  Problem Relation Age of Onset   Hyperlipidemia Mother    Heart attack Mother    Heart disease Mother 6       CHF; CAD   Hypertension Mother    Heart failure Mother    Diabetes Sister    Lupus Sister    Kidney failure Sister    Hyperlipidemia Sister    Hypertension Sister    Stroke Maternal Aunt    Hypertension Brother    Colon cancer Neg Hx    Rectal cancer Neg Hx    Breast cancer Neg Hx    Social History   Socioeconomic History   Marital status: Widowed    Spouse name: Not on file   Number of children: 3   Years of education: Not on file   Highest education level: Associate degree: occupational, Scientist, product/process development, or vocational program  Occupational History   Occupation: disability    Comment: 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  Tobacco Use   Smoking status: Former    Packs/day: 0.50    Years: 20.00    Additional pack years: 0.00    Total pack years: 10.00    Types: Cigarettes    Quit date: 07/21/2008    Years since quitting: 14.2   Smokeless tobacco: Never  Vaping Use   Vaping Use: Never used  Substance and Sexual Activity   Alcohol use: No   Drug use: No   Sexual activity: Not on file  Other Topics Concern   Not on file  Social History Narrative   Marital status: widowed since 2002; not dating which is bad in 2018      Children: 3 children (45, 26, 40); 13 grandchildren; 1 gg      Employment: disability since 2009; retail      Lives: alone in [place] in apartment      Tobacco: quit in 2011      Alcohol: none      Drugs: none      Exercise: minimal in 2018      ADLs: independent with ADLs; drives      Seatbelt: 100%; no texting.   Social  Determinants of Health  Financial Resource Strain: Low Risk  (10/24/2022)   Overall Financial Resource Strain (CARDIA)    Difficulty of Paying Living Expenses: Not very hard  Food Insecurity: No Food Insecurity (10/24/2022)   Hunger Vital Sign    Worried About Running Out of Food in the Last Year: Never true    Ran Out of Food in the Last Year: Never true  Transportation Needs: No Transportation Needs (10/24/2022)   PRAPARE - Administrator, Civil Service (Medical): No    Lack of Transportation (Non-Medical): No  Physical Activity: Inactive (10/24/2022)   Exercise Vital Sign    Days of Exercise per Week: 0 days    Minutes of Exercise per Session: 0 min  Stress: Stress Concern Present (10/24/2022)   [employer] of Occupational Health - Occupational Stress Questionnaire    Feeling of Stress : Very much  Social Connections: Moderately Integrated (10/24/2022)   Social Connection and Isolation Panel [NHANES]    Frequency of Communication with Friends and Family: Three times a week    Frequency of Social Gatherings with Friends and Family: Three times a week    Attends Religious Services: More than 4 times per year    Active Member of Clubs or Organizations: Yes    Attends Banker Meetings: More than 4 times per year    Marital Status: Widowed    Tobacco Counseling Counseling given: No   Clinical Intake:  Pre-visit preparation completed: No  Pain : 0-10 Pain Score: 8  Pain Type: Chronic pain Pain Location: Back Pain Orientation: Lower Pain Descriptors / Indicators: Aching Pain Onset: More than a month ago Pain Frequency: Constant     Nutritional Status: BMI > 30  Obese Nutritional Risks: Unintentional weight gain Diabetes: No  How often do you need to have someone help you when you read instructions, pamphlets, or other written materials from your doctor or pharmacy?: 1 - Never  Diabetic?Nutrition Risk Assessment:  Has the patient had any N/V/D  within the last 2 months?  Yes  Does the patient have any non-healing wounds?  No  Has the patient had any unintentional weight loss or weight gain?  Yes   Diabetes:  Is the patient diabetic?  Yes  If diabetic, was a CBG obtained today?  No  Did the patient bring in their glucometer from home?  No  How often do you monitor your CBG's? never.   Financial Strains and Diabetes Management:  Are you having any financial strains with the device, your supplies or your medication? No .  Does the patient want to be seen by Chronic Care Management for management of their diabetes?  No  Would the patient like to be referred to a Nutritionist or for Diabetic Management?  No   Diabetic Exams:  Diabetic Eye Exam: Overdue for diabetic eye exam. Pt has been advised about the importance in completing this exam. Patient advised to call and schedule an eye exam. Diabetic Foot Exam: Overdue, Pt has been advised about the importance in completing this exam. Pt is scheduled for diabetic foot exam on at next diabetic appt .   Interpreter Needed?: No  Information entered by :: Laurel Dimmer, cMA   Activities of Daily Living    10/24/2022    2:05 PM  In your present state of health, do you have any difficulty performing the following activities:  Hearing? 0  Vision? 1  Difficulty concentrating or making decisions? 1  Walking or climbing stairs? 1  Dressing  or bathing? 0  Doing errands, shopping? 0    Patient Care Team: Levin Erp, MD as PCP - General (Family Medicine) Chilton Si, MD as PCP - Cardiology (Cardiology) Doreene Eland, MD as Consulting Physician (Family Medicine)  Indicate any recent Medical Services you may have received from other than [facility] providers in the past year (date may be approximate).     Assessment:   This is a routine wellness examination for [name] [name].   Hearing/Vision screen Denies any hearing issues. Denies any change to her vision. Wear  glasses. Overdue Annual Eye Exam.   Dietary issues and exercise activities discussed: Exercise limited by: orthopedic condition(s);Other - see comments (Chronic Back pain)   Goals Addressed   None   Depression Screen    10/24/2022    2:04 PM 10/19/2022    8:18 AM 09/27/2022    8:22 AM 09/19/2022    8:24 AM 08/19/2022    8:41 AM 07/28/2022    9:22 AM 07/21/2022    9:44 AM  PHQ 2/9 Scores  PHQ - 2 Score 4 0 0 0 0 1 0  PHQ- 9 Score   4   5     Fall Risk    10/24/2022    2:04 PM 10/19/2022    8:18 AM 09/27/2022    8:23 AM 09/19/2022    8:24 AM 08/19/2022    8:40 AM  Fall Risk   Falls in the past year? 0 0 0 0 0  Number falls in past yr: 0 0 0    Injury with Fall? 0 0 0    Risk for fall due to : No Fall Risks    History of fall(s);Impaired balance/gait  Follow up Falls evaluation completed  Falls evaluation completed      FALL RISK PREVENTION PERTAINING TO THE HOME:  Any stairs in or around the home? Yes  If so, are there any without handrails? No  Home free of loose throw rugs in walkways, pet beds, electrical cords, etc? Yes  Adequate lighting in your home to reduce risk of falls? Yes   ASSISTIVE DEVICES UTILIZED TO PREVENT FALLS:  Life alert? No  Use of a cane, walker or w/c? Yes  Grab bars in the bathroom? No  Shower chair or bench in shower? No  Elevated toilet seat or a handicapped toilet? No   TIMED UP AND GO:  Was the test performed?Unable to perform, virtual appointment   Cognitive Function:        10/24/2022    2:08 PM  6CIT Screen  What Year? 0 points  What month? 0 points  What time? 0 points  Count back from 20 0 points  Months in reverse 0 points  Repeat phrase 0 points  Total Score 0 points    Immunizations Immunization History  Administered Date(s) Administered   Fluad Quad(high Dose 65+) 07/28/2022   Influenza Inj Mdck Quad Pf 04/03/2017   Influenza Split 04/23/2012   Influenza, Seasonal, Injecte, Preservative Fre 05/01/2013   Influenza,inj,Quad PF,6+  Mos 04/10/2014, 03/09/2016, 03/07/2018, 04/15/2019, 03/17/2021   Influenza-Unspecified 05/01/2013, 03/29/2016, 04/03/2017   PNEUMOCOCCAL CONJUGATE-20 07/28/2022   Tdap 11/18/2008, 05/10/2016    TDAP status: Up to date  Flu Vaccine status: Up to date  Pneumococcal vaccine status: Up to date  Covid-19 vaccine status: Information provided on how to obtain vaccines.   Qualifies for Shingles Vaccine? Yes   Zostavax completed No   Shingrix Completed?: No.    Education has been provided regarding the  importance of this vaccine. Patient has been advised to call insurance company to determine out of pocket expense if they have not yet received this vaccine. Advised may also receive vaccine at local pharmacy or Health Dept. Verbalized acceptance and understanding.  Screening Tests Health Maintenance  Topic Date Due   COVID-19 Vaccine (1) Never done   FOOT EXAM  Never done   OPHTHALMOLOGY EXAM  Never done   Zoster Vaccines- Shingrix (1 of 2) Never done   INFLUENZA VACCINE  01/19/2023   HEMOGLOBIN A1C  01/26/2023   Diabetic kidney evaluation - eGFR measurement  09/27/2023   Diabetic kidney evaluation - Urine ACR  09/27/2023   Medicare Annual Wellness (AWV)  10/24/2023   MAMMOGRAM  11/26/2023   DTaP/Tdap/Td (3 - Td or Tdap) 05/10/2026   COLONOSCOPY (Pts 45-45yrs Insurance coverage will need to be confirmed)  06/27/2027   Pneumonia Vaccine 79+ Years old  Completed   DEXA SCAN  Completed   Hepatitis C Screening  Completed   HPV VACCINES  Aged Out    Health Maintenance  Health Maintenance Due  Topic Date Due   COVID-19 Vaccine (1) Never done   FOOT EXAM  Never done   OPHTHALMOLOGY EXAM  Never done   Zoster Vaccines- Shingrix (1 of 2) Never done    Colorectal cancer screening: Type of screening: Colonoscopy. Completed 06/26/2017. Repeat every 10 years  Mammogram status: Completed 11/25/2021. Repeat every year  DEXA Scan: 11/25/2021  Lung Cancer Screening: (Low Dose CT Chest  recommended if Age 58-80 years, 30 pack-year currently smoking OR have quit w/in 15years.) does not qualify.   Lung Cancer Screening Referral: not applicable   Additional Screening:  Hepatitis C Screening: does qualify; Completed 05/10/2016  Vision Screening: Recommended annual ophthalmology exams for early detection of glaucoma and other disorders of the eye. Is the patient up to date with their annual eye exam?  No Who is the provider or what is the name of the office in which the patient attends annual eye exams? Overdue the patient will call and schedule an appt If pt is not established with a provider, would they like to be referred to a provider to establish care? No .   Dental Screening: Recommended annual dental exams for proper oral hygiene  Community Resource Referral / Chronic Care Management: CRR required this visit?  No   CCM required this visit?  No      Plan:     I have personally reviewed and noted the following in the patient's chart:   Medical and social history Use of alcohol, tobacco or illicit drugs  Current medications and supplements including opioid prescriptions. Patient is not currently taking opioid prescriptions. Functional ability and status Nutritional status Physical activity Advanced directives List of other physicians Hospitalizations, surgeries, and ER visits in previous 12 months Vitals Screenings to include cognitive, depression, and falls Referrals and appointments  In addition, I have reviewed and discussed with patient certain preventive protocols, quality metrics, and best practice recommendations. A written personalized care plan for preventive services as well as general preventive health recommendations were provided to patient.     Lonna Cobb, CMA   10/24/2022  Nurse Notes: Approximately 30 minute Non-Face -To-Face Medicare Wellness Visit

## 2022-10-24 ENCOUNTER — Ambulatory Visit (INDEPENDENT_AMBULATORY_CARE_PROVIDER_SITE_OTHER): Payer: 59

## 2022-10-24 VITALS — BP 137/72

## 2022-10-24 DIAGNOSIS — Z0000 Encounter for general adult medical examination without abnormal findings: Secondary | ICD-10-CM | POA: Diagnosis not present

## 2022-10-27 ENCOUNTER — Other Ambulatory Visit: Payer: Self-pay

## 2022-10-27 ENCOUNTER — Encounter: Payer: Self-pay | Admitting: Student

## 2022-10-27 ENCOUNTER — Ambulatory Visit (INDEPENDENT_AMBULATORY_CARE_PROVIDER_SITE_OTHER): Payer: 59 | Admitting: Student

## 2022-10-27 VITALS — BP 152/69 | HR 56 | Ht 64.0 in | Wt 228.2 lb

## 2022-10-27 DIAGNOSIS — E119 Type 2 diabetes mellitus without complications: Secondary | ICD-10-CM

## 2022-10-27 DIAGNOSIS — R0609 Other forms of dyspnea: Secondary | ICD-10-CM | POA: Diagnosis not present

## 2022-10-27 DIAGNOSIS — I10 Essential (primary) hypertension: Secondary | ICD-10-CM

## 2022-10-27 LAB — POCT GLYCOSYLATED HEMOGLOBIN (HGB A1C): HbA1c, POC (controlled diabetic range): 6.9 % (ref 0.0–7.0)

## 2022-10-27 MED ORDER — SEMAGLUTIDE(0.25 OR 0.5MG/DOS) 2 MG/3ML ~~LOC~~ SOPN
0.2500 mg | PEN_INJECTOR | SUBCUTANEOUS | 3 refills | Status: DC
  Filled 2022-10-27: qty 3, 42d supply, fill #0
  Filled 2022-11-25: qty 3, 42d supply, fill #1
  Filled 2023-01-12: qty 3, 42d supply, fill #2

## 2022-10-27 MED ORDER — LOSARTAN POTASSIUM 25 MG PO TABS
25.0000 mg | ORAL_TABLET | Freq: Every day | ORAL | 0 refills | Status: DC
  Filled 2022-10-27: qty 30, 30d supply, fill #0

## 2022-10-27 NOTE — Progress Notes (Signed)
SUBJECTIVE:   CHIEF COMPLAINT / HPI:   Diabetic Follow Up: Patient is a 67 y.o. female who present today for diabetic follow up. Diagnosed with diabetes at last visit on 09/27/2022-A1c of 7.0.  Started on metformin 500 daily.  She was unable to tolerate this medication due to diarrhea and nausea.   Home medications include: Metformin-not taking due to side effects ACEi/ARB: not yet Statin: yes Patient endorses taking these medications as prescribed.  Most recent A1Cs:  Lab Results  Component Value Date   HGBA1C 6.9 10/27/2022   HGBA1C 7.0 (H) 07/28/2022   HGBA1C 6.1 01/28/2022   Last Microalbumin, LDL, Creatinine: Lab Results  Component Value Date   LDLCALC 85 09/27/2022   CREATININE 0.95 09/27/2022   Hypertension: Patient is a 67 y.o. female who present today for follow up of hypertension.   Patient endorses no problems  Home medications include: Chlorthalidone 25 mg daily, metoprolol 12.5 mg daily Patient endorses taking these medications as prescribed. Denies any headache, vision changes, shortness of breath, lower extremity swelling or chest pain   Most recent creatinine trend:  Lab Results  Component Value Date   CREATININE 0.95 09/27/2022   CREATININE 1.05 (H) 04/14/2022   CREATININE 0.94 11/09/2021   Leg swelling  Exertional chest pain Patient says that she had has been having increased lower extremity swelling.  Does not lay completely flat at nighttime due to her back pain.  She says that she does start to have some mild left-sided chest pain on exertion-while walking long distances or going up stairs.  Last echocardiogram was in 2022 which showed grade 1 diastolic dysfunction and EF of 55 to 60%.  CT coronary angio showed a calcium score of 157 in 04/2022.  She has a cardiology appointment on 5/13.  I discussed with her to please discuss her exertional chest pain and leg swelling symptoms with them as she may need further workup of this through  cardiology.  PERTINENT  PMH / PSH: History of heart palpitations  OBJECTIVE:   BP (!) 152/69   Pulse (!) 56   Ht 5\' 4"  (1.626 m)   Wt 228 lb 4 oz (103.5 kg)   SpO2 100%   BMI 39.18 kg/m   General: Well appearing, NAD, awake, alert, responsive to questions Head: Normocephalic atraumatic CV: Regular rate and rhythm no murmurs rubs or gallops Respiratory: Clear to ausculation bilaterally, no wheezes rales or crackles, chest rises symmetrically,  no increased work of breathing on RA Abdomen: Soft, non-tender, non-distended, normoactive bowel sounds  Extremities: Moves upper and lower extremities freely, 1+ edema to mid shin bialterally Neuro: No focal deficits Skin: No rashes or lesions visualized   ASSESSMENT/PLAN:   Diabetes mellitus without complication (HCC) A1c 6.9 today-controlled. Was unable to tolerate metformin due to side effects.  We discussed other options for glycemic control including monitoring every 3 months without medication, adding a GLP-1 medication or adding SGLT2 medication.  After discussion shared decision making came to decision of initiating GLP-1 medicine.  She denies any personal or family history of endocrine cancers or disorders.  Blood pressure can also tolerate addition of low-dose ARB-will start losartan for patient as well. -Start semaglutide 0.25 weekly -Stop metformin -55-month A1c  Essential hypertension On repeat blood pressure was 152/69.  Will add ARB for both renal protection and hypertension control. -Continue chlorthalidone 25 mg daily -Continue metoprolol 12.5 mg daily -Start losartan 25 mg daily -2-week BP and BMP check  Exertional dyspnea Patient does have  some lower extremity swelling and exertional dyspnea/chest pain. Last Echo in 2022 wnl. I discussed with her to please mention this in her cardiology appointment on 5/13.  Patient aware and discussed ED/return precautions for this as well. -Cardiology f/u 5/13   Levin Erp,  MD [facility]

## 2022-10-27 NOTE — Assessment & Plan Note (Signed)
A1c 6.9 today-controlled. Was unable to tolerate metformin due to side effects.  We discussed other options for glycemic control including monitoring every 3 months without medication, adding a GLP-1 medication or adding SGLT2 medication.  After discussion shared decision making came to decision of initiating GLP-1 medicine.  She denies any personal or family history of endocrine cancers or disorders.  Blood pressure can also tolerate addition of low-dose ARB-will start losartan for patient as well. -Start semaglutide 0.25 weekly -Stop metformin -72-month A1c

## 2022-10-27 NOTE — Assessment & Plan Note (Signed)
On repeat blood pressure was 152/69.  Will add ARB for both renal protection and hypertension control. -Continue chlorthalidone 25 mg daily -Continue metoprolol 12.5 mg daily -Start losartan 25 mg daily -2-week BP and BMP check

## 2022-10-27 NOTE — Assessment & Plan Note (Signed)
Patient does have some lower extremity swelling and exertional dyspnea/chest pain. Last Echo in 2022 wnl. I discussed with her to please mention this in her cardiology appointment on 5/13.  Patient aware and discussed ED/return precautions for this as well. -Cardiology f/u 5/13

## 2022-10-27 NOTE — Patient Instructions (Addendum)
It was great to see you! Thank you for allowing me to participate in your care!   Our plans for today:  -Stop the metformin and start semaglutide once a week injections -I want you to return in 2 weeks to repeat your blood pressure and check your labs after starting this new medicine called losartan 1 tablet daily  Take care and seek immediate care sooner if you develop any concerns.  Levin Erp, MD

## 2022-10-28 ENCOUNTER — Other Ambulatory Visit: Payer: Self-pay

## 2022-10-31 ENCOUNTER — Ambulatory Visit (HOSPITAL_BASED_OUTPATIENT_CLINIC_OR_DEPARTMENT_OTHER): Payer: 59 | Admitting: Family

## 2022-11-01 ENCOUNTER — Other Ambulatory Visit: Payer: Self-pay

## 2022-11-10 ENCOUNTER — Ambulatory Visit (INDEPENDENT_AMBULATORY_CARE_PROVIDER_SITE_OTHER): Payer: 59 | Admitting: Student

## 2022-11-10 ENCOUNTER — Encounter: Payer: Self-pay | Admitting: Student

## 2022-11-10 ENCOUNTER — Other Ambulatory Visit: Payer: Self-pay

## 2022-11-10 VITALS — BP 128/57 | HR 62 | Ht 64.0 in | Wt 227.8 lb

## 2022-11-10 DIAGNOSIS — E119 Type 2 diabetes mellitus without complications: Secondary | ICD-10-CM

## 2022-11-10 DIAGNOSIS — I10 Essential (primary) hypertension: Secondary | ICD-10-CM | POA: Diagnosis not present

## 2022-11-10 MED ORDER — LOSARTAN POTASSIUM 25 MG PO TABS
12.5000 mg | ORAL_TABLET | Freq: Every day | ORAL | 0 refills | Status: DC
  Filled 2022-11-10 – 2022-11-25 (×2): qty 30, 60d supply, fill #0

## 2022-11-10 MED ORDER — LIDOCAINE 5 % EX PTCH
1.0000 | MEDICATED_PATCH | CUTANEOUS | 0 refills | Status: DC
  Filled 2022-11-10: qty 30, 30d supply, fill #0

## 2022-11-10 NOTE — Assessment & Plan Note (Addendum)
Diastolic low today at 57 and some tiredness with dose of losartan. Given age will lower losartan to 12.5 daily and continue rest of regimen. Will check BMP today for electrolytes. -Continue chlorthalidone 25 mg daily -Continue metoprolol 12.5 mg daily -Reduce losartan to 12.5 mg daily -BMP -2 week nurse BP check

## 2022-11-10 NOTE — Progress Notes (Signed)
    SUBJECTIVE:   CHIEF COMPLAINT / HPI: BP Follow up  Hypertension: Patient is a 67 y.o. female who present today for follow up of hypertension.   Patient endorses some tiredness with losartan-no additional dizziness-this has been similar to before per patient  Home medications include: chlorthalidone 25 mg daily, metoprolol 12.5 mg daily, losartan 25 mg daily (losartan started on last visit) Patient endorses taking these medications as prescribed.  Most recent creatinine trend:  Lab Results  Component Value Date   CREATININE 0.95 09/27/2022   CREATININE 1.05 (H) 04/14/2022   CREATININE 0.94 11/09/2021   Patient does not check blood pressure at home-obtaining a cuff to check at home  Patient has had a BMP in the past 1 year.  PERTINENT  PMH / PSH: [place]etes  OBJECTIVE:   BP (!) 128/57   Pulse 62   Ht 5\' 4"  (1.626 m)   Wt 227 lb 12.8 oz (103.3 kg)   SpO2 98%   BMI 39.10 kg/m   General: Well appearing, NAD, awake, alert, responsive to questions Head: Normocephalic atraumatic CV: Regular rate and rhythm no murmurs rubs or gallops Respiratory: Clear to ausculation bilaterally, no wheezes rales or crackles, chest rises symmetrically,  no increased work of breathing ASSESSMENT/PLAN:   Essential hypertension Diastolic low today at 57 and some tiredness with dose of losartan. Given age will lower losartan to 12.5 daily and continue rest of regimen. Will check BMP today for electrolytes. -Continue chlorthalidone 25 mg daily -Continue metoprolol 12.5 mg daily -Reduce losartan to 12.5 mg daily -BMP -2 week nurse BP check  Levin Erp, MD [facility]

## 2022-11-10 NOTE — Patient Instructions (Signed)
It was great to see you! Thank you for allowing me to participate in your care!   Our plans for today:  - We will reduce your losartan to 12.5 mg daily (half of your 25 mg) - Please schedule a nurse visit for a BP recheck in 2 week - We will get labs today  Take care and seek immediate care sooner if you develop any concerns.  Levin Erp, MD

## 2022-11-11 ENCOUNTER — Other Ambulatory Visit (HOSPITAL_COMMUNITY): Payer: Self-pay

## 2022-11-11 ENCOUNTER — Other Ambulatory Visit: Payer: Self-pay

## 2022-11-11 LAB — BASIC METABOLIC PANEL
BUN/Creatinine Ratio: 12 (ref 12–28)
BUN: 12 mg/dL (ref 8–27)
CO2: 28 mmol/L (ref 20–29)
Calcium: 9.5 mg/dL (ref 8.7–10.3)
Chloride: 99 mmol/L (ref 96–106)
Creatinine, Ser: 1.03 mg/dL — ABNORMAL HIGH (ref 0.57–1.00)
Glucose: 147 mg/dL — ABNORMAL HIGH (ref 70–99)
Potassium: 3.8 mmol/L (ref 3.5–5.2)
Sodium: 142 mmol/L (ref 134–144)
eGFR: 60 mL/min/{1.73_m2} (ref >59)

## 2022-11-15 ENCOUNTER — Telehealth: Payer: Self-pay

## 2022-11-15 ENCOUNTER — Encounter: Payer: Self-pay | Admitting: Registered Nurse

## 2022-11-15 ENCOUNTER — Encounter (HOSPITAL_BASED_OUTPATIENT_CLINIC_OR_DEPARTMENT_OTHER): Payer: 59 | Admitting: Registered Nurse

## 2022-11-15 ENCOUNTER — Encounter: Payer: Self-pay | Admitting: Orthopaedic Surgery

## 2022-11-15 ENCOUNTER — Other Ambulatory Visit: Payer: Self-pay

## 2022-11-15 ENCOUNTER — Ambulatory Visit (INDEPENDENT_AMBULATORY_CARE_PROVIDER_SITE_OTHER): Payer: 59 | Admitting: Orthopaedic Surgery

## 2022-11-15 VITALS — BP 123/76 | HR 52 | Ht 64.0 in | Wt 216.0 lb

## 2022-11-15 VITALS — BP 162/67 | HR 67 | Ht 64.0 in | Wt 227.0 lb

## 2022-11-15 DIAGNOSIS — G894 Chronic pain syndrome: Secondary | ICD-10-CM

## 2022-11-15 DIAGNOSIS — Z79891 Long term (current) use of opiate analgesic: Secondary | ICD-10-CM | POA: Diagnosis not present

## 2022-11-15 DIAGNOSIS — R001 Bradycardia, unspecified: Secondary | ICD-10-CM | POA: Diagnosis not present

## 2022-11-15 DIAGNOSIS — M79641 Pain in right hand: Secondary | ICD-10-CM | POA: Diagnosis not present

## 2022-11-15 DIAGNOSIS — M65332 Trigger finger, left middle finger: Secondary | ICD-10-CM | POA: Diagnosis not present

## 2022-11-15 DIAGNOSIS — M961 Postlaminectomy syndrome, not elsewhere classified: Secondary | ICD-10-CM

## 2022-11-15 DIAGNOSIS — Z5181 Encounter for therapeutic drug level monitoring: Secondary | ICD-10-CM | POA: Diagnosis not present

## 2022-11-15 DIAGNOSIS — M4807 Spinal stenosis, lumbosacral region: Secondary | ICD-10-CM

## 2022-11-15 DIAGNOSIS — M79642 Pain in left hand: Secondary | ICD-10-CM | POA: Diagnosis not present

## 2022-11-15 MED ORDER — MORPHINE SULFATE ER 15 MG PO TBCR
15.0000 mg | EXTENDED_RELEASE_TABLET | Freq: Three times a day (TID) | ORAL | 0 refills | Status: DC
  Filled 2022-11-15 – 2022-11-16 (×2): qty 90, 30d supply, fill #0

## 2022-11-15 MED ORDER — MORPHINE SULFATE 15 MG PO TABS
15.0000 mg | ORAL_TABLET | Freq: Two times a day (BID) | ORAL | 0 refills | Status: DC | PRN
  Filled 2022-11-15 – 2022-11-16 (×2): qty 60, 30d supply, fill #0

## 2022-11-15 NOTE — Telephone Encounter (Signed)
A Prior Authorization was initiated for this patients LIDOCAINE 5% PATCHES through CoverMyMeds.   Key: [id number]

## 2022-11-15 NOTE — Progress Notes (Signed)
Subjective:    Patient ID: [name] [name], female    DOB: 1955-09-14, 67 y.o.   MRN: [id number]  HPI: [name] [name] is a 67 y.o. female who returns for follow up appointment for chronic pain and medication refill. She states her pain is located in her lower back. She rates her pain 8. Her current exercise regime is walking and performing stretching exercises.    [name] [name] Morphine equivalent is 75.00 MME.   Last UDS was Performed on 08/19/2022, it was consistent.    Pain Inventory Average Pain 8 Pain Right Now 8 My pain is constant, sharp, and aching  In the last 24 hours, has pain interfered with the following? General activity 8 Relation with others 9 Enjoyment of life 9 What TIME of day is your pain at its worst? morning , daytime, and night Sleep (in general) Fair  Pain is worse with: walking, inactivity, and standing Pain improves with: rest, heat/ice, and medication Relief from Meds: 1  Family History  Problem Relation Age of Onset   Hyperlipidemia Mother    Heart attack Mother    Heart disease Mother 33       CHF; CAD   Hypertension Mother    Heart failure Mother    Diabetes Sister    Lupus Sister    Kidney failure Sister    Hyperlipidemia Sister    Hypertension Sister    Stroke Maternal Aunt    Hypertension Brother    Colon cancer Neg Hx    Rectal cancer Neg Hx    Breast cancer Neg Hx    Social History   Socioeconomic History   Marital status: Widowed    Spouse name: Not on file   Number of children: 3   Years of education: Not on file   Highest education level: Associate degree: occupational, Scientist, product/process development, or vocational program  Occupational History   Occupation: disability    Comment: 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  Tobacco Use   Smoking status: Former    Packs/day: 0.50    Years: 20.00    Additional pack years: 0.00    Total pack years: 10.00    Types: Cigarettes    Quit date: 07/21/2008    Years since quitting: 14.3   Smokeless tobacco: Never   Vaping Use   Vaping Use: Never used  Substance and Sexual Activity   Alcohol use: No   Drug use: No   Sexual activity: Not on file  Other Topics Concern   Not on file  Social History Narrative   Marital status: widowed since 2002; not dating which is bad in 2018      Children: 3 children (45, 12, 40); 13 grandchildren; 1 gg      Employment: disability since 2009; retail      Lives: alone in [place] in apartment      Tobacco: quit in 2011      Alcohol: none      Drugs: none      Exercise: minimal in 2018      ADLs: independent with ADLs; drives      Seatbelt: 100%; no texting.   Social Determinants of Health   Financial Resource Strain: Low Risk  (10/24/2022)   Overall Financial Resource Strain (CARDIA)    Difficulty of Paying Living Expenses: Not very hard  Food Insecurity: No Food Insecurity (10/24/2022)   Hunger Vital Sign    Worried About Running Out of Food in the Last Year: Never true  Ran Out of Food in the Last Year: Never true  Transportation Needs: No Transportation Needs (10/24/2022)   PRAPARE - Administrator, Civil Service (Medical): No    Lack of Transportation (Non-Medical): No  Physical Activity: Inactive (10/24/2022)   Exercise Vital Sign    Days of Exercise per Week: 0 days    Minutes of Exercise per Session: 0 min  Stress: Stress Concern Present (10/24/2022)   [employer] of Occupational Health - Occupational Stress Questionnaire    Feeling of Stress : Very much  Social Connections: Moderately Integrated (10/24/2022)   Social Connection and Isolation Panel [NHANES]    Frequency of Communication with Friends and Family: Three times a week    Frequency of Social Gatherings with Friends and Family: Three times a week    Attends Religious Services: More than 4 times per year    Active Member of Clubs or Organizations: Yes    Attends Banker Meetings: More than 4 times per year    Marital Status: Widowed   Past Surgical History:   Procedure Laterality Date   ABDOMINAL HYSTERECTOMY  1982   DUB; ovaries intact   bladder surg  1992   bladder retraction   BREAST BIOPSY     BREAST REDUCTION SURGERY  1984   CARDIAC CATHETERIZATION  1990's   [name]   COLON SURGERY  05/11/2011   [facility] of adhesions Crohn's   MASS EXCISION Left 01/03/2018   Procedure: EXCISION LEFT UPPER BACK LIPOMA ERAS PATH;  Surgeon: Harriette Bouillon, MD;  Location: [facility];  Service: General;  Laterality: Left;   REDUCTION MAMMAPLASTY     SPINE SURGERY     5 total (1 upper, 4 lumbar)   SPINE SURGERY     nerve stimulator on right side   wrist and hand Right 07/2021   Dr. Annell Greening   Past Surgical History:  Procedure Laterality Date   ABDOMINAL HYSTERECTOMY  1982   DUB; ovaries intact   bladder surg  1992   bladder retraction   BREAST BIOPSY     BREAST REDUCTION SURGERY  1984   CARDIAC CATHETERIZATION  1990's   [name]   COLON SURGERY  05/11/2011   [facility] of adhesions Crohn's   MASS EXCISION Left 01/03/2018   Procedure: EXCISION LEFT UPPER BACK LIPOMA ERAS PATH;  Surgeon: Harriette Bouillon, MD;  Location: [facility];  Service: General;  Laterality: Left;   REDUCTION MAMMAPLASTY     SPINE SURGERY     5 total (1 upper, 4 lumbar)   SPINE SURGERY     nerve stimulator on right side   wrist and hand Right 07/2021   Dr. Annell Greening   Past Medical History:  Diagnosis Date   Anemia    Blood transfusion without reported diagnosis    Crohn's disease (HCC)    Disorder of sacroiliac joint    Exertional dyspnea 05/28/2021   Hyperlipidemia    Hypertension    Ischemic colitis (HCC)    Lumbago    Lumbar post-laminectomy syndrome    Lumbosacral neuritis    Lumbosacral radiculitis    OSA (obstructive sleep apnea) 05/28/2021   Sciatica    BP 123/76   Pulse (!) 49   Ht 5\' 4"  (1.626 m)   Wt 216 lb (98 kg)   SpO2 96%   BMI 37.08 kg/m   Opioid Risk Score:   Fall Risk Score:  `1  Depression  screen PHQ  2/9     11/15/2022    9:58 AM 11/10/2022    2:38 PM 10/27/2022    1:29 PM 10/24/2022    2:04 PM 10/19/2022    8:18 AM 09/27/2022    8:22 AM 09/19/2022    8:24 AM  Depression screen PHQ 2/9  Decreased Interest 0 2 1 3  0 0 0  Down, Depressed, Hopeless 0 0 0 1 0 0 0  PHQ - 2 Score 0 2 1 4  0 0 0  Altered sleeping  1 0   0   Tired, decreased energy  1 1   1    Change in appetite  2 2   3    Feeling bad or failure about yourself   0 0   0   Trouble concentrating  0 0   0   Moving slowly or fidgety/restless  0 0   0   Suicidal thoughts  0 0   0   PHQ-9 Score  6 4   4    Difficult doing work/chores  Somewhat difficult Not difficult at all        Review of Systems  Musculoskeletal:  Positive for back pain.       Right shoulder pain  All other systems reviewed and are negative.     Objective:   Physical Exam Vitals and nursing note reviewed.  Constitutional:      Appearance: Normal appearance.  Cardiovascular:     Rate and Rhythm: Regular rhythm. Bradycardia present.     Pulses: Normal pulses.     Heart sounds: Normal heart sounds.  Pulmonary:     Effort: Pulmonary effort is normal.     Breath sounds: Normal breath sounds.  Musculoskeletal:     Cervical back: Normal range of motion and neck supple.     Comments: Normal Muscle Bulk and Muscle Testing Reveals:  Upper Extremities: Full ROM and Muscle Strength on Right 4/5 and Left 5/5 Wearing Right Wrist Splint  Lumbar Paraspinal Tenderness: L-4-L-5 Lower Extremities: Full ROM and Muscle Strength 5/5 Arises from Table with Ease Narrow Based  Gait     Skin:    General: Skin is warm and dry.  Neurological:     Mental Status: She is alert and oriented to person, place, and time.  Psychiatric:        Mood and Affect: Mood normal.        Behavior: Behavior normal.         Assessment & Plan:  1.Lumbar Postlaminectomy/ Spinal Stenosis Lumbar Region/ Post-Op Pain/Low back pain with radiating symptoms in L3-4 distribution:   continues to be limited by pain. Refilled:  MSIR 15 mg one tablet twice a day as needed for moderate to sever pain #60 and  Morphine Sulfate ER 15 mg Q 8 hours. 11/15/2022. We will continue the opioid monitoring program, this consists of regular clinic visits, examinations, urine drug screen, pill counts as well as use of [place] Controlled Substance Reporting system. A 12 month History has been reviewed on the [place] Controlled Substance Reporting System on 11/15/2022. Encouraged to Continue exercise regime.   2. Lumbar Radiculopathy: No complaints today. Continue Topamax:.Continue to Monitor 11/15/2022 3.Chronic  Right Knee Pain: No complaints today.  Continue with Ice Therapy and  Voltaren Gel. 11/15/2022 4. Muscle Spasm: Continue current treatment: Tizanidine. 11/15/2022  5. Lipoma: S/P Left  Lipoma Excision of upper back  By Dr. Luisa Hart on 01/03/2018. Surgery Following. Continue to Monitor. 11/15/2022.   6. Paresthesia of Skin:  Continue Topamax. Continue to monitor. 11/15/2022 7. Adhesive Capsulitis of Right Shoulder:Right Shoulder Tendonitis Pain:  No complaints today. S/P Right Shoulder Cortisone Injection with Dr Wynn Banker, with good relief noted. Continue to monitor. Continue HEP as Tolerated. 11/15/2022  8. Polyarthralgia: Continue HEP as Tolerated. Continue to Monitor.11/15/2022  9. Sacral Pain: No complaints today. Continue to Monitor. 11/15/2022 10. Right Hand Pain: S/P :Post right middle finger trigger finger release carpal tunnel release. Dr Ophelia Charter Following.  Continue to monitor. 11/15/2022  11. Left hand Pain Left Index Trigger Finger: Dr Ophelia Charter following. Note was reviewed.  12. Bradycardia: Apical pulse checked. [name] [name] was instructed to keep vital  log she will F/U with her Cardiologist. She verbalizes understanding. 11/15/2022   F/U in 1 month

## 2022-11-15 NOTE — Progress Notes (Signed)
Office Visit Note   Patient: [name] [name]           Date of Birth: 1956/04/07           MRN: [id number] Visit Date: 11/15/2022              Requested by: Levin Erp, MD [address] [place],  [place] [address] PCP: Levin Erp, MD   Assessment & Plan: Visit Diagnoses:  1. Trigger finger, left index finger   2.      Postop right carpal tunnel release and right middle finger trigger finger release.  Plan: Buddy tape long finger and ring finger right hand to help work on flexion extension.  Recheck 2 months.  If she is having persistent triggering left index finger she can decide on scheduling surgical release as an outpatient.  Follow-Up Instructions: Return in about 2 months (around 01/15/2023).   Orders:  No orders of the defined types were placed in this encounter.  No orders of the defined types were placed in this encounter.     Procedures: No procedures performed   Clinical Data: No additional findings.   Subjective: Chief Complaint  Patient presents with   Right Hand - Follow-up    08/08/2022 Right CTR, Right Middle Finger Trigger Finger Release   Left Hand - Follow-up    HPI follow-up right carpal tunnel release and right middle finger A1 pulley trigger finger release.  Her middle finger of the right hand is still little bit stiff.  She has been wearing the wrist splint now for almost 3 months she needs to discontinue the wrist splint.  Buddy tape over the nail long finger to ring finger and with that she is able to flex to distal palmar crease still has a little bit of stiffness but no catching.  She needs to loosen the finger up and she had trigger finger for several months before she had surgical release.  Opposite right index finger is catching intermittently.  She has a splint she can wear over the DIP joint dorsally and will return in 2 months if she still having catching with the left index finger she can decide if she wants to proceed with  trigger finger release left index finger.  Review of Systems   Objective: Vital Signs: BP (!) 162/67   Pulse 67   Ht 5\' 4"  (1.626 m)   Wt 227 lb (103 kg)   BMI 38.96 kg/m   Physical Exam unchanged  Ortho Exam well-healed carpal tunnel incision.  A1 pulley release middle finger intact.  Stiffness and mild discomfort with flexion to distal palmar crease but no catching.  Opposite left index finger does demonstrate some partial catching and tenderness over the A1 pulley.  Specialty Comments:  No specialty comments available.  Imaging: No results found.   PMFS History: Patient Active Problem List   Diagnosis Date Noted   Trigger finger, left index finger 10/18/2022   Diabetes mellitus without complication (HCC) 09/27/2022   Fatigue 07/28/2022   Osteopenia determined by x-ray 07/28/2022   Chondromalacia patellae, left knee 07/22/2022   Carpal tunnel syndrome, right upper limb 07/22/2022   Carpal tunnel syndrome, left upper limb 02/25/2022   Hx of fusion of cervical spine 12/14/2021   Cervical radiculopathy 11/09/2021   Trigger finger, right middle finger 11/09/2021   Lung nodules 11/09/2021   OSA (obstructive sleep apnea) 05/28/2021   Exertional dyspnea 05/28/2021   Bradycardia 12/27/2020   Lower extremity edema 11/10/2019  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   Allergic rhinitis 04/15/2019   Chronic pain 04/15/2019   Memory difficulties 04/15/2019   Pre-diabetes 04/15/2019   Class 2 severe obesity due to excess calories with serious comorbidity and body mass index (BMI) of 36.0 to 36.9 in adult [facility]) 04/04/2017   Degeneration of lumbar intervertebral disc 11/28/2014   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/28/2014   History of lumbar fusion 11/28/2014   Hypokalemia 11/13/2014   Neuropathy 11/13/2014   Primary insomnia 11/13/2014   Postlaminectomy syndrome, cervical region 12/09/2013   Heart palpitations 10/19/2012   Dizziness 04/22/2012    Atypical chest pain 04/09/2012   Hyperlipemia 04/09/2012   Cervical pain 01/26/2012   Postlaminectomy syndrome, lumbar region 08/11/2011   Crohn's disease (HCC)    Essential hypertension 09/02/2008   Past Medical History:  Diagnosis Date   Anemia    Blood transfusion without reported diagnosis    Crohn's disease (HCC)    Disorder of sacroiliac joint    Exertional dyspnea 05/28/2021   Hyperlipidemia    Hypertension    Ischemic colitis (HCC)    Lumbago    Lumbar post-laminectomy syndrome    Lumbosacral neuritis    Lumbosacral radiculitis    OSA (obstructive sleep apnea) 05/28/2021   Sciatica     Family History  Problem Relation Age of Onset   Hyperlipidemia Mother    Heart attack Mother    Heart disease Mother 42       CHF; CAD   Hypertension Mother    Heart failure Mother    Diabetes Sister    Lupus Sister    Kidney failure Sister    Hyperlipidemia Sister    Hypertension Sister    Stroke Maternal Aunt    Hypertension Brother    Colon cancer Neg Hx    Rectal cancer Neg Hx    Breast cancer Neg Hx     Past Surgical History:  Procedure Laterality Date   ABDOMINAL HYSTERECTOMY  1982   DUB; ovaries intact   bladder surg  1992   bladder retraction   BREAST BIOPSY     BREAST REDUCTION SURGERY  1984   CARDIAC CATHETERIZATION  1990's   [name]   COLON SURGERY  05/11/2011   [facility] of adhesions Crohn's   MASS EXCISION Left 01/03/2018   Procedure: EXCISION LEFT UPPER BACK LIPOMA ERAS PATH;  Surgeon: Harriette Bouillon, MD;  Location: [facility];  Service: General;  Laterality: Left;   REDUCTION MAMMAPLASTY     SPINE SURGERY     5 total (1 upper, 4 lumbar)   SPINE SURGERY     nerve stimulator on right side   wrist and hand Right 07/2021   Dr. Annell Greening   Social History   Occupational History   Occupation: disability    Comment: 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  Tobacco Use   Smoking status: Former    Packs/day: 0.50    Years: 20.00    Additional  pack years: 0.00    Total pack years: 10.00    Types: Cigarettes    Quit date: 07/21/2008    Years since quitting: 14.3   Smokeless tobacco: Never  Vaping Use   Vaping Use: Never used  Substance and Sexual Activity   Alcohol use: No   Drug use: No   Sexual activity: Not on file

## 2022-11-16 ENCOUNTER — Other Ambulatory Visit: Payer: Self-pay

## 2022-11-16 ENCOUNTER — Ambulatory Visit: Payer: 59 | Admitting: Registered Nurse

## 2022-11-17 NOTE — Telephone Encounter (Signed)
Prior Auth for patients medication LIDOCAINE 5% PATCHES approved by [facility] MEDICARE from 11/15/22/to 06/20/23.  CoverMyMeds Key: [id number] PA Case ID #: [id number]

## 2022-11-18 ENCOUNTER — Other Ambulatory Visit: Payer: Self-pay | Admitting: Student

## 2022-11-18 DIAGNOSIS — I10 Essential (primary) hypertension: Secondary | ICD-10-CM

## 2022-11-24 ENCOUNTER — Ambulatory Visit: Payer: 59

## 2022-11-25 ENCOUNTER — Other Ambulatory Visit: Payer: Self-pay

## 2022-11-25 MED FILL — Diclofenac Sodium Gel 1% (1.16% Diethylamine Equiv): CUTANEOUS | 20 days supply | Qty: 200 | Fill #0 | Status: AC

## 2022-11-29 ENCOUNTER — Other Ambulatory Visit: Payer: Self-pay

## 2022-12-06 ENCOUNTER — Ambulatory Visit: Payer: 59 | Admitting: Student

## 2022-12-06 NOTE — Progress Notes (Deleted)
    SUBJECTIVE:   CHIEF COMPLAINT / HPI:      11/15/2022    9:58 AM 11/10/2022    2:38 PM 10/27/2022    1:29 PM 10/24/2022    2:04 PM 10/19/2022    8:18 AM  Depression screen PHQ 2/9  Decreased Interest 0 2 1 3  0  Down, Depressed, Hopeless 0 0 0 1 0  PHQ - 2 Score 0 2 1 4  0  Altered sleeping  1 0    Tired, decreased energy  1 1    Change in appetite  2 2    Feeling bad or failure about yourself   0 0    Trouble concentrating  0 0    Moving slowly or fidgety/restless  0 0    Suicidal thoughts  0 0    PHQ-9 Score  6 4    Difficult doing work/chores  Somewhat difficult Not difficult at all      Hypertension: Patient is a 67 y.o. female who present today for follow up of hypertension.   Patient endorses {rwdmsmartlistproblems:24882}  Home medications include: chlorthalidone 25 mg daily, metoprolol 12.5 mg daily, losartan 12.5 mg daily  Patient endorses taking these medications as prescribed.*** Denies any headache, vision changes, shortness of breath, lower extremity swelling or chest pain   Most recent creatinine trend:  Lab Results  Component Value Date   CREATININE 1.03 (H) 11/10/2022   CREATININE 0.95 09/27/2022   CREATININE 1.05 (H) 04/14/2022   Patient {rwdoesdoesnot:24881} check blood pressure at home.  Patient {HAS HAS UXL:24401} had a BMP in the past 1 year.  PERTINENT  PMH / PSH: ***  OBJECTIVE:   There were no vitals taken for this visit.  ***  ASSESSMENT/PLAN:   No problem-specific Assessment & Plan notes found for this encounter.     Levin Erp, MD [facility]

## 2022-12-08 ENCOUNTER — Other Ambulatory Visit: Payer: Self-pay | Admitting: Student

## 2022-12-08 DIAGNOSIS — J301 Allergic rhinitis due to pollen: Secondary | ICD-10-CM

## 2022-12-12 ENCOUNTER — Ambulatory Visit (INDEPENDENT_AMBULATORY_CARE_PROVIDER_SITE_OTHER): Payer: 59 | Admitting: Student

## 2022-12-12 ENCOUNTER — Other Ambulatory Visit: Payer: Self-pay

## 2022-12-12 ENCOUNTER — Encounter: Payer: Self-pay | Admitting: Student

## 2022-12-12 VITALS — BP 136/74 | HR 89 | Ht 64.0 in | Wt 221.6 lb

## 2022-12-12 DIAGNOSIS — I10 Essential (primary) hypertension: Secondary | ICD-10-CM | POA: Diagnosis not present

## 2022-12-12 DIAGNOSIS — F320 Major depressive disorder, single episode, mild: Secondary | ICD-10-CM | POA: Diagnosis not present

## 2022-12-12 MED ORDER — LIDOCAINE 5 % EX PTCH
1.0000 | MEDICATED_PATCH | CUTANEOUS | 0 refills | Status: DC
  Filled 2022-12-12: qty 30, 30d supply, fill #0

## 2022-12-12 MED ORDER — ESCITALOPRAM OXALATE 5 MG PO TABS
5.0000 mg | ORAL_TABLET | Freq: Every day | ORAL | 0 refills | Status: DC
  Filled 2022-12-12: qty 30, 30d supply, fill #0

## 2022-12-12 NOTE — Assessment & Plan Note (Signed)
Doing okay at 136/74 after decreasing losartan at last visit. -Continue to monitor BP on current regimen

## 2022-12-12 NOTE — Progress Notes (Signed)
    SUBJECTIVE:   CHIEF COMPLAINT / HPI: Depression and BP Follow up  Depressed mood    12/12/2022    2:44 PM 11/15/2022    9:58 AM 11/10/2022    2:38 PM 10/27/2022    1:29 PM 10/24/2022    2:04 PM  Depression screen PHQ 2/9  Decreased Interest 3 0 2 1 3   Down, Depressed, Hopeless 1 0 0 0 1  PHQ - 2 Score 4 0 2 1 4   Altered sleeping 0  1 0   Tired, decreased energy 1  1 1    Change in appetite 3  2 2    Feeling bad or failure about yourself  0  0 0   Trouble concentrating 0  0 0   Moving slowly or fidgety/restless 0  0 0   Suicidal thoughts 0  0 0   PHQ-9 Score 8  6 4    Difficult doing work/chores Extremely dIfficult  Somewhat difficult Not difficult at all   States that she has been having a lot of issues in terms of her social situation as she has chronic pain related to her back and it hurts a lot to get to her first story apartment.  Was previously in touch with the social workers through [facility] referral however has not discussed with them since October.  Not wanting a referral again today.  She is currently on the section 8 waiting list and just has a lot of stress regarding her situation.  Dates that she is starting to fall onto depression and would be interested in some medications to help   Hypertension: Patient is a 67 y.o. female who present today for follow up of hypertension. No dizziness or    Patient endorses no problems    Home medications include: chlorthalidone 25 mg daily, metoprolol 12.5 mg daily, losartan 12.5 mg daily (reduced at last visit due to low diastolics and some dizziness/tiredness which is now resolved) Patient endorses taking these medications as prescribed.   Most recent creatinine trend:  Recent Labs       Lab Results  Component Value Date    CREATININE 1.03 (H) 11/10/2022    CREATININE 0.95 09/27/2022    CREATININE 1.05 (H) 04/14/2022      PERTINENT  PMH / PSH:   OBJECTIVE:   BP 136/74   Pulse 89   Ht 5\' 4"  (1.626 m)   Wt 221 lb 9.6 oz  (100.5 kg)   SpO2 98%   BMI 38.04 kg/m   General: Well appearing, NAD, awake, alert, responsive to questions Head: Normocephalic atraumatic CV: Regular rate and rhythm no murmurs rubs or gallops Respiratory: Clear to ausculation bilaterally, no wheezes rales or crackles, chest rises symmetrically,  no increased work of breathing  ASSESSMENT/PLAN:   Depression, major, single episode, mild (HCC) Seems to be related to her living situation and chronic pain.  She declines a CCM referral or therapy resources today.  She would like to start with a low-dose SSRI.  Previously has been on Lexapro in 2015 and is willing to try this again. -Lexapro 5 mg daily -1 month follow-up virtually or in person  Essential hypertension Doing okay at 136/74 after decreasing losartan at last visit. -Continue to monitor BP on current regimen   Levin Erp, MD [facility]

## 2022-12-12 NOTE — Assessment & Plan Note (Signed)
Seems to be related to her living situation and chronic pain.  She declines a CCM referral or therapy resources today.  She would like to start with a low-dose SSRI.  Previously has been on Lexapro in 2015 and is willing to try this again. -Lexapro 5 mg daily -1 month follow-up virtually or in person

## 2022-12-12 NOTE — Patient Instructions (Signed)
It was great to see you! Thank you for allowing me to participate in your care!   Our plans for today:  - lexapro 5 mg daily sent to your pharmacy - follow up in 1 month in person or virtually   Take care and seek immediate care sooner if you develop any concerns.  Levin Erp, MD

## 2022-12-15 ENCOUNTER — Encounter: Payer: Self-pay | Admitting: Registered Nurse

## 2022-12-15 ENCOUNTER — Encounter: Payer: 59 | Attending: Physical Medicine & Rehabilitation | Admitting: Registered Nurse

## 2022-12-15 ENCOUNTER — Other Ambulatory Visit: Payer: Self-pay

## 2022-12-15 VITALS — BP 134/87 | HR 61 | Ht 64.0 in | Wt 223.8 lb

## 2022-12-15 DIAGNOSIS — G894 Chronic pain syndrome: Secondary | ICD-10-CM | POA: Diagnosis not present

## 2022-12-15 DIAGNOSIS — M961 Postlaminectomy syndrome, not elsewhere classified: Secondary | ICD-10-CM | POA: Diagnosis not present

## 2022-12-15 DIAGNOSIS — Z5181 Encounter for therapeutic drug level monitoring: Secondary | ICD-10-CM | POA: Insufficient documentation

## 2022-12-15 DIAGNOSIS — M4807 Spinal stenosis, lumbosacral region: Secondary | ICD-10-CM | POA: Insufficient documentation

## 2022-12-15 DIAGNOSIS — Z79891 Long term (current) use of opiate analgesic: Secondary | ICD-10-CM | POA: Insufficient documentation

## 2022-12-15 MED ORDER — MORPHINE SULFATE ER 15 MG PO TBCR
15.0000 mg | EXTENDED_RELEASE_TABLET | Freq: Three times a day (TID) | ORAL | 0 refills | Status: DC
  Filled 2022-12-15: qty 90, 30d supply, fill #0

## 2022-12-15 MED ORDER — MORPHINE SULFATE 15 MG PO TABS
15.0000 mg | ORAL_TABLET | Freq: Two times a day (BID) | ORAL | 0 refills | Status: DC | PRN
  Filled 2022-12-15: qty 60, 30d supply, fill #0

## 2022-12-15 NOTE — Progress Notes (Signed)
Subjective:    Patient ID: [name] [name], female    DOB: Jul 09, 1955, 67 y.o.   MRN: [id number]  HPI: [name] [name] is a 67 y.o. female who returns for follow up appointment for chronic pain and medication refill. She states her pain is located in her lower back. She rates her pain 9. Her current exercise regime is walking and performing stretching exercises.  Ms. Loftis Morphine equivalent is 75.00 MME.   Last UDS was Performed on 08/19/2022, it was consistent.     Pain Inventory Average Pain 9 Pain Right Now 9 My pain is constant and sharp  In the last 24 hours, has pain interfered with the following? General activity 9 Relation with others 9 Enjoyment of life 9 What TIME of day is your pain at its worst? morning , daytime, evening, and night Sleep (in general) Fair  Pain is worse with: walking, sitting, inactivity, and standing Pain improves with: rest, heat/ice, and medication Relief from Meds: 1  Family History  Problem Relation Age of Onset   Hyperlipidemia Mother    Heart attack Mother    Heart disease Mother 3       CHF; CAD   Hypertension Mother    Heart failure Mother    Diabetes Sister    Lupus Sister    Kidney failure Sister    Hyperlipidemia Sister    Hypertension Sister    Stroke Maternal Aunt    Hypertension Brother    Colon cancer Neg Hx    Rectal cancer Neg Hx    Breast cancer Neg Hx    Social History   Socioeconomic History   Marital status: Widowed    Spouse name: Not on file   Number of children: 3   Years of education: Not on file   Highest education level: Associate degree: occupational, Scientist, product/process development, or vocational program  Occupational History   Occupation: disability    Comment: 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  Tobacco Use   Smoking status: Former    Packs/day: 0.50    Years: 20.00    Additional pack years: 0.00    Total pack years: 10.00    Types: Cigarettes    Quit date: 07/21/2008    Years since quitting: 14.4   Smokeless tobacco:  Never  Vaping Use   Vaping Use: Never used  Substance and Sexual Activity   Alcohol use: No   Drug use: No   Sexual activity: Not on file  Other Topics Concern   Not on file  Social History Narrative   Marital status: widowed since 2002; not dating which is bad in 2018      Children: 3 children (45, 67, 40); 13 grandchildren; 1 gg      Employment: disability since 2009; retail      Lives: alone in [place] in apartment      Tobacco: quit in 2011      Alcohol: none      Drugs: none      Exercise: minimal in 2018      ADLs: independent with ADLs; drives      Seatbelt: 100%; no texting.   Social Determinants of Health   Financial Resource Strain: Low Risk  (10/24/2022)   Overall Financial Resource Strain (CARDIA)    Difficulty of Paying Living Expenses: Not very hard  Food Insecurity: No Food Insecurity (10/24/2022)   Hunger Vital Sign    Worried About Running Out of Food in the Last Year: Never true  Ran Out of Food in the Last Year: Never true  Transportation Needs: No Transportation Needs (10/24/2022)   PRAPARE - Administrator, Civil Service (Medical): No    Lack of Transportation (Non-Medical): No  Physical Activity: Inactive (10/24/2022)   Exercise Vital Sign    Days of Exercise per Week: 0 days    Minutes of Exercise per Session: 0 min  Stress: Stress Concern Present (10/24/2022)   [employer] of Occupational Health - Occupational Stress Questionnaire    Feeling of Stress : Very much  Social Connections: Moderately Integrated (10/24/2022)   Social Connection and Isolation Panel [NHANES]    Frequency of Communication with Friends and Family: Three times a week    Frequency of Social Gatherings with Friends and Family: Three times a week    Attends Religious Services: More than 4 times per year    Active Member of Clubs or Organizations: Yes    Attends Banker Meetings: More than 4 times per year    Marital Status: Widowed   Past Surgical  History:  Procedure Laterality Date   ABDOMINAL HYSTERECTOMY  1982   DUB; ovaries intact   bladder surg  1992   bladder retraction   BREAST BIOPSY     BREAST REDUCTION SURGERY  1984   CARDIAC CATHETERIZATION  1990's   [name]   COLON SURGERY  05/11/2011   [facility] of adhesions Crohn's   MASS EXCISION Left 01/03/2018   Procedure: EXCISION LEFT UPPER BACK LIPOMA ERAS PATH;  Surgeon: Harriette Bouillon, MD;  Location: [facility];  Service: General;  Laterality: Left;   REDUCTION MAMMAPLASTY     SPINE SURGERY     5 total (1 upper, 4 lumbar)   SPINE SURGERY     nerve stimulator on right side   wrist and hand Right 07/2021   Dr. Annell Greening   Past Surgical History:  Procedure Laterality Date   ABDOMINAL HYSTERECTOMY  1982   DUB; ovaries intact   bladder surg  1992   bladder retraction   BREAST BIOPSY     BREAST REDUCTION SURGERY  1984   CARDIAC CATHETERIZATION  1990's   [name]   COLON SURGERY  05/11/2011   [facility] of adhesions Crohn's   MASS EXCISION Left 01/03/2018   Procedure: EXCISION LEFT UPPER BACK LIPOMA ERAS PATH;  Surgeon: Harriette Bouillon, MD;  Location: [facility];  Service: General;  Laterality: Left;   REDUCTION MAMMAPLASTY     SPINE SURGERY     5 total (1 upper, 4 lumbar)   SPINE SURGERY     nerve stimulator on right side   wrist and hand Right 07/2021   Dr. Annell Greening   Past Medical History:  Diagnosis Date   Anemia    Blood transfusion without reported diagnosis    Crohn's disease (HCC)    Disorder of sacroiliac joint    Exertional dyspnea 05/28/2021   Hyperlipidemia    Hypertension    Ischemic colitis (HCC)    Lumbago    Lumbar post-laminectomy syndrome    Lumbosacral neuritis    Lumbosacral radiculitis    OSA (obstructive sleep apnea) 05/28/2021   Sciatica    There were no vitals taken for this visit.  Opioid Risk Score:   Fall Risk Score:  `1  Depression screen [facility] 2/9     12/15/2022   10:21 AM  12/12/2022    2:44 PM 11/15/2022    9:58 AM 11/10/2022  2:38 PM 10/27/2022    1:29 PM 10/24/2022    2:04 PM 10/19/2022    8:18 AM  Depression screen PHQ 2/9  Decreased Interest 0 3 0 2 1 3  0  Down, Depressed, Hopeless 0 1 0 0 0 1 0  PHQ - 2 Score 0 4 0 2 1 4  0  Altered sleeping  0  1 0    Tired, decreased energy  1  1 1     Change in appetite  3  2 2     Feeling bad or failure about yourself   0  0 0    Trouble concentrating  0  0 0    Moving slowly or fidgety/restless  0  0 0    Suicidal thoughts  0  0 0    PHQ-9 Score  8  6 4     Difficult doing work/chores  Extremely dIfficult  Somewhat difficult Not difficult at all       Review of Systems  Constitutional: Negative.   HENT: Negative.    Eyes: Negative.   Respiratory: Negative.    Cardiovascular: Negative.   Gastrointestinal: Negative.   Endocrine: Negative.   Genitourinary: Negative.   Musculoskeletal:  Positive for back pain.       Right shoulder and knee pain  Skin: Negative.   Allergic/Immunologic: Negative.   Neurological: Negative.   Hematological: Negative.   Psychiatric/Behavioral: Negative.    All other systems reviewed and are negative.      Objective:   Physical Exam Vitals and nursing note reviewed.  Constitutional:      Appearance: Normal appearance.  Cardiovascular:     Rate and Rhythm: Normal rate and regular rhythm.     Pulses: Normal pulses.     Heart sounds: Normal heart sounds.  Pulmonary:     Effort: Pulmonary effort is normal.     Breath sounds: Normal breath sounds.  Musculoskeletal:     Cervical back: Normal range of motion and neck supple.     Comments: Normal Muscle Bulk and Muscle Testing Reveals:  Upper Extremities: Full ROM and Muscle Strength 5/5 Lumbar Paraspinal Tenderness: L-4-L-5 Lower Extremities: Full ROM and Muscle Strength 5/5 Arises from Table with Ease Narrow Based  Gait     Skin:    General: Skin is warm and dry.  Neurological:     Mental Status: She is alert and  oriented to person, place, and time.  Psychiatric:        Mood and Affect: Mood normal.        Behavior: Behavior normal.         Assessment & Plan:  1.Lumbar Postlaminectomy/ Spinal Stenosis Lumbar Region/ Post-Op Pain/Low back pain with radiating symptoms in L3-4 distribution:  continues to be limited by pain. Refilled:  MSIR 15 mg one tablet twice a day as needed for moderate to sever pain #60 and  Morphine Sulfate ER 15 mg Q 8 hours. 12/15/2022. We will continue the opioid monitoring program, this consists of regular clinic visits, examinations, urine drug screen, pill counts as well as use of [place] Controlled Substance Reporting system. A 12 month History has been reviewed on the [place] Controlled Substance Reporting System on 11/15/2022. Encouraged to Continue exercise regime.   2. Lumbar Radiculopathy: No complaints today. Continue Topamax:.Continue to Monitor 12/15/2022 3.Chronic  Right Knee Pain: No complaints today.  Continue with Ice Therapy and  Voltaren Gel. 12/15/2022 4. Muscle Spasm: Continue current treatment: Tizanidine. 12/15/2022  5. Lipoma: S/P Left  Lipoma Excision of upper back  By Dr. Luisa Hart on 01/03/2018. Surgery Following. Continue to Monitor. 12/15/2022.   6. Paresthesia of Skin: Continue Topamax. Continue to monitor. 12/15/2022 7. Adhesive Capsulitis of Right Shoulder:Right Shoulder Tendonitis Pain:  No complaints today. S/P Right Shoulder Cortisone Injection with Dr Wynn Banker, with good relief noted. Continue to monitor. Continue HEP as Tolerated. 12/15/2022  8. Polyarthralgia: Continue HEP as Tolerated. Continue to Monitor.12/15/2022  9. Sacral Pain: No complaints today. Continue to Monitor. 12/15/2022 10. Right Hand Pain: S/P :Post right middle finger trigger finger release carpal tunnel release. Dr Ophelia Charter Following.  Continue to monitor. 12/15/2022  11. Left hand Pain Left Index Trigger Finger: Dr Ophelia Charter following. Note was reviewed.    F/U in 1  month

## 2022-12-18 ENCOUNTER — Other Ambulatory Visit: Payer: Self-pay | Admitting: Physical Medicine & Rehabilitation

## 2022-12-20 ENCOUNTER — Ambulatory Visit (HOSPITAL_BASED_OUTPATIENT_CLINIC_OR_DEPARTMENT_OTHER): Payer: 59 | Admitting: Family

## 2022-12-20 ENCOUNTER — Encounter (HOSPITAL_BASED_OUTPATIENT_CLINIC_OR_DEPARTMENT_OTHER): Payer: Self-pay

## 2022-12-20 NOTE — Progress Notes (Deleted)
Cardiology Office Note:  .   Date:  12/20/2022  ID:  [name] [name], DOB 1955-08-31, MRN [id number] PCP: Levin Erp, MD  [facility] Providers Cardiologist:  Chilton Si, MD { Click to update primary MD,subspecialty MD or APP then REFRESH:1}   History of Present Illness: .   [name] is a 67 y.o. female with a hx of anemia, hyperlipidemia, hypertension, sciatica, palpitations, lung nodules, bradycardia last seen 04/14/22.   Seen in consult by Dr. Duke Salvia for palpitations and bradycardia.  She was seen by primary care since 7/22 and noted to have heart rate in the 50s and 60s as well as dizziness.  EKG showed sinus bradycardia and inferior lateral T wave inversion.  Previous echocardiogram May 2021 LVEF 55 to 60% with grade 1 diastolic dysfunction and moderate LVH.  Nuclear stress test December 2017 negative for ischemia.    Echocardiogram 01/13/2021 LVEF 55-60%, no R WMA, mild to moderate LVH, grade 1 diastolic dysfunction, trivial MR.  Carotid duplex 01/13/2021 with bilateral carotid arteries with only minimal thickening or plaque.  ZIO monitor 01/08/2021 with baseline sinus rhythm with average heart rate 67 bpm.  No pauses greater than 2.5 seconds.  There were 5 episodes of SVT with heart rate 26 bpm.  Noted occasional PVCs and ventricular bigeminy and trigeminy.  She had sleep study 03/2021 which noted some hypoxic events.  She was started on metoprolol for palpitations.  Nuclear stress test 05/2021 was low risk.   Last seen by Dr. Duke Salvia 04/14/2022 with BP well-controlled on chlorthalidone and metoprolol.  She had atypical chest pain both at rest and with stress with subsequent coronary CTA *** 04/28/22 coronary calcium score of 157 with minimal (0-24%) stenosis in LAD and RCA placing her in the 99th percentile for age, sex, race matched controls.  Atorvastatin was increased to 40 mg daily and aspirin initiated.   She presents today for follow-up. She is very  involved in her church, her nephew is actually the pastor. ***  ROS: Please see the history of present illness.    All other systems reviewed and are negative.   Studies Reviewed: .        Cardiac Studies & Procedures     STRESS TESTS  MYOCARDIAL PERFUSION IMAGING 06/16/2021  Narrative   The study is normal. The study is low risk.   No ST deviation was noted.   LV perfusion is normal. There is no evidence of ischemia. There is no evidence of infarction.   Left ventricular function is normal. Nuclear stress EF: 67 %. The left ventricular ejection fraction is hyperdynamic (>65%). End diastolic cavity size is normal.   Prior study available for comparison from 05/25/2016.  Normal study without evidence of ischemia or infarction. Normal LVEF, >65%. This is a low-risk study.   ECHOCARDIOGRAM  ECHOCARDIOGRAM COMPLETE 01/13/2021  Narrative ECHOCARDIOGRAM REPORT    Patient Name:   [name] [name] Date of Exam: 01/13/2021 Medical Rec #:  [id number]          Height:       64.0 in Accession #:    [id number]         Weight:       216.4 lb Date of Birth:  01-Jan-1956          BSA:          2.023 m Patient Age:    65 years           BP:  140/72 mmHg Patient Gender: F                  HR:           54 bpm. Exam Location:  Outpatient  Procedure: 2D Echo  Indications:    Abnormal ECG R94.31; Palpitations R00.2  History:        Patient has prior history of Echocardiogram examinations, most recent 11/11/2019. Risk Factors:Hypertension and Dyslipidemia.  Sonographer:    Thurman Coyer RDCS (AE) Referring Phys: [id number] Theador Hawthorne ENIOLA  IMPRESSIONS   1. Left ventricular ejection fraction, by estimation, is 55 to 60%. The left ventricle has normal function. The left ventricle has no regional wall motion abnormalities. There is mild-to-moderate concentric left ventricular hypertrophy. Left ventricular diastolic parameters are consistent with Grade I diastolic dysfunction  (impaired relaxation). 2. Right ventricular systolic function is normal. The right ventricular size is normal. 3. The mitral valve is normal in structure. Trivial mitral valve regurgitation. No evidence of mitral stenosis. 4. The aortic valve was not well visualized. Aortic valve regurgitation is not visualized. No aortic stenosis is present. 5. The inferior vena cava is normal in size with greater than 50% respiratory variability, suggesting right atrial pressure of 3 mmHg.  Comparison(s): Compared to prior echo on 11/11/19, there is no significant change.  FINDINGS Left Ventricle: Left ventricular ejection fraction, by estimation, is 55 to 60%. The left ventricle has normal function. The left ventricle has no regional wall motion abnormalities. The left ventricular internal cavity size was normal in size. There is mild-to-moderate concentric left ventricular hypertrophy. Left ventricular diastolic parameters are consistent with Grade I diastolic dysfunction (impaired relaxation).  Right Ventricle: The right ventricular size is normal. Right vetricular wall thickness was not well visualized. Right ventricular systolic function is normal.  Left Atrium: Left atrial size was normal in size.  Right Atrium: Right atrial size was normal in size.  Pericardium: There is no evidence of pericardial effusion.  Mitral Valve: The mitral valve is normal in structure. There is mild thickening of the mitral valve leaflet(s). There is mild calcification of the mitral valve leaflet(s). Mild mitral annular calcification. Trivial mitral valve regurgitation. No evidence of mitral valve stenosis.  Tricuspid Valve: The tricuspid valve is normal in structure. Tricuspid valve regurgitation is trivial.  Aortic Valve: The aortic valve was not well visualized. Aortic valve regurgitation is not visualized. No aortic stenosis is present.  Pulmonic Valve: The pulmonic valve was not well visualized. Pulmonic valve  regurgitation is not visualized.  Aorta: The aortic root is normal in size and structure.  Venous: The inferior vena cava is normal in size with greater than 50% respiratory variability, suggesting right atrial pressure of 3 mmHg.  IAS/Shunts: No atrial level shunt detected by color flow Doppler.   LEFT VENTRICLE PLAX 2D LVIDd:         4.60 cm  Diastology LVIDs:         3.10 cm  LV e' medial:    8.38 cm/s LV PW:         1.00 cm  LV E/e' medial:  9.0 LV IVS:        0.90 cm  LV e' lateral:   12.10 cm/s LVOT diam:     2.30 cm  LV E/e' lateral: 6.2 LV SV:         103 LV SV Index:   51 LVOT Area:     4.15 cm   RIGHT VENTRICLE RV S prime:  10.10 cm/s TAPSE (M-mode): 1.8 cm  LEFT ATRIUM             Index       RIGHT ATRIUM           Index LA diam:        3.20 cm 1.58 cm/m  RA Area:     10.90 cm LA Vol (A2C):   38.1 ml 18.83 ml/m RA Volume:   20.60 ml  10.18 ml/m LA Vol (A4C):   46.5 ml 22.99 ml/m LA Biplane Vol: 45.6 ml 22.54 ml/m AORTIC VALVE LVOT Vmax:   99.70 cm/s LVOT Vmean:  65.100 cm/s LVOT VTI:    0.247 m  AORTA Ao Root diam: 2.70 cm  MITRAL VALVE MV Area (PHT): 3.21 cm    SHUNTS MV Decel Time: 236 msec    Systemic VTI:  0.25 m MV E velocity: 75.10 cm/s  Systemic Diam: 2.30 cm MV A velocity: 95.00 cm/s MV E/A ratio:  0.79  Laurance Flatten MD Electronically signed by Laurance Flatten MD Signature Date/Time: 01/13/2021/12:25:53 PM    Final    MONITORS  LONG TERM MONITOR (3-14 DAYS) 02/03/2021  Narrative 3 Day Zio Monitor  Quality: Fair.  Baseline artifact. Predominant rhythm: sinus rhythm Average heart rate: 67 bpm Max heart rate: 145 bpm Min heart rate: 42 bpm Pauses >2.5 seconds: none 5 beat episode of SVT.  Heart rate 106 bpm. Occasional PVCs and ventricular bigeminy/trigeminy (<1%)  Tiffany C. Duke Salvia, MD, [facility] 02/03/2021 6:11 PM   CT SCANS  CT CORONARY MORPH W/CTA COR W/SCORE 04/28/2022  Addendum 04/28/2022 10:22 AM ADDENDUM  REPORT: 04/28/2022 10:20  EXAM: OVER-READ INTERPRETATION  CT CHEST  The following report is an over-read performed by radiologist Dr. Karle Barr [facility] on 04/28/2022. This over-read does not include interpretation of cardiac or coronary anatomy or pathology. The coronary CTA interpretation by the cardiologist is attached.  COMPARISON:  12/03/2021  FINDINGS: Aorta normal caliber. No pericardial effusion. Pulmonary arteries patent. Visualized upper abdomen unremarkable. Esophagus normal appearance. No adenopathy. Small focus of probable fat necrosis RIGHT breast unchanged. Lungs clear. No infiltrate, pleural effusion, or pneumothorax.  IMPRESSION: No significant extracardiac abnormalities.   Electronically Signed By: Ulyses Southward M.D. On: 04/28/2022 10:20  Narrative CLINICAL DATA:  67 yo female with chest pain  EXAM: Cardiac/Coronary CTA  TECHNIQUE: A non-contrast, gated CT scan was obtained with axial slices of 3 mm through the heart for calcium scoring. Calcium scoring was performed using the Agatston method. A 120 kV prospective, gated, contrast cardiac scan was obtained. Gantry rotation speed was 250 msecs and collimation was 0.6 mm. Two sublingual nitroglycerin tablets (0.8 mg) were given. The 3D data set was reconstructed in 5% intervals of the 35-75% of the R-R cycle. Diastolic phases were analyzed on a dedicated workstation using MPR, MIP, and VRT modes. The patient received 95 cc of contrast.  FINDINGS: Image quality: Average.  Noise artifact is: Limited.  Coronary Arteries:  Normal coronary origin.  Right dominance.  Left main: The left main is a large caliber vessel with a normal take off from the left coronary cusp that trifurcates into a LAD, LCX, and ramus intermedius. There is no plaque or stenosis.  Left anterior descending artery: The LAD has minimal (0-24) calcified plaque in the proximal and mid vessel. The LAD gives off  2 diagonal branches; there is minimal (0-24) calcified plaque in the ostium of D1 and distal vessel.  Ramus intermedius: Patent with no evidence of  plaque or stenosis.  Left circumflex artery: The LCX is non-dominant and patent with no evidence of plaque or stenosis. The LCX gives off 1 patent obtuse marginal branch.  Right coronary artery: The RCA is dominant with normal take off from the right coronary cusp. There is minimal (0-24) calcified plaque in the proximal, mid and distal vessel. The RCA terminates as a PDA and right posterolateral branch without evidence of plaque or stenosis.  Right Atrium: Right atrial size is within normal limits.  Right Ventricle: The right ventricular cavity is within normal limits.  Left Atrium: Left atrial size is normal in size with no left atrial appendage filling defect.  Left Ventricle: The ventricular cavity size is within normal limits. There are no stigmata of prior infarction. There is no abnormal filling defect.  Pulmonary arteries: Normal in size without proximal filling defect.  Pulmonary veins: Normal pulmonary venous drainage.  Pericardium: Normal thickness with no significant effusion or calcium present.  Cardiac valves: The aortic valve is trileaflet without significant calcification. The mitral valve is normal structure without significant calcification.  Aorta: Normal caliber with aortic atherosclerosis.  Extra-cardiac findings: See attached radiology report for non-cardiac structures.  IMPRESSION: 1. Coronary calcium score of 157. This was 55 percentile for age-, sex, and race-matched controls.  2. Normal coronary origin with right dominance.  3. Minimal (0-24) plaque in the LAD and RCA.  4. Aortic atherosclerosis.  RECOMMENDATIONS: CAD-RADS 1: Minimal non-obstructive CAD (0-24%). Consider non-atherosclerotic causes of chest pain. Consider preventive therapy and risk factor modification.  Olga Millers,  MD  Electronically Signed: By: Olga Millers M.D. On: 04/28/2022 09:30          Risk Assessment/Calculations:     No BP recorded.  {Refresh Note OR Click here to enter BP  :1}***   STOP-Bang Score:     { Consider Dx Sleep Disordered Breathing or Sleep Apnea  ICD G47.33          :1}    Physical Exam:   VS:  There were no vitals taken for this visit.   Wt Readings from Last 3 Encounters:  12/15/22 223 lb 12.8 oz (101.5 kg)  12/12/22 221 lb 9.6 oz (100.5 kg)  11/15/22 216 lb (98 kg)    GEN: Well nourished, well developed in no acute distress NECK: No JVD; No carotid bruits CARDIAC: ***RRR, no murmurs, rubs, gallops RESPIRATORY:  Clear to auscultation without rales, wheezing or rhonchi  ABDOMEN: Soft, non-tender, non-distended EXTREMITIES:  No edema; No deformity   ASSESSMENT AND PLAN: .    Bradycardia / SVT / Palpitations -3-day monitor 01/08/2021 with predominantly normal sinus rhythm with average heart rate of 67 bpm.  Minimum heart rate 42 bpm.  No pauses greater than 2.5 seconds.  One 5 beat episode of SVT with heart rate 106 bpm.  Given baseline low heart rate recommend preventative measures such as avoiding caffeine, deep breathing and will defer addition of AV nodal blocking agent-she is agreeable with this plan.  Minimum sinus rate of 42 bpm was 8:20 AM, anticipate she was asleep.  Sleep study has been arranged, as below as findings are suggestive of sleep apnea.    Hyperlipidemia -continue atorvastatin and 20 mg daily per primary care.   Hypertension -***        {Are you ordering a CV Procedure (e.g. stress test, cath, DCCV, TEE, etc)?   Press F2        :[id number]}  Dispo: ***  Signed, Alver Sorrow, NP

## 2022-12-21 ENCOUNTER — Ambulatory Visit: Payer: 59

## 2023-01-05 ENCOUNTER — Other Ambulatory Visit: Payer: Self-pay | Admitting: Student

## 2023-01-05 ENCOUNTER — Other Ambulatory Visit: Payer: Self-pay

## 2023-01-05 ENCOUNTER — Other Ambulatory Visit (HOSPITAL_BASED_OUTPATIENT_CLINIC_OR_DEPARTMENT_OTHER): Payer: Self-pay | Admitting: Cardiovascular Disease

## 2023-01-05 DIAGNOSIS — F320 Major depressive disorder, single episode, mild: Secondary | ICD-10-CM

## 2023-01-06 ENCOUNTER — Other Ambulatory Visit: Payer: Self-pay

## 2023-01-06 MED ORDER — ESCITALOPRAM OXALATE 5 MG PO TABS
5.0000 mg | ORAL_TABLET | Freq: Every day | ORAL | 0 refills | Status: DC
  Filled 2023-01-06: qty 15, 15d supply, fill #0

## 2023-01-12 ENCOUNTER — Other Ambulatory Visit: Payer: Self-pay | Admitting: Student

## 2023-01-12 ENCOUNTER — Other Ambulatory Visit (HOSPITAL_BASED_OUTPATIENT_CLINIC_OR_DEPARTMENT_OTHER): Payer: Self-pay | Admitting: Cardiovascular Disease

## 2023-01-12 ENCOUNTER — Other Ambulatory Visit: Payer: Self-pay | Admitting: Physical Medicine & Rehabilitation

## 2023-01-13 ENCOUNTER — Other Ambulatory Visit: Payer: Self-pay

## 2023-01-13 ENCOUNTER — Other Ambulatory Visit: Payer: Self-pay | Admitting: Student

## 2023-01-13 MED ORDER — NALOXONE HCL 4 MG/0.1ML NA LIQD
NASAL | 0 refills | Status: AC
  Filled 2023-01-13: qty 2, 1d supply, fill #0

## 2023-01-13 MED FILL — Lidocaine Patch 5%: CUTANEOUS | 30 days supply | Qty: 30 | Fill #0 | Status: AC

## 2023-01-14 ENCOUNTER — Other Ambulatory Visit: Payer: Self-pay

## 2023-01-15 ENCOUNTER — Other Ambulatory Visit: Payer: Self-pay

## 2023-01-16 ENCOUNTER — Encounter: Payer: 59 | Attending: Physical Medicine & Rehabilitation | Admitting: Registered Nurse

## 2023-01-16 ENCOUNTER — Other Ambulatory Visit: Payer: Self-pay

## 2023-01-16 ENCOUNTER — Encounter: Payer: Self-pay | Admitting: Registered Nurse

## 2023-01-16 VITALS — BP 148/85 | HR 66 | Ht 64.0 in | Wt 221.0 lb

## 2023-01-16 DIAGNOSIS — G894 Chronic pain syndrome: Secondary | ICD-10-CM | POA: Diagnosis not present

## 2023-01-16 DIAGNOSIS — R001 Bradycardia, unspecified: Secondary | ICD-10-CM

## 2023-01-16 DIAGNOSIS — Z5181 Encounter for therapeutic drug level monitoring: Secondary | ICD-10-CM | POA: Diagnosis not present

## 2023-01-16 DIAGNOSIS — M961 Postlaminectomy syndrome, not elsewhere classified: Secondary | ICD-10-CM | POA: Diagnosis not present

## 2023-01-16 DIAGNOSIS — M4807 Spinal stenosis, lumbosacral region: Secondary | ICD-10-CM | POA: Diagnosis not present

## 2023-01-16 DIAGNOSIS — Z79891 Long term (current) use of opiate analgesic: Secondary | ICD-10-CM

## 2023-01-16 MED ORDER — MORPHINE SULFATE 15 MG PO TABS
15.0000 mg | ORAL_TABLET | Freq: Two times a day (BID) | ORAL | 0 refills | Status: DC | PRN
  Filled 2023-01-16: qty 60, 30d supply, fill #0

## 2023-01-16 MED ORDER — MORPHINE SULFATE ER 15 MG PO TBCR
15.0000 mg | EXTENDED_RELEASE_TABLET | Freq: Three times a day (TID) | ORAL | 0 refills | Status: DC
  Filled 2023-01-16: qty 90, 30d supply, fill #0

## 2023-01-16 NOTE — Progress Notes (Signed)
Subjective:    Patient ID: [name] [name], female    DOB: September 14, 1955, 67 y.o.   MRN: [id number]  HPI: [name] [name] is a 67 y.o. female who returns for follow up appointment for chronic pain and medication refill. She states her pain is located in her lower back. She rate sher  pain 8. Her current exercise regime is walking and performing stretching exercises.  [name] [name] Morphine equivalent is 67.50 MME.   Last UDS was Performed on 08/19/2022, it was consistent.    Pain Inventory Average Pain 8 Pain Right Now 8 My pain is constant and aching  In the last 24 hours, has pain interfered with the following? General activity 8 Relation with others 8 Enjoyment of life 8 What TIME of day is your pain at its worst? morning , daytime, and night Sleep (in general) Fair  Pain is worse with: walking, inactivity, and standing Pain improves with: rest, heat/ice, and medication Relief from Meds: 1  Family History  Problem Relation Age of Onset   Hyperlipidemia Mother    Heart attack Mother    Heart disease Mother 69       CHF; CAD   Hypertension Mother    Heart failure Mother    Diabetes Sister    Lupus Sister    Kidney failure Sister    Hyperlipidemia Sister    Hypertension Sister    Stroke Maternal Aunt    Hypertension Brother    Colon cancer Neg Hx    Rectal cancer Neg Hx    Breast cancer Neg Hx    Social History   Socioeconomic History   Marital status: Widowed    Spouse name: Not on file   Number of children: 3   Years of education: Not on file   Highest education level: Associate degree: occupational, Scientist, product/process development, or vocational program  Occupational History   Occupation: disability    Comment: 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  Tobacco Use   Smoking status: Former    Current packs/day: 0.00    Average packs/day: 0.5 packs/day for 20.0 years (10.0 ttl pk-yrs)    Types: Cigarettes    Start date: 07/21/1988    Quit date: 07/21/2008    Years since quitting: 14.4   Smokeless  tobacco: Never  Vaping Use   Vaping status: Never Used  Substance and Sexual Activity   Alcohol use: No   Drug use: No   Sexual activity: Not on file  Other Topics Concern   Not on file  Social History Narrative   Marital status: widowed since 2002; not dating which is bad in 2018      Children: 3 children (45, 36, 53); 13 grandchildren; 1 gg      Employment: disability since 2009; retail      Lives: alone in [place] in apartment      Tobacco: quit in 2011      Alcohol: none      Drugs: none      Exercise: minimal in 2018      ADLs: independent with ADLs; drives      Seatbelt: 100%; no texting.   Social Determinants of Health   Financial Resource Strain: Low Risk  (10/24/2022)   Overall Financial Resource Strain (CARDIA)    Difficulty of Paying Living Expenses: Not very hard  Food Insecurity: No Food Insecurity (10/24/2022)   Hunger Vital Sign    Worried About Running Out of Food in the Last Year: Never true    Ran  Out of Food in the Last Year: Never true  Transportation Needs: No Transportation Needs (10/24/2022)   PRAPARE - Administrator, Civil Service (Medical): No    Lack of Transportation (Non-Medical): No  Physical Activity: Inactive (10/24/2022)   Exercise Vital Sign    Days of Exercise per Week: 0 days    Minutes of Exercise per Session: 0 min  Stress: Stress Concern Present (10/24/2022)   [employer] of Occupational Health - Occupational Stress Questionnaire    Feeling of Stress : Very much  Social Connections: Moderately Integrated (10/24/2022)   Social Connection and Isolation Panel [NHANES]    Frequency of Communication with Friends and Family: Three times a week    Frequency of Social Gatherings with Friends and Family: Three times a week    Attends Religious Services: More than 4 times per year    Active Member of Clubs or Organizations: Yes    Attends Banker Meetings: More than 4 times per year    Marital Status: Widowed   Past  Surgical History:  Procedure Laterality Date   ABDOMINAL HYSTERECTOMY  1982   DUB; ovaries intact   bladder surg  1992   bladder retraction   BREAST BIOPSY     BREAST REDUCTION SURGERY  1984   CARDIAC CATHETERIZATION  1990's   [name]   COLON SURGERY  05/11/2011   [facility] of adhesions Crohn's   MASS EXCISION Left 01/03/2018   Procedure: EXCISION LEFT UPPER BACK LIPOMA ERAS PATH;  Surgeon: Harriette Bouillon, MD;  Location: [facility];  Service: General;  Laterality: Left;   REDUCTION MAMMAPLASTY     SPINE SURGERY     5 total (1 upper, 4 lumbar)   SPINE SURGERY     nerve stimulator on right side   wrist and hand Right 07/2021   Dr. Annell Greening   Past Surgical History:  Procedure Laterality Date   ABDOMINAL HYSTERECTOMY  1982   DUB; ovaries intact   bladder surg  1992   bladder retraction   BREAST BIOPSY     BREAST REDUCTION SURGERY  1984   CARDIAC CATHETERIZATION  1990's   [name]   COLON SURGERY  05/11/2011   [facility] of adhesions Crohn's   MASS EXCISION Left 01/03/2018   Procedure: EXCISION LEFT UPPER BACK LIPOMA ERAS PATH;  Surgeon: Harriette Bouillon, MD;  Location: [facility];  Service: General;  Laterality: Left;   REDUCTION MAMMAPLASTY     SPINE SURGERY     5 total (1 upper, 4 lumbar)   SPINE SURGERY     nerve stimulator on right side   wrist and hand Right 07/2021   Dr. Annell Greening   Past Medical History:  Diagnosis Date   Anemia    Blood transfusion without reported diagnosis    Crohn's disease (HCC)    Disorder of sacroiliac joint    Exertional dyspnea 05/28/2021   Hyperlipidemia    Hypertension    Ischemic colitis (HCC)    Lumbago    Lumbar post-laminectomy syndrome    Lumbosacral neuritis    Lumbosacral radiculitis    OSA (obstructive sleep apnea) 05/28/2021   Sciatica    Ht 5\' 4"  (1.626 m)   Wt 221 lb (100.2 kg)   BMI 37.93 kg/m   Opioid Risk Score:   Fall Risk Score:  `1  Depression screen [facility] 2/9      12/15/2022   10:21 AM 12/12/2022  2:44 PM 11/15/2022    9:58 AM 11/10/2022    2:38 PM 10/27/2022    1:29 PM 10/24/2022    2:04 PM 10/19/2022    8:18 AM  Depression screen PHQ 2/9  Decreased Interest 0 3 0 2 1 3  0  Down, Depressed, Hopeless 0 1 0 0 0 1 0  PHQ - 2 Score 0 4 0 2 1 4  0  Altered sleeping  0  1 0    Tired, decreased energy  1  1 1     Change in appetite  3  2 2     Feeling bad or failure about yourself   0  0 0    Trouble concentrating  0  0 0    Moving slowly or fidgety/restless  0  0 0    Suicidal thoughts  0  0 0    PHQ-9 Score  8  6 4     Difficult doing work/chores  Extremely dIfficult  Somewhat difficult Not difficult at all      Review of Systems  Musculoskeletal:  Positive for back pain.       Right shoulder  All other systems reviewed and are negative.     Objective:   Physical Exam Vitals and nursing note reviewed.  Constitutional:      Appearance: Normal appearance.  Cardiovascular:     Rate and Rhythm: Normal rate and regular rhythm.     Pulses: Normal pulses.     Heart sounds: Normal heart sounds.  Pulmonary:     Effort: Pulmonary effort is normal.     Breath sounds: Normal breath sounds.  Musculoskeletal:     Cervical back: Normal range of motion and neck supple.     Comments: Normal Muscle Bulk and Muscle Testing Reveals:  Upper Extremities: Full ROM and Muscle Strength 5/5  Lumbar Paraspinal Tenderness: L-4-L-5 Lower Extremities: Full ROM and Muscle Strength 5/5 Arises from Table with ease Narrow Based  Gait     Skin:    General: Skin is warm and dry.  Neurological:     Mental Status: She is alert and oriented to person, place, and time.  Psychiatric:        Mood and Affect: Mood normal.        Behavior: Behavior normal.         Assessment & Plan:  1.Lumbar Postlaminectomy/ Spinal Stenosis Lumbar Region/ Post-Op Pain/Low back pain with radiating symptoms in L3-4 distribution:  continues to be limited by pain. Refilled:  MSIR 15 mg one  tablet twice a day as needed for moderate to sever pain #60 and  Morphine Sulfate ER 15 mg Q 8 hours. 01/16/2023. We will continue the opioid monitoring program, this consists of regular clinic visits, examinations, urine drug screen, pill counts as well as use of [place] Controlled Substance Reporting system. A 12 month History has been reviewed on the [place] Controlled Substance Reporting System on 11/15/2022. Encouraged to Continue exercise regime.   2. Lumbar Radiculopathy: No complaints today. Continue Topamax:.Continue to Monitor 01/16/2023 3.Chronic  Right Knee Pain: No complaints today.  Continue with Ice Therapy and  Voltaren Gel. 01/16/2023 4. Muscle Spasm: Continue current treatment: Tizanidine. 01/16/2023  5. Lipoma: S/P Left  Lipoma Excision of upper back  By Dr. Luisa Hart on 01/03/2018. Surgery Following. Continue to Monitor. 01/16/2023.   6. Paresthesia of Skin: Continue Topamax. Continue to monitor. 01/16/2023 7. Adhesive Capsulitis of Right Shoulder:Right Shoulder Tendonitis Pain:  No complaints today. S/P Right Shoulder Cortisone  Injection with Dr Wynn Banker, with good relief noted. Continue to monitor. Continue HEP as Tolerated. 01/16/2023  8. Polyarthralgia: Continue HEP as Tolerated. Continue to Monitor.01/16/2023  9. Sacral Pain: No complaints today. Continue to Monitor. 01/16/2023 10. Right Hand Pain: S/P :Post right middle finger trigger finger release carpal tunnel release. Dr Ophelia Charter Following.  Continue to monitor. 01/16/2023  11. Left hand Pain Left Index Trigger Finger: Dr Ophelia Charter following. 01/16/2023   F/U in 1 month

## 2023-01-17 ENCOUNTER — Other Ambulatory Visit: Payer: Self-pay

## 2023-01-17 ENCOUNTER — Ambulatory Visit: Payer: 59 | Admitting: Orthopaedic Surgery

## 2023-01-18 ENCOUNTER — Other Ambulatory Visit: Payer: Self-pay

## 2023-01-18 MED FILL — Chlorthalidone Tab 25 MG: ORAL | 90 days supply | Qty: 90 | Fill #0 | Status: AC

## 2023-01-21 ENCOUNTER — Other Ambulatory Visit (HOSPITAL_BASED_OUTPATIENT_CLINIC_OR_DEPARTMENT_OTHER): Payer: Self-pay | Admitting: Cardiovascular Disease

## 2023-01-21 ENCOUNTER — Other Ambulatory Visit: Payer: Self-pay | Admitting: Student

## 2023-01-21 DIAGNOSIS — E119 Type 2 diabetes mellitus without complications: Secondary | ICD-10-CM

## 2023-01-22 ENCOUNTER — Other Ambulatory Visit: Payer: Self-pay

## 2023-01-22 ENCOUNTER — Other Ambulatory Visit: Payer: Self-pay | Admitting: Student

## 2023-01-22 ENCOUNTER — Other Ambulatory Visit (HOSPITAL_BASED_OUTPATIENT_CLINIC_OR_DEPARTMENT_OTHER): Payer: Self-pay | Admitting: Cardiovascular Disease

## 2023-01-22 DIAGNOSIS — E119 Type 2 diabetes mellitus without complications: Secondary | ICD-10-CM

## 2023-01-23 ENCOUNTER — Other Ambulatory Visit: Payer: Self-pay

## 2023-01-23 MED FILL — Metoprolol Succinate Tab ER 24HR 25 MG (Tartrate Equiv): ORAL | 90 days supply | Qty: 45 | Fill #0 | Status: AC

## 2023-01-23 MED FILL — Losartan Potassium Tab 25 MG: ORAL | 60 days supply | Qty: 30 | Fill #0 | Status: AC

## 2023-01-23 NOTE — Progress Notes (Deleted)
    SUBJECTIVE:   CHIEF COMPLAINT / HPI: Depression follow-up  Depression At last visit 6/24 was started on Lexapro 5 mg daily.  Here for follow-up of mood today.  States that***.    01/16/2023    8:30 AM 12/15/2022   10:21 AM 12/12/2022    2:44 PM 11/15/2022    9:58 AM 11/10/2022    2:38 PM  Depression screen PHQ 2/9  Decreased Interest 0 0 3 0 2  Down, Depressed, Hopeless 0 0 1 0 0  PHQ - 2 Score 0 0 4 0 2  Altered sleeping   0  1  Tired, decreased energy   1  1  Change in appetite   3  2  Feeling bad or failure about yourself    0  0  Trouble concentrating   0  0  Moving slowly or fidgety/restless   0  0  Suicidal thoughts   0  0  PHQ-9 Score   8  6  Difficult doing work/chores   Extremely dIfficult  Somewhat difficult       PERTINENT  PMH / PSH: HTN, OSA, chronic pain on morphine  OBJECTIVE:   There were no vitals taken for this visit.  ***  ASSESSMENT/PLAN:   No problem-specific Assessment & Plan notes found for this encounter.     Levin Erp, MD [facility]

## 2023-01-26 ENCOUNTER — Ambulatory Visit: Payer: 59 | Admitting: Student

## 2023-02-07 ENCOUNTER — Encounter: Payer: 59 | Attending: Physical Medicine & Rehabilitation | Admitting: Registered Nurse

## 2023-02-07 ENCOUNTER — Encounter: Payer: Self-pay | Admitting: Registered Nurse

## 2023-02-07 ENCOUNTER — Other Ambulatory Visit: Payer: Self-pay

## 2023-02-07 VITALS — BP 132/85 | HR 58 | Ht 64.0 in | Wt 223.0 lb

## 2023-02-07 DIAGNOSIS — Z5181 Encounter for therapeutic drug level monitoring: Secondary | ICD-10-CM | POA: Insufficient documentation

## 2023-02-07 DIAGNOSIS — Z79891 Long term (current) use of opiate analgesic: Secondary | ICD-10-CM | POA: Insufficient documentation

## 2023-02-07 DIAGNOSIS — R001 Bradycardia, unspecified: Secondary | ICD-10-CM | POA: Diagnosis not present

## 2023-02-07 DIAGNOSIS — G894 Chronic pain syndrome: Secondary | ICD-10-CM | POA: Insufficient documentation

## 2023-02-07 DIAGNOSIS — M4807 Spinal stenosis, lumbosacral region: Secondary | ICD-10-CM | POA: Diagnosis not present

## 2023-02-07 MED ORDER — MORPHINE SULFATE 15 MG PO TABS
15.0000 mg | ORAL_TABLET | Freq: Two times a day (BID) | ORAL | 0 refills | Status: DC | PRN
  Filled 2023-02-07 – 2023-02-14 (×2): qty 60, 30d supply, fill #0

## 2023-02-07 MED ORDER — MORPHINE SULFATE ER 15 MG PO TBCR
15.0000 mg | EXTENDED_RELEASE_TABLET | Freq: Three times a day (TID) | ORAL | 0 refills | Status: DC
  Filled 2023-02-07 – 2023-02-14 (×2): qty 90, 30d supply, fill #0

## 2023-02-07 NOTE — Progress Notes (Addendum)
Subjective:    Patient ID: [name] [name], female    DOB: 1955/07/13, 67 y.o.   MRN: [id number]  HPI: [name] [name] is a 67 y.o. female who returns for follow up appointment for chronic pain and medication refill. She states her pain is located in her lower back. She rates her pain 8. Her current exercise regime is walking and performing stretching exercises.  [name] [name] Morphine equivalent is 75.00 MME.   Last UDS was Performed on 08/19/2022, it was consistent.      Pain Inventory Average Pain 8 Pain Right Now 8 My pain is constant and aching  In the last 24 hours, has pain interfered with the following? General activity 9 Relation with others 9 Enjoyment of life 9 What TIME of day is your pain at its worst? daytime and night Sleep (in general) Fair  Pain is worse with: walking, inactivity, and standing Pain improves with: rest, heat/ice, medication, and TENS Relief from Meds: 1  Family History  Problem Relation Age of Onset   Hyperlipidemia Mother    Heart attack Mother    Heart disease Mother 74       CHF; CAD   Hypertension Mother    Heart failure Mother    Diabetes Sister    Lupus Sister    Kidney failure Sister    Hyperlipidemia Sister    Hypertension Sister    Stroke Maternal Aunt    Hypertension Brother    Colon cancer Neg Hx    Rectal cancer Neg Hx    Breast cancer Neg Hx    Social History   Socioeconomic History   Marital status: Widowed    Spouse name: Not on file   Number of children: 3   Years of education: Not on file   Highest education level: Associate degree: occupational, Scientist, product/process development, or vocational program  Occupational History   Occupation: disability    Comment: 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  Tobacco Use   Smoking status: Former    Current packs/day: 0.00    Average packs/day: 0.5 packs/day for 20.0 years (10.0 ttl pk-yrs)    Types: Cigarettes    Start date: 07/21/1988    Quit date: 07/21/2008    Years since quitting: 14.5   Smokeless  tobacco: Never  Vaping Use   Vaping status: Never Used  Substance and Sexual Activity   Alcohol use: No   Drug use: No   Sexual activity: Not on file  Other Topics Concern   Not on file  Social History Narrative   Marital status: widowed since 2002; not dating which is bad in 2018      Children: 3 children (45, 64, 40); 13 grandchildren; 1 gg      Employment: disability since 2009; retail      Lives: alone in [place] in apartment      Tobacco: quit in 2011      Alcohol: none      Drugs: none      Exercise: minimal in 2018      ADLs: independent with ADLs; drives      Seatbelt: 100%; no texting.   Social Determinants of Health   Financial Resource Strain: Low Risk  (10/24/2022)   Overall Financial Resource Strain (CARDIA)    Difficulty of Paying Living Expenses: Not very hard  Food Insecurity: No Food Insecurity (10/24/2022)   Hunger Vital Sign    Worried About Running Out of Food in the Last Year: Never true    Ran  Out of Food in the Last Year: Never true  Transportation Needs: No Transportation Needs (10/24/2022)   PRAPARE - Administrator, Civil Service (Medical): No    Lack of Transportation (Non-Medical): No  Physical Activity: Inactive (10/24/2022)   Exercise Vital Sign    Days of Exercise per Week: 0 days    Minutes of Exercise per Session: 0 min  Stress: Stress Concern Present (10/24/2022)   [employer] of Occupational Health - Occupational Stress Questionnaire    Feeling of Stress : Very much  Social Connections: Moderately Integrated (10/24/2022)   Social Connection and Isolation Panel [NHANES]    Frequency of Communication with Friends and Family: Three times a week    Frequency of Social Gatherings with Friends and Family: Three times a week    Attends Religious Services: More than 4 times per year    Active Member of Clubs or Organizations: Yes    Attends Banker Meetings: More than 4 times per year    Marital Status: Widowed   Past  Surgical History:  Procedure Laterality Date   ABDOMINAL HYSTERECTOMY  1982   DUB; ovaries intact   bladder surg  1992   bladder retraction   BREAST BIOPSY     BREAST REDUCTION SURGERY  1984   CARDIAC CATHETERIZATION  1990's   [name]   COLON SURGERY  05/11/2011   [facility] of adhesions Crohn's   MASS EXCISION Left 01/03/2018   Procedure: EXCISION LEFT UPPER BACK LIPOMA ERAS PATH;  Surgeon: Harriette Bouillon, MD;  Location: [facility];  Service: General;  Laterality: Left;   REDUCTION MAMMAPLASTY     SPINE SURGERY     5 total (1 upper, 4 lumbar)   SPINE SURGERY     nerve stimulator on right side   wrist and hand Right 07/2021   Dr. Annell Greening   Past Surgical History:  Procedure Laterality Date   ABDOMINAL HYSTERECTOMY  1982   DUB; ovaries intact   bladder surg  1992   bladder retraction   BREAST BIOPSY     BREAST REDUCTION SURGERY  1984   CARDIAC CATHETERIZATION  1990's   [name]   COLON SURGERY  05/11/2011   [facility] of adhesions Crohn's   MASS EXCISION Left 01/03/2018   Procedure: EXCISION LEFT UPPER BACK LIPOMA ERAS PATH;  Surgeon: Harriette Bouillon, MD;  Location: [facility];  Service: General;  Laterality: Left;   REDUCTION MAMMAPLASTY     SPINE SURGERY     5 total (1 upper, 4 lumbar)   SPINE SURGERY     nerve stimulator on right side   wrist and hand Right 07/2021   Dr. Annell Greening   Past Medical History:  Diagnosis Date   Anemia    Blood transfusion without reported diagnosis    Crohn's disease (HCC)    Disorder of sacroiliac joint    Exertional dyspnea 05/28/2021   Hyperlipidemia    Hypertension    Ischemic colitis (HCC)    Lumbago    Lumbar post-laminectomy syndrome    Lumbosacral neuritis    Lumbosacral radiculitis    OSA (obstructive sleep apnea) 05/28/2021   Sciatica    BP 132/85   Pulse (!) 57   Ht 5\' 4"  (1.626 m)   Wt 223 lb (101.2 kg)   SpO2 98%   BMI 38.28 kg/m   Opioid Risk Score:   Fall Risk  Score:  `1  Depression screen [facility] 2/9  01/16/2023    8:30 AM 12/15/2022   10:21 AM 12/12/2022    2:44 PM 11/15/2022    9:58 AM 11/10/2022    2:38 PM 10/27/2022    1:29 PM 10/24/2022    2:04 PM  Depression screen PHQ 2/9  Decreased Interest 0 0 3 0 2 1 3   Down, Depressed, Hopeless 0 0 1 0 0 0 1  PHQ - 2 Score 0 0 4 0 2 1 4   Altered sleeping   0  1 0   Tired, decreased energy   1  1 1    Change in appetite   3  2 2    Feeling bad or failure about yourself    0  0 0   Trouble concentrating   0  0 0   Moving slowly or fidgety/restless   0  0 0   Suicidal thoughts   0  0 0   PHQ-9 Score   8  6 4    Difficult doing work/chores   Extremely dIfficult  Somewhat difficult Not difficult at all     Review of Systems  Musculoskeletal:  Positive for back pain.       PAIN IN RIGHT BUTTOCK & RIGHT SHOULDER  All other systems reviewed and are negative.      Objective:   Physical Exam Vitals and nursing note reviewed.  Constitutional:      Appearance: Normal appearance.  Cardiovascular:     Rate and Rhythm: Regular rhythm. Bradycardia present.  Pulmonary:     Effort: Pulmonary effort is normal.     Breath sounds: Normal breath sounds.  Musculoskeletal:     Cervical back: Normal range of motion and neck supple.     Comments: Normal Muscle Bulk and Muscle Testing Reveals:  Upper Extremities: Full ROM and Muscle Strength 5/5 Lumbar Paraspinal Tenderness: L-4-L-5 Lower Extremities: Full ROM and Muscle Strength 5/5 Arises from Table slowly  Narrow Based  Gait     Skin:    General: Skin is warm and dry.  Neurological:     Mental Status: She is alert and oriented to person, place, and time.  Psychiatric:        Mood and Affect: Mood normal.        Behavior: Behavior normal.         Assessment & Plan:  1.Lumbar Postlaminectomy/ Spinal Stenosis Lumbar Region/ Post-Op Pain/Low back pain with radiating symptoms in L3-4 distribution:  continues to be limited by pain. Refilled:  MSIR 15 mg  one tablet twice a day as needed for moderate to sever pain #60 and  Morphine Sulfate ER 15 mg Q 8 hours. 02/07/2023. We will continue the opioid monitoring program, this consists of regular clinic visits, examinations, urine drug screen, pill counts as well as use of [place] Controlled Substance Reporting system. A 12 month History has been reviewed on the [place] Controlled Substance Reporting System on 11/15/2022. Encouraged to Continue exercise regime.   2. Lumbar Radiculopathy: No complaints today. Continue Topamax:.Continue to Monitor 02/07/2023 3.Chronic  Right Knee Pain: No complaints today.  Continue with Ice Therapy and  Voltaren Gel. 02/07/2023 4. Muscle Spasm: Continue current treatment: Tizanidine. 02/07/2023  5. Lipoma: S/P Left  Lipoma Excision of upper back  By Dr. Luisa Hart on 01/03/2018. Surgery Following. Continue to Monitor. 02/07/2023.   6. Paresthesia of Skin: Continue Topamax. Continue to monitor. 02/07/2023 7. Adhesive Capsulitis of Right Shoulder:Right Shoulder Tendonitis Pain:  No complaints today. S/P Right Shoulder Cortisone Injection with Dr  Kirsteins, with good relief noted. Continue to monitor. Continue HEP as Tolerated. 02/07/2023  8. Polyarthralgia: Continue HEP as Tolerated. Continue to Monitor.02/07/2023  9. Sacral Pain: No complaints today. Continue to Monitor. 02/07/2023 10. Right Hand Pain: S/P :Post right middle finger trigger finger release carpal tunnel release. Dr Ophelia Charter Following.  Continue to monitor. 02/07/2023  11. Left hand Pain Left Index Trigger Finger: Dr Ophelia Charter following. 02/07/2023 12. Bradycardia: Apical Pulse checked: She is currently on Losartan and Metoprolol, she states her Cardiologist and PCP following. Continue to monitor.  F/U in 1 month

## 2023-02-09 ENCOUNTER — Other Ambulatory Visit: Payer: Self-pay

## 2023-02-09 ENCOUNTER — Ambulatory Visit (INDEPENDENT_AMBULATORY_CARE_PROVIDER_SITE_OTHER): Payer: 59 | Admitting: Student

## 2023-02-09 VITALS — BP 158/91 | HR 63 | Wt 224.4 lb

## 2023-02-09 DIAGNOSIS — E119 Type 2 diabetes mellitus without complications: Secondary | ICD-10-CM

## 2023-02-09 DIAGNOSIS — E669 Obesity, unspecified: Secondary | ICD-10-CM

## 2023-02-09 DIAGNOSIS — F320 Major depressive disorder, single episode, mild: Secondary | ICD-10-CM

## 2023-02-09 DIAGNOSIS — I10 Essential (primary) hypertension: Secondary | ICD-10-CM

## 2023-02-09 DIAGNOSIS — E66812 Obesity, class 2: Secondary | ICD-10-CM

## 2023-02-09 MED ORDER — ESCITALOPRAM OXALATE 5 MG PO TABS
5.0000 mg | ORAL_TABLET | Freq: Every day | ORAL | 0 refills | Status: DC
Start: 2023-02-09 — End: 2023-02-09
  Filled 2023-02-09: qty 15, 15d supply, fill #0

## 2023-02-09 MED ORDER — SEMAGLUTIDE (1 MG/DOSE) 4 MG/3ML ~~LOC~~ SOPN
1.0000 mg | PEN_INJECTOR | SUBCUTANEOUS | 0 refills | Status: DC
Start: 2023-02-09 — End: 2023-03-10
  Filled 2023-02-09: qty 3, 28d supply, fill #0

## 2023-02-09 MED ORDER — ESCITALOPRAM OXALATE 5 MG PO TABS
5.0000 mg | ORAL_TABLET | Freq: Every day | ORAL | 0 refills | Status: DC
Start: 2023-02-09 — End: 2023-04-10
  Filled 2023-02-09: qty 60, 60d supply, fill #0

## 2023-02-09 NOTE — Progress Notes (Signed)
    SUBJECTIVE:   CHIEF COMPLAINT / HPI: Depression follow-up  Depression At last visit 6/24 was started on Lexapro 5 mg daily.  Here for follow-up of mood today.  States that she has been doing a lot better. States she was supposed to be seen earlier this month but due t weather couldn't come in and ran out of medication. Feels good at this dose. 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    02/07/2023    8:29 AM 01/16/2023    8:30 AM 12/15/2022   10:21 AM 12/12/2022    2:44 PM 11/15/2022    9:58 AM  Depression screen PHQ 2/9  Decreased Interest 0 0 0 3 0  Down, Depressed, Hopeless 0 0 0 1 0  PHQ - 2 Score 0 0 0 4 0  Altered sleeping    0   Tired, decreased energy    1   Change in appetite    3   Feeling bad or failure about yourself     0   Trouble concentrating    0   Moving slowly or fidgety/restless    0   Suicidal thoughts    0   PHQ-9 Score    8   Difficult doing work/chores    Extremely dIfficult     Diabetes  Obesity She states that her weight has been up. States she has been on 0.5 mg weekly of ozempic for 3 months. She denies any GI issues with current dose. Last A1c 6.9.  HTN Taking chlorthalidone 25 mg daily, losartan 12.5 mg daily, toprol 12.5 BID.    PERTINENT  PMH / PSH: HTN, OSA, chronic pain on morphine  OBJECTIVE:   BP (!) 158/91   Pulse 63   Wt 224 lb 6.4 oz (101.8 kg)   SpO2 100%   BMI 38.52 kg/m   General: Well appearing, NAD, awake, alert, responsive to questions Head: Normocephalic atraumatic Respiratory: Chest rises symmetrically,  no increased work of breathing  ASSESSMENT/PLAN:   Depression, major, single episode, mild (HCC) Seems to be doing much better on our visit today. Feels lexapro has helped a lot. She feels good at current dose. -Continue lexapro 5 mg daily, pt to let me know if wanting increase in medicine   Obesity, Class II, BMI 35-39.9 Up a couple pounds from last visit. She has been on 0.5 mg of ozempic weekly, she  feels ready to increase dose today. -Increase to 1 mg ozempic weekly -monitor   Essential hypertension Patient's blood pressure is not controlled today. BP: 140/55> (!) 158/91. Goal of 140/90. Patient's medication regimen includes chlorthalidone 25 mg daily, losartan 12.5 mg daily, toprol 12.5 BID. -Changes to current regimen include none -Follow up in 4 weeks for repeat   Levin Erp, MD [facility]

## 2023-02-09 NOTE — Assessment & Plan Note (Signed)
Seems to be doing much better on our visit today. Feels lexapro has helped a lot. She feels good at current dose. -Continue lexapro 5 mg daily, pt to let me know if wanting increase in medicine

## 2023-02-09 NOTE — Assessment & Plan Note (Signed)
Up a couple pounds from last visit. She has been on 0.5 mg of ozempic weekly, she feels ready to increase dose today. -Increase to 1 mg ozempic weekly -monitor

## 2023-02-09 NOTE — Assessment & Plan Note (Signed)
Patient's blood pressure is not controlled today. BP: 140/55> (!) 158/91. Goal of 140/90. Patient's medication regimen includes chlorthalidone 25 mg daily, losartan 12.5 mg daily, toprol 12.5 BID. -Changes to current regimen include none -Follow up in 4 weeks for repeat

## 2023-02-09 NOTE — Patient Instructions (Addendum)
It was great to see you! Thank you for allowing me to participate in your care!   Our plans for today:  - we will increase your ozempic to 1 mg weekly, let me know how you do on this - refill your lexapro 5 mg daily, if in 4 weeks you feel that you need more let us know and we can increase that dose  Take care and seek immediate care sooner if you develop any concerns.  Levin Erp, MD

## 2023-02-14 ENCOUNTER — Other Ambulatory Visit: Payer: Self-pay

## 2023-02-14 ENCOUNTER — Other Ambulatory Visit: Payer: Self-pay | Admitting: Student

## 2023-02-14 MED ORDER — LIDOCAINE 5 % EX PTCH
1.0000 | MEDICATED_PATCH | CUTANEOUS | 0 refills | Status: DC
  Filled 2023-02-14: qty 30, 30d supply, fill #0

## 2023-03-09 ENCOUNTER — Other Ambulatory Visit: Payer: Self-pay | Admitting: Student

## 2023-03-09 ENCOUNTER — Other Ambulatory Visit: Payer: Self-pay

## 2023-03-09 DIAGNOSIS — E119 Type 2 diabetes mellitus without complications: Secondary | ICD-10-CM

## 2023-03-10 ENCOUNTER — Other Ambulatory Visit: Payer: Self-pay

## 2023-03-10 MED FILL — Semaglutide Soln Pen-inj 1 MG/DOSE (4 MG/3ML): SUBCUTANEOUS | 28 days supply | Qty: 3 | Fill #0 | Status: AC

## 2023-03-10 MED FILL — Diclofenac Sodium Gel 1% (1.16% Diethylamine Equiv): CUTANEOUS | 10 days supply | Qty: 100 | Fill #0 | Status: CN

## 2023-03-10 MED FILL — Diclofenac Sodium Gel 1% (1.16% Diethylamine Equiv): CUTANEOUS | 20 days supply | Qty: 200 | Fill #0 | Status: CN

## 2023-03-14 ENCOUNTER — Encounter: Payer: 59 | Attending: Physical Medicine & Rehabilitation | Admitting: Registered Nurse

## 2023-03-14 ENCOUNTER — Encounter: Payer: Self-pay | Admitting: Registered Nurse

## 2023-03-14 ENCOUNTER — Other Ambulatory Visit: Payer: Self-pay

## 2023-03-14 VITALS — BP 125/67 | HR 62 | Ht 64.0 in | Wt 221.0 lb

## 2023-03-14 DIAGNOSIS — Z5181 Encounter for therapeutic drug level monitoring: Secondary | ICD-10-CM | POA: Diagnosis not present

## 2023-03-14 DIAGNOSIS — Z79891 Long term (current) use of opiate analgesic: Secondary | ICD-10-CM | POA: Diagnosis not present

## 2023-03-14 DIAGNOSIS — G894 Chronic pain syndrome: Secondary | ICD-10-CM | POA: Insufficient documentation

## 2023-03-14 MED ORDER — MORPHINE SULFATE ER 15 MG PO TBCR
15.0000 mg | EXTENDED_RELEASE_TABLET | Freq: Three times a day (TID) | ORAL | 0 refills | Status: DC
  Filled 2023-03-14 – 2023-03-17 (×2): qty 90, 30d supply, fill #0

## 2023-03-14 MED ORDER — MORPHINE SULFATE 15 MG PO TABS
15.0000 mg | ORAL_TABLET | Freq: Two times a day (BID) | ORAL | 0 refills | Status: DC | PRN
  Filled 2023-03-14 – 2023-03-17 (×2): qty 60, 30d supply, fill #0

## 2023-03-14 NOTE — Progress Notes (Signed)
Subjective:    Patient ID: [name] [name], female    DOB: 1956-06-03, 67 y.o.   MRN: [id number]  HPI: [name] [name] is a 67 y.o. female who returns for follow up appointment for chronic pain and medication refill. She states her pain is located in her lower back. She rates her pain 8. Her current exercise regime is walking and performing stretching exercises.  [name] [name]  Morphine equivalent is 75.00 MME.   UDS ordered today.      Pain Inventory Average Pain 8 Pain Right Now 8 My pain is constant, sharp, and aching  In the last 24 hours, has pain interfered with the following? General activity 8 Relation with others 8 Enjoyment of life 8 What TIME of day is your pain at its worst? morning , daytime, and night Sleep (in general) Poor  Pain is worse with: walking, inactivity, and standing Pain improves with: rest, heat/ice, and medication Relief from Meds: 2  Family History  Problem Relation Age of Onset   Hyperlipidemia Mother    Heart attack Mother    Heart disease Mother 1       CHF; CAD   Hypertension Mother    Heart failure Mother    Diabetes Sister    Lupus Sister    Kidney failure Sister    Hyperlipidemia Sister    Hypertension Sister    Stroke Maternal Aunt    Hypertension Brother    Colon cancer Neg Hx    Rectal cancer Neg Hx    Breast cancer Neg Hx    Social History   Socioeconomic History   Marital status: Widowed    Spouse name: Not on file   Number of children: 3   Years of education: Not on file   Highest education level: Associate degree: occupational, Scientist, product/process development, or vocational program  Occupational History   Occupation: disability    Comment: 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  Tobacco Use   Smoking status: Former    Current packs/day: 0.00    Average packs/day: 0.5 packs/day for 20.0 years (10.0 ttl pk-yrs)    Types: Cigarettes    Start date: 07/21/1988    Quit date: 07/21/2008    Years since quitting: 14.6   Smokeless tobacco: Never  Vaping Use    Vaping status: Never Used  Substance and Sexual Activity   Alcohol use: No   Drug use: No   Sexual activity: Not on file  Other Topics Concern   Not on file  Social History Narrative   Marital status: widowed since 2002; not dating which is bad in 2018      Children: 3 children (45, 32, 40); 13 grandchildren; 1 gg      Employment: disability since 2009; retail      Lives: alone in [place] in apartment      Tobacco: quit in 2011      Alcohol: none      Drugs: none      Exercise: minimal in 2018      ADLs: independent with ADLs; drives      Seatbelt: 100%; no texting.   Social Determinants of Health   Financial Resource Strain: Low Risk  (10/24/2022)   Overall Financial Resource Strain (CARDIA)    Difficulty of Paying Living Expenses: Not very hard  Food Insecurity: No Food Insecurity (10/24/2022)   Hunger Vital Sign    Worried About Running Out of Food in the Last Year: Never true    Ran Out of Food  in the Last Year: Never true  Transportation Needs: No Transportation Needs (10/24/2022)   PRAPARE - Administrator, Civil Service (Medical): No    Lack of Transportation (Non-Medical): No  Physical Activity: Inactive (10/24/2022)   Exercise Vital Sign    Days of Exercise per Week: 0 days    Minutes of Exercise per Session: 0 min  Stress: Stress Concern Present (10/24/2022)   [employer] of Occupational Health - Occupational Stress Questionnaire    Feeling of Stress : Very much  Social Connections: Moderately Integrated (10/24/2022)   Social Connection and Isolation Panel [NHANES]    Frequency of Communication with Friends and Family: Three times a week    Frequency of Social Gatherings with Friends and Family: Three times a week    Attends Religious Services: More than 4 times per year    Active Member of Clubs or Organizations: Yes    Attends Banker Meetings: More than 4 times per year    Marital Status: Widowed   Past Surgical History:  Procedure  Laterality Date   ABDOMINAL HYSTERECTOMY  1982   DUB; ovaries intact   bladder surg  1992   bladder retraction   BREAST BIOPSY     BREAST REDUCTION SURGERY  1984   CARDIAC CATHETERIZATION  1990's   [name]   COLON SURGERY  05/11/2011   [facility] of adhesions Crohn's   MASS EXCISION Left 01/03/2018   Procedure: EXCISION LEFT UPPER BACK LIPOMA ERAS PATH;  Surgeon: Harriette Bouillon, MD;  Location: [facility];  Service: General;  Laterality: Left;   REDUCTION MAMMAPLASTY     SPINE SURGERY     5 total (1 upper, 4 lumbar)   SPINE SURGERY     nerve stimulator on right side   wrist and hand Right 07/2021   Dr. Annell Greening   Past Surgical History:  Procedure Laterality Date   ABDOMINAL HYSTERECTOMY  1982   DUB; ovaries intact   bladder surg  1992   bladder retraction   BREAST BIOPSY     BREAST REDUCTION SURGERY  1984   CARDIAC CATHETERIZATION  1990's   [name]   COLON SURGERY  05/11/2011   [facility] of adhesions Crohn's   MASS EXCISION Left 01/03/2018   Procedure: EXCISION LEFT UPPER BACK LIPOMA ERAS PATH;  Surgeon: Harriette Bouillon, MD;  Location: [facility];  Service: General;  Laterality: Left;   REDUCTION MAMMAPLASTY     SPINE SURGERY     5 total (1 upper, 4 lumbar)   SPINE SURGERY     nerve stimulator on right side   wrist and hand Right 07/2021   Dr. Annell Greening   Past Medical History:  Diagnosis Date   Anemia    Blood transfusion without reported diagnosis    Crohn's disease (HCC)    Disorder of sacroiliac joint    Exertional dyspnea 05/28/2021   Hyperlipidemia    Hypertension    Ischemic colitis (HCC)    Lumbago    Lumbar post-laminectomy syndrome    Lumbosacral neuritis    Lumbosacral radiculitis    OSA (obstructive sleep apnea) 05/28/2021   Sciatica    There were no vitals taken for this visit.  Opioid Risk Score:   Fall Risk Score:  `1  Depression screen [facility] 2/9     02/07/2023    8:29 AM 01/16/2023    8:30 AM  12/15/2022   10:21 AM 12/12/2022    2:44  PM 11/15/2022    9:58 AM 11/10/2022    2:38 PM 10/27/2022    1:29 PM  Depression screen PHQ 2/9  Decreased Interest 0 0 0 3 0 2 1  Down, Depressed, Hopeless 0 0 0 1 0 0 0  PHQ - 2 Score 0 0 0 4 0 2 1  Altered sleeping    0  1 0  Tired, decreased energy    1  1 1   Change in appetite    3  2 2   Feeling bad or failure about yourself     0  0 0  Trouble concentrating    0  0 0  Moving slowly or fidgety/restless    0  0 0  Suicidal thoughts    0  0 0  PHQ-9 Score    8  6 4   Difficult doing work/chores    Extremely dIfficult  Somewhat difficult Not difficult at all    Review of Systems  Musculoskeletal:        Right shoulder pain  All other systems reviewed and are negative.      Objective:   Physical Exam Vitals and nursing note reviewed.  Constitutional:      Appearance: Normal appearance.  Cardiovascular:     Rate and Rhythm: Normal rate and regular rhythm.     Pulses: Normal pulses.     Heart sounds: Normal heart sounds.  Pulmonary:     Effort: Pulmonary effort is normal.     Breath sounds: Normal breath sounds.  Musculoskeletal:     Cervical back: Normal range of motion and neck supple.     Comments: Normal Muscle Bulk and Muscle Testing Reveals:  Upper Extremities: Full ROM and Muscle Strength 5/5  Lumbar Paraspinal Tenderness: L-4-L-5 Lower Extremities: Full ROM and Muscle Strength 5/5 Arises from Table slowly Narrow Based  Gait     Skin:    General: Skin is warm and dry.  Neurological:     Mental Status: She is alert and oriented to person, place, and time.  Psychiatric:        Mood and Affect: Mood normal.        Behavior: Behavior normal.         Assessment & Plan:  1.Lumbar Postlaminectomy/ Spinal Stenosis Lumbar Region/ Post-Op Pain/Low back pain with radiating symptoms in L3-4 distribution:  continues to be limited by pain. Refilled:  MSIR 15 mg one tablet twice a day as needed for moderate to sever pain #60 and   Morphine Sulfate ER 15 mg Q 8 hours. 03/14/2023. We will continue the opioid monitoring program, this consists of regular clinic visits, examinations, urine drug screen, pill counts as well as use of [place] Controlled Substance Reporting system. A 12 month History has been reviewed on the [place] Controlled Substance Reporting System on 11/15/2022. Encouraged to Continue exercise regime.   2. Lumbar Radiculopathy: No complaints today. Continue Topamax:.Continue to Monitor 03/14/2023 3.Chronic  Right Knee Pain: No complaints today.  Continue with Ice Therapy and  Voltaren Gel. 03/14/2023 4. Muscle Spasm: Continue current treatment: Tizanidine. 0924/2024  5. Lipoma: S/P Left  Lipoma Excision of upper back  By Dr. Luisa Hart on 01/03/2018. Surgery Following. Continue to Monitor. 03/14/2023.   6. Paresthesia of Skin: Continue Topamax. Continue to monitor. 03/14/2023 7. Adhesive Capsulitis of Right Shoulder:Right Shoulder Tendonitis Pain: . Scheduled for Right Shoulder Cortisone Injection with Dr Wynn Banker,  Continue to monitor. Continue HEP as Tolerated. 03/14/2023  8. Polyarthralgia: Continue HEP  as Tolerated. Continue to Monitor.03/14/2023  9. Sacral Pain: No complaints today. Continue to Monitor. 03/14/2023 10. Right Hand Pain: S/P :Post right middle finger trigger finger release carpal tunnel release. Dr Ophelia Charter Following.  Continue to monitor. 03/14/2023  11. Left hand Pain Left Index Trigger Finger: Dr Ophelia Charter following. 03/14/2023  F/U in 1 month

## 2023-03-16 ENCOUNTER — Encounter: Payer: Self-pay | Admitting: Student

## 2023-03-17 ENCOUNTER — Other Ambulatory Visit: Payer: Self-pay | Admitting: Student

## 2023-03-17 ENCOUNTER — Other Ambulatory Visit: Payer: Self-pay

## 2023-03-17 MED ORDER — LIDOCAINE 5 % EX PTCH
1.0000 | MEDICATED_PATCH | CUTANEOUS | 0 refills | Status: DC
  Filled 2023-03-17: qty 30, 30d supply, fill #0

## 2023-03-17 MED ORDER — DICLOFENAC SODIUM 1 % EX GEL: 1.0000 | Freq: Four times a day (QID) | CUTANEOUS | 2 refills | Status: DC | PRN

## 2023-03-20 ENCOUNTER — Other Ambulatory Visit: Payer: Self-pay

## 2023-03-21 ENCOUNTER — Encounter: Payer: Self-pay | Admitting: Student

## 2023-03-21 DIAGNOSIS — J301 Allergic rhinitis due to pollen: Secondary | ICD-10-CM

## 2023-03-21 LAB — TOXASSURE SELECT,+ANTIDEPR,UR

## 2023-03-22 ENCOUNTER — Other Ambulatory Visit: Payer: Self-pay

## 2023-03-22 MED ORDER — DICLOFENAC SODIUM 1 % EX GEL
1.0000 | Freq: Four times a day (QID) | CUTANEOUS | 2 refills | Status: AC | PRN
  Filled 2023-03-22 – 2023-04-12 (×4): qty 200, 20d supply, fill #0

## 2023-03-22 MED ORDER — FLUTICASONE PROPIONATE 50 MCG/ACT NA SUSP
2.0000 | Freq: Every day | NASAL | 1 refills | Status: DC
  Filled 2023-03-22 (×2): qty 48, 90d supply, fill #0
  Filled 2023-06-17: qty 48, 90d supply, fill #1

## 2023-03-23 ENCOUNTER — Other Ambulatory Visit: Payer: Self-pay

## 2023-03-23 ENCOUNTER — Other Ambulatory Visit: Payer: Self-pay | Admitting: Student

## 2023-03-23 DIAGNOSIS — E119 Type 2 diabetes mellitus without complications: Secondary | ICD-10-CM

## 2023-03-23 MED ORDER — LOSARTAN POTASSIUM 25 MG PO TABS
12.5000 mg | ORAL_TABLET | Freq: Every day | ORAL | 0 refills | Status: DC
Start: 2023-03-23 — End: 2023-05-22
  Filled 2023-03-23: qty 30, 60d supply, fill #0

## 2023-03-31 ENCOUNTER — Telehealth: Payer: Self-pay | Admitting: Registered Nurse

## 2023-03-31 NOTE — Telephone Encounter (Signed)
Letter written for [employer]

## 2023-04-03 ENCOUNTER — Telehealth: Payer: Self-pay | Admitting: Registered Nurse

## 2023-04-03 NOTE — Telephone Encounter (Signed)
Letter for [employer]

## 2023-04-04 ENCOUNTER — Encounter: Payer: Self-pay | Admitting: Physical Medicine & Rehabilitation

## 2023-04-04 ENCOUNTER — Encounter: Payer: 59 | Attending: Physical Medicine & Rehabilitation | Admitting: Physical Medicine & Rehabilitation

## 2023-04-04 ENCOUNTER — Other Ambulatory Visit: Payer: Self-pay

## 2023-04-04 VITALS — BP 152/83 | HR 68 | Ht 64.0 in | Wt 214.0 lb

## 2023-04-04 DIAGNOSIS — M778 Other enthesopathies, not elsewhere classified: Secondary | ICD-10-CM | POA: Insufficient documentation

## 2023-04-04 MED ORDER — MORPHINE SULFATE 15 MG PO TABS
15.0000 mg | ORAL_TABLET | Freq: Two times a day (BID) | ORAL | 0 refills | Status: DC | PRN
  Filled 2023-04-04 – 2023-04-12 (×2): qty 60, 30d supply, fill #0

## 2023-04-04 MED ORDER — BETAMETHASONE SOD PHOS & ACET 6 (3-3) MG/ML IJ SUSP
6.0000 mg | Freq: Once | INTRAMUSCULAR | Status: AC
Start: 2023-04-04 — End: 2023-04-04
  Administered 2023-04-04: 6 mg via INTRAMUSCULAR

## 2023-04-04 MED ORDER — MORPHINE SULFATE ER 15 MG PO TBCR
15.0000 mg | EXTENDED_RELEASE_TABLET | Freq: Three times a day (TID) | ORAL | 0 refills | Status: DC
  Filled 2023-04-04 – 2023-04-12 (×2): qty 90, 30d supply, fill #0

## 2023-04-04 MED ORDER — LIDOCAINE HCL 1 % IJ SOLN
5.0000 mL | Freq: Once | INTRAMUSCULAR | Status: AC
Start: 2023-04-04 — End: 2023-04-04
  Administered 2023-04-04: 5 mL

## 2023-04-04 NOTE — Progress Notes (Signed)
Shoulder injectionRight  Without ultrasound guidance)  Indication:Right Shoulder pain not relieved by medication management and other conservative care.  Informed consent was obtained after describing risks and benefits of the procedure with the patient, this includes bleeding, bruising, infection and medication side effects. The patient wishes to proceed and has given written consent. Patient was placed in a seated position. TheRight shoulder was marked and prepped with betadine in the subacromial area. A 25-gauge 1-1/2 inch needle was used to anesthetize needle track with 1% lidocaine x 2 mL, then inserted into the subacromial area. After negative draw back for blood, a solution containing 1 mL of 6 mg per ML betamethasone and 4 mL of 1% lidocaine was injected. A band aid was applied. The patient tolerated the procedure well. Post procedure instructions were given.

## 2023-04-05 ENCOUNTER — Other Ambulatory Visit: Payer: Self-pay

## 2023-04-10 ENCOUNTER — Other Ambulatory Visit: Payer: Self-pay

## 2023-04-10 ENCOUNTER — Other Ambulatory Visit: Payer: Self-pay | Admitting: Student

## 2023-04-10 DIAGNOSIS — E119 Type 2 diabetes mellitus without complications: Secondary | ICD-10-CM

## 2023-04-10 DIAGNOSIS — F320 Major depressive disorder, single episode, mild: Secondary | ICD-10-CM

## 2023-04-10 MED ORDER — LIDOCAINE 5 % EX PTCH
1.0000 | MEDICATED_PATCH | CUTANEOUS | 0 refills | Status: DC
  Filled 2023-04-10: qty 30, 30d supply, fill #0

## 2023-04-10 MED ORDER — OZEMPIC (1 MG/DOSE) 4 MG/3ML ~~LOC~~ SOPN
1.0000 mg | PEN_INJECTOR | SUBCUTANEOUS | 0 refills | Status: DC
  Filled 2023-04-10: qty 3, 28d supply, fill #0

## 2023-04-10 MED ORDER — ESCITALOPRAM OXALATE 5 MG PO TABS
5.0000 mg | ORAL_TABLET | Freq: Every day | ORAL | 0 refills | Status: DC
  Filled 2023-04-10: qty 60, 60d supply, fill #0

## 2023-04-11 ENCOUNTER — Other Ambulatory Visit: Payer: Self-pay

## 2023-04-12 ENCOUNTER — Other Ambulatory Visit: Payer: Self-pay

## 2023-04-17 MED FILL — Chlorthalidone Tab 25 MG: ORAL | 90 days supply | Qty: 90 | Fill #1 | Status: AC

## 2023-04-20 ENCOUNTER — Other Ambulatory Visit: Payer: Self-pay

## 2023-04-20 ENCOUNTER — Other Ambulatory Visit (HOSPITAL_BASED_OUTPATIENT_CLINIC_OR_DEPARTMENT_OTHER): Payer: Self-pay | Admitting: Cardiovascular Disease

## 2023-05-02 ENCOUNTER — Other Ambulatory Visit: Payer: Self-pay | Admitting: Student

## 2023-05-02 ENCOUNTER — Other Ambulatory Visit (HOSPITAL_BASED_OUTPATIENT_CLINIC_OR_DEPARTMENT_OTHER): Payer: Self-pay | Admitting: Cardiovascular Disease

## 2023-05-02 DIAGNOSIS — E119 Type 2 diabetes mellitus without complications: Secondary | ICD-10-CM

## 2023-05-03 ENCOUNTER — Other Ambulatory Visit: Payer: Self-pay

## 2023-05-03 MED ORDER — METOPROLOL SUCCINATE ER 25 MG PO TB24
12.5000 mg | ORAL_TABLET | Freq: Every day | ORAL | 0 refills | Status: DC
  Filled 2023-05-03: qty 15, 30d supply, fill #0

## 2023-05-04 ENCOUNTER — Other Ambulatory Visit: Payer: Self-pay

## 2023-05-04 MED ORDER — OZEMPIC (1 MG/DOSE) 4 MG/3ML ~~LOC~~ SOPN
1.0000 mg | PEN_INJECTOR | SUBCUTANEOUS | 0 refills | Status: DC
  Filled 2023-05-04: qty 3, 28d supply, fill #0

## 2023-05-09 NOTE — Progress Notes (Signed)
Subjective:    Patient ID: [name] [name], female    DOB: 09/23/1955, 67 y.o.   MRN: [id number]  HPI: [name] [name] is a 67 y.o. female who returns for follow up appointment for chronic pain and medication refill. She states her pain is located in her lower back. She rates her pain 8. Her current exercise regime is walking and performing stretching exercises.  [name] Morphine equivalent is 75.00 MME.   Last UDS was performed on 03/14/2023, it was consistent.      Pain Inventory Average Pain 8 Pain Right Now 8 My pain is constant, sharp, and aching  In the last 24 hours, has pain interfered with the following? General activity 8 Relation with others 8 Enjoyment of life 8 What TIME of day is your pain at its worst? morning , daytime, evening, night, and varies Sleep (in general) Fair  Pain is worse with: walking and some activites Pain improves with: heat/ice, medication, and TENS Relief from Meds: 2  Family History  Problem Relation Age of Onset   Hyperlipidemia Mother    Heart attack Mother    Heart disease Mother 84       CHF; CAD   Hypertension Mother    Heart failure Mother    Diabetes Sister    Lupus Sister    Kidney failure Sister    Hyperlipidemia Sister    Hypertension Sister    Stroke Maternal Aunt    Hypertension Brother    Colon cancer Neg Hx    Rectal cancer Neg Hx    Breast cancer Neg Hx    Social History   Socioeconomic History   Marital status: Widowed    Spouse name: Not on file   Number of children: 3   Years of education: Not on file   Highest education level: Associate degree: occupational, Scientist, product/process development, or vocational program  Occupational History   Occupation: disability    Comment: 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  Tobacco Use   Smoking status: Former    Current packs/day: 0.00    Average packs/day: 0.5 packs/day for 20.0 years (10.0 ttl pk-yrs)    Types: Cigarettes    Start date: 07/21/1988    Quit date: 07/21/2008    Years since  quitting: 14.8   Smokeless tobacco: Never  Vaping Use   Vaping status: Never Used  Substance and Sexual Activity   Alcohol use: No   Drug use: No   Sexual activity: Not on file  Other Topics Concern   Not on file  Social History Narrative   Marital status: widowed since 2002; not dating which is bad in 2018      Children: 3 children (45, 77, 40); 13 grandchildren; 1 gg      Employment: disability since 2009; retail      Lives: alone in [place] in apartment      Tobacco: quit in 2011      Alcohol: none      Drugs: none      Exercise: minimal in 2018      ADLs: independent with ADLs; drives      Seatbelt: 100%; no texting.   Social Determinants of Health   Financial Resource Strain: Low Risk  (10/24/2022)   Overall Financial Resource Strain (CARDIA)    Difficulty of Paying Living Expenses: Not very hard  Food Insecurity: No Food Insecurity (10/24/2022)   Hunger Vital Sign    Worried About Running Out of Food in the Last Year: Never true  Ran Out of Food in the Last Year: Never true  Transportation Needs: No Transportation Needs (10/24/2022)   PRAPARE - Administrator, Civil Service (Medical): No    Lack of Transportation (Non-Medical): No  Physical Activity: Inactive (10/24/2022)   Exercise Vital Sign    Days of Exercise per Week: 0 days    Minutes of Exercise per Session: 0 min  Stress: Stress Concern Present (10/24/2022)   [employer] of Occupational Health - Occupational Stress Questionnaire    Feeling of Stress : Very much  Social Connections: Moderately Integrated (10/24/2022)   Social Connection and Isolation Panel [NHANES]    Frequency of Communication with Friends and Family: Three times a week    Frequency of Social Gatherings with Friends and Family: Three times a week    Attends Religious Services: More than 4 times per year    Active Member of Clubs or Organizations: Yes    Attends Banker Meetings: More than 4 times per year     Marital Status: Widowed   Past Surgical History:  Procedure Laterality Date   ABDOMINAL HYSTERECTOMY  1982   DUB; ovaries intact   bladder surg  1992   bladder retraction   BREAST BIOPSY     BREAST REDUCTION SURGERY  1984   CARDIAC CATHETERIZATION  1990's   [name]   COLON SURGERY  05/11/2011   [facility] of adhesions Crohn's   MASS EXCISION Left 01/03/2018   Procedure: EXCISION LEFT UPPER BACK LIPOMA ERAS PATH;  Surgeon: Harriette Bouillon, MD;  Location: [facility];  Service: General;  Laterality: Left;   REDUCTION MAMMAPLASTY     SPINE SURGERY     5 total (1 upper, 4 lumbar)   SPINE SURGERY     nerve stimulator on right side   wrist and hand Right 07/2021   Dr. Annell Greening   Past Surgical History:  Procedure Laterality Date   ABDOMINAL HYSTERECTOMY  1982   DUB; ovaries intact   bladder surg  1992   bladder retraction   BREAST BIOPSY     BREAST REDUCTION SURGERY  1984   CARDIAC CATHETERIZATION  1990's   [name]   COLON SURGERY  05/11/2011   [facility] of adhesions Crohn's   MASS EXCISION Left 01/03/2018   Procedure: EXCISION LEFT UPPER BACK LIPOMA ERAS PATH;  Surgeon: Harriette Bouillon, MD;  Location: [facility];  Service: General;  Laterality: Left;   REDUCTION MAMMAPLASTY     SPINE SURGERY     5 total (1 upper, 4 lumbar)   SPINE SURGERY     nerve stimulator on right side   wrist and hand Right 07/2021   Dr. Annell Greening   Past Medical History:  Diagnosis Date   Anemia    Blood transfusion without reported diagnosis    Crohn's disease (HCC)    Disorder of sacroiliac joint    Exertional dyspnea 05/28/2021   Hyperlipidemia    Hypertension    Ischemic colitis (HCC)    Lumbago    Lumbar post-laminectomy syndrome    Lumbosacral neuritis    Lumbosacral radiculitis    OSA (obstructive sleep apnea) 05/28/2021   Sciatica    There were no vitals taken for this visit.  Opioid Risk Score:   Fall Risk Score:  `1  Depression screen  [facility] 2/9     04/04/2023    9:39 AM 03/14/2023    8:32 AM 02/07/2023    8:29 AM  01/16/2023    8:30 AM 12/15/2022   10:21 AM 12/12/2022    2:44 PM 11/15/2022    9:58 AM  Depression screen PHQ 2/9  Decreased Interest 0 0 0 0 0 3 0  Down, Depressed, Hopeless 0 0 0 0 0 1 0  PHQ - 2 Score 0 0 0 0 0 4 0  Altered sleeping      0   Tired, decreased energy      1   Change in appetite      3   Feeling bad or failure about yourself       0   Trouble concentrating      0   Moving slowly or fidgety/restless      0   Suicidal thoughts      0   PHQ-9 Score      8   Difficult doing work/chores      Extremely dIfficult     Review of Systems  Musculoskeletal:        RIGHT BUTTOCK PAIN & RIGHT SHOULDER  All other systems reviewed and are negative.      Objective:   Physical Exam Vitals and nursing note reviewed.  Constitutional:      Appearance: Normal appearance.  Neck:     Comments: Cervical Paraspinal Tenderness: C-5-C-6 Decreased ROM: Cervical Extension, Cervical Flexion, Cervical Left and Right Rotation Cardiovascular:     Rate and Rhythm: Normal rate and regular rhythm.     Pulses: Normal pulses.     Heart sounds: Normal heart sounds.  Pulmonary:     Effort: Pulmonary effort is normal.     Breath sounds: Normal breath sounds.  Musculoskeletal:     Comments: Normal Muscle Bulk and Muscle Testing Reveals:  Upper Extremities: Full ROM and Muscle Strength 5/5  Lumbar Hypersensitivity Lower Extremities Full ROM and Muscle Strength 5/5 Arises from Table slowly Narrow Based Gait     Skin:    General: Skin is warm and dry.  Neurological:     Mental Status: She is alert and oriented to person, place, and time.  Psychiatric:        Mood and Affect: Mood normal.        Behavior: Behavior normal.         Assessment & Plan:  1.Lumbar Postlaminectomy/ Spinal Stenosis Lumbar Region/ Post-Op Pain/Low back pain with radiating symptoms in L3-4 distribution:  continues to be limited by  pain. Refilled:  MSIR 15 mg one tablet twice a day as needed for moderate to sever pain #60 and  Morphine Sulfate ER 15 mg Q 8 hours. 05/09/2023. We will continue the opioid monitoring program, this consists of regular clinic visits, examinations, urine drug screen, pill counts as well as use of [place] Controlled Substance Reporting system. A 12 month History has been reviewed on the [place] Controlled Substance Reporting System on 05/09/2023. Encouraged to Continue exercise regime.   2. Lumbar Radiculopathy: No complaints today. Continue Topamax:.Continue to Monitor 05/09/2023 3.Chronic  Right Knee Pain: No complaints today.  Continue with Ice Therapy and  Voltaren Gel. 05/09/2023 4. Muscle Spasm: Continue current treatment: Tizanidine. 05/09/2023  5. Lipoma: S/P Left  Lipoma Excision of upper back  By Dr. Luisa Hart on 01/03/2018. Surgery Following. Continue to Monitor. 01119/2024.   6. Paresthesia of Skin: Continue Topamax. Continue to monitor. 05/09/2023 7. Adhesive Capsulitis of Right Shoulder:Right Shoulder Tendonitis Pain: . S/P Right Shoulder Cortisone Injection with Dr Wynn Banker,  no relief noted. Continue to monitor.  Continue HEP as Tolerated. 05/09/2023  8. Polyarthralgia: Continue HEP as Tolerated. Continue to Monitor.05/09/2023  9. Sacral Pain: No complaints today. Continue to Monitor. 05/09/2023 10. Right Hand Pain: S/P :Post right middle finger trigger finger release carpal tunnel release. Dr Ophelia Charter Following.  Continue to monitor. 05/09/2023  11. Left hand Pain Left Index Trigger Finger: Dr Ophelia Charter following. 05/09/2023   F/U in 1 month

## 2023-05-11 ENCOUNTER — Other Ambulatory Visit: Payer: Self-pay | Admitting: Student

## 2023-05-11 ENCOUNTER — Other Ambulatory Visit: Payer: Self-pay

## 2023-05-11 MED ORDER — LIDOCAINE 5 % EX PTCH
1.0000 | MEDICATED_PATCH | CUTANEOUS | 0 refills | Status: DC
  Filled 2023-05-11: qty 30, 30d supply, fill #0

## 2023-05-12 ENCOUNTER — Other Ambulatory Visit: Payer: Self-pay

## 2023-05-12 ENCOUNTER — Encounter: Payer: 59 | Attending: Physical Medicine & Rehabilitation | Admitting: Registered Nurse

## 2023-05-12 ENCOUNTER — Encounter: Payer: Self-pay | Admitting: Registered Nurse

## 2023-05-12 ENCOUNTER — Telehealth: Payer: Self-pay

## 2023-05-12 VITALS — BP 143/81 | HR 68 | Ht 64.0 in | Wt 212.0 lb

## 2023-05-12 DIAGNOSIS — I10 Essential (primary) hypertension: Secondary | ICD-10-CM | POA: Diagnosis not present

## 2023-05-12 DIAGNOSIS — G894 Chronic pain syndrome: Secondary | ICD-10-CM

## 2023-05-12 DIAGNOSIS — Z79891 Long term (current) use of opiate analgesic: Secondary | ICD-10-CM

## 2023-05-12 DIAGNOSIS — M4807 Spinal stenosis, lumbosacral region: Secondary | ICD-10-CM

## 2023-05-12 DIAGNOSIS — Z5181 Encounter for therapeutic drug level monitoring: Secondary | ICD-10-CM | POA: Diagnosis not present

## 2023-05-12 MED ORDER — MORPHINE SULFATE ER 15 MG PO TBCR
15.0000 mg | EXTENDED_RELEASE_TABLET | Freq: Three times a day (TID) | ORAL | 0 refills | Status: DC
  Filled 2023-05-12: qty 90, 30d supply, fill #0

## 2023-05-12 MED ORDER — MORPHINE SULFATE 15 MG PO TABS
15.0000 mg | ORAL_TABLET | Freq: Two times a day (BID) | ORAL | 0 refills | Status: DC | PRN
  Filled 2023-05-12: qty 60, 30d supply, fill #0

## 2023-05-12 NOTE — Patient Outreach (Signed)
Attempted to contact patient regarding DM eye. Left voicemail for patient to return my call at [phone].  Nicholes Rough, CMA Care Guide [employer]

## 2023-05-21 ENCOUNTER — Other Ambulatory Visit: Payer: Self-pay | Admitting: Student

## 2023-05-21 ENCOUNTER — Other Ambulatory Visit: Payer: Self-pay

## 2023-05-21 DIAGNOSIS — E119 Type 2 diabetes mellitus without complications: Secondary | ICD-10-CM

## 2023-05-22 ENCOUNTER — Other Ambulatory Visit: Payer: Self-pay

## 2023-05-22 MED FILL — Losartan Potassium Tab 25 MG: ORAL | 60 days supply | Qty: 30 | Fill #0 | Status: AC

## 2023-05-28 ENCOUNTER — Other Ambulatory Visit (HOSPITAL_BASED_OUTPATIENT_CLINIC_OR_DEPARTMENT_OTHER): Payer: Self-pay | Admitting: Cardiovascular Disease

## 2023-05-29 ENCOUNTER — Other Ambulatory Visit: Payer: Self-pay

## 2023-05-29 MED ORDER — METOPROLOL SUCCINATE ER 25 MG PO TB24
12.5000 mg | ORAL_TABLET | Freq: Every day | ORAL | 0 refills | Status: DC
  Filled 2023-05-29: qty 15, 30d supply, fill #0

## 2023-05-31 ENCOUNTER — Other Ambulatory Visit: Payer: Self-pay | Admitting: Student

## 2023-05-31 DIAGNOSIS — E119 Type 2 diabetes mellitus without complications: Secondary | ICD-10-CM

## 2023-06-01 ENCOUNTER — Other Ambulatory Visit: Payer: Self-pay

## 2023-06-01 MED ORDER — OZEMPIC (1 MG/DOSE) 4 MG/3ML ~~LOC~~ SOPN
1.0000 mg | PEN_INJECTOR | SUBCUTANEOUS | 0 refills | Status: DC
  Filled 2023-06-01: qty 3, 28d supply, fill #0

## 2023-06-06 ENCOUNTER — Other Ambulatory Visit: Payer: Self-pay

## 2023-06-06 ENCOUNTER — Other Ambulatory Visit: Payer: Self-pay | Admitting: Student

## 2023-06-06 DIAGNOSIS — F320 Major depressive disorder, single episode, mild: Secondary | ICD-10-CM

## 2023-06-06 NOTE — Progress Notes (Unsigned)
Subjective:    Patient ID: [name] [name], female    DOB: Aug 03, 1955, 67 y.o.   MRN: [id number]  HPI: [name] [name] is a 67 y.o. female who returns for follow up appointment for chronic pain and medication refill. states *** pain is located in  ***. rates pain ***. current exercise regime is walking and performing stretching exercises.  [name] Morphine equivalent is *** MME.   Last UDS was Performed on 03/14/2023, it was consistent.      Pain Inventory Average Pain 8 Pain Right Now 8 My pain is constant, sharp, and aching  In the last 24 hours, has pain interfered with the following? General activity 8 Relation with others 8 Enjoyment of life 8 What TIME of day is your pain at its worst? morning , daytime, and night Sleep (in general) Poor  Pain is worse with: walking and some activites Pain improves with: heat/ice, medication, and TENS Relief from Meds: 2  Family History  Problem Relation Age of Onset   Hyperlipidemia Mother    Heart attack Mother    Heart disease Mother 47       CHF; CAD   Hypertension Mother    Heart failure Mother    Diabetes Sister    Lupus Sister    Kidney failure Sister    Hyperlipidemia Sister    Hypertension Sister    Stroke Maternal Aunt    Hypertension Brother    Colon cancer Neg Hx    Rectal cancer Neg Hx    Breast cancer Neg Hx    Social History   Socioeconomic History   Marital status: Widowed    Spouse name: Not on file   Number of children: 3   Years of education: Not on file   Highest education level: Associate degree: occupational, Scientist, product/process development, or vocational program  Occupational History   Occupation: disability    Comment: 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  Tobacco Use   Smoking status: Former    Current packs/day: 0.00    Average packs/day: 0.5 packs/day for 20.0 years (10.0 ttl pk-yrs)    Types: Cigarettes    Start date: 07/21/1988    Quit date: 07/21/2008    Years since quitting: 14.8   Smokeless tobacco: Never  Vaping  Use   Vaping status: Never Used  Substance and Sexual Activity   Alcohol use: No   Drug use: No   Sexual activity: Not on file  Other Topics Concern   Not on file  Social History Narrative   Marital status: widowed since 2002; not dating which is bad in 2018      Children: 3 children (45, 30, 40); 13 grandchildren; 1 gg      Employment: disability since 2009; retail      Lives: alone in [place] in apartment      Tobacco: quit in 2011      Alcohol: none      Drugs: none      Exercise: minimal in 2018      ADLs: independent with ADLs; drives      Seatbelt: 100%; no texting.   Social Drivers of Corporate investment banker Strain: Low Risk  (10/24/2022)   Overall Financial Resource Strain (CARDIA)    Difficulty of Paying Living Expenses: Not very hard  Food Insecurity: No Food Insecurity (10/24/2022)   Hunger Vital Sign    Worried About Running Out of Food in the Last Year: Never true    Ran Out of Food  in the Last Year: Never true  Transportation Needs: No Transportation Needs (10/24/2022)   PRAPARE - Administrator, Civil Service (Medical): No    Lack of Transportation (Non-Medical): No  Physical Activity: Inactive (10/24/2022)   Exercise Vital Sign    Days of Exercise per Week: 0 days    Minutes of Exercise per Session: 0 min  Stress: Stress Concern Present (10/24/2022)   [employer] of Occupational Health - Occupational Stress Questionnaire    Feeling of Stress : Very much  Social Connections: Moderately Integrated (10/24/2022)   Social Connection and Isolation Panel [NHANES]    Frequency of Communication with Friends and Family: Three times a week    Frequency of Social Gatherings with Friends and Family: Three times a week    Attends Religious Services: More than 4 times per year    Active Member of Clubs or Organizations: Yes    Attends Banker Meetings: More than 4 times per year    Marital Status: Widowed   Past Surgical History:  Procedure  Laterality Date   ABDOMINAL HYSTERECTOMY  1982   DUB; ovaries intact   bladder surg  1992   bladder retraction   BREAST BIOPSY     BREAST REDUCTION SURGERY  1984   CARDIAC CATHETERIZATION  1990's   [name]   COLON SURGERY  05/11/2011   [facility] adhesions Crohn's   MASS EXCISION Left 01/03/2018   Procedure: EXCISION LEFT UPPER BACK LIPOMA ERAS PATH;  Surgeon: Harriette Bouillon, MD;  Location: [facility];  Service: General;  Laterality: Left;   REDUCTION MAMMAPLASTY     SPINE SURGERY     5 total (1 upper, 4 lumbar)   SPINE SURGERY     nerve stimulator on right side   wrist and hand Right 07/2021   Dr. Annell Greening   Past Surgical History:  Procedure Laterality Date   ABDOMINAL HYSTERECTOMY  1982   DUB; ovaries intact   bladder surg  1992   bladder retraction   BREAST BIOPSY     BREAST REDUCTION SURGERY  1984   CARDIAC CATHETERIZATION  1990's   [name]   COLON SURGERY  05/11/2011   [facility] adhesions Crohn's   MASS EXCISION Left 01/03/2018   Procedure: EXCISION LEFT UPPER BACK LIPOMA ERAS PATH;  Surgeon: Harriette Bouillon, MD;  Location:  SURGERY CENTER;  Service: General;  Laterality: Left;   REDUCTION MAMMAPLASTY     SPINE SURGERY     5 total (1 upper, 4 lumbar)   SPINE SURGERY     nerve stimulator on right side   wrist and hand Right 07/2021   Dr. Annell Greening   Past Medical History:  Diagnosis Date   Anemia    Blood transfusion without reported diagnosis    Crohn's disease (HCC)    Disorder of sacroiliac joint    Exertional dyspnea 05/28/2021   Hyperlipidemia    Hypertension    Ischemic colitis (HCC)    Lumbago    Lumbar post-laminectomy syndrome    Lumbosacral neuritis    Lumbosacral radiculitis    OSA (obstructive sleep apnea) 05/28/2021   Sciatica    There were no vitals taken for this visit.  Opioid Risk Score:   Fall Risk Score:  `1  Depression screen [facility] 2/9     05/12/2023    8:25 AM 04/04/2023    9:39 AM  03/14/2023    8:32 AM 02/07/2023  8:29 AM 01/16/2023    8:30 AM 12/15/2022   10:21 AM 12/12/2022    2:44 PM  Depression screen PHQ 2/9  Decreased Interest 0 0 0 0 0 0 3  Down, Depressed, Hopeless 0 0 0 0 0 0 1  PHQ - 2 Score 0 0 0 0 0 0 4  Altered sleeping       0  Tired, decreased energy       1  Change in appetite       3  Feeling bad or failure about yourself        0  Trouble concentrating       0  Moving slowly or fidgety/restless       0  Suicidal thoughts       0  PHQ-9 Score       8  Difficult doing work/chores       Extremely dIfficult    Review of Systems  Musculoskeletal:  Positive for back pain.  All other systems reviewed and are negative.     Objective:   Physical Exam        Assessment & Plan:  1.Lumbar Postlaminectomy/ Spinal Stenosis Lumbar Region/ Post-Op Pain/Low back pain with radiating symptoms in L3-4 distribution:  continues to be limited by pain. Refilled:  MSIR 15 mg one tablet twice a day as needed for moderate to sever pain #60 and  Morphine Sulfate ER 15 mg Q 8 hours. 05/09/2023. We will continue the opioid monitoring program, this consists of regular clinic visits, examinations, urine drug screen, pill counts as well as use of [place] Controlled Substance Reporting system. A 12 month History has been reviewed on the [place] Controlled Substance Reporting System on 05/09/2023. Encouraged to Continue exercise regime.   2. Lumbar Radiculopathy: No complaints today. Continue Topamax:.Continue to Monitor 05/09/2023 3.Chronic  Right Knee Pain: No complaints today.  Continue with Ice Therapy and  Voltaren Gel. 05/09/2023 4. Muscle Spasm: Continue current treatment: Tizanidine. 05/09/2023  5. Lipoma: S/P Left  Lipoma Excision of upper back  By Dr. Luisa Hart on 01/03/2018. Surgery Following. Continue to Monitor. 01119/2024.   6. Paresthesia of Skin: Continue Topamax. Continue to monitor. 05/09/2023 7. Adhesive Capsulitis of Right Shoulder:Right  Shoulder Tendonitis Pain: . S/P Right Shoulder Cortisone Injection with Dr Wynn Banker,  no relief noted. Continue to monitor. Continue HEP as Tolerated. 05/09/2023  8. Polyarthralgia: Continue HEP as Tolerated. Continue to Monitor.05/09/2023  9. Sacral Pain: No complaints today. Continue to Monitor. 05/09/2023 10. Right Hand Pain: S/P :Post right middle finger trigger finger release carpal tunnel release. Dr Ophelia Charter Following.  Continue to monitor. 05/09/2023  11. Left hand Pain Left Index Trigger Finger: Dr Ophelia Charter following. 05/09/2023   F/U in 1 month

## 2023-06-07 ENCOUNTER — Other Ambulatory Visit: Payer: Self-pay | Admitting: Student

## 2023-06-07 ENCOUNTER — Encounter: Payer: 59 | Attending: Physical Medicine & Rehabilitation | Admitting: Registered Nurse

## 2023-06-07 ENCOUNTER — Other Ambulatory Visit: Payer: Self-pay

## 2023-06-07 VITALS — Ht 64.0 in | Wt 212.0 lb

## 2023-06-07 DIAGNOSIS — Z5181 Encounter for therapeutic drug level monitoring: Secondary | ICD-10-CM | POA: Diagnosis not present

## 2023-06-07 DIAGNOSIS — M4807 Spinal stenosis, lumbosacral region: Secondary | ICD-10-CM | POA: Diagnosis not present

## 2023-06-07 DIAGNOSIS — G894 Chronic pain syndrome: Secondary | ICD-10-CM | POA: Insufficient documentation

## 2023-06-07 DIAGNOSIS — Z79891 Long term (current) use of opiate analgesic: Secondary | ICD-10-CM | POA: Insufficient documentation

## 2023-06-07 MED ORDER — ESCITALOPRAM OXALATE 5 MG PO TABS
5.0000 mg | ORAL_TABLET | Freq: Every day | ORAL | 0 refills | Status: DC
  Filled 2023-06-07: qty 60, 60d supply, fill #0

## 2023-06-07 MED ORDER — MORPHINE SULFATE 15 MG PO TABS
15.0000 mg | ORAL_TABLET | Freq: Two times a day (BID) | ORAL | 0 refills | Status: DC | PRN
  Filled 2023-06-07 – 2023-06-09 (×3): qty 60, 30d supply, fill #0

## 2023-06-07 MED ORDER — MORPHINE SULFATE ER 15 MG PO TBCR
15.0000 mg | EXTENDED_RELEASE_TABLET | Freq: Three times a day (TID) | ORAL | 0 refills | Status: DC
  Filled 2023-06-07 (×2): qty 90, 30d supply, fill #0

## 2023-06-08 ENCOUNTER — Other Ambulatory Visit: Payer: Self-pay

## 2023-06-08 ENCOUNTER — Encounter: Payer: Self-pay | Admitting: Registered Nurse

## 2023-06-08 ENCOUNTER — Other Ambulatory Visit: Payer: Self-pay | Admitting: Student

## 2023-06-08 MED FILL — Lidocaine Patch 5%: CUTANEOUS | 30 days supply | Qty: 30 | Fill #0 | Status: AC

## 2023-06-09 ENCOUNTER — Other Ambulatory Visit: Payer: Self-pay

## 2023-06-12 ENCOUNTER — Other Ambulatory Visit: Payer: Self-pay

## 2023-06-13 ENCOUNTER — Other Ambulatory Visit: Payer: Self-pay

## 2023-06-15 ENCOUNTER — Other Ambulatory Visit: Payer: Self-pay

## 2023-06-15 ENCOUNTER — Telehealth: Payer: Self-pay | Admitting: Registered Nurse

## 2023-06-15 MED ORDER — MORPHINE SULFATE ER 15 MG PO TBCR: 15.0000 mg | EXTENDED_RELEASE_TABLET | Freq: Three times a day (TID) | ORAL | 0 refills | Status: DC

## 2023-06-15 NOTE — Telephone Encounter (Signed)
Issue with prescription please call- voicemail was left

## 2023-06-15 NOTE — Telephone Encounter (Signed)
PMP was Reviewed.  MS Contin ER on back order at [facility].  MS Contin ER prescription sent to [facility]. Ms. Gieck was called and aware of the above, she verbalizes understanding.

## 2023-06-15 NOTE — Telephone Encounter (Signed)
Call placed to pharmacy, All [facility] pharmacies are on backorder with all manufacturers until the end of this month. [facility] has the medication in stock but patient says it is to far, pharmacy told the patient to see if provider is willing to send something else in.

## 2023-06-28 ENCOUNTER — Other Ambulatory Visit: Payer: Self-pay | Admitting: Student

## 2023-06-28 ENCOUNTER — Other Ambulatory Visit: Payer: Self-pay

## 2023-06-28 ENCOUNTER — Other Ambulatory Visit (HOSPITAL_BASED_OUTPATIENT_CLINIC_OR_DEPARTMENT_OTHER): Payer: Self-pay | Admitting: Cardiovascular Disease

## 2023-06-28 DIAGNOSIS — E119 Type 2 diabetes mellitus without complications: Secondary | ICD-10-CM

## 2023-06-28 MED ORDER — OZEMPIC (1 MG/DOSE) 4 MG/3ML ~~LOC~~ SOPN
1.0000 mg | PEN_INJECTOR | SUBCUTANEOUS | 0 refills | Status: DC
  Filled 2023-06-28: qty 3, 28d supply, fill #0

## 2023-06-29 ENCOUNTER — Ambulatory Visit (INDEPENDENT_AMBULATORY_CARE_PROVIDER_SITE_OTHER): Payer: 59 | Admitting: Student

## 2023-06-29 VITALS — BP 128/76 | HR 90 | Ht 64.0 in | Wt 221.0 lb

## 2023-06-29 DIAGNOSIS — J069 Acute upper respiratory infection, unspecified: Secondary | ICD-10-CM | POA: Diagnosis not present

## 2023-06-29 DIAGNOSIS — R1030 Lower abdominal pain, unspecified: Secondary | ICD-10-CM

## 2023-06-29 DIAGNOSIS — Z23 Encounter for immunization: Secondary | ICD-10-CM | POA: Diagnosis not present

## 2023-06-29 NOTE — Progress Notes (Signed)
  SUBJECTIVE:   CHIEF COMPLAINT / HPI:   Congestion and sore throat  Started Sunday am with nose burning, throat then got sore and voice got hoarse. Congestion has gotten better, but voice continues to go in/out.   Stomach aches Happens at night, some nausea, no vomit, no fever. This started on Monday. Lower abdomen, crampy pain like stomach virus. Get's little pellets with bowel movements, last BM was yesterday. Pain can last hours, but usually eases off by the morning. Stomach get's upset if eating after night > get's stomach cramps/gas/rough bowel movement.   PERTINENT  PMH / PSH:    OBJECTIVE:  There were no vitals taken for this visit. Physical Exam Constitutional:      General: She is not in acute distress.    Appearance: She is well-developed. She is not ill-appearing.  HENT:     Mouth/Throat:     Mouth: Mucous membranes are moist. No oral lesions.     Pharynx: Oropharynx is clear. No pharyngeal swelling, oropharyngeal exudate, posterior oropharyngeal erythema or uvula swelling.  Abdominal:     General: There is no distension.     Palpations: Abdomen is soft. There is no mass.     Tenderness: There is no abdominal tenderness. There is no guarding or rebound.  Neurological:     Mental Status: She is alert.      ASSESSMENT/PLAN:   Assessment & Plan Viral upper respiratory tract infection Patient comes in for concern of congestion, sore throat, that began last weekend.  Patient appreciates symptoms have mostly resolved however throat is still hoarse intermittently.  Patient denies ever having any fevers or systemic symptoms.  Given that symptoms are improving, most likely patient had viral URI, low concern for bacterial infection as she is improving.  No need for antibiotics will recommend close follow-up if not improving. - Follow-up as needed Lower abdominal pain Patient appreciates lower abdominal pain that is crampy in nature and that began around Monday.  Patient  appreciates no association with bowel movements, or eating for making pain better or worse.  Patient appreciates she has small pellet-like bowel movements.  Patient appreciates stomach pain is mostly during the evening, while in bed, and will resolve on its own.  Mostly considered constipation given bowel movement characteristics, also considered IBS but lower concern given lack of diarrhea, could be source of constipation.  Will recommend MiraLAX and follow-up as needed - MiraLAX daily, increase as needed - Follow-up if symptoms do not improve No follow-ups on file. Penne Rhein, MD 06/29/2023, 6:19 AM PGY-3, [facility]

## 2023-06-29 NOTE — Assessment & Plan Note (Addendum)
 Patient comes in for concern of congestion, sore throat, that began last weekend.  Patient appreciates symptoms have mostly resolved however throat is still hoarse intermittently.  Patient denies ever having any fevers or systemic symptoms.  Given that symptoms are improving, most likely patient had viral URI, low concern for bacterial infection as she is improving.  No need for antibiotics will recommend close follow-up if not improving. - Follow-up as needed

## 2023-06-29 NOTE — Assessment & Plan Note (Addendum)
 Patient appreciates lower abdominal pain that is crampy in nature and that began around Monday.  Patient appreciates no association with bowel movements, or eating for making pain better or worse.  Patient appreciates she has small pellet-like bowel movements.  Patient appreciates stomach pain is mostly during the evening, while in bed, and will resolve on its own.  Mostly considered constipation given bowel movement characteristics, also considered IBS but lower concern given lack of diarrhea, could be source of constipation.  Will recommend MiraLAX and follow-up as needed - MiraLAX daily, increase as needed - Follow-up if symptoms do not improve

## 2023-06-29 NOTE — Patient Instructions (Addendum)
 It was great to see you! Thank you for allowing me to participate in your care!  I recommend that you always bring your medications to each appointment as this makes it easy to ensure we are on the correct medications and helps us  not miss when refills are needed.  Our plans for today:  - Upper Respiratory Infection  It sounds like you caught a virus, and you are likely getting over it. You should continue to feel better. If you get worse over the next week, make a follow up appointment  - Stomach  I think your stomach issues are coming from Constipation. I recommend you take Miralax daily until having fully formed, smooth bowel movements. It takes 3-5 days to start working.  If no smooth/formed/bowel movement after 5 days, you can increase the Miralax to twice a day (morning and evening). If you start having watery stools, you can back off to every other day or so.   Take care and seek immediate care sooner if you develop any concerns.   Dr. Penne Rhein, MD [facility]

## 2023-06-30 ENCOUNTER — Telehealth: Payer: Self-pay | Admitting: Student

## 2023-06-30 ENCOUNTER — Other Ambulatory Visit: Payer: Self-pay

## 2023-06-30 ENCOUNTER — Other Ambulatory Visit: Payer: Self-pay | Admitting: Student

## 2023-06-30 MED ORDER — CETIRIZINE HCL 10 MG PO TABS
10.0000 mg | ORAL_TABLET | Freq: Every day | ORAL | 11 refills | Status: DC | PRN
  Filled 2023-06-30: qty 30, 30d supply, fill #0

## 2023-06-30 NOTE — Progress Notes (Signed)
 Patient calls into RN line requesting abx for worsening symptoms. Called Pt back and she reports that her nose is running more and her eyes are watering and she's sneezing which is making her throat hurt. She continues to check her temperature and has had no fevers.   Discussed trying allergy meds to see if symptoms would improve, and going to UC over the weekend if needed. Also made appt for Monday afternoon with PCP for f/u if not feeling better.   Sent Rx for Zyrtec  in, patient already taking flonase 

## 2023-06-30 NOTE — Telephone Encounter (Signed)
 Patient calls into RN line requesting abx for worsening symptoms.   I saw patient yesterday in clinic and she reported that her symptoms had been improving.   Called Pt back and she reports that her nose is running more and her eyes are watering and she's sneezing which is making her throat hurt. She continues to check her temperature and has had no fevers.   Discussed trying allergy meds to see if symptoms would improve, and going to UC over the weekend if needed. Also made appt for Monday afternoon with PCP for f/u if not feeling better.   Sent Rx for Zyrtec  in, patient already taking flonase 

## 2023-07-02 ENCOUNTER — Other Ambulatory Visit (HOSPITAL_BASED_OUTPATIENT_CLINIC_OR_DEPARTMENT_OTHER): Payer: Self-pay | Admitting: Cardiovascular Disease

## 2023-07-03 ENCOUNTER — Other Ambulatory Visit: Payer: Self-pay

## 2023-07-03 MED ORDER — METOPROLOL SUCCINATE ER 25 MG PO TB24
12.5000 mg | ORAL_TABLET | Freq: Every day | ORAL | 0 refills | Status: DC
  Filled 2023-07-03: qty 10, 20d supply, fill #0

## 2023-07-05 ENCOUNTER — Other Ambulatory Visit: Payer: Self-pay

## 2023-07-05 ENCOUNTER — Telehealth: Payer: Self-pay | Admitting: Registered Nurse

## 2023-07-05 MED ORDER — MORPHINE SULFATE 15 MG PO TABS
15.0000 mg | ORAL_TABLET | Freq: Two times a day (BID) | ORAL | 0 refills | Status: DC | PRN
  Filled 2023-07-05 – 2023-07-07 (×2): qty 60, 30d supply, fill #0

## 2023-07-05 MED ORDER — MORPHINE SULFATE ER 15 MG PO TBCR
15.0000 mg | EXTENDED_RELEASE_TABLET | Freq: Three times a day (TID) | ORAL | 0 refills | Status: DC
  Filled 2023-07-05 – 2023-07-13 (×5): qty 90, 30d supply, fill #0

## 2023-07-05 NOTE — Addendum Note (Signed)
 Addended by: Jodi Munroe on: 07/05/2023 03:57 PM   Modules accepted: Orders

## 2023-07-05 NOTE — Telephone Encounter (Signed)
 PMP was Reviewed.  MS Contin  and MSIR e-scribed to pharmacy  Ms. Caccamo is aware via My- Chart message.

## 2023-07-05 NOTE — Telephone Encounter (Signed)
 Patient is sick and we rescheduled appointment in February, she will run out of her medications she said one runs out on the 20th of this month and the 26th of this month (Morphine ).

## 2023-07-06 ENCOUNTER — Encounter: Payer: 59 | Attending: Physical Medicine & Rehabilitation | Admitting: Registered Nurse

## 2023-07-07 ENCOUNTER — Other Ambulatory Visit: Payer: Self-pay

## 2023-07-07 ENCOUNTER — Other Ambulatory Visit: Payer: Self-pay | Admitting: Student

## 2023-07-07 MED ORDER — LIDOCAINE 5 % EX PTCH
1.0000 | MEDICATED_PATCH | CUTANEOUS | 0 refills | Status: DC
  Filled 2023-07-07: qty 23, 23d supply, fill #0
  Filled 2023-07-07: qty 7, 7d supply, fill #0

## 2023-07-10 ENCOUNTER — Other Ambulatory Visit: Payer: Self-pay

## 2023-07-10 ENCOUNTER — Ambulatory Visit: Payer: 59 | Admitting: Student

## 2023-07-10 ENCOUNTER — Ambulatory Visit
Admission: RE | Admit: 2023-07-10 | Discharge: 2023-07-10 | Disposition: A | Discharge status: Home / Self Care | Payer: 59 | Source: Ambulatory Visit | Attending: Family Medicine | Admitting: Family Medicine

## 2023-07-10 ENCOUNTER — Ambulatory Visit
Admission: RE | Admit: 2023-07-10 | Discharge: 2023-07-10 | Disposition: A | Discharge status: Home / Self Care | Payer: 59 | Source: Ambulatory Visit | Attending: Student | Admitting: Student

## 2023-07-10 VITALS — BP 132/76 | HR 70 | Temp 98.1°F | Ht 64.0 in | Wt 222.6 lb

## 2023-07-10 DIAGNOSIS — R509 Fever, unspecified: Secondary | ICD-10-CM | POA: Diagnosis not present

## 2023-07-10 DIAGNOSIS — J40 Bronchitis, not specified as acute or chronic: Secondary | ICD-10-CM

## 2023-07-10 DIAGNOSIS — R059 Cough, unspecified: Secondary | ICD-10-CM | POA: Diagnosis not present

## 2023-07-10 DIAGNOSIS — R0981 Nasal congestion: Secondary | ICD-10-CM | POA: Diagnosis not present

## 2023-07-10 DIAGNOSIS — R0989 Other specified symptoms and signs involving the circulatory and respiratory systems: Secondary | ICD-10-CM | POA: Insufficient documentation

## 2023-07-10 MED ORDER — AMOXICILLIN-POT CLAVULANATE 875-125 MG PO TABS
1.0000 | ORAL_TABLET | Freq: Two times a day (BID) | ORAL | 0 refills | Status: AC
  Filled 2023-07-10: qty 10, 5d supply, fill #0

## 2023-07-10 MED ORDER — OXYMETAZOLINE HCL 0.05 % NA SOLN
1.0000 | Freq: Two times a day (BID) | NASAL | 0 refills | Status: DC
  Filled 2023-07-10: qty 6, 30d supply, fill #0

## 2023-07-10 NOTE — Patient Instructions (Signed)
It was great to see you! Thank you for allowing me to participate in your care!   Our plans for today:  - I have sent in augmentin to take 2 times daily for 5 days - I recommend getting chest xray to assess for pneumonia - afrin nasal spray for 3 days only (not longer than this) - If not improving or having shortness of breath, fevers, return to care  Take care and seek immediate care sooner if you develop any concerns.  Levin Erp, MD

## 2023-07-10 NOTE — Progress Notes (Signed)
    SUBJECTIVE:   CHIEF COMPLAINT / HPI: Illness f/u  Seen 1/9 for congestion and sore throat thought to be related to viral URI.   Since then has been having persistent upper respiratory symptoms.  She initially experienced symptoms of a head cold, including nasal burning and congestion.  Despite attempts to expectorate, she has been unable to clear the thick phlegm.   After the initial visit, her symptoms got better and then worsened.  She began sneezing and experienced a resurgence of head congestion, as if a new cold was starting. She was prescribed Zyrtec and recommended continued use of Flonase nasal spray.  Despite these interventions her symptoms persisted.  She described the phlegm as thick and difficult to expectorate, often causing a gag reflex but not resulting in productive cough.  She also experienced fluctuating fevers, ranging from 99 to 102 degrees Fahrenheit, with the last fever of 101 or 102 occurring three days prior to today  She has a history of smoking, starting at age 26 and quitting in 2011. 1/2 PPD. She has a history of lung nodules, last evaluated in February 2024 which appeared benign. CT showed mild centrilobular emphysema. She has not undergone pulmonary function testing or been evaluated for COPD or asthma. No inhalers at home  PERTINENT  PMH / PSH: former smoker, chronic pain on opioids  OBJECTIVE:   BP (!) 148/60   Pulse 70   Temp 98.1 F (36.7 C)   Ht 5\' 4"  (1.626 m)   Wt 222 lb 9.6 oz (101 kg)   SpO2 97%   BMI 38.21 kg/m   General: Well appearing, NAD, awake, alert, responsive to questions Head: Normocephalic atraumatic, severe nasal congestion bilaterally, no tenderness to palpation of frontal or maxillary sinuses, no oropharyngeal exudates, no cervical adenopathy or masses CV: Regular rate and rhythm no murmurs rubs or gallops Respiratory: Coarse crackles throughout posterior lung fields, chest rises symmetrically,  no increased work of  breathing on room air ASSESSMENT/PLAN:   Assessment & Plan Bronchitis Persistent congestion and cough with difficulty expectorating phlegm. Recent history of fever. No shortness of breath or hemoptysis. Long history of smoking with last lung nodule check in February 2024. -Start Augmentin twice daily for 5 days. -Order chest x-ray to rule out pneumonia -Consider PFTs in future -Use Afrin nasal spray for 3 days to help with nasal congestion -ED/return precautions discussed -Repeat Low Dose CT in February 2025 Nasal congestion Severe on exam -Afrin x 3 days   Levin Erp, MD [facility]

## 2023-07-12 ENCOUNTER — Other Ambulatory Visit: Payer: Self-pay

## 2023-07-13 ENCOUNTER — Other Ambulatory Visit: Payer: Self-pay

## 2023-07-13 ENCOUNTER — Encounter: Payer: Self-pay | Admitting: Student

## 2023-07-19 ENCOUNTER — Other Ambulatory Visit: Payer: Self-pay

## 2023-07-19 ENCOUNTER — Other Ambulatory Visit: Payer: Self-pay | Admitting: Student

## 2023-07-19 DIAGNOSIS — I10 Essential (primary) hypertension: Secondary | ICD-10-CM

## 2023-07-19 DIAGNOSIS — E119 Type 2 diabetes mellitus without complications: Secondary | ICD-10-CM

## 2023-07-19 MED ORDER — LOSARTAN POTASSIUM 25 MG PO TABS
12.5000 mg | ORAL_TABLET | Freq: Every day | ORAL | 0 refills | Status: DC
  Filled 2023-07-24: qty 30, 60d supply, fill #0

## 2023-07-19 MED ORDER — CHLORTHALIDONE 25 MG PO TABS
25.0000 mg | ORAL_TABLET | Freq: Every day | ORAL | 1 refills | Status: DC
  Filled 2023-07-24: qty 60, 60d supply, fill #0
  Filled 2023-07-24: qty 90, 90d supply, fill #0
  Filled 2023-10-18: qty 90, 90d supply, fill #1

## 2023-07-20 ENCOUNTER — Other Ambulatory Visit: Payer: Self-pay

## 2023-07-20 ENCOUNTER — Other Ambulatory Visit (HOSPITAL_BASED_OUTPATIENT_CLINIC_OR_DEPARTMENT_OTHER): Payer: Self-pay | Admitting: Cardiovascular Disease

## 2023-07-20 MED ORDER — METOPROLOL SUCCINATE ER 25 MG PO TB24
12.5000 mg | ORAL_TABLET | Freq: Every day | ORAL | 3 refills | Status: DC
  Filled 2023-07-20: qty 30, 60d supply, fill #0
  Filled 2023-09-21: qty 30, 60d supply, fill #1
  Filled 2023-11-19: qty 30, 60d supply, fill #2
  Filled 2024-01-15: qty 30, 60d supply, fill #3

## 2023-07-21 ENCOUNTER — Other Ambulatory Visit: Payer: Self-pay

## 2023-07-24 ENCOUNTER — Other Ambulatory Visit: Payer: Self-pay

## 2023-07-26 ENCOUNTER — Other Ambulatory Visit: Payer: Self-pay | Admitting: Student

## 2023-07-26 DIAGNOSIS — E119 Type 2 diabetes mellitus without complications: Secondary | ICD-10-CM

## 2023-07-27 ENCOUNTER — Other Ambulatory Visit: Payer: Self-pay

## 2023-07-27 ENCOUNTER — Other Ambulatory Visit: Payer: Self-pay | Admitting: Student

## 2023-07-27 DIAGNOSIS — E119 Type 2 diabetes mellitus without complications: Secondary | ICD-10-CM

## 2023-07-27 MED FILL — Semaglutide Soln Pen-inj 1 MG/DOSE (4 MG/3ML): SUBCUTANEOUS | 28 days supply | Qty: 3 | Fill #0 | Status: AC

## 2023-07-31 ENCOUNTER — Other Ambulatory Visit: Payer: Self-pay

## 2023-08-01 ENCOUNTER — Other Ambulatory Visit: Payer: Self-pay

## 2023-08-01 ENCOUNTER — Encounter: Payer: 59 | Attending: Physical Medicine & Rehabilitation | Admitting: Registered Nurse

## 2023-08-01 ENCOUNTER — Other Ambulatory Visit: Payer: Self-pay | Admitting: Registered Nurse

## 2023-08-01 VITALS — BP 125/74 | HR 60 | Ht 64.0 in | Wt 220.0 lb

## 2023-08-01 DIAGNOSIS — G894 Chronic pain syndrome: Secondary | ICD-10-CM | POA: Insufficient documentation

## 2023-08-01 DIAGNOSIS — M4807 Spinal stenosis, lumbosacral region: Secondary | ICD-10-CM | POA: Diagnosis not present

## 2023-08-01 DIAGNOSIS — M542 Cervicalgia: Secondary | ICD-10-CM | POA: Insufficient documentation

## 2023-08-01 DIAGNOSIS — Z5181 Encounter for therapeutic drug level monitoring: Secondary | ICD-10-CM | POA: Diagnosis not present

## 2023-08-01 DIAGNOSIS — Z79891 Long term (current) use of opiate analgesic: Secondary | ICD-10-CM | POA: Insufficient documentation

## 2023-08-01 MED ORDER — MORPHINE SULFATE 15 MG PO TABS
15.0000 mg | ORAL_TABLET | Freq: Two times a day (BID) | ORAL | 0 refills | Status: DC | PRN
  Filled 2023-08-01 – 2023-08-04 (×2): qty 60, 30d supply, fill #0

## 2023-08-01 MED ORDER — MORPHINE SULFATE ER 15 MG PO TBCR
15.0000 mg | EXTENDED_RELEASE_TABLET | Freq: Three times a day (TID) | ORAL | 0 refills | Status: DC
  Filled 2023-08-01 – 2023-08-09 (×6): qty 90, 30d supply, fill #0

## 2023-08-01 NOTE — Progress Notes (Signed)
Subjective:    Patient ID: [name] [name], female    DOB: January 16, 1956, 68 y.o.   MRN: [id number]  HPI: [name] [name] is a 68 y.o. female who returns for follow up appointment for chronic pain and medication refill. She states her pain is located in her neck and lower back pain. She rates her pain 8. Her current exercise regime is walking and performing stretching exercises.   Ms. Scogin Morphine equivalent is 75.00 MME.   UDS ordered today.   Pain Inventory Average Pain 8 Pain Right Now 8 My pain is constant, sharp, and aching  In the last 24 hours, has pain interfered with the following? General activity 8 Relation with others 8 Enjoyment of life 8 What TIME of day is your pain at its worst? morning , daytime, and night Sleep (in general) Poor  Pain is worse with: walking, sitting, inactivity, and standing Pain improves with: rest, heat/ice, and medication Relief from Meds: 2  Family History  Problem Relation Age of Onset   Hyperlipidemia Mother    Heart attack Mother    Heart disease Mother 48       CHF; CAD   Hypertension Mother    Heart failure Mother    Diabetes Sister    Lupus Sister    Kidney failure Sister    Hyperlipidemia Sister    Hypertension Sister    Stroke Maternal Aunt    Hypertension Brother    Colon cancer Neg Hx    Rectal cancer Neg Hx    Breast cancer Neg Hx    Social History   Socioeconomic History   Marital status: Widowed    Spouse name: Not on file   Number of children: 3   Years of education: Not on file   Highest education level: 9th grade  Occupational History   Occupation: disability    Comment: 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  Tobacco Use   Smoking status: Former    Current packs/day: 0.00    Average packs/day: 0.5 packs/day for 20.0 years (10.0 ttl pk-yrs)    Types: Cigarettes    Start date: 07/21/1988    Quit date: 07/21/2008    Years since quitting: 15.0   Smokeless tobacco: Never  Vaping Use   Vaping status: Never Used   Substance and Sexual Activity   Alcohol use: No   Drug use: No   Sexual activity: Not on file  Other Topics Concern   Not on file  Social History Narrative   Marital status: widowed since 2002; not dating which is bad in 2018      Children: 3 children (45, 10, 62); 13 grandchildren; 1 gg      Employment: disability since 2009; retail      Lives: alone in [place] in apartment      Tobacco: quit in 2011      Alcohol: none      Drugs: none      Exercise: minimal in 2018      ADLs: independent with ADLs; drives      Seatbelt: 100%; no texting.   Social Drivers of Health   Financial Resource Strain: Medium Risk (06/28/2023)   Overall Financial Resource Strain (CARDIA)    Difficulty of Paying Living Expenses: Somewhat hard  Food Insecurity: No Food Insecurity (06/28/2023)   Hunger Vital Sign    Worried About Running Out of Food in the Last Year: Never true    Ran Out of Food in the Last Year: Never true  Transportation Needs: No Transportation Needs (06/28/2023)   PRAPARE - Administrator, Civil Service (Medical): No    Lack of Transportation (Non-Medical): No  Physical Activity: Inactive (06/28/2023)   Exercise Vital Sign    Days of Exercise per Week: 0 days    Minutes of Exercise per Session: 0 min  Stress: No Stress Concern Present (06/28/2023)   [employer] of Occupational Health - Occupational Stress Questionnaire    Feeling of Stress : Only a little  Social Connections: Unknown (06/28/2023)   Social Connection and Isolation Panel [NHANES]    Frequency of Communication with Friends and Family: Patient declined    Frequency of Social Gatherings with Friends and Family: Once a week    Attends Religious Services: More than 4 times per year    Active Member of [employer] or Organizations: Yes    Attends Banker Meetings: More than 4 times per year    Marital Status: Widowed   Past Surgical History:  Procedure Laterality Date   ABDOMINAL HYSTERECTOMY  1982    DUB; ovaries intact   bladder surg  1992   bladder retraction   BREAST BIOPSY     BREAST REDUCTION SURGERY  1984   CARDIAC CATHETERIZATION  1990's   [name]   COLON SURGERY  05/11/2011   [facility] of adhesions Crohn's   MASS EXCISION Left 01/03/2018   Procedure: EXCISION LEFT UPPER BACK LIPOMA ERAS PATH;  Surgeon: Harriette Bouillon, MD;  Location: [facility];  Service: General;  Laterality: Left;   REDUCTION MAMMAPLASTY     SPINE SURGERY     5 total (1 upper, 4 lumbar)   SPINE SURGERY     nerve stimulator on right side   wrist and hand Right 07/2021   Dr. Annell Greening   Past Surgical History:  Procedure Laterality Date   ABDOMINAL HYSTERECTOMY  1982   DUB; ovaries intact   bladder surg  1992   bladder retraction   BREAST BIOPSY     BREAST REDUCTION SURGERY  1984   CARDIAC CATHETERIZATION  1990's   [name]   COLON SURGERY  05/11/2011   [facility] of adhesions Crohn's   MASS EXCISION Left 01/03/2018   Procedure: EXCISION LEFT UPPER BACK LIPOMA ERAS PATH;  Surgeon: Harriette Bouillon, MD;  Location: [facility];  Service: General;  Laterality: Left;   REDUCTION MAMMAPLASTY     SPINE SURGERY     5 total (1 upper, 4 lumbar)   SPINE SURGERY     nerve stimulator on right side   wrist and hand Right 07/2021   Dr. Annell Greening   Past Medical History:  Diagnosis Date   Anemia    Blood transfusion without reported diagnosis    Crohn's disease (HCC)    Disorder of sacroiliac joint    Exertional dyspnea 05/28/2021   Hyperlipidemia    Hypertension    Ischemic colitis (HCC)    Lumbago    Lumbar post-laminectomy syndrome    Lumbosacral neuritis    Lumbosacral radiculitis    OSA (obstructive sleep apnea) 05/28/2021   Sciatica    There were no vitals taken for this visit.  Opioid Risk Score:   Fall Risk Score:  `1  Depression screen [facility] 2/9     07/10/2023    2:56 PM 06/29/2023    9:04 AM 05/12/2023    8:25 AM 04/04/2023    9:39 AM  03/14/2023    8:32 AM 02/07/2023  8:29 AM 01/16/2023    8:30 AM  Depression screen PHQ 2/9  Decreased Interest 1 2 0 0 0 0 0  Down, Depressed, Hopeless 1 1 0 0 0 0 0  PHQ - 2 Score 2 3 0 0 0 0 0  Altered sleeping 1 1       Tired, decreased energy 1 1       Change in appetite 1 1       Feeling bad or failure about yourself  0 0       Trouble concentrating 0 0       Moving slowly or fidgety/restless 0 0       Suicidal thoughts 0 0       PHQ-9 Score 5 6         Review of Systems  Musculoskeletal:  Positive for back pain and neck pain.       Right shoulder pain   All other systems reviewed and are negative.     Objective:   Physical Exam Vitals and nursing note reviewed.  Constitutional:      Appearance: Normal appearance.  Cardiovascular:     Rate and Rhythm: Normal rate and regular rhythm.     Pulses: Normal pulses.     Heart sounds: Normal heart sounds.     No friction rub.  Pulmonary:     Effort: Pulmonary effort is normal.     Breath sounds: Normal breath sounds.  Musculoskeletal:     Comments: Normal Muscle Bulk and Muscle Testing Reveals:  Upper Extremities: Full ROM and Muscle Strength 5/5 Lumbar Paraspinal Tenderness: L-4-L-5  Lower Extremities: Full ROM and Muscle Strength 5/5 Arises from Chair slowly  Narrow Based  Gait     Skin:    General: Skin is warm and dry.  Neurological:     Mental Status: She is alert and oriented to person, place, and time.  Psychiatric:        Mood and Affect: Mood normal.        Behavior: Behavior normal.         Assessment & Plan:  1.Lumbar Postlaminectomy/ Spinal Stenosis Lumbar Region/ Post-Op Pain/Low back pain with radiating symptoms in L3-4 distribution:  continues to be limited by pain. Refilled:  MSIR 15 mg one tablet twice a day as needed for moderate to sever pain #60 and  Morphine Sulfate ER 15 mg Q 8 hours. 08/01/2023. We will continue the opioid monitoring program, this consists of regular clinic visits,  examinations, urine drug screen, pill counts as well as use of [place] Controlled Substance Reporting system. A 12 month History has been reviewed on the [place] Controlled Substance Reporting System on 08/01/2023. Encouraged to Continue exercise regime.   2. Lumbar Radiculopathy: No complaints today. Continue Topamax:.Continue to Monitor 08/01/2023 3.Chronic  Right Knee Pain: No complaints today.  Continue with Ice Therapy and  Voltaren Gel. 08/01/2023 4. Muscle Spasm: Continue current treatment: Tizanidine. 08/01/2023  5. Lipoma: S/P Left  Lipoma Excision of upper back  By Dr. Luisa Hart on 01/03/2018. Surgery Following. Ms. Dunton was instructed to scheduled an appointment with Dr Luisa Hart, to evaluate Lipoma, she verbalizes understanding. Continue to Monitor.08/01/2023.   6. Paresthesia of Skin: Continue Topamax. Continue to monitor. 08/01/2023 7. Adhesive Capsulitis of Right Shoulder:Right Shoulder Tendonitis Pain: . S/P Right Shoulder Cortisone Injection with Dr Wynn Banker,  no relief noted. Continue to monitor. Continue HEP as Tolerated. 08/01/2023  8. Polyarthralgia: Continue HEP as Tolerated. Continue to Monitor.08/01/2023  9.  Sacral Pain: No complaints today. Continue to Monitor. 08/01/2023 10. Right Hand Pain: S/P :Post right middle finger trigger finger release carpal tunnel release. Dr Ophelia Charter Following.  Continue to monitor. 08/01/2023  11. Left hand Pain Left Index Trigger Finger: Dr Ophelia Charter following. 08/01/2023   F/U in 1 month

## 2023-08-04 ENCOUNTER — Other Ambulatory Visit: Payer: Self-pay

## 2023-08-04 LAB — TOXASSURE SELECT,+ANTIDEPR,UR

## 2023-08-05 ENCOUNTER — Other Ambulatory Visit: Payer: Self-pay | Admitting: Student

## 2023-08-05 ENCOUNTER — Other Ambulatory Visit (HOSPITAL_BASED_OUTPATIENT_CLINIC_OR_DEPARTMENT_OTHER): Payer: Self-pay | Admitting: Family

## 2023-08-05 DIAGNOSIS — F320 Major depressive disorder, single episode, mild: Secondary | ICD-10-CM

## 2023-08-07 ENCOUNTER — Other Ambulatory Visit: Payer: Self-pay | Admitting: Student

## 2023-08-07 ENCOUNTER — Other Ambulatory Visit: Payer: Self-pay

## 2023-08-07 DIAGNOSIS — F320 Major depressive disorder, single episode, mild: Secondary | ICD-10-CM

## 2023-08-07 MED ORDER — ATORVASTATIN CALCIUM 80 MG PO TABS
80.0000 mg | ORAL_TABLET | Freq: Every day | ORAL | 3 refills | Status: AC
Start: 2023-08-07 — End: 2024-08-01
  Filled 2023-08-07: qty 30, 30d supply, fill #0
  Filled 2023-09-06: qty 30, 30d supply, fill #1
  Filled 2023-10-04: qty 30, 30d supply, fill #2
  Filled 2023-11-02: qty 30, 30d supply, fill #3

## 2023-08-07 MED FILL — Escitalopram Oxalate Tab 5 MG (Base Equiv): ORAL | 60 days supply | Qty: 60 | Fill #0 | Status: AC

## 2023-08-08 ENCOUNTER — Other Ambulatory Visit: Payer: Self-pay

## 2023-08-09 ENCOUNTER — Other Ambulatory Visit: Payer: Self-pay

## 2023-08-11 ENCOUNTER — Other Ambulatory Visit: Payer: Self-pay | Admitting: Student

## 2023-08-11 ENCOUNTER — Other Ambulatory Visit: Payer: Self-pay

## 2023-08-11 MED ORDER — LIDOCAINE 5 % EX PTCH
1.0000 | MEDICATED_PATCH | CUTANEOUS | 0 refills | Status: DC
  Filled 2023-08-11: qty 30, 30d supply, fill #0

## 2023-08-15 ENCOUNTER — Ambulatory Visit
Admission: RE | Admit: 2023-08-15 | Discharge: 2023-08-15 | Disposition: A | Discharge status: Home / Self Care | Payer: 59 | Source: Ambulatory Visit | Attending: Registered Nurse | Admitting: Registered Nurse

## 2023-08-15 ENCOUNTER — Ambulatory Visit
Admission: RE | Admit: 2023-08-15 | Discharge: 2023-08-15 | Disposition: A | Discharge status: Home / Self Care | Payer: 59 | Source: Ambulatory Visit | Attending: Registered Nurse | Admitting: *Deleted

## 2023-08-15 DIAGNOSIS — M5031 Other cervical disc degeneration,  high cervical region: Secondary | ICD-10-CM | POA: Diagnosis not present

## 2023-08-15 DIAGNOSIS — M542 Cervicalgia: Secondary | ICD-10-CM | POA: Insufficient documentation

## 2023-08-15 DIAGNOSIS — Z981 Arthrodesis status: Secondary | ICD-10-CM | POA: Diagnosis not present

## 2023-08-15 DIAGNOSIS — Z4789 Encounter for other orthopedic aftercare: Secondary | ICD-10-CM | POA: Diagnosis not present

## 2023-08-15 DIAGNOSIS — M50323 Other cervical disc degeneration at C6-C7 level: Secondary | ICD-10-CM | POA: Diagnosis not present

## 2023-08-23 ENCOUNTER — Other Ambulatory Visit: Payer: Self-pay | Admitting: Student

## 2023-08-23 ENCOUNTER — Other Ambulatory Visit: Payer: Self-pay

## 2023-08-23 DIAGNOSIS — E119 Type 2 diabetes mellitus without complications: Secondary | ICD-10-CM

## 2023-08-23 MED ORDER — OZEMPIC (1 MG/DOSE) 4 MG/3ML ~~LOC~~ SOPN
1.0000 mg | PEN_INJECTOR | SUBCUTANEOUS | 0 refills | Status: DC
  Filled 2023-08-23: qty 3, 28d supply, fill #0

## 2023-08-31 ENCOUNTER — Encounter: Payer: 59 | Admitting: Registered Nurse

## 2023-08-31 NOTE — Progress Notes (Deleted)
 Subjective:    Patient ID: [name] [name], female    DOB: [birth year], 68 y.o.   MRN: [id number]  HPI   Pain Inventory Average Pain {NUMBERS; 0-10:5044} Pain Right Now {NUMBERS; 0-10:5044} My pain is {PAIN DESCRIPTION:21022940}  In the last 24 hours, has pain interfered with the following? General activity {NUMBERS; 0-10:5044} Relation with others {NUMBERS; 0-10:5044} Enjoyment of life {NUMBERS; 0-10:5044} What TIME of day is your pain at its worst? {time of day:24191} Sleep (in general) {BHH GOOD/FAIR/POOR:22877}  Pain is worse with: {ACTIVITIES:21022942} Pain improves with: {PAIN IMPROVES XBJY:78295621} Relief from Meds: {NUMBERS; 0-10:5044}  Family History  Problem Relation Age of Onset   Hyperlipidemia Mother    Heart attack Mother    Heart disease Mother 37       CHF; CAD   Hypertension Mother    Heart failure Mother    Diabetes Sister    Lupus Sister    Kidney failure Sister    Hyperlipidemia Sister    Hypertension Sister    Stroke Maternal Aunt    Hypertension Brother    Colon cancer Neg Hx    Rectal cancer Neg Hx    Breast cancer Neg Hx    Social History   Socioeconomic History   Marital status: Widowed    Spouse name: Not on file   Number of children: 3   Years of education: Not on file   Highest education level: 9th grade  Occupational History   Occupation: disability    Comment: 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  Tobacco Use   Smoking status: Former    Current packs/day: 0.00    Average packs/day: 0.5 packs/day for 20.0 years (10.0 ttl pk-yrs)    Types: Cigarettes    Start date: 07/21/1988    Quit date: 07/21/2008    Years since quitting: 15.1   Smokeless tobacco: Never  Vaping Use   Vaping status: Never Used  Substance and Sexual Activity   Alcohol use: No   Drug use: No   Sexual activity: Not on file  Other Topics Concern   Not on file  Social History Narrative   Marital status: widowed since 2002; not dating which is bad in 2018      Children:  3 children (45, 52, 50); 13 grandchildren; 1 gg      Employment: disability since 2009; retail      Lives: alone in [place] in apartment      Tobacco: quit in 2011      Alcohol: none      Drugs: none      Exercise: minimal in 2018      ADLs: independent with ADLs; drives      Seatbelt: 100%; no texting.   Social Drivers of Health   Financial Resource Strain: Medium Risk (06/28/2023)   Overall Financial Resource Strain (CARDIA)    Difficulty of Paying Living Expenses: Somewhat hard  Food Insecurity: No Food Insecurity (06/28/2023)   Hunger Vital Sign    Worried About Running Out of Food in the Last Year: Never true    Ran Out of Food in the Last Year: Never true  Transportation Needs: No Transportation Needs (06/28/2023)   PRAPARE - Administrator, Civil Service (Medical): No    Lack of Transportation (Non-Medical): No  Physical Activity: Inactive (06/28/2023)   Exercise Vital Sign    Days of Exercise per Week: 0 days    Minutes of Exercise per Session: 0 min  Stress: No Stress Concern Present (  06/28/2023)   [employer] [employer]ational [employer] - Occupational Stress Questionnaire    Feeling of Stress : Only a little  Social Connections: Unknown (06/28/2023)   Social Connection and Isolation Panel [NHANES]    Frequency of Communication with Friends and Family: Patient declined    Frequency of Social Gatherings with Friends and Family: Once a week    Attends Religious Services: More than 4 times per year    Active Member of [employer] or Organizations: Yes    Attends Banker Meetings: More than 4 times per year    Marital Status: Widowed   Past Surgical History:  Procedure Laterality Date   ABDOMINAL HYSTERECTOMY  1982   DUB; ovaries intact   bladder surg  1992   bladder retraction   BREAST BIOPSY     BREAST REDUCTION SURGERY  1984   CARDIAC CATHETERIZATION  1990's   [name]   COLON SURGERY  05/11/2011   [facility] of adhesions Crohn's   MASS EXCISION  Left 01/03/2018   Procedure: EXCISION LEFT UPPER BACK LIPOMA ERAS PATH;  Surgeon: Harriette Bouillon, MD;  Location: [facility];  Service: General;  Laterality: Left;   REDUCTION MAMMAPLASTY     SPINE SURGERY     5 total (1 upper, 4 lumbar)   SPINE SURGERY     nerve stimulator on right side   wrist and hand Right 07/2021   Dr. Annell Greening   Past Surgical History:  Procedure Laterality Date   ABDOMINAL HYSTERECTOMY  1982   DUB; ovaries intact   bladder surg  1992   bladder retraction   BREAST BIOPSY     BREAST REDUCTION SURGERY  1984   CARDIAC CATHETERIZATION  1990's   [name]   COLON SURGERY  05/11/2011   [facility] of adhesions Crohn's   MASS EXCISION Left 01/03/2018   Procedure: EXCISION LEFT UPPER BACK LIPOMA ERAS PATH;  Surgeon: Harriette Bouillon, MD;  Location: [facility];  Service: General;  Laterality: Left;   REDUCTION MAMMAPLASTY     SPINE SURGERY     5 total (1 upper, 4 lumbar)   SPINE SURGERY     nerve stimulator on right side   wrist and hand Right 07/2021   Dr. Annell Greening   Past Medical History:  Diagnosis Date   Anemia    Blood transfusion without reported diagnosis    Crohn's disease (HCC)    Disorder of sacroiliac joint    Exertional dyspnea 05/28/2021   Hyperlipidemia    Hypertension    Ischemic colitis (HCC)    Lumbago    Lumbar post-laminectomy syndrome    Lumbosacral neuritis    Lumbosacral radiculitis    OSA (obstructive sleep apnea) 05/28/2021   Sciatica    There were no vitals taken for this visit.  Opioid Risk Score:   Fall Risk Score:  `1  Depression screen [facility] 2/9     08/01/2023    8:43 AM 07/10/2023    2:56 PM 06/29/2023    9:04 AM 05/12/2023    8:25 AM 04/04/2023    9:39 AM 03/14/2023    8:32 AM 02/07/2023    8:29 AM  Depression screen PHQ 2/9  Decreased Interest 0 1 2 0 0 0 0  Down, Depressed, Hopeless 0 1 1 0 0 0 0  PHQ - 2 Score 0 2 3 0 0 0 0  Altered sleeping  1 1      Tired, decreased energy  1  1      Change in appetite  1 1      Feeling bad or failure about yourself   0 0      Trouble concentrating  0 0      Moving slowly or fidgety/restless  0 0      Suicidal thoughts  0 0      PHQ-9 Score  5 6        Review of Systems     Objective:   Physical Exam        Assessment & Plan:

## 2023-09-01 ENCOUNTER — Encounter: Admitting: Registered Nurse

## 2023-09-01 ENCOUNTER — Telehealth: Payer: Self-pay | Admitting: Registered Nurse

## 2023-09-01 ENCOUNTER — Other Ambulatory Visit: Payer: Self-pay

## 2023-09-01 ENCOUNTER — Other Ambulatory Visit: Payer: Self-pay | Admitting: Registered Nurse

## 2023-09-01 MED ORDER — MORPHINE SULFATE ER 15 MG PO TBCR
15.0000 mg | EXTENDED_RELEASE_TABLET | Freq: Three times a day (TID) | ORAL | 0 refills | Status: DC
  Filled 2023-09-01: qty 90, 30d supply, fill #0

## 2023-09-01 MED ORDER — MORPHINE SULFATE 15 MG PO TABS
15.0000 mg | ORAL_TABLET | Freq: Two times a day (BID) | ORAL | 0 refills | Status: DC | PRN
  Filled 2023-09-01: qty 60, 30d supply, fill #0

## 2023-09-01 NOTE — Telephone Encounter (Signed)
 Send refills to [facility] at [facility]

## 2023-09-01 NOTE — Telephone Encounter (Signed)
 Send refills for Morphine 15 MG 12 hr tablet and Morphine 15 MG tablet.

## 2023-09-01 NOTE — Telephone Encounter (Signed)
 PMP was Reviewed.  MS Contin and MSIR e-scribed to pharmacy.  Call placed to  Ms. Meunier regarding the above. She verbalizes understanding.

## 2023-09-04 ENCOUNTER — Other Ambulatory Visit: Payer: Self-pay | Admitting: Student

## 2023-09-04 ENCOUNTER — Other Ambulatory Visit (HOSPITAL_COMMUNITY): Payer: Self-pay

## 2023-09-04 ENCOUNTER — Telehealth: Payer: Self-pay | Admitting: Registered Nurse

## 2023-09-04 ENCOUNTER — Other Ambulatory Visit: Payer: Self-pay

## 2023-09-04 MED ORDER — LIDOCAINE 5 % EX PTCH
1.0000 | MEDICATED_PATCH | CUTANEOUS | 0 refills | Status: DC
  Filled 2023-09-04: qty 30, 30d supply, fill #0

## 2023-09-04 MED ORDER — MORPHINE SULFATE ER 15 MG PO TBCR: 15.0000 mg | EXTENDED_RELEASE_TABLET | Freq: Three times a day (TID) | ORAL | 0 refills | Status: DC

## 2023-09-04 NOTE — Telephone Encounter (Signed)
 PMP was Reviewed.  [facility] is out of stock of Morphine.  [facility] pharmacy was called, they hung up on this provider.  Call placed to Ms. Millar regarding the above. MS Contin prescription was sent to [facility], Ms. Tolsma verbalizes understanding. She was instructed to go to [facility], if they don't have the MS Contin in stock to call office in the morning. She verbalizes understanding.

## 2023-09-04 NOTE — Telephone Encounter (Signed)
 Patient called to report she needs morphine extended release. Her pharmacy is out. The pharmacy [facility] at [facility], [place] [phone] stated they need to talk to our office. They will not tell her if they have it or not.

## 2023-09-05 ENCOUNTER — Other Ambulatory Visit (HOSPITAL_COMMUNITY): Payer: Self-pay

## 2023-09-05 ENCOUNTER — Telehealth: Payer: Self-pay | Admitting: Registered Nurse

## 2023-09-05 ENCOUNTER — Other Ambulatory Visit: Payer: Self-pay

## 2023-09-05 MED ORDER — XTAMPZA ER 13.5 MG PO C12A
1.0000 | EXTENDED_RELEASE_CAPSULE | Freq: Two times a day (BID) | ORAL | 0 refills | Status: DC
  Filled 2023-09-05: qty 60, 30d supply, fill #0

## 2023-09-05 NOTE — Telephone Encounter (Signed)
 PMP was Reviewed.  Xtampza prescription sent to [facility].

## 2023-09-05 NOTE — Telephone Encounter (Signed)
 PMP was Reviewed,  Morphine on Back Order.  Drug Formulary Reviewed.  Xtampza e-scribed.  Call placed to Ms. Gravs regarding the above, she verbalizes understanding.

## 2023-09-15 ENCOUNTER — Other Ambulatory Visit: Payer: Self-pay

## 2023-09-15 ENCOUNTER — Other Ambulatory Visit: Payer: Self-pay | Admitting: Student

## 2023-09-15 DIAGNOSIS — E119 Type 2 diabetes mellitus without complications: Secondary | ICD-10-CM

## 2023-09-15 MED ORDER — LOSARTAN POTASSIUM 25 MG PO TABS
12.5000 mg | ORAL_TABLET | Freq: Every day | ORAL | 0 refills | Status: DC
  Filled 2023-09-15: qty 30, 60d supply, fill #0

## 2023-09-19 ENCOUNTER — Other Ambulatory Visit: Payer: Self-pay | Admitting: Student

## 2023-09-19 ENCOUNTER — Other Ambulatory Visit: Payer: Self-pay

## 2023-09-19 DIAGNOSIS — J301 Allergic rhinitis due to pollen: Secondary | ICD-10-CM

## 2023-09-19 MED ORDER — FLUTICASONE PROPIONATE 50 MCG/ACT NA SUSP
2.0000 | Freq: Every day | NASAL | 1 refills | Status: DC
  Filled 2023-09-19: qty 48, 90d supply, fill #0
  Filled 2023-12-28: qty 48, 90d supply, fill #1

## 2023-09-20 ENCOUNTER — Other Ambulatory Visit: Payer: Self-pay

## 2023-09-20 ENCOUNTER — Encounter: Payer: Self-pay | Admitting: Registered Nurse

## 2023-09-20 ENCOUNTER — Encounter: Attending: Physical Medicine & Rehabilitation | Admitting: Registered Nurse

## 2023-09-20 VITALS — BP 149/80 | HR 60 | Ht 64.0 in | Wt 220.0 lb

## 2023-09-20 DIAGNOSIS — M546 Pain in thoracic spine: Secondary | ICD-10-CM

## 2023-09-20 DIAGNOSIS — Z79891 Long term (current) use of opiate analgesic: Secondary | ICD-10-CM | POA: Diagnosis not present

## 2023-09-20 DIAGNOSIS — G894 Chronic pain syndrome: Secondary | ICD-10-CM

## 2023-09-20 DIAGNOSIS — G8929 Other chronic pain: Secondary | ICD-10-CM

## 2023-09-20 DIAGNOSIS — M4807 Spinal stenosis, lumbosacral region: Secondary | ICD-10-CM

## 2023-09-20 DIAGNOSIS — Z5181 Encounter for therapeutic drug level monitoring: Secondary | ICD-10-CM

## 2023-09-20 DIAGNOSIS — M545 Low back pain, unspecified: Secondary | ICD-10-CM | POA: Insufficient documentation

## 2023-09-20 MED ORDER — MORPHINE SULFATE ER 15 MG PO TBCR
15.0000 mg | EXTENDED_RELEASE_TABLET | Freq: Three times a day (TID) | ORAL | 0 refills | Status: DC
  Filled 2023-09-20 (×2): qty 90, 30d supply, fill #0

## 2023-09-20 MED ORDER — METHYLPREDNISOLONE 4 MG PO TBPK
ORAL_TABLET | ORAL | 0 refills | Status: AC
  Filled 2023-09-20: qty 21, 6d supply, fill #0

## 2023-09-20 MED ORDER — MORPHINE SULFATE 15 MG PO TABS
15.0000 mg | ORAL_TABLET | Freq: Two times a day (BID) | ORAL | 0 refills | Status: DC | PRN
  Filled 2023-09-20 – 2023-09-29 (×2): qty 60, 30d supply, fill #0

## 2023-09-20 NOTE — Progress Notes (Signed)
 Subjective:    Patient ID: [name] [name], female    DOB: [birth year], 68 y.o.   MRN: [id number]  HPI: .AYJAH SHOW is a 68 y.o. female who returns for follow up appointment for chronic pain and medication refill. She states her pain is located in her mid- back, she reports increase intensity of pain with the medication changed Xtampza. Also states Xtampza is ineffective. Her pharmacy was called and the have MS Contin in stock, prescrition sent to pharmacy. [name] [name] verbalizes understanding.  She rates her pain 10. Her current exercise regime is walking and performing stretching exercises.  [name] [name] Morphine equivalent is 75.00 MME.   Last UDS was Performed on 08/01/2023, it was consistent.      Pain Inventory Average Pain 10 Pain Right Now 10 My pain is constant, sharp, stabbing, and aching  In the last 24 hours, has pain interfered with the following? General activity 9 Relation with others 9 Enjoyment of life 9 What TIME of day is your pain at its worst? morning , daytime, evening, and night Sleep (in general) Poor  Pain is worse with: walking, bending, sitting, standing, and some activites Pain improves with:  nothing Relief from Meds:  .  Family History  Problem Relation Age of Onset   Hyperlipidemia Mother    Heart attack Mother    Heart disease Mother 65       CHF; CAD   Hypertension Mother    Heart failure Mother    Diabetes Sister    Lupus Sister    Kidney failure Sister    Hyperlipidemia Sister    Hypertension Sister    Stroke Maternal Aunt    Hypertension Brother    Colon cancer Neg Hx    Rectal cancer Neg Hx    Breast cancer Neg Hx    Social History   Socioeconomic History   Marital status: Widowed    Spouse name: Not on file   Number of children: 3   Years of education: Not on file   Highest education level: 9th grade  Occupational History   Occupation: disability    Comment: 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  Tobacco Use   Smoking status:  Former    Current packs/day: 0.00    Average packs/day: 0.5 packs/day for 20.0 years (10.0 ttl pk-yrs)    Types: Cigarettes    Start date: 07/21/1988    Quit date: 07/21/2008    Years since quitting: 15.1   Smokeless tobacco: Never  Vaping Use   Vaping status: Never Used  Substance and Sexual Activity   Alcohol use: No   Drug use: No   Sexual activity: Not on file  Other Topics Concern   Not on file  Social History Narrative   Marital status: widowed since 2002; not dating which is bad in 2018      Children: 3 children (45, 81, 85); 13 grandchildren; 1 gg      Employment: disability since 2009; retail      Lives: alone in [place] in apartment      Tobacco: quit in 2011      Alcohol: none      Drugs: none      Exercise: minimal in 2018      ADLs: independent with ADLs; drives      Seatbelt: 100%; no texting.   Social Drivers of Health   Financial Resource Strain: Medium Risk (06/28/2023)   Overall Financial Resource Strain (CARDIA)    Difficulty of Paying  Living Expenses: Somewhat hard  Food Insecurity: No Food Insecurity (06/28/2023)   Hunger Vital Sign    Worried About Running Out of Food in the Last Year: Never true    Ran Out of Food in the Last Year: Never true  Transportation Needs: No Transportation Needs (06/28/2023)   PRAPARE - Administrator, Civil Service (Medical): No    Lack of Transportation (Non-Medical): No  Physical Activity: Inactive (06/28/2023)   Exercise Vital Sign    Days of Exercise per Week: 0 days    Minutes of Exercise per Session: 0 min  Stress: No Stress Concern Present (06/28/2023)   [employer] of Occupational Health - Occupational Stress Questionnaire    Feeling of Stress : Only a little  Social Connections: Unknown (06/28/2023)   Social Connection and Isolation Panel [NHANES]    Frequency of Communication with Friends and Family: Patient declined    Frequency of Social Gatherings with Friends and Family: Once a week    Attends  Religious Services: More than 4 times per year    Active Member of [employer] or Organizations: Yes    Attends Banker Meetings: More than 4 times per year    Marital Status: Widowed   Past Surgical History:  Procedure Laterality Date   ABDOMINAL HYSTERECTOMY  1982   DUB; ovaries intact   bladder surg  1992   bladder retraction   BREAST BIOPSY     BREAST REDUCTION SURGERY  1984   CARDIAC CATHETERIZATION  1990's   [name]   COLON SURGERY  05/11/2011   [facility] of adhesions Crohn's   MASS EXCISION Left 01/03/2018   Procedure: EXCISION LEFT UPPER BACK LIPOMA ERAS PATH;  Surgeon: Harriette Bouillon, MD;  Location: [facility];  Service: General;  Laterality: Left;   REDUCTION MAMMAPLASTY     SPINE SURGERY     5 total (1 upper, 4 lumbar)   SPINE SURGERY     nerve stimulator on right side   wrist and hand Right 07/2021   Dr. Annell Greening   Past Surgical History:  Procedure Laterality Date   ABDOMINAL HYSTERECTOMY  1982   DUB; ovaries intact   bladder surg  1992   bladder retraction   BREAST BIOPSY     BREAST REDUCTION SURGERY  1984   CARDIAC CATHETERIZATION  1990's   [name]   COLON SURGERY  05/11/2011   [facility] of adhesions Crohn's   MASS EXCISION Left 01/03/2018   Procedure: EXCISION LEFT UPPER BACK LIPOMA ERAS PATH;  Surgeon: Harriette Bouillon, MD;  Location: [facility];  Service: General;  Laterality: Left;   REDUCTION MAMMAPLASTY     SPINE SURGERY     5 total (1 upper, 4 lumbar)   SPINE SURGERY     nerve stimulator on right side   wrist and hand Right 07/2021   Dr. Annell Greening   Past Medical History:  Diagnosis Date   Anemia    Blood transfusion without reported diagnosis    Crohn's disease (HCC)    Disorder of sacroiliac joint    Exertional dyspnea 05/28/2021   Hyperlipidemia    Hypertension    Ischemic colitis (HCC)    Lumbago    Lumbar post-laminectomy syndrome    Lumbosacral neuritis    Lumbosacral radiculitis     OSA (obstructive sleep apnea) 05/28/2021   Sciatica    There were no vitals taken for this visit.  Opioid Risk Score:  Fall Risk Score:  `1  Depression screen [facility] 2/9     08/01/2023    8:43 AM 07/10/2023    2:56 PM 06/29/2023    9:04 AM 05/12/2023    8:25 AM 04/04/2023    9:39 AM 03/14/2023    8:32 AM 02/07/2023    8:29 AM  Depression screen PHQ 2/9  Decreased Interest 0 1 2 0 0 0 0  Down, Depressed, Hopeless 0 1 1 0 0 0 0  PHQ - 2 Score 0 2 3 0 0 0 0  Altered sleeping  1 1      Tired, decreased energy  1 1      Change in appetite  1 1      Feeling bad or failure about yourself   0 0      Trouble concentrating  0 0      Moving slowly or fidgety/restless  0 0      Suicidal thoughts  0 0      PHQ-9 Score  5 6        Review of Systems  Musculoskeletal:  Positive for back pain.  All other systems reviewed and are negative.     Objective:   Physical Exam Vitals and nursing note reviewed.  Constitutional:      Appearance: Normal appearance.  Cardiovascular:     Rate and Rhythm: Bradycardia present.     Pulses: Normal pulses.     Heart sounds: Normal heart sounds.  Pulmonary:     Effort: Pulmonary effort is normal.     Breath sounds: Normal breath sounds.  Musculoskeletal:     Comments: Normal Muscle Bulk and Muscle Testing Reveals:  Upper Extremities: Full ROM and Muscle Strength 5/5  Thoracic Paraspinal Tenderness: T-7-T-10  Lumbar Hypersensitivity Lower Extremities: Full ROM and Muscle Strength 5/5  Arises from Table slowly using cane for support Antalgic Gait     Skin:    General: Skin is warm and dry.  Neurological:     Mental Status: She is alert and oriented to person, place, and time.  Psychiatric:        Mood and Affect: Mood normal.        Behavior: Behavior normal.         Assessment & Plan:  Acute Exacerbation of Chronic Low Back Pain: RX: Medrol Dose Pak.  2. Lumbar Postlaminectomy/ Spinal Stenosis Lumbar Region/ Post-Op Pain/Low back pain  with radiating symptoms in L3-4 distribution:  continues to be limited by pain. Xtampza discontinued ineffective. Refilled:  MSIR 15 mg one tablet twice a day as needed for moderate to sever pain #60 and  Morphine Sulfate ER 15 mg Q 8 hours. 09/20/2023. We will continue the opioid monitoring program, this consists of regular clinic visits, examinations, urine drug screen, pill counts as well as use of [place] Controlled Substance Reporting system. A 12 month History has been reviewed on the [place] Controlled Substance Reporting System on 09/20/2023. Encouraged to Continue exercise regime. 3.. Lumbar Radiculopathy: No complaints today. Continue Topamax:.Continue to Monitor 09/20/2023 4.Chronic  Right Knee Pain: No complaints today.  Continue with Ice Therapy and  Voltaren Gel. 09/20/2023 5. Muscle Spasm: Continue current treatment: Tizanidine. 09/20/2023  6. Lipoma: S/P Left  Lipoma Excision of upper back  By Dr. Luisa Hart on 01/03/2018. Surgery Following. [name] [name] was instructed to scheduled an appointment with Dr Luisa Hart, to evaluate Lipoma, she verbalizes understanding. Continue to Monitor.09/20/2023.   7. Paresthesia of Skin: Continue Topamax. Continue to  monitor. 09/20/2023 8. Adhesive Capsulitis of Right Shoulder:Right Shoulder Tendonitis Pain: . S/P Right Shoulder Cortisone Injection with Dr Wynn Banker,  no relief noted. Continue to monitor. Continue HEP as Tolerated. 09/20/2023  9. Polyarthralgia: Continue HEP as Tolerated. Continue to Monitor.09/20/2023  10 Sacral Pain: No complaints today. Continue to Monitor. 09/20/2023 1l. Right Hand Pain: S/P :Post right middle finger trigger finger release carpal tunnel release. Dr Ophelia Charter Following.  Continue to monitor. 09/20/2023  12. Left hand Pain Left Index Trigger Finger: Dr Ophelia Charter following. 09/20/2023 13. Bradycardia: Apical Pulse Checked: Cardiology following.  F/U in 1 month

## 2023-09-21 ENCOUNTER — Other Ambulatory Visit: Payer: Self-pay | Admitting: Student

## 2023-09-21 ENCOUNTER — Other Ambulatory Visit: Payer: Self-pay

## 2023-09-21 DIAGNOSIS — E119 Type 2 diabetes mellitus without complications: Secondary | ICD-10-CM

## 2023-09-21 MED ORDER — OZEMPIC (1 MG/DOSE) 4 MG/3ML ~~LOC~~ SOPN
1.0000 mg | PEN_INJECTOR | SUBCUTANEOUS | 0 refills | Status: DC
  Filled 2023-09-21: qty 3, 28d supply, fill #0

## 2023-09-29 ENCOUNTER — Other Ambulatory Visit: Payer: Self-pay

## 2023-10-01 ENCOUNTER — Other Ambulatory Visit: Payer: Self-pay | Admitting: Student

## 2023-10-01 DIAGNOSIS — F320 Major depressive disorder, single episode, mild: Secondary | ICD-10-CM

## 2023-10-02 ENCOUNTER — Other Ambulatory Visit: Payer: Self-pay

## 2023-10-02 ENCOUNTER — Other Ambulatory Visit: Payer: Self-pay | Admitting: Student

## 2023-10-02 DIAGNOSIS — F320 Major depressive disorder, single episode, mild: Secondary | ICD-10-CM

## 2023-10-02 MED FILL — Escitalopram Oxalate Tab 5 MG (Base Equiv): ORAL | 60 days supply | Qty: 60 | Fill #0 | Status: AC

## 2023-10-03 ENCOUNTER — Other Ambulatory Visit: Payer: Self-pay | Admitting: Student

## 2023-10-03 ENCOUNTER — Other Ambulatory Visit: Payer: Self-pay

## 2023-10-03 MED ORDER — LIDOCAINE 5 % EX PTCH
1.0000 | MEDICATED_PATCH | CUTANEOUS | 0 refills | Status: DC
  Filled 2023-10-03: qty 30, 30d supply, fill #0

## 2023-10-17 ENCOUNTER — Encounter (HOSPITAL_BASED_OUTPATIENT_CLINIC_OR_DEPARTMENT_OTHER): Admitting: Registered Nurse

## 2023-10-17 ENCOUNTER — Other Ambulatory Visit: Payer: Self-pay

## 2023-10-17 ENCOUNTER — Encounter: Payer: Self-pay | Admitting: Registered Nurse

## 2023-10-17 VITALS — BP 131/77 | HR 66 | Ht 64.0 in | Wt 218.0 lb

## 2023-10-17 DIAGNOSIS — G894 Chronic pain syndrome: Secondary | ICD-10-CM

## 2023-10-17 DIAGNOSIS — Z79891 Long term (current) use of opiate analgesic: Secondary | ICD-10-CM

## 2023-10-17 DIAGNOSIS — M546 Pain in thoracic spine: Secondary | ICD-10-CM | POA: Diagnosis not present

## 2023-10-17 DIAGNOSIS — Z5181 Encounter for therapeutic drug level monitoring: Secondary | ICD-10-CM | POA: Diagnosis not present

## 2023-10-17 DIAGNOSIS — G8929 Other chronic pain: Secondary | ICD-10-CM | POA: Diagnosis not present

## 2023-10-17 DIAGNOSIS — M4807 Spinal stenosis, lumbosacral region: Secondary | ICD-10-CM | POA: Diagnosis not present

## 2023-10-17 DIAGNOSIS — M545 Low back pain, unspecified: Secondary | ICD-10-CM | POA: Diagnosis not present

## 2023-10-17 MED ORDER — MORPHINE SULFATE ER 15 MG PO TBCR
15.0000 mg | EXTENDED_RELEASE_TABLET | Freq: Three times a day (TID) | ORAL | 0 refills | Status: DC
  Filled 2023-10-17 (×2): qty 90, 30d supply, fill #0

## 2023-10-17 MED ORDER — MORPHINE SULFATE 15 MG PO TABS
15.0000 mg | ORAL_TABLET | Freq: Two times a day (BID) | ORAL | 0 refills | Status: DC | PRN
  Filled 2023-10-17 – 2023-10-25 (×2): qty 60, 30d supply, fill #0

## 2023-10-17 NOTE — Progress Notes (Signed)
 Subjective:    Patient ID: [name] [name], female    DOB: 04/22/1956, 68 y.o.   MRN: [id number]  HPI: [name] [name] is a 68 y.o. female who returns for follow up appointment for chronic pain and medication refill. She states her pain is located in her lower back. She rates her pain 9. Her current exercise regime is walking and performing stretching exercises.  [name] [name] Morphine  equivalent is 75.00 MME.   Last UDS was Performed on 08/01/2023/ it was consistent.     Pain Inventory Average Pain 9 Pain Right Now 9 My pain is constant, sharp, tingling, and aching  In the last 24 hours, has pain interfered with the following? General activity 9 Relation with others 9 Enjoyment of life 9 What TIME of day is your pain at its worst? morning , daytime, evening, and night Sleep (in general) Poor  Pain is worse with: walking, inactivity, and standing Pain improves with: rest, heat/ice, and medication Relief from Meds: 1  Family History  Problem Relation Age of Onset   Hyperlipidemia Mother    Heart attack Mother    Heart disease Mother 58       CHF; CAD   Hypertension Mother    Heart failure Mother    Diabetes Sister    Lupus Sister    Kidney failure Sister    Hyperlipidemia Sister    Hypertension Sister    Stroke Maternal Aunt    Hypertension Brother    Colon cancer Neg Hx    Rectal cancer Neg Hx    Breast cancer Neg Hx    Social History   Socioeconomic History   Marital status: Widowed    Spouse name: Not on file   Number of children: 3   Years of education: Not on file   Highest education level: 9th grade  Occupational History   Occupation: disability    Comment: 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  Tobacco Use   Smoking status: Former    Current packs/day: 0.00    Average packs/day: 0.5 packs/day for 20.0 years (10.0 ttl pk-yrs)    Types: Cigarettes    Start date: 07/21/1988    Quit date: 07/21/2008    Years since quitting: 15.2   Smokeless tobacco: Never  Vaping Use    Vaping status: Never Used  Substance and Sexual Activity   Alcohol  use: No   Drug use: No   Sexual activity: Not on file  Other Topics Concern   Not on file  Social History Narrative   Marital status: widowed since 2002; not dating which is bad in 2018      Children: 3 children (45, 56, 40); 13 grandchildren; 1 gg      Employment: disability since 2009; retail      Lives: alone in [place] in apartment      Tobacco: quit in 2011      Alcohol : none      Drugs: none      Exercise: minimal in 2018      ADLs: independent with ADLs; drives      Seatbelt: 100%; no texting.   Social Drivers of Health   Financial Resource Strain: Medium Risk (06/28/2023)   Overall Financial Resource Strain (CARDIA)    Difficulty of Paying Living Expenses: Somewhat hard  Food Insecurity: No Food Insecurity (06/28/2023)   Hunger Vital Sign    Worried About Running Out of Food in the Last Year: Never true    Ran Out of Food in  the Last Year: Never true  Transportation Needs: No Transportation Needs (06/28/2023)   PRAPARE - Administrator, Civil Service (Medical): No    Lack of Transportation (Non-Medical): No  Physical Activity: Inactive (06/28/2023)   Exercise Vital Sign    Days of Exercise per Week: 0 days    Minutes of Exercise per Session: 0 min  Stress: No Stress Concern Present (06/28/2023)   [employer] of Occupational Health - Occupational Stress Questionnaire    Feeling of Stress : Only a little  Social Connections: Unknown (06/28/2023)   Social Connection and Isolation Panel [NHANES]    Frequency of Communication with Friends and Family: Patient declined    Frequency of Social Gatherings with Friends and Family: Once a week    Attends Religious Services: More than 4 times per year    Active Member of [employer] or Organizations: Yes    Attends Banker Meetings: More than 4 times per year    Marital Status: Widowed   Past Surgical History:  Procedure Laterality Date    ABDOMINAL HYSTERECTOMY  1982   DUB; ovaries intact   bladder surg  1992   bladder retraction   BREAST BIOPSY     BREAST REDUCTION SURGERY  1984   CARDIAC CATHETERIZATION  1990's   [facility]   COLON SURGERY  05/11/2011   [facility] of adhesions Crohn's   MASS EXCISION Left 01/03/2018   Procedure: EXCISION LEFT UPPER BACK LIPOMA ERAS PATH;  Surgeon: Sim Dryer, MD;  Location: [facility];  Service: General;  Laterality: Left;   REDUCTION MAMMAPLASTY     SPINE SURGERY     5 total (1 upper, 4 lumbar)   SPINE SURGERY     nerve stimulator on right side   wrist and hand Right 07/2021   Dr. Tommy Frames   Past Surgical History:  Procedure Laterality Date   ABDOMINAL HYSTERECTOMY  1982   DUB; ovaries intact   bladder surg  1992   bladder retraction   BREAST BIOPSY     BREAST REDUCTION SURGERY  1984   CARDIAC CATHETERIZATION  1990's   [facility]   COLON SURGERY  05/11/2011   [facility] of adhesions Crohn's   MASS EXCISION Left 01/03/2018   Procedure: EXCISION LEFT UPPER BACK LIPOMA ERAS PATH;  Surgeon: Sim Dryer, MD;  Location: [facility];  Service: General;  Laterality: Left;   REDUCTION MAMMAPLASTY     SPINE SURGERY     5 total (1 upper, 4 lumbar)   SPINE SURGERY     nerve stimulator on right side   wrist and hand Right 07/2021   Dr. Tommy Frames   Past Medical History:  Diagnosis Date   Anemia    Blood transfusion without reported diagnosis    Crohn's disease (HCC)    Disorder of sacroiliac joint    Exertional dyspnea 05/28/2021   Hyperlipidemia    Hypertension    Ischemic colitis (HCC)    Lumbago    Lumbar post-laminectomy syndrome    Lumbosacral neuritis    Lumbosacral radiculitis    OSA (obstructive sleep apnea) 05/28/2021   Sciatica    BP 131/77   Pulse 66   Ht 5\' 4"  (1.626 m)   Wt 218 lb (98.9 kg)   SpO2 95%   BMI 37.42 kg/m   Opioid Risk Score:   Fall Risk Score:  `1  Depression screen [facility] 2/9     10/17/2023  8:22 AM 08/01/2023    8:43 AM 07/10/2023    2:56 PM 06/29/2023    9:04 AM 05/12/2023    8:25 AM 04/04/2023    9:39 AM 03/14/2023    8:32 AM  Depression screen PHQ 2/9  Decreased Interest 0 0 1 2 0 0 0  Down, Depressed, Hopeless 0 0 1 1 0 0 0  PHQ - 2 Score 0 0 2 3 0 0 0  Altered sleeping   1 1     Tired, decreased energy   1 1     Change in appetite   1 1     Feeling bad or failure about yourself    0 0     Trouble concentrating   0 0     Moving slowly or fidgety/restless   0 0     Suicidal thoughts   0 0     PHQ-9 Score   5 6        Review of Systems  Musculoskeletal:  Positive for back pain.       Right shoulder   All other systems reviewed and are negative.      Objective:   Physical Exam Vitals and nursing note reviewed.  Constitutional:      Appearance: Normal appearance.  Cardiovascular:     Rate and Rhythm: Normal rate and regular rhythm.     Pulses: Normal pulses.     Heart sounds: Normal heart sounds.  Pulmonary:     Effort: Pulmonary effort is normal.     Breath sounds: Normal breath sounds.  Musculoskeletal:     Comments: Normal Muscle Bulk and Muscle Testing Reveals:  Upper Extremities: Full ROM and Muscle Strength 5/5  Lumbar Paraspinal Tenderness: L-3-L-5 Lower Extremities: Full ROM and Muscle Strength 5/5 Arises from Table slowly Narrow Based Gait     Skin:    General: Skin is warm and dry.  Neurological:     Mental Status: She is alert and oriented to person, place, and time.  Psychiatric:        Mood and Affect: Mood normal.        Behavior: Behavior normal.         Assessment & Plan:  1.Lumbar Postlaminectomy/ Spinal Stenosis Lumbar Region/ Post-Op Pain/Low back pain with radiating symptoms in L3-4 distribution:  continues to be limited by pain. Refilled:  MSIR 15 mg one tablet twice a day as needed for moderate to sever pain #60 and  Morphine  Sulfate ER 15 mg Q 8 hours. 10/17/2023. We will continue the opioid monitoring program, this  consists of regular clinic visits, examinations, urine drug screen, pill counts as well as use of [place]  Controlled Substance Reporting system. A 12 month History has been reviewed on the [place]  Controlled Substance Reporting System on 10/17/2023. Encouraged to Continue exercise regime.   2. Lumbar Radiculopathy: No complaints today. Continue Topamax :.Continue to Monitor 10/17/2023 3.Chronic  Right Knee Pain: No complaints today.  Continue with Ice Therapy and  Voltaren  Gel. 10/17/2023 4. Muscle Spasm: Continue current treatment: Tizanidine . 10/17/2023  5. Lipoma: S/P Left  Lipoma Excision of upper back  By Dr. Afton Horse on 01/03/2018. Surgery Following. [name] [name] was instructed to scheduled an appointment with Dr Afton Horse, to evaluate Lipoma, she verbalizes understanding. Continue to Monitor.10/17/2023.   6. Paresthesia of Skin: Continue Topamax . Continue to monitor. 10/17/2023 7. Adhesive Capsulitis of Right Shoulder:Right Shoulder Tendonitis Pain: . S/P Right Shoulder Cortisone Injection with Dr Sharl Davies,  no relief noted. Continue  to monitor. Continue HEP as Tolerated. 10/17/2023  8. Polyarthralgia: Continue HEP as Tolerated. Continue to Monitor.10/17/2023  9. Sacral Pain: No complaints today. Continue to Monitor. 10/17/2023 10. Right Hand Pain: No complaints today. S/P :Post right middle finger trigger finger release carpal tunnel release. Dr Murrel Arnt Following.  Continue to monitor. 10/17/2023  11. Left hand Pain : No complaints today. Left Index Trigger Finger: Dr Murrel Arnt following. 10/17/2023   F/U in 1 month

## 2023-10-18 ENCOUNTER — Other Ambulatory Visit: Payer: Self-pay

## 2023-10-18 ENCOUNTER — Other Ambulatory Visit: Payer: Self-pay | Admitting: Student

## 2023-10-18 DIAGNOSIS — E119 Type 2 diabetes mellitus without complications: Secondary | ICD-10-CM

## 2023-10-19 ENCOUNTER — Telehealth: Payer: Self-pay | Admitting: Registered Nurse

## 2023-10-19 ENCOUNTER — Other Ambulatory Visit: Payer: Self-pay

## 2023-10-19 MED ORDER — OZEMPIC (1 MG/DOSE) 4 MG/3ML ~~LOC~~ SOPN
1.0000 mg | PEN_INJECTOR | SUBCUTANEOUS | 0 refills | Status: DC
  Filled 2023-10-19: qty 3, 28d supply, fill #0

## 2023-10-19 NOTE — Telephone Encounter (Signed)
 Pt called and stated [facility] in [place] doesn't have the morphine  like she was told. She was wanting to know if eunice could find somewhere else that has it in stock. She said she runs out of meds tomorrow

## 2023-10-20 ENCOUNTER — Telehealth: Payer: Self-pay | Admitting: Registered Nurse

## 2023-10-20 MED ORDER — MORPHINE SULFATE ER 15 MG PO TBCR: 15.0000 mg | EXTENDED_RELEASE_TABLET | Freq: Three times a day (TID) | ORAL | 0 refills | Status: DC

## 2023-10-20 NOTE — Telephone Encounter (Signed)
 Please send this prescription to [facility], phone number [phone].

## 2023-10-20 NOTE — Telephone Encounter (Signed)
 PMP was Reviewed.  [facility] out of stock . MS Contin  MS Contin  E-scribed to pharmacy: [facility]  Call placed to Ms. Gervase, regarding the above. She verbalizes understanding.

## 2023-10-25 ENCOUNTER — Encounter (INDEPENDENT_AMBULATORY_CARE_PROVIDER_SITE_OTHER): Payer: Self-pay

## 2023-10-25 ENCOUNTER — Ambulatory Visit (INDEPENDENT_AMBULATORY_CARE_PROVIDER_SITE_OTHER): Payer: 59 | Admitting: Cardiovascular Disease

## 2023-10-25 ENCOUNTER — Encounter (HOSPITAL_BASED_OUTPATIENT_CLINIC_OR_DEPARTMENT_OTHER): Payer: Self-pay | Admitting: Cardiovascular Disease

## 2023-10-25 VITALS — BP 128/72 | Ht 64.0 in | Wt 218.5 lb

## 2023-10-25 DIAGNOSIS — R002 Palpitations: Secondary | ICD-10-CM | POA: Diagnosis not present

## 2023-10-25 DIAGNOSIS — E7800 Pure hypercholesterolemia, unspecified: Secondary | ICD-10-CM | POA: Diagnosis not present

## 2023-10-25 DIAGNOSIS — G4733 Obstructive sleep apnea (adult) (pediatric): Secondary | ICD-10-CM

## 2023-10-25 DIAGNOSIS — I10 Essential (primary) hypertension: Secondary | ICD-10-CM

## 2023-10-25 DIAGNOSIS — R0789 Other chest pain: Secondary | ICD-10-CM

## 2023-10-25 NOTE — Progress Notes (Signed)
 Cardiology Office Note:  .   Date:  10/25/2023  ID:  [name] [name], DOB [birth year], MRN [id number] PCP: Genora Kidd, MD  [facility] Providers Cardiologist:  Maudine Sos, MD    History of Present Illness: .    [name] [name] is a 68 y.o. female with a hx of minimal CAD, anemia, Crohn's disease, hyperlipidemia, hypertension, and sciatica here for follow-up. She presented 01/01/2021 for the initial evaluation of heart palpitations. She saw her PCP on 12/24/2020 and it was noted her heart rate was in the 50s and 60s  She reported getting dizzy after getting out of the shower. They did an EKG which showed sinus bradycardia, and inferolateral T wave inversions. An Echo was ordered but has not been performed. Previous Echo 10/2019 revealed LVEF 55-60% with grade 1 diastolic dysfunction and moderate LVEH. She had a nuclear stress test 05/2016 that was negative for ischemia. Ln 02/2021 she had a spinal cord stimulator implanted.    She reported palpitations and wore a 3-day Zio which showed an average heart rate of 67 bpm with a minimum of 42 bpm. She had five beats of SVT and occasional PVCs. She followed up with Neomi Banks, NP on 01/2021.  It was noted that her lowest heart rates occurred while sleeping, and she was referred for a sleep study. She had an Echo 12/2020 with LVEF 55-60%, mild to moderate LVH and grade 1 diastolic dysfunction. She had a sleep study 03/2021. She did have some hypoxic events, but woke up at 1 AM and was unable to fall back asleep.   [name] [name] was started on metoprolol  for palpitations.  She was referred for a nuclear stress test 05/2021 that was low risk.  She had some intermittent vertigo that was treated with Epley maneuvers.  At her visit 03/2022 she reported exertional dyspnea and atypical chest pain. Coronary CT-A 04/2022 revealed minimal plaque in the LAD and RCA and aortic atherosclerosis.  Calcium  score was 157, 89th percentile.  She was started on  aspirin  and atorvastatin .   Discussed the use of AI scribe software for clinical note transcription with the patient, who gave verbal consent to proceed. History of Present Illness [name] [name] notes that her heart rate, which had previously dropped to the thirties, has improved and is now stable. The bradycardia episodes occurred every month or two, with the lowest recorded heart rate being in the thirties. This issue was first noted in August of the previous year. She experiences no significant symptoms such as lightheadedness or dizziness, except when tilting her head back or lying on her left side, which causes a queasy, nauseous feeling. She is currently on metoprolol , taking half a tablet as prescribed.  She has a history of back surgery three years ago, involving the removal of discs and insertion of screws. This has limited her ability to engage in physical activities such as water aerobics, exercise biking, and treadmill use due to pain and discomfort. She experiences significant pain when ascending stairs, affecting her daily activities and contributing to weight gain.  Her diet consists mainly of applesauce, oatmeal, and dinner meals, but she struggles with weight management despite being on Ozempic  for over a year, starting at 0.25 mg and increasing to 1 mg. She reports tightness and swelling in her fingers and hands. She avoids snacks like chips and cookies due to cost and lack of interest.  She lives with her sister and has difficulty with stairs, which limits her access to the  kitchen. She keeps a cooler with essentials like water and applesauce upstairs to minimize trips downstairs.  She is on atorvastatin , chlorthalidone , losartan , metoprolol , and a baby aspirin  for her cardiovascular health. She has a history of minimal heart artery blockage.  She reports persistent pain on the right side of her head, extending from her neck to her head, which she attributes to previous neck hardware.  This pain is exacerbated by certain activities, such as attending church services.  ROS:  As per HPI  Studies Reviewed: Aaron Aas   EKG Interpretation Date/Time:  Wednesday Oct 25 2023 09:50:53 EDT Ventricular Rate:  63 PR Interval:  128 QRS Duration:  82 QT Interval:  430 QTC Calculation: 440 R Axis:   33  Text Interpretation: Sinus rhythm Normal ECG No significant change since last tracing Confirmed by Maudine Sos ([id number]) on 10/25/2023 9:53:19 AM   Coronary CT-A 04/2022:    IMPRESSION: 1. Coronary calcium  score of 157. This was 64 percentile for age-, sex, and race-matched controls.   2. Normal coronary origin with right dominance.   3. Minimal (0-24) plaque in the LAD and RCA.   4. Aortic atherosclerosis.  Risk Assessment/Calculations:         STOP-Bang Score:         Physical Exam:   VS:  BP 128/72 (BP Location: Right Arm, Patient Position: Supine, Cuff Size: Large)   Ht 5\' 4"  (1.626 m)   Wt 218 lb 8 oz (99.1 kg)   SpO2 93%   BMI 37.51 kg/m  , BMI Body mass index is 37.51 kg/m. GENERAL:  Well appearing HEENT: Pupils equal round and reactive, fundi not visualized, oral mucosa unremarkable NECK:  No jugular venous distention, waveform within normal limits, carotid upstroke brisk and symmetric, no bruits, no thyromegaly LUNGS:  Clear to auscultation bilaterally HEART:  RRR.  PMI not displaced or sustained,S1 and S2 within normal limits, no S3, no S4, no clicks, no rubs, no murmurs ABD:  Flat, positive bowel sounds normal in frequency in pitch, no bruits, no rebound, no guarding, no midline pulsatile mass, no hepatomegaly, no splenomegaly EXT:  2 plus pulses throughout, no edema, no cyanosis no clubbing SKIN:  No rashes no nodules NEURO:  Cranial nerves II through XII grossly intact, motor grossly intact throughout PSYCH:  Cognitively intact, oriented to person place and time   ASSESSMENT AND PLAN: .    Assessment & Plan # Minimal coronary artery blockage #  Hyperlipidemia:  Minimal blockage on CT scan. No new symptoms. EKG normal. - Continue baby aspirin  for coronary artery blockage prevention. - Order lipid panel and comprehensive metabolic panel today.  # Bradycardia Heart rate stable.  She tolerates low dose metoprolol .  Pacemaker not indicated.  # Hypertension Blood pressure well-controlled on chlorthalidone , losartan , and metoprolol .  # Hyperlipidemia Cholesterol management with atorvastatin . Due for cholesterol check. - Order lipid panel today.  # Obesity Difficulty losing weight due to back pain. On Ozempic . Discussed dietary modifications. Limited appetite may affect weight loss. - Refer to [facility] program for dietary guidance and metabolic assessment.  # Back pain with history of surgery Chronic pain with limited activity. Potential need for hardware evaluation if pain persists. - Consider referral to spine specialist for evaluation of hardware stability if pain persists.  # History of vertigo No recent vertigo episodes. Occasional lightheadedness not consistent with vertigo.  Follow-up Plan to follow up in one year unless issues arise sooner. - Schedule follow-up appointment in one year.  Signed, Maudine Sos, MD

## 2023-10-25 NOTE — Patient Instructions (Signed)
 Medication Instructions:  Your physician recommends that you continue on your current medications as directed. Please refer to the Current Medication list given to you today.   Lab Work: Lipid Panel & CMP today   Follow-Up:  Please follow up in 1 year  with Dr. Theodis Fiscal, Slater Duncan, NP or Neomi Banks, NP   Other Instructions We have referred you over to [facility]. They will call you to get you scheduled.

## 2023-10-26 ENCOUNTER — Other Ambulatory Visit: Payer: Self-pay

## 2023-10-26 ENCOUNTER — Ambulatory Visit: Payer: 59

## 2023-10-26 VITALS — Ht 64.0 in | Wt 218.0 lb

## 2023-10-26 DIAGNOSIS — Z0000 Encounter for general adult medical examination without abnormal findings: Secondary | ICD-10-CM

## 2023-10-26 LAB — LIPID PANEL
Chol/HDL Ratio: 3.7 ratio (ref 0.0–4.4)
Cholesterol, Total: 147 mg/dL (ref 100–199)
HDL: 40 mg/dL (ref >39)
LDL Chol Calc (NIH): 75 mg/dL (ref 0–99)
Triglycerides: 187 mg/dL — ABNORMAL HIGH (ref 0–149)
VLDL Cholesterol Cal: 32 mg/dL (ref 5–40)

## 2023-10-26 LAB — COMPREHENSIVE METABOLIC PANEL WITH GFR
ALT: 18 IU/L (ref 0–32)
AST: 24 IU/L (ref 0–40)
Albumin: 4.5 g/dL (ref 3.9–4.9)
Alkaline Phosphatase: 65 IU/L (ref 44–121)
BUN/Creatinine Ratio: 13 (ref 12–28)
BUN: 13 mg/dL (ref 8–27)
Bilirubin Total: 1.1 mg/dL (ref 0.0–1.2)
CO2: 27 mmol/L (ref 20–29)
Calcium: 9.8 mg/dL (ref 8.7–10.3)
Chloride: 99 mmol/L (ref 96–106)
Creatinine, Ser: 1 mg/dL (ref 0.57–1.00)
Globulin, Total: 2.6 g/dL (ref 1.5–4.5)
Glucose: 102 mg/dL — ABNORMAL HIGH (ref 70–99)
Potassium: 3.4 mmol/L — ABNORMAL LOW (ref 3.5–5.2)
Sodium: 140 mmol/L (ref 134–144)
Total Protein: 7.1 g/dL (ref 6.0–8.5)
eGFR: 62 mL/min/{1.73_m2} (ref >59)

## 2023-10-26 NOTE — Progress Notes (Signed)
 Because this visit was a virtual/telehealth visit,  certain criteria was not obtained, such a blood pressure, CBG if applicable, and timed get up and go. Any medications not marked as "taking" were not mentioned during the medication reconciliation part of the visit. Any vitals not documented were not able to be obtained due to this being a telehealth visit or patient was unable to self-report a recent blood pressure reading due to a lack of equipment at home via telehealth. Vitals that have been documented are verbally provided by the patient.   Subjective:   [name] [name] is a 68 y.o. who presents for a Medicare Wellness preventive visit.  Visit Complete: Virtual I connected with  [name] [name] on 10/26/23 by a audio enabled telemedicine application and verified that I am speaking with the correct person using two identifiers.  Patient Location: Home  Provider Location: Office/Clinic  I discussed the limitations of evaluation and management by telemedicine. The patient expressed understanding and agreed to proceed.  Vital Signs: Because this visit was a virtual/telehealth visit, some criteria may be missing or patient reported. Any vitals not documented were not able to be obtained and vitals that have been documented are patient reported.  VideoDeclined- This patient declined Librarian, academic. Therefore the visit was completed with audio only.  Persons Participating in Visit: Patient.  AWV Questionnaire: Yes: Patient Medicare AWV questionnaire was completed by the patient on 10/22/2023; I have confirmed that all information answered by patient is correct and no changes since this date.  Cardiac Risk Factors include: advanced age (>30men, >28 women);diabetes mellitus;dyslipidemia;family history of premature cardiovascular disease;hypertension;obesity (BMI >30kg/m2);sedentary lifestyle     Objective:    Today's Vitals   10/26/23 1154 10/26/23 1155   Weight: 218 lb (98.9 kg)   Height: 5\' 4"  (1.626 m)   PainSc: 9  9   PainLoc: Back    Body mass index is 37.42 kg/m.     10/26/2023   11:58 AM 12/12/2022    2:44 PM 11/10/2022    2:38 PM 10/27/2022    1:29 PM 09/27/2022    8:23 AM 07/28/2022    8:33 AM 01/28/2022    8:32 AM  Advanced Directives  Does Patient Have a Medical Advance Directive? No No No No No No No  Would patient like information on creating a medical advance directive? Yes (MAU/Ambulatory/Procedural Areas - Information given) No - Patient declined No - Patient declined No - Patient declined No - Patient declined  No - Patient declined    Current Medications (verified) Outpatient Encounter Medications as of 10/26/2023  Medication Sig   aspirin  EC 81 MG tablet Take 1 tablet (81 mg total) by mouth daily. Swallow whole.   atorvastatin  (LIPITOR ) 80 MG tablet Take 1 tablet (80 mg total) by mouth daily.   chlorthalidone  (HYGROTON ) 25 MG tablet Take 1 tablet (25 mg total) by mouth daily.   diclofenac  Sodium (VOLTAREN ) 1 % GEL Apply 1 Application topically 4 (four) times daily as needed.   escitalopram  (LEXAPRO ) 5 MG tablet Take 1 tablet (5 mg total) by mouth daily.   fluticasone  (FLONASE ) 50 MCG/ACT nasal spray Place 2 sprays into both nostrils daily.   lidocaine  (LIDODERM ) 5 % Place 1 patch onto the skin daily. Remove and discard patch within 12 hours or as directed by doctor.   losartan  (COZAAR ) 25 MG tablet Take 0.5 tablets (12.5 mg total) by mouth at bedtime.   metoprolol  succinate (TOPROL -XL) 25 MG 24 hr tablet  Take 0.5 tablets (12.5 mg total) by mouth daily. Final attempt. Pt needs yearly appt for any future refills. Please call office at [phone] to schedule appt.   morphine  (MS CONTIN ) 15 MG 12 hr tablet Take 1 tablet (15 mg total) by mouth every 8 (eight) hours.   morphine  (MSIR) 15 MG tablet Take 1 tablet (15 mg total) by mouth 2 (two) times daily as needed for moderate pain (pain score 4-6).   naloxone  (NARCAN ) nasal  spray 4 mg/0.1 mL Use as directed for lethargic or respiratory depression after opioid use   Semaglutide , 1 MG/DOSE, (OZEMPIC , 1 MG/DOSE,) 4 MG/3ML SOPN Inject 1 mg into the skin once a week.   tiZANidine  (ZANAFLEX ) 2 MG tablet TAKE 1 TABLET (2MG  TOTAL) BY MOUTH TWICE A DAY AS NEEDED FOR MUSCLE SPASM   No facility-administered encounter medications on file as of 10/26/2023.    Allergies (verified) Metformin  and related and Orudis [ketoprofen]   History: Past Medical History:  Diagnosis Date   Anemia    Blood transfusion without reported diagnosis    Crohn's disease (HCC)    Disorder of sacroiliac joint    Exertional dyspnea 05/28/2021   Hyperlipidemia    Hypertension    Ischemic colitis (HCC)    Lumbago    Lumbar post-laminectomy syndrome    Lumbosacral neuritis    Lumbosacral radiculitis    OSA (obstructive sleep apnea) 05/28/2021   Sciatica    Past Surgical History:  Procedure Laterality Date   ABDOMINAL HYSTERECTOMY  1982   DUB; ovaries intact   bladder surg  1992   bladder retraction   BREAST BIOPSY     BREAST REDUCTION SURGERY  1984   CARDIAC CATHETERIZATION  1990's   Maryan Smalling   COLON SURGERY  05/11/2011   [facility] of adhesions Crohn's   MASS EXCISION Left 01/03/2018   Procedure: EXCISION LEFT UPPER BACK LIPOMA ERAS PATH;  Surgeon: Sim Dryer, MD;  Location: [facility];  Service: General;  Laterality: Left;   REDUCTION MAMMAPLASTY     SPINE SURGERY     5 total (1 upper, 4 lumbar)   SPINE SURGERY     nerve stimulator on right side   wrist and hand Right 07/2021   Dr. Tommy Frames   Family History  Problem Relation Age of Onset   Hyperlipidemia Mother    Heart attack Mother    Heart disease Mother 38       CHF; CAD   Hypertension Mother    Heart failure Mother    Diabetes Sister    Lupus Sister    Kidney failure Sister    Hyperlipidemia Sister    Hypertension Sister    Stroke Maternal Aunt    Hypertension Brother    Colon cancer  Neg Hx    Rectal cancer Neg Hx    Breast cancer Neg Hx    Social History   Socioeconomic History   Marital status: Widowed    Spouse name: Not on file   Number of children: 3   Years of education: Not on file   Highest education level: 9th grade  Occupational History   Occupation: disability    Comment: 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  Tobacco Use   Smoking status: Former    Current packs/day: 0.00    Average packs/day: 0.5 packs/day for 20.0 years (10.0 ttl pk-yrs)    Types: Cigarettes    Start date: 07/21/1988    Quit date: 07/21/2008    Years since  quitting: 15.2   Smokeless tobacco: Never  Vaping Use   Vaping status: Never Used  Substance and Sexual Activity   Alcohol  use: No   Drug use: No   Sexual activity: Not on file  Other Topics Concern   Not on file  Social History Narrative   Marital status: widowed since 2002; not dating which is bad in 2018      Children: 3 children (45, 47, 20); 13 grandchildren; 1 gg      Employment: disability since 2009; retail      Lives: alone in [place] in apartment      Tobacco: quit in 2011      Alcohol : none      Drugs: none      Exercise: minimal in 2018      ADLs: independent with ADLs; drives      Seatbelt: 100%; no texting.   Social Drivers of Corporate investment banker Strain: Low Risk  (10/26/2023)   Overall Financial Resource Strain (CARDIA)    Difficulty of Paying Living Expenses: Not very hard  Food Insecurity: No Food Insecurity (10/26/2023)   Hunger Vital Sign    Worried About Running Out of Food in the Last Year: Never true    Ran Out of Food in the Last Year: Never true  Transportation Needs: No Transportation Needs (10/26/2023)   PRAPARE - Administrator, Civil Service (Medical): No    Lack of Transportation (Non-Medical): No  Physical Activity: Inactive (10/26/2023)   Exercise Vital Sign    Days of Exercise per Week: 0 days    Minutes of Exercise per Session: 0 min  Stress: No Stress Concern Present (10/26/2023)    [employer] of Occupational Health - Occupational Stress Questionnaire    Feeling of Stress : Only a little  Social Connections: Unknown (10/26/2023)   Social Connection and Isolation Panel [NHANES]    Frequency of Communication with Friends and Family: Patient declined    Frequency of Social Gatherings with Friends and Family: Once a week    Attends Religious Services: More than 4 times per year    Active Member of [employer] or Organizations: Yes    Attends Banker Meetings: More than 4 times per year    Marital Status: Widowed    Tobacco Counseling Counseling given: Not Answered    Clinical Intake:  Pre-visit preparation completed: Yes  Pain : 0-10 Pain Score: 9  Pain Type: Chronic pain Pain Location: Back Pain Orientation: Lower Pain Radiating Towards: TOP OF BUTTOCKS Pain Descriptors / Indicators: Discomfort, Constant, Sharp Pain Onset: More than a month ago Pain Frequency: Constant (WHILE STANDING/SITTING) Pain Relieving Factors: PAIN MANAGEMENT Effect of Pain on Daily Activities: Pain can diminish job performance, lower motivation to exercise and prevent you from completing daily tasks.  Pain produces disability and affects the quality of life.  Pain Relieving Factors: PAIN MANAGEMENT  BMI - recorded: 37.42 Nutritional Status: BMI > 30  Obese Nutritional Risks: None Diabetes: No  Lab Results  Component Value Date   HGBA1C 6.9 10/27/2022   HGBA1C 7.0 (H) 07/28/2022   HGBA1C 6.1 01/28/2022     How often do you need to have someone help you when you read instructions, pamphlets, or other written materials from your doctor or pharmacy?: 1 - Never What is the last grade level you completed in school?: 9TH GRADE  Interpreter Needed?: No  Information entered by :: Druscilla Gerhard, LPN.   Activities of Daily Living  10/26/2023   12:02 PM 10/22/2023    3:09 PM  In your present state of health, do you have any difficulty performing the following  activities:  Hearing? 0 0  Vision? 1 1  Difficulty concentrating or making decisions? 0 0  Walking or climbing stairs? 1 1  Dressing or bathing? 0 0  Doing errands, shopping? 1 1  Preparing Food and eating ? Y Y  Using the Toilet? N N  In the past six months, have you accidently leaked urine? Y Y  Do you have problems with loss of bowel control? N N  Managing your Medications? N N  Managing your Finances? N N  Housekeeping or managing your Housekeeping? N N    Patient Care Team: Genora Kidd, MD as PCP - General (Family Medicine) Maudine Sos, MD as PCP - Cardiology (Cardiology) Arn Lane, MD as Consulting Physician (Family Medicine) [facility] as Consulting Physician (Optometry)  Indicate any recent Medical Services you may have received from other than [facility] providers in the past year (date may be approximate).     Assessment:    This is a routine wellness examination for [name] [name].  Hearing/Vision screen Hearing Screening - Comments:: Denies hearing difficulties.  Vision Screening - Comments:: Wears rx glasses - up to date with routine eye exams with [facility]    Goals Addressed               This Visit's Progress     Patient Stated (pt-stated)        10/26/2023: To be pain free in my back.       Depression Screen     10/26/2023   12:05 PM 10/17/2023    8:22 AM 08/01/2023    8:43 AM 07/10/2023    2:56 PM 06/29/2023    9:04 AM 05/12/2023    8:25 AM 04/04/2023    9:39 AM  PHQ 2/9 Scores  PHQ - 2 Score 0 0 0 2 3 0 0  PHQ- 9 Score 0   5 6      Fall Risk     10/26/2023   12:00 PM 10/22/2023    3:09 PM 10/17/2023    8:22 AM 08/01/2023    8:43 AM 06/29/2023    8:42 AM  Fall Risk   Falls in the past year? 0 0 0 0 0  Number falls in past yr: 0 0   0  Injury with Fall? 0 0   0  Risk for fall due to : No Fall Risks      Follow up Falls prevention discussed;Falls evaluation completed        MEDICARE RISK AT  HOME:  Medicare Risk at Home Any stairs in or around the home?: Yes If so, are there any without handrails?: No Home free of loose throw rugs in walkways, pet beds, electrical cords, etc?: Yes Adequate lighting in your home to reduce risk of falls?: Yes Life alert?: No Use of a cane, walker or w/c?: Yes (PT STATED SOMETIMES) Grab bars in the bathroom?: No Shower chair or bench in shower?: No Elevated toilet seat or a handicapped toilet?: No  TIMED UP AND GO:  Was the test performed?  No  Cognitive Function: 6CIT completed    10/26/2023   12:05 PM  MMSE - Mini Mental State Exam  Not completed: Unable to complete        10/26/2023   11:58 AM 10/24/2022    2:08 PM  6CIT Screen  What Year? 0 points 0 points  What month? 0 points 0 points  What time? 0 points 0 points  Count back from 20 0 points 0 points  Months in reverse 0 points 0 points  Repeat phrase 0 points 0 points  Total Score 0 points 0 points    Immunizations Immunization History  Administered Date(s) Administered   Fluad Quad(high Dose 65+) 07/28/2022   Fluad Trivalent(High Dose 65+) 06/29/2023   Influenza Inj Mdck Quad Pf 04/03/2017   Influenza Split 04/23/2012   Influenza, Seasonal, Injecte, Preservative Fre 05/01/2013   Influenza,inj,Quad PF,6+ Mos 04/10/2014, 03/09/2016, 03/07/2018, 04/15/2019, 03/17/2021   Influenza-Unspecified 03/29/2016, 04/03/2017, 06/29/2023   PNEUMOCOCCAL CONJUGATE-20 07/28/2022   Tdap 11/18/2008, 05/10/2016    Screening Tests Health Maintenance  Topic Date Due   COVID-19 Vaccine (1) Never done   FOOT EXAM  Never done   OPHTHALMOLOGY EXAM  Never done   Zoster Vaccines- Shingrix (1 of 2) Never done   HEMOGLOBIN A1C  04/29/2023   Diabetic kidney evaluation - Urine ACR  09/27/2023   MAMMOGRAM  11/26/2023   INFLUENZA VACCINE  01/19/2024   Diabetic kidney evaluation - eGFR measurement  10/24/2024   Medicare Annual Wellness (AWV)  10/25/2024   DTaP/Tdap/Td (3 - Td or Tdap)  05/10/2026   Colonoscopy  06/27/2027   Pneumonia Vaccine 68+ Years old  Completed   DEXA SCAN  Completed   Hepatitis C Screening  Completed   HPV VACCINES  Aged Out   Meningococcal B Vaccine  Aged Out    Health Maintenance  Health Maintenance Due  Topic Date Due   COVID-19 Vaccine (1) Never done   FOOT EXAM  Never done   OPHTHALMOLOGY EXAM  Never done   Zoster Vaccines- Shingrix (1 of 2) Never done   HEMOGLOBIN A1C  04/29/2023   Diabetic kidney evaluation - Urine ACR  09/27/2023   Health Maintenance Items Addressed: Yes Patient is aware of current care gaps. Patient is due for Shibgrix, HgA1C, Urine ACR, Foot Exam. Patient is up to date with eye exam.    Additional Screening:  Vision Screening: Recommended annual ophthalmology exams for early detection of glaucoma and other disorders of the eye.  Dental Screening: Recommended annual dental exams for proper oral hygiene  Community Resource Referral / Chronic Care Management: CRR required this visit?  No   CCM required this visit?  No     Plan:     I have personally reviewed and noted the following in the patient's chart:   Medical and social history Use of alcohol , tobacco or illicit drugs  Current medications and supplements including opioid prescriptions. Patient is currently taking opioid prescriptions. Information provided to patient regarding non-opioid alternatives. Patient advised to discuss non-opioid treatment plan with their provider. Functional ability and status Nutritional status Physical activity Advanced directives List of other physicians Hospitalizations, surgeries, and ER visits in previous 12 months Vitals Screenings to include cognitive, depression, and falls Referrals and appointments  In addition, I have reviewed and discussed with patient certain preventive protocols, quality metrics, and best practice recommendations. A written personalized care plan for preventive services as well as  general preventive health recommendations were provided to patient.     Margette Sheldon, LPN   06/25/1094   After Visit Summary: (MyChart) Due to this being a telephonic visit, the after visit summary with patients personalized plan was offered to patient via MyChart   Nurse Notes: Patient is aware of current care gaps. Patient  is due for Shibgrix, HgA1C, Urine ACR, Foot Exam. Patient is up to date with eye exam with [facility].

## 2023-10-26 NOTE — Patient Instructions (Signed)
 [name] , Thank you for taking time out of your busy schedule to complete your Annual Wellness Visit with me. I enjoyed our conversation and look forward to speaking with you again next year. I, as well as your care team,  appreciate your ongoing commitment to your health goals. Please review the following plan we discussed and let me know if I can assist you in the future. Your Game plan/ To Do List    Referrals: If you haven't heard from the office you've been referred to, please reach out to them at the phone provided.   Follow up Visits: Next Medicare AWV with our clinical staff: 10/31/2024 at 1:30 pm PHONE VISIT with Nurse Health Advisor   Have you seen your provider in the last 6 months (3 months if uncontrolled diabetes)? Yes Next Office Visit with your provider: Office will call to schedule  Clinician Recommendations:  Aim for 30 minutes of exercise or brisk walking, 6-8 glasses of water, and 5 servings of fruits and vegetables each day.       This is a list of the screening recommended for you and due dates:  Health Maintenance  Topic Date Due   COVID-19 Vaccine (1) Never done   Complete foot exam   Never done   Eye exam for diabetics  Never done   Zoster (Shingles) Vaccine (1 of 2) Never done   Hemoglobin A1C  04/29/2023   Yearly kidney health urinalysis for diabetes  09/27/2023   Mammogram  11/26/2023   Flu Shot  01/19/2024   Yearly kidney function blood test for diabetes  10/24/2024   Medicare Annual Wellness Visit  10/25/2024   DTaP/Tdap/Td vaccine (3 - Td or Tdap) 05/10/2026   Colon Cancer Screening  06/27/2027   Pneumonia Vaccine  Completed   DEXA scan (bone density measurement)  Completed   Hepatitis C Screening  Completed   HPV Vaccine  Aged Out   Meningitis B Vaccine  Aged Out    Advanced directives: (Provided) Advance directive discussed with you today. I have provided a copy for you to complete at home and have notarized. Once this is complete, please bring a  copy in to our office so we can scan it into your chart.  Advance Care Planning is important because it:  ensures individuals have a say in their future medical care, reduces burden on loved ones, and helps healthcare professionals understand their preferences.  [x]  Makes sure you receive the medical care that is consistent with your values, goals, and preferences  [x]  It provides guidance to your family and loved ones and reduces their decisional burden about whether or not they are making the right decisions based on your wishes.  Follow the link provided in your after visit summary or read over the paperwork we have mailed to you to help you started getting your Advance Directives in place. If you need assistance in completing these, please reach out to us  so that we can help you!  See attachments for Preventive Care and Fall Prevention Tips.

## 2023-10-28 ENCOUNTER — Encounter (HOSPITAL_BASED_OUTPATIENT_CLINIC_OR_DEPARTMENT_OTHER): Payer: Self-pay | Admitting: Cardiovascular Disease

## 2023-11-02 ENCOUNTER — Other Ambulatory Visit: Payer: Self-pay | Admitting: Student

## 2023-11-02 ENCOUNTER — Other Ambulatory Visit: Payer: Self-pay

## 2023-11-02 MED ORDER — LIDOCAINE 5 % EX PTCH
1.0000 | MEDICATED_PATCH | CUTANEOUS | 0 refills | Status: DC
  Filled 2023-11-02: qty 30, 30d supply, fill #0

## 2023-11-06 ENCOUNTER — Other Ambulatory Visit: Payer: Self-pay | Admitting: Student

## 2023-11-06 DIAGNOSIS — E119 Type 2 diabetes mellitus without complications: Secondary | ICD-10-CM

## 2023-11-07 ENCOUNTER — Other Ambulatory Visit: Payer: Self-pay | Admitting: Student

## 2023-11-07 ENCOUNTER — Other Ambulatory Visit: Payer: Self-pay

## 2023-11-07 DIAGNOSIS — E119 Type 2 diabetes mellitus without complications: Secondary | ICD-10-CM

## 2023-11-07 MED FILL — Losartan Potassium Tab 25 MG: ORAL | 60 days supply | Qty: 30 | Fill #0 | Status: AC

## 2023-11-15 ENCOUNTER — Encounter: Payer: Self-pay | Admitting: Registered Nurse

## 2023-11-15 ENCOUNTER — Other Ambulatory Visit: Payer: Self-pay

## 2023-11-15 ENCOUNTER — Other Ambulatory Visit: Payer: Self-pay | Admitting: Student

## 2023-11-15 ENCOUNTER — Encounter: Attending: Physical Medicine & Rehabilitation | Admitting: Registered Nurse

## 2023-11-15 DIAGNOSIS — E119 Type 2 diabetes mellitus without complications: Secondary | ICD-10-CM

## 2023-11-15 DIAGNOSIS — Z79891 Long term (current) use of opiate analgesic: Secondary | ICD-10-CM | POA: Diagnosis not present

## 2023-11-15 DIAGNOSIS — Z5181 Encounter for therapeutic drug level monitoring: Secondary | ICD-10-CM | POA: Diagnosis not present

## 2023-11-15 DIAGNOSIS — M5416 Radiculopathy, lumbar region: Secondary | ICD-10-CM | POA: Insufficient documentation

## 2023-11-15 DIAGNOSIS — G894 Chronic pain syndrome: Secondary | ICD-10-CM | POA: Diagnosis not present

## 2023-11-15 MED ORDER — MORPHINE SULFATE 15 MG PO TABS: 15.0000 mg | ORAL_TABLET | Freq: Two times a day (BID) | ORAL | 0 refills | Status: DC | PRN

## 2023-11-15 MED ORDER — MORPHINE SULFATE ER 15 MG PO TBCR: 15.0000 mg | EXTENDED_RELEASE_TABLET | Freq: Three times a day (TID) | ORAL | 0 refills | Status: DC

## 2023-11-15 NOTE — Progress Notes (Signed)
 Subjective:    Patient ID: Filiberto Hug, female    DOB: 03-22-1956, 68 y.o.   MRN: [id number]  HPI: DANNAH RYLES is a 68 y.o. female her appointment was changed to a Virtual visit, she reports she is having nausea, vomiting and diarrhea, she was instructed to F/U with her PCP she verbalizes understanding. I connected with Dollie Freshwater  by a video enabled telemedicine application and verified that I am speaking with the correct person using two identifiers.  Location: Patient: In her Home Provider: In the Ofice    I discussed the limitations of evaluation and management by telemedicine and the availability of in person appointments. The patient expressed understanding and agreed to proceed.  She states her pain is located in her  lower back radiating into her right buttock. She rates her pain 8. Her current exercise regime is walking and performing stretching exercises.  Ms. Reagor Morphine  equivalent is 75.00 MME.   Last UDS was Performed on 08/01/2023, it was consistent.    Pain Inventory Average Pain 8 Pain Right Now 8 My pain is sharp, tingling, and aching  In the last 24 hours, has pain interfered with the following? General activity 8 Relation with others 8 Enjoyment of life 8 What TIME of day is your pain at its worst? varies Sleep (in general) Poor  Pain is worse with: walking, bending, and standing Pain improves with: rest, heat/ice, and medication Relief from Meds: 1  Family History  Problem Relation Age of Onset   Hyperlipidemia Mother    Heart attack Mother    Heart disease Mother 4       CHF; CAD   Hypertension Mother    Heart failure Mother    Diabetes Sister    Lupus Sister    Kidney failure Sister    Hyperlipidemia Sister    Hypertension Sister    Stroke Maternal Aunt    Hypertension Brother    Colon cancer Neg Hx    Rectal cancer Neg Hx    Breast cancer Neg Hx    Social History   Socioeconomic History   Marital status: Widowed     Spouse name: Not on file   Number of children: 3   Years of education: Not on file   Highest education level: 9th grade  Occupational History   Occupation: disability    Comment: 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  Tobacco Use   Smoking status: Former    Current packs/day: 0.00    Average packs/day: 0.5 packs/day for 20.0 years (10.0 ttl pk-yrs)    Types: Cigarettes    Start date: 07/21/1988    Quit date: 07/21/2008    Years since quitting: 15.3   Smokeless tobacco: Never  Vaping Use   Vaping status: Never Used  Substance and Sexual Activity   Alcohol  use: No   Drug use: No   Sexual activity: Not on file  Other Topics Concern   Not on file  Social History Narrative   Marital status: widowed since 2002; not dating which is bad in 2018      Children: 3 children (45, 69, 40); 13 grandchildren; 1 gg      Employment: disability since 2009; retail      Lives: alone in [place] in apartment      Tobacco: quit in 2011      Alcohol : none      Drugs: none      Exercise: minimal in 2018      ADLs:  independent with ADLs; drives      Seatbelt: 100%; no texting.   Social Drivers of Corporate investment banker Strain: Low Risk  (10/26/2023)   Overall Financial Resource Strain (CARDIA)    Difficulty of Paying Living Expenses: Not very hard  Food Insecurity: No Food Insecurity (10/26/2023)   Hunger Vital Sign    Worried About Running Out of Food in the Last Year: Never true    Ran Out of Food in the Last Year: Never true  Transportation Needs: No Transportation Needs (10/26/2023)   PRAPARE - Administrator, Civil Service (Medical): No    Lack of Transportation (Non-Medical): No  Physical Activity: Inactive (10/26/2023)   Exercise Vital Sign    Days of Exercise per Week: 0 days    Minutes of Exercise per Session: 0 min  Stress: No Stress Concern Present (10/26/2023)   [employer] of Occupational Health - Occupational Stress Questionnaire    Feeling of Stress : Only a little  Social  Connections: Unknown (10/26/2023)   Social Connection and Isolation Panel [NHANES]    Frequency of Communication with Friends and Family: Patient declined    Frequency of Social Gatherings with Friends and Family: Once a week    Attends Religious Services: More than 4 times per year    Active Member of [employer] or Organizations: Yes    Attends Banker Meetings: More than 4 times per year    Marital Status: Widowed   Past Surgical History:  Procedure Laterality Date   ABDOMINAL HYSTERECTOMY  1982   DUB; ovaries intact   bladder surg  1992   bladder retraction   BREAST BIOPSY     BREAST REDUCTION SURGERY  1984   CARDIAC CATHETERIZATION  1990's   Maryan Smalling   COLON SURGERY  05/11/2011   [facility] of adhesions Crohn's   MASS EXCISION Left 01/03/2018   Procedure: EXCISION LEFT UPPER BACK LIPOMA ERAS PATH;  Surgeon: Sim Dryer, MD;  Location: [facility];  Service: General;  Laterality: Left;   REDUCTION MAMMAPLASTY     SPINE SURGERY     5 total (1 upper, 4 lumbar)   SPINE SURGERY     nerve stimulator on right side   wrist and hand Right 07/2021   Dr. Tommy Frames   Past Surgical History:  Procedure Laterality Date   ABDOMINAL HYSTERECTOMY  1982   DUB; ovaries intact   bladder surg  1992   bladder retraction   BREAST BIOPSY     BREAST REDUCTION SURGERY  1984   CARDIAC CATHETERIZATION  1990's   Maryan Smalling   COLON SURGERY  05/11/2011   [facility] of adhesions Crohn's   MASS EXCISION Left 01/03/2018   Procedure: EXCISION LEFT UPPER BACK LIPOMA ERAS PATH;  Surgeon: Sim Dryer, MD;  Location: [facility];  Service: General;  Laterality: Left;   REDUCTION MAMMAPLASTY     SPINE SURGERY     5 total (1 upper, 4 lumbar)   SPINE SURGERY     nerve stimulator on right side   wrist and hand Right 07/2021   Dr. Tommy Frames   Past Medical History:  Diagnosis Date   Anemia    Blood transfusion without reported diagnosis    Crohn's  disease (HCC)    Disorder of sacroiliac joint    Exertional dyspnea 05/28/2021   Hyperlipidemia    Hypertension    Ischemic colitis (HCC)    Lumbago  Lumbar post-laminectomy syndrome    Lumbosacral neuritis    Lumbosacral radiculitis    OSA (obstructive sleep apnea) 05/28/2021   Sciatica    There were no vitals taken for this visit.  Opioid Risk Score:   Fall Risk Score:  `1  Depression screen [facility] 2/9     10/26/2023   12:05 PM 10/17/2023    8:22 AM 08/01/2023    8:43 AM 07/10/2023    2:56 PM 06/29/2023    9:04 AM 05/12/2023    8:25 AM 04/04/2023    9:39 AM  Depression screen PHQ 2/9  Decreased Interest 0 0 0 1 2 0 0  Down, Depressed, Hopeless 0 0 0 1 1 0 0  PHQ - 2 Score 0 0 0 2 3 0 0  Altered sleeping 0   1 1    Tired, decreased energy 0   1 1    Change in appetite 0   1 1    Feeling bad or failure about yourself  0   0 0    Trouble concentrating 0   0 0    Moving slowly or fidgety/restless 0   0 0    Suicidal thoughts 0   0 0    PHQ-9 Score 0   5 6    Difficult doing work/chores Not difficult at all            Review of Systems  Musculoskeletal:  Positive for back pain.       Right shoulder pain  All other systems reviewed and are negative.     Objective:   Physical Exam Vitals and nursing note reviewed.  Musculoskeletal:     Comments: No Physical Exam Performed: Virtual Visit         Assessment & Plan:  1.Lumbar Postlaminectomy/ Spinal Stenosis Lumbar Region/ Post-Op Pain/Low back pain with radiating symptoms in L3-4 distribution:  continues to be limited by pain. Refilled:  MSIR 15 mg one tablet twice a day as needed for moderate to sever pain #60 and  Morphine  Sulfate ER 15 mg Q 8 hours. 11/15/2023. We will continue the opioid monitoring program, this consists of regular clinic visits, examinations, urine drug screen, pill counts as well as use of [place]  Controlled Substance Reporting system. A 12 month History has been reviewed on the [place] Controlled Substance Reporting System on 11/15/2023. Encouraged to Continue exercise regime.   2. Lumbar Radiculopathy: Continue Topamax :.Continue to Monitor 11/15/2023 3.Chronic  Right Knee Pain: No complaints today.  Continue with Ice Therapy and  Voltaren  Gel. 11/15/2023 4. Muscle Spasm: Continue current treatment: Tizanidine . 11/15/2023  5. Lipoma: S/P Left  Lipoma Excision of upper back  By Dr. Afton Horse on 01/03/2018. Surgery Following. Ms. Masih was instructed to scheduled an appointment with Dr Afton Horse, to evaluate Lipoma, she verbalizes understanding. Continue to Monitor.11/15/2023.   6. Paresthesia of Skin: Continue Topamax . Continue to monitor. 11/15/2023 7. Adhesive Capsulitis of Right Shoulder:Right Shoulder Tendonitis Pain: . S/P Right Shoulder Cortisone Injection with Dr Sharl Davies,  no relief noted. Continue to monitor. Continue HEP as Tolerated. 11/15/2023  8. Polyarthralgia: Continue HEP as Tolerated. Continue to Monitor.11/15/2023  9. Sacral Pain: No complaints today. Continue to Monitor. 11/15/2023 10. Right Hand Pain: No complaints today. S/P :Post right middle finger trigger finger release carpal tunnel release. Dr Murrel Arnt Following.  Continue to monitor. 11/15/2023  11. Left hand Pain : No complaints today. Left Index Trigger Finger: Dr Murrel Arnt following. 11/15/2023   F/U in 1 month  Virtual Visit Established Patient Location of Patient: In her Home Location of Provider: In the office

## 2023-11-16 ENCOUNTER — Other Ambulatory Visit: Payer: Self-pay

## 2023-11-16 MED ORDER — OZEMPIC (1 MG/DOSE) 4 MG/3ML ~~LOC~~ SOPN
1.0000 mg | PEN_INJECTOR | SUBCUTANEOUS | 0 refills | Status: DC
Start: 2023-11-16 — End: 2023-12-15
  Filled 2023-11-16: qty 3, 28d supply, fill #0

## 2023-11-21 ENCOUNTER — Other Ambulatory Visit: Payer: Self-pay

## 2023-11-28 ENCOUNTER — Encounter: Payer: Self-pay | Admitting: *Deleted

## 2023-11-30 ENCOUNTER — Ambulatory Visit: Admitting: Student

## 2023-11-30 ENCOUNTER — Other Ambulatory Visit (HOSPITAL_BASED_OUTPATIENT_CLINIC_OR_DEPARTMENT_OTHER): Payer: Self-pay | Admitting: Family

## 2023-11-30 ENCOUNTER — Other Ambulatory Visit: Payer: Self-pay | Admitting: Student

## 2023-11-30 ENCOUNTER — Other Ambulatory Visit: Payer: Self-pay

## 2023-11-30 VITALS — BP 122/64 | HR 96 | Wt 213.0 lb

## 2023-11-30 DIAGNOSIS — F320 Major depressive disorder, single episode, mild: Secondary | ICD-10-CM

## 2023-11-30 DIAGNOSIS — J069 Acute upper respiratory infection, unspecified: Secondary | ICD-10-CM | POA: Diagnosis not present

## 2023-11-30 MED ORDER — LIDOCAINE 5 % EX PTCH
1.0000 | MEDICATED_PATCH | CUTANEOUS | 0 refills | Status: DC
  Filled 2023-11-30: qty 30, 30d supply, fill #0

## 2023-11-30 MED ORDER — ESCITALOPRAM OXALATE 5 MG PO TABS
5.0000 mg | ORAL_TABLET | Freq: Every day | ORAL | 0 refills | Status: DC
  Filled 2023-11-30: qty 60, 60d supply, fill #0

## 2023-11-30 NOTE — Progress Notes (Signed)
  SUBJECTIVE:   CHIEF COMPLAINT / HPI:   [name] [name] is a 68 year old female who presents with a sore throat and hoarseness.  Her symptoms began with a scratchy throat sensation on Sunday, worsening by Monday with hoarseness. By Tuesday, she experienced a more consistent sore throat, nasal congestion, and persistent hoarseness. She recalls a similar episode last year that led to bronchitis. She uses Flonase  daily and has been taking DayQuil for her sore throat. She denies fever but reports brief chills. She has a poor appetite, nausea without vomiting, and denies diarrhea. No shortness of breath or significant pain elsewhere.  PERTINENT  PMH / PSH: HTN, Crohn's disease, HLD, OSA, T2DM, osteopenia, former smoker  OBJECTIVE:  BP 122/64   Pulse 96   Wt 213 lb (96.6 kg)   SpO2 98%   BMI 36.56 kg/m  General: Well-appearing, NAD HEENT: Normal TMs, clear oropharynx with trace erythema without exudate CV: RRR, no murmurs appreciable Pulm: CTAB, normal WOB Abdomen: Normoactive bowel sounds  ASSESSMENT/PLAN:   Assessment & Plan Viral URI with cough Examination reveals throat irritation but no signs of bacterial infection. Suspected viral etiology given the constellation of symptoms and hoarse voice, commonly associated with viral infections. Flu and COVID testing would not alter management as treatment remains supportive. Symptoms typically persist for 7-10 days; if she extends beyond two weeks or worsens, further evaluation may be necessary. - Encourage fluid intake and maintaining nutrition. - Recommend over-the-counter medications for symptom relief - Advise the use of honey for throat discomfort - Advise on infection control measures, including hand washing and mask-wearing. Return in about 2 weeks (around 12/14/2023) for health maintenance. Veronia Goon, DO 11/30/2023, 10:57 AM PGY-3, [facility]

## 2023-11-30 NOTE — Patient Instructions (Addendum)
 It was great to see you today! Thank you for choosing [facility] for your primary care.  It was so good to see you today!   This is most likely a viral infection. This will take time to get over. The treatment for this is supportive care. You can alternate Tylenol  and Ibuprofen for pain or fever every 3 hours (there should be 6 hours in between each dose of Tylenol , and 6 hours in between doses of Ibuprofen). You can give a teaspoon of honey by itself or mixed with water to help cough. Steam baths, Vicks vapor rub, a humidifier and nasal saline spray can help with congestion.   It is important to keep them hydrated throughout this time!  Frequent hand washing to prevent recurrent illnesses is important.   Please come back for recurrent symptoms that are not improving in 1-2 weeks, unable to keep fluids down, or any concerning symptoms to you.   Why should I AVOID in over-the-counter cough medicine?  Cough medicines have NO benefit in reducing frequency or severity of cough in children. This has been shown in many studies over several decades.  Cough medicines contain ingredients that may have many side effects.  Since they have side effects and provide no benefit, the risks of using cough medicines outweigh the benefit.   If I can't use cough medicines, what CAN I use?  Honey Has been proven to reduce cough in studies, with no significant side effects Can be used as long as your child is older than 55 year old (if younger than 67 year old, honey can cause botulism) Nasal saline Helps with congestion, which is often 1 of the most bothersome symptoms of a cold. Reducing congestion often also helps reduce cough   If you haven't already, sign up for My Chart to have easy access to your labs results, and communication with your primary care physician.  Return in about 2 weeks (around 12/14/2023) for health maintenance. Please arrive 15 minutes before your appointment to ensure smooth check  in process.  We appreciate your efforts in making this happen.  Thank you for allowing me to participate in your care, Veronia Goon, DO 11/30/2023, 10:59 AM PGY-3, [facility]

## 2023-12-01 ENCOUNTER — Other Ambulatory Visit: Payer: Self-pay

## 2023-12-01 ENCOUNTER — Other Ambulatory Visit: Payer: Self-pay | Admitting: Student

## 2023-12-01 MED FILL — Atorvastatin Calcium Tab 80 MG (Base Equivalent): ORAL | 90 days supply | Qty: 90 | Fill #0 | Status: AC

## 2023-12-06 ENCOUNTER — Encounter: Payer: Self-pay | Admitting: Student

## 2023-12-07 ENCOUNTER — Other Ambulatory Visit: Payer: Self-pay

## 2023-12-07 MED ORDER — ONDANSETRON 4 MG PO TBDP
4.0000 mg | ORAL_TABLET | Freq: Three times a day (TID) | ORAL | 0 refills | Status: DC | PRN
  Filled 2023-12-07: qty 20, 7d supply, fill #0

## 2023-12-13 ENCOUNTER — Encounter: Admitting: Registered Nurse

## 2023-12-14 ENCOUNTER — Other Ambulatory Visit: Payer: Self-pay

## 2023-12-15 ENCOUNTER — Encounter: Payer: Self-pay | Admitting: Student

## 2023-12-15 ENCOUNTER — Other Ambulatory Visit: Payer: Self-pay | Admitting: Student

## 2023-12-15 ENCOUNTER — Ambulatory Visit (INDEPENDENT_AMBULATORY_CARE_PROVIDER_SITE_OTHER): Admitting: Student

## 2023-12-15 ENCOUNTER — Other Ambulatory Visit: Payer: Self-pay

## 2023-12-15 ENCOUNTER — Other Ambulatory Visit: Payer: Self-pay | Admitting: Registered Nurse

## 2023-12-15 ENCOUNTER — Encounter: Attending: Physical Medicine & Rehabilitation | Admitting: Registered Nurse

## 2023-12-15 VITALS — Ht 64.0 in | Wt 216.0 lb

## 2023-12-15 VITALS — BP 125/68 | HR 85 | Temp 98.7°F | Ht 64.0 in | Wt 216.2 lb

## 2023-12-15 DIAGNOSIS — Z79891 Long term (current) use of opiate analgesic: Secondary | ICD-10-CM | POA: Diagnosis not present

## 2023-12-15 DIAGNOSIS — E119 Type 2 diabetes mellitus without complications: Secondary | ICD-10-CM

## 2023-12-15 DIAGNOSIS — M4807 Spinal stenosis, lumbosacral region: Secondary | ICD-10-CM | POA: Insufficient documentation

## 2023-12-15 DIAGNOSIS — Z1231 Encounter for screening mammogram for malignant neoplasm of breast: Secondary | ICD-10-CM

## 2023-12-15 DIAGNOSIS — G894 Chronic pain syndrome: Secondary | ICD-10-CM | POA: Diagnosis not present

## 2023-12-15 DIAGNOSIS — Z5181 Encounter for therapeutic drug level monitoring: Secondary | ICD-10-CM | POA: Diagnosis not present

## 2023-12-15 LAB — POCT GLYCOSYLATED HEMOGLOBIN (HGB A1C): HbA1c, POC (controlled diabetic range): 5.9 % (ref 0.0–7.0)

## 2023-12-15 MED ORDER — MORPHINE SULFATE ER 15 MG PO TBCR: 15.0000 mg | EXTENDED_RELEASE_TABLET | Freq: Three times a day (TID) | ORAL | 0 refills | Status: DC

## 2023-12-15 MED ORDER — OZEMPIC (1 MG/DOSE) 4 MG/3ML ~~LOC~~ SOPN
1.0000 mg | PEN_INJECTOR | SUBCUTANEOUS | 0 refills | Status: DC
  Filled 2023-12-15 (×2): qty 3, 28d supply, fill #0

## 2023-12-15 MED ORDER — TIZANIDINE HCL 2 MG PO TABS
2.0000 mg | ORAL_TABLET | Freq: Two times a day (BID) | ORAL | 1 refills | Status: AC | PRN
Start: 2023-12-15 — End: ?
  Filled 2023-12-15: qty 180, 90d supply, fill #0

## 2023-12-15 MED ORDER — MORPHINE SULFATE 15 MG PO TABS: 15.0000 mg | ORAL_TABLET | Freq: Two times a day (BID) | ORAL | 0 refills | Status: DC | PRN

## 2023-12-15 NOTE — Progress Notes (Signed)
 Subjective:    Patient ID: [name] [name], female    DOB: 11-Jan-1956, 68 y.o.   MRN: [id number]  HPI: BAHJA BENCE is a 68 y.o. female who returns for follow up appointment for chronic pain and medication refill. She states her pain is located in her lower back. She rates her pain 8. Her current exercise regime is walking and performing stretching exercises.  Ms. Bahner Morphine  equivalent is 75.00 MME.   UDS ordered today.     Pain Inventory Average Pain 8 Pain Right Now 8 My pain is constant, sharp, and aching  In the last 24 hours, has pain interfered with the following? General activity 8 Relation with others 8 Enjoyment of life 8 What TIME of day is your pain at its worst? morning , evening, and night Sleep (in general) Poor  Pain is worse with: walking, sitting, inactivity, standing, and some activites Pain improves with: rest, heat/ice, and medication Relief from Meds: 1  Family History  Problem Relation Age of Onset   Hyperlipidemia Mother    Heart attack Mother    Heart disease Mother 24       CHF; CAD   Hypertension Mother    Heart failure Mother    Diabetes Sister    Lupus Sister    Kidney failure Sister    Hyperlipidemia Sister    Hypertension Sister    Stroke Maternal Aunt    Hypertension Brother    Colon cancer Neg Hx    Rectal cancer Neg Hx    Breast cancer Neg Hx    Social History   Socioeconomic History   Marital status: Widowed    Spouse name: Not on file   Number of children: 3   Years of education: Not on file   Highest education level: 9th grade  Occupational History   Occupation: disability    Comment: 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  Tobacco Use   Smoking status: Former    Current packs/day: 0.00    Average packs/day: 0.5 packs/day for 20.0 years (10.0 ttl pk-yrs)    Types: Cigarettes    Start date: 07/21/1988    Quit date: 07/21/2008    Years since quitting: 15.4    Passive exposure: Past   Smokeless tobacco: Never  Vaping Use    Vaping status: Never Used  Substance and Sexual Activity   Alcohol  use: No   Drug use: No   Sexual activity: Not on file  Other Topics Concern   Not on file  Social History Narrative   Marital status: widowed since 2002; not dating which is bad in 2018      Children: 3 children (45, 45, 40); 13 grandchildren; 1 gg      Employment: disability since 2009; retail      Lives: alone in [place] in apartment      Tobacco: quit in 2011      Alcohol : none      Drugs: none      Exercise: minimal in 2018      ADLs: independent with ADLs; drives      Seatbelt: 100%; no texting.   Social Drivers of Health   Financial Resource Strain: Medium Risk (12/14/2023)   Overall Financial Resource Strain (CARDIA)    Difficulty of Paying Living Expenses: Somewhat hard  Food Insecurity: No Food Insecurity (12/14/2023)   Hunger Vital Sign    Worried About Running Out of Food in the Last Year: Never true    Ran Out of Food  in the Last Year: Never true  Transportation Needs: No Transportation Needs (12/14/2023)   PRAPARE - Administrator, Civil Service (Medical): No    Lack of Transportation (Non-Medical): No  Physical Activity: Inactive (12/14/2023)   Exercise Vital Sign    Days of Exercise per Week: 0 days    Minutes of Exercise per Session: Not on file  Stress: Stress Concern Present (12/14/2023)   [employer] of Occupational Health - Occupational Stress Questionnaire    Feeling of Stress: Very much  Social Connections: Moderately Integrated (12/14/2023)   Social Connection and Isolation Panel    Frequency of Communication with Friends and Family: Twice a week    Frequency of Social Gatherings with Friends and Family: Twice a week    Attends Religious Services: More than 4 times per year    Active Member of [employer] or Organizations: Yes    Attends Banker Meetings: More than 4 times per year    Marital Status: Widowed   Past Surgical History:  Procedure Laterality Date    ABDOMINAL HYSTERECTOMY  1982   DUB; ovaries intact   bladder surg  1992   bladder retraction   BREAST BIOPSY     BREAST REDUCTION SURGERY  1984   CARDIAC CATHETERIZATION  1990's   [name]   COLON SURGERY  05/11/2011   [facility] of adhesions Crohn's   MASS EXCISION Left 01/03/2018   Procedure: EXCISION LEFT UPPER BACK LIPOMA ERAS PATH;  Surgeon: Vanderbilt Ned, MD;  Location: [facility];  Service: General;  Laterality: Left;   REDUCTION MAMMAPLASTY     SPINE SURGERY     5 total (1 upper, 4 lumbar)   SPINE SURGERY     nerve stimulator on right side   wrist and hand Right 07/2021   Dr. Oneil Herald   Past Surgical History:  Procedure Laterality Date   ABDOMINAL HYSTERECTOMY  1982   DUB; ovaries intact   bladder surg  1992   bladder retraction   BREAST BIOPSY     BREAST REDUCTION SURGERY  1984   CARDIAC CATHETERIZATION  1990's   [name]   COLON SURGERY  05/11/2011   [facility] of adhesions Crohn's   MASS EXCISION Left 01/03/2018   Procedure: EXCISION LEFT UPPER BACK LIPOMA ERAS PATH;  Surgeon: Vanderbilt Ned, MD;  Location: [facility];  Service: General;  Laterality: Left;   REDUCTION MAMMAPLASTY     SPINE SURGERY     5 total (1 upper, 4 lumbar)   SPINE SURGERY     nerve stimulator on right side   wrist and hand Right 07/2021   Dr. Oneil Herald   Past Medical History:  Diagnosis Date   Anemia    Blood transfusion without reported diagnosis    Crohn's disease (HCC)    Disorder of sacroiliac joint    Exertional dyspnea 05/28/2021   Hyperlipidemia    Hypertension    Ischemic colitis (HCC)    Lumbago    Lumbar post-laminectomy syndrome    Lumbosacral neuritis    Lumbosacral radiculitis    OSA (obstructive sleep apnea) 05/28/2021   Sciatica    Ht 5' 4 (1.626 m)   Wt 216 lb (98 kg)   BMI 37.08 kg/m   Opioid Risk Score:   Fall Risk Score:  `1  Depression screen [facility] 2/9     12/15/2023    8:53 AM 12/15/2023    8:45 AM  11/30/2023  10:36 AM 10/26/2023   12:05 PM 10/17/2023    8:22 AM 08/01/2023    8:43 AM 07/10/2023    2:56 PM  Depression screen PHQ 2/9  Decreased Interest 0 0 1 0 0 0 1  Down, Depressed, Hopeless 0 0 0 0 0 0 1  PHQ - 2 Score 0 0 1 0 0 0 2  Altered sleeping 1 0 1 0   1  Tired, decreased energy 3 0 2 0   1  Change in appetite 3 0 3 0   1  Feeling bad or failure about yourself  0 0 0 0   0  Trouble concentrating 0 0 0 0   0  Moving slowly or fidgety/restless 0 0 0 0   0  Suicidal thoughts 0 0 0 0   0  PHQ-9 Score 7 0 7 0   5  Difficult doing work/chores    Not difficult at all        Review of Systems  Musculoskeletal:  Positive for back pain.  All other systems reviewed and are negative.      Objective:   Physical Exam Vitals and nursing note reviewed.  Constitutional:      Appearance: Normal appearance.  Cardiovascular:     Rate and Rhythm: Normal rate and regular rhythm.     Pulses: Normal pulses.     Heart sounds: Normal heart sounds.  Pulmonary:     Effort: Pulmonary effort is normal.     Breath sounds: Normal breath sounds.  Musculoskeletal:     Comments: Normal Muscle Bulk and Muscle Testing Reveals:  Upper Extremities: Full ROM and Muscle Strength 5/5 Lumbar Hypersensitivity Lower Extremities : Full ROM and Muscle Strength 5/5 Arises from Table slolwy Narrow Based Gait     Skin:    General: Skin is warm and dry.  Neurological:     Mental Status: She is alert and oriented to person, place, and time.  Psychiatric:        Mood and Affect: Mood normal.        Behavior: Behavior normal.          Assessment & Plan:  1.Lumbar Postlaminectomy/ Spinal Stenosis Lumbar Region/ Post-Op Pain/Low back pain with radiating symptoms in L3-4 distribution:  continues to be limited by pain. Refilled:  MSIR 15 mg one tablet twice a day as needed for moderate to sever pain #60 and  Morphine  Sulfate ER 15 mg Q 8 hours. 12/15/2023. We will continue the opioid monitoring  program, this consists of regular clinic visits, examinations, urine drug screen, pill counts as well as use of [place]  Controlled Substance Reporting system. A 12 month History has been reviewed on the [place]  Controlled Substance Reporting System on 11/15/2023. Encouraged to Continue exercise regime.   2. Lumbar Radiculopathy: Continue Topamax :.Continue to Monitor 12/15/2023 3.Chronic  Right Knee Pain: No complaints today.  Continue with Ice Therapy and  Voltaren  Gel. 12/15/2023 4. Muscle Spasm: Continue current treatment: Tizanidine . 12/15/2023  5. Lipoma: S/P Left  Lipoma Excision of upper back  By Dr. Vanderbilt on 01/03/2018. Surgery Following. Ms. Andrew was instructed to scheduled an appointment with Dr Vanderbilt, to evaluate Lipoma, she verbalizes understanding. Continue to Monitor.12/15/2023.   6. Paresthesia of Skin: Continue Topamax . Continue to monitor. 12/15/2023 7. Adhesive Capsulitis of Right Shoulder:Right Shoulder Tendonitis Pain: . S/P Right Shoulder Cortisone Injection with Dr Carilyn,  no relief noted. Continue to monitor. Continue HEP as Tolerated. 12/15/2023  8. Polyarthralgia: Continue HEP  as Tolerated. Continue to Monitor.12/15/2023  9. Sacral Pain: No complaints today. Continue to Monitor. 12/15/2023 10. Right Hand Pain: No complaints today. S/P :Post right middle finger trigger finger release carpal tunnel release. Dr Barbarann Following.  Continue to monitor. 12/15/2023  11. Left hand Pain : No complaints today. Left Index Trigger Finger: Dr Barbarann following. 06/287/2025   F/U in 1 month

## 2023-12-15 NOTE — Assessment & Plan Note (Signed)
 Throbbing pain and itching around stimulator site without visible rash or infection. Possible relation to stimulator's vibration.  - Discuss symptoms with pain specialist today - Recommend topical hydrocortisone for itching. - Refill tizanidine  prescription.

## 2023-12-15 NOTE — Assessment & Plan Note (Signed)
 Well-controlled with A1c of 5.9. On 1 mg Ozempic  without nausea or vomiting. Current management effective. - Continue current dose of Ozempic  1 mg. - Perform urine microalbumin/creatinine - Refer to ophthalmologist for comprehensive eye examination.

## 2023-12-15 NOTE — Progress Notes (Signed)
.     SUBJECTIVE:   CHIEF COMPLAINT / HPI: Health Maintenance  Discussed the use of AI scribe software for clinical note transcription with the patient, who gave verbal consent to proceed.  History of Present Illness [name] [name] is a 68 year old female with diabetes who presents for medication management and evaluation of pain near her spinal stimulator.  She takes Ozempic  1 mg weekly for diabetes and is due for a refill, with her last dose taken on Monday. She has been on this medication for a significant period without experiencing nausea or vomiting.  She experiences new onset pain near her spinal stimulator, beginning on Tuesday. Initially, there was itching, followed by throbbing pain attributed to the stimulator's vibration. The pain is described as a funny feeling with a pulling sensation from the stimulator site. There is no fever or swelling.  She uses tizanidine  as a muscle relaxer and requests a refill.  PERTINENT  PMH / PSH: Hypertension, OSA, diabetes  OBJECTIVE:   BP 125/68   Pulse 85   Temp 98.7 F (37.1 C) (Oral)   Ht 5' 4 (1.626 m)   Wt 216 lb 4 oz (98.1 kg)   SpO2 97%   BMI 37.12 kg/m   General: Well appearing, NAD, awake, alert, responsive to questions Head: Normocephalic atraumatic Respiratory:  chest rises symmetrically,  no increased work of breathing Back: No overlying erythema or fluctuance around spinal stimulator, nontender to palpation Extremities: Moves upper and lower extremities freely  Diabetic Foot Exam - Simple   Simple Foot Form Visual Inspection No deformities, no ulcerations, no other skin breakdown bilaterally: Yes Sensation Testing Intact to touch and monofilament testing bilaterally: Yes Pulse Check Posterior Tibialis and Dorsalis pulse intact bilaterally: Yes Comments      ASSESSMENT/PLAN:   Assessment & Plan Diabetes mellitus without complication (HCC) Well-controlled with A1c of 5.9. On 1 mg Ozempic  without nausea or  vomiting. Current management effective. - Continue current dose of Ozempic  1 mg. - Perform urine microalbumin/creatinine - Refer to ophthalmologist for comprehensive eye examination. Encounter for screening mammogram for malignant neoplasm of breast Due for mammogram as last one was in 2023. - Order mammogram and provide contact information for scheduling. Chronic pain syndrome Throbbing pain and itching around stimulator site without visible rash or infection. Possible relation to stimulator's vibration.  - Discuss symptoms with pain specialist today - Recommend topical hydrocortisone for itching. - Refill tizanidine  prescription.   Wendel Lesch, MD [facility]

## 2023-12-15 NOTE — Patient Instructions (Addendum)
 It was great to see you! Thank you for allowing me to participate in your care!   Our plans for today:  - You need a mammogram to prevent breast cancer.  Please schedule an appointment.  You can call [phone].    - I am putting in a referral for an eye doctor, you should hear back in the next 3 to 4 weeks-reach out if you do not hear back at that time - I will let you know what your urine test shows as well - I refilled your tizanidine , please follow-up with your pain medicine doctor about your spinal stimulator  Take care and seek immediate care sooner if you develop any concerns.  Wendel Lesch, MD

## 2023-12-16 LAB — MICROALBUMIN / CREATININE URINE RATIO
Creatinine, Urine: 298.2 mg/dL
Microalb/Creat Ratio: 3 mg/g{creat} (ref 0–29)
Microalbumin, Urine: 10 ug/mL

## 2023-12-18 ENCOUNTER — Ambulatory Visit: Payer: Self-pay | Admitting: Student

## 2023-12-20 LAB — TOXASSURE SELECT,+ANTIDEPR,UR

## 2023-12-23 ENCOUNTER — Encounter: Payer: Self-pay | Admitting: Registered Nurse

## 2023-12-25 ENCOUNTER — Telehealth: Payer: Self-pay | Admitting: Registered Nurse

## 2023-12-25 ENCOUNTER — Other Ambulatory Visit: Payer: Self-pay

## 2023-12-25 ENCOUNTER — Telehealth: Payer: Self-pay

## 2023-12-25 MED ORDER — OXYCODONE HCL 10 MG PO TABS
10.0000 mg | ORAL_TABLET | Freq: Two times a day (BID) | ORAL | 0 refills | Status: DC | PRN
  Filled 2023-12-25: qty 60, 30d supply, fill #0

## 2023-12-25 NOTE — Telephone Encounter (Signed)
 Morphine  IR is on back order. Patient  will need a replacement. Please advise or prescribe.   Rx is has been cancelled.   Call back ph [phone]

## 2023-12-25 NOTE — Telephone Encounter (Signed)
 PMP was Reviewed MSIR: On Backorder.  Oxycodone  10 mg BID prescribed.  [facility] was called.  [name] is aware of the above and verbalizes understanding.

## 2024-01-07 ENCOUNTER — Other Ambulatory Visit: Payer: Self-pay

## 2024-01-08 ENCOUNTER — Encounter (HOSPITAL_BASED_OUTPATIENT_CLINIC_OR_DEPARTMENT_OTHER): Payer: Self-pay

## 2024-01-08 ENCOUNTER — Other Ambulatory Visit: Payer: Self-pay

## 2024-01-08 ENCOUNTER — Emergency Department (HOSPITAL_BASED_OUTPATIENT_CLINIC_OR_DEPARTMENT_OTHER): Admission: EM | Admit: 2024-01-08 | Discharge: 2024-01-08 | Disposition: A | Discharge status: Home / Self Care

## 2024-01-08 ENCOUNTER — Other Ambulatory Visit: Payer: Self-pay | Admitting: Student

## 2024-01-08 ENCOUNTER — Emergency Department (HOSPITAL_BASED_OUTPATIENT_CLINIC_OR_DEPARTMENT_OTHER)

## 2024-01-08 DIAGNOSIS — R1031 Right lower quadrant pain: Secondary | ICD-10-CM | POA: Insufficient documentation

## 2024-01-08 DIAGNOSIS — D649 Anemia, unspecified: Secondary | ICD-10-CM | POA: Insufficient documentation

## 2024-01-08 DIAGNOSIS — I10 Essential (primary) hypertension: Secondary | ICD-10-CM | POA: Diagnosis not present

## 2024-01-08 DIAGNOSIS — Z7982 Long term (current) use of aspirin: Secondary | ICD-10-CM | POA: Diagnosis not present

## 2024-01-08 DIAGNOSIS — E876 Hypokalemia: Secondary | ICD-10-CM | POA: Insufficient documentation

## 2024-01-08 DIAGNOSIS — Z79899 Other long term (current) drug therapy: Secondary | ICD-10-CM | POA: Insufficient documentation

## 2024-01-08 LAB — URINALYSIS, ROUTINE W REFLEX MICROSCOPIC
Bilirubin Urine: NEGATIVE
Glucose, UA: NEGATIVE mg/dL
Hgb urine dipstick: NEGATIVE
Ketones, ur: NEGATIVE mg/dL
Leukocytes,Ua: NEGATIVE
Nitrite: NEGATIVE
Protein, ur: NEGATIVE mg/dL
Specific Gravity, Urine: 1.024 (ref 1.005–1.030)
pH: 7.5 (ref 5.0–8.0)

## 2024-01-08 LAB — CBC WITH DIFFERENTIAL/PLATELET
Abs Immature Granulocytes: 0.02 K/uL (ref 0.00–0.07)
Basophils Absolute: 0 K/uL (ref 0.0–0.1)
Basophils Relative: 1 %
Eosinophils Absolute: 0.1 K/uL (ref 0.0–0.5)
Eosinophils Relative: 0 %
HCT: 37 % (ref 36.0–46.0)
Hemoglobin: 11.5 g/dL — ABNORMAL LOW (ref 12.0–15.0)
Immature Granulocytes: 0 %
Lymphocytes Relative: 41 %
Lymphs Abs: 2.1 K/uL (ref 0.7–4.0)
MCH: 26.7 pg (ref 26.0–34.0)
MCHC: 31.1 g/dL (ref 30.0–36.0)
MCV: 86 fL (ref 80.0–100.0)
Monocytes Absolute: 0.5 K/uL (ref 0.1–1.0)
Monocytes Relative: 9 %
Neutro Abs: 2.4 K/uL (ref 1.7–7.7)
Neutrophils Relative %: 47 %
Platelets: 259 K/uL (ref 150–400)
RBC: 4.3 MIL/uL (ref 3.87–5.11)
RDW: 13.3 % (ref 11.5–15.5)
WBC: 5.1 K/uL (ref 4.0–10.5)
nRBC: 0 % (ref 0.0–0.2)

## 2024-01-08 LAB — COMPREHENSIVE METABOLIC PANEL WITH GFR
ALT: 19 U/L (ref 0–44)
AST: 28 U/L (ref 15–41)
Albumin: 4.3 g/dL (ref 3.5–5.0)
Alkaline Phosphatase: 63 U/L (ref 38–126)
Anion gap: 11 (ref 5–15)
BUN: 13 mg/dL (ref 8–23)
CO2: 30 mmol/L (ref 22–32)
Calcium: 9.8 mg/dL (ref 8.9–10.3)
Chloride: 101 mmol/L (ref 98–111)
Creatinine, Ser: 1.03 mg/dL — ABNORMAL HIGH (ref 0.44–1.00)
GFR, Estimated: 59 mL/min — ABNORMAL LOW (ref >60)
Glucose, Bld: 101 mg/dL — ABNORMAL HIGH (ref 70–99)
Potassium: 3.3 mmol/L — ABNORMAL LOW (ref 3.5–5.1)
Sodium: 142 mmol/L (ref 135–145)
Total Bilirubin: 1.1 mg/dL (ref 0.0–1.2)
Total Protein: 7.5 g/dL (ref 6.5–8.1)

## 2024-01-08 LAB — LIPASE, BLOOD: Lipase: 27 U/L (ref 11–51)

## 2024-01-08 MED ORDER — IOHEXOL 300 MG/ML  SOLN
100.0000 mL | Freq: Once | INTRAMUSCULAR | Status: AC | PRN
  Administered 2024-01-08: 100 mL via INTRAVENOUS

## 2024-01-08 MED ORDER — FENTANYL CITRATE PF 50 MCG/ML IJ SOSY
50.0000 ug | PREFILLED_SYRINGE | Freq: Once | INTRAMUSCULAR | Status: AC
  Administered 2024-01-08: 50 ug via INTRAVENOUS
  Filled 2024-01-08: qty 1

## 2024-01-08 MED ORDER — METHOCARBAMOL 500 MG PO TABS: 1000.0000 mg | ORAL_TABLET | Freq: Three times a day (TID) | ORAL | 0 refills | Status: DC | PRN

## 2024-01-08 MED ORDER — METHOCARBAMOL 500 MG PO TABS
1000.0000 mg | ORAL_TABLET | Freq: Once | ORAL | Status: AC
  Administered 2024-01-08: 1000 mg via ORAL
  Filled 2024-01-08: qty 2

## 2024-01-08 MED FILL — Lidocaine Patch 5%: CUTANEOUS | 30 days supply | Qty: 30 | Fill #0 | Status: AC

## 2024-01-08 NOTE — ED Triage Notes (Signed)
 Pt c/o burning/ itching, sharp/ burning, started a couple weeks ago, getting worse. Unsure if picking up sheet cake tweaked it, or what. Denies urinary symptoms On morphine , seen at pain clinic. advises stimulator on R side

## 2024-01-08 NOTE — ED Provider Notes (Signed)
 [place] EMERGENCY DEPARTMENT AT [facility] Provider Note   CSN: [id number] Arrival date & time: 01/08/24  1112     Patient presents with: Back Pain and Flank Pain   [name] [name] is a 68 y.o. female with history of spinal stenosis post lumbar laminectomy, lumbar radiculopathy, muscle spasms, Crohn's disease, hypertension, presents with concern for a sharp right lower quadrant abdominal pain that started couple weeks ago, but worse in the past couple of days.  Reports that this area is tender to touch.  She denies any nausea, vomiting, fever, chills.  Denies any dysuria, hematuria, or increased frequency.  Reports normal bowel movements.  She does report picking up a heavy sheet cake right before this pain flared up.    Back Pain Flank Pain       Prior to Admission medications   Medication Sig Start Date End Date Taking? Authorizing Provider  methocarbamol  (ROBAXIN ) 500 MG tablet Take 2 tablets (1,000 mg total) by mouth every 8 (eight) hours as needed for muscle spasms. 01/08/24  Yes Veta Palma, PA-C  aspirin  EC 81 MG tablet Take 1 tablet (81 mg total) by mouth daily. Swallow whole. 04/29/22   Vannie Reche RAMAN, NP  atorvastatin  (LIPITOR ) 80 MG tablet Take 1 tablet (80 mg total) by mouth daily. 12/01/23 11/25/24  Vannie Reche RAMAN, NP  chlorthalidone  (HYGROTON ) 25 MG tablet Take 1 tablet (25 mg total) by mouth daily. 07/19/23   Christia Budds, MD  diclofenac  Sodium (VOLTAREN ) 1 % GEL Apply 1 Application topically 4 (four) times daily as needed. 03/22/23   Christia Budds, MD  escitalopram  (LEXAPRO ) 5 MG tablet Take 1 tablet (5 mg total) by mouth daily. 11/30/23   Christia Budds, MD  fluticasone  (FLONASE ) 50 MCG/ACT nasal spray Place 2 sprays into both nostrils daily. 09/19/23   Christia Budds, MD  lidocaine  (LIDODERM ) 5 % Place 1 patch onto the skin daily. Remove and discard patch within 12 hours or as directed by doctor. 01/08/24   Howell Lunger, DO  losartan   (COZAAR ) 25 MG tablet Take 0.5 tablets (12.5 mg total) by mouth at bedtime. 11/07/23   Christia Budds, MD  metoprolol  succinate (TOPROL -XL) 25 MG 24 hr tablet Take 0.5 tablets (12.5 mg total) by mouth daily. Final attempt. Pt needs yearly appt for any future refills. Please call office at [phone] to schedule appt. 07/20/23   Raford Riggs, MD  morphine  (MS CONTIN ) 15 MG 12 hr tablet Take 1 tablet (15 mg total) by mouth every 8 (eight) hours. 12/15/23   Debby Fidela CROME, NP  morphine  (MSIR) 15 MG tablet Take 1 tablet (15 mg total) by mouth 2 (two) times daily as needed for moderate pain (pain score 4-6). 12/15/23   Debby Fidela CROME, NP  naloxone  (NARCAN ) nasal spray 4 mg/0.1 mL Use as directed for lethargic or respiratory depression after opioid use 01/13/23   Debby Fidela CROME, NP  ondansetron  (ZOFRAN -ODT) 4 MG disintegrating tablet Take 1 tablet (4 mg total) by mouth every 8 (eight) hours as needed for nausea or vomiting. 12/07/23   Christia Budds, MD  Oxycodone  HCl 10 MG TABS Take 1 tablet (10 mg total) by mouth 2 (two) times daily as needed. 12/25/23   Debby Fidela CROME, NP  Semaglutide , 1 MG/DOSE, (OZEMPIC , 1 MG/DOSE,) 4 MG/3ML SOPN Inject 1 mg into the skin once a week. 12/15/23   Christia Budds, MD  tiZANidine  (ZANAFLEX ) 2 MG tablet Take 1 tablet (2 mg total) by mouth 2 (two) times daily as needed  for muscle spasms. 12/15/23   Christia Budds, MD    Allergies: Metformin  and related and Orudis [ketoprofen]    Review of Systems  Genitourinary:  Positive for flank pain.  Musculoskeletal:  Positive for back pain.    Updated Vital Signs BP 138/84   Pulse (!) 51   Temp 98.2 F (36.8 C)   Resp 16   SpO2 99%   Physical Exam Vitals and nursing note reviewed.  Constitutional:      General: She is not in acute distress.    Appearance: She is well-developed.  HENT:     Head: Normocephalic and atraumatic.  Eyes:     Conjunctiva/sclera: Conjunctivae normal.  Cardiovascular:     Rate  and Rhythm: Normal rate and regular rhythm.     Heart sounds: No murmur heard. Pulmonary:     Effort: Pulmonary effort is normal. No respiratory distress.     Breath sounds: Normal breath sounds.  Abdominal:     Palpations: Abdomen is soft.     Tenderness: There is abdominal tenderness. There is no right CVA tenderness or left CVA tenderness.     Comments: Tender in the right lower quadrant with guarding.  Musculoskeletal:        General: No swelling.     Cervical back: Neck supple.  Skin:    General: Skin is warm and dry.     Capillary Refill: Capillary refill takes less than 2 seconds.  Neurological:     Mental Status: She is alert.  Psychiatric:        Mood and Affect: Mood normal.     (all labs ordered are listed, but only abnormal results are displayed) Labs Reviewed  CBC WITH DIFFERENTIAL/PLATELET - Abnormal; Notable for the following components:      Result Value   Hemoglobin 11.5 (*)    All other components within normal limits  COMPREHENSIVE METABOLIC PANEL WITH GFR - Abnormal; Notable for the following components:   Potassium 3.3 (*)    Glucose, Bld 101 (*)    Creatinine, Ser 1.03 (*)    GFR, Estimated 59 (*)    All other components within normal limits  URINALYSIS, ROUTINE W REFLEX MICROSCOPIC  LIPASE, BLOOD  CBC WITH DIFFERENTIAL/PLATELET    EKG: None  Radiology: CT ABDOMEN PELVIS W CONTRAST Result Date: 01/08/2024 CLINICAL DATA:  Right lower quadrant abdominal pain. EXAM: CT ABDOMEN AND PELVIS WITH CONTRAST TECHNIQUE: Multidetector CT imaging of the abdomen and pelvis was performed using the standard protocol following bolus administration of intravenous contrast. RADIATION DOSE REDUCTION: This exam was performed according to the departmental dose-optimization program which includes automated exposure control, adjustment of the mA and/or kV according to patient size and/or use of iterative reconstruction technique. CONTRAST:  OMNIPAQUE  IOHEXOL  300 MG/ML   SOLN COMPARISON:  CT abdomen pelvis dated 03/14/2013. FINDINGS: Lower chest: The visualized lung bases are clear. No intra-abdominal free air or free fluid. The liver is unremarkable. No biliary ductal dilatation. The gallbladder is unremarkable. Pancreas: Unremarkable. No pancreatic ductal dilatation or surrounding inflammatory changes. Spleen: Normal in size without focal abnormality. Adrenals/Urinary Tract: The adrenal glands unremarkable. There is no hydronephrosis on either side. There is symmetric enhancement and excretion of contrast by both kidneys. The visualized ureters and urinary bladder appear unremarkable. Stomach/Bowel: There is no bowel obstruction or active inflammation. The appendix is not visualized with certainty. No inflammatory changes identified in the right lower quadrant. Vascular/Lymphatic: Moderate aortoiliac atherosclerotic disease. The IVC is unremarkable. No portal venous gas.  There is no adenopathy. Reproductive: Hysterectomy.  No suspicious adnexal masses. Other: None Musculoskeletal: Degenerative changes of the spine and lower lumbar laminectomy and posterior fusion. Spinal stimulator noted. No acute osseous pathology. IMPRESSION: 1. No acute intra-abdominal or pelvic pathology. 2.  Aortic Atherosclerosis (ICD10-I70.0). Electronically Signed   By: Vanetta Chou M.D.   On: 01/08/2024 15:40     Procedures   Medications Ordered in the ED  methocarbamol  (ROBAXIN ) tablet 1,000 mg (has no administration in time range)  fentaNYL  (SUBLIMAZE ) injection 50 mcg (50 mcg Intravenous Given 01/08/24 1318)  iohexol  (OMNIPAQUE ) 300 MG/ML solution 100 mL (100 mLs Intravenous Contrast Given 01/08/24 1519)                                    Medical Decision Making Amount and/or Complexity of Data Reviewed Labs: ordered. Radiology: ordered.  Risk Prescription drug management.     Differential diagnosis includes but is not limited to Acute cholecystitis, cholelithiasis,  cholangitis, choledocholithiasis, peptic ulcer, gastritis, gastroenteritis, appendicitis, IBS, IBD, DKA, nephrolithiasis, UTI, pyelonephritis, pancreatitis, diverticulitis, mesenteric ischemia, abdominal aortic aneurysm, small bowel obstruction, volvulus   ED Course:  Upon initial evaluation, patient is well-appearing, no acute distress.  Stable vitals aside from elevated blood pressure 163/94.  Afebrile, not tachycardic. She is tender to the right lower quadrant with guarding.  Labs Ordered: I Ordered, and personally interpreted labs.  The pertinent results include:   Urinalysis without evidence of UTI CBC without leukocytosis. Anemia with hemoglobin 11.5 CMP with elevated creatinine at 1.03, appears at baseline. Hypokalemia at 3.3 Lipase within normal limits  Imaging Studies ordered: I ordered imaging studies including CT abdomen pelvis I independently visualized the imaging with scope of interpretation limited to determining acute life threatening conditions related to emergency care. Imaging showed  No acute abnormality I agree with the radiologist interpretation   Medications Given: Fentanyl , methocarbamol   Upon re-evaluation, patient remains well-appearing with stable vitals.  She still does report some pain in her right lower abdomen.  Given she did not have any significant tenderness to the abdomen upon palpation, normal LFTs, lipase, creatinine, no leukocytosis, and CT scan unremarkable, have low concern for acute intra-abdominal pathology.  She is still having normal bowel movements, no vomiting, low concern for SBO.  Tolerating PO intake. No signs of UTI on urinalysis.  Given this started after lifting a sheet cake, question if this could be musculoskeletal pain. Stable and appropriate for discharge home.     Impression: Right lower abdominal pain  Disposition:  The patient was discharged home with instructions to take Robaxin  as need for pain. She understands she may not  take this with her tizanidine .  Tylenol  as needed for pain.  May use heating packs help with pain.  Follow-up with PCP if symptoms not improving within the next week. Return precautions given.    Record Review: External records from outside source obtained and reviewed including PM&R note where she is being prescribed morphine  and oxycodone      This chart was dictated using voice recognition software, Dragon. Despite the best efforts of this provider to proofread and correct errors, errors may still occur which can change documentation meaning.       Final diagnoses:  Right lower quadrant abdominal pain    ED Discharge Orders          Ordered    methocarbamol  (ROBAXIN ) 500 MG tablet  Every 8 hours PRN  01/08/24 1614               Veta Palma, PA-C 01/08/24 1623    Neysa Caron PARAS, DO 01/09/24 [phone]

## 2024-01-08 NOTE — ED Notes (Signed)
 Reviewed AVS/discharge instruction with patient. Time allotted for and all questions answered. Patient is agreeable for d/c and escorted to ed exit by staff.

## 2024-01-08 NOTE — Discharge Instructions (Addendum)
 Your workup is reassuring today.  You do not have any signs of UTI.  Your kidney, liver, and pancreas labs are normal today.  Your CT scan did not show any abnormalities to explain your pain.  It is possible that this is muscle pain.  You may take up to 1000mg  of tylenol  every 6 hours as needed for pain.  Do not take more then 4g per day.  You may use a heating pack to help with the pain.  You have been prescribed a muscle relaxer called Robaxin  (Methocarbamol ). You may take 2 tablets (1000mg ) every 8 hours as needed for muscle pain. This medication can be sedating. Do not drive or operate heavy machinery after taking this medicine. Do not drink alcohol  or take other sedating medications when taking this medicine for safety reasons.  Keep this out of reach of small children.  Do not take this medication if you are taking tizanidine  (you may take one or the other)  You were found to have a slightly low potassium level on your labs today. Please increase your dietary intake of potassium with foods such as avocados, potatoes, bananas, spinach, salmon. Please have your PCP monitor this value.    Please follow-up with your PCP if pain not improving within the next week.  Return to the ER if you have worsening abdominal pain, vomiting, fevers, any other new or concerning symptoms

## 2024-01-10 ENCOUNTER — Other Ambulatory Visit: Payer: Self-pay

## 2024-01-11 ENCOUNTER — Other Ambulatory Visit: Payer: Self-pay

## 2024-01-11 ENCOUNTER — Other Ambulatory Visit: Payer: Self-pay | Admitting: Student

## 2024-01-11 ENCOUNTER — Encounter: Attending: Physical Medicine & Rehabilitation | Admitting: Registered Nurse

## 2024-01-11 ENCOUNTER — Encounter: Payer: Self-pay | Admitting: Registered Nurse

## 2024-01-11 VITALS — BP 142/77 | HR 70 | Ht 64.0 in | Wt 211.8 lb

## 2024-01-11 DIAGNOSIS — Z5181 Encounter for therapeutic drug level monitoring: Secondary | ICD-10-CM | POA: Insufficient documentation

## 2024-01-11 DIAGNOSIS — M778 Other enthesopathies, not elsewhere classified: Secondary | ICD-10-CM | POA: Diagnosis not present

## 2024-01-11 DIAGNOSIS — M4807 Spinal stenosis, lumbosacral region: Secondary | ICD-10-CM | POA: Diagnosis not present

## 2024-01-11 DIAGNOSIS — Z79891 Long term (current) use of opiate analgesic: Secondary | ICD-10-CM | POA: Insufficient documentation

## 2024-01-11 DIAGNOSIS — G894 Chronic pain syndrome: Secondary | ICD-10-CM | POA: Insufficient documentation

## 2024-01-11 DIAGNOSIS — E119 Type 2 diabetes mellitus without complications: Secondary | ICD-10-CM

## 2024-01-11 MED ORDER — OXYCODONE HCL 10 MG PO TABS
10.0000 mg | ORAL_TABLET | Freq: Two times a day (BID) | ORAL | 0 refills | Status: DC | PRN
  Filled 2024-01-11 – 2024-01-23 (×2): qty 60, 30d supply, fill #0

## 2024-01-11 MED ORDER — MORPHINE SULFATE ER 15 MG PO TBCR
15.0000 mg | EXTENDED_RELEASE_TABLET | Freq: Three times a day (TID) | ORAL | 0 refills | Status: DC
  Filled 2024-01-11: qty 90, 30d supply, fill #0

## 2024-01-11 MED FILL — Semaglutide Soln Pen-inj 1 MG/DOSE (4 MG/3ML): SUBCUTANEOUS | 28 days supply | Qty: 3 | Fill #0 | Status: AC

## 2024-01-11 NOTE — Progress Notes (Signed)
 Subjective:    Patient ID: [name] [name], female    DOB: 07/28/1955, 68 y.o.   MRN: [id number]  HPI: [name] [name] is a 68 y.o. female who returns for follow up appointment for chronic pain and medication refill. She states her pain is located in her right shoulder and lower back pain. She rates her pain 8. Her current exercise regime is walking and performing stretching exercises.  [name] Morphine  equivalent is 75.00 MME.   Last UDS was Performed on 12/15/2023, it was consistent.     Pain Inventory Average Pain 8 Pain Right Now 8 My pain is sharp, burning, and stabbing  In the last 24 hours, has pain interfered with the following? General activity 9 Relation with others 9 Enjoyment of life 9 What TIME of day is your pain at its worst? morning , daytime, evening, and night Sleep (in general) Poor  Pain is worse with: walking, inactivity, and standing Pain improves with: rest, heat/ice, and medication Relief from Meds: 1  Family History  Problem Relation Age of Onset   Hyperlipidemia Mother    Heart attack Mother    Heart disease Mother 33       CHF; CAD   Hypertension Mother    Heart failure Mother    Diabetes Sister    Lupus Sister    Kidney failure Sister    Hyperlipidemia Sister    Hypertension Sister    Stroke Maternal Aunt    Hypertension Brother    Colon cancer Neg Hx    Rectal cancer Neg Hx    Breast cancer Neg Hx    Social History   Socioeconomic History   Marital status: Widowed    Spouse name: Not on file   Number of children: 3   Years of education: Not on file   Highest education level: 9th grade  Occupational History   Occupation: disability    Comment: 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  Tobacco Use   Smoking status: Former    Current packs/day: 0.00    Average packs/day: 0.5 packs/day for 20.0 years (10.0 ttl pk-yrs)    Types: Cigarettes    Start date: 07/21/1988    Quit date: 07/21/2008    Years since quitting: 15.4    Passive exposure: Past    Smokeless tobacco: Never  Vaping Use   Vaping status: Never Used  Substance and Sexual Activity   Alcohol  use: No   Drug use: No   Sexual activity: Not on file  Other Topics Concern   Not on file  Social History Narrative   Marital status: widowed since 2002; not dating which is bad in 2018      Children: 3 children (45, 38, 40); 13 grandchildren; 1 gg      Employment: disability since 2009; retail      Lives: alone in [place] in apartment      Tobacco: quit in 2011      Alcohol : none      Drugs: none      Exercise: minimal in 2018      ADLs: independent with ADLs; drives      Seatbelt: 100%; no texting.   Social Drivers of Health   Financial Resource Strain: Medium Risk (12/14/2023)   Overall Financial Resource Strain (CARDIA)    Difficulty of Paying Living Expenses: Somewhat hard  Food Insecurity: No Food Insecurity (12/14/2023)   Hunger Vital Sign    Worried About Running Out of Food in the Last Year: Never  true    Ran Out of Food in the Last Year: Never true  Transportation Needs: No Transportation Needs (12/14/2023)   PRAPARE - Administrator, Civil Service (Medical): No    Lack of Transportation (Non-Medical): No  Physical Activity: Inactive (12/14/2023)   Exercise Vital Sign    Days of Exercise per Week: 0 days    Minutes of Exercise per Session: Not on file  Stress: Stress Concern Present (12/14/2023)   [employer] of Occupational Health - Occupational Stress Questionnaire    Feeling of Stress: Very much  Social Connections: Moderately Integrated (12/14/2023)   Social Connection and Isolation Panel    Frequency of Communication with Friends and Family: Twice a week    Frequency of Social Gatherings with Friends and Family: Twice a week    Attends Religious Services: More than 4 times per year    Active Member of [employer] or Organizations: Yes    Attends Banker Meetings: More than 4 times per year    Marital Status: Widowed   Past  Surgical History:  Procedure Laterality Date   ABDOMINAL HYSTERECTOMY  1982   DUB; ovaries intact   bladder surg  1992   bladder retraction   BREAST BIOPSY     BREAST REDUCTION SURGERY  1984   CARDIAC CATHETERIZATION  1990's   [name]   COLON SURGERY  05/11/2011   [facility] of adhesions Crohn's   MASS EXCISION Left 01/03/2018   Procedure: EXCISION LEFT UPPER BACK LIPOMA ERAS PATH;  Surgeon: Vanderbilt Ned, MD;  Location: [facility];  Service: General;  Laterality: Left;   REDUCTION MAMMAPLASTY     SPINE SURGERY     5 total (1 upper, 4 lumbar)   SPINE SURGERY     nerve stimulator on right side   wrist and hand Right 07/2021   Dr. Oneil Herald   Past Surgical History:  Procedure Laterality Date   ABDOMINAL HYSTERECTOMY  1982   DUB; ovaries intact   bladder surg  1992   bladder retraction   BREAST BIOPSY     BREAST REDUCTION SURGERY  1984   CARDIAC CATHETERIZATION  1990's   [name]   COLON SURGERY  05/11/2011   [facility] of adhesions Crohn's   MASS EXCISION Left 01/03/2018   Procedure: EXCISION LEFT UPPER BACK LIPOMA ERAS PATH;  Surgeon: Vanderbilt Ned, MD;  Location: [facility];  Service: General;  Laterality: Left;   REDUCTION MAMMAPLASTY     SPINE SURGERY     5 total (1 upper, 4 lumbar)   SPINE SURGERY     nerve stimulator on right side   wrist and hand Right 07/2021   Dr. Oneil Herald   Past Medical History:  Diagnosis Date   Anemia    Blood transfusion without reported diagnosis    Crohn's disease (HCC)    Disorder of sacroiliac joint    Exertional dyspnea 05/28/2021   Hyperlipidemia    Hypertension    Ischemic colitis (HCC)    Lumbago    Lumbar post-laminectomy syndrome    Lumbosacral neuritis    Lumbosacral radiculitis    OSA (obstructive sleep apnea) 05/28/2021   Sciatica    BP (!) 142/77   Pulse 70   Ht 5' 4 (1.626 m)   Wt 211 lb 12.8 oz (96.1 kg)   SpO2 97%   BMI 36.36 kg/m   Opioid Risk Score:   Fall  Risk Score:  `1  Depression screen [facility] 2/9     01/11/2024   10:02 AM 12/15/2023    8:53 AM 12/15/2023    8:45 AM 11/30/2023   10:36 AM 10/26/2023   12:05 PM 10/17/2023    8:22 AM 08/01/2023    8:43 AM  Depression screen PHQ 2/9  Decreased Interest 0 0 0 1 0 0 0  Down, Depressed, Hopeless 0 0 0 0 0 0 0  PHQ - 2 Score 0 0 0 1 0 0 0  Altered sleeping  1 0 1 0    Tired, decreased energy  3 0 2 0    Change in appetite  3 0 3 0    Feeling bad or failure about yourself   0 0 0 0    Trouble concentrating  0 0 0 0    Moving slowly or fidgety/restless  0 0 0 0    Suicidal thoughts  0 0 0 0    PHQ-9 Score  7 0 7 0    Difficult doing work/chores     Not difficult at all       Review of Systems  Musculoskeletal:  Positive for back pain.       Right shoulder   All other systems reviewed and are negative.      Objective:   Physical Exam Vitals and nursing note reviewed.  Constitutional:      Appearance: Normal appearance.  Cardiovascular:     Rate and Rhythm: Normal rate and regular rhythm.     Pulses: Normal pulses.     Heart sounds: Normal heart sounds.  Pulmonary:     Effort: Pulmonary effort is normal.     Breath sounds: Normal breath sounds.  Musculoskeletal:     Comments: Normal Muscle Bulk and Muscle Testing Reveals:  Upper Extremities: Full ROM and Muscle Strength 5/5 Lumbar Paraspinal Tenderness: L-3- L-5  Lower Extremities: Right: Decreased ROM and Muscle Strength 5/5  Right:  Lower Extremity Flexion Produces Pain into her Right Hip  Left Lower Extremity: Full ROM and Muscle Strength 5/5 Arises from Table Slowly Antalgic   Gait     Skin:    General: Skin is warm and dry.  Neurological:     Mental Status: She is alert and oriented to person, place, and time.  Psychiatric:        Mood and Affect: Mood normal.        Behavior: Behavior normal.           Assessment & Plan:  1.Lumbar Postlaminectomy/ Spinal Stenosis Lumbar Region/ Post-Op Pain/Low back pain with  radiating symptoms in L3-4 distribution:  continues to be limited by pain. Refilled:  Oxycodone  10 mg one tablet twice a day as needed for moderate to sever pain #60 and  Morphine  Sulfate ER 15 mg Q 8 hours. 01/11/2024. We will continue the opioid monitoring program, this consists of regular clinic visits, examinations, urine drug screen, pill counts as well as use of [place]  Controlled Substance Reporting system. A 12 month History has been reviewed on the [place]  Controlled Substance Reporting System on 11/15/2023. Encouraged to Continue exercise regime.   2. Lumbar Radiculopathy: Continue Topamax :.Continue to Monitor 01/11/2024 3.Chronic  Right Knee Pain: No complaints today.  Continue with Ice Therapy and  Voltaren  Gel. 01/11/2024 4. Muscle Spasm: Continue current treatment: Tizanidine . 01/11/2024  5. Lipoma: S/P Left  Lipoma Excision of upper back  By Dr. Vanderbilt on 01/03/2018. Surgery Following. Ms. Schenk was instructed to scheduled an appointment  with Dr Vanderbilt, to evaluate Lipoma, she verbalizes understanding. Continue to Monitor.01/11/2024.   6. Paresthesia of Skin: Continue Topamax . Continue to monitor. 01/11/2024 7. Adhesive Capsulitis of Right Shoulder:Right Shoulder Tendonitis Pain: . S/P Right Shoulder Cortisone Injection with Dr Carilyn,  no relief noted. Continue to monitor. Continue HEP as Tolerated. 01/11/2024  8. Polyarthralgia: Continue HEP as Tolerated. Continue to Monitor.01/11/2024  9. Sacral Pain: No complaints today. Continue to Monitor. 01/11/2024 10. Right Hand Pain: No complaints today. S/P :Post right middle finger trigger finger release carpal tunnel release. Dr Barbarann Following.  Continue to monitor. 01/11/2024  11. Left hand Pain : No complaints today. Left Index Trigger Finger: Dr Barbarann following. 07/247/2025   F/U in 1 month

## 2024-01-13 ENCOUNTER — Other Ambulatory Visit: Payer: Self-pay

## 2024-01-14 ENCOUNTER — Other Ambulatory Visit: Payer: Self-pay

## 2024-01-15 ENCOUNTER — Other Ambulatory Visit: Payer: Self-pay | Admitting: Student

## 2024-01-15 ENCOUNTER — Other Ambulatory Visit: Payer: Self-pay

## 2024-01-15 DIAGNOSIS — I10 Essential (primary) hypertension: Secondary | ICD-10-CM

## 2024-01-15 DIAGNOSIS — E119 Type 2 diabetes mellitus without complications: Secondary | ICD-10-CM

## 2024-01-16 ENCOUNTER — Other Ambulatory Visit: Payer: Self-pay

## 2024-01-16 MED FILL — Chlorthalidone Tab 25 MG: ORAL | 90 days supply | Qty: 90 | Fill #0 | Status: AC

## 2024-01-16 MED FILL — Losartan Potassium Tab 25 MG: ORAL | 60 days supply | Qty: 30 | Fill #0 | Status: AC

## 2024-01-19 ENCOUNTER — Ambulatory Visit
Admission: RE | Admit: 2024-01-19 | Discharge: 2024-01-19 | Disposition: A | Discharge status: Home / Self Care | Source: Ambulatory Visit | Attending: Family Medicine | Admitting: Family Medicine

## 2024-01-19 DIAGNOSIS — Z1231 Encounter for screening mammogram for malignant neoplasm of breast: Secondary | ICD-10-CM

## 2024-01-23 ENCOUNTER — Other Ambulatory Visit: Payer: Self-pay

## 2024-02-07 ENCOUNTER — Other Ambulatory Visit: Payer: Self-pay

## 2024-02-07 ENCOUNTER — Telehealth: Payer: Self-pay | Admitting: Registered Nurse

## 2024-02-07 ENCOUNTER — Telehealth: Payer: Self-pay

## 2024-02-07 MED ORDER — MORPHINE SULFATE ER 15 MG PO TBCR
15.0000 mg | EXTENDED_RELEASE_TABLET | Freq: Three times a day (TID) | ORAL | 0 refills | Status: DC
  Filled 2024-02-07 – 2024-02-09 (×2): qty 90, 30d supply, fill #0
  Filled ????-??-??: fill #0

## 2024-02-07 NOTE — Telephone Encounter (Signed)
Taking care of

## 2024-02-07 NOTE — Telephone Encounter (Signed)
 Routing a Mychart message from patient to Dr. Babs to refill in [place] absence.

## 2024-02-07 NOTE — Telephone Encounter (Signed)
 Rx sent. I only refilled the morphine  CR as that's all she referred to on message

## 2024-02-07 NOTE — Telephone Encounter (Signed)
 Patient called for refill on Morphine  ER 15 mg. I called patient back and left a message because there is a prescription that says DNF until 01/16/24 and I wanted to see if she ever picked up that prescription.

## 2024-02-07 NOTE — Telephone Encounter (Signed)
 P returning call to you

## 2024-02-08 ENCOUNTER — Other Ambulatory Visit: Payer: Self-pay

## 2024-02-08 ENCOUNTER — Other Ambulatory Visit: Payer: Self-pay | Admitting: Student

## 2024-02-08 DIAGNOSIS — F320 Major depressive disorder, single episode, mild: Secondary | ICD-10-CM

## 2024-02-08 MED ORDER — LIDOCAINE 5 % EX PTCH
1.0000 | MEDICATED_PATCH | CUTANEOUS | 0 refills | Status: DC
  Filled 2024-02-08: qty 30, 30d supply, fill #0

## 2024-02-08 MED FILL — Escitalopram Oxalate Tab 5 MG (Base Equiv): ORAL | 60 days supply | Qty: 60 | Fill #0 | Status: AC

## 2024-02-08 MED FILL — Semaglutide Soln Pen-inj 1 MG/DOSE (4 MG/3ML): SUBCUTANEOUS | 28 days supply | Qty: 3 | Fill #1 | Status: AC

## 2024-02-09 ENCOUNTER — Other Ambulatory Visit: Payer: Self-pay

## 2024-02-14 DIAGNOSIS — H2513 Age-related nuclear cataract, bilateral: Secondary | ICD-10-CM | POA: Diagnosis not present

## 2024-02-14 DIAGNOSIS — H25013 Cortical age-related cataract, bilateral: Secondary | ICD-10-CM | POA: Diagnosis not present

## 2024-02-14 DIAGNOSIS — E119 Type 2 diabetes mellitus without complications: Secondary | ICD-10-CM | POA: Diagnosis not present

## 2024-02-14 DIAGNOSIS — H43813 Vitreous degeneration, bilateral: Secondary | ICD-10-CM | POA: Diagnosis not present

## 2024-02-14 DIAGNOSIS — H5213 Myopia, bilateral: Secondary | ICD-10-CM | POA: Diagnosis not present

## 2024-02-14 DIAGNOSIS — H33301 Unspecified retinal break, right eye: Secondary | ICD-10-CM | POA: Diagnosis not present

## 2024-02-14 DIAGNOSIS — H52203 Unspecified astigmatism, bilateral: Secondary | ICD-10-CM | POA: Diagnosis not present

## 2024-02-14 DIAGNOSIS — H524 Presbyopia: Secondary | ICD-10-CM | POA: Diagnosis not present

## 2024-02-14 LAB — HM DIABETES EYE EXAM

## 2024-02-20 DIAGNOSIS — Z9689 Presence of other specified functional implants: Secondary | ICD-10-CM | POA: Diagnosis not present

## 2024-02-20 DIAGNOSIS — G894 Chronic pain syndrome: Secondary | ICD-10-CM | POA: Diagnosis not present

## 2024-02-20 NOTE — Progress Notes (Signed)
 Subjective:    Patient ID: [name] [name], female    DOB: Jun 07, 1956, 68 y.o.   MRN: [id number]  HPI: [name] [name] is a 68 y.o. female who returns for follow up appointment for chronic pain and medication refill. She states her pain is located in her neck, right shoulder, upper-lower back. She rates her pain 8. Her current exercise regime is walking and performing stretching exercises.  [name] [name] Morphine  equivalent is  72. .   UDS ordered today.     Pain Inventory Average Pain 8 Pain Right Now 8 My pain is constant, sharp, and aching  In the last 24 hours, has pain interfered with the following? General activity 8 Relation with others 8 Enjoyment of life 8 What TIME of day is your pain at its worst? morning , daytime, evening, and night Sleep (in general) Fair  Pain is worse with: walking and standing Pain improves with: rest, heat/ice, medication, and injections Relief from Meds: 1  Family History  Problem Relation Age of Onset   Hyperlipidemia Mother    Heart attack Mother    Heart disease Mother 75       CHF; CAD   Hypertension Mother    Heart failure Mother    Diabetes Sister    Lupus Sister    Kidney failure Sister    Hyperlipidemia Sister    Hypertension Sister    Stroke Maternal Aunt    Hypertension Brother    Breast cancer Other    Colon cancer Neg Hx    Rectal cancer Neg Hx    Social History   Socioeconomic History   Marital status: Widowed    Spouse name: Not on file   Number of children: 3   Years of education: Not on file   Highest education level: 9th grade  Occupational History   Occupation: disability    Comment: 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  Tobacco Use   Smoking status: Former    Current packs/day: 0.00    Average packs/day: 0.5 packs/day for 20.0 years (10.0 ttl pk-yrs)    Types: Cigarettes    Start date: 07/21/1988    Quit date: 07/21/2008    Years since quitting: 15.5    Passive exposure: Past   Smokeless tobacco: Never   Vaping Use   Vaping status: Never Used  Substance and Sexual Activity   Alcohol  use: No   Drug use: No   Sexual activity: Not on file  Other Topics Concern   Not on file  Social History Narrative   Marital status: widowed since 2002; not dating which is bad in 2018      Children: 3 children (45, 9, 40); 13 grandchildren; 1 gg      Employment: disability since 2009; retail      Lives: alone in [place] in apartment      Tobacco: quit in 2011      Alcohol : none      Drugs: none      Exercise: minimal in 2018      ADLs: independent with ADLs; drives      Seatbelt: 100%; no texting.   Social Drivers of Health   Financial Resource Strain: Medium Risk (12/14/2023)   Overall Financial Resource Strain (CARDIA)    Difficulty of Paying Living Expenses: Somewhat hard  Food Insecurity: No Food Insecurity (12/14/2023)   Hunger Vital Sign    Worried About Running Out of Food in the Last Year: Never true    Ran Out of Food  in the Last Year: Never true  Transportation Needs: No Transportation Needs (12/14/2023)   PRAPARE - Administrator, Civil Service (Medical): No    Lack of Transportation (Non-Medical): No  Physical Activity: Inactive (12/14/2023)   Exercise Vital Sign    Days of Exercise per Week: 0 days    Minutes of Exercise per Session: Not on file  Stress: Stress Concern Present (12/14/2023)   [employer] of Occupational Health - Occupational Stress Questionnaire    Feeling of Stress: Very much  Social Connections: Moderately Integrated (12/14/2023)   Social Connection and Isolation Panel    Frequency of Communication with Friends and Family: Twice a week    Frequency of Social Gatherings with Friends and Family: Twice a week    Attends Religious Services: More than 4 times per year    Active Member of [employer] or Organizations: Yes    Attends Banker Meetings: More than 4 times per year    Marital Status: Widowed   Past Surgical History:  Procedure  Laterality Date   ABDOMINAL HYSTERECTOMY  1982   DUB; ovaries intact   bladder surg  1992   bladder retraction   BREAST BIOPSY     BREAST REDUCTION SURGERY  1984   CARDIAC CATHETERIZATION  1990's   [name]   COLON SURGERY  05/11/2011   [facility] Lysis of adhesions Crohn's   MASS EXCISION Left 01/03/2018   Procedure: EXCISION LEFT UPPER BACK LIPOMA ERAS PATH;  Surgeon: Vanderbilt Ned, MD;  Location: [facility];  Service: General;  Laterality: Left;   REDUCTION MAMMAPLASTY     SPINE SURGERY     5 total (1 upper, 4 lumbar)   SPINE SURGERY     nerve stimulator on right side   wrist and hand Right 07/2021   Dr. Oneil Herald   Past Surgical History:  Procedure Laterality Date   ABDOMINAL HYSTERECTOMY  1982   DUB; ovaries intact   bladder surg  1992   bladder retraction   BREAST BIOPSY     BREAST REDUCTION SURGERY  1984   CARDIAC CATHETERIZATION  1990's   [name]   COLON SURGERY  05/11/2011   [facility] Lysis of adhesions Crohn's   MASS EXCISION Left 01/03/2018   Procedure: EXCISION LEFT UPPER BACK LIPOMA ERAS PATH;  Surgeon: Vanderbilt Ned, MD;  Location: [facility];  Service: General;  Laterality: Left;   REDUCTION MAMMAPLASTY     SPINE SURGERY     5 total (1 upper, 4 lumbar)   SPINE SURGERY     nerve stimulator on right side   wrist and hand Right 07/2021   Dr. Oneil Herald   Past Medical History:  Diagnosis Date   Anemia    Blood transfusion without reported diagnosis    Crohn's disease (HCC)    Disorder of sacroiliac joint    Exertional dyspnea 05/28/2021   Hyperlipidemia    Hypertension    Ischemic colitis (HCC)    Lumbago    Lumbar post-laminectomy syndrome    Lumbosacral neuritis    Lumbosacral radiculitis    OSA (obstructive sleep apnea) 05/28/2021   Sciatica    There were no vitals taken for this visit.  Opioid Risk Score:   Fall Risk Score:  `1  Depression screen [facility] 2/9     01/11/2024   10:02 AM 12/15/2023    8:53 AM  12/15/2023    8:45 AM 11/30/2023   10:36 AM 10/26/2023  12:05 PM 10/17/2023    8:22 AM 08/01/2023    8:43 AM  Depression screen PHQ 2/9  Decreased Interest 0 0 0 1 0 0 0  Down, Depressed, Hopeless 0 0 0 0 0 0 0  PHQ - 2 Score 0 0 0 1 0 0 0  Altered sleeping  1 0 1 0    Tired, decreased energy  3 0 2 0    Change in appetite  3 0 3 0    Feeling bad or failure about yourself   0 0 0 0    Trouble concentrating  0 0 0 0    Moving slowly or fidgety/restless  0 0 0 0    Suicidal thoughts  0 0 0 0    PHQ-9 Score  7 0 7 0    Difficult doing work/chores     Not difficult at all      Review of Systems  Musculoskeletal:  Positive for back pain.       Right shoulder pain,   All other systems reviewed and are negative.      Objective:   Physical Exam Vitals and nursing note reviewed.  Constitutional:      Appearance: Normal appearance.  Cardiovascular:     Rate and Rhythm: Normal rate and regular rhythm.     Pulses: Normal pulses.     Heart sounds: Normal heart sounds.  Pulmonary:     Effort: Pulmonary effort is normal.     Breath sounds: Normal breath sounds.  Musculoskeletal:     Comments: Normal Muscle Bulk and Muscle Testing Reveals:  Upper Extremities: Right: Decreased ROM 90 Degrees  and Muscle Strength 5/5 Right AC Joint Tenderness Left Upper Extremity: Full ROM and Muscle Strength 5/5  Lumbar Paraspinal Tenderness: L-4-L-5 Lower Extremities: Full ROM and Muscle Strength 5/5 Arises from Table slowly Antalgic  Gait     Skin:    General: Skin is warm and dry.  Neurological:     Mental Status: She is alert and oriented to person, place, and time.  Psychiatric:        Mood and Affect: Mood normal.        Behavior: Behavior normal.          Assessment & Plan:  1.Lumbar Postlaminectomy/ Spinal Stenosis Lumbar Region/ Post-Op Pain/Low back pain with radiating symptoms in L3-4 distribution:  continues to be limited by pain. Refilled:  Oxycodone  10 mg one tablet twice a day  as needed for moderate to sever pain #60 and  Morphine  Sulfate ER 15 mg Q 8 hours. 02/20/2024. We will continue the opioid monitoring program, this consists of regular clinic visits, examinations, urine drug screen, pill counts as well as use of [place]  Controlled Substance Reporting system. A 12 month History has been reviewed on the [place]  Controlled Substance Reporting System on 11/15/2023. Encouraged to Continue exercise regime.   2. Lumbar Radiculopathy: Continue Topamax :.Continue to Monitor 02/20/2024 3.Chronic  Right Knee Pain: No complaints today.  Continue with Ice Therapy and  Voltaren  Gel. 02/20/2024 4. Muscle Spasm: Continue current treatment: Tizanidine . 02/20/2024  5. Lipoma: S/P Left  Lipoma Excision of upper back  By Dr. Vanderbilt on 01/03/2018. Surgery Following. Ms. Constantino has a  scheduled an appointment with Dr Vanderbilt today , to evaluate Lipoma,. Continue to Monitor.02/20/2024.   6. Paresthesia of Skin: Continue Topamax . Continue to monitor. 02/20/2024 7. Adhesive Capsulitis of Right Shoulder:Right Shoulder Tendonitis Pain: . S/P Right Shoulder Cortisone Injection with Dr Carilyn,  no relief noted. Continue to monitor. Continue HEP as Tolerated. 02/20/2024  8. Polyarthralgia: Continue HEP as Tolerated. Continue to Monitor.02/20/2024  9. Sacral Pain: No complaints today. Continue to Monitor. 02/20/2024 10. Right Hand Pain: No complaints today. S/P :Post right middle finger trigger finger release carpal tunnel release. Dr Barbarann Following.  Continue to monitor. 02/20/2024  11. Left hand Pain : No complaints today. Left Index Trigger Finger: Dr Barbarann following. 02/20/2024   F/U in 1 month

## 2024-02-21 ENCOUNTER — Encounter: Payer: Self-pay | Admitting: Registered Nurse

## 2024-02-21 ENCOUNTER — Other Ambulatory Visit: Payer: Self-pay

## 2024-02-21 ENCOUNTER — Encounter: Attending: Physical Medicine & Rehabilitation | Admitting: Registered Nurse

## 2024-02-21 ENCOUNTER — Ambulatory Visit: Admitting: Student

## 2024-02-21 VITALS — BP 132/71 | HR 92 | Ht 64.0 in | Wt 206.0 lb

## 2024-02-21 DIAGNOSIS — M4807 Spinal stenosis, lumbosacral region: Secondary | ICD-10-CM | POA: Diagnosis not present

## 2024-02-21 DIAGNOSIS — G894 Chronic pain syndrome: Secondary | ICD-10-CM | POA: Insufficient documentation

## 2024-02-21 DIAGNOSIS — H35372 Puckering of macula, left eye: Secondary | ICD-10-CM | POA: Diagnosis not present

## 2024-02-21 DIAGNOSIS — Z5181 Encounter for therapeutic drug level monitoring: Secondary | ICD-10-CM | POA: Insufficient documentation

## 2024-02-21 DIAGNOSIS — Z79891 Long term (current) use of opiate analgesic: Secondary | ICD-10-CM | POA: Diagnosis not present

## 2024-02-21 DIAGNOSIS — M778 Other enthesopathies, not elsewhere classified: Secondary | ICD-10-CM | POA: Insufficient documentation

## 2024-02-21 DIAGNOSIS — M546 Pain in thoracic spine: Secondary | ICD-10-CM | POA: Insufficient documentation

## 2024-02-21 DIAGNOSIS — H33312 Horseshoe tear of retina without detachment, left eye: Secondary | ICD-10-CM | POA: Diagnosis not present

## 2024-02-21 DIAGNOSIS — M542 Cervicalgia: Secondary | ICD-10-CM | POA: Diagnosis not present

## 2024-02-21 DIAGNOSIS — G8929 Other chronic pain: Secondary | ICD-10-CM | POA: Diagnosis not present

## 2024-02-21 DIAGNOSIS — H2513 Age-related nuclear cataract, bilateral: Secondary | ICD-10-CM | POA: Diagnosis not present

## 2024-02-21 DIAGNOSIS — H33321 Round hole, right eye: Secondary | ICD-10-CM | POA: Diagnosis not present

## 2024-02-21 DIAGNOSIS — H35033 Hypertensive retinopathy, bilateral: Secondary | ICD-10-CM | POA: Diagnosis not present

## 2024-02-21 MED ORDER — MORPHINE SULFATE ER 15 MG PO TBCR
15.0000 mg | EXTENDED_RELEASE_TABLET | Freq: Three times a day (TID) | ORAL | 0 refills | Status: DC
  Filled 2024-02-21 – 2024-03-07 (×2): qty 90, 30d supply, fill #0
  Filled 2024-03-08: qty 80, 26d supply, fill #0
  Filled 2024-03-08: qty 10, 4d supply, fill #0

## 2024-02-21 MED ORDER — OXYCODONE HCL 10 MG PO TABS
10.0000 mg | ORAL_TABLET | Freq: Two times a day (BID) | ORAL | 0 refills | Status: DC | PRN
  Filled 2024-02-21 (×2): qty 60, 30d supply, fill #0

## 2024-02-22 ENCOUNTER — Ambulatory Visit (INDEPENDENT_AMBULATORY_CARE_PROVIDER_SITE_OTHER): Admitting: Student

## 2024-02-22 ENCOUNTER — Other Ambulatory Visit: Payer: Self-pay

## 2024-02-22 ENCOUNTER — Encounter: Payer: Self-pay | Admitting: Student

## 2024-02-22 VITALS — BP 112/62 | HR 69 | Ht 64.0 in | Wt 206.8 lb

## 2024-02-22 DIAGNOSIS — D171 Benign lipomatous neoplasm of skin and subcutaneous tissue of trunk: Secondary | ICD-10-CM

## 2024-02-22 NOTE — Patient Instructions (Signed)
 It was great to see you! Thank you for allowing me to participate in your care!   I recommend that you always bring your medications to each appointment as this makes it easy to ensure we are on the correct medications and helps us  not miss when refills are needed.  Our plans for today:  - I have sent a referral to surgery, they should call to schedule you -I will also send a message to Dr. Vanderbilt, to determine what imaging he is requesting.  Take care and seek immediate care sooner if you develop any concerns. Please remember to show up 15 minutes before your scheduled appointment time!  Gladis Church, DO [facility]

## 2024-02-22 NOTE — Progress Notes (Signed)
    SUBJECTIVE:   CHIEF COMPLAINT / HPI:   Mass over L trapezius Patient has history of lipoma in the same location that was excised by Dr. Vanderbilt in 2019.  At that time, the mass was painful and limiting range of motion of her neck.  She reports that the lipoma has returned, is painful and again limiting range of motion of her neck.  She reports that she spoke with Dr. Milta office who recommend a new referral and imaging studies (concern for spinal attachment?).  PERTINENT  PMH / PSH: Postlaminectomy lumbar/cervical region, presence of spinal cord stimulator  OBJECTIVE:   BP 112/62   Pulse 69   Ht 5' 4 (1.626 m)   Wt 206 lb 12.8 oz (93.8 kg)   SpO2 97%   BMI 35.50 kg/m    General: NAD, pleasant Cardio: RRR, no MRG. Cap Refill <2s. Respiratory: CTAB, normal wob on RA Skin: Approximate 5.5 x 5.5 cm, mobile, nonfluctuant, mass over left trapezius   ASSESSMENT/PLAN:   Assessment & Plan Lipoma of torso - Referral to general surgery - Defer ultrasound at this time, inquired with general surgery about need for MRI   [name] Church, DO [facility]

## 2024-02-23 LAB — TOXASSURE SELECT,+ANTIDEPR,UR

## 2024-03-01 DIAGNOSIS — H33321 Round hole, right eye: Secondary | ICD-10-CM | POA: Diagnosis not present

## 2024-03-04 MED FILL — Semaglutide Soln Pen-inj 1 MG/DOSE (4 MG/3ML): SUBCUTANEOUS | 28 days supply | Qty: 3 | Fill #2 | Status: AC

## 2024-03-04 MED FILL — Atorvastatin Calcium Tab 80 MG (Base Equivalent): ORAL | 90 days supply | Qty: 90 | Fill #1 | Status: AC

## 2024-03-07 ENCOUNTER — Other Ambulatory Visit: Payer: Self-pay | Admitting: Student

## 2024-03-07 ENCOUNTER — Other Ambulatory Visit: Payer: Self-pay

## 2024-03-07 MED FILL — Lidocaine Patch 5%: CUTANEOUS | 30 days supply | Qty: 30 | Fill #0 | Status: AC

## 2024-03-07 MED FILL — Ondansetron Orally Disintegrating Tab 4 MG: ORAL | 7 days supply | Qty: 20 | Fill #0 | Status: AC

## 2024-03-08 ENCOUNTER — Other Ambulatory Visit: Payer: Self-pay

## 2024-03-11 ENCOUNTER — Other Ambulatory Visit: Payer: Self-pay | Admitting: Student

## 2024-03-11 ENCOUNTER — Other Ambulatory Visit: Payer: Self-pay

## 2024-03-11 DIAGNOSIS — E119 Type 2 diabetes mellitus without complications: Secondary | ICD-10-CM

## 2024-03-11 MED ORDER — LOSARTAN POTASSIUM 25 MG PO TABS
12.5000 mg | ORAL_TABLET | Freq: Every day | ORAL | 0 refills | Status: DC
  Filled 2024-03-11: qty 30, 60d supply, fill #0

## 2024-03-18 DIAGNOSIS — H33312 Horseshoe tear of retina without detachment, left eye: Secondary | ICD-10-CM | POA: Diagnosis not present

## 2024-03-19 ENCOUNTER — Other Ambulatory Visit (HOSPITAL_BASED_OUTPATIENT_CLINIC_OR_DEPARTMENT_OTHER): Payer: Self-pay | Admitting: Cardiovascular Disease

## 2024-03-20 ENCOUNTER — Other Ambulatory Visit: Payer: Self-pay

## 2024-03-20 ENCOUNTER — Encounter: Attending: Physical Medicine & Rehabilitation | Admitting: Registered Nurse

## 2024-03-20 ENCOUNTER — Encounter: Payer: Self-pay | Admitting: Registered Nurse

## 2024-03-20 VITALS — Ht 64.0 in | Wt 212.0 lb

## 2024-03-20 DIAGNOSIS — Z5181 Encounter for therapeutic drug level monitoring: Secondary | ICD-10-CM | POA: Insufficient documentation

## 2024-03-20 DIAGNOSIS — G894 Chronic pain syndrome: Secondary | ICD-10-CM | POA: Insufficient documentation

## 2024-03-20 DIAGNOSIS — Z79891 Long term (current) use of opiate analgesic: Secondary | ICD-10-CM | POA: Insufficient documentation

## 2024-03-20 DIAGNOSIS — M7581 Other shoulder lesions, right shoulder: Secondary | ICD-10-CM | POA: Insufficient documentation

## 2024-03-20 DIAGNOSIS — M4807 Spinal stenosis, lumbosacral region: Secondary | ICD-10-CM | POA: Diagnosis not present

## 2024-03-20 MED ORDER — MORPHINE SULFATE ER 15 MG PO TBCR: 15.0000 mg | EXTENDED_RELEASE_TABLET | Freq: Three times a day (TID) | ORAL | 0 refills | Status: DC

## 2024-03-20 MED ORDER — METOPROLOL SUCCINATE ER 25 MG PO TB24
12.5000 mg | ORAL_TABLET | Freq: Every day | ORAL | 2 refills | Status: AC
  Filled 2024-03-20 – 2024-04-12 (×3): qty 45, 90d supply, fill #0
  Filled 2024-07-08: qty 45, 90d supply, fill #1

## 2024-03-20 MED ORDER — OXYCODONE HCL 10 MG PO TABS: 10.0000 mg | ORAL_TABLET | Freq: Two times a day (BID) | ORAL | 0 refills | Status: DC | PRN

## 2024-03-20 NOTE — Progress Notes (Signed)
 Subjective:    Patient ID: [name] [name], female    DOB: [birth year], 68 y.o.   MRN: [id number]  HPI: [name] [name] is a 68 y.o. female whose appointment was changed to Virtual Visit she called office reporting she has diarrhea and abdominal cramping, her appointment was changed to virtual visit.  I connected with [name] [name]  and verified that I am speaking with the correct person using two identifiers.  Location: Patient: in her home Provider: in the office    I discussed the limitations, risks, security and privacy concerns of performing an evaluation and management service by telephone and the availability of in person appointments. I also discussed with the patient that there may be a patient responsible charge related to this service. The patient expressed understanding and agreed to proceed.  She states her pain is located in her lower back. She rates her pain 8. Her current exercise regime is walking and performing stretching exercises.  [name] [name]  equivalent is 75.00 MME.   Last UDS was performed on 02/21/2024, it was consistent.      Pain Inventory Average Pain 8 Pain Right Now 8 My pain is constant, sharp, and aching  In the last 24 hours, has pain interfered with the following? General activity 9 Relation with others 9 Enjoyment of life 9 What TIME of day is your pain at its worst? morning  and night Sleep (in general) Fair  Pain is worse with: walking, bending, sitting, inactivity, standing, and some activites Pain improves with: rest, heat/ice, medication, and TENS Relief from Meds: 2  Family History  Problem Relation Age of Onset   Hyperlipidemia Mother    Heart attack Mother    Heart disease Mother 23       CHF; CAD   Hypertension Mother    Heart failure Mother    Diabetes Sister    Lupus Sister    Kidney failure Sister    Hyperlipidemia Sister    Hypertension Sister    Stroke Maternal Aunt    Hypertension Brother    Breast  cancer Other    Colon cancer Neg Hx    Rectal cancer Neg Hx    Social History   Socioeconomic History   Marital status: Widowed    Spouse name: Not on file   Number of children: 3   Years of education: Not on file   Highest education level: 9th grade  Occupational History   Occupation: disability    Comment: 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  Tobacco Use   Smoking status: Former    Current packs/day: 0.00    Average packs/day: 0.5 packs/day for 20.0 years (10.0 ttl pk-yrs)    Types: Cigarettes    Start date: 07/21/1988    Quit date: 07/21/2008    Years since quitting: 15.6    Passive exposure: Past   Smokeless tobacco: Never  Vaping Use   Vaping status: Never Used  Substance and Sexual Activity   Alcohol  use: No   Drug use: No   Sexual activity: Not on file  Other Topics Concern   Not on file  Social History Narrative   Marital status: widowed since 2002; not dating which is bad in 2018      Children: 3 children (45, 21, 40); 13 grandchildren; 1 gg      Employment: disability since 2009; retail      Lives: alone in [place] in apartment      Tobacco: quit in 2011  Alcohol : none      Drugs: none      Exercise: minimal in 2018      ADLs: independent with ADLs; drives      Seatbelt: 100%; no texting.   Social Drivers of Health   Financial Resource Strain: Medium Risk (12/14/2023)   Overall Financial Resource Strain (CARDIA)    Difficulty of Paying Living Expenses: Somewhat hard  Food Insecurity: No Food Insecurity (12/14/2023)   Hunger Vital Sign    Worried About Running Out of Food in the Last Year: Never true    Ran Out of Food in the Last Year: Never true  Transportation Needs: No Transportation Needs (12/14/2023)   PRAPARE - Administrator, Civil Service (Medical): No    Lack of Transportation (Non-Medical): No  Physical Activity: Inactive (12/14/2023)   Exercise Vital Sign    Days of Exercise per Week: 0 days    Minutes of Exercise per Session: Not on file   Stress: Stress Concern Present (12/14/2023)   [employer] of Occupational Health - Occupational Stress Questionnaire    Feeling of Stress: Very much  Social Connections: Moderately Integrated (12/14/2023)   Social Connection and Isolation Panel    Frequency of Communication with Friends and Family: Twice a week    Frequency of Social Gatherings with Friends and Family: Twice a week    Attends Religious Services: More than 4 times per year    Active Member of [employer] or Organizations: Yes    Attends Banker Meetings: More than 4 times per year    Marital Status: Widowed   Past Surgical History:  Procedure Laterality Date   ABDOMINAL HYSTERECTOMY  1982   DUB; ovaries intact   bladder surg  1992   bladder retraction   BREAST BIOPSY     BREAST REDUCTION SURGERY  1984   CARDIAC CATHETERIZATION  1990's   [name]   COLON SURGERY  05/11/2011   [facility] Lysis of adhesions Crohn's   MASS EXCISION Left 01/03/2018   Procedure: EXCISION LEFT UPPER BACK LIPOMA ERAS PATH;  Surgeon: Vanderbilt Ned, MD;  Location: [facility];  Service: General;  Laterality: Left;   REDUCTION MAMMAPLASTY     SPINE SURGERY     5 total (1 upper, 4 lumbar)   SPINE SURGERY     nerve stimulator on right side   wrist and hand Right 07/2021   Dr. Oneil Herald   Past Surgical History:  Procedure Laterality Date   ABDOMINAL HYSTERECTOMY  1982   DUB; ovaries intact   bladder surg  1992   bladder retraction   BREAST BIOPSY     BREAST REDUCTION SURGERY  1984   CARDIAC CATHETERIZATION  1990's   [name]   COLON SURGERY  05/11/2011   [facility] Lysis of adhesions Crohn's   MASS EXCISION Left 01/03/2018   Procedure: EXCISION LEFT UPPER BACK LIPOMA ERAS PATH;  Surgeon: Vanderbilt Ned, MD;  Location: [facility];  Service: General;  Laterality: Left;   REDUCTION MAMMAPLASTY     SPINE SURGERY     5 total (1 upper, 4 lumbar)   SPINE SURGERY     nerve stimulator on right side    wrist and hand Right 07/2021   Dr. Oneil Herald   Past Medical History:  Diagnosis Date   Anemia    Blood transfusion without reported diagnosis    Crohn's disease (HCC)    Disorder of sacroiliac joint    Exertional  dyspnea 05/28/2021   Hyperlipidemia    Hypertension    Ischemic colitis    Lumbago    Lumbar post-laminectomy syndrome    Lumbosacral neuritis    Lumbosacral radiculitis    OSA (obstructive sleep apnea) 05/28/2021   Sciatica    There were no vitals taken for this visit.  Opioid Risk Score:   Fall Risk Score:  `1  Depression screen [facility] 2/9     02/22/2024    8:22 AM 02/21/2024    9:14 AM 01/11/2024   10:02 AM 12/15/2023    8:53 AM 12/15/2023    8:45 AM 11/30/2023   10:36 AM 10/26/2023   12:05 PM  Depression screen PHQ 2/9  Decreased Interest 0 0 0 0 0 1 0  Down, Depressed, Hopeless 0 0 0 0 0 0 0  PHQ - 2 Score 0 0 0 0 0 1 0  Altered sleeping 0   1 0 1 0  Tired, decreased energy 1   3 0 2 0  Change in appetite 1   3 0 3 0  Feeling bad or failure about yourself  0   0 0 0 0  Trouble concentrating 0   0 0 0 0  Moving slowly or fidgety/restless 0   0 0 0 0  Suicidal thoughts 0   0 0 0 0  PHQ-9 Score 2   7 0 7 0  Difficult doing work/chores Not difficult at all      Not difficult at all    Review of Systems  Musculoskeletal:  Positive for back pain.       Right shoulder  All other systems reviewed and are negative.      Objective:   Physical Exam Vitals and nursing note reviewed.  Musculoskeletal:     Comments: Virtual Visit          Assessment & Plan:  1.Lumbar Postlaminectomy/ Spinal Stenosis Lumbar Region/ Post-Op Pain/Low back pain with radiating symptoms in L3-4 distribution:  continues to be limited by pain. Refilled:  Oxycodone  10 mg one tablet twice a day as needed for moderate to sever pain #60 and  [name]  Sulfate ER 15 mg Q 8 hours. 03/20/2024. We will continue the opioid monitoring program, this consists of regular clinic visits,  examinations, urine drug screen, pill counts as well as use of [place]  Controlled Substance Reporting system. A 12 month History has been reviewed on the [place]  Controlled Substance Reporting System on 11/15/2023. Encouraged to Continue exercise regime.   2. Lumbar Radiculopathy: Continue Topamax :.Continue to Monitor 03/20/2024 3.Chronic  Right Knee Pain: No complaints today.  Continue with Ice Therapy and  Voltaren  Gel. 03/20/2024 4. Muscle Spasm: Continue current treatment: Tizanidine . 03/20/2024  5. Lipoma: S/P Left  Lipoma Excision of upper back  By Dr. Vanderbilt on 01/03/2018. Surgery Following. Ms. Sobecki has a  scheduled an appointment with Dr Vanderbilt today , to evaluate Lipoma,. Continue to Monitor.03/20/2024.   6. Paresthesia of Skin: Continue Topamax . Continue to monitor. 03/20/2024 7. Adhesive Capsulitis of Right Shoulder:Right Shoulder Tendonitis Pain: . S/P Right Shoulder Cortisone Injection with Dr Carilyn,  no relief noted. Continue to monitor. Continue HEP as Tolerated. 03/20/2024  8. Polyarthralgia: Continue HEP as Tolerated. Continue to Monitor.03/20/2024  9. Sacral Pain: No complaints today. Continue to Monitor. 03/20/2024 10. Right Hand Pain: No complaints today. S/P :Post right middle finger trigger finger release carpal tunnel release. Dr Barbarann Following.  Continue to monitor. 03/20/2024  11. Left hand Pain : No  complaints today. Left Index Trigger Finger: Dr Barbarann following. 03/20/2024   F/U in 1 month  Virtual Visit Established Patient  Location of Patient: In Her Home  Location of Provider: In the Office

## 2024-03-26 ENCOUNTER — Other Ambulatory Visit: Payer: Self-pay

## 2024-03-28 ENCOUNTER — Other Ambulatory Visit: Payer: Self-pay | Admitting: Student

## 2024-03-28 ENCOUNTER — Other Ambulatory Visit: Payer: Self-pay

## 2024-03-28 DIAGNOSIS — J301 Allergic rhinitis due to pollen: Secondary | ICD-10-CM

## 2024-03-28 MED FILL — Fluticasone Propionate Nasal Susp 50 MCG/ACT: NASAL | 90 days supply | Qty: 48 | Fill #0 | Status: CN

## 2024-04-01 ENCOUNTER — Other Ambulatory Visit: Payer: Self-pay

## 2024-04-02 ENCOUNTER — Other Ambulatory Visit: Payer: Self-pay

## 2024-04-02 ENCOUNTER — Other Ambulatory Visit: Payer: Self-pay | Admitting: Student

## 2024-04-02 DIAGNOSIS — E119 Type 2 diabetes mellitus without complications: Secondary | ICD-10-CM

## 2024-04-02 DIAGNOSIS — F320 Major depressive disorder, single episode, mild: Secondary | ICD-10-CM

## 2024-04-02 MED FILL — Semaglutide Soln Pen-inj 1 MG/DOSE (4 MG/3ML): SUBCUTANEOUS | 28 days supply | Qty: 3 | Fill #0 | Status: CN

## 2024-04-02 MED FILL — Escitalopram Oxalate Tab 5 MG (Base Equiv): ORAL | 60 days supply | Qty: 60 | Fill #0 | Status: CN

## 2024-04-03 ENCOUNTER — Other Ambulatory Visit: Payer: Self-pay

## 2024-04-03 ENCOUNTER — Telehealth: Payer: Self-pay

## 2024-04-03 NOTE — Telephone Encounter (Signed)
 [name] [name] called to report: She will be having surgery on next Monday, 04/08/2024.  Dr. Ozell Bertin will be replacing  her right eye lens.  For more details please call [name] on phone number [phone].

## 2024-04-08 DIAGNOSIS — H25011 Cortical age-related cataract, right eye: Secondary | ICD-10-CM | POA: Diagnosis not present

## 2024-04-08 DIAGNOSIS — H2511 Age-related nuclear cataract, right eye: Secondary | ICD-10-CM | POA: Diagnosis not present

## 2024-04-08 DIAGNOSIS — H25811 Combined forms of age-related cataract, right eye: Secondary | ICD-10-CM | POA: Diagnosis not present

## 2024-04-09 ENCOUNTER — Other Ambulatory Visit: Payer: Self-pay

## 2024-04-09 MED FILL — Fluticasone Propionate Nasal Susp 50 MCG/ACT: NASAL | 90 days supply | Qty: 48 | Fill #0 | Status: AC

## 2024-04-11 ENCOUNTER — Other Ambulatory Visit: Payer: Self-pay

## 2024-04-11 ENCOUNTER — Other Ambulatory Visit: Payer: Self-pay | Admitting: Student

## 2024-04-11 MED ORDER — LIDOCAINE 5 % EX PTCH
1.0000 | MEDICATED_PATCH | CUTANEOUS | 0 refills | Status: DC
  Filled 2024-04-11: qty 30, 30d supply, fill #0

## 2024-04-12 ENCOUNTER — Other Ambulatory Visit: Payer: Self-pay

## 2024-04-12 MED FILL — Escitalopram Oxalate Tab 5 MG (Base Equiv): ORAL | 60 days supply | Qty: 60 | Fill #0 | Status: AC

## 2024-04-12 MED FILL — Semaglutide Soln Pen-inj 1 MG/DOSE (4 MG/3ML): SUBCUTANEOUS | 28 days supply | Qty: 3 | Fill #0 | Status: AC

## 2024-04-17 ENCOUNTER — Encounter (HOSPITAL_BASED_OUTPATIENT_CLINIC_OR_DEPARTMENT_OTHER): Admitting: Registered Nurse

## 2024-04-17 VITALS — BP 123/80 | HR 62 | Ht 64.0 in | Wt 209.0 lb

## 2024-04-17 DIAGNOSIS — G894 Chronic pain syndrome: Secondary | ICD-10-CM | POA: Diagnosis not present

## 2024-04-17 DIAGNOSIS — Z79891 Long term (current) use of opiate analgesic: Secondary | ICD-10-CM | POA: Diagnosis not present

## 2024-04-17 DIAGNOSIS — M4807 Spinal stenosis, lumbosacral region: Secondary | ICD-10-CM

## 2024-04-17 DIAGNOSIS — M7581 Other shoulder lesions, right shoulder: Secondary | ICD-10-CM

## 2024-04-17 DIAGNOSIS — Z5181 Encounter for therapeutic drug level monitoring: Secondary | ICD-10-CM

## 2024-04-17 MED ORDER — OXYCODONE HCL 10 MG PO TABS: 10.0000 mg | ORAL_TABLET | Freq: Two times a day (BID) | ORAL | 0 refills | Status: DC | PRN

## 2024-04-17 MED ORDER — MORPHINE SULFATE ER 15 MG PO TBCR: 15.0000 mg | EXTENDED_RELEASE_TABLET | Freq: Three times a day (TID) | ORAL | 0 refills | Status: DC

## 2024-04-17 NOTE — Progress Notes (Signed)
 Subjective:    Patient ID: [name] [name], female    DOB: [birth year], 68 y.o.   MRN: [id number]  HPI:  [name] [name] is a 68 y.o. female who returns for follow up appointment for chronic pain and medication refill. She states her pain is located in right shoulder and lower back. She rates her pain 8. Her current exercise regime is walking and performing stretching exercises.  [name]  equivalent is 75.00 MME.   Last UDS was Performed on 02/21/2024, it was consistent.     Pain Inventory Average Pain 8 Pain Right Now 8 My pain is constant, sharp, and aching  In the last 24 hours, has pain interfered with the following? General activity 8 Relation with others 8 Enjoyment of life 8 What TIME of day is your pain at its worst? morning , daytime, and night Sleep (in general) Fair  Pain is worse with: walking, inactivity, and standing Pain improves with: rest, heat/ice, and medication Relief from Meds: 1  Family History  Problem Relation Age of Onset   Hyperlipidemia Mother    Heart attack Mother    Heart disease Mother 25       CHF; CAD   Hypertension Mother    Heart failure Mother    Diabetes Sister    Lupus Sister    Kidney failure Sister    Hyperlipidemia Sister    Hypertension Sister    Stroke Maternal Aunt    Hypertension Brother    Breast cancer Other    Colon cancer Neg Hx    Rectal cancer Neg Hx    Social History   Socioeconomic History   Marital status: Widowed    Spouse name: Not on file   Number of children: 3   Years of education: Not on file   Highest education level: 9th grade  Occupational History   Occupation: disability    Comment: 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  Tobacco Use   Smoking status: Former    Current packs/day: 0.00    Average packs/day: 0.5 packs/day for 20.0 years (10.0 ttl pk-yrs)    Types: Cigarettes    Start date: 07/21/1988    Quit date: 07/21/2008    Years since quitting: 15.7    Passive exposure: Past   Smokeless  tobacco: Never  Vaping Use   Vaping status: Never Used  Substance and Sexual Activity   Alcohol  use: No   Drug use: No   Sexual activity: Not on file  Other Topics Concern   Not on file  Social History Narrative   Marital status: widowed since 2002; not dating which is bad in 2018      Children: 3 children (45, 44, 40); 13 grandchildren; 1 gg      Employment: disability since 2009; retail      Lives: alone in [place] in apartment      Tobacco: quit in 2011      Alcohol : none      Drugs: none      Exercise: minimal in 2018      ADLs: independent with ADLs; drives      Seatbelt: 100%; no texting.   Social Drivers of Health   Financial Resource Strain: Medium Risk (12/14/2023)   Overall Financial Resource Strain (CARDIA)    Difficulty of Paying Living Expenses: Somewhat hard  Food Insecurity: No Food Insecurity (12/14/2023)   Hunger Vital Sign    Worried About Running Out of Food in the Last Year: Never true  Ran Out of Food in the Last Year: Never true  Transportation Needs: No Transportation Needs (12/14/2023)   PRAPARE - Administrator, Civil Service (Medical): No    Lack of Transportation (Non-Medical): No  Physical Activity: Inactive (12/14/2023)   Exercise Vital Sign    Days of Exercise per Week: 0 days    Minutes of Exercise per Session: Not on file  Stress: Stress Concern Present (12/14/2023)   [employer] of Occupational Health - Occupational Stress Questionnaire    Feeling of Stress: Very much  Social Connections: Moderately Integrated (12/14/2023)   Social Connection and Isolation Panel    Frequency of Communication with Friends and Family: Twice a week    Frequency of Social Gatherings with Friends and Family: Twice a week    Attends Religious Services: More than 4 times per year    Active Member of [employer] or Organizations: Yes    Attends Banker Meetings: More than 4 times per year    Marital Status: Widowed   Past Surgical  History:  Procedure Laterality Date   ABDOMINAL HYSTERECTOMY  1982   DUB; ovaries intact   bladder surg  1992   bladder retraction   BREAST BIOPSY     BREAST REDUCTION SURGERY  1984   CARDIAC CATHETERIZATION  1990's   [name]   COLON SURGERY  05/11/2011   [facility] of adhesions Crohn's   MASS EXCISION Left 01/03/2018   Procedure: EXCISION LEFT UPPER BACK LIPOMA ERAS PATH;  Surgeon: Vanderbilt Ned, MD;  Location: [facility];  Service: General;  Laterality: Left;   REDUCTION MAMMAPLASTY     SPINE SURGERY     5 total (1 upper, 4 lumbar)   SPINE SURGERY     nerve stimulator on right side   wrist and hand Right 07/2021   Dr. Oneil Herald   Past Surgical History:  Procedure Laterality Date   ABDOMINAL HYSTERECTOMY  1982   DUB; ovaries intact   bladder surg  1992   bladder retraction   BREAST BIOPSY     BREAST REDUCTION SURGERY  1984   CARDIAC CATHETERIZATION  1990's   [name]   COLON SURGERY  05/11/2011   [facility] of adhesions Crohn's   MASS EXCISION Left 01/03/2018   Procedure: EXCISION LEFT UPPER BACK LIPOMA ERAS PATH;  Surgeon: Vanderbilt Ned, MD;  Location: [facility];  Service: General;  Laterality: Left;   REDUCTION MAMMAPLASTY     SPINE SURGERY     5 total (1 upper, 4 lumbar)   SPINE SURGERY     nerve stimulator on right side   wrist and hand Right 07/2021   Dr. Oneil Herald   Past Medical History:  Diagnosis Date   Anemia    Blood transfusion without reported diagnosis    Crohn's disease (HCC)    Disorder of sacroiliac joint    Exertional dyspnea 05/28/2021   Hyperlipidemia    Hypertension    Ischemic colitis    Lumbago    Lumbar post-laminectomy syndrome    Lumbosacral neuritis    Lumbosacral radiculitis    OSA (obstructive sleep apnea) 05/28/2021   Sciatica    BP 123/80   Pulse (!) 59   Ht 5' 4 (1.626 m)   Wt 209 lb (94.8 kg)   SpO2 98%   BMI 35.87 kg/m   Opioid Risk Score:   Fall Risk Score:   `1  Depression screen [facility] 2/9  03/20/2024    8:46 AM 02/22/2024    8:22 AM 02/21/2024    9:14 AM 01/11/2024   10:02 AM 12/15/2023    8:53 AM 12/15/2023    8:45 AM 11/30/2023   10:36 AM  Depression screen PHQ 2/9  Decreased Interest 0 0 0 0 0 0 1  Down, Depressed, Hopeless 0 0 0 0 0 0 0  PHQ - 2 Score 0 0 0 0 0 0 1  Altered sleeping  0   1 0 1  Tired, decreased energy  1   3 0 2  Change in appetite  1   3 0 3  Feeling bad or failure about yourself   0   0 0 0  Trouble concentrating  0   0 0 0  Moving slowly or fidgety/restless  0   0 0 0  Suicidal thoughts  0   0 0 0  PHQ-9 Score  2   7 0 7  Difficult doing work/chores  Not difficult at all          Review of Systems  Musculoskeletal:  Positive for back pain.       Right shoulder pain  All other systems reviewed and are negative.      Objective:   Physical Exam Vitals and nursing note reviewed.  Constitutional:      Appearance: Normal appearance.  Cardiovascular:     Rate and Rhythm: Normal rate and regular rhythm.     Pulses: Normal pulses.     Heart sounds: Normal heart sounds.  Pulmonary:     Effort: Pulmonary effort is normal.     Breath sounds: Normal breath sounds.  Musculoskeletal:     Comments: Normal Muscle Bulk and Muscle Testing Reveals:  Upper Extremities: Right: Decreased ROM 90 Degrees  and Muscle Strength 5/5 Right AC Joint Tenderness Left Upper Extremity: Full ROM and Muscle Strength 5/5 Lumbar Paraspinal Tenderness: L-4-L-5 Lower Extremities: Full ROM and Muscle Strength 5/5 Arises from Table slowly Narrow Based  Gait     Skin:    General: Skin is warm and dry.  Neurological:     Mental Status: She is alert and oriented to person, place, and time.  Psychiatric:        Mood and Affect: Mood normal.        Behavior: Behavior normal.          Assessment & Plan:  1.Lumbar Postlaminectomy/ Spinal Stenosis Lumbar Region/ Post-Op Pain/Low back pain with radiating symptoms in L3-4 distribution:   continues to be limited by pain. Refilled:  Oxycodone  10 mg one tablet twice a day as needed for moderate to sever pain #60 and  Morphine  Sulfate ER 15 mg Q 8 hours. 04/17/2024. We will continue the opioid monitoring program, this consists of regular clinic visits, examinations, urine drug screen, pill counts as well as use of [place]  Controlled Substance Reporting system. A 12 month History has been reviewed on the   Controlled Substance Reporting System on 11/15/2023. Encouraged to Continue exercise regime.   2. Lumbar Radiculopathy: Continue Topamax :.Continue to Monitor 04/17/2024 3.Chronic  Right Knee Pain: No complaints today.  Continue with Ice Therapy and  Voltaren  Gel. 04/17/2024 4. Muscle Spasm: Continue current treatment: Tizanidine . 04/17/2024  5. Lipoma: S/P Left  Lipoma Excision of upper back  By Dr. Vanderbilt on 01/03/2018. Surgery Following. Ms. Hams has a  scheduled an appointment with Dr Vanderbilt today , to evaluate Lipoma,. Continue to Monitor.04/17/2024.   6. Paresthesia of  Skin: Continue Topamax . Continue to monitor. 04/17/2024 7. Adhesive Capsulitis of Right Shoulder:Right Shoulder Tendonitis Pain: . S/P Right Shoulder Cortisone Injection with Dr Carilyn,  no relief noted. Continue to monitor. Continue HEP as Tolerated. 04/17/2024  8. Polyarthralgia: Continue HEP as Tolerated. Continue to Monitor.04/17/2024  9. Sacral Pain: No complaints today. Continue to Monitor. 04/17/2024 10. Right Hand Pain: No complaints today. S/P :Post right middle finger trigger finger release carpal tunnel release. Dr Barbarann Following.  Continue to monitor. 04/17/2024  11. Left hand Pain : No complaints today. Left Index Trigger Finger: Dr Barbarann following. 04/17/2024   F/U in 1 month

## 2024-05-04 ENCOUNTER — Other Ambulatory Visit: Payer: Self-pay | Admitting: Student

## 2024-05-04 DIAGNOSIS — E119 Type 2 diabetes mellitus without complications: Secondary | ICD-10-CM

## 2024-05-04 MED FILL — Chlorthalidone Tab 25 MG: ORAL | 90 days supply | Qty: 90 | Fill #1 | Status: AC

## 2024-05-04 MED FILL — Semaglutide Soln Pen-inj 1 MG/DOSE (4 MG/3ML): SUBCUTANEOUS | 28 days supply | Qty: 3 | Fill #1 | Status: AC

## 2024-05-05 ENCOUNTER — Other Ambulatory Visit: Payer: Self-pay | Admitting: Student

## 2024-05-05 ENCOUNTER — Other Ambulatory Visit: Payer: Self-pay

## 2024-05-05 DIAGNOSIS — E119 Type 2 diabetes mellitus without complications: Secondary | ICD-10-CM

## 2024-05-06 ENCOUNTER — Other Ambulatory Visit: Payer: Self-pay

## 2024-05-06 MED FILL — Losartan Potassium Tab 25 MG: ORAL | 60 days supply | Qty: 30 | Fill #0 | Status: AC

## 2024-05-06 MED FILL — Ondansetron Orally Disintegrating Tab 4 MG: ORAL | 7 days supply | Qty: 20 | Fill #0 | Status: AC

## 2024-05-07 ENCOUNTER — Other Ambulatory Visit: Payer: Self-pay

## 2024-05-17 ENCOUNTER — Other Ambulatory Visit: Payer: Self-pay | Admitting: Student

## 2024-05-17 ENCOUNTER — Other Ambulatory Visit: Payer: Self-pay

## 2024-05-18 ENCOUNTER — Other Ambulatory Visit: Payer: Self-pay

## 2024-05-20 ENCOUNTER — Encounter: Admitting: Registered Nurse

## 2024-05-20 ENCOUNTER — Other Ambulatory Visit: Payer: Self-pay

## 2024-05-20 MED ORDER — LIDOCAINE 5 % EX PTCH
1.0000 | MEDICATED_PATCH | CUTANEOUS | 0 refills | Status: DC
  Filled 2024-05-20: qty 30, 30d supply, fill #0

## 2024-06-01 MED FILL — Atorvastatin Calcium Tab 80 MG (Base Equivalent): ORAL | 90 days supply | Qty: 90 | Fill #2 | Status: AC

## 2024-06-03 MED FILL — Semaglutide Soln Pen-inj 1 MG/DOSE (4 MG/3ML): SUBCUTANEOUS | 28 days supply | Qty: 3 | Fill #2 | Status: AC

## 2024-06-04 ENCOUNTER — Other Ambulatory Visit: Payer: Self-pay | Admitting: Student

## 2024-06-04 DIAGNOSIS — F320 Major depressive disorder, single episode, mild: Secondary | ICD-10-CM

## 2024-06-05 ENCOUNTER — Other Ambulatory Visit: Payer: Self-pay | Admitting: Student

## 2024-06-05 ENCOUNTER — Telehealth: Payer: Self-pay | Admitting: Registered Nurse

## 2024-06-05 ENCOUNTER — Other Ambulatory Visit: Payer: Self-pay

## 2024-06-05 ENCOUNTER — Other Ambulatory Visit: Payer: Self-pay | Admitting: Registered Nurse

## 2024-06-05 DIAGNOSIS — M4807 Spinal stenosis, lumbosacral region: Secondary | ICD-10-CM

## 2024-06-05 DIAGNOSIS — G894 Chronic pain syndrome: Secondary | ICD-10-CM

## 2024-06-05 DIAGNOSIS — F320 Major depressive disorder, single episode, mild: Secondary | ICD-10-CM

## 2024-06-05 MED ORDER — ESCITALOPRAM OXALATE 5 MG PO TABS
5.0000 mg | ORAL_TABLET | Freq: Every day | ORAL | 0 refills | Status: AC
  Filled 2024-06-05 – 2024-06-19 (×2): qty 60, 60d supply, fill #0

## 2024-06-05 MED ORDER — OXYCODONE HCL 10 MG PO TABS: 10.0000 mg | ORAL_TABLET | Freq: Two times a day (BID) | ORAL | 0 refills | Status: DC | PRN

## 2024-06-05 MED ORDER — MORPHINE SULFATE ER 15 MG PO TBCR: 15.0000 mg | EXTENDED_RELEASE_TABLET | Freq: Three times a day (TID) | ORAL | 0 refills | Status: DC

## 2024-06-05 NOTE — Telephone Encounter (Signed)
 PDMP was Reviewed,  MS Contin  and Oxycodone  e-scribed to pharmacy.  Call placed to Ms. Moman no answer, left message to return the call.

## 2024-06-05 NOTE — Telephone Encounter (Signed)
 Requested Prescriptions   Pending Prescriptions Disp Refills   Oxycodone  HCl 10 MG TABS 60 tablet 0    Sig: Take 1 tablet (10 mg total) by mouth 2 (two) times daily as needed.   morphine  (MS CONTIN ) 15 MG 12 hr tablet 90 tablet 0    Sig: Take 1 tablet (15 mg total) by mouth every 8 (eight) hours. Do not Fill Before 05/05/2024     Date of patient request: 06/04/2024 Last office visit: 04/17/2024 Upcoming visit: 06/19/2024 Date of last refill: 04/17/2024 Last refill amount: #60 and #90 medication will run out before her follow up on 12/31

## 2024-06-17 ENCOUNTER — Other Ambulatory Visit: Payer: Self-pay

## 2024-06-19 ENCOUNTER — Encounter: Admitting: Registered Nurse

## 2024-06-19 ENCOUNTER — Other Ambulatory Visit: Payer: Self-pay

## 2024-06-19 ENCOUNTER — Other Ambulatory Visit: Payer: Self-pay | Admitting: Student

## 2024-06-19 MED ORDER — LIDOCAINE 5 % EX PTCH
1.0000 | MEDICATED_PATCH | CUTANEOUS | 0 refills | Status: DC
  Filled 2024-06-19: qty 30, 30d supply, fill #0

## 2024-06-19 NOTE — Progress Notes (Deleted)
 "  Subjective:    Patient ID: [name] [name], female    DOB: [birth year], 68 y.o.   MRN: [id number]  HPI   Pain Inventory Average Pain {NUMBERS; 0-10:5044} Pain Right Now {NUMBERS; 0-10:5044} My pain is {PAIN DESCRIPTION:21022940}  In the last 24 hours, has pain interfered with the following? General activity {NUMBERS; 0-10:5044} Relation with others {NUMBERS; 0-10:5044} Enjoyment of life {NUMBERS; 0-10:5044} What TIME of day is your pain at its worst? {time of day:24191} Sleep (in general) {BHH GOOD/FAIR/POOR:22877}  Pain is worse with: {ACTIVITIES:21022942} Pain improves with: {PAIN IMPROVES TPUY:78977056} Relief from Meds: {NUMBERS; 0-10:5044}  Family History  Problem Relation Age of Onset   Hyperlipidemia Mother    Heart attack Mother    Heart disease Mother 87       CHF; CAD   Hypertension Mother    Heart failure Mother    Diabetes Sister    Lupus Sister    Kidney failure Sister    Hyperlipidemia Sister    Hypertension Sister    Stroke Maternal Aunt    Hypertension Brother    Breast cancer Other    Colon cancer Neg Hx    Rectal cancer Neg Hx    Social History   Socioeconomic History   Marital status: Widowed    Spouse name: Not on file   Number of children: 3   Years of education: Not on file   Highest education level: 9th grade  Occupational History   Occupation: disability    Comment: 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  Tobacco Use   Smoking status: Former    Current packs/day: 0.00    Average packs/day: 0.5 packs/day for 20.0 years (10.0 ttl pk-yrs)    Types: Cigarettes    Start date: 07/21/1988    Quit date: 07/21/2008    Years since quitting: 15.9    Passive exposure: Past   Smokeless tobacco: Never  Vaping Use   Vaping status: Never Used  Substance and Sexual Activity   Alcohol  use: No   Drug use: No   Sexual activity: Not on file  Other Topics Concern   Not on file  Social History Narrative   Marital status: widowed since 2002; not dating which is  bad in 2018      Children: 3 children (45, 14, 40); 13 grandchildren; 1 gg      Employment: disability since 2009; retail      Lives: alone in [place] in apartment      Tobacco: quit in 2011      Alcohol : none      Drugs: none      Exercise: minimal in 2018      ADLs: independent with ADLs; drives      Seatbelt: 100%; no texting.   Social Drivers of Health   Tobacco Use: Medium Risk (03/20/2024)   Patient History    Smoking Tobacco Use: Former    Smokeless Tobacco Use: Never    Passive Exposure: Past  Physicist, Medical Strain: Medium Risk (12/14/2023)   Overall Financial Resource Strain (CARDIA)    Difficulty of Paying Living Expenses: Somewhat hard  Food Insecurity: No Food Insecurity (12/14/2023)   Epic    Worried About Programme Researcher, Broadcasting/film/video in the Last Year: Never true    Ran Out of Food in the Last Year: Never true  Transportation Needs: No Transportation Needs (12/14/2023)   Epic    Lack of Transportation (Medical): No    Lack of Transportation (Non-Medical): No  Physical Activity: Inactive (  12/14/2023)   Exercise Vital Sign    Days of Exercise per Week: 0 days    Minutes of Exercise per Session: Not on file  Stress: Stress Concern Present (12/14/2023)   [employer] of Occupational Health - Occupational Stress Questionnaire    Feeling of Stress: Very much  Social Connections: Moderately Integrated (12/14/2023)   Social Connection and Isolation Panel    Frequency of Communication with Friends and Family: Twice a week    Frequency of Social Gatherings with Friends and Family: Twice a week    Attends Religious Services: More than 4 times per year    Active Member of [employer] or Organizations: Yes    Attends Banker Meetings: More than 4 times per year    Marital Status: Widowed  Depression (PHQ2-9): Low Risk (03/20/2024)   Depression (PHQ2-9)    PHQ-2 Score: 0  Alcohol  Screen: Low Risk (10/26/2023)   Alcohol  Screen    Last Alcohol  Screening Score (AUDIT): 0   Housing: Low Risk (12/14/2023)   Epic    Unable to Pay for Housing in the Last Year: No    Number of Times Moved in the Last Year: 0    Homeless in the Last Year: No  Utilities: Not At Risk (10/26/2023)   A[employer] Utilities    Threatened with loss of utilities: No  Health Literacy: Adequate Health Literacy (10/26/2023)   B1300 Health Literacy    Frequency of need for help with medical instructions: Never   Past Surgical History:  Procedure Laterality Date   ABDOMINAL HYSTERECTOMY  1982   DUB; ovaries intact   bladder surg  1992   bladder retraction   BREAST BIOPSY     BREAST REDUCTION SURGERY  1984   CARDIAC CATHETERIZATION  1990's   [name]   COLON SURGERY  05/11/2011   [facility] of adhesions Crohn's   MASS EXCISION Left 01/03/2018   Procedure: EXCISION LEFT UPPER BACK LIPOMA ERAS PATH;  Surgeon: Vanderbilt Ned, MD;  Location: [facility];  Service: General;  Laterality: Left;   REDUCTION MAMMAPLASTY     SPINE SURGERY     5 total (1 upper, 4 lumbar)   SPINE SURGERY     nerve stimulator on right side   wrist and hand Right 07/2021   Dr. Oneil Herald   Past Surgical History:  Procedure Laterality Date   ABDOMINAL HYSTERECTOMY  1982   DUB; ovaries intact   bladder surg  1992   bladder retraction   BREAST BIOPSY     BREAST REDUCTION SURGERY  1984   CARDIAC CATHETERIZATION  1990's   [name]   COLON SURGERY  05/11/2011   [facility] of adhesions Crohn's   MASS EXCISION Left 01/03/2018   Procedure: EXCISION LEFT UPPER BACK LIPOMA ERAS PATH;  Surgeon: Vanderbilt Ned, MD;  Location: [facility];  Service: General;  Laterality: Left;   REDUCTION MAMMAPLASTY     SPINE SURGERY     5 total (1 upper, 4 lumbar)   SPINE SURGERY     nerve stimulator on right side   wrist and hand Right 07/2021   Dr. Oneil Herald   Past Medical History:  Diagnosis Date   Anemia    Blood transfusion without reported diagnosis    Crohn's disease (HCC)    Disorder of  sacroiliac joint    Exertional dyspnea 05/28/2021   Hyperlipidemia    Hypertension    Ischemic colitis    Lumbago  Lumbar post-laminectomy syndrome    Lumbosacral neuritis    Lumbosacral radiculitis    OSA (obstructive sleep apnea) 05/28/2021   Sciatica    There were no vitals taken for this visit.  Opioid Risk Score:   Fall Risk Score:  `1  Depression screen [facility] 2/9     03/20/2024    8:46 AM 02/22/2024    8:22 AM 02/21/2024    9:14 AM 01/11/2024   10:02 AM 12/15/2023    8:53 AM 12/15/2023    8:45 AM 11/30/2023   10:36 AM  Depression screen PHQ 2/9  Decreased Interest 0 0 0 0 0 0 1  Down, Depressed, Hopeless 0 0 0 0 0 0 0  PHQ - 2 Score 0 0 0 0 0 0 1  Altered sleeping  0   1 0 1  Tired, decreased energy  1   3 0 2  Change in appetite  1   3 0 3  Feeling bad or failure about yourself   0   0 0 0  Trouble concentrating  0   0 0 0  Moving slowly or fidgety/restless  0   0 0 0  Suicidal thoughts  0   0 0 0  PHQ-9 Score  2    7  0  7   Difficult doing work/chores  Not difficult at all          Data saved with a previous flowsheet row definition    Review of Systems     Objective:   Physical Exam        Assessment & Plan:    "

## 2024-06-27 ENCOUNTER — Encounter: Attending: Physical Medicine & Rehabilitation | Admitting: Registered Nurse

## 2024-06-27 ENCOUNTER — Encounter: Payer: Self-pay | Admitting: Registered Nurse

## 2024-06-27 VITALS — BP 145/83 | HR 66 | Ht 64.0 in | Wt 203.0 lb

## 2024-06-27 DIAGNOSIS — Z5181 Encounter for therapeutic drug level monitoring: Secondary | ICD-10-CM | POA: Insufficient documentation

## 2024-06-27 DIAGNOSIS — G894 Chronic pain syndrome: Secondary | ICD-10-CM | POA: Diagnosis not present

## 2024-06-27 DIAGNOSIS — M7581 Other shoulder lesions, right shoulder: Secondary | ICD-10-CM | POA: Diagnosis not present

## 2024-06-27 DIAGNOSIS — Z79891 Long term (current) use of opiate analgesic: Secondary | ICD-10-CM | POA: Diagnosis not present

## 2024-06-27 DIAGNOSIS — M4807 Spinal stenosis, lumbosacral region: Secondary | ICD-10-CM | POA: Insufficient documentation

## 2024-06-27 MED ORDER — MORPHINE SULFATE ER 15 MG PO TBCR: 15.0000 mg | EXTENDED_RELEASE_TABLET | Freq: Three times a day (TID) | ORAL | 0 refills | Status: AC

## 2024-06-27 MED ORDER — OXYCODONE HCL 10 MG PO TABS: 10.0000 mg | ORAL_TABLET | Freq: Two times a day (BID) | ORAL | 0 refills | Status: AC | PRN

## 2024-06-27 NOTE — Progress Notes (Signed)
 "  Subjective:    Patient ID: [name] [name], female    DOB: 03/26/1956, 69 y.o.   MRN: [id number]  HPI: [name] [name] is a 69 y.o. female who returns for follow up appointment for chronic pain and medication refill. She states her pain is located in her right shoulder and lower back. She rates her pain 9. Her current exercise regime is walking and performing stretching exercises.  [name] Morphine  equivalent is 75.00 MME.   Oral Swab was Performed today.    Pain Inventory Average Pain 9 Pain Right Now 9 My pain is intermittent, constant, sharp, and stabbing  In the last 24 hours, has pain interfered with the following? General activity 9 Relation with others 9 Enjoyment of life 9 What TIME of day is your pain at its worst? morning , daytime, evening, and night Sleep (in general) NA  Pain is worse with: walking, bending, inactivity, standing, and some activites Pain improves with: rest, heat/ice, and medication Relief from Meds: 1  Family History  Problem Relation Age of Onset   Hyperlipidemia Mother    Heart attack Mother    Heart disease Mother 69       CHF; CAD   Hypertension Mother    Heart failure Mother    Diabetes Sister    Lupus Sister    Kidney failure Sister    Hyperlipidemia Sister    Hypertension Sister    Stroke Maternal Aunt    Hypertension Brother    Breast cancer Other    Colon cancer Neg Hx    Rectal cancer Neg Hx    Social History   Socioeconomic History   Marital status: Widowed    Spouse name: Not on file   Number of children: 3   Years of education: Not on file   Highest education level: 9th grade  Occupational History   Occupation: disability    Comment: 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  Tobacco Use   Smoking status: Former    Current packs/day: 0.00    Average packs/day: 0.5 packs/day for 20.0 years (10.0 ttl pk-yrs)    Types: Cigarettes    Start date: 07/21/1988    Quit date: 07/21/2008    Years since quitting: 15.9    Passive exposure:  Past   Smokeless tobacco: Never  Vaping Use   Vaping status: Never Used  Substance and Sexual Activity   Alcohol  use: No   Drug use: No   Sexual activity: Not on file  Other Topics Concern   Not on file  Social History Narrative   Marital status: widowed since 2002; not dating which is bad in 2018      Children: 3 children (45, 33, 40); 13 grandchildren; 1 gg      Employment: disability since 2009; retail      Lives: alone in [place] in apartment      Tobacco: quit in 2011      Alcohol : none      Drugs: none      Exercise: minimal in 2018      ADLs: independent with ADLs; drives      Seatbelt: 100%; no texting.   Social Drivers of Health   Tobacco Use: Medium Risk (03/20/2024)   Patient History    Smoking Tobacco Use: Former    Smokeless Tobacco Use: Never    Passive Exposure: Past  Physicist, Medical Strain: Medium Risk (12/14/2023)   Overall Financial Resource Strain (CARDIA)    Difficulty of Paying Living Expenses:  Somewhat hard  Food Insecurity: No Food Insecurity (12/14/2023)   Epic    Worried About Programme Researcher, Broadcasting/film/video in the Last Year: Never true    Ran Out of Food in the Last Year: Never true  Transportation Needs: No Transportation Needs (12/14/2023)   Epic    Lack of Transportation (Medical): No    Lack of Transportation (Non-Medical): No  Physical Activity: Inactive (12/14/2023)   Exercise Vital Sign    Days of Exercise per Week: 0 days    Minutes of Exercise per Session: Not on file  Stress: Stress Concern Present (12/14/2023)   [employer] of Occupational Health - Occupational Stress Questionnaire    Feeling of Stress: Very much  Social Connections: Moderately Integrated (12/14/2023)   Social Connection and Isolation Panel    Frequency of Communication with Friends and Family: Twice a week    Frequency of Social Gatherings with Friends and Family: Twice a week    Attends Religious Services: More than 4 times per year    Active Member of [employer] or  Organizations: Yes    Attends Banker Meetings: More than 4 times per year    Marital Status: Widowed  Depression (PHQ2-9): Low Risk (03/20/2024)   Depression (PHQ2-9)    PHQ-2 Score: 0  Alcohol  Screen: Low Risk (10/26/2023)   Alcohol  Screen    Last Alcohol  Screening Score (AUDIT): 0  Housing: Low Risk (12/14/2023)   Epic    Unable to Pay for Housing in the Last Year: No    Number of Times Moved in the Last Year: 0    Homeless in the Last Year: No  Utilities: Not At Risk (10/26/2023)   [employer] Utilities    Threatened with loss of utilities: No  Health Literacy: Adequate Health Literacy (10/26/2023)   B1300 Health Literacy    Frequency of need for help with medical instructions: Never   Past Surgical History:  Procedure Laterality Date   ABDOMINAL HYSTERECTOMY  1982   DUB; ovaries intact   bladder surg  1992   bladder retraction   BREAST BIOPSY     BREAST REDUCTION SURGERY  1984   CARDIAC CATHETERIZATION  1990's   [name]   COLON SURGERY  05/11/2011   [facility] of adhesions Crohn's   MASS EXCISION Left 01/03/2018   Procedure: EXCISION LEFT UPPER BACK LIPOMA ERAS PATH;  Surgeon: Vanderbilt Ned, MD;  Location: [facility];  Service: General;  Laterality: Left;   REDUCTION MAMMAPLASTY     SPINE SURGERY     5 total (1 upper, 4 lumbar)   SPINE SURGERY     nerve stimulator on right side   wrist and hand Right 07/2021   Dr. Oneil Herald   Past Surgical History:  Procedure Laterality Date   ABDOMINAL HYSTERECTOMY  1982   DUB; ovaries intact   bladder surg  1992   bladder retraction   BREAST BIOPSY     BREAST REDUCTION SURGERY  1984   CARDIAC CATHETERIZATION  1990's   [name]   COLON SURGERY  05/11/2011   [facility] of adhesions Crohn's   MASS EXCISION Left 01/03/2018   Procedure: EXCISION LEFT UPPER BACK LIPOMA ERAS PATH;  Surgeon: Vanderbilt Ned, MD;  Location:  SURGERY CENTER;  Service: General;  Laterality: Left;   REDUCTION  MAMMAPLASTY     SPINE SURGERY     5 total (1 upper, 4 lumbar)   SPINE SURGERY     nerve stimulator on  right side   wrist and hand Right 07/2021   Dr. Oneil Herald   Past Medical History:  Diagnosis Date   Anemia    Blood transfusion without reported diagnosis    Crohn's disease [facility])    Disorder of sacroiliac joint    Exertional dyspnea 05/28/2021   Hyperlipidemia    Hypertension    Ischemic colitis    Lumbago    Lumbar post-laminectomy syndrome    Lumbosacral neuritis    Lumbosacral radiculitis    OSA (obstructive sleep apnea) 05/28/2021   Sciatica    BP (!) 145/83   Pulse 66   Ht 5' 4 (1.626 m)   Wt 203 lb (92.1 kg)   SpO2 95%   BMI 34.84 kg/m   Opioid Risk Score:   Fall Risk Score:  `1  Depression screen [facility] 2/9     06/27/2024    9:16 AM 03/20/2024    8:46 AM 02/22/2024    8:22 AM 02/21/2024    9:14 AM 01/11/2024   10:02 AM 12/15/2023    8:53 AM 12/15/2023    8:45 AM  Depression screen PHQ 2/9  Decreased Interest 0 0 0 0 0 0 0  Down, Depressed, Hopeless 0 0 0 0 0 0 0  PHQ - 2 Score 0 0 0 0 0 0 0  Altered sleeping   0   1 0  Tired, decreased energy   1   3 0  Change in appetite   1   3 0  Feeling bad or failure about yourself    0   0 0  Trouble concentrating   0   0 0  Moving slowly or fidgety/restless   0   0 0  Suicidal thoughts   0   0 0  PHQ-9 Score   2    7  0   Difficult doing work/chores   Not difficult at all         Data saved with a previous flowsheet row definition      Review of Systems  Musculoskeletal:  Positive for back pain.       Right shoulder  All other systems reviewed and are negative.      Objective:   Physical Exam Vitals and nursing note reviewed.  Constitutional:      Appearance: Normal appearance.  Cardiovascular:     Rate and Rhythm: Normal rate and regular rhythm.     Pulses: Normal pulses.     Heart sounds: Normal heart sounds.  Pulmonary:     Effort: Pulmonary effort is normal.     Breath sounds: Normal breath  sounds.  Musculoskeletal:     Comments: Normal Muscle Bulk and Muscle Testing Reveals:  Upper Extremities: Full ROM and Muscle Strength 5/5 Right AC Joint tenderness  Lumbar Paraspinal Tenderness: L-4-L-5 Lower Extremities: Full ROM and Muscle Strength 5/5 Arises from Table with Ease Narrow Based  Gait     Skin:    General: Skin is warm and dry.  Neurological:     Mental Status: She is alert and oriented to person, place, and time.  Psychiatric:        Mood and Affect: Mood normal.        Behavior: Behavior normal.          Assessment & Plan:  1.Lumbar Postlaminectomy/ Spinal Stenosis Lumbar Region/ Post-Op Pain/Low back pain with radiating symptoms in L3-4 distribution:  continues to be limited by pain. Refilled:  Oxycodone  10 mg one  tablet twice a day as needed for moderate to sever pain #60 and  Morphine  Sulfate ER 15 mg Q 8 hours. 06/27/2024. We will continue the opioid monitoring program, this consists of regular clinic visits, examinations, urine drug screen, pill counts as well as use of [place]  Controlled Substance Reporting system. A 12 month History has been reviewed on the [place]  Controlled Substance Reporting System on 11/15/2023. Encouraged to Continue exercise regime.   2. Lumbar Radiculopathy: Continue Topamax :.Continue to Monitor 06/27/2024 3.Chronic  Right Knee Pain: No complaints today.  Continue with Ice Therapy and  Voltaren  Gel. 06/27/2024 4. Muscle Spasm: Continue current treatment: Tizanidine . 06/27/2024  5. Lipoma: S/P Left  Lipoma Excision of upper back  By Dr. Vanderbilt on 01/03/2018. Surgery Following. Ms. Fulco has a  scheduled an appointment with Dr Vanderbilt today , to evaluate Lipoma,. Continue to Monitor.06/27/2024.   6. Paresthesia of Skin: Continue Topamax . Continue to monitor. 06/27/2024 7. Adhesive Capsulitis of Right Shoulder:Right Shoulder Tendonitis Pain: . S/P Right Shoulder Cortisone Injection with Dr Carilyn,  no relief noted.  Continue to monitor. Continue HEP as Tolerated. 06/27/2024  8. Polyarthralgia: Continue HEP as Tolerated. Continue to Monitor.06/27/2024  9. Sacral Pain: No complaints today. Continue to Monitor. 06/27/2024 10. Right Hand Pain: No complaints today. S/P :Post right middle finger trigger finger release carpal tunnel release. Dr Barbarann Following.  Continue to monitor. 06/27/2024  11. Left hand Pain : No complaints today. Left Index Trigger Finger: Dr Barbarann following. 06/27/2024   F/U in 1 month     "

## 2024-07-01 ENCOUNTER — Other Ambulatory Visit: Payer: Self-pay | Admitting: Student

## 2024-07-01 ENCOUNTER — Other Ambulatory Visit: Payer: Self-pay

## 2024-07-01 DIAGNOSIS — E119 Type 2 diabetes mellitus without complications: Secondary | ICD-10-CM

## 2024-07-02 ENCOUNTER — Other Ambulatory Visit: Payer: Self-pay

## 2024-07-02 LAB — DRUG TOX MONITOR 1 W/CONF, ORAL FLD
Amphetamines: NEGATIVE ng/mL
Barbiturates: NEGATIVE ng/mL
Benzodiazepines: NEGATIVE ng/mL
Buprenorphine: NEGATIVE ng/mL
Cocaine: NEGATIVE ng/mL
Codeine: NEGATIVE ng/mL
Dihydrocodeine: NEGATIVE ng/mL
Fentanyl: NEGATIVE ng/mL
Heroin Metabolite: NEGATIVE ng/mL
Hydrocodone: NEGATIVE ng/mL
Hydromorphone: NEGATIVE ng/mL
MARIJUANA: NEGATIVE ng/mL
MDMA: NEGATIVE ng/mL
Meprobamate: NEGATIVE ng/mL
Methadone: NEGATIVE ng/mL
Morphine: 15.7 ng/mL — ABNORMAL HIGH
Nicotine Metabolite: NEGATIVE ng/mL
Norhydrocodone: NEGATIVE ng/mL
Noroxycodone: 3.8 ng/mL — ABNORMAL HIGH
Opiates: POSITIVE ng/mL — AB
Oxycodone: 9 ng/mL — ABNORMAL HIGH
Oxymorphone: NEGATIVE ng/mL
Phencyclidine: NEGATIVE ng/mL
Tapentadol: NEGATIVE ng/mL
Tramadol: NEGATIVE ng/mL
Zolpidem: NEGATIVE ng/mL

## 2024-07-02 LAB — DRUG TOX ALC METAB W/CON, ORAL FLD: Alcohol Metabolite: NEGATIVE ng/mL

## 2024-07-03 ENCOUNTER — Other Ambulatory Visit: Payer: Self-pay

## 2024-07-03 MED FILL — Semaglutide Soln Pen-inj 1 MG/DOSE (4 MG/3ML): SUBCUTANEOUS | 28 days supply | Qty: 3 | Fill #0 | Status: AC

## 2024-07-06 ENCOUNTER — Other Ambulatory Visit: Payer: Self-pay

## 2024-07-08 ENCOUNTER — Other Ambulatory Visit: Payer: Self-pay

## 2024-07-08 ENCOUNTER — Other Ambulatory Visit (HOSPITAL_COMMUNITY): Payer: Self-pay

## 2024-07-08 ENCOUNTER — Other Ambulatory Visit: Payer: Self-pay | Admitting: Student

## 2024-07-08 DIAGNOSIS — E119 Type 2 diabetes mellitus without complications: Secondary | ICD-10-CM

## 2024-07-08 MED ORDER — LOSARTAN POTASSIUM 25 MG PO TABS
12.5000 mg | ORAL_TABLET | Freq: Every day | ORAL | 0 refills | Status: AC
  Filled 2024-07-08: qty 30, 60d supply, fill #0

## 2024-07-08 MED ORDER — ONDANSETRON 4 MG PO TBDP
4.0000 mg | ORAL_TABLET | Freq: Three times a day (TID) | ORAL | 0 refills | Status: AC | PRN
  Filled 2024-07-08: qty 20, 7d supply, fill #0

## 2024-07-08 MED FILL — Fluticasone Propionate Nasal Susp 50 MCG/ACT: NASAL | 90 days supply | Qty: 48 | Fill #1 | Status: AC

## 2024-07-18 ENCOUNTER — Other Ambulatory Visit: Payer: Self-pay | Admitting: Family Medicine

## 2024-07-19 ENCOUNTER — Other Ambulatory Visit: Payer: Self-pay

## 2024-07-19 MED ORDER — LIDOCAINE 5 % EX PTCH
1.0000 | MEDICATED_PATCH | CUTANEOUS | 0 refills | Status: AC
  Filled 2024-07-19: qty 30, 30d supply, fill #0

## 2024-08-01 ENCOUNTER — Encounter: Admitting: Registered Nurse

## 2024-10-31 ENCOUNTER — Encounter
